# Patient Record
Sex: Female | Born: 1940 | Race: White | Hispanic: No | Smoking: Never smoker
Health system: Southern US, Community
[De-identification: ages and names within clinical notes are randomized; demographics above are authoritative.]

## PROBLEM LIST (undated history)

## (undated) DIAGNOSIS — Z953 Presence of xenogenic heart valve: Secondary | ICD-10-CM

## (undated) DIAGNOSIS — I482 Chronic atrial fibrillation, unspecified: Secondary | ICD-10-CM

## (undated) DIAGNOSIS — T883XXA Malignant hyperthermia due to anesthesia, initial encounter: Secondary | ICD-10-CM

## (undated) DIAGNOSIS — J449 Chronic obstructive pulmonary disease, unspecified: Secondary | ICD-10-CM

## (undated) DIAGNOSIS — I350 Nonrheumatic aortic (valve) stenosis: Secondary | ICD-10-CM

## (undated) DIAGNOSIS — Z0389 Encounter for observation for other suspected diseases and conditions ruled out: Secondary | ICD-10-CM

## (undated) DIAGNOSIS — IMO0001 Reserved for inherently not codable concepts without codable children: Secondary | ICD-10-CM

## (undated) DIAGNOSIS — Z8489 Family history of other specified conditions: Secondary | ICD-10-CM

## (undated) DIAGNOSIS — J189 Pneumonia, unspecified organism: Secondary | ICD-10-CM

## (undated) DIAGNOSIS — J90 Pleural effusion, not elsewhere classified: Secondary | ICD-10-CM

## (undated) DIAGNOSIS — I272 Pulmonary hypertension, unspecified: Secondary | ICD-10-CM

## (undated) DIAGNOSIS — D649 Anemia, unspecified: Secondary | ICD-10-CM

## (undated) DIAGNOSIS — J3800 Paralysis of vocal cords and larynx, unspecified: Secondary | ICD-10-CM

## (undated) DIAGNOSIS — N179 Acute kidney failure, unspecified: Secondary | ICD-10-CM

## (undated) DIAGNOSIS — M1990 Unspecified osteoarthritis, unspecified site: Secondary | ICD-10-CM

## (undated) DIAGNOSIS — Z8709 Personal history of other diseases of the respiratory system: Secondary | ICD-10-CM

## (undated) DIAGNOSIS — I5032 Chronic diastolic (congestive) heart failure: Secondary | ICD-10-CM

## (undated) DIAGNOSIS — J398 Other specified diseases of upper respiratory tract: Secondary | ICD-10-CM

## (undated) HISTORY — PX: OTHER SURGICAL HISTORY: SHX169

---

## 2012-08-22 ENCOUNTER — Encounter (HOSPITAL_COMMUNITY): Payer: Self-pay

## 2012-08-22 ENCOUNTER — Emergency Department (HOSPITAL_COMMUNITY): Payer: Medicare Other

## 2012-08-22 ENCOUNTER — Inpatient Hospital Stay (HOSPITAL_COMMUNITY)
Admission: EM | Admit: 2012-08-22 | Discharge: 2012-08-27 | DRG: 291 | Disposition: A | Discharge status: Home / Self Care | Payer: Medicare Other | Attending: Internal Medicine | Admitting: Internal Medicine

## 2012-08-22 DIAGNOSIS — E669 Obesity, unspecified: Secondary | ICD-10-CM

## 2012-08-22 DIAGNOSIS — Z6841 Body Mass Index (BMI) 40.0 and over, adult: Secondary | ICD-10-CM

## 2012-08-22 DIAGNOSIS — R0600 Dyspnea, unspecified: Secondary | ICD-10-CM | POA: Diagnosis present

## 2012-08-22 DIAGNOSIS — J111 Influenza due to unidentified influenza virus with other respiratory manifestations: Secondary | ICD-10-CM | POA: Diagnosis present

## 2012-08-22 DIAGNOSIS — I509 Heart failure, unspecified: Secondary | ICD-10-CM | POA: Diagnosis present

## 2012-08-22 DIAGNOSIS — J019 Acute sinusitis, unspecified: Secondary | ICD-10-CM | POA: Diagnosis present

## 2012-08-22 DIAGNOSIS — I5031 Acute diastolic (congestive) heart failure: Principal | ICD-10-CM | POA: Diagnosis present

## 2012-08-22 DIAGNOSIS — I359 Nonrheumatic aortic valve disorder, unspecified: Secondary | ICD-10-CM | POA: Diagnosis present

## 2012-08-22 DIAGNOSIS — M069 Rheumatoid arthritis, unspecified: Secondary | ICD-10-CM | POA: Diagnosis present

## 2012-08-22 DIAGNOSIS — I2789 Other specified pulmonary heart diseases: Secondary | ICD-10-CM | POA: Diagnosis present

## 2012-08-22 DIAGNOSIS — J189 Pneumonia, unspecified organism: Secondary | ICD-10-CM

## 2012-08-22 DIAGNOSIS — J40 Bronchitis, not specified as acute or chronic: Secondary | ICD-10-CM | POA: Diagnosis present

## 2012-08-22 DIAGNOSIS — I517 Cardiomegaly: Secondary | ICD-10-CM

## 2012-08-22 DIAGNOSIS — I350 Nonrheumatic aortic (valve) stenosis: Secondary | ICD-10-CM

## 2012-08-22 DIAGNOSIS — J9801 Acute bronchospasm: Secondary | ICD-10-CM | POA: Diagnosis present

## 2012-08-22 DIAGNOSIS — I4891 Unspecified atrial fibrillation: Secondary | ICD-10-CM | POA: Diagnosis present

## 2012-08-22 DIAGNOSIS — I272 Pulmonary hypertension, unspecified: Secondary | ICD-10-CM

## 2012-08-22 HISTORY — DX: Unspecified osteoarthritis, unspecified site: M19.90

## 2012-08-22 LAB — BASIC METABOLIC PANEL
BUN: 10 mg/dL (ref 6–23)
CO2: 28 mEq/L (ref 19–32)
Calcium: 9.3 mg/dL (ref 8.4–10.5)
Chloride: 100 mEq/L (ref 96–112)
Creatinine, Ser: 0.65 mg/dL (ref 0.50–1.10)
GFR calc Af Amer: >90 mL/min (ref >90)
GFR calc non Af Amer: 88 mL/min — ABNORMAL LOW (ref >90)
Glucose, Bld: 149 mg/dL — ABNORMAL HIGH (ref 70–99)
Potassium: 3.6 mEq/L (ref 3.5–5.1)
Sodium: 138 mEq/L (ref 135–145)

## 2012-08-22 LAB — CBC WITH DIFFERENTIAL/PLATELET
Basophils Absolute: 0 10*3/uL (ref 0.0–0.1)
Basophils Relative: 0 % (ref 0–1)
Eosinophils Absolute: 0 10*3/uL (ref 0.0–0.7)
Eosinophils Relative: 0 % (ref 0–5)
HCT: 34.9 % — ABNORMAL LOW (ref 36.0–46.0)
Hemoglobin: 11.2 g/dL — ABNORMAL LOW (ref 12.0–15.0)
Lymphocytes Relative: 9 % — ABNORMAL LOW (ref 12–46)
Lymphs Abs: 0.7 10*3/uL (ref 0.7–4.0)
MCH: 29.6 pg (ref 26.0–34.0)
MCHC: 32.1 g/dL (ref 30.0–36.0)
MCV: 92.3 fL (ref 78.0–100.0)
Monocytes Absolute: 0.7 10*3/uL (ref 0.1–1.0)
Monocytes Relative: 9 % (ref 3–12)
Neutro Abs: 6.5 10*3/uL (ref 1.7–7.7)
Neutrophils Relative %: 81 % — ABNORMAL HIGH (ref 43–77)
Platelets: 168 10*3/uL (ref 150–400)
RBC: 3.78 MIL/uL — ABNORMAL LOW (ref 3.87–5.11)
RDW: 14.7 % (ref 11.5–15.5)
WBC: 8 10*3/uL (ref 4.0–10.5)

## 2012-08-22 LAB — TSH: TSH: 0.916 u[IU]/mL (ref 0.350–4.500)

## 2012-08-22 LAB — INFLUENZA PANEL BY PCR (TYPE A & B)
H1N1 flu by pcr: DETECTED — AB
Influenza A By PCR: POSITIVE — AB

## 2012-08-22 MED ORDER — ENOXAPARIN SODIUM 40 MG/0.4ML ~~LOC~~ SOLN
40.0000 mg | SUBCUTANEOUS | Status: DC
  Administered 2012-08-22 – 2012-08-23 (×2): 40 mg via SUBCUTANEOUS
  Filled 2012-08-22 (×2): qty 0.4

## 2012-08-22 MED ORDER — FUROSEMIDE 10 MG/ML IJ SOLN
40.0000 mg | Freq: Two times a day (BID) | INTRAMUSCULAR | Status: DC
  Administered 2012-08-22 – 2012-08-24 (×4): 40 mg via INTRAVENOUS
  Filled 2012-08-22 (×4): qty 4

## 2012-08-22 MED ORDER — SODIUM CHLORIDE 0.9 % IJ SOLN
3.0000 mL | Freq: Two times a day (BID) | INTRAMUSCULAR | Status: DC
  Administered 2012-08-22 – 2012-08-26 (×9): 3 mL via INTRAVENOUS
  Filled 2012-08-22: qty 3

## 2012-08-22 MED ORDER — POTASSIUM CHLORIDE CRYS ER 20 MEQ PO TBCR
40.0000 meq | EXTENDED_RELEASE_TABLET | Freq: Every day | ORAL | Status: DC
  Administered 2012-08-22 – 2012-08-27 (×6): 40 meq via ORAL
  Filled 2012-08-22 (×6): qty 2

## 2012-08-22 MED ORDER — ACETAMINOPHEN 325 MG PO TABS: 650.0000 mg | ORAL_TABLET | Freq: Four times a day (QID) | ORAL | Status: DC | PRN

## 2012-08-22 MED ORDER — LEVOFLOXACIN 500 MG PO TABS
500.0000 mg | ORAL_TABLET | Freq: Every day | ORAL | Status: DC
  Administered 2012-08-22 – 2012-08-26 (×5): 500 mg via ORAL
  Filled 2012-08-22 (×5): qty 1

## 2012-08-22 MED ORDER — ACETAMINOPHEN 650 MG RE SUPP: 650.0000 mg | Freq: Four times a day (QID) | RECTAL | Status: DC | PRN

## 2012-08-22 MED ORDER — FLUTICASONE PROPIONATE 50 MCG/ACT NA SUSP
2.0000 | Freq: Every day | NASAL | Status: DC
  Administered 2012-08-22 – 2012-08-27 (×6): 2 via NASAL
  Filled 2012-08-22: qty 16

## 2012-08-22 MED ORDER — IPRATROPIUM BROMIDE 0.02 % IN SOLN
0.5000 mg | Freq: Once | RESPIRATORY_TRACT | Status: AC
  Administered 2012-08-22: 0.5 mg via RESPIRATORY_TRACT
  Filled 2012-08-22: qty 2.5

## 2012-08-22 MED ORDER — PREDNISONE 50 MG PO TABS
60.0000 mg | ORAL_TABLET | Freq: Once | ORAL | Status: AC
  Administered 2012-08-22: 60 mg via ORAL
  Filled 2012-08-22: qty 1

## 2012-08-22 MED ORDER — FUROSEMIDE 10 MG/ML IJ SOLN
40.0000 mg | Freq: Once | INTRAMUSCULAR | Status: AC
  Administered 2012-08-22: 40 mg via INTRAVENOUS
  Filled 2012-08-22: qty 4

## 2012-08-22 MED ORDER — SENNOSIDES-DOCUSATE SODIUM 8.6-50 MG PO TABS: 1.0000 | ORAL_TABLET | Freq: Every evening | ORAL | Status: DC | PRN

## 2012-08-22 MED ORDER — TRAZODONE HCL 50 MG PO TABS
50.0000 mg | ORAL_TABLET | Freq: Every evening | ORAL | Status: DC | PRN
  Administered 2012-08-24: 50 mg via ORAL
  Filled 2012-08-22 (×3): qty 1

## 2012-08-22 MED ORDER — NITROGLYCERIN 2 % TD OINT
1.0000 [in_us] | TOPICAL_OINTMENT | Freq: Once | TRANSDERMAL | Status: AC
  Administered 2012-08-22: 1 [in_us] via TOPICAL
  Filled 2012-08-22: qty 1

## 2012-08-22 MED ORDER — PREDNISONE 20 MG PO TABS
40.0000 mg | ORAL_TABLET | Freq: Two times a day (BID) | ORAL | Status: DC
  Administered 2012-08-22 – 2012-08-27 (×11): 40 mg via ORAL
  Filled 2012-08-22 (×11): qty 2

## 2012-08-22 MED ORDER — ASPIRIN 81 MG PO CHEW
324.0000 mg | CHEWABLE_TABLET | Freq: Once | ORAL | Status: AC
  Administered 2012-08-22: 324 mg via ORAL
  Filled 2012-08-22: qty 4

## 2012-08-22 MED ORDER — OSELTAMIVIR PHOSPHATE 75 MG PO CAPS
75.0000 mg | ORAL_CAPSULE | Freq: Two times a day (BID) | ORAL | Status: AC
  Administered 2012-08-22 – 2012-08-27 (×10): 75 mg via ORAL
  Filled 2012-08-22 (×10): qty 1

## 2012-08-22 MED ORDER — BIOTENE DRY MOUTH MT LIQD
15.0000 mL | Freq: Two times a day (BID) | OROMUCOSAL | Status: DC
  Administered 2012-08-22 – 2012-08-26 (×7): 15 mL via OROMUCOSAL

## 2012-08-22 MED ORDER — ONDANSETRON HCL 4 MG PO TABS: 4.0000 mg | ORAL_TABLET | Freq: Four times a day (QID) | ORAL | Status: DC | PRN

## 2012-08-22 MED ORDER — LEVALBUTEROL HCL 0.63 MG/3ML IN NEBU
0.6300 mg | INHALATION_SOLUTION | RESPIRATORY_TRACT | Status: DC
  Administered 2012-08-22 – 2012-08-27 (×24): 0.63 mg via RESPIRATORY_TRACT
  Filled 2012-08-22 (×24): qty 3

## 2012-08-22 MED ORDER — ALBUTEROL SULFATE (5 MG/ML) 0.5% IN NEBU
5.0000 mg | INHALATION_SOLUTION | Freq: Once | RESPIRATORY_TRACT | Status: AC
  Administered 2012-08-22: 5 mg via RESPIRATORY_TRACT
  Filled 2012-08-22: qty 1

## 2012-08-22 MED ORDER — DM-GUAIFENESIN ER 30-600 MG PO TB12
1.0000 | ORAL_TABLET | Freq: Two times a day (BID) | ORAL | Status: DC
  Administered 2012-08-22 – 2012-08-27 (×10): 1 via ORAL
  Filled 2012-08-22 (×10): qty 1

## 2012-08-22 MED ORDER — ONDANSETRON HCL 4 MG/2ML IJ SOLN: 4.0000 mg | Freq: Four times a day (QID) | INTRAMUSCULAR | Status: DC | PRN

## 2012-08-22 NOTE — ED Provider Notes (Signed)
History  This chart was scribed for Sandra Gaskins, MD by Ardeen Jourdain, ED Scribe. This patient was seen in room APA04/APA04 and the patient's care was started at 0731.  CSN: [id number]  Arrival date & time 08/22/12  0725   First MD Initiated Contact with Patient 08/22/12 [phone]      Chief Complaint  Patient presents with  . Shortness of Breath     Patient is a 71 y.o. female presenting with shortness of breath. The history is provided by the patient and the EMS personnel. No language interpreter was used.  Shortness of Breath  The current episode started more than 1 week ago. The problem occurs frequently. The problem has been gradually worsening. The symptoms are relieved by rest. The symptoms are aggravated by activity. Associated symptoms include a fever, cough, shortness of breath and wheezing. Pertinent negatives include no chest pain and no chest pressure. Her temperature was unmeasured prior to arrival. The cough has no precipitants. The cough is productive. Nothing relieves the cough. Nothing worsens the cough. She was not exposed to toxic fumes. She has not inhaled smoke recently. She has had no prior steroid use. She has been behaving normally. Urine output has been normal. There were no sick contacts. She has received no recent medical care.   Sandra Turner is a 71 y.o. female brought in by ambulance, who presents to the Emergency Department complaining of gradually worsening SOB. She states she was at work yesterday when she noticed her cough and SOB begin to worsen. She states the cough started over 1 week ago for which she was seen by her PCP and was given albuterol and Azithromycin.   PMH - none  History reviewed. No pertinent past surgical history.  No family history on file.  History  Substance Use Topics  . Smoking status: Never Smoker   . Smokeless tobacco: Not on file  . Alcohol Use: No    OB History    Grav Para Term Preterm Abortions TAB SAB Ect Mult  Living                  Review of Systems  Constitutional: Positive for fever. Negative for diaphoresis and activity change.  Respiratory: Positive for cough, shortness of breath and wheezing.   Cardiovascular: Positive for leg swelling. Negative for chest pain.  Gastrointestinal: Negative for nausea, vomiting, abdominal pain and diarrhea.  All other systems reviewed and are negative.    Allergies  Penicillins  Home Medications  No current outpatient prescriptions on file.  Triage Vitals: BP 159/61  Pulse 111  Temp 98.6 F (37 C) (Oral)  Resp 20  Ht 5\' 2"  (1.575 m)  Wt 250 lb (113.399 kg)  BMI 45.73 kg/m2  SpO2 96%  Physical Exam  CONSTITUTIONAL: Well developed/well nourished HEAD AND FACE: Normocephalic/atraumatic EYES: EOMI/PERRL ENMT: Mucous membranes moist NECK: supple no meningeal signs SPINE:entire spine nontender CV: S1/S2 noted pitting edema bilaterally to lower extremities, systolic murmur noted LUNGS: Wheezing bilaterally, no apparent distress ABDOMEN: soft, nontender, no rebound or guarding GU:no cva tenderness NEURO: Pt is awake/alert, moves all extremitiesx4 EXTREMITIES: pulses normal, full ROM, LE edema noted SKIN: warm, color normal PSYCH: no abnormalities of mood noted  ED Course  Procedures   DIAGNOSTIC STUDIES: Oxygen Saturation is 96% on [place], adequate by my interpretation.    COORDINATION OF CARE:  7:44 AM: Discussed treatment plan which includes a CXR and breathing treatment with pt at bedside and pt agreed to plan.  8:00  AM: Medication Orders: albuterol (PROVENTIL) (5 MG/ML) 0.5% nebulizer solution 5 mg Once, ipratropium (ATROVENT) nebulizer solution 0.5 mg Once, predniSONE (DELTASONE) tablet 60 mg Once   8:25 AM CXR concerning for possible CHF.  She is short of breath.  bnp ordered.  NTG ordered as well 9:37 AM D/w dr Tenny Craw.  She feels patient can be kept at [employer], does not emergent cardiac cath for new onset CHF Pt stabilized in  the ED though still mildly tachypneic  D/w dr Lendell Caprice, to admit to tele   MDM  Nursing notes including past medical history and social history reviewed and considered in documentation Labs/vital reviewed and considered xrays reviewed and considered     Date: 08/22/2012  Rate: 90  Rhythm: atrial fibrillation  QRS Axis: left  Intervals: normal  ST/T Wave abnormalities: nonspecific ST changes  Conduction Disutrbances:none  Narrative Interpretation:   Old EKG Reviewed: none available at time of interpretation     I personally performed the services described in this documentation, which was scribed in my presence. The recorded information has been reviewed and is accurate.      Sandra Gaskins, MD 08/22/12 [phone]

## 2012-08-22 NOTE — Progress Notes (Signed)
Positive Flu screen, on call MD contacted.  New orders received.

## 2012-08-22 NOTE — Progress Notes (Signed)
*  PRELIMINARY RESULTS* Echocardiogram 2D Echocardiogram has been performed.  Conrad [place] 08/22/2012, 2:23 PM

## 2012-08-22 NOTE — ED Notes (Signed)
Pt via EMS with complaints of SOB since the previous night. 2 neb treatments given by EMS 1 albuterol and 1 duo neb. IV started to left hand.

## 2012-08-22 NOTE — H&P (Signed)
Hospital Admission Note Date: 08/22/2012  Patient name: Sandra Turner Medical record number: [id number] Date of birth: 1941/03/01 Age: 71 y.o. Gender: female PCP: [name], MD  Attending physician: Christiane Ha, MD  Chief Complaint: Shortness of breath  History of Present Illness:  Sandra Turner is an 71 y.o. female, previously healthy who presents with shortness of breath for about a week. Yesterday, she developed a cough productive of clear sputum. She has had maxillary sinus pressure, postnasal drip. She has had a backache and headache. No fevers or chills. She has no change in chronic peripheral edema. She has no orthopnea, or PND. The dyspnea is mainly exertional. She was given an inhaler last week which has not helped her symptoms. In the emergency room, chest x-ray showed cardiomegaly and possible pulmonary edema. Pro BNP was high. EKG showed atrial fibrillation. She was given steroids, bronchodilators, Lasix and aspirin. She has had similar symptoms with bronchitis in the past. No previous history of congestive heart failure or atrial fibrillation. No chest pains. Feels palpitations only with exertion.  History reviewed. No pertinent past medical history.  Meds: Prescriptions prior to admission  Medication Sig Dispense Refill  . Aspirin-Salicylamide-Caffeine (BC HEADACHE POWDER PO) Take 1 packet by mouth every 4 (four) hours as needed. Pain      . OVER THE COUNTER MEDICATION Take 1 tablet by mouth daily. Patient states that she takes about 30 multivitamins and herbal supplements.        Allergies: Penicillins and Latex History   Social History  . Marital Status: Divorced    Spouse Name: N/A    Number of Children: N/A  . Years of Education: N/A   Occupational History  . Not on file.   Social History Main Topics  . Smoking status: Never Smoker   . Smokeless tobacco: Not on file  . Alcohol Use: No  . Drug Use:   . Sexually Active:    Other Topics Concern  . Not  on file   Social History Narrative  . No narrative on file   Family history: Mother had congestive heart failure. Father had metastatic squamous cell carcinoma of the skin.   History reviewed. No pertinent past surgical history.  Review of Systems: Systems reviewed and as per HPI, otherwise negative.  Physical Exam: Blood pressure 124/58, pulse 83, temperature 98.6 F (37 C), temperature source Oral, resp. rate 22, height 5\' 2"  (1.575 m), weight 113.399 kg (250 lb), SpO2 93.00%. BP 124/58  Pulse 83  Temp 98.6 F (37 C) (Oral)  Resp 22  Ht 5\' 2"  (1.575 m)  Wt 113.399 kg (250 lb)  BMI 45.73 kg/m2  SpO2 93%  General Appearance:    Alert, cooperative, occasional wet cough. Eating a sandwich and watching TV.   Head:    Normocephalic, without obvious abnormality, atraumatic  Eyes:    PERRL, conjunctiva/corneas clear, EOM's intact, fundi    benign, both eyes  Ears:    Normal TM's and external ear canals, both ears  Nose:   Nares normal, septum midline, mucosa normal, no drainage    maxillary sinuses are tender.   Throat:   Lips, mucosa, and tongue normal; teeth and gums normal  Neck:   Supple, symmetrical, trachea midline, no adenopathy;    thyroid:  no enlargement/tenderness/nodules; no carotid   bruit or JVD  Back:     Symmetric, no curvature, ROM normal, no CVA tenderness  Lungs:     diminished throughout with prolonged expiratory phase, slight wheeze. No  rales. Occasional rhonchi.   Chest Wall:    No tenderness or deformity   Heart:    Regular rate and rhythm, S1 and S2 normal, no murmur, rub   or gallop     Abdomen:     Soft, non-tender, bowel sounds active all four quadrants,    no masses, no organomegaly. obese   Genitalia:   deferred   Rectal:   deferred   Extremities:   Extremities normal, atraumatic, no cyanosis.3+ pitting edema   Pulses:   2+ and symmetric all extremities  Skin:   Skin color, texture, turgor normal, no rashes or lesions  Lymph nodes:   Cervical,  supraclavicular, and axillary nodes normal  Neurologic:   CNII-XII intact, normal strength, sensation and reflexes    throughout    Psychiatric: Normal affect.  Lab results: Basic Metabolic Panel:  Basename 08/22/12 0720  NA 138  K 3.6  CL 100  CO2 28  GLUCOSE 149*  BUN 10  CREATININE 0.65  CALCIUM 9.3  MG --  PHOS --   Liver Function Tests: No results found for this basename: AST:2,ALT:2,ALKPHOS:2,BILITOT:2,PROT:2,ALBUMIN:2 in the last 72 hours No results found for this basename: LIPASE:2,AMYLASE:2 in the last 72 hours No results found for this basename: AMMONIA:2 in the last 72 hours CBC:  Basename 08/22/12 0720  WBC 8.0  NEUTROABS 6.5  HGB 11.2*  HCT 34.9*  MCV 92.3  PLT 168   Cardiac Enzymes:  Basename 08/22/12 0720  CKTOTAL --  CKMB --  CKMBINDEX --  TROPONINI <0.30   BNP:  [facility] 08/22/12 0720  PROBNP 1936.0*   Imaging results:  Dg Chest Port 1 View  08/22/2012  *RADIOLOGY REPORT*  Clinical Data: Cough, congestion, wheezing  PORTABLE CHEST - 1 VIEW  Comparison: Portable exam 0752 hours without priors for comparison  Findings: Enlargement of cardiac silhouette with pulmonary vascular congestion. Atherosclerotic calcification of a mildly tortuous thoracic aorta. Hilar enlargement bilaterally question adenopathy. Minimal subsegmental atelectasis at lingula. Lungs otherwise clear. No pleural effusion or pneumothorax. Bones demineralized with bilateral glenohumeral degenerative changes.  IMPRESSION: Enlargement of cardiac silhouette with pulmonary vascular congestion. Hilar enlargement bilaterally question adenopathy; recommend CT chest with contrast to evaluate. Minimal subsegmental atelectasis at lingula.   Original Report Authenticated By: Ulyses Southward, M.D.    RUE:AVWUJW fibrillation Left axis deviation Low voltage QRS  Assessment & Plan: Principal Problem:  *Dyspnea, likely a combination of acute bronchitis versus atypical pneumonia with bronchospasm  and new heart failure. Echocardiogram is pending. Will continue steroids, Lasix, check TSH, give levofloxacin. Give Xopenex. Mucinex. Daily weights. Monitor intake and output. Active Problems:  Bronchospasm: See above. Patient has no history of smoking.  Atrial fibrillation, new, rate controlled.. Chest score is 1. No need for anticoagulation at this point.  Acute sinusitis: Add Flonase  Obesity  Lakena Sparlin L 08/22/2012, 2:22 PM

## 2012-08-23 DIAGNOSIS — R0989 Other specified symptoms and signs involving the circulatory and respiratory systems: Secondary | ICD-10-CM

## 2012-08-23 DIAGNOSIS — R0609 Other forms of dyspnea: Secondary | ICD-10-CM

## 2012-08-23 DIAGNOSIS — E669 Obesity, unspecified: Secondary | ICD-10-CM

## 2012-08-23 LAB — GLUCOSE, CAPILLARY: Glucose-Capillary: 129 mg/dL — ABNORMAL HIGH (ref 70–99)

## 2012-08-23 LAB — BASIC METABOLIC PANEL
CO2: 31 mEq/L (ref 19–32)
Calcium: 9.3 mg/dL (ref 8.4–10.5)
GFR calc non Af Amer: 68 mL/min — ABNORMAL LOW (ref >90)
Glucose, Bld: 140 mg/dL — ABNORMAL HIGH (ref 70–99)
Potassium: 4.1 mEq/L (ref 3.5–5.1)
Sodium: 136 mEq/L (ref 135–145)

## 2012-08-23 NOTE — Progress Notes (Signed)
     Subjective: This lady feels somewhat better. Her breathing is easier. She is positive for influenza.           Physical Exam: Blood pressure 130/66, pulse 80, temperature 97.5 F (36.4 C), temperature source Oral, resp. rate 20, height 5\' 2"  (1.575 m), weight 120 kg (264 lb 8.8 oz), SpO2 97.00%. She looks like she is dyspneic at rest. She has audible wheezing from the bedside. Lung fields clearly show wheezing but just is not very tight. There are no crackles. There is no bronchial breathing. Heart sounds are present without aortic stenotic murmur. Jugular venous pressure is not particularly raised. She does have peripheral pitting edema. She is alert and orientated.   Investigations:     Basic Metabolic Panel:  Basename 08/23/12 0528 08/22/12 0720  NA 136 138  K 4.1 3.6  CL 98 100  CO2 31 28  GLUCOSE 140* 149*  BUN 17 10  CREATININE 0.85 0.65  CALCIUM 9.3 9.3  MG -- --  PHOS -- --       CBC:  Basename 08/22/12 0720  WBC 8.0  NEUTROABS 6.5  HGB 11.2*  HCT 34.9*  MCV 92.3  PLT 168    Dg Chest Port 1 View  08/22/2012  *RADIOLOGY REPORT*  Clinical Data: Cough, congestion, wheezing  PORTABLE CHEST - 1 VIEW  Comparison: Portable exam 0752 hours without priors for comparison  Findings: Enlargement of cardiac silhouette with pulmonary vascular congestion. Atherosclerotic calcification of a mildly tortuous thoracic aorta. Hilar enlargement bilaterally question adenopathy. Minimal subsegmental atelectasis at lingula. Lungs otherwise clear. No pleural effusion or pneumothorax. Bones demineralized with bilateral glenohumeral degenerative changes.  IMPRESSION: Enlargement of cardiac silhouette with pulmonary vascular congestion. Hilar enlargement bilaterally question adenopathy; recommend CT chest with contrast to evaluate. Minimal subsegmental atelectasis at lingula.   Original Report Authenticated By: Ulyses Southward, M.D.       Medications: I have reviewed the  patient's current medications.  Impression: 1. Influenza A. 2. Congestive heart failure, diastolic. 3. Moderate aortic stenosis. 4. Bronchitis. 5. Obesity. 6. Atrial fibrillation. No need for anticoagulation at this point.     Plan: 1. Continue with current therapy including intravenous Lasix, steroids, antibiotics. 2. She will need cardiology consultation after the weekend.     LOS: 1 day   Wilson Singer Pager [phone]  08/23/2012, 9:42 AM

## 2012-08-24 ENCOUNTER — Inpatient Hospital Stay (HOSPITAL_COMMUNITY): Payer: Medicare Other

## 2012-08-24 LAB — COMPREHENSIVE METABOLIC PANEL
AST: 43 U/L — ABNORMAL HIGH (ref 0–37)
BUN: 21 mg/dL (ref 6–23)
CO2: 32 mEq/L (ref 19–32)
Chloride: 96 mEq/L (ref 96–112)
Creatinine, Ser: 0.7 mg/dL (ref 0.50–1.10)
GFR calc non Af Amer: 86 mL/min — ABNORMAL LOW (ref >90)
Total Bilirubin: 0.5 mg/dL (ref 0.3–1.2)

## 2012-08-24 LAB — CBC
HCT: 37.5 % (ref 36.0–46.0)
Hemoglobin: 12.1 g/dL (ref 12.0–15.0)
MCV: 92.1 fL (ref 78.0–100.0)
RBC: 4.07 MIL/uL (ref 3.87–5.11)
WBC: 7.9 10*3/uL (ref 4.0–10.5)

## 2012-08-24 LAB — GLUCOSE, CAPILLARY: Glucose-Capillary: 126 mg/dL — ABNORMAL HIGH (ref 70–99)

## 2012-08-24 MED ORDER — FUROSEMIDE 10 MG/ML IJ SOLN
80.0000 mg | Freq: Three times a day (TID) | INTRAMUSCULAR | Status: DC
  Administered 2012-08-24 – 2012-08-26 (×6): 80 mg via INTRAVENOUS
  Filled 2012-08-24 (×6): qty 8

## 2012-08-24 MED ORDER — DILTIAZEM HCL 30 MG PO TABS
30.0000 mg | ORAL_TABLET | Freq: Two times a day (BID) | ORAL | Status: DC
  Administered 2012-08-24 (×2): 30 mg via ORAL
  Filled 2012-08-24 (×2): qty 1

## 2012-08-24 MED ORDER — ENOXAPARIN SODIUM 120 MG/0.8ML ~~LOC~~ SOLN
1.0000 mg/kg | Freq: Two times a day (BID) | SUBCUTANEOUS | Status: DC
  Administered 2012-08-24 – 2012-08-26 (×4): 120 mg via SUBCUTANEOUS
  Filled 2012-08-24 (×7): qty 0.8

## 2012-08-24 NOTE — Progress Notes (Addendum)
     Subjective: This lady feels somewhat better. Her breathing is easier. She is positive for influenza. She is coughing purulent sputum. She has not lost any significant weight whatsoever.           Physical Exam: Blood pressure 128/74, pulse 80, temperature 99 F (37.2 C), temperature source Oral, resp. rate 21, height 5\' 2"  (1.575 m), weight 120.2 kg (264 lb 15.9 oz), SpO2 96.00%. She looks like she is dyspneic at rest. She has audible wheezing from the bedside. Lung fields clearly show wheezing but  is not very tight. There are no crackles. There is no bronchial breathing. Heart sounds are present and clinically in atrial fibrillation with aortic stenotic murmur. Jugular venous pressure is not particularly raised. She does have peripheral pitting edema. She is alert and orientated.   Investigations:     Basic Metabolic Panel:  Basename 08/24/12 0635 08/23/12 0528  NA 135 136  K 4.2 4.1  CL 96 98  CO2 32 31  GLUCOSE 127* 140*  BUN 21 17  CREATININE 0.70 0.85  CALCIUM 9.4 9.3  MG -- --  PHOS -- --       CBC:  Basename 08/24/12 0635 08/22/12 0720  WBC 7.9 8.0  NEUTROABS -- 6.5  HGB 12.1 11.2*  HCT 37.5 34.9*  MCV 92.1 92.3  PLT 190 168    Dg Chest 1 View  08/24/2012  *RADIOLOGY REPORT*  Clinical Data: Follow up pulmonary vascular congestion.  CHEST - 1 VIEW  Comparison: Portable chest x-ray 08/24/2012.  Findings: Cardiac silhouette moderately enlarged but stable. Interval improvement in the pulmonary venous hypertension. Developing patchy airspace opacities medially at the right lung base.  Lungs otherwise clear.  IMPRESSION: Resolution of pulmonary venous hypertension.  Developing bronchopneumonia suspected at the right lung base.   Original Report Authenticated By: Hulan Saas, M.D.       Medications: I have reviewed the patient's current medications.  Impression: 1. Influenza A. 2. Acute diastolic congestive heart failure. 3. Moderate aortic  stenosis. 4. Bronchitis. 5. Obesity. 6. Atrial fibrillation.     Plan: 1. Increase Lasix to 80 mg 3 times a day. Start therapeutic dose of Lovenox for persistent atrial fibrillation. 2. She will need cardiology consultation after the weekend. 3. Continue with oral steroids and antibiotics and bronchodilators.     LOS: 2 days   Wilson Singer Pager [phone]  08/24/2012, 10:41 AM

## 2012-08-24 NOTE — Clinical Documentation Improvement (Signed)
CHF DOCUMENTATION CLARIFICATION QUERY  THIS DOCUMENT IS NOT A PERMANENT PART OF THE MEDICAL RECORD  TO RESPOND TO THE THIS QUERY, FOLLOW THE INSTRUCTIONS BELOW:  1. If needed, update documentation for the patient's encounter via the notes activity.  2. Access this query again and click edit on the In [employer].  3. After updating, or not, click F2 to complete all highlighted (required) fields concerning your review. Select "additional documentation in the medical record" OR "no additional documentation provided".  4. Click Sign note button.  5. The deficiency will fall out of your In Basket *Please let us know if you are not able to complete this workflow by phone or e-mail (listed below).  Please update your documentation within the medical record to reflect your response to this query.                                                                                    08/24/12  Dear Dr. [facility],  In a better effort to capture your patient's severity of illness, reflect appropriate length of stay and utilization of resources, a review of the patient medical record has revealed the following indicators the diagnosis of Heart Failure.  Based on your clinical judgment, please clarify and document in a progress note and/or discharge summary the clinical condition associated with the following supporting information:  In responding to this query please exercise your independent judgment.  The fact that a query is asked, does not imply that any particular answer is desired or expected.  Please clarify acuity of diagnosed Diastolic CHF  Possible Clinical Conditions?  Chronic Diastolic Congestive Heart Failure Acute Diastolic Congestive Heart Failure Acute Systolic & Diastolic Congestive Heart Failure Acute on Chronic Diastolic Congestive Heart Failure Other Condition________________________________________ Cannot Clinically Determine  Clinical Information:   Risk  Factors: Diagnosed with New onset Diastolic CHF  Signs & Symptoms: Peripheral edema SOB  Diagnostics: BNP: 1935  Echo results: EF: 55-60%  EKG: A Fib  Radiology: CXR- IMPRESSION: Enlargement of cardiac silhouette with pulmonary vascular congestion.  Treatment: Diuretics: IV Lasix 80mg  tid after meals Cardiac monitoring Daily weights  Reviewed: additional documentation in the medical record  Thank You,  Debora T Williams RN, MSN Clinical Documentation Specialist: Office# [phone] [facility] Information [facility] [place]

## 2012-08-24 NOTE — Progress Notes (Signed)
ANTICOAGULATION CONSULT NOTE - Initial Consult  Pharmacy Consult for Lovenox Indication: atrial fibrillation  Allergies  Allergen Reactions  . Penicillins Other (See Comments)    Unknown  . Latex Rash    Patient Measurements: Height: 5\' 2"  (157.5 cm) Weight: 264 lb 15.9 oz (120.2 kg) IBW/kg (Calculated) : 50.1   Vital Signs: Temp: 99 F (37.2 C) (12/22 0535) Temp src: Oral (12/22 0535) BP: 128/74 mmHg (12/22 0535) Pulse Rate: 80  (12/22 0703)  Labs:  Basename 08/24/12 1610 08/23/12 0528 08/22/12 0720  HGB 12.1 -- 11.2*  HCT 37.5 -- 34.9*  PLT 190 -- 168  APTT -- -- --  LABPROT -- -- --  INR -- -- --  HEPARINUNFRC -- -- --  CREATININE 0.70 0.85 0.65  CKTOTAL -- -- --  CKMB -- -- --  TROPONINI -- -- <0.30    Estimated Creatinine Clearance: 80.7 ml/min (by C-G formula based on Cr of 0.7).   Medical History: History reviewed. No pertinent past medical history.  Medications:  Scheduled:    . antiseptic oral rinse  15 mL Mouth Rinse BID  . dextromethorphan-guaiFENesin  1 tablet Oral BID  . diltiazem  30 mg Oral Q12H  . enoxaparin (LOVENOX) injection  1 mg/kg Subcutaneous Q12H  . fluticasone  2 spray Each Nare Daily  . furosemide  80 mg Intravenous TID PC  . levalbuterol  0.63 mg Nebulization Q4H while awake  . levofloxacin  500 mg Oral Daily  . oseltamivir  75 mg Oral BID  . potassium chloride  40 mEq Oral Daily  . predniSONE  40 mg Oral BID  . sodium chloride  3 mL Intravenous Q12H  . [DISCONTINUED] enoxaparin (LOVENOX) injection  40 mg Subcutaneous Q24H  . [DISCONTINUED] furosemide  40 mg Intravenous BID    Assessment: Okay for Protocol  Goal of Therapy:  Systemic Anticoagulation. Monitor platelets by anticoagulation protocol: Yes   Plan:  Lovenox 1mg /kg SQ every 12 hours. CBC every MWF. PT/INR with AM labs.  Lamonte Richer R 08/24/2012,10:51 AM

## 2012-08-25 ENCOUNTER — Encounter (HOSPITAL_COMMUNITY): Payer: Self-pay | Admitting: Adult Health

## 2012-08-25 DIAGNOSIS — I359 Nonrheumatic aortic valve disorder, unspecified: Secondary | ICD-10-CM

## 2012-08-25 DIAGNOSIS — I272 Pulmonary hypertension, unspecified: Secondary | ICD-10-CM

## 2012-08-25 DIAGNOSIS — I350 Nonrheumatic aortic (valve) stenosis: Secondary | ICD-10-CM

## 2012-08-25 DIAGNOSIS — I2789 Other specified pulmonary heart diseases: Secondary | ICD-10-CM

## 2012-08-25 LAB — CBC
MCV: 92.1 fL (ref 78.0–100.0)
Platelets: 204 10*3/uL (ref 150–400)
RBC: 4.04 MIL/uL (ref 3.87–5.11)
WBC: 6.1 10*3/uL (ref 4.0–10.5)

## 2012-08-25 LAB — BLOOD GAS, ARTERIAL
Acid-Base Excess: 13.7 mmol/L — ABNORMAL HIGH (ref 0.0–2.0)
Drawn by: 21310
O2 Content: 3 L/min
O2 Saturation: 96.4 %
Patient temperature: 37

## 2012-08-25 LAB — BASIC METABOLIC PANEL
BUN: 23 mg/dL (ref 6–23)
Chloride: 95 mEq/L — ABNORMAL LOW (ref 96–112)
GFR calc Af Amer: >90 mL/min (ref >90)
Potassium: 3.9 mEq/L (ref 3.5–5.1)

## 2012-08-25 LAB — D-DIMER, QUANTITATIVE: D-Dimer, Quant: 0.57 ug/mL-FEU — ABNORMAL HIGH (ref 0.00–0.48)

## 2012-08-25 LAB — GLUCOSE, CAPILLARY
Glucose-Capillary: 174 mg/dL — ABNORMAL HIGH (ref 70–99)
Glucose-Capillary: 140 mg/dL — ABNORMAL HIGH (ref 70–99)

## 2012-08-25 LAB — PROTIME-INR
INR: 1.21 (ref 0.00–1.49)
Prothrombin Time: 15.1 seconds (ref 11.6–15.2)

## 2012-08-25 MED ORDER — MONTELUKAST SODIUM 10 MG PO TABS
10.0000 mg | ORAL_TABLET | Freq: Every day | ORAL | Status: DC
  Administered 2012-08-25 – 2012-08-26 (×2): 10 mg via ORAL
  Filled 2012-08-25 (×2): qty 1

## 2012-08-25 MED ORDER — DILTIAZEM HCL 30 MG PO TABS
30.0000 mg | ORAL_TABLET | Freq: Four times a day (QID) | ORAL | Status: DC
  Administered 2012-08-25 – 2012-08-26 (×5): 30 mg via ORAL
  Filled 2012-08-25 (×5): qty 1

## 2012-08-25 NOTE — Care Management Note (Unsigned)
    Page 1 of 1   08/25/2012     2:35:16 PM   CARE MANAGEMENT NOTE 08/25/2012  Patient:  Sandra Turner, Sandra Turner   Account Number:  [id number]  Date Initiated:  08/25/2012  Documentation initiated by:  Sharrie Rothman  Subjective/Objective Assessment:   Pt admitted from home with CHF and flu. Pt lives alone. Pt is independent with ADL's.     Action/Plan:   No CM or HH needs noted.   Anticipated DC Date:  08/27/2012   Anticipated DC Plan:  HOME/SELF CARE      DC Planning Services  CM consult      Choice offered to / List presented to:             Status of service:  Completed, signed off Medicare Important Message given?   (If response is "NO", the following Medicare IM given date fields will be blank) Date Medicare IM given:   Date Additional Medicare IM given:    Discharge Disposition:    Per UR Regulation:    If discussed at Long Length of Stay Meetings, dates discussed:    Comments:  08/25/12 1430 Arlyss Queen, RN BSN CM

## 2012-08-25 NOTE — Consult Note (Signed)
CARDIOLOGY CONSULT NOTE  Patient ID: Sandra Turner MRN: [id number] DOB/AGE: 1941/01/28 71 y.o.  Admit date: 08/22/2012 Referring Physician: PTH-Gosrani Primary PhysicianBLUTH, Lyda Perone, MD Primary Cardiologist: New-Raiden Haydu Reason for Consultation:Atrial fibrillation. Pulmonary Edema Principal Problem:  *Dyspnea Active Problems:  Atrial fibrillation  Bronchospasm  Obesity  Acute sinusitis  HPI: Sandra Turner, is a  71 y/o morbidly obese female patient with no prior cardiac history. The patient has not see a physician regularly, but has recently been established with [facility] practice and seen by Mertha Baars PA. The patient was admitted with severe dyspnea, found to have possible pulmonary edema, and also atrial fibrillation. The patient states symptoms began just prior to Thanksgiving. She hurt her back and had been having trouble breathing ever since. Mostly taking deep breaths. She been followed by a chiropractor to assist with chronic back pain. Approximately 2 weeks ago her breathing issues worsen with wheezing, coughing, and dyspnea on exertion. She was seen at [facility] practice one week ago,  placed on inhaler and antibiotics. The patient states that the breathing status did not improve. She was noticing abdominal distention and tightness, unable to pull to walk 6 feet without having to stop secondary to significant dyspnea.    On arrival to [facility] ER, the patient's blood pressure was 159/61, heart rate irregular at 111 beats per minute, O2 sat 96% with temperature of 98.6.Pro-BNP was elevated at 1936.0 with CXR demonstrating pulmonary vascular congestion. EKG revealed atrial fibrillation with nonspecific changes anteriorly. Cardiac enzymes were negative., She was treated with albuterol and Atrovent nebulization, prednisone 60 mg by mouth, nitroglycerin ointment, aspirin, and furosemide 40 mg IV.    The patient has no previous history, has never been hospitalized, and states  that she takes no medications except for something for her arthritis. She is wearing a lot of copper bracelets, rings, and necklaces, which she states are helpful for her arthritis. She is unaware of any palpitations, heart racing, chest pain, denies dizziness, nausea or vomiting. Her main complaints are dyspnea on exertion and coughing.      Patient reports snoring intermittently, but no daytime somnolence.  Sleep apnea has never been raised as a possible diagnosis nor has she undergone a sleep study.      Review of systems complete and found to be negative unless listed above   Family History  Problem Relation Age of Onset  . Breast cancer Mother   . Congestive Heart Failure Mother   . Cancer Father     History   Social History  . Marital Status: Divorced    Spouse Name: N/A    Number of Children: N/A  . Years of Education: N/A   Occupational History  . Not on file.   Social History Main Topics  . Smoking status: Never Smoker   . Smokeless tobacco: Not on file  . Alcohol Use: No  . Drug Use: No  . Sexually Active: No   Other Topics Concern  . Not on file   Social History Narrative  . No narrative on file    Past Surgical History  Procedure Date  . None     Prescriptions prior to admission  Medication Sig Dispense Refill  . Aspirin-Salicylamide-Caffeine (BC HEADACHE POWDER PO) Take 1 packet by mouth every 4 (four) hours as needed. Pain      . OVER THE COUNTER MEDICATION Take 1 tablet by mouth daily. Patient states that she takes about 30 multivitamins and herbal supplements.  Physical Exam: Blood pressure 129/59, pulse 83, temperature 98.1 F (36.7 C), temperature source Oral, resp. rate 20, height 5\' 2"  (1.575 m), weight 264 lb 15.9 oz (120.2 kg), SpO2 87.00%. General: Well developed, well nourished, obese,  in no acute distress Head: Eyes PERRLA, No xanthomas.   Normal cephalic and atramatic  Lungs: marked inspiratory and expiratory  rales, wheezes, with  frequent coughing, nonproductive. Heart: HRIR S1 S2, 2/6 harsh systolic murmur,  Radiation to the carotids bilaterally. Heart sounds are obscured by lung sounds.  Pulses are 2+ & equal.  Positive bilateral carotid bruit. Neck obese, unable to evaluate JVD.  No abdominal bruits. No femoral bruits. Abdomen: Bowel sounds are positive, abdomen soft and non-tender without masses or                  Hernia's noted. Msk:  Back normal, normal gait. Normal strength and tone for age. Extremities: No clubbing, cyanosis, positive for  nonpitting pretibial edema.  DP +1 Neuro: Alert and oriented X 3. Psych:  Good affect, responds appropriately  Labs: Lab Results  Component Value Date   WBC 6.1 08/25/2012   HGB 11.9* 08/25/2012   HCT 37.2 08/25/2012   MCV 92.1 08/25/2012   PLT 204 08/25/2012    Lab 08/25/12 0523 08/24/12 0635  NA 139 --  K 3.9 --  CL 95* --  CO2 38* --  BUN 23 --  CREATININE 0.70 --  CALCIUM 9.2 --  PROT -- 7.3  BILITOT -- 0.5  ALKPHOS -- 104  ALT -- 38*  AST -- 43*  GLUCOSE 139* --   Lab Results  Component Value Date   TROPONINI <0.30 08/22/2012  BNP: 1460  Total I&O since admission: +1.3 L;  .Weight increased 7 kg on day one of hospitalization  Echocardiogram: 08/22/2012: borderline LVH, normally EF, diastolic septal flattening, moderate aortic stenosis, moderate LA enlargement, moderate pulmonary hypertension   Radiology: Dg Chest 1 View 08/24/2012 Cardiac silhouette moderately enlarged but stable.  Resolution of pulmonary venous hypertension.  Developing bronchopneumonia suspected at the right lung base.     EKG:  Atrial fibrillation 90 bpm; low voltage; markedly delayed R-wave progression; cannot rule out prior anterior MI   ASSESSMENT AND PLAN:   1. Atrial fibrillation:  Likely related to acute illness in the setting of bronchitis, dyspnea,and is now positive for influenza. . Echocardiogram has revealed that the left atrium was moderately dilated, and it is  unknown how long she has been in atrial fibrillation: CHADS score is 1-2  for heart failure, hypertension..  Rate control with diltiazem 30 mg Q 6 hours.    2. Acute Right Heart Failure: Pulmonary hypertension is noted on Echo.  PA pressure elevated at 69 mm Hg. RV size and systolic function is within normal range. Diuresing slowly on IV lasix 80 mg TID. Will decrease lasix to BID in am. Although weights have not been completed this am. She is breathing some better. Continue diureses. Creatinine has remained stable.   3. Valvular Heart disease: Moderate AV stenosis. She will probably benefit from cardiac catheterization once acute illness is resolved. LV is not significantly enlarged, with normal EF.   4. Pulmonary hypertension: Calcium channel blocker will provide benefit to increased lung pressures, she is currently on diltiazem for atrial fib as well.. Consider RHC once influenza illness is resolved. Possibly related to underlying rheumatoid arthritis, Pickwickian syndrome or OSA or in the setting acute bronchial illness. Recommend pulmonary consultation, possible sleep study.  5. Influenza: Airborn  precautions in place.   6 Morbid Obesity 7. Rheumatoid Arthritis  Bettey Mare. Lyman Bishop NP [facility] 08/25/2012, 9:21 AM  Cardiology Attending Patient interviewed and examined. Discussed with Joni Reining, NP.  Above note annotated and modified based upon my findings.  Patient's presentation is suggestive of an acute upper respiratory process with possibilities including influenza, acute bronchitis or pneumonia.  She has not responded completely to appropriate therapy.  Singulair will be added to her regime.  Duration of atrial fibrillation is uncertain, but we have no reason to think it is acute.  Echocardiogram suggests presence of pulmonary hypertension.  Etiologies to consider include obstructive sleep apnea, Pickwickian Syndrome, pulmonary embolism.  D-dimer level and arterial  blood gas to be obtained.  She will likely require chronic anticoagulation.  [place] Bing, MD 08/25/2012, 7:19 PM

## 2012-08-25 NOTE — Progress Notes (Addendum)
     Subjective: This lady feels somewhat better. Her breathing is easier. She is positive for influenza. She is coughing purulent sputum. She says that she has had a good urine output.           Physical Exam: Blood pressure 129/59, pulse 83, temperature 98.1 F (36.7 C), temperature source Oral, resp. rate 20, height 5\' 2"  (1.575 m), weight 120.2 kg (264 lb 15.9 oz), SpO2 99.00%. She looks like she is dyspneic at rest. She has audible wheezing from the bedside. Lung fields clearly show wheezing but  is not very tight. There are no crackles. There is no bronchial breathing. Heart sounds are present and clinically in atrial fibrillation with aortic stenotic murmur. Jugular venous pressure is not particularly raised. She does have peripheral pitting edema which has decreased.. She is alert and orientated.   Investigations:     Basic Metabolic Panel:  Basename 08/25/12 0523 08/24/12 0635  NA 139 135  K 3.9 4.2  CL 95* 96  CO2 38* 32  GLUCOSE 139* 127*  BUN 23 21  CREATININE 0.70 0.70  CALCIUM 9.2 9.4  MG -- --  PHOS -- --       CBC:  Basename 08/25/12 0523 08/24/12 0635  WBC 6.1 7.9  NEUTROABS -- --  HGB 11.9* 12.1  HCT 37.2 37.5  MCV 92.1 92.1  PLT 204 190    Dg Chest 1 View  08/24/2012  *RADIOLOGY REPORT*  Clinical Data: Follow up pulmonary vascular congestion.  CHEST - 1 VIEW  Comparison: Portable chest x-ray 08/24/2012.  Findings: Cardiac silhouette moderately enlarged but stable. Interval improvement in the pulmonary venous hypertension. Developing patchy airspace opacities medially at the right lung base.  Lungs otherwise clear.  IMPRESSION: Resolution of pulmonary venous hypertension.  Developing bronchopneumonia suspected at the right lung base.   Original Report Authenticated By: Hulan Saas, M.D.       Medications: I have reviewed the patient's current medications.  Impression: 1. Influenza A. 2. Acute diastolic congestive heart  failure. 3. Moderate aortic stenosis. 4. Bronchitis. 5. Obesity. 6. Atrial fibrillation.     Plan: 1. Increase Cardizem to 30 mg 4 times a day. 2. Continue with IV Lasix. 3. Continue with oral steroids and antibiotics and Tamiflu. 4. Cardiology consultation today.     LOS: 3 days   Wilson Singer Pager [phone]  08/25/2012, 7:30 AM

## 2012-08-26 ENCOUNTER — Inpatient Hospital Stay (HOSPITAL_COMMUNITY): Payer: Medicare Other

## 2012-08-26 DIAGNOSIS — J189 Pneumonia, unspecified organism: Secondary | ICD-10-CM

## 2012-08-26 LAB — GLUCOSE, CAPILLARY: Glucose-Capillary: 183 mg/dL — ABNORMAL HIGH (ref 70–99)

## 2012-08-26 LAB — BASIC METABOLIC PANEL
Chloride: 92 mEq/L — ABNORMAL LOW (ref 96–112)
GFR calc Af Amer: 81 mL/min — ABNORMAL LOW (ref >90)
Potassium: 3.8 mEq/L (ref 3.5–5.1)
Sodium: 139 mEq/L (ref 135–145)

## 2012-08-26 MED ORDER — RIVAROXABAN 10 MG PO TABS
20.0000 mg | ORAL_TABLET | Freq: Every day | ORAL | Status: DC
  Administered 2012-08-26 – 2012-08-27 (×2): 20 mg via ORAL
  Filled 2012-08-26 (×2): qty 2

## 2012-08-26 MED ORDER — LEVOFLOXACIN 750 MG PO TABS
750.0000 mg | ORAL_TABLET | Freq: Every day | ORAL | Status: DC
  Administered 2012-08-27: 750 mg via ORAL
  Filled 2012-08-26: qty 1

## 2012-08-26 MED ORDER — DILTIAZEM HCL ER COATED BEADS 180 MG PO CP24
180.0000 mg | ORAL_CAPSULE | Freq: Every day | ORAL | Status: DC
  Administered 2012-08-26 – 2012-08-27 (×2): 180 mg via ORAL
  Filled 2012-08-26 (×2): qty 1

## 2012-08-26 MED ORDER — FUROSEMIDE 80 MG PO TABS
80.0000 mg | ORAL_TABLET | Freq: Two times a day (BID) | ORAL | Status: DC
  Administered 2012-08-26 – 2012-08-27 (×2): 80 mg via ORAL
  Filled 2012-08-26 (×2): qty 1

## 2012-08-26 MED ORDER — IOHEXOL 350 MG/ML SOLN
100.0000 mL | Freq: Once | INTRAVENOUS | Status: AC | PRN
  Administered 2012-08-26: 100 mL via INTRAVENOUS

## 2012-08-26 NOTE — Progress Notes (Signed)
Subjective: This lady says she is feeling better. Breathing seems to be easier. No fever.            Physical Exam: Blood pressure 146/77, pulse 74, temperature 97.1 F (36.2 C), temperature source Oral, resp. rate 20, height 5\' 2"  (1.575 m), weight 106.4 kg (234 lb 9.1 oz), SpO2 98.00%. She looks better today than I have seen in the last couple of days. She has no longer audible wheezing from the bedside but does have wheezing on examination with stethoscope.  There are no crackles. There is no bronchial breathing. Heart sounds are present and clinically in atrial fibrillation with aortic stenotic murmur. Jugular venous pressure is not particularly raised. She does have peripheral pitting edema which has decreased.. She is alert and orientated.   Investigations:     Basic Metabolic Panel:  Basename 08/26/12 0554 08/25/12 0523  NA 139 139  K 3.8 3.9  CL 92* 95*  CO2 40* 38*  GLUCOSE 139* 139*  BUN 27* 23  CREATININE 0.83 0.70  CALCIUM 9.4 9.2  MG -- --  PHOS -- --       CBC:  Basename 08/25/12 0523 08/24/12 0635  WBC 6.1 7.9  NEUTROABS -- --  HGB 11.9* 12.1  HCT 37.2 37.5  MCV 92.1 92.1  PLT 204 190    Ct Angio Chest Pe W/cm &/or Wo Cm  08/26/2012  *RADIOLOGY REPORT*  Clinical Data: Shortness of breath for 2 weeks, cough, sinusitis, back pain, evaluate for pulmonary embolism  CT ANGIOGRAPHY CHEST  Technique:  Multidetector CT imaging of the chest using the standard protocol during bolus administration of intravenous contrast. Multiplanar reconstructed images including MIPs were obtained and reviewed to evaluate the vascular anatomy.  Contrast: OMNIPAQUE IOHEXOL 350 MG/ML SOLN  Comparison: Chest radiograph - 08/24/2012; 08/22/2012  Vascular Findings:  There is suboptimal opacification of the pulmonary vascular system with the main pulmonary artery measuring only 214 HU.  Given this limitation, there are no discrete filling defects within the  pulmonary arterial tree to the level of the bilateral subsegmental pulmonary arteries.  Evaluation the distal subsegmental pulmonary arteries is degraded secondary to suboptimal vessel opacification. Normal caliber of the main pulmonary artery.  Cardiomegaly.  No pericardial effusion.  Coronary artery calcifications.  Calcifications within the aortic valve leaflets.  Scattered atherosclerotic calcifications primarily involving the undersurface anterior aspect of the mid/posterior aortic arch. Thoracic aorta is of normal caliber.  Bovine configuration of the aortic arch is incidentally noted.  ------------------------------------------------------------------  Nonvascular findings:  There are rather extensive ill-defined ground-glass opacities scattered throughout the bilateral lungs, worse within the right upper lobe and superior segment of the right lower lobe.  Many of these ground-glass opacities appear to have a slightly tree in bud configuration.  Consolidative opacities within the medial aspect of the bilateral lower lobes, right greater than left, are favored to represent atelectasis. No pleural effusion or pneumothorax.  Subsegmental atelectasis within the superior segment of the lingula.  The central pulmonary airways are patent.  Mediastinal lymphadenopathy with index pretracheal lymph node measuring approximately 1.1 cm in grade short axis diameter (image 26, series 4 and index precarinal lymph node measuring 1.1 4 cm (image 33).  No definite hilar or axillary lymphadenopathy.  Normal appearance of the thyroid.  Incidental imaging of the superior aspect of the abdomen is normal.  No acute or aggressive osseous abnormalities.  Thoracic spine degenerative change.  IMPRESSION:  1.  Negative for pulmonary embolism to the  level of the bilateral subsegmental pulmonary arteries. 2.  Extensive ill-defined ground-glass opacities throughout the bilateral lungs, worse within the right upper lobe and superior segment  the right lower lobe, many of which have a tree in bud configuration.  These findings are worrisome for an infectious etiology (including atypicals), possibly via airway dissemination. Given the upper lobe distribution, aspiration is considered less likely. 3.  Cardiomegaly.  Coronary artery calcifications.  Calcifications within the aortic valve leaflets.   Original Report Authenticated By: Tacey Ruiz, MD       Medications: I have reviewed the patient's current medications.  Impression: 1. Influenza A. 2. Acute diastolic congestive heart failure, clinically improving. 3. Moderate aortic stenosis. 4. Bronchitis. 5. Obesity. 6. Atrial fibrillation. 7. Bilateral pneumonia on CT chest scan. No evidence of pulmonary embolism.     Plan: 1. Switch intravenous Lasix to oral. 2. Start xeralto for anticoagulation. 3. Increased dose of Levaquin to 750 mg daily. 4. Switch Cardizem to 24 hour dose.      LOS: 4 days   Wilson Singer Pager [phone]  08/26/2012, 10:13 AM

## 2012-08-26 NOTE — Progress Notes (Signed)
ANTICOAGULATION CONSULT NOTE   Pharmacy Consult for Lovenox Indication: atrial fibrillation  Allergies  Allergen Reactions  . Penicillins Other (See Comments)    Unknown  . Latex Rash   Patient Measurements: Height: 5\' 2"  (157.5 cm) Weight: 264 lb 15.9 oz (120.2 kg) IBW/kg (Calculated) : 50.1   Vital Signs: Temp: 97.1 F (36.2 C) (12/24 0635) Temp src: Oral (12/24 0635) BP: 146/77 mmHg (12/24 0635) Pulse Rate: 74  (12/24 0635)  Labs:  Basename 08/26/12 0554 08/25/12 0523 08/24/12 0635  HGB -- 11.9* 12.1  HCT -- 37.2 37.5  PLT -- 204 190  APTT -- -- --  LABPROT -- 15.1 --  INR -- 1.21 --  HEPARINUNFRC -- -- --  CREATININE 0.83 0.70 0.70  CKTOTAL -- -- --  CKMB -- -- --  TROPONINI -- -- --    Estimated Creatinine Clearance: 77.8 ml/min (by C-G formula based on Cr of 0.83).   Medical History: Past Medical History  Diagnosis Date  . Arthritis     Medications:  Scheduled:     . antiseptic oral rinse  15 mL Mouth Rinse BID  . dextromethorphan-guaiFENesin  1 tablet Oral BID  . diltiazem  30 mg Oral Q6H  . enoxaparin (LOVENOX) injection  1 mg/kg Subcutaneous Q12H  . fluticasone  2 spray Each Nare Daily  . furosemide  80 mg Intravenous TID PC  . levalbuterol  0.63 mg Nebulization Q4H while awake  . levofloxacin  500 mg Oral Daily  . montelukast  10 mg Oral QHS  . oseltamivir  75 mg Oral BID  . potassium chloride  40 mEq Oral Daily  . predniSONE  40 mg Oral BID  . sodium chloride  3 mL Intravenous Q12H    Assessment: Okay for Protocol.  Awaiting word from cardiology re: plans for long-term anticoagulation with an oral agent.  Continue full dose Lovenox, PLTS OK.  Renal fxn OK.  Estimated Creatinine Clearance: 77.8 ml/min (by C-G formula based on Cr of 0.83).  Goal of Therapy:  Systemic Anticoagulation. Monitor platelets by anticoagulation protocol: Yes   Plan:  Lovenox 1mg /kg SQ every 12 hours. CBC every MWF. PT/INR with AM labs.  Margo Aye, Rodricus Candelaria  A 08/26/2012,8:15 AM

## 2012-08-26 NOTE — Progress Notes (Signed)
UR Chart Review Completed  

## 2012-08-26 NOTE — Progress Notes (Signed)
CRITICAL VALUE ALERT  Critical value received:  0650  Date of notification:  08/26/2012   Time of notification:   Critical value read back: yes  Nurse who received alert:  Delma Freeze   MD notified (1st page):  Dr. Genia Hotter   Time of first page:  0654  MD notified (2nd page):  Time of second page:        MD. Notified via text page at 0654 will report value to oncoming day shift nurse as well.

## 2012-08-26 NOTE — Progress Notes (Signed)
SUBJECTIVE:Feeling better but still coughing.  Principal Problem:  *Dyspnea Active Problems:  Atrial fibrillation  Bronchospasm  Obesity  Acute sinusitis  Aortic stenosis  Pulmonary hypertension   LABS: Basic Metabolic Panel:  Basename 08/26/12 0554 08/25/12 0523  NA 139 139  K 3.8 3.9  CL 92* 95*  CO2 40* 38*  GLUCOSE 139* 139*  BUN 27* 23  CREATININE 0.83 0.70  CALCIUM 9.4 9.2  MG -- --  PHOS -- --   Liver Function Tests:  [facility] 08/24/12 0635  AST 43*  ALT 38*  ALKPHOS 104  BILITOT 0.5  PROT 7.3  ALBUMIN 3.6   No results found for this basename: LIPASE:2,AMYLASE:2 in the last 72 hours CBC:  Basename 08/25/12 0523 08/24/12 0635  WBC 6.1 7.9  NEUTROABS -- --  HGB 11.9* 12.1  HCT 37.2 37.5  MCV 92.1 92.1  PLT 204 190    Basename 08/25/12 1940  DDIMER 0.57*    RADIOLOGY: Ct Angio Chest Pe W/cm &/or Wo Cm  08/26/2012  *RADIOLOGY REPORT*  Clinical Data: Shortness of breath for 2 weeks, cough, sinusitis, back pain, evaluate for pulmonary embolism  CT ANGIOGRAPHY CHEST.  IMPRESSION:  1.  Negative for pulmonary embolism to the level of the bilateral subsegmental pulmonary arteries. 2.  Extensive ill-defined ground-glass opacities throughout the bilateral lungs, worse within the right upper lobe and superior segment the right lower lobe, many of which have a tree in bud configuration.  These findings are worrisome for an infectious etiology (including atypicals), possibly via airway dissemination. Given the upper lobe distribution, aspiration is considered less likely. 3.  Cardiomegaly.  Coronary artery calcifications.  Calcifications within the aortic valve leaflets.   Original Report Authenticated By: Tacey Ruiz, MD      PHYSICAL EXAM BP 146/77  Pulse 74  Temp 97.1 F (36.2 C) (Oral)  Resp 20  Ht 5\' 2"  (1.575 m)  Wt 234 lb 9.1 oz (106.4 kg)  BMI 42.90 kg/m2  SpO2 98%WT Loss: -16 lbs since admission General: Well developed, well nourished, in no  acute distress Head: Eyes PERRLA, No xanthomas.   Normal cephalic and atramatic  Lungs: Inspiratory wheezes with coughing, few rhonchi. Heart: HRIR S1 S2, 2/6 systolic mumur.  Pulses are 2+ & equal.            No carotid bruit. No JVD.  No abdominal bruits. No femoral bruits. Abdomen: Bowel sounds are positive, abdomen soft and non-tender without masses or                  Hernia's noted. Msk:  Back normal, normal gait. Normal strength and tone for age. Extremities: No clubbing, cyanosis, NO edema.  DP +1 Neuro: Alert and oriented X 3. Psych:  Good affect, responds appropriately  TELEMETRY: Reviewed telemetry pt in: Atrial fib rates in 70's.  ASSESSMENT AND PLAN:  1. Atrial Fibrillation: Rate now controlled at present. She is now transitioned to long acting diltiazem and placed on Xarelto. She is tolerating the medications without evidence of hypotension or bleeding. Will need follow up appointment with [facility]. She will be seen on Sep 09, 2012. Discussion will be had for need for future DCCV if she remains in atrial fib.    2. Right heart Failure: She has diuresed 16 lbs since admission if initial weight is accurate. She is now being changed to PO lasix  80 mg BID by Dr. Karilyn Cota. Creatinine 0.83 this am .  3. Pulmonary Hypertension: D-Dimer positive but  CT negative for PE. CT does show ground glass appearance and coronary calcifications.   Consideration for RHC per Dr.Tu Shimmel when seen on follow up. Repeat echo once acute illness is resolved. Continue with CCB.  4. Valvular Heart Disease: Moderate AV stenosis. Consider LHC at the discretion of [name] once acute illness is resolved for better assessment of pressures and status.   5. Influenza; Airborn precautions in place. She is on Tamiflu and being treated for bronchitis.as well with steroids.  Bettey Mare. Lyman Bishop NP [facility] 08/26/2012, 10:13 AM  Cardiology Attending Patient interviewed and examined.  Discussed with Joni Reining, NP.  Above note annotated and modified based upon my findings.  Substantial wheezing persists, but patient still reports that dyspnea has improved substantially.  Weights are variable and not consistent with the patient's I&O, which is mildly positive since admission. Continue current maximal pulmonary Rx.  Pulmonary consultation might be useful.  [place] Bing, MD 08/26/2012, 6:54 PM

## 2012-08-27 LAB — BASIC METABOLIC PANEL
Calcium: 9.7 mg/dL (ref 8.4–10.5)
Creatinine, Ser: 0.83 mg/dL (ref 0.50–1.10)
GFR calc Af Amer: 81 mL/min — ABNORMAL LOW (ref >90)
GFR calc non Af Amer: 70 mL/min — ABNORMAL LOW (ref >90)

## 2012-08-27 LAB — CBC
MCV: 90.8 fL (ref 78.0–100.0)
Platelets: 228 10*3/uL (ref 150–400)
RDW: 13.8 % (ref 11.5–15.5)
WBC: 8.4 10*3/uL (ref 4.0–10.5)

## 2012-08-27 LAB — GLUCOSE, CAPILLARY: Glucose-Capillary: 127 mg/dL — ABNORMAL HIGH (ref 70–99)

## 2012-08-27 MED ORDER — MONTELUKAST SODIUM 10 MG PO TABS: 10.0000 mg | ORAL_TABLET | Freq: Every day | ORAL | Status: DC

## 2012-08-27 MED ORDER — PREDNISONE 20 MG PO TABS: ORAL_TABLET | ORAL | Status: DC

## 2012-08-27 MED ORDER — DILTIAZEM HCL ER COATED BEADS 180 MG PO CP24: 180.0000 mg | ORAL_CAPSULE | Freq: Every day | ORAL | Status: DC

## 2012-08-27 MED ORDER — POTASSIUM CHLORIDE CRYS ER 20 MEQ PO TBCR: 20.0000 meq | EXTENDED_RELEASE_TABLET | Freq: Every day | ORAL | Status: DC

## 2012-08-27 MED ORDER — FUROSEMIDE 40 MG PO TABS: 40.0000 mg | ORAL_TABLET | Freq: Two times a day (BID) | ORAL | Status: DC

## 2012-08-27 MED ORDER — LEVOFLOXACIN 750 MG PO TABS: 750.0000 mg | ORAL_TABLET | Freq: Every day | ORAL | Status: DC

## 2012-08-27 MED ORDER — DM-GUAIFENESIN ER 30-600 MG PO TB12: 1.0000 | ORAL_TABLET | Freq: Two times a day (BID) | ORAL | Status: DC

## 2012-08-27 MED ORDER — RIVAROXABAN 20 MG PO TABS: 20.0000 mg | ORAL_TABLET | Freq: Every day | ORAL | Status: DC

## 2012-08-27 NOTE — Progress Notes (Signed)
Xarelto education completed prior to discharge today.  Pt was given Xarelto handout and questions answered.    Scharlene Gloss, PharmD

## 2012-08-27 NOTE — Discharge Summary (Signed)
Physician Discharge Summary  Sandra Turner UJW:[id number] DOB: 1940/11/04 DOA: 08/22/2012  PCP: [name], MD  Admit date: 08/22/2012 Discharge date: 08/27/2012  Time spent: Greater than 30 minutes  Recommendations for Outpatient Follow-up:  Follow with cardiology, [name] on 09/09/2012.  Discharge Diagnoses:  1. Influenza. 2. Superimposed bilateral pneumonia. 3. Bronchospasm associated with #2. 4. Acute diastolic congestive heart failure. 5. Moderate aortic stenosis. 6. Atrial fibrillation, new. On anticoagulation with xeralto. 7. Obesity.   Discharge Condition: Stable and improved.  Diet recommendation: Low glycemic nutrition.  Filed Weights   08/24/12 0535 08/26/12 0900 08/27/12 0500  Weight: 120.2 kg (264 lb 15.9 oz) 106.4 kg (234 lb 9.1 oz) 110.3 kg (243 lb 2.7 oz)    History of present illness:  This very pleasant 71 year old lady presents to the hospital with symptoms of dyspnea. Please see initial history as outlined below: Sandra Turner is an 71 y.o. female, previously healthy who presents with shortness of breath for about a week. Yesterday, she developed a cough productive of clear sputum. She has had maxillary sinus pressure, postnasal drip. She has had a backache and headache. No fevers or chills. She has no change in chronic peripheral edema. She has no orthopnea, or PND. The dyspnea is mainly exertional. She was given an inhaler last week which has not helped her symptoms. In the emergency room, chest x-ray showed cardiomegaly and possible pulmonary edema. Pro BNP was high. EKG showed atrial fibrillation. She was given steroids, bronchodilators, Lasix and aspirin. She has had similar symptoms with bronchitis in the past. No previous history of congestive heart failure or atrial fibrillation. No chest pains. Feels palpitations only with exertion.  Hospital Course:  The patient was admitted and initially felt to be in congestive heart failure, together with a  combination of bronchitis and possibly pneumonia. Influenza test was positive. She was treated with Tamiflu. She was treated with intravenous steroids and antibiotics for her bronchitis. CT chest scan showed bilateral pneumonia and antibiotics were escalated in her dose. Also, she was felt to be in diastolic congestive heart failure and also had new onset atrial fibrillation. Echocardiogram showed reasonable ejection fraction with moderate aortic stenosis. She was seen by cardiology, who is helped in management of her in terms of her diuretics and antiarrhythmics for ventricular rate control. She was started on xeralto in view of the persistent atrial fibrillation. She also was edematous peripherally and now this is improved. She has lost approximately 20 pounds since admission. She is keen to go home. I think this is reasonable at this point. She already has an appointment with cardiology on 09/09/2012  for followup when  cardioversion could be considered if she remains in atrial fibrillation.  Procedures:  Echocardiogram:  *[facility]* [address] [place], [place] [address] [phone]  ------------------------------------------------------------ Transthoracic Echocardiography  Patient: Sandra Turner MR #: [id number] Study Date: 08/22/2012 Gender: F Age: 48 Height: 157.5cm Weight: 113.4kg BSA: 2.61m^2 Pt. Status: Room: APA10  SONOGRAPHER Karrie Doffing ATTENDING Lendell Caprice, Corinna Raliegh Scarlet, Corinna REFERRING Lendell Caprice, Corinna PERFORMING Delma Freeze Penn cc:  ------------------------------------------------------------ LV EF: 55% - 60%  ------------------------------------------------------------ Indications: CHF - 428.0.  ------------------------------------------------------------ History: PMH: Dyspnea and murmur. PMH: leg swelling  ------------------------------------------------------------ Study Conclusions  - Left ventricle: The cavity size was at  the upper limits of normal. Borderline concentric hypertrophy. Overall systolic function was normal. The estimated ejection fraction was in the range of 55% to 60%. - Ventricular septum: Mild to moderate diastolic septal flattening; less impressive in systolic .  Along with the elevated velocity measured for tricuspid regurgitation, this suggests the presence of significant pulmonary hypertension. - Aortic valve: Mildly to moderately calcified annulus. Moderately thickened, moderately calcified leaflets. Cusp separation was moderately reduced. There was moderate stenosis. Valve area: 1.19cm^2(VTI). Valve area: 0.99cm^2 (Vmax). - Mitral valve: Calcified annulus. - Left atrium: The atrium was moderately dilated. - Right atrium: The atrium was mildly dilated. - Pulmonary arteries: PA peak pressure: 69mm Hg (S). Transthoracic echocardiography. M-mode, complete 2D, spectral Doppler, and color Doppler. Height: Height: 157.5cm. Height: 62in. Weight: Weight: 113.4kg. Weight: 249.5lb. Body mass index: BMI: 45.7kg/m^2. Body surface area: BSA: 2.17m^2. Patient status: Inpatient. Location: Bedside.    Consultations:  Cardiology, [name].  Discharge Exam: Filed Vitals:   08/26/12 2337 08/27/12 0346 08/27/12 0500 08/27/12 0705  BP:   154/65   Pulse:   85   Temp:   97.6 F (36.4 C)   TempSrc:   Oral   Resp:   18   Height:      Weight:   110.3 kg (243 lb 2.7 oz)   SpO2: 95% 95% 97% 95%    General: She looks systemically well. She is not toxic or septic. She is saturating 95% on room air. There is no peripheral or central cyanosis. She is obese. Cardiovascular: Heart sounds are present and remain in atrial fibrillation with an aortic systolic murmur. Respiratory: Lung fields are much clearer with less wheezing now. There are very few crackles. There is very little peripheral pitting edema in her legs now. She is alert and orientated.  Discharge Instructions  Discharge Orders     Future Appointments: Provider: Department: Dept Phone: Center:   09/09/2012 1:00 PM Jodelle Gross, NP [facility] [place] [phone] [id number]     Future Orders Please Complete By Expires   Diet - low sodium heart healthy      Increase activity slowly      Discharge instructions      Comments:   If you have any bleeding from anywhere in your body, please stop xeralto immediately and seek medical advice.       Medication List     As of 08/27/2012  8:32 AM    STOP taking these medications         BC HEADACHE POWDER PO      OVER THE COUNTER MEDICATION      TAKE these medications         dextromethorphan-guaiFENesin 30-600 MG per 12 hr tablet   Commonly known as: MUCINEX DM   Take 1 tablet by mouth 2 (two) times daily.      diltiazem 180 MG 24 hr capsule   Commonly known as: CARDIZEM CD   Take 1 capsule (180 mg total) by mouth daily.      furosemide 40 MG tablet   Commonly known as: LASIX   Take 1 tablet (40 mg total) by mouth 2 (two) times daily.      levofloxacin 750 MG tablet   Commonly known as: LEVAQUIN   Take 1 tablet (750 mg total) by mouth daily.      montelukast 10 MG tablet   Commonly known as: SINGULAIR   Take 1 tablet (10 mg total) by mouth at bedtime.      potassium chloride SA 20 MEQ tablet   Commonly known as: K-DUR,KLOR-CON   Take 1 tablet (20 mEq total) by mouth daily.      predniSONE 20 MG tablet   Commonly known as: DELTASONE  Take 2 tablets daily for 3 days, then 1 tablet daily for 3 days, then half tablet daily for 3 days, then STOP.      Rivaroxaban 20 MG Tabs   Commonly known as: XARELTO   Take 1 tablet (20 mg total) by mouth daily with supper.           Follow-up Information    Follow up with Joni Reining, NP. On 09/09/2012. (1pm Needs EKG)    Contact information:   [address] [place] [address] [phone]           The results of significant diagnostics from this hospitalization (including  imaging, microbiology, ancillary and laboratory) are listed below for reference.    Significant Diagnostic Studies: Dg Chest 1 View  08/24/2012  *RADIOLOGY REPORT*  Clinical Data: Follow up pulmonary vascular congestion.  CHEST - 1 VIEW  Comparison: Portable chest x-ray 08/24/2012.  Findings: Cardiac silhouette moderately enlarged but stable. Interval improvement in the pulmonary venous hypertension. Developing patchy airspace opacities medially at the right lung base.  Lungs otherwise clear.  IMPRESSION: Resolution of pulmonary venous hypertension.  Developing bronchopneumonia suspected at the right lung base.   Original Report Authenticated By: Hulan Saas, M.D.    Ct Angio Chest Pe W/cm &/or Wo Cm  08/26/2012  *RADIOLOGY REPORT*  Clinical Data: Shortness of breath for 2 weeks, cough, sinusitis, back pain, evaluate for pulmonary embolism  CT ANGIOGRAPHY CHEST  Technique:  Multidetector CT imaging of the chest using the standard protocol during bolus administration of intravenous contrast. Multiplanar reconstructed images including MIPs were obtained and reviewed to evaluate the vascular anatomy.  Contrast: OMNIPAQUE IOHEXOL 350 MG/ML SOLN  Comparison: Chest radiograph - 08/24/2012; 08/22/2012  Vascular Findings:  There is suboptimal opacification of the pulmonary vascular system with the main pulmonary artery measuring only 214 HU.  Given this limitation, there are no discrete filling defects within the pulmonary arterial tree to the level of the bilateral subsegmental pulmonary arteries.  Evaluation the distal subsegmental pulmonary arteries is degraded secondary to suboptimal vessel opacification. Normal caliber of the main pulmonary artery.  Cardiomegaly.  No pericardial effusion.  Coronary artery calcifications.  Calcifications within the aortic valve leaflets.  Scattered atherosclerotic calcifications primarily involving the undersurface anterior aspect of the mid/posterior aortic arch.  Thoracic aorta is of normal caliber.  Bovine configuration of the aortic arch is incidentally noted.  ------------------------------------------------------------------  Nonvascular findings:  There are rather extensive ill-defined ground-glass opacities scattered throughout the bilateral lungs, worse within the right upper lobe and superior segment of the right lower lobe.  Many of these ground-glass opacities appear to have a slightly tree in bud configuration.  Consolidative opacities within the medial aspect of the bilateral lower lobes, right greater than left, are favored to represent atelectasis. No pleural effusion or pneumothorax.  Subsegmental atelectasis within the superior segment of the lingula.  The central pulmonary airways are patent.  Mediastinal lymphadenopathy with index pretracheal lymph node measuring approximately 1.1 cm in grade short axis diameter (image 26, series 4 and index precarinal lymph node measuring 1.1 4 cm (image 33).  No definite hilar or axillary lymphadenopathy.  Normal appearance of the thyroid.  Incidental imaging of the superior aspect of the abdomen is normal.  No acute or aggressive osseous abnormalities.  Thoracic spine degenerative change.  IMPRESSION:  1.  Negative for pulmonary embolism to the level of the bilateral subsegmental pulmonary arteries. 2.  Extensive ill-defined ground-glass opacities throughout the bilateral lungs, worse within  the right upper lobe and superior segment the right lower lobe, many of which have a tree in bud configuration.  These findings are worrisome for an infectious etiology (including atypicals), possibly via airway dissemination. Given the upper lobe distribution, aspiration is considered less likely. 3.  Cardiomegaly.  Coronary artery calcifications.  Calcifications within the aortic valve leaflets.   Original Report Authenticated By: Tacey Ruiz, MD    US Carotid Duplex Bilateral  08/26/2012  *RADIOLOGY REPORT*  Clinical Data:  Transmitted murmur versus carotid bruit  BILATERAL CAROTID DUPLEX ULTRASOUND  Technique: Wallace Cullens scale imaging, color Doppler and duplex ultrasound was performed of bilateral carotid and vertebral arteries in the neck.  Comparison:  None.  Criteria:  Quantification of carotid stenosis is based on velocity parameters that correlate the residual internal carotid diameter with NASCET-based stenosis levels, using the diameter of the distal internal carotid lumen as the denominator for stenosis measurement.  The following velocity measurements were obtained:                   PEAK SYSTOLIC/END DIASTOLIC RIGHT ICA:                        67/15cm/sec CCA:                        90/7cm/sec SYSTOLIC ICA/CCA RATIO:     0.75 DIASTOLIC ICA/CCA RATIO:    2.24 ECA:                        104cm/sec  LEFT ICA:                        84/19cm/sec CCA:                        91/11cm/sec SYSTOLIC ICA/CCA RATIO:     0.92 DIASTOLIC ICA/CCA RATIO:    1.7 ECA:                        90cm/sec  Findings:  RIGHT CAROTID ARTERY: There is a very minimal amount of intimal wall thickening within the right carotid bulb (image 17), not resulting elevated peak systolic velocity within the right internal carotid artery to suggest hemodynamically significant stenosis.  RIGHT VERTEBRAL ARTERY:  Antegrade flow  LEFT CAROTID ARTERY: Normal sonographic evaluation of the left carotid system.  No elevated peak systolic velocities within the left internal carotid artery to suggest hemodynamically significant narrowing.  LEFT VERTEBRAL ARTERY:  Antegrade flow  IMPRESSION: 1.  Very minimal amount of right-sided intimal wall thickening within the right carotid bulb, not resulting in hemodynamically significant narrowing. 2.  Normal sonographic evaluation of the left carotid system.   Original Report Authenticated By: Tacey Ruiz, MD    Dg Chest Port 1 View  08/22/2012  *RADIOLOGY REPORT*  Clinical Data: Cough, congestion, wheezing  PORTABLE CHEST - 1 VIEW   Comparison: Portable exam 0752 hours without priors for comparison  Findings: Enlargement of cardiac silhouette with pulmonary vascular congestion. Atherosclerotic calcification of a mildly tortuous thoracic aorta. Hilar enlargement bilaterally question adenopathy. Minimal subsegmental atelectasis at lingula. Lungs otherwise clear. No pleural effusion or pneumothorax. Bones demineralized with bilateral glenohumeral degenerative changes.  IMPRESSION: Enlargement of cardiac silhouette with pulmonary vascular congestion. Hilar enlargement bilaterally question adenopathy; recommend CT chest with contrast to evaluate. Minimal subsegmental atelectasis at  lingula.   Original Report Authenticated By: Ulyses Southward, M.D.         Labs: Basic Metabolic Panel:  Lab 08/27/12 1610 08/26/12 0554 08/25/12 0523 08/24/12 0635 08/23/12 0528  NA 136 139 139 135 136  K 3.9 3.8 3.9 4.2 4.1  CL 91* 92* 95* 96 98  CO2 39* 40* 38* 32 31  GLUCOSE 153* 139* 139* 127* 140*  BUN 29* 27* 23 21 17   CREATININE 0.83 0.83 0.70 0.70 0.85  CALCIUM 9.7 9.4 9.2 9.4 9.3  MG -- -- -- -- --  PHOS -- -- -- -- --   Liver Function Tests:  Lab 08/24/12 0635  AST 43*  ALT 38*  ALKPHOS 104  BILITOT 0.5  PROT 7.3  ALBUMIN 3.6     CBC:  Lab 08/27/12 0622 08/25/12 0523 08/24/12 0635 08/22/12 0720  WBC 8.4 6.1 7.9 8.0  NEUTROABS -- -- -- 6.5  HGB 13.3 11.9* 12.1 11.2*  HCT 40.6 37.2 37.5 34.9*  MCV 90.8 92.1 92.1 92.3  PLT 228 204 190 168   Cardiac Enzymes:  Lab 08/22/12 0720  CKTOTAL --  CKMB --  CKMBINDEX --  TROPONINI <0.30   BNP: BNP (last 3 results)  Basename 08/24/12 0635 08/22/12 0720  PROBNP 1460.0* 1936.0*   CBG:  Lab 08/27/12 0727 08/26/12 1629 08/26/12 0730 08/25/12 1731 08/25/12 0917  GLUCAP 127* 183* 116* 140* 174*       Signed:  Kimmora Risenhoover C  [facility] 08/27/2012, 8:32 AM

## 2012-09-09 ENCOUNTER — Encounter: Payer: Medicare Other | Admitting: Adult Health

## 2012-09-15 ENCOUNTER — Ambulatory Visit (INDEPENDENT_AMBULATORY_CARE_PROVIDER_SITE_OTHER): Payer: Medicare Other | Admitting: Adult Health

## 2012-09-15 ENCOUNTER — Encounter: Payer: Self-pay | Admitting: Adult Health

## 2012-09-15 VITALS — BP 129/82 | HR 76 | Ht 62.0 in | Wt 258.2 lb

## 2012-09-15 DIAGNOSIS — I359 Nonrheumatic aortic valve disorder, unspecified: Secondary | ICD-10-CM

## 2012-09-15 DIAGNOSIS — I350 Nonrheumatic aortic (valve) stenosis: Secondary | ICD-10-CM

## 2012-09-15 DIAGNOSIS — I4891 Unspecified atrial fibrillation: Secondary | ICD-10-CM

## 2012-09-15 DIAGNOSIS — I10 Essential (primary) hypertension: Secondary | ICD-10-CM

## 2012-09-15 MED ORDER — DABIGATRAN ETEXILATE MESYLATE 150 MG PO CAPS: 150.0000 mg | ORAL_CAPSULE | Freq: Two times a day (BID) | ORAL | Status: DC

## 2012-09-15 NOTE — Progress Notes (Deleted)
Name: Sandra Turner    DOB: Mar 07, 1941  Age: 72 y.o.  MR#: [id number]       PCP:  [name], MD      Insurance: @PAYORNAME @   CC:   PT ADVISED THAT SHE NEVER PICKED UP RX FOR XARELTO PER INSURANCE WOULD NOT PAY FOR IT  VS BP 129/82  Pulse 76  Ht 5\' 2"  (1.575 m)  Wt 258 lb 4 oz (117.141 kg)  BMI 47.23 kg/m2  SpO2 98%  Weights Current Weight  09/15/12 258 lb 4 oz (117.141 kg)  08/27/12 243 lb 2.7 oz (110.3 kg)    Blood Pressure  BP Readings from Last 3 Encounters:  09/15/12 129/82  08/27/12 154/65     Admit date:  (Not on file) Last encounter with RMR:  08/25/2012   Allergy Allergies  Allergen Reactions  . Penicillins Other (See Comments)    Unknown  . Latex Rash    Current Outpatient Prescriptions  Medication Sig Dispense Refill  . diltiazem (CARDIZEM CD) 180 MG 24 hr capsule Take 1 capsule (180 mg total) by mouth daily.  30 capsule  0  . furosemide (LASIX) 40 MG tablet Take 40 mg by mouth daily.      . montelukast (SINGULAIR) 10 MG tablet Take 1 tablet (10 mg total) by mouth at bedtime.  30 tablet  0  . potassium chloride SA (K-DUR,KLOR-CON) 20 MEQ tablet Take 1 tablet (20 mEq total) by mouth daily.  30 tablet  0    Discontinued Meds:    Medications Discontinued During This Encounter  Medication Reason  . dextromethorphan-guaiFENesin (MUCINEX DM) 30-600 MG per 12 hr tablet Error  . rivaroxaban (XARELTO) 20 MG TABS Error  . predniSONE (DELTASONE) 20 MG tablet Error  . levofloxacin (LEVAQUIN) 750 MG tablet Error  . furosemide (LASIX) 40 MG tablet     Patient Active Problem List  Diagnosis  . Atrial fibrillation  . Dyspnea  . Bronchospasm  . Obesity  . Acute sinusitis  . Aortic stenosis  . Pulmonary hypertension    LABS Admission on 08/22/2012, Discharged on 08/27/2012  Component Date Value  . Sodium 08/22/2012 138   . Potassium 08/22/2012 3.6   . Chloride 08/22/2012 100   . CO2 08/22/2012 28   . Glucose, Bld 08/22/2012 149*  . BUN 08/22/2012 10     . Creatinine, Ser 08/22/2012 0.65   . Calcium 08/22/2012 9.3   . GFR calc non Af Amer 08/22/2012 88*  . GFR calc Af Amer 08/22/2012 >90   . WBC 08/22/2012 8.0   . RBC 08/22/2012 3.78*  . Hemoglobin 08/22/2012 11.2*  . HCT 08/22/2012 34.9*  . MCV 08/22/2012 92.3   . [facility] 08/22/2012 29.6   . MCHC 08/22/2012 32.1   . RDW 08/22/2012 14.7   . Platelets 08/22/2012 168   . Neutrophils Relative 08/22/2012 81*  . Neutro Abs 08/22/2012 6.5   . Lymphocytes Relative 08/22/2012 9*  . Lymphs Abs 08/22/2012 0.7   . Monocytes Relative 08/22/2012 9   . Monocytes Absolute 08/22/2012 0.7   . Eosinophils Relative 08/22/2012 0   . Eosinophils Absolute 08/22/2012 0.0   . Basophils Relative 08/22/2012 0   . Basophils Absolute 08/22/2012 0.0   . Troponin I 08/22/2012 <0.30   . Pro B Natriuretic peptid* 08/22/2012 1936.0*  . TSH 08/22/2012 0.916   . Influenza A By PCR 08/22/2012 POSITIVE*  . Influenza B By PCR 08/22/2012 NEGATIVE   . H1N1 flu by pcr 08/22/2012 DETECTED*  . Sodium  08/23/2012 136   . Potassium 08/23/2012 4.1   . Chloride 08/23/2012 98   . CO2 08/23/2012 31   . Glucose, Bld 08/23/2012 140*  . BUN 08/23/2012 17   . Creatinine, Ser 08/23/2012 0.85   . Calcium 08/23/2012 9.3   . GFR calc non Af Amer 08/23/2012 68*  . GFR calc Af Amer 08/23/2012 79*  . Glucose-Capillary 08/23/2012 129*  . Comment 1 08/23/2012 Documented in Chart   . Comment 2 08/23/2012 Notify RN   . Glucose-Capillary 08/23/2012 110*  . Comment 1 08/23/2012 Documented in Chart   . Comment 2 08/23/2012 Notify RN   . WBC 08/24/2012 7.9   . RBC 08/24/2012 4.07   . Hemoglobin 08/24/2012 12.1   . HCT 08/24/2012 37.5   . MCV 08/24/2012 92.1   . [facility] 08/24/2012 29.7   . MCHC 08/24/2012 32.3   . RDW 08/24/2012 14.7   . Platelets 08/24/2012 190   . Sodium 08/24/2012 135   . Potassium 08/24/2012 4.2   . Chloride 08/24/2012 96   . CO2 08/24/2012 32   . Glucose, Bld 08/24/2012 127*  . BUN 08/24/2012 21   .  Creatinine, Ser 08/24/2012 0.70   . Calcium 08/24/2012 9.4   . Total Protein 08/24/2012 7.3   . Albumin 08/24/2012 3.6   . AST 08/24/2012 43*  . ALT 08/24/2012 38*  . Alkaline Phosphatase 08/24/2012 104   . Total Bilirubin 08/24/2012 0.5   . GFR calc non Af Amer 08/24/2012 86*  . GFR calc Af Amer 08/24/2012 >90   . Pro B Natriuretic peptid* 08/24/2012 1460.0*  . Glucose-Capillary 08/24/2012 126*  . Comment 1 08/24/2012 Documented in Chart   . Comment 2 08/24/2012 Notify RN   . Glucose-Capillary 08/24/2012 102*  . Comment 1 08/24/2012 Documented in Chart   . Comment 2 08/24/2012 Notify RN   . Sodium 08/25/2012 139   . Potassium 08/25/2012 3.9   . Chloride 08/25/2012 95*  . CO2 08/25/2012 38*  . Glucose, Bld 08/25/2012 139*  . BUN 08/25/2012 23   . Creatinine, Ser 08/25/2012 0.70   . Calcium 08/25/2012 9.2   . GFR calc non Af Amer 08/25/2012 86*  . GFR calc Af Amer 08/25/2012 >90   . Prothrombin Time 08/25/2012 15.1   . INR 08/25/2012 1.21   . WBC 08/25/2012 6.1   . RBC 08/25/2012 4.04   . Hemoglobin 08/25/2012 11.9*  . HCT 08/25/2012 37.2   . MCV 08/25/2012 92.1   . [facility] 08/25/2012 29.5   . MCHC 08/25/2012 32.0   . RDW 08/25/2012 14.5   . Platelets 08/25/2012 204   . Glucose-Capillary 08/25/2012 174*  . Sodium 08/26/2012 139   . Potassium 08/26/2012 3.8   . Chloride 08/26/2012 92*  . CO2 08/26/2012 40*  . Glucose, Bld 08/26/2012 139*  . BUN 08/26/2012 27*  . Creatinine, Ser 08/26/2012 0.83   . Calcium 08/26/2012 9.4   . GFR calc non Af Amer 08/26/2012 70*  . GFR calc Af Amer 08/26/2012 81*  . Glucose-Capillary 08/25/2012 140*  . D-Dimer, Quant 08/25/2012 0.57*  . O2 Content 08/25/2012 3.0   . Delivery systems 08/25/2012 NASAL CANNULA   . pH, Arterial 08/25/2012 7.492*  . pCO2 arterial 08/25/2012 50.3*  . pO2, Arterial 08/25/2012 77.6*  . Bicarbonate 08/25/2012 38.1*  . TCO2 08/25/2012 32.8   . Acid-Base Excess 08/25/2012 13.7*  . O2 Saturation 08/25/2012 96.4    . Patient temperature 08/25/2012 37.0   . Collection site 08/25/2012 RIGHT RADIAL   .  Drawn by 08/25/2012 [id number]   . Sample type 08/25/2012 ARTERIAL   . Allens test (pass/fail) 08/25/2012 PASS   . Glucose-Capillary 08/26/2012 116*  . Comment 1 08/26/2012 Documented in Chart   . Comment 2 08/26/2012 Notify RN   . Glucose-Capillary 08/26/2012 183*  . Comment 1 08/26/2012 Documented in Chart   . Comment 2 08/26/2012 Notify RN   . Sodium 08/27/2012 136   . Potassium 08/27/2012 3.9   . Chloride 08/27/2012 91*  . CO2 08/27/2012 39*  . Glucose, Bld 08/27/2012 153*  . BUN 08/27/2012 29*  . Creatinine, Ser 08/27/2012 0.83   . Calcium 08/27/2012 9.7   . GFR calc non Af Amer 08/27/2012 70*  . GFR calc Af Amer 08/27/2012 81*  . WBC 08/27/2012 8.4   . RBC 08/27/2012 4.47   . Hemoglobin 08/27/2012 13.3   . HCT 08/27/2012 40.6   . MCV 08/27/2012 90.8   . [facility] 08/27/2012 29.8   . MCHC 08/27/2012 32.8   . RDW 08/27/2012 13.8   . Platelets 08/27/2012 228   . Glucose-Capillary 08/27/2012 127*     Results for this Opt Visit:     Results for orders placed during the hospital encounter of 08/22/12  BASIC METABOLIC PANEL      Component Value Range   Sodium 138  135 - 145 mEq/L   Potassium 3.6  3.5 - 5.1 mEq/L   Chloride 100  96 - 112 mEq/L   CO2 28  19 - 32 mEq/L   Glucose, Bld 149 (*) 70 - 99 mg/dL   BUN 10  6 - 23 mg/dL   Creatinine, Ser 0.45  0.50 - 1.10 mg/dL   Calcium 9.3  8.4 - 40.9 mg/dL   GFR calc non Af Amer 88 (*) >90 mL/min   GFR calc Af Amer >90  >90 mL/min  CBC WITH DIFFERENTIAL      Component Value Range   WBC 8.0  4.0 - 10.5 K/uL   RBC 3.78 (*) 3.87 - 5.11 MIL/uL   Hemoglobin 11.2 (*) 12.0 - 15.0 g/dL   HCT 81.1 (*) 91.4 - 78.2 %   MCV 92.3  78.0 - 100.0 fL   MCH 29.6  26.0 - 34.0 pg   MCHC 32.1  30.0 - 36.0 g/dL   RDW 95.6  21.3 - 08.6 %   Platelets 168  150 - 400 K/uL   Neutrophils Relative 81 (*) 43 - 77 %   Neutro Abs 6.5  1.7 - 7.7 K/uL   Lymphocytes Relative  9 (*) 12 - 46 %   Lymphs Abs 0.7  0.7 - 4.0 K/uL   Monocytes Relative 9  3 - 12 %   Monocytes Absolute 0.7  0.1 - 1.0 K/uL   Eosinophils Relative 0  0 - 5 %   Eosinophils Absolute 0.0  0.0 - 0.7 K/uL   Basophils Relative 0  0 - 1 %   Basophils Absolute 0.0  0.0 - 0.1 K/uL  TROPONIN I      Component Value Range   Troponin I <0.30  <0.30 ng/mL  PRO B NATRIURETIC PEPTIDE      Component Value Range   Pro B Natriuretic peptide (BNP) 1936.0 (*) 0 - 125 pg/mL  TSH      Component Value Range   TSH 0.916  0.350 - 4.500 uIU/mL  INFLUENZA PANEL BY PCR      Component Value Range   Influenza A By PCR POSITIVE (*) NEGATIVE   Influenza B  By PCR NEGATIVE  NEGATIVE   H1N1 flu by pcr DETECTED (*) NOT DETECTED  BASIC METABOLIC PANEL      Component Value Range   Sodium 136  135 - 145 mEq/L   Potassium 4.1  3.5 - 5.1 mEq/L   Chloride 98  96 - 112 mEq/L   CO2 31  19 - 32 mEq/L   Glucose, Bld 140 (*) 70 - 99 mg/dL   BUN 17  6 - 23 mg/dL   Creatinine, Ser 5.62  0.50 - 1.10 mg/dL   Calcium 9.3  8.4 - 13.0 mg/dL   GFR calc non Af Amer 68 (*) >90 mL/min   GFR calc Af Amer 79 (*) >90 mL/min  GLUCOSE, CAPILLARY      Component Value Range   Glucose-Capillary 129 (*) 70 - 99 mg/dL   Comment 1 Documented in Chart     Comment 2 Notify RN    GLUCOSE, CAPILLARY      Component Value Range   Glucose-Capillary 110 (*) 70 - 99 mg/dL   Comment 1 Documented in Chart     Comment 2 Notify RN    CBC      Component Value Range   WBC 7.9  4.0 - 10.5 K/uL   RBC 4.07  3.87 - 5.11 MIL/uL   Hemoglobin 12.1  12.0 - 15.0 g/dL   HCT 86.5  78.4 - 69.6 %   MCV 92.1  78.0 - 100.0 fL   MCH 29.7  26.0 - 34.0 pg   MCHC 32.3  30.0 - 36.0 g/dL   RDW 29.5  28.4 - 13.2 %   Platelets 190  150 - 400 K/uL  COMPREHENSIVE METABOLIC PANEL      Component Value Range   Sodium 135  135 - 145 mEq/L   Potassium 4.2  3.5 - 5.1 mEq/L   Chloride 96  96 - 112 mEq/L   CO2 32  19 - 32 mEq/L   Glucose, Bld 127 (*) 70 - 99 mg/dL   BUN  21  6 - 23 mg/dL   Creatinine, Ser 4.40  0.50 - 1.10 mg/dL   Calcium 9.4  8.4 - 10.2 mg/dL   Total Protein 7.3  6.0 - 8.3 g/dL   Albumin 3.6  3.5 - 5.2 g/dL   AST 43 (*) 0 - 37 U/L   ALT 38 (*) 0 - 35 U/L   Alkaline Phosphatase 104  39 - 117 U/L   Total Bilirubin 0.5  0.3 - 1.2 mg/dL   GFR calc non Af Amer 86 (*) >90 mL/min   GFR calc Af Amer >90  >90 mL/min  PRO B NATRIURETIC PEPTIDE      Component Value Range   Pro B Natriuretic peptide (BNP) 1460.0 (*) 0 - 125 pg/mL  GLUCOSE, CAPILLARY      Component Value Range   Glucose-Capillary 126 (*) 70 - 99 mg/dL   Comment 1 Documented in Chart     Comment 2 Notify RN    GLUCOSE, CAPILLARY      Component Value Range   Glucose-Capillary 102 (*) 70 - 99 mg/dL   Comment 1 Documented in Chart     Comment 2 Notify RN    BASIC METABOLIC PANEL      Component Value Range   Sodium 139  135 - 145 mEq/L   Potassium 3.9  3.5 - 5.1 mEq/L   Chloride 95 (*) 96 - 112 mEq/L   CO2 38 (*) 19 - 32 mEq/L  Glucose, Bld 139 (*) 70 - 99 mg/dL   BUN 23  6 - 23 mg/dL   Creatinine, Ser 1.61  0.50 - 1.10 mg/dL   Calcium 9.2  8.4 - 09.6 mg/dL   GFR calc non Af Amer 86 (*) >90 mL/min   GFR calc Af Amer >90  >90 mL/min  PROTIME-INR      Component Value Range   Prothrombin Time 15.1  11.6 - 15.2 seconds   INR 1.21  0.00 - 1.49  CBC      Component Value Range   WBC 6.1  4.0 - 10.5 K/uL   RBC 4.04  3.87 - 5.11 MIL/uL   Hemoglobin 11.9 (*) 12.0 - 15.0 g/dL   HCT 04.5  40.9 - 81.1 %   MCV 92.1  78.0 - 100.0 fL   MCH 29.5  26.0 - 34.0 pg   MCHC 32.0  30.0 - 36.0 g/dL   RDW 91.4  78.2 - 95.6 %   Platelets 204  150 - 400 K/uL  GLUCOSE, CAPILLARY      Component Value Range   Glucose-Capillary 174 (*) 70 - 99 mg/dL  BASIC METABOLIC PANEL      Component Value Range   Sodium 139  135 - 145 mEq/L   Potassium 3.8  3.5 - 5.1 mEq/L   Chloride 92 (*) 96 - 112 mEq/L   CO2 40 (*) 19 - 32 mEq/L   Glucose, Bld 139 (*) 70 - 99 mg/dL   BUN 27 (*) 6 - 23 mg/dL    Creatinine, Ser 2.13  0.50 - 1.10 mg/dL   Calcium 9.4  8.4 - 08.6 mg/dL   GFR calc non Af Amer 70 (*) >90 mL/min   GFR calc Af Amer 81 (*) >90 mL/min  GLUCOSE, CAPILLARY      Component Value Range   Glucose-Capillary 140 (*) 70 - 99 mg/dL  D-DIMER, QUANTITATIVE      Component Value Range   D-Dimer, Quant 0.57 (*) 0.00 - 0.48 ug/mL-FEU  BLOOD GAS, ARTERIAL      Component Value Range   O2 Content 3.0     Delivery systems NASAL CANNULA     pH, Arterial 7.492 (*) 7.350 - 7.450   pCO2 arterial 50.3 (*) 35.0 - 45.0 mmHg   pO2, Arterial 77.6 (*) 80.0 - 100.0 mmHg   Bicarbonate 38.1 (*) 20.0 - 24.0 mEq/L   TCO2 32.8  0 - 100 mmol/L   Acid-Base Excess 13.7 (*) 0.0 - 2.0 mmol/L   O2 Saturation 96.4     Patient temperature 37.0     Collection site RIGHT RADIAL     Drawn by [id number]     Sample type ARTERIAL     Allens test (pass/fail) PASS  PASS  GLUCOSE, CAPILLARY      Component Value Range   Glucose-Capillary 116 (*) 70 - 99 mg/dL   Comment 1 Documented in Chart     Comment 2 Notify RN    GLUCOSE, CAPILLARY      Component Value Range   Glucose-Capillary 183 (*) 70 - 99 mg/dL   Comment 1 Documented in Chart     Comment 2 Notify RN    BASIC METABOLIC PANEL      Component Value Range   Sodium 136  135 - 145 mEq/L   Potassium 3.9  3.5 - 5.1 mEq/L   Chloride 91 (*) 96 - 112 mEq/L   CO2 39 (*) 19 - 32 mEq/L   Glucose, Bld 153 (*)  70 - 99 mg/dL   BUN 29 (*) 6 - 23 mg/dL   Creatinine, Ser 1.61  0.50 - 1.10 mg/dL   Calcium 9.7  8.4 - 09.6 mg/dL   GFR calc non Af Amer 70 (*) >90 mL/min   GFR calc Af Amer 81 (*) >90 mL/min  CBC      Component Value Range   WBC 8.4  4.0 - 10.5 K/uL   RBC 4.47  3.87 - 5.11 MIL/uL   Hemoglobin 13.3  12.0 - 15.0 g/dL   HCT 04.5  40.9 - 81.1 %   MCV 90.8  78.0 - 100.0 fL   MCH 29.8  26.0 - 34.0 pg   MCHC 32.8  30.0 - 36.0 g/dL   RDW 91.4  78.2 - 95.6 %   Platelets 228  150 - 400 K/uL  GLUCOSE, CAPILLARY      Component Value Range   Glucose-Capillary  127 (*) 70 - 99 mg/dL    EKG Orders placed in visit on 09/15/12  . EKG 12-LEAD     Prior Assessment and Plan Problem List as of 09/15/2012          Atrial fibrillation   Dyspnea   Bronchospasm   Obesity   Acute sinusitis   Aortic stenosis   Pulmonary hypertension       Imaging: Dg Chest 1 View  08/24/2012  *RADIOLOGY REPORT*  Clinical Data: Follow up pulmonary vascular congestion.  CHEST - 1 VIEW  Comparison: Portable chest x-ray 08/24/2012.  Findings: Cardiac silhouette moderately enlarged but stable. Interval improvement in the pulmonary venous hypertension. Developing patchy airspace opacities medially at the right lung base.  Lungs otherwise clear.  IMPRESSION: Resolution of pulmonary venous hypertension.  Developing bronchopneumonia suspected at the right lung base.   Original Report Authenticated By: Hulan Saas, M.D.    Ct Angio Chest Pe W/cm &/or Wo Cm  08/26/2012  *RADIOLOGY REPORT*  Clinical Data: Shortness of breath for 2 weeks, cough, sinusitis, back pain, evaluate for pulmonary embolism  CT ANGIOGRAPHY CHEST  Technique:  Multidetector CT imaging of the chest using the standard protocol during bolus administration of intravenous contrast. Multiplanar reconstructed images including MIPs were obtained and reviewed to evaluate the vascular anatomy.  Contrast: OMNIPAQUE IOHEXOL 350 MG/ML SOLN  Comparison: Chest radiograph - 08/24/2012; 08/22/2012  Vascular Findings:  There is suboptimal opacification of the pulmonary vascular system with the main pulmonary artery measuring only 214 HU.  Given this limitation, there are no discrete filling defects within the pulmonary arterial tree to the level of the bilateral subsegmental pulmonary arteries.  Evaluation the distal subsegmental pulmonary arteries is degraded secondary to suboptimal vessel opacification. Normal caliber of the main pulmonary artery.  Cardiomegaly.  No pericardial effusion.  Coronary artery calcifications.   Calcifications within the aortic valve leaflets.  Scattered atherosclerotic calcifications primarily involving the undersurface anterior aspect of the mid/posterior aortic arch. Thoracic aorta is of normal caliber.  Bovine configuration of the aortic arch is incidentally noted.  ------------------------------------------------------------------  Nonvascular findings:  There are rather extensive ill-defined ground-glass opacities scattered throughout the bilateral lungs, worse within the right upper lobe and superior segment of the right lower lobe.  Many of these ground-glass opacities appear to have a slightly tree in bud configuration.  Consolidative opacities within the medial aspect of the bilateral lower lobes, right greater than left, are favored to represent atelectasis. No pleural effusion or pneumothorax.  Subsegmental atelectasis within the superior segment of the lingula.  The central  pulmonary airways are patent.  Mediastinal lymphadenopathy with index pretracheal lymph node measuring approximately 1.1 cm in grade short axis diameter (image 26, series 4 and index precarinal lymph node measuring 1.1 4 cm (image 33).  No definite hilar or axillary lymphadenopathy.  Normal appearance of the thyroid.  Incidental imaging of the superior aspect of the abdomen is normal.  No acute or aggressive osseous abnormalities.  Thoracic spine degenerative change.  IMPRESSION:  1.  Negative for pulmonary embolism to the level of the bilateral subsegmental pulmonary arteries. 2.  Extensive ill-defined ground-glass opacities throughout the bilateral lungs, worse within the right upper lobe and superior segment the right lower lobe, many of which have a tree in bud configuration.  These findings are worrisome for an infectious etiology (including atypicals), possibly via airway dissemination. Given the upper lobe distribution, aspiration is considered less likely. 3.  Cardiomegaly.  Coronary artery calcifications.   Calcifications within the aortic valve leaflets.   Original Report Authenticated By: Tacey Ruiz, MD    US Carotid Duplex Bilateral  08/26/2012  *RADIOLOGY REPORT*  Clinical Data: Transmitted murmur versus carotid bruit  BILATERAL CAROTID DUPLEX ULTRASOUND  Technique: Wallace Cullens scale imaging, color Doppler and duplex ultrasound was performed of bilateral carotid and vertebral arteries in the neck.  Comparison:  None.  Criteria:  Quantification of carotid stenosis is based on velocity parameters that correlate the residual internal carotid diameter with NASCET-based stenosis levels, using the diameter of the distal internal carotid lumen as the denominator for stenosis measurement.  The following velocity measurements were obtained:                   PEAK SYSTOLIC/END DIASTOLIC RIGHT ICA:                        67/15cm/sec CCA:                        90/7cm/sec SYSTOLIC ICA/CCA RATIO:     0.75 DIASTOLIC ICA/CCA RATIO:    2.24 ECA:                        104cm/sec  LEFT ICA:                        84/19cm/sec CCA:                        91/11cm/sec SYSTOLIC ICA/CCA RATIO:     0.92 DIASTOLIC ICA/CCA RATIO:    1.7 ECA:                        90cm/sec  Findings:  RIGHT CAROTID ARTERY: There is a very minimal amount of intimal wall thickening within the right carotid bulb (image 17), not resulting elevated peak systolic velocity within the right internal carotid artery to suggest hemodynamically significant stenosis.  RIGHT VERTEBRAL ARTERY:  Antegrade flow  LEFT CAROTID ARTERY: Normal sonographic evaluation of the left carotid system.  No elevated peak systolic velocities within the left internal carotid artery to suggest hemodynamically significant narrowing.  LEFT VERTEBRAL ARTERY:  Antegrade flow  IMPRESSION: 1.  Very minimal amount of right-sided intimal wall thickening within the right carotid bulb, not resulting in hemodynamically significant narrowing. 2.  Normal sonographic evaluation of the left carotid system.    Original Report Authenticated By: Tacey Ruiz, MD  Dg Chest Port 1 View  08/22/2012  *RADIOLOGY REPORT*  Clinical Data: Cough, congestion, wheezing  PORTABLE CHEST - 1 VIEW  Comparison: Portable exam 0752 hours without priors for comparison  Findings: Enlargement of cardiac silhouette with pulmonary vascular congestion. Atherosclerotic calcification of a mildly tortuous thoracic aorta. Hilar enlargement bilaterally question adenopathy. Minimal subsegmental atelectasis at lingula. Lungs otherwise clear. No pleural effusion or pneumothorax. Bones demineralized with bilateral glenohumeral degenerative changes.  IMPRESSION: Enlargement of cardiac silhouette with pulmonary vascular congestion. Hilar enlargement bilaterally question adenopathy; recommend CT chest with contrast to evaluate. Minimal subsegmental atelectasis at lingula.   Original Report Authenticated By: Ulyses Southward, M.D.      [facility] Calculation: Score not calculated. Missing: Total Cholesterol, HDL

## 2012-09-15 NOTE — Assessment & Plan Note (Signed)
Heart rater is well controlled currently on dose of cardizem 180 mg daily She is refusing coumadin which is covered by her Medicare complete and will not take Xarelto as it is not covered. Will try her on Pradaxa 150 mg BID in the hopes that this will be covered on her insurance. We had planned to discuss DCCV afte 6 weeks of anticoagulation therapy, but she has not been on the medication for 3 weeks. I have discussed with her the high likelihood of CVA without anticoagulation. She verbalizes understanding is willing to take the risks if her insurance will not cover the novel anticoagulant medications. We have given her samples of Pradaxa 75 mg which she will take 2 of BID. We will see her again in one month.

## 2012-09-15 NOTE — Patient Instructions (Addendum)
Your physician recommends that you schedule a follow-up appointment in: ONE MONTH WITH KL  Your physician has recommended you make the following change in your medication:   1)START PRADAXA 150MG  TWICE DAILY

## 2012-09-15 NOTE — Assessment & Plan Note (Signed)
Continue current regimen with heart rate control. She has gained 10 lbs which I believe is due to dietary noncompliance, lasix remains at 40 mg BID. Will continue to follow. No evidence of CHF despite mild non-pitting LEE.

## 2012-09-15 NOTE — Progress Notes (Signed)
   HPI: Mrs. Selkirk is a 72 y/o patient of [name] we are seeing on follow up s/p hospital consultation for atrial fib with RVR, AoV stenosis and CHF in the setting of pneumonia and bronchitis. During hospitalization she was diuresed 20 lbs, placed on cardizem po for heart rate control and xarleto. She has since stopped taking xarelto for the last 3 weeks, as her insurance did not cover this medication. She refuses coumdin "because of the side effects". She has gained 10 lbs since being discharged with dietary noncompliance. She denies chest pain or DOE. EF on hospitalization 55%-60%.  She is on lasix 40 mg BID. She denies medical noncompliance  With the exception of the lasix.  Allergies  Allergen Reactions  . Penicillins Other (See Comments)    Unknown  . Latex Rash    Current Outpatient Prescriptions  Medication Sig Dispense Refill  . diltiazem (CARDIZEM CD) 180 MG 24 hr capsule Take 1 capsule (180 mg total) by mouth daily.  30 capsule  0  . furosemide (LASIX) 40 MG tablet Take 40 mg by mouth daily.      . montelukast (SINGULAIR) 10 MG tablet Take 1 tablet (10 mg total) by mouth at bedtime.  30 tablet  0  . potassium chloride SA (K-DUR,KLOR-CON) 20 MEQ tablet Take 1 tablet (20 mEq total) by mouth daily.  30 tablet  0  . dabigatran (PRADAXA) 150 MG CAPS Take 1 capsule (150 mg total) by mouth every 12 (twelve) hours.  60 capsule  3    Past Medical History  Diagnosis Date  . Arthritis     Past Surgical History  Procedure Date  . None     YQM:VHQION of systems complete and found to be negative unless listed above  PHYSICAL EXAM BP 129/82  Pulse 76  Ht 5\' 2"  (1.575 m)  Wt 258 lb 4 oz (117.141 kg)  BMI 47.23 kg/m2  SpO2 98%  General: Well developed, well nourished, in no acute distress Head: Eyes PERRLA, No xanthomas.   Normal cephalic and atramatic  Lungs: Clear bilaterally to auscultation and percussion. Heart: HRRR S1 S2, 2/6 systolic murmur,  without MRG.  Pulses are  2+ & equal.            No carotid bruit. No JVD.  No abdominal bruits. No femoral bruits. Abdomen: Bowel sounds are positive, abdomen soft and non-tender without masses or                  Hernia's noted. Msk:  Back normal, normal gait. Normal strength and tone for age. Extremities: No clubbing, cyanosis, with non-pitting bilateral pretibial edema.  DP +1 Neuro: Alert and oriented X 3. Psych:  Good affect, responds appropriately  GEX:BMWUXL fibrillation rate of 81 bpm with Left axis deviation.   ASSESSMENT AND PLAN

## 2012-10-20 ENCOUNTER — Encounter: Payer: Self-pay | Admitting: Adult Health

## 2012-10-20 ENCOUNTER — Ambulatory Visit (INDEPENDENT_AMBULATORY_CARE_PROVIDER_SITE_OTHER): Payer: Medicare Other | Admitting: Adult Health

## 2012-10-20 VITALS — BP 120/64 | HR 84 | Ht 62.0 in | Wt 254.0 lb

## 2012-10-20 DIAGNOSIS — I4891 Unspecified atrial fibrillation: Secondary | ICD-10-CM

## 2012-10-20 DIAGNOSIS — I350 Nonrheumatic aortic (valve) stenosis: Secondary | ICD-10-CM

## 2012-10-20 DIAGNOSIS — I359 Nonrheumatic aortic valve disorder, unspecified: Secondary | ICD-10-CM

## 2012-10-20 NOTE — Assessment & Plan Note (Signed)
She continues asymptomatic for chest pain or DOE with AoV stenosis, described as moderate per echo in 09/2010. Will continue current medical management with repeat echo in one year unless symptomatic.

## 2012-10-20 NOTE — Assessment & Plan Note (Signed)
Heart rate is well controlled on diltiazem.  She continues on pradaxa without evidence of bleeding or melena.  She will continue on current regimen with follow up with Dr.McDowell to discuss DCCV if she remains in atrial fib.

## 2012-10-20 NOTE — Progress Notes (Signed)
   HPI: Mrs. Cheryll Dessert is a pleasant 72 y/o patient of Dr.Rothbart we are seeing for ongoing assessment and treatment of chronic atrial fibrillation,, AoV stenosis, hypertension. She has continued to refuse coumadin, is unable to afford xarelto as insurance does not pay for this, but has tolerated use of Pradaxa BID, and is able to afford medication. She comes today without complaints. She is not gaining wt or having evidence of DOE. She has chronic arthritis, and wears copper necklaces and bracelets for "pain control." She works as a Producer, television/film/video, and is on her feet for many hours at a time. Denies pain or intermittent claudication symptoms. Allergies  Allergen Reactions  . Penicillins Other (See Comments)    Unknown  . Latex Rash    Current Outpatient Prescriptions  Medication Sig Dispense Refill  . dabigatran (PRADAXA) 150 MG CAPS Take 1 capsule (150 mg total) by mouth every 12 (twelve) hours.  60 capsule  3   No current facility-administered medications for this visit.    Past Medical History  Diagnosis Date  . Arthritis     Past Surgical History  Procedure Laterality Date  . None      XBJ:YNWGNF of systems complete and found to be negative unless listed above  PHYSICAL EXAM BP 120/64  Pulse 84  Ht 5\' 2"  (1.575 m)  Wt 254 lb (115.214 kg)  BMI 46.45 kg/m2  General: Well developed, well nourished, in no acute distress Head: Eyes PERRLA, No xanthomas.   Normal cephalic and atramatic  Lungs: Clear bilaterally to auscultation and percussion. Heart: HRIR S1 S2, with 2/6 systolic murmur.  Pulses are 2+ & equal.            No carotid bruit. No JVD.  No abdominal bruits. No femoral bruits. Abdomen: Bowel sounds are positive, abdomen soft and non-tender without masses or                  Hernia's noted. Msk:  Back normal, normal gait. Normal strength and tone for age. Extremities: No clubbing, cyanosis or edema.  DP +1 Neuro: Alert and oriented X 3. Psych:  Good affect, responds  appropriately    ASSESSMENT AND PLAN

## 2012-10-20 NOTE — Patient Instructions (Addendum)
Your physician recommends that you schedule a follow-up appointment in: 6 MONTHS   Your physician recommends that you continue on your current medications as directed. Please refer to the Current Medication list given to you today.  

## 2012-10-20 NOTE — Progress Notes (Deleted)
Name: Sandra Turner    DOB: 1941-06-12  Age: 72 y.o.  MR#: [id number]       PCP:  [name], MD      Insurance: Payor: Advertising copywriter MEDICARE  Plan: AARP MEDICARE COMPLETE  Product Type: *No Product type*    CC:   No chief complaint on file.   VS Filed Vitals:   10/20/12 1301  BP: 120/64  Pulse: 84  Height: 5\' 2"  (1.575 m)  Weight: 254 lb (115.214 kg)    Weights Current Weight  10/20/12 254 lb (115.214 kg)  09/15/12 258 lb 4 oz (117.141 kg)  08/27/12 243 lb 2.7 oz (110.3 kg)    Blood Pressure  BP Readings from Last 3 Encounters:  10/20/12 120/64  09/15/12 129/82  08/27/12 154/65     Admit date:  (Not on file) Last encounter with RMR:  09/15/2012   Allergy Penicillins and Latex  Current Outpatient Prescriptions  Medication Sig Dispense Refill  . dabigatran (PRADAXA) 150 MG CAPS Take 1 capsule (150 mg total) by mouth every 12 (twelve) hours.  60 capsule  3   No current facility-administered medications for this visit.    Discontinued Meds:    Medications Discontinued During This Encounter  Medication Reason  . diltiazem (CARDIZEM CD) 180 MG 24 hr capsule Error  . furosemide (LASIX) 40 MG tablet Error  . montelukast (SINGULAIR) 10 MG tablet Error  . potassium chloride SA (K-DUR,KLOR-CON) 20 MEQ tablet Error    Patient Active Problem List  Diagnosis  . Atrial fibrillation  . Dyspnea  . Bronchospasm  . Obesity  . Acute sinusitis  . Aortic stenosis  . Pulmonary hypertension    LABS @BMET3 @  @CMPRESULT3 @ @CBC3 @  Lipid Panel  No results found for this basename: chol, trig, hdl, cholhdl, vldl, ldlcalc    ABG    Component Value Date/Time   PHART 7.492* 08/25/2012 1945   PCO2ART 50.3* 08/25/2012 1945   PO2ART 77.6* 08/25/2012 1945   HCO3 38.1* 08/25/2012 1945   TCO2 32.8 08/25/2012 1945   O2SAT 96.4 08/25/2012 1945     BNP (last 3 results)  Recent Labs  08/22/12 0720 08/24/12 0635  PROBNP 1936.0* 1460.0*   Cardiac Panel (last 3  results) No results found for this basename: CKTOTAL, CKMB, TROPONINI, RELINDX,  in the last 72 hours  Iron/TIBC/Ferritin No results found for this basename: iron, tibc, ferritin     EKG Orders placed in visit on 09/15/12  . EKG 12-LEAD     Prior Assessment and Plan Problem List as of 10/20/2012     ICD-9-CM   Atrial fibrillation   Last Assessment & Plan   09/15/2012 Office Visit Written 09/15/2012  4:05 PM by Jodelle Gross, NP     Heart rater is well controlled currently on dose of cardizem 180 mg daily She is refusing coumadin which is covered by her Medicare complete and will not take Xarelto as it is not covered. Will try her on Pradaxa 150 mg BID in the hopes that this will be covered on her insurance. We had planned to discuss DCCV afte 6 weeks of anticoagulation therapy, but she has not been on the medication for 3 weeks. I have discussed with her the high likelihood of CVA without anticoagulation. She verbalizes understanding is willing to take the risks if her insurance will not cover the novel anticoagulant medications. We have given her samples of Pradaxa 75 mg which she will take 2 of BID. We will see her again  in one month.    Dyspnea   Bronchospasm   Obesity   Acute sinusitis   Aortic stenosis   Last Assessment & Plan   09/15/2012 Office Visit Written 09/15/2012  4:09 PM by Jodelle Gross, NP     Continue current regimen with heart rate control. She has gained 10 lbs which I believe is due to dietary noncompliance, lasix remains at 40 mg BID. Will continue to follow. No evidence of CHF despite mild non-pitting LEE.    Pulmonary hypertension       Imaging: No results found.

## 2012-11-20 ENCOUNTER — Telehealth: Payer: Self-pay | Admitting: Adult Health

## 2012-11-20 NOTE — Telephone Encounter (Signed)
Pt is going to have a tooth pulled and needs to know what to do about her warfarin.

## 2012-11-21 NOTE — Telephone Encounter (Signed)
Have returned call to patient and waiting for call back,  after reviewing chart I see that this patient is on pradaxa and not warfarin.

## 2012-11-21 NOTE — Telephone Encounter (Signed)
Confirmed with [name], Pharm D, holding time for [name] with a good creatinine clearance is 2 days, I still have not received a call from pt to confirm date of dental procedure. I have called pt again and also left a message with relative [name]

## 2012-11-21 NOTE — Telephone Encounter (Signed)
Please advise 

## 2012-11-21 NOTE — Telephone Encounter (Signed)
Talk with Sandra Turner in coumadin clinic. Not sure how long she needs to be off of it. I think 3 days but has to go right back on it.. To be sure ask her.  Thanks!

## 2012-11-21 NOTE — Telephone Encounter (Signed)
Can stop Pradaxa for 5 days and restart ASAP after procedure. Stay on ASA daily.

## 2012-11-23 NOTE — Telephone Encounter (Signed)
Noted  

## 2012-11-24 NOTE — Telephone Encounter (Signed)
Spoke with pt.  Dental procedure is scheduled for 12/22/12.  Told pt to take last dose of Pradaxa on 4/18 and resume night of procedure.  She verbalized understanding.

## 2012-12-09 ENCOUNTER — Emergency Department (HOSPITAL_COMMUNITY)
Admission: EM | Admit: 2012-12-09 | Discharge: 2012-12-09 | Disposition: A | Discharge status: Home / Self Care | Payer: Medicare Other | Attending: Emergency Medicine | Admitting: Emergency Medicine

## 2012-12-09 ENCOUNTER — Encounter (HOSPITAL_COMMUNITY): Payer: Self-pay | Admitting: *Deleted

## 2012-12-09 ENCOUNTER — Emergency Department (HOSPITAL_COMMUNITY): Payer: Medicare Other

## 2012-12-09 DIAGNOSIS — Z8709 Personal history of other diseases of the respiratory system: Secondary | ICD-10-CM | POA: Insufficient documentation

## 2012-12-09 DIAGNOSIS — Z8739 Personal history of other diseases of the musculoskeletal system and connective tissue: Secondary | ICD-10-CM | POA: Insufficient documentation

## 2012-12-09 DIAGNOSIS — J159 Unspecified bacterial pneumonia: Secondary | ICD-10-CM | POA: Insufficient documentation

## 2012-12-09 DIAGNOSIS — J189 Pneumonia, unspecified organism: Secondary | ICD-10-CM

## 2012-12-09 DIAGNOSIS — Z8679 Personal history of other diseases of the circulatory system: Secondary | ICD-10-CM | POA: Insufficient documentation

## 2012-12-09 DIAGNOSIS — Z8701 Personal history of pneumonia (recurrent): Secondary | ICD-10-CM | POA: Insufficient documentation

## 2012-12-09 HISTORY — DX: Pneumonia, unspecified organism: J18.9

## 2012-12-09 LAB — COMPREHENSIVE METABOLIC PANEL
ALT: 18 U/L (ref 0–35)
Albumin: 3.8 g/dL (ref 3.5–5.2)
Alkaline Phosphatase: 110 U/L (ref 39–117)
Glucose, Bld: 103 mg/dL — ABNORMAL HIGH (ref 70–99)
Potassium: 3.8 mEq/L (ref 3.5–5.1)
Sodium: 140 mEq/L (ref 135–145)
Total Protein: 6.9 g/dL (ref 6.0–8.3)

## 2012-12-09 LAB — CBC WITH DIFFERENTIAL/PLATELET
Basophils Relative: 1 % (ref 0–1)
Eosinophils Absolute: 0.1 10*3/uL (ref 0.0–0.7)
Lymphs Abs: 0.8 10*3/uL (ref 0.7–4.0)
MCH: 29.4 pg (ref 26.0–34.0)
Neutrophils Relative %: 73 % (ref 43–77)
Platelets: 174 10*3/uL (ref 150–400)
RBC: 3.94 MIL/uL (ref 3.87–5.11)
WBC: 5.4 10*3/uL (ref 4.0–10.5)

## 2012-12-09 MED ORDER — ALBUTEROL SULFATE HFA 108 (90 BASE) MCG/ACT IN AERS: 1.0000 | INHALATION_SPRAY | Freq: Four times a day (QID) | RESPIRATORY_TRACT | Status: DC | PRN

## 2012-12-09 MED ORDER — AZITHROMYCIN 250 MG PO TABS: ORAL_TABLET | ORAL | Status: DC

## 2012-12-09 MED ORDER — DEXTROSE 5 % IV SOLN: 1.0000 g | Freq: Once | INTRAVENOUS | Status: DC

## 2012-12-09 MED ORDER — DEXTROSE 5 % IV SOLN
500.0000 mg | Freq: Once | INTRAVENOUS | Status: AC
  Administered 2012-12-09: 500 mg via INTRAVENOUS
  Filled 2012-12-09: qty 500

## 2012-12-09 NOTE — ED Notes (Signed)
Sob for 2 weeks,  Pneumonia in December with flu.  Sl cough, productive of yellow sputum, no fever.  No n/v  No pain

## 2012-12-09 NOTE — ED Provider Notes (Addendum)
History     This chart was scribed for [name], MD, MD by Smitty Pluck, ED Scribe. The patient was seen in room APA06/APA06 and the patient's care was started at 11:24 AM.   CSN: [id number]  Arrival date & time 12/09/12  1056      Chief Complaint  Patient presents with  . Shortness of Breath     The history is provided by the patient. No language interpreter was used.   Sandra Turner is a 72 y.o. female with hx of atrial fib who presents to the Emergency Department complaining of worsening moderate SOB onset 2 weeks ago. She states she has SOB after walking 4-5 steps which is abnormal for her. She reports having mild productive cough with yellow sputum. Pt reports having pneumonia in December 2013 and was admitted to hospital for 5 days. She states that she has sick contact with customers at [employer]. She mentions that she normally has swelling in her legs. Pt denies chest pain, fever, chills, nausea, vomiting, diarrhea, weakness and any other pain.pt reports that she takes pradaxa for atrial fibrillation. Hx of atrial fib  Cardiologist is at [facility] PCP is at [facility]   Past Medical History  Diagnosis Date  . Arthritis   . Pneumonia   . Bronchitis   . Arrhythmia     Past Surgical History  Procedure Laterality Date  . None      Family History  Problem Relation Age of Onset  . Breast cancer Mother   . Congestive Heart Failure Mother   . Cancer Father     History  Substance Use Topics  . Smoking status: Never Smoker   . Smokeless tobacco: Not on file  . Alcohol Use: No    OB History   Grav Para Term Preterm Abortions TAB SAB Ect Mult Living                  Review of Systems 10 Systems reviewed and all are negative for acute change except as noted in the HPI.   Allergies  Penicillins and Latex  Home Medications   Current Outpatient Rx  Name  Route  Sig  Dispense  Refill  . dabigatran (PRADAXA) 150 MG CAPS   Oral   Take 1 capsule (150  mg total) by mouth every 12 (twelve) hours.   60 capsule   3     BP 141/72  Pulse 74  Temp(Src) 99.2 F (37.3 C) (Oral)  Resp 22  Ht 5\' 2"  (1.575 m)  Wt 250 lb (113.399 kg)  BMI 45.71 kg/m2  SpO2 97%  Physical Exam  Nursing note and vitals reviewed. Constitutional: She is oriented to person, place, and time. She appears well-developed and well-nourished.  HENT:  Head: Normocephalic and atraumatic.  Eyes: Conjunctivae and EOM are normal. Pupils are equal, round, and reactive to light.  Neck: Normal range of motion. Neck supple.  Cardiovascular: Normal rate, regular rhythm and normal heart sounds.   Pulmonary/Chest: Effort normal and breath sounds normal.  Abdominal: Soft. Bowel sounds are normal.  Musculoskeletal: Normal range of motion. She exhibits edema (+3 bilateral legs).  Neurological: She is alert and oriented to person, place, and time.  Skin: Skin is warm and dry.  Psychiatric: She has a normal mood and affect.    ED Course  Procedures (including critical care time) DIAGNOSTIC STUDIES: Oxygen Saturation is 97% on room air, normal by my interpretation.    COORDINATION OF CARE: 11:27 AM Discussed  ED treatment with pt and pt agrees labs and chest xray.   Results for orders placed during the hospital encounter of 12/09/12  CBC WITH DIFFERENTIAL      Result Value Range   WBC 5.4  4.0 - 10.5 K/uL   RBC 3.94  3.87 - 5.11 MIL/uL   Hemoglobin 11.6 (*) 12.0 - 15.0 g/dL   HCT 16.1 (*) 09.6 - 04.5 %   MCV 90.4  78.0 - 100.0 fL   MCH 29.4  26.0 - 34.0 pg   MCHC 32.6  30.0 - 36.0 g/dL   RDW 40.9  81.1 - 91.4 %   Platelets 174  150 - 400 K/uL   Neutrophils Relative 73  43 - 77 %   Neutro Abs 3.9  1.7 - 7.7 K/uL   Lymphocytes Relative 16  12 - 46 %   Lymphs Abs 0.8  0.7 - 4.0 K/uL   Monocytes Relative 8  3 - 12 %   Monocytes Absolute 0.4  0.1 - 1.0 K/uL   Eosinophils Relative 3  0 - 5 %   Eosinophils Absolute 0.1  0.0 - 0.7 K/uL   Basophils Relative 1  0 - 1 %    Basophils Absolute 0.0  0.0 - 0.1 K/uL  COMPREHENSIVE METABOLIC PANEL      Result Value Range   Sodium 140  135 - 145 mEq/L   Potassium 3.8  3.5 - 5.1 mEq/L   Chloride 103  96 - 112 mEq/L   CO2 28  19 - 32 mEq/L   Glucose, Bld 103 (*) 70 - 99 mg/dL   BUN 11  6 - 23 mg/dL   Creatinine, Ser 7.82  0.50 - 1.10 mg/dL   Calcium 9.4  8.4 - 95.6 mg/dL   Total Protein 6.9  6.0 - 8.3 g/dL   Albumin 3.8  3.5 - 5.2 g/dL   AST 25  0 - 37 U/L   ALT 18  0 - 35 U/L   Alkaline Phosphatase 110  39 - 117 U/L   Total Bilirubin 0.8  0.3 - 1.2 mg/dL   GFR calc non Af Amer 87 (*) >90 mL/min   GFR calc Af Amer >90  >90 mL/min  TROPONIN I      Result Value Range   Troponin I <0.30  <0.30 ng/mL   Dg Chest 1 View  12/09/2012  *RADIOLOGY REPORT*  Clinical Data: 2-week history of shortness of breath.  Prior history of pneumonia in December, 2013.  CHEST - 1 VIEW  Comparison: CTA chest 08/26/2012.  Portable chest x-rays 08/24/2012, 08/22/2012.  Findings: Patchy airspace opacities throughout the right lung and in the left lung base.  Left upper lobe remains relatively clear. Cardiac silhouette mildly enlarged but stable.  Pulmonary vascularity normal.  Thoracic aorta atherosclerotic, unchanged. Small right pleural effusions suspected.  IMPRESSION: Patchy pneumonia throughout the right lung and in the left lung base.  Stable mild cardiomegaly without evidence of pulmonary edema.   Original Report Authenticated By: Hulan Saas, M.D.     Date: 12/09/2012  Rate: 76  Rhythm: atrial fibrillation  QRS Axis: left  Intervals: normal  ST/T Wave abnormalities: normal  Conduction Disutrbances:none  Narrative Interpretation:   Old EKG Reviewed: changes noted     No results found.   No diagnosis found.    MDM  Patient is hemodynamically stable.  Chest x-ray reveals pneumonia, worse on the right side.   She has good color and is not obviously dyspneic. Rx IV  Rocephin and IV Zithromax in ED. Discharge meds  Zithromax for 5 additional days    I personally performed the services described in this documentation, which was scribed in my presence. The recorded information has been reviewed and is accurate.      [name], MD 12/09/12 1503  [name], MD 12/09/12 [phone]

## 2013-01-30 ENCOUNTER — Other Ambulatory Visit: Payer: Self-pay | Admitting: Adult Health

## 2013-04-20 ENCOUNTER — Ambulatory Visit: Payer: Medicare Other | Admitting: Adult Health

## 2013-04-30 ENCOUNTER — Other Ambulatory Visit: Payer: Self-pay | Admitting: *Deleted

## 2013-04-30 MED ORDER — DABIGATRAN ETEXILATE MESYLATE 150 MG PO CAPS: ORAL_CAPSULE | ORAL | Status: DC

## 2013-05-01 ENCOUNTER — Other Ambulatory Visit: Payer: Self-pay

## 2013-05-18 ENCOUNTER — Ambulatory Visit (INDEPENDENT_AMBULATORY_CARE_PROVIDER_SITE_OTHER): Payer: Medicare Other | Admitting: Adult Health

## 2013-05-18 ENCOUNTER — Encounter: Payer: Self-pay | Admitting: *Deleted

## 2013-05-18 ENCOUNTER — Encounter: Payer: Self-pay | Admitting: Adult Health

## 2013-05-18 VITALS — BP 152/76 | HR 75 | Ht 60.0 in | Wt 260.0 lb

## 2013-05-18 DIAGNOSIS — I4891 Unspecified atrial fibrillation: Secondary | ICD-10-CM

## 2013-05-18 DIAGNOSIS — I359 Nonrheumatic aortic valve disorder, unspecified: Secondary | ICD-10-CM

## 2013-05-18 DIAGNOSIS — I350 Nonrheumatic aortic (valve) stenosis: Secondary | ICD-10-CM

## 2013-05-18 NOTE — Assessment & Plan Note (Signed)
She is rate controlled. Has some complaints of diarrhea with use of Pradaxa. She is not complaining of DOE or fluttering. She is to continue current medications. She will be seen in 6 months.

## 2013-05-18 NOTE — Progress Notes (Deleted)
Name: Sandra Turner    DOB: July 14, 1941  Age: 72 y.o.  MR#: [id number]       PCP:  [name], MD      Insurance: Payor: Advertising copywriter MEDICARE / Plan: AARP MEDICARE COMPLETE / Product Type: *No Product type* /   CC:    Chief Complaint  Patient presents with  . Atrial Fibrillation  . Aortic Insuffiency    VS Filed Vitals:   05/18/13 1337  BP: 152/76  Pulse: 75  Height: 5' (1.524 m)  Weight: 260 lb (117.935 kg)    Weights Current Weight  05/18/13 260 lb (117.935 kg)  12/09/12 250 lb (113.399 kg)  10/20/12 254 lb (115.214 kg)    Blood Pressure  BP Readings from Last 3 Encounters:  05/18/13 152/76  12/09/12 125/60  10/20/12 120/64     Admit date:  (Not on file) Last encounter with RMR:  01/30/2013   Allergy Penicillins and Latex  Current Outpatient Prescriptions  Medication Sig Dispense Refill  . dabigatran (PRADAXA) 150 MG CAPS capsule TAKE ONE CAPSULE BY MOUTH EVERY 12 HOURS  30 capsule  6   No current facility-administered medications for this visit.    Discontinued Meds:    Medications Discontinued During This Encounter  Medication Reason  . albuterol (PROVENTIL HFA;VENTOLIN HFA) 108 (90 BASE) MCG/ACT inhaler   . Aspirin-Caffeine (BC FAST PAIN RELIEF PO)   . azithromycin (ZITHROMAX Z-PAK) 250 MG tablet     Patient Active Problem List   Diagnosis Date Noted  . Aortic stenosis 08/25/2012  . Pulmonary hypertension 08/25/2012  . Atrial fibrillation 08/22/2012  . Dyspnea 08/22/2012  . Bronchospasm 08/22/2012  . Obesity 08/22/2012  . Acute sinusitis 08/22/2012    LABS    Component Value Date/Time   NA 140 12/09/2012 1141   NA 136 08/27/2012 0622   NA 139 08/26/2012 0554   K 3.8 12/09/2012 1141   K 3.9 08/27/2012 0622   K 3.8 08/26/2012 0554   CL 103 12/09/2012 1141   CL 91* 08/27/2012 0622   CL 92* 08/26/2012 0554   CO2 28 12/09/2012 1141   CO2 39* 08/27/2012 0622   CO2 40* 08/26/2012 0554   GLUCOSE 103* 12/09/2012 1141   GLUCOSE 153* 08/27/2012 0622    GLUCOSE 139* 08/26/2012 0554   BUN 11 12/09/2012 1141   BUN 29* 08/27/2012 0622   BUN 27* 08/26/2012 0554   CREATININE 0.66 12/09/2012 1141   CREATININE 0.83 08/27/2012 0622   CREATININE 0.83 08/26/2012 0554   CALCIUM 9.4 12/09/2012 1141   CALCIUM 9.7 08/27/2012 0622   CALCIUM 9.4 08/26/2012 0554   GFRNONAA 87* 12/09/2012 1141   GFRNONAA 70* 08/27/2012 0622   GFRNONAA 70* 08/26/2012 0554   GFRAA >90 12/09/2012 1141   GFRAA 81* 08/27/2012 0622   GFRAA 81* 08/26/2012 0554   CMP     Component Value Date/Time   NA 140 12/09/2012 1141   K 3.8 12/09/2012 1141   CL 103 12/09/2012 1141   CO2 28 12/09/2012 1141   GLUCOSE 103* 12/09/2012 1141   BUN 11 12/09/2012 1141   CREATININE 0.66 12/09/2012 1141   CALCIUM 9.4 12/09/2012 1141   PROT 6.9 12/09/2012 1141   ALBUMIN 3.8 12/09/2012 1141   AST 25 12/09/2012 1141   ALT 18 12/09/2012 1141   ALKPHOS 110 12/09/2012 1141   BILITOT 0.8 12/09/2012 1141   GFRNONAA 87* 12/09/2012 1141   GFRAA >90 12/09/2012 1141       Component Value Date/Time   WBC  5.4 12/09/2012 1141   WBC 8.4 08/27/2012 0622   WBC 6.1 08/25/2012 0523   HGB 11.6* 12/09/2012 1141   HGB 13.3 08/27/2012 0622   HGB 11.9* 08/25/2012 0523   HCT 35.6* 12/09/2012 1141   HCT 40.6 08/27/2012 0622   HCT 37.2 08/25/2012 0523   MCV 90.4 12/09/2012 1141   MCV 90.8 08/27/2012 0622   MCV 92.1 08/25/2012 0523    Lipid Panel  No results found for this basename: chol, trig, hdl, cholhdl, vldl, ldlcalc    ABG    Component Value Date/Time   PHART 7.492* 08/25/2012 1945   PCO2ART 50.3* 08/25/2012 1945   PO2ART 77.6* 08/25/2012 1945   HCO3 38.1* 08/25/2012 1945   TCO2 32.8 08/25/2012 1945   O2SAT 96.4 08/25/2012 1945     Lab Results  Component Value Date   TSH 0.916 08/22/2012   BNP (last 3 results)  Recent Labs  08/22/12 0720 08/24/12 0635  PROBNP 1936.0* 1460.0*   Cardiac Panel (last 3 results) No results found for this basename: CKTOTAL, CKMB, TROPONINI, RELINDX,  in the last 72 hours   Iron/TIBC/Ferritin No results found for this basename: iron, tibc, ferritin     EKG Orders placed during the hospital encounter of 12/09/12  . ED EKG  . ED EKG  . EKG 12-LEAD  . EKG 12-LEAD  . EKG  . EKG     Prior Assessment and Plan Problem List as of 05/18/2013     Cardiovascular and Mediastinum   Atrial fibrillation   Last Assessment & Plan   10/20/2012 Office Visit Written 10/20/2012  2:12 PM by Jodelle Gross, NP     Heart rate is well controlled on diltiazem.  She continues on pradaxa without evidence of bleeding or melena.  She will continue on current regimen with follow up with Dr.McDowell to discuss DCCV if she remains in atrial fib.     Aortic stenosis   Last Assessment & Plan   10/20/2012 Office Visit Written 10/20/2012  2:14 PM by Jodelle Gross, NP     She continues asymptomatic for chest pain or DOE with AoV stenosis, described as moderate per echo in 09/2010. Will continue current medical management with repeat echo in one year unless symptomatic.    Pulmonary hypertension     Respiratory   Bronchospasm   Acute sinusitis     Other   Dyspnea   Obesity       Imaging: No results found.

## 2013-05-18 NOTE — Patient Instructions (Signed)
Your physician recommends that you schedule a follow-up appointment in: 6 months You will receive a reminder letter two months in advance reminding you to call and schedule your appointment. If you don't receive this letter, please contact our office.  Your physician recommends that you continue on your current medications as directed. Please refer to the Current Medication list given to you today.     

## 2013-05-18 NOTE — Assessment & Plan Note (Signed)
Will continue serial echo's on an annual basis. Sooner if she becomes symptomatic.

## 2013-05-18 NOTE — Progress Notes (Signed)
   HPI: Sandra Turner is a 72 year old patient formerly of [name], we are seeing for ongoing assessment and management of chronic atrial fibrillation, aortic valve stenosis, and hypertension. The patient refuses Coumadin therapy, but is continued on Primaxin twice a day. She was last seen in the office in February of 2014. At that time the patient was doing well with good heart rate control. She was asymptomatic    She offers no complaints of chest pain or rapid HR. She is medically compliant. She has occasional LEE fluid retention for which she takes a prn diuretic.   Allergies  Allergen Reactions  . Penicillins Other (See Comments)    Unknown  . Latex Rash    Current Outpatient Prescriptions  Medication Sig Dispense Refill  . dabigatran (PRADAXA) 150 MG CAPS capsule TAKE ONE CAPSULE BY MOUTH EVERY 12 HOURS  30 capsule  6   No current facility-administered medications for this visit.    Past Medical History  Diagnosis Date  . Arthritis   . Pneumonia   . Bronchitis   . Arrhythmia     Past Surgical History  Procedure Laterality Date  . None      WJX:BJYNWG of systems complete and found to be negative unless listed above  PHYSICAL EXAM BP 152/76  Pulse 75  Ht 5' (1.524 m)  Wt 260 lb (117.935 kg)  BMI 50.78 kg/m2  General: Well developed, well nourished, in no acute distress Head: Eyes PERRLA, No xanthomas.   Normal cephalic and atramatic  Lungs: Clear bilaterally to auscultation and percussion. Heart: HRIR S1 S2, 2/6 systolic murmur. .  Pulses are 2+ & equal.           Radiation carotid bruit. No JVD.  No abdominal bruits. No femoral bruits. Abdomen: Bowel sounds are positive, abdomen soft and non-tender without masses or                  Hernia's noted. Msk:  Back normal, normal gait. Normal strength and tone for age with significant stiffiness in the knees and back.. Extremities: No clubbing, cyanosis 1+ edema.  DP +1 Neuro: Alert and oriented X 3. Psych:  Good  affect, responds appropriately :  ASSESSMENT AND PLAN

## 2013-08-24 ENCOUNTER — Telehealth: Payer: Self-pay

## 2013-08-24 ENCOUNTER — Telehealth: Payer: Self-pay | Admitting: Adult Health

## 2013-08-24 ENCOUNTER — Other Ambulatory Visit: Payer: Self-pay

## 2013-08-24 MED ORDER — DABIGATRAN ETEXILATE MESYLATE 150 MG PO CAPS: ORAL_CAPSULE | ORAL | Status: DC

## 2013-08-24 NOTE — Telephone Encounter (Signed)
rx called to pharmacy 

## 2013-08-24 NOTE — Telephone Encounter (Signed)
Received fax refill request  Rx # [id number] Medication:  pradaxa 150 mg cap Qty 30 Sig:  Take one capsule by mouth every 12 hours  Physician:  Lyman Bishop

## 2013-08-24 NOTE — Telephone Encounter (Signed)
dabigatran (PRADAXA) 150 MG CAPS capsule  Patient needs refill.  Took last pill yesterday.

## 2013-09-03 DIAGNOSIS — Z8709 Personal history of other diseases of the respiratory system: Secondary | ICD-10-CM

## 2013-09-03 HISTORY — DX: Personal history of other diseases of the respiratory system: Z87.09

## 2013-09-22 ENCOUNTER — Emergency Department (HOSPITAL_COMMUNITY): Payer: Medicare Other

## 2013-09-22 ENCOUNTER — Encounter (HOSPITAL_COMMUNITY): Payer: Self-pay | Admitting: Emergency Medicine

## 2013-09-22 ENCOUNTER — Inpatient Hospital Stay (HOSPITAL_COMMUNITY)
Admission: EM | Admit: 2013-09-22 | Discharge: 2013-09-25 | DRG: 292 | Disposition: A | Discharge status: Home / Self Care | Payer: Medicare Other | Attending: Family Medicine | Admitting: Family Medicine

## 2013-09-22 DIAGNOSIS — E669 Obesity, unspecified: Secondary | ICD-10-CM

## 2013-09-22 DIAGNOSIS — I4891 Unspecified atrial fibrillation: Secondary | ICD-10-CM | POA: Diagnosis present

## 2013-09-22 DIAGNOSIS — I350 Nonrheumatic aortic (valve) stenosis: Secondary | ICD-10-CM

## 2013-09-22 DIAGNOSIS — I272 Pulmonary hypertension, unspecified: Secondary | ICD-10-CM | POA: Diagnosis present

## 2013-09-22 DIAGNOSIS — I359 Nonrheumatic aortic valve disorder, unspecified: Secondary | ICD-10-CM | POA: Diagnosis present

## 2013-09-22 DIAGNOSIS — D649 Anemia, unspecified: Secondary | ICD-10-CM | POA: Diagnosis present

## 2013-09-22 DIAGNOSIS — Z6841 Body Mass Index (BMI) 40.0 and over, adult: Secondary | ICD-10-CM

## 2013-09-22 DIAGNOSIS — Z803 Family history of malignant neoplasm of breast: Secondary | ICD-10-CM

## 2013-09-22 DIAGNOSIS — Z8249 Family history of ischemic heart disease and other diseases of the circulatory system: Secondary | ICD-10-CM

## 2013-09-22 DIAGNOSIS — I2789 Other specified pulmonary heart diseases: Secondary | ICD-10-CM | POA: Diagnosis present

## 2013-09-22 DIAGNOSIS — Z8701 Personal history of pneumonia (recurrent): Secondary | ICD-10-CM

## 2013-09-22 DIAGNOSIS — R0989 Other specified symptoms and signs involving the circulatory and respiratory systems: Secondary | ICD-10-CM

## 2013-09-22 DIAGNOSIS — R0600 Dyspnea, unspecified: Secondary | ICD-10-CM

## 2013-09-22 DIAGNOSIS — I5033 Acute on chronic diastolic (congestive) heart failure: Principal | ICD-10-CM

## 2013-09-22 DIAGNOSIS — J441 Chronic obstructive pulmonary disease with (acute) exacerbation: Secondary | ICD-10-CM | POA: Diagnosis present

## 2013-09-22 DIAGNOSIS — R0609 Other forms of dyspnea: Secondary | ICD-10-CM

## 2013-09-22 DIAGNOSIS — M129 Arthropathy, unspecified: Secondary | ICD-10-CM | POA: Diagnosis present

## 2013-09-22 DIAGNOSIS — I509 Heart failure, unspecified: Secondary | ICD-10-CM | POA: Diagnosis present

## 2013-09-22 HISTORY — DX: Chronic obstructive pulmonary disease, unspecified: J44.9

## 2013-09-22 HISTORY — DX: Nonrheumatic aortic (valve) stenosis: I35.0

## 2013-09-22 HISTORY — DX: Chronic atrial fibrillation, unspecified: I48.20

## 2013-09-22 LAB — CBC WITH DIFFERENTIAL/PLATELET
BASOS ABS: 0 10*3/uL (ref 0.0–0.1)
BASOS PCT: 1 % (ref 0–1)
Eosinophils Absolute: 0.2 10*3/uL (ref 0.0–0.7)
Eosinophils Relative: 3 % (ref 0–5)
HCT: 31.5 % — ABNORMAL LOW (ref 36.0–46.0)
HEMOGLOBIN: 10 g/dL — AB (ref 12.0–15.0)
LYMPHS PCT: 14 % (ref 12–46)
Lymphs Abs: 0.9 10*3/uL (ref 0.7–4.0)
MCH: 28.7 pg (ref 26.0–34.0)
MCHC: 31.7 g/dL (ref 30.0–36.0)
MCV: 90.3 fL (ref 78.0–100.0)
Monocytes Absolute: 0.6 10*3/uL (ref 0.1–1.0)
Monocytes Relative: 9 % (ref 3–12)
NEUTROS ABS: 4.5 10*3/uL (ref 1.7–7.7)
NEUTROS PCT: 73 % (ref 43–77)
Platelets: 202 10*3/uL (ref 150–400)
RBC: 3.49 MIL/uL — ABNORMAL LOW (ref 3.87–5.11)
RDW: 14.3 % (ref 11.5–15.5)
WBC: 6.2 10*3/uL (ref 4.0–10.5)

## 2013-09-22 LAB — COMPREHENSIVE METABOLIC PANEL
ALBUMIN: 3.6 g/dL (ref 3.5–5.2)
ALK PHOS: 107 U/L (ref 39–117)
ALT: 18 U/L (ref 0–35)
AST: 24 U/L (ref 0–37)
BILIRUBIN TOTAL: 0.4 mg/dL (ref 0.3–1.2)
BUN: 18 mg/dL (ref 6–23)
CHLORIDE: 105 meq/L (ref 96–112)
CO2: 29 mEq/L (ref 19–32)
Calcium: 9.3 mg/dL (ref 8.4–10.5)
Creatinine, Ser: 0.79 mg/dL (ref 0.50–1.10)
GFR calc Af Amer: >90 mL/min (ref >90)
GFR calc non Af Amer: 82 mL/min — ABNORMAL LOW (ref >90)
Glucose, Bld: 96 mg/dL (ref 70–99)
POTASSIUM: 4.3 meq/L (ref 3.7–5.3)
Sodium: 143 mEq/L (ref 137–147)
Total Protein: 6.6 g/dL (ref 6.0–8.3)

## 2013-09-22 LAB — TROPONIN I: Troponin I: <0.3 ng/mL (ref <0.30)

## 2013-09-22 LAB — D-DIMER, QUANTITATIVE: D-Dimer, Quant: 0.29 ug/mL-FEU (ref 0.00–0.48)

## 2013-09-22 LAB — PRO B NATRIURETIC PEPTIDE: Pro B Natriuretic peptide (BNP): 1604 pg/mL — ABNORMAL HIGH (ref 0–125)

## 2013-09-22 MED ORDER — FUROSEMIDE 10 MG/ML IJ SOLN
40.0000 mg | Freq: Once | INTRAMUSCULAR | Status: AC
  Administered 2013-09-22: 40 mg via INTRAVENOUS
  Filled 2013-09-22: qty 4

## 2013-09-22 MED ORDER — ALBUTEROL SULFATE (2.5 MG/3ML) 0.083% IN NEBU
5.0000 mg | INHALATION_SOLUTION | Freq: Once | RESPIRATORY_TRACT | Status: AC
  Administered 2013-09-22: 5 mg via RESPIRATORY_TRACT
  Filled 2013-09-22: qty 6

## 2013-09-22 MED ORDER — IPRATROPIUM BROMIDE 0.02 % IN SOLN
0.5000 mg | Freq: Once | RESPIRATORY_TRACT | Status: AC
  Administered 2013-09-22: 0.5 mg via RESPIRATORY_TRACT
  Filled 2013-09-22: qty 2.5

## 2013-09-22 MED ORDER — METHYLPREDNISOLONE SODIUM SUCC 125 MG IJ SOLR
125.0000 mg | Freq: Once | INTRAMUSCULAR | Status: AC
  Administered 2013-09-22: 125 mg via INTRAVENOUS
  Filled 2013-09-22: qty 2

## 2013-09-22 NOTE — ED Provider Notes (Signed)
CSN: [id number]     Arrival date & time 09/22/13  1516 History  This chart was scribed for Benny Lennert, MD by Ronal Fear, ED Scribe. This patient was seen in room APA10/APA10 and the patient's care was started at 5:00 PM.     Chief Complaint  Patient presents with  . Shortness of Breath   (Consider location/radiation/quality/duration/timing/severity/associated sxs/prior Treatment) Patient is a 73 y.o. female presenting with shortness of breath. The history is provided by the patient. No language interpreter was used.  Shortness of Breath Severity:  Mild Onset quality:  Gradual Duration:  3 months Timing:  Constant Progression:  Unchanged Chronicity:  Recurrent Context: activity   Relieved by:  Inhaler and sitting up Worsened by:  Exertion Associated symptoms: no abdominal pain, no chest pain, no cough, no diaphoresis, no headaches and no rash    She was seen at urgent care today and told to come to the ED because of her pulse rate. She was given a breathing treatment with relief. Pt has a hx of A-Fib  [name], MD Heart doctor at [facility]   Past Medical History  Diagnosis Date  . Arthritis   . Pneumonia   . Bronchitis   . Arrhythmia    Past Surgical History  Procedure Laterality Date  . None     Family History  Problem Relation Age of Onset  . Breast cancer Mother   . Congestive Heart Failure Mother   . Cancer Father    History  Substance Use Topics  . Smoking status: Never Smoker   . Smokeless tobacco: Not on file  . Alcohol Use: No   OB History   Grav Para Term Preterm Abortions TAB SAB Ect Mult Living                 Review of Systems  Constitutional: Negative for diaphoresis, appetite change and fatigue.  HENT: Negative for congestion, ear discharge and sinus pressure.   Eyes: Negative for discharge.  Respiratory: Positive for shortness of breath. Negative for cough.   Cardiovascular: Negative for chest pain.  Gastrointestinal: Negative for  abdominal pain and diarrhea.  Genitourinary: Negative for frequency and hematuria.  Musculoskeletal: Negative for back pain.  Skin: Negative for rash.  Neurological: Negative for seizures and headaches.  Psychiatric/Behavioral: Negative for hallucinations.  All other systems reviewed and are negative.    Allergies  Penicillins and Latex  Home Medications   Current Outpatient Rx  Name  Route  Sig  Dispense  Refill  . dabigatran (PRADAXA) 150 MG CAPS capsule      TAKE ONE CAPSULE BY MOUTH EVERY 12 HOURS   30 capsule   6    BP 141/65  Pulse 75  Temp(Src) 98.3 F (36.8 C)  Resp 20  Ht 5\' 3"  (1.6 m)  Wt 270 lb (122.471 kg)  BMI 47.84 kg/m2  SpO2 97% Physical Exam  Constitutional: She is oriented to person, place, and time. She appears well-developed.  HENT:  Head: Normocephalic.  Eyes: Conjunctivae and EOM are normal. No scleral icterus.  Neck: Neck supple. No thyromegaly present.  Cardiovascular: Normal rate and regular rhythm.  Exam reveals no gallop and no friction rub.   No murmur heard. Pulmonary/Chest: No stridor. She has no wheezes. She has no rales. She exhibits no tenderness.  Abdominal: She exhibits no distension. There is no tenderness. There is no rebound.  Musculoskeletal: Normal range of motion. She exhibits edema.  1+ edema in bilateral ankles  Lymphadenopathy:  She has no cervical adenopathy.  Neurological: She is oriented to person, place, and time. She exhibits normal muscle tone. Coordination normal.  Skin: No rash noted. No erythema.  Psychiatric: She has a normal mood and affect. Her behavior is normal.    ED Course  Procedures (including critical care time)  DIAGNOSTIC STUDIES: Oxygen Saturation is 97% on RA, adequate by my interpretation.    COORDINATION OF CARE: 5:04 PM- Pt advised of plan for treatment and pt agrees.    Labs Review Labs Reviewed - No data to display Imaging Review No results found.  EKG Interpretation     Date/Time:  Tuesday September 22 2013 17:47:09 EST Ventricular Rate:  72 PR Interval:    QRS Duration: 74 QT Interval:  382 QTC Calculation: 418 R Axis:   -70 Text Interpretation:  Atrial fibrillation Left axis deviation Low voltage QRS Inferior infarct , age undetermined Cannot rule out Anteroseptal infarct (cited on or before 22-Aug-2012) Abnormal ECG When compared with ECG of 09-Dec-2012 14:31, Inferior infarct is now Present Confirmed by Lashante Fryberger  MD, Luzelena Heeg ([id number]) on 09/22/2013 8:20:57 PM            MDM  Chf,  admit    Benny LennertJoseph L Ashyra Cantin, MD 09/22/13 2211

## 2013-09-22 NOTE — ED Notes (Signed)
Patient ambulated to restroom and was wheezing  With a o2 sat of 88. Will notify md

## 2013-09-22 NOTE — ED Notes (Signed)
Shortness of breath with exertion 

## 2013-09-23 ENCOUNTER — Encounter (HOSPITAL_COMMUNITY): Payer: Self-pay | Admitting: Cardiology

## 2013-09-23 DIAGNOSIS — I4891 Unspecified atrial fibrillation: Secondary | ICD-10-CM

## 2013-09-23 DIAGNOSIS — I5033 Acute on chronic diastolic (congestive) heart failure: Principal | ICD-10-CM

## 2013-09-23 DIAGNOSIS — I509 Heart failure, unspecified: Secondary | ICD-10-CM | POA: Insufficient documentation

## 2013-09-23 DIAGNOSIS — I359 Nonrheumatic aortic valve disorder, unspecified: Secondary | ICD-10-CM

## 2013-09-23 LAB — COMPREHENSIVE METABOLIC PANEL
ALT: 16 U/L (ref 0–35)
AST: 24 U/L (ref 0–37)
Albumin: 3.5 g/dL (ref 3.5–5.2)
Alkaline Phosphatase: 105 U/L (ref 39–117)
BUN: 18 mg/dL (ref 6–23)
CALCIUM: 9.1 mg/dL (ref 8.4–10.5)
CO2: 29 meq/L (ref 19–32)
CREATININE: 0.85 mg/dL (ref 0.50–1.10)
Chloride: 103 mEq/L (ref 96–112)
GFR calc Af Amer: 78 mL/min — ABNORMAL LOW (ref >90)
GFR, EST NON AFRICAN AMERICAN: 67 mL/min — AB (ref >90)
GLUCOSE: 159 mg/dL — AB (ref 70–99)
Potassium: 4.3 mEq/L (ref 3.7–5.3)
Sodium: 143 mEq/L (ref 137–147)
Total Bilirubin: 0.4 mg/dL (ref 0.3–1.2)
Total Protein: 6.7 g/dL (ref 6.0–8.3)

## 2013-09-23 LAB — CBC
HCT: 32.3 % — ABNORMAL LOW (ref 36.0–46.0)
HEMOGLOBIN: 10 g/dL — AB (ref 12.0–15.0)
MCH: 28.2 pg (ref 26.0–34.0)
MCHC: 31 g/dL (ref 30.0–36.0)
MCV: 91 fL (ref 78.0–100.0)
PLATELETS: 206 10*3/uL (ref 150–400)
RBC: 3.55 MIL/uL — AB (ref 3.87–5.11)
RDW: 14.1 % (ref 11.5–15.5)
WBC: 3.9 10*3/uL — ABNORMAL LOW (ref 4.0–10.5)

## 2013-09-23 MED ORDER — DABIGATRAN ETEXILATE MESYLATE 150 MG PO CAPS
150.0000 mg | ORAL_CAPSULE | Freq: Two times a day (BID) | ORAL | Status: DC
  Administered 2013-09-23 – 2013-09-25 (×6): 150 mg via ORAL
  Filled 2013-09-23 (×13): qty 1

## 2013-09-23 MED ORDER — SODIUM CHLORIDE 0.9 % IV SOLN: 250.0000 mL | INTRAVENOUS | Status: DC | PRN

## 2013-09-23 MED ORDER — FUROSEMIDE 10 MG/ML IJ SOLN
20.0000 mg | Freq: Two times a day (BID) | INTRAMUSCULAR | Status: DC
  Administered 2013-09-24: 20 mg via INTRAVENOUS
  Filled 2013-09-23: qty 2

## 2013-09-23 MED ORDER — DABIGATRAN ETEXILATE MESYLATE 150 MG PO CAPS
ORAL_CAPSULE | ORAL | Status: AC
  Filled 2013-09-23: qty 1

## 2013-09-23 MED ORDER — LEVALBUTEROL HCL 0.63 MG/3ML IN NEBU
0.6300 mg | INHALATION_SOLUTION | Freq: Four times a day (QID) | RESPIRATORY_TRACT | Status: DC
  Administered 2013-09-23 – 2013-09-25 (×11): 0.63 mg via RESPIRATORY_TRACT
  Filled 2013-09-23 (×11): qty 3

## 2013-09-23 MED ORDER — IPRATROPIUM BROMIDE 0.02 % IN SOLN
0.5000 mg | Freq: Four times a day (QID) | RESPIRATORY_TRACT | Status: DC
  Administered 2013-09-23 – 2013-09-25 (×11): 0.5 mg via RESPIRATORY_TRACT
  Filled 2013-09-23 (×11): qty 2.5

## 2013-09-23 MED ORDER — SODIUM CHLORIDE 0.9 % IJ SOLN
3.0000 mL | INTRAMUSCULAR | Status: DC | PRN
Start: 2013-09-23 — End: 2013-09-25

## 2013-09-23 MED ORDER — ACETAMINOPHEN 650 MG RE SUPP
650.0000 mg | Freq: Four times a day (QID) | RECTAL | Status: DC | PRN
Start: 2013-09-23 — End: 2013-09-25

## 2013-09-23 MED ORDER — DABIGATRAN ETEXILATE MESYLATE 150 MG PO CAPS
150.0000 mg | ORAL_CAPSULE | Freq: Two times a day (BID) | ORAL | Status: DC
  Filled 2013-09-23: qty 1

## 2013-09-23 MED ORDER — SODIUM CHLORIDE 0.9 % IJ SOLN
3.0000 mL | Freq: Two times a day (BID) | INTRAMUSCULAR | Status: DC
  Administered 2013-09-23 – 2013-09-25 (×6): 3 mL via INTRAVENOUS

## 2013-09-23 MED ORDER — ACETAMINOPHEN 325 MG PO TABS: 650.0000 mg | ORAL_TABLET | Freq: Four times a day (QID) | ORAL | Status: DC | PRN

## 2013-09-23 NOTE — Progress Notes (Signed)
[facility] PROGRESS NOTE  Sandra Turner FIE:[id number] DOB: [birth year] DOA: 09/22/2013 PCP: Ernestine ConradBLUTH, [name], MD  Summary: 73 year old woman present with increasing weight gain shortness of breath. Admitted for acute heart failure.  Assessment/Plan: 1. Acute on chronic diastolic congestive heart failure. Improving rapidly with IV diuresis. Cardiology consultation appreciated. Takes Lasix only as needed at home. Weight up 10 pounds from last visit. Likely complicated by recent steroid use. 2. Atrial fibrillation. Stable without rate control agents. Continue Pradaxa. 3. Moderate aortic stenosis with pulmonary hypertension. Followup 2-D echocardiogram.   Overall improving. Likely home next 48 hours.  Continue IV diuresis per cardiology. Followup 2-D echocardiogram.  BMP in AM  Code Status: full code DVT prophylaxis: Pradaxa Family Communication: None present Disposition Plan: home when improved  [name], MD  [facility]  Pager [phone] If 7PM-7AM, please contact night-coverage at [email or web address], password [email or web address] 09/23/2013, 5:42 PM  LOS: 1 day   Consultants:  Cardiology  Procedures:  2-D echocardiogram  Antibiotics:    HPI/Subjective: Overall feeling much better.  Objective: Filed Vitals:   09/23/13 0653 09/23/13 0721 09/23/13 1407 09/23/13 1500  BP: 134/69   119/49  Pulse: 76   86  Temp: 98.6 F (37 C)   98.3 F (36.8 C)  TempSrc: Oral     Resp: 15   15  Height:      Weight:      SpO2: 98% 98% 94% 99%    Intake/Output Summary (Last 24 hours) at 09/23/13 1742 Last data filed at 09/23/13 1300  Gross per 24 hour  Intake    480 ml  Output   2050 ml  Net  -1570 ml     Filed Weights   09/22/13 1624 09/22/13 2300  Weight: 122.471 kg (270 lb) 119.976 kg (264 lb 8 oz)    Exam:   Afebrile, vital signs stable. No hypoxia.  General: Appears calm and comfortable.  Cardiovascular: Regular rate and rhythm. No murmur, rub or gallop. 2+  bilateral lower extremity edema.  Respiratory clear to auscultation bilaterally. No wheezes, rales or rhonchi. Normal respiratory effort.  Psychiatric grossly normal mood and affect. Speech fluent and appropriate.  Data Reviewed:  Weight down 3 kg.  -1.5 L  Complete metabolic panel unremarkable  Hemoglobin stable 10.0  Chest x-ray suggested pulmonary edema  Scheduled Meds: . dabigatran  150 mg Oral Q12H  . [START ON 09/24/2013] furosemide  20 mg Intravenous Q12H  . ipratropium  0.5 mg Nebulization Q6H  . levalbuterol  0.63 mg Nebulization Q6H  . sodium chloride  3 mL Intravenous Q12H   Continuous Infusions:   Principal Problem:   Acute on chronic diastolic heart failure Active Problems:   Atrial fibrillation   Pulmonary hypertension   Time spent 20 minutes

## 2013-09-23 NOTE — Progress Notes (Signed)
Utilization Review Complete  

## 2013-09-23 NOTE — H&P (Signed)
PCP:   Celedonio Savage, MD   Chief Complaint:  Shortness of breath  HPI: 73 year old female with a history of A. fib who has been having shortness of breath since Thanksgiving. As per patient she was prescribed keloids and antibiotics after she will shortness of breath, bronchitis at Thanksgiving but did not improve and then she went to urgent care again in December where she got more steroids and antibiotics and inhalers without much improvement. Him to the hospital with worsening shortness of breath, her O2 sats went down to 80s on ambulation.  Patient has a history of A. fib and is currently on anticoagulation with pradaxa. She denies chest pain no nausea vomiting or diarrhea no fever. Patient's echo from 2013 showed pulmonary artery hypertension, tricuspid regurgitation aortic stenosis.   Allergies:   Allergies  Allergen Reactions  . Penicillins Other (See Comments)    Unknown  . Latex Rash      Past Medical History  Diagnosis Date  . Arthritis   . Pneumonia   . Bronchitis   . Arrhythmia     Past Surgical History  Procedure Laterality Date  . None      Prior to Admission medications   Medication Sig Start Date End Date Taking? Authorizing Provider  ALPHA LIPOIC ACID PO Take 1 capsule by mouth daily.   Yes Historical Provider, MD  Ascorbic Acid (VITAMIN C PO) Take 2 tablets by mouth daily.   Yes Historical Provider, MD  Aspirin-Salicylamide-Caffeine (BC HEADACHE POWDER PO) Take 2 packets by mouth daily.   Yes Historical Provider, MD  CALCIUM PO Take 2 tablets by mouth daily.   Yes Historical Provider, MD  dabigatran (PRADAXA) 150 MG CAPS capsule TAKE ONE CAPSULE BY MOUTH EVERY 12 HOURS 08/24/13  Yes Lendon Colonel, NP  folic acid (FOLVITE) 1 MG tablet Take 1 mg by mouth daily.   Yes Historical Provider, MD  GINKGO BILOBA PO Take 2 tablets by mouth daily.   Yes Historical Provider, MD  Nyoka Cowden Tea, Camillia sinensis, (GREEN TEA PO) Take 1 tablet by mouth daily.   Yes  Historical Provider, MD  MAGNESIUM PO Take 2 tablets by mouth daily.   Yes Historical Provider, MD  Methylsulfonylmethane (MSM PO) Take 3 tablets by mouth daily.   Yes Historical Provider, MD  Multiple Vitamins-Minerals (ZINC PO) Take 1 tablet by mouth daily.   Yes Historical Provider, MD  POTASSIUM PO Take 1 tablet by mouth daily.   Yes Historical Provider, MD  VITAMIN A PO Take 1 tablet by mouth daily.   Yes Historical Provider, MD  VITAMIN E PO Take 1 tablet by mouth daily.   Yes Historical Provider, MD  furosemide (LASIX) 20 MG tablet Take 1 tablet by mouth daily as needed for fluid.  09/10/13   Historical Provider, MD    Social History:  reports that she has never smoked. She does not have any smokeless tobacco history on file. She reports that she does not drink alcohol or use illicit drugs.  Family History  Problem Relation Age of Onset  . Breast cancer Mother   . Congestive Heart Failure Mother   . Cancer Father      All the positives are listed in BOLD  Review of Systems:  HEENT: Headache, blurred vision, runny nose, sore throat Neck: Hypothyroidism, hyperthyroidism,,lymphadenopathy Chest : Shortness of breath, history of COPD, Asthma Heart : Chest pain, history of coronary arterey disease GI:  Nausea, vomiting, diarrhea, constipation, GERD GU: Dysuria, urgency, frequency of urination, hematuria  Neuro: Stroke, seizures, syncope Psych: Depression, anxiety, hallucinations   Physical Exam: Blood pressure 175/79, pulse 80, temperature 98.9 F (37.2 C), temperature source Oral, resp. rate 20, height 5' 3"  (1.6 m), weight 119.976 kg (264 lb 8 oz), SpO2 95.00%. Constitutional:   Patient is a well-developed and well-nourished female* in no acute distress and cooperative with exam. Head: Normocephalic and atraumatic Mouth: Mucus membranes moist Eyes: PERRL, EOMI, conjunctivae normal Neck: Supple, No Thyromegaly Cardiovascular: RRR, S1 normal, S2 normal Pulmonary/Chest:  Bibasilar crackles Abdominal: Soft. Non-tender, non-distended, bowel sounds are normal, no masses, organomegaly, or guarding present.  Neurological: A&O x3, Strenght is normal and symmetric bilaterally, cranial nerve II-XII are grossly intact, no focal motor deficit, sensory intact to light touch bilaterally.  Extremities : 1+ edema in the lower extremities   Labs on Admission:  Results for orders placed during the hospital encounter of 09/22/13 (from the past 48 hour(s))  CBC WITH DIFFERENTIAL     Status: Abnormal   Collection Time    09/22/13  5:39 PM      Result Value Range   WBC 6.2  4.0 - 10.5 K/uL   RBC 3.49 (*) 3.87 - 5.11 MIL/uL   Hemoglobin 10.0 (*) 12.0 - 15.0 g/dL   HCT 31.5 (*) 36.0 - 46.0 %   MCV 90.3  78.0 - 100.0 fL   MCH 28.7  26.0 - 34.0 pg   MCHC 31.7  30.0 - 36.0 g/dL   RDW 14.3  11.5 - 15.5 %   Platelets 202  150 - 400 K/uL   Neutrophils Relative % 73  43 - 77 %   Neutro Abs 4.5  1.7 - 7.7 K/uL   Lymphocytes Relative 14  12 - 46 %   Lymphs Abs 0.9  0.7 - 4.0 K/uL   Monocytes Relative 9  3 - 12 %   Monocytes Absolute 0.6  0.1 - 1.0 K/uL   Eosinophils Relative 3  0 - 5 %   Eosinophils Absolute 0.2  0.0 - 0.7 K/uL   Basophils Relative 1  0 - 1 %   Basophils Absolute 0.0  0.0 - 0.1 K/uL  COMPREHENSIVE METABOLIC PANEL     Status: Abnormal   Collection Time    09/22/13  5:39 PM      Result Value Range   Sodium 143  137 - 147 mEq/L   Potassium 4.3  3.7 - 5.3 mEq/L   Chloride 105  96 - 112 mEq/L   CO2 29  19 - 32 mEq/L   Glucose, Bld 96  70 - 99 mg/dL   BUN 18  6 - 23 mg/dL   Creatinine, Ser 0.79  0.50 - 1.10 mg/dL   Calcium 9.3  8.4 - 10.5 mg/dL   Total Protein 6.6  6.0 - 8.3 g/dL   Albumin 3.6  3.5 - 5.2 g/dL   AST 24  0 - 37 U/L   ALT 18  0 - 35 U/L   Alkaline Phosphatase 107  39 - 117 U/L   Total Bilirubin 0.4  0.3 - 1.2 mg/dL   GFR calc non Af Amer 82 (*) >90 mL/min   GFR calc Af Amer >90  >90 mL/min   Comment: (NOTE)     The eGFR has been  calculated using the CKD EPI equation.     This calculation has not been validated in all clinical situations.     eGFR's persistently <90 mL/min signify possible Chronic Kidney     Disease.  TROPONIN I     Status: None   Collection Time    09/22/13  5:39 PM      Result Value Range   Troponin I <0.30  <0.30 ng/mL   Comment:            Due to the release kinetics of cTnI,     a negative result within the first hours     of the onset of symptoms does not rule out     myocardial infarction with certainty.     If myocardial infarction is still suspected,     repeat the test at appropriate intervals.  D-DIMER, QUANTITATIVE     Status: None   Collection Time    09/22/13  5:39 PM      Result Value Range   D-Dimer, Quant 0.29  0.00 - 0.48 ug/mL-FEU   Comment:            AT THE INHOUSE ESTABLISHED CUTOFF     VALUE OF 0.48 ug/mL FEU,     THIS ASSAY HAS BEEN DOCUMENTED     IN THE LITERATURE TO HAVE     A SENSITIVITY AND NEGATIVE     PREDICTIVE VALUE OF AT LEAST     98 TO 99%.  THE TEST RESULT     SHOULD BE CORRELATED WITH     AN ASSESSMENT OF THE CLINICAL     PROBABILITY OF DVT / VTE.  PRO B NATRIURETIC PEPTIDE     Status: Abnormal   Collection Time    09/22/13  5:39 PM      Result Value Range   Pro B Natriuretic peptide (BNP) 1604.0 (*) 0 - 125 pg/mL    Radiological Exams on Admission: Dg Chest Portable 1 View  09/22/2013   CLINICAL DATA:  Worsening shortness of breath and congestion.  EXAM: PORTABLE CHEST - 1 VIEW  COMPARISON:  Chest radiograph performed earlier today at 1:25 p.m.  FINDINGS: The lungs are well-aerated. Vascular congestion is noted. Chronically increased interstitial markings are seen. There is suggestion of superimposed airspace opacity, which may reflect mild interstitial edema. No definite pleural effusion or pneumothorax is seen.  The cardiomediastinal silhouette is mildly enlarged. No acute osseous abnormalities are seen.  IMPRESSION: Mildly worsened increased  interstitial markings, with underlying vascular congestion and mild cardiomegaly. This may reflect mild pulmonary edema, superimposed on patient's chronic interstitial changes.   Electronically Signed   By: Garald Balding M.D.   On: 09/22/2013 21:21    Assessment/Plan Principal Problem:   CHF (congestive heart failure) Active Problems:   Atrial fibrillation   Pulmonary hypertension   CHF exacerbation  Dyspnea Likely multifactorial with a component of CHF, patient had elevated BNP today of 09/08/2002 the chest x-ray showing mild pulmonary edema superimposed on chronic interstitial changes. We'll start the patient on Lasix 20 g IV every 12 hours along with DuoNeb nebulizers every 6 hours. Patient does not have any history of COPD, will consult cardiology in the morning.  A. Fib Rate is controlled, we'll continue with anticoagulation with Pradaxa.   Code status: Patient is full code  Family discussion: No family at bedside   Time Spent on Admission: 34 minutes  LAMA,GAGAN S [facility] Pager: [phone] 09/23/2013, 12:31 AM  If 7PM-7AM, please contact night-coverage  [email or web address]  Password [email or web address]

## 2013-09-23 NOTE — Clinical Documentation Improvement (Signed)
Please clarify. Thank you  Possible Clinical conditions  Morbid Obesity W/ BMI 47.84  Other condition___________________  Cannot clinically determine _____________  Risk Factors: History of CHF, A fib, and pulmonary hypertension  Sign & Symptoms: Ht.  5'3"  Wt.  270 lbs  BMI 47.84  Diagnostics: BNP:  1604 08/22/13 Echo: EF =  55-60% CXR:  IMPRESSION:  Mildly worsened increased interstitial markings, with underlying  vascular congestion and mild cardiomegaly. This may reflect mild  pulmonary edema, superimposed on patient's chronic interstitial  changes.  Treatment I&O monitored [employer] [facility] diet  Thank You, Harless Littenebora T Nealie Mchatton ,RN Clinical Documentation Specialist:  [phone]  [facility] Information Management

## 2013-09-23 NOTE — Consult Note (Signed)
CARDIOLOGY CONSULT NOTE   Patient ID: Sandra Turner MRN: [id number] DOB/AGE: May 26, 1941 73 y.o.  Admit Date: 09/22/2013 Referring Physician: PTH Primary Physician: Ernestine ConradBLUTH, [name], MD Consulting Cardiologist: Nona DellMcDowell, Lovett Coffin MD Reason for Consultation: CHF with atrial fibrillation  Clinical Summary Ms. Sandra Turner is a 73 y.o.female admitted with worsening dyspnea and CHF symptoms. She has known history of AF on Pradaxa and moderate AV stenosis per echo in 2013 with pulmonary hypertension. She was treated for respiratory infection in December with antibiotics and steroids by PCP Dr. Margo Commonapper. She presented to Urgent Care when she could not get follow up appt, and was given a second course of abx, and steroids, with "a shot." She was to follow up if she was not better after finishing treatment. She had  no appreciable improvement in symptoms. Presented again to Urgent Care with CXR demonstrating pulmonary edema. She was referred for admission.  On admission she was found to be mildly anemic (Hgb 10.0) compared to prior Hgb of 11.6 in April with elevated Pro-BNP of 1,604. CXR demonstrated underlying vascular congestion, mild pulmonary edema and chronic interstitial changes. EKG demonstrated Atrial fib with left axis deviation. Cardiac enzymes were found to be negative X 2. She was given IV Lasix 40 mg along with steroids and nebulizer treatments. Per documentation she has diuresed 2.050 cc  since admission.  On last documented wt  In our office (/14), she was 260 lbs. Admission wt 270 lbs.     She states that she takes lasix as needed, but does not like taking it daily. She does not add salt to food, but does eat whatever she likes. She is feeling and breathing better now.      Allergies  Allergen Reactions  . Penicillins Other (See Comments)    Unknown  . Latex Rash    Medications Scheduled Medications: . dabigatran  150 mg Oral Q12H  . [START ON 09/24/2013] furosemide  20 mg Intravenous Q12H    . ipratropium  0.5 mg Nebulization Q6H  . levalbuterol  0.63 mg Nebulization Q6H  . sodium chloride  3 mL Intravenous Q12H     PRN Medications: sodium chloride, acetaminophen, acetaminophen, sodium chloride   Past Medical History  Diagnosis Date  . Arthritis   . History of pneumonia   . COPD (chronic obstructive pulmonary disease)   . Chronic atrial fibrillation   . Aortic stenosis     Moderate 2013    Past Surgical History  Procedure Laterality Date  . None      Family History  Problem Relation Age of Onset  . Breast cancer Mother   . Congestive Heart Failure Mother   . Cancer Father     Social History Ms. Sandra Turner reports that she has never smoked. She does not have any smokeless tobacco history on file. Ms. Sandra Turner reports that she does not drink alcohol.   Review of Systems Otherwise reviewed and negative except as outlined.  Physical Examination Blood pressure 134/69, pulse 76, temperature 98.6 F (37 C), temperature source Oral, resp. rate 15, height 5\' 3"  (1.6 m), weight 264 lb 8 oz (119.976 kg), SpO2 98.00%.  Intake/Output Summary (Last 24 hours) at 09/23/13 1241 Last data filed at 09/23/13 0600  Gross per 24 hour  Intake      0 ml  Output   2050 ml  Net  -2050 ml    Telemetry: Atrial fib rates in the 70's.  Appears comfortable at rest. HEENT: Conjunctiva and lids normal, oropharynx clear. Neck:  Supple, no elevated JVP or carotid bruits, no thyromegaly. Lungs: Clear to auscultation, nonlabored breathing at rest. Cardiac: Iregular rate and rhythm, 3/6 systolic murmur, no pericardial rub. Abdomen: Soft, nontender,  bowel sounds present. Extremities: 1+ pitting edema, with venous stasis skin changes distal pulses 2+. Skin: Warm and dry. Musculoskeletal: No kyphosis. Neuropsychiatric: Alert and oriented x3, affect grossly appropriate.   Prior Cardiac Testing/Procedures 1.Echocardigram: 08/2012  Left ventricle: The cavity size was at the upper  limits of normal. Borderline concentric hypertrophy. Overall systolic function was normal. The estimated ejection fraction was in the range of 55% to 60%. - Ventricular septum: Mild to moderate diastolic septal flattening; less impressive in systolic . Along with the elevated velocity measured for tricuspid regurgitation, this suggests the presence of significant pulmonary hypertension. - Aortic valve: Mildly to moderately calcified annulus. Moderately thickened, moderately calcified leaflets. Cusp separation was moderately reduced. There was moderate stenosis. Valve area: 1.19cm^2(VTI). Valve area: 0.99cm^2 (Vmax). - Mitral valve: Calcified annulus. - Left atrium: The atrium was moderately dilated. - Right atrium: The atrium was mildly dilated. - Pulmonary arteries: PA peak pressure: 69mm Hg (S).   Lab Results  Basic Metabolic Panel:  Recent Labs Lab 09/22/13 1739 09/23/13 0544  NA 143 143  K 4.3 4.3  CL 105 103  CO2 29 29  GLUCOSE 96 159*  BUN 18 18  CREATININE 0.79 0.85  CALCIUM 9.3 9.1    Liver Function Tests:  Recent Labs Lab 09/22/13 1739 09/23/13 0544  AST 24 24  ALT 18 16  ALKPHOS 107 105  BILITOT 0.4 0.4  PROT 6.6 6.7  ALBUMIN 3.6 3.5    CBC:  Recent Labs Lab 09/22/13 1739 09/23/13 0544  WBC 6.2 3.9*  NEUTROABS 4.5  --   HGB 10.0* 10.0*  HCT 31.5* 32.3*  MCV 90.3 91.0  PLT 202 206    Cardiac Enzymes:  Recent Labs Lab 09/22/13 1739  TROPONINI <0.30     Radiology: Dg Chest Portable 1 View  09/22/2013   CLINICAL DATA:  Worsening shortness of breath and congestion.  EXAM: PORTABLE CHEST - 1 VIEW  COMPARISON:  Chest radiograph performed earlier today at 1:25 p.m.  FINDINGS: The lungs are well-aerated. Vascular congestion is noted. Chronically increased interstitial markings are seen. There is suggestion of superimposed airspace opacity, which may reflect mild interstitial edema. No definite pleural effusion or pneumothorax is seen.  The  cardiomediastinal silhouette is mildly enlarged. No acute osseous abnormalities are seen.  IMPRESSION: Mildly worsened increased interstitial markings, with underlying vascular congestion and mild cardiomegaly. This may reflect mild pulmonary edema, superimposed on patient's chronic interstitial changes.   Electronically Signed   By: Roanna Raider M.D.   On: 09/22/2013 21:21    ECG: Atrial fibrillation   Impression and Recommendations  1. Apparent acute on chronic diastolic CHF: Fluid overload with worsening symptoms over one month, with DOE and edema. Has been treated for bronchitis with steroids antibiotics for a month. She is only taking lasix as needed. Wt up 10 lbs from last office visit. She has had improvement in symptoms since admission with IV lasix diureses. Lungs are clear, but remains edematous in the LE. Will continue IV lasix for an additional 24 hours. Echo has been ordered to evaluate progression AoV stenosis.    2. Atrial fibrillation: Heart rate is well controlled currently. She is not on rate control medications at this time or at home. Continue Pradaxa.  3. Moderate aortic stenosis: Last echocardiogram in 2013. Will be repeated  to assess for progression/  3. Chronic Bronchitis: Now on steroid inhalers and injections. Per PTH  Signed: Bettey Mare. Lyman Bishop NP [facility] 09/23/2013, 12:41 PM Co-Sign MD   Attending note:  Patient seen and examined. Reviewed records and modified above note by Ms. Lawrence NP. Patient presents with probable acute on chronic diastolic heart failure with fluid overload complicated by a recent URI and steroid use, only occasional use of Lasix at home. She has chronic atrial fibrillation which has been rate controlled, on Pradaxa as well. History of moderate aortic stenosis as of 2013. No evidence of ACS at this time. Plan to continue IV diuresis, followup echocardiogram to evaluate for potential progression in aortic stenosis, also reassess  LVEF.  [name], M.D., F.A.C.C.

## 2013-09-24 DIAGNOSIS — I359 Nonrheumatic aortic valve disorder, unspecified: Secondary | ICD-10-CM

## 2013-09-24 LAB — BASIC METABOLIC PANEL
BUN: 26 mg/dL — AB (ref 6–23)
CHLORIDE: 103 meq/L (ref 96–112)
CO2: 29 mEq/L (ref 19–32)
CREATININE: 0.84 mg/dL (ref 0.50–1.10)
Calcium: 8.8 mg/dL (ref 8.4–10.5)
GFR calc Af Amer: 79 mL/min — ABNORMAL LOW (ref >90)
GFR calc non Af Amer: 68 mL/min — ABNORMAL LOW (ref >90)
GLUCOSE: 107 mg/dL — AB (ref 70–99)
POTASSIUM: 3.8 meq/L (ref 3.7–5.3)
Sodium: 143 mEq/L (ref 137–147)

## 2013-09-24 MED ORDER — FUROSEMIDE 20 MG PO TABS
20.0000 mg | ORAL_TABLET | Freq: Every day | ORAL | Status: DC
  Administered 2013-09-25: 20 mg via ORAL
  Filled 2013-09-24: qty 1

## 2013-09-24 MED ORDER — PREDNISONE 20 MG PO TABS
20.0000 mg | ORAL_TABLET | Freq: Every day | ORAL | Status: DC
  Administered 2013-09-24 – 2013-09-25 (×2): 20 mg via ORAL
  Filled 2013-09-24 (×2): qty 1

## 2013-09-24 NOTE — Progress Notes (Signed)
   Please see full echocardiogram report from today. LVEF remains in normal range, however aortic stenosis has progressed to the severe range since 2013. Discussed this with the patient including implications for considering valve replacement in general. She has improved clinically from the perspective of diastolic heart failure, and likely should be able to go home tomorrow. She will however need close followup in the office for further evaluation of her aortic valve disease.   Jonelle SidleSamuel G. Claxton Levitz, M.D., F.A.C.C.

## 2013-09-24 NOTE — Progress Notes (Signed)
*  PRELIMINARY RESULTS* Echocardiogram 2D Echocardiogram has been performed.  Sandra Turner 09/24/2013, 10:07 AM

## 2013-09-24 NOTE — Progress Notes (Signed)
09/24/13 1643 Patient reminded to leave nuns cap in place for urine output measurement. Pt sometimes removes nuns cap when voiding accounting for urine output occurences charted. Will remind as needed. Earnstine RegalAshley Bodhi Stenglein, RN

## 2013-09-24 NOTE — Progress Notes (Signed)
Consulting cardiologist: Dr. Jonelle SidleSamuel G. McDowell   Subjective:    Still having wheezing and coughing. Breathing better. "I think its breaking up."  Objective:   Temp:  [97.9 F (36.6 C)-98.3 F (36.8 C)] 97.9 F (36.6 C) (01/22 0500) Pulse Rate:  [73-86] 73 (01/22 0500) Resp:  [15-20] 16 (01/22 0500) BP: (119-129)/(49-75) 128/75 mmHg (01/22 0500) SpO2:  [92 %-99 %] 93 % (01/22 0703) Weight:  [272 lb (123.378 kg)] 272 lb (123.378 kg) (01/22 0955) Last BM Date: 09/22/13  Filed Weights   09/22/13 1624 09/22/13 2300 09/24/13 0955  Weight: 270 lb (122.471 kg) 264 lb 8 oz (119.976 kg) 272 lb (123.378 kg)    Intake/Output Summary (Last 24 hours) at 09/24/13 1049 Last data filed at 09/24/13 1005  Gross per 24 hour  Intake    483 ml  Output    100 ml  Net    383 ml    Telemetry: Atrial fib in the 80's.   Exam:  General: No acute distress.  Lungs: Wheezes and coughing. No distress.  Cardiac: RRR, 3/6 systolic murmur, no gallop or rub.   Extremities: 1+ pre-tibial pitting edema, with venous statis skin discoloration. distal pulses full.   Lab Results:  Basic Metabolic Panel:  Recent Labs Lab 09/22/13 1739 09/23/13 0544 09/24/13 0541  NA 143 143 143  K 4.3 4.3 3.8  CL 105 103 103  CO2 29 29 29   GLUCOSE 96 159* 107*  BUN 18 18 26*  CREATININE 0.79 0.85 0.84  CALCIUM 9.3 9.1 8.8    Liver Function Tests:  Recent Labs Lab 09/22/13 1739 09/23/13 0544  AST 24 24  ALT 18 16  ALKPHOS 107 105  BILITOT 0.4 0.4  PROT 6.6 6.7  ALBUMIN 3.6 3.5    CBC:  Recent Labs Lab 09/22/13 1739 09/23/13 0544  WBC 6.2 3.9*  HGB 10.0* 10.0*  HCT 31.5* 32.3*  MCV 90.3 91.0  PLT 202 206    Cardiac Enzymes:  Recent Labs Lab 09/22/13 1739  TROPONINI <0.30    Radiology: CXR 09/22/2013 IMPRESSION: Mildly worsened increased interstitial markings, with underlying vascular congestion and mild cardiomegaly. This may reflect mild pulmonary edema, superimposed  on patient's chronic interstitial changes.    Medications:   Scheduled Medications: . dabigatran  150 mg Oral Q12H  . furosemide  20 mg Intravenous Q12H  . ipratropium  0.5 mg Nebulization Q6H  . levalbuterol  0.63 mg Nebulization Q6H  . sodium chloride  3 mL Intravenous Q12H    PRN Medications: sodium chloride, acetaminophen, acetaminophen, sodium chloride   Assessment and Plan:   1. Acute on Chronic Diastolic CHF: She continues to have LEE but is diuresing. Wt is going down. Wheezes are prominent. On lasix 20 mg IV BID. Home dose is 20 mg daily. Doubt diastolic CHF is related to breathing  Would transition to po lasix 20 mg daily.   2. Atrial fib: Rate is controlled currently. Continue Pradaxa.  3. Chronic Bronchitis: Continue respiratory treatments with steroids and abx per PTH.   4. Aortic stenosis: Moderate as of 2013. Followup echocardiogram pending.  Bettey MareKathryn M. Lyman BishopLawrence NP [facility] 09/24/2013, 10:49 AM   Attending note:  Patient has been seen and examined. Modified above note by Ms. Lawrence NP. She is feeling somewhat better, weight is down with diuresis, although continues to have wheezing with bronchitis. Atrial fibrillation rate remains well controlled. Followup echocardiogram is pending for reassessment of degree of aortic stenosis.  Illene BolusSamuel G.  Domenic Polite, M.D., F.A.C.C.

## 2013-09-24 NOTE — Plan of Care (Signed)
Problem: Phase I Progression Outcomes Goal: EF % per last Echo/documented,Core Reminder form on chart Outcome: Completed/Met Date Met:  09/24/13 12215 1350 2D echo completed this morning. Results pending.

## 2013-09-24 NOTE — Progress Notes (Signed)
[facility] PROGRESS NOTE  Sandra Turner WNU:[id number] DOB: 10-22-1940 DOA: 09/22/2013 PCP: Sandra ConradBLUTH, [name], MD  Summary: 73 year old woman present with increasing weight gain shortness of breath. Admitted for acute heart failure.  Assessment/Plan: 1. Acute on chronic diastolic congestive heart failure. Improving withdiuresis. Cardiology consultation appreciated. Takes Lasix only as needed at home. Weight up 10 pounds from last visit. Likely complicated by recent steroid use. Now on oral Lasix per cardiology. 2. Atrial fibrillation. Stable without rate control agents. Continue Pradaxa. 3. Recent bronchitis. [place] with steroids as an outpatient. Some wheezes. Seems to be responding to nebulizers. Given heart failure, low dose steroids as well. 4. Severe aortic stenosis 5. Morbid Obesity W/ BMI 47.84   Slowly improving. Likely discharge next 24 hours.  Continue diuresis per cardiology. Follow final recommendations based on echocardiogram and clinical status.  BMP in AM  Code Status: full code DVT prophylaxis: Pradaxa Family Communication: None present Disposition Plan: home when improved  Sandra Sacksaniel Keene Gilkey, MD  [facility]  Pager [phone] If 7PM-7AM, please contact night-coverage at [email or web address], password [email or web address] 09/24/2013, 3:08 PM  LOS: 2 days   Consultants:  Cardiology  Procedures:  2-D echocardiogram left ventricular ejection fraction 60-65%. Normal wall motion. Grade 2 diastolic dysfunction. Severe left atrial enlargement. Serial calcific aortic stenosis.  Antibiotics:    HPI/Subjective: Continues to feel better. Still has some wheezing and cough. Breathing better. Less lower extremity edema.   Objective: Filed Vitals:   09/24/13 0500 09/24/13 0703 09/24/13 0955 09/24/13 1429  BP: 128/75     Pulse: 73     Temp: 97.9 F (36.6 C)     TempSrc: Oral     Resp: 16     Height:      Weight:   123.378 kg (272 lb)   SpO2: 93% 93%  94%    Intake/Output  Summary (Last 24 hours) at 09/24/13 1508 Last data filed at 09/24/13 1005  Gross per 24 hour  Intake    243 ml  Output    100 ml  Net    143 ml     Filed Weights   09/22/13 1624 09/22/13 2300 09/24/13 0955  Weight: 122.471 kg (270 lb) 119.976 kg (264 lb 8 oz) 123.378 kg (272 lb)    Exam:   Afebrile, vital signs stable. No hypoxia.  General:Appears better. Sitting on side of bed. Nontoxic.  Cardiovascular: Regular rate and rhythm. 3/6 loud systolic murmur. No rub or gallop. 2+ bilateral lower extremity edema without change.  Respiratory: Bilateral wheezes. No rhonchi or rales. Normal respiratory effort.  Data Reviewed:  Weights inaccurate  Urine output unrecorded  Basic  metabolic panel unremarkable  Scheduled Meds: . dabigatran  150 mg Oral Q12H  . furosemide  20 mg Oral Daily  . ipratropium  0.5 mg Nebulization Q6H  . levalbuterol  0.63 mg Nebulization Q6H  . sodium chloride  3 mL Intravenous Q12H   Continuous Infusions:   Principal Problem:   Acute on chronic diastolic heart failure Active Problems:   Atrial fibrillation   Pulmonary hypertension   Time spent 15 minutes

## 2013-09-25 DIAGNOSIS — E669 Obesity, unspecified: Secondary | ICD-10-CM

## 2013-09-25 LAB — BASIC METABOLIC PANEL
BUN: 19 mg/dL (ref 6–23)
CO2: 28 meq/L (ref 19–32)
CREATININE: 0.74 mg/dL (ref 0.50–1.10)
Calcium: 9.2 mg/dL (ref 8.4–10.5)
Chloride: 104 mEq/L (ref 96–112)
GFR calc Af Amer: >90 mL/min (ref >90)
GFR calc non Af Amer: 84 mL/min — ABNORMAL LOW (ref >90)
GLUCOSE: 109 mg/dL — AB (ref 70–99)
Potassium: 4.1 mEq/L (ref 3.7–5.3)
Sodium: 142 mEq/L (ref 137–147)

## 2013-09-25 MED ORDER — FUROSEMIDE 20 MG PO TABS: 20.0000 mg | ORAL_TABLET | Freq: Every day | ORAL | Status: DC

## 2013-09-25 MED ORDER — PREDNISONE 20 MG PO TABS: 20.0000 mg | ORAL_TABLET | Freq: Every day | ORAL | Status: DC

## 2013-09-25 NOTE — Progress Notes (Signed)
Consulting cardiologist: Dr. Jonelle SidleSamuel G. Estes Lehner  Subjective:    No palpitations, chest pain. Breathing is better. Still intermittent cough.  Objective:   Temp:  [98 F (36.7 C)-98.7 F (37.1 C)] 98 F (36.7 C) (01/23 0430) Pulse Rate:  [84-92] 84 (01/23 0430) Resp:  [16] 16 (01/23 0430) BP: (136-144)/(56-77) 136/56 mmHg (01/23 0430) SpO2:  [93 %-97 %] 97 % (01/23 0701) Last BM Date: 09/22/13  Filed Weights   09/22/13 1624 09/22/13 2300 09/24/13 0955  Weight: 270 lb (122.471 kg) 264 lb 8 oz (119.976 kg) 272 lb (123.378 kg)    Intake/Output Summary (Last 24 hours) at 09/25/13 1011 Last data filed at 09/25/13 0842  Gross per 24 hour  Intake    960 ml  Output      0 ml  Net    960 ml    Telemetry: Atrial fibrillation.  Exam:  General: No distress.  Lungs: Coarse breath sounds.  Cardiac: Irregularly irregular, 3/6 systolic murmur.  Extremities: No pitting.   Lab Results:  Basic Metabolic Panel:  Recent Labs Lab 09/23/13 0544 09/24/13 0541 09/25/13 0551  NA 143 143 142  K 4.3 3.8 4.1  CL 103 103 104  CO2 29 29 28   GLUCOSE 159* 107* 109*  BUN 18 26* 19  CREATININE 0.85 0.84 0.74  CALCIUM 9.1 8.8 9.2    Liver Function Tests:  Recent Labs Lab 09/22/13 1739 09/23/13 0544  AST 24 24  ALT 18 16  ALKPHOS 107 105  BILITOT 0.4 0.4  PROT 6.6 6.7  ALBUMIN 3.6 3.5    CBC:  Recent Labs Lab 09/22/13 1739 09/23/13 0544  WBC 6.2 3.9*  HGB 10.0* 10.0*  HCT 31.5* 32.3*  MCV 90.3 91.0  PLT 202 206    Echocardiogram (1/22): Study Conclusions  - Left ventricle: The cavity size was normal. Wall thickness was increased in a pattern of mild LVH. Systolic function was normal. The estimated ejection fraction was in the range of 60% to 65%. Wall motion was normal; there were no regional wall motion abnormalities. Features are consistent with a pseudonormal left ventricular filling pattern, with concomitant abnormal relaxation and increased  filling pressure (grade 2 diastolic dysfunction). Doppler parameters are consistent with elevated ventricular end-diastolic filling pressure. - Aortic valve: Trileaflet; severely calcified leaflets. There was severe stenosis. No significant regurgitation. Mean gradient: 48mm Hg (S). Peak gradient: 75mm Hg (S). Valve area: 0.8cm^2 (Vmax). - Mitral valve: Moderately calcified annulus. Mildly thickened leaflets . Mild regurgitation. - Left atrium: The atrium was severely dilated. - Right ventricle: The cavity size was moderately dilated. Systolic function was low normal. - Right atrium: The atrium was moderately to severely dilated. Central venous pressure: 15mm Hg (est). - Tricuspid valve: Mild regurgitation. - Pulmonary arteries: Systolic pressure was moderately to severely increased. PA peak pressure: 59mm Hg (S). - Pericardium, extracardiac: There was no pericardial effusion. Impressions:  - Mild LVH with LVEF 60-65%, grade 2 diastolic dysfunction with increased filling pressure. Severe left atrial enlargement. Moderate MAC with mild mitral regurgitation. Severe calcific aortc stenosis as outlined above - progressed since prior study in 2013. Moderate to severe right atrial enlargement. Moderately enlarged RV. Mild tricuuspid regurgitation with PASP 59 mmHg.    Medications:   Scheduled Medications: . dabigatran  150 mg Oral Q12H  . furosemide  20 mg Oral Daily  . ipratropium  0.5 mg Nebulization Q6H  . levalbuterol  0.63 mg Nebulization Q6H  . predniSONE  20 mg Oral Q breakfast  .  sodium chloride  3 mL Intravenous Q12H      PRN Medications:  sodium chloride, acetaminophen, acetaminophen, sodium chloride   Assessment:   1. Acute on chronic diastolic heart failure, clinically improved. She is on oral Lasix.  2. Chronic atrial fibrillation, rate is controlled and on Pradaxa.  3. Severe calcific aortic stenosis by recent followup echocardiogram, see above report.  This has been discussed with the patient. Will need to have close office followup to determine the next step. She is a former patient of [name].  4. Chronic bronchitis with exacerbation. On steroids and antibiotics.   Plan/Discussion:    Clinically stable for discharge from a cardiac perspective. We will arrange an office visit to discuss further evaluation and management of her aortic stenosis which has progressed since last assessment 2013. Office will call back with date and time.   [name], M.D., F.A.C.C.

## 2013-09-25 NOTE — Progress Notes (Signed)
D/c instructions reviewed with patient.  Verbalized understanding.  Pt dc'd to home with family. Schonewitz, Candelaria StagersLeigh Anne 09/25/2013

## 2013-09-25 NOTE — Discharge Summary (Signed)
Physician Discharge Summary  Sandra Turner AVW:[id number] DOB: [birth year] DOA: 09/22/2013  PCP: [name], MD  Admit date: 09/22/2013 Discharge date: 09/25/2013  Recommendations for Outpatient Follow-up:  1. Followup diastolic congestive heart failure. Diuretic regimen changed as below. 2. Resolution of bronchitis. Already has albuterol inhaler at home. 3. Severe aortic stenosis. She will follow with cardiology in the near future for further recommendations. 4. Morbid obesity with BMI 47.84   Follow-up Information   Follow up with [facility] [place] On 10/27/2013. (at 11:20 am)    Specialty:  Cardiology   Contact information:   [address] [place] [address] [phone]      Follow up with [name], MD. Schedule an appointment as soon as possible for a visit in 1 week.   Specialty:  Family Medicine   Contact information:   [facility] [address] [place] [address] [phone]      Discharge Diagnoses:  1. Acute on chronic diastolic congestive heart failure 2. Severe aortic stenosis 3. Atrial fibrillation 4. Subacute bronchitis 5. Morbid obesity with BMI 47.84  Discharge Condition: Improved Disposition: Home  Diet recommendation: Heart healthy  Filed Weights   09/22/13 1624 09/22/13 2300 09/24/13 0955  Weight: 122.471 kg (270 lb) 119.976 kg (264 lb 8 oz) 123.378 kg (272 lb)    History of present illness:  73 year old woman present with increasing weight gain shortness of breath. Admitted for acute heart failure.  Hospital Course:  Sandra Turner was treated for acute diastolic heart failure with IV diuresis with rapid improvement in her symptoms. Decompensation possibly related to NSAID use. She was seen by cardiology and diuretics adjusted. She will followup as an outpatient for severe aortic stenosis. She also had a subacute bronchitis which was treated as an outpatient. She responded well to nebulizer therapy and low-dose  prednisone here. Individual issues as below.   1. Acute on chronic diastolic congestive heart failure. Appears compensated. Cardiology consultation appreciated. Took Lasix only as needed at home, possible complicated by NSAIDs. Weight up 10 pounds on admission from last visit. Cardiology recommended changing PRN Lasix to daily Lasix 20mg . 2. Atrial fibrillation. Stable without rate control agents. Continue Pradaxa. 3. Recent bronchitis. Treated with steroids as an outpatient. Some wheezes. Responding well to nebulizers and low-dose steroids. 4. Severe aortic stenosis. Followup with cardiology as an outpatient. 5. Morbid Obesity W/ BMI 47.84  Consultants:  Cardiology Procedures:  2-D echocardiogram left ventricular ejection fraction 60-65%. Normal wall motion. Grade 2 diastolic dysfunction. Severe left atrial enlargement. Serial calcific aortic stenosis.  Discharge Instructions  Discharge Orders   Future Appointments Provider Department Dept Phone   10/27/2013 11:20 AM [name], MD [facility] [phone]   Future Orders Complete By Expires   (HEART FAILURE PATIENTS) Call MD:  Anytime you have any of the following symptoms: 1) 3 pound weight gain in 24 hours or 5 pounds in 1 week 2) shortness of breath, with or without a dry hacking cough 3) swelling in the hands, feet or stomach 4) if you have to sleep on extra pillows at night in order to breathe.  As directed    Activity as tolerated - No restrictions  As directed    Diet - low sodium heart healthy  As directed    Discharge instructions  As directed    Comments:     Call physician or seek immediate medical attention for shortness of breath, increased swelling or worsening of condition. Use albuterol inhaler  as directed primary care physician.       Medication List    STOP taking these medications       BC HEADACHE POWDER PO      TAKE these medications       ALPHA LIPOIC ACID PO  Take 1 capsule by mouth  daily.     CALCIUM PO  Take 2 tablets by mouth daily.     dabigatran 150 MG Caps capsule  Commonly known as:  PRADAXA  TAKE ONE CAPSULE BY MOUTH EVERY 12 HOURS     folic acid 1 MG tablet  Commonly known as:  FOLVITE  Take 1 mg by mouth daily.     furosemide 20 MG tablet  Commonly known as:  LASIX  Take 1 tablet (20 mg total) by mouth daily.     GINKGO BILOBA PO  Take 2 tablets by mouth daily.     GREEN TEA PO  Take 1 tablet by mouth daily.     MAGNESIUM PO  Take 2 tablets by mouth daily.     MSM PO  Take 3 tablets by mouth daily.     POTASSIUM PO  Take 1 tablet by mouth daily.     predniSONE 20 MG tablet  Commonly known as:  DELTASONE  Take 1 tablet (20 mg total) by mouth daily with breakfast.     VITAMIN A PO  Take 1 tablet by mouth daily.     VITAMIN C PO  Take 2 tablets by mouth daily.     VITAMIN E PO  Take 1 tablet by mouth daily.     ZINC PO  Take 1 tablet by mouth daily.       Allergies  Allergen Reactions  . Penicillins Other (See Comments)    Unknown  . Latex Rash    The results of significant diagnostics from this hospitalization (including imaging, microbiology, ancillary and laboratory) are listed below for reference.    Significant Diagnostic Studies: Dg Chest Portable 1 View  09/22/2013   CLINICAL DATA:  Worsening shortness of breath and congestion.  EXAM: PORTABLE CHEST - 1 VIEW  COMPARISON:  Chest radiograph performed earlier today at 1:25 p.m.  FINDINGS: The lungs are well-aerated. Vascular congestion is noted. Chronically increased interstitial markings are seen. There is suggestion of superimposed airspace opacity, which may reflect mild interstitial edema. No definite pleural effusion or pneumothorax is seen.  The cardiomediastinal silhouette is mildly enlarged. No acute osseous abnormalities are seen.  IMPRESSION: Mildly worsened increased interstitial markings, with underlying vascular congestion and mild cardiomegaly. This may  reflect mild pulmonary edema, superimposed on patient's chronic interstitial changes.   Electronically Signed   By: Roanna Raider M.D.   On: 09/22/2013 21:21   Labs: Basic Metabolic Panel:  Recent Labs Lab 09/22/13 1739 09/23/13 0544 09/24/13 0541 09/25/13 0551  NA 143 143 143 142  K 4.3 4.3 3.8 4.1  CL 105 103 103 104  CO2 29 29 29 28   GLUCOSE 96 159* 107* 109*  BUN 18 18 26* 19  CREATININE 0.79 0.85 0.84 0.74  CALCIUM 9.3 9.1 8.8 9.2   Liver Function Tests:  Recent Labs Lab 09/22/13 1739 09/23/13 0544  AST 24 24  ALT 18 16  ALKPHOS 107 105  BILITOT 0.4 0.4  PROT 6.6 6.7  ALBUMIN 3.6 3.5   CBC:  Recent Labs Lab 09/22/13 1739 09/23/13 0544  WBC 6.2 3.9*  NEUTROABS 4.5  --   HGB 10.0* 10.0*  HCT 31.5* 32.3*  MCV 90.3 91.0  PLT 202 206   Cardiac Enzymes:  Recent Labs Lab 09/22/13 1739  TROPONINI <0.30    Recent Labs  09/22/13 1739  PROBNP 1604.0*    Principal Problem:   Acute on chronic diastolic heart failure Active Problems:   Atrial fibrillation   Pulmonary hypertension   Time coordinating discharge: 35 minutes  Signed:  [name], MD [facility] 09/25/2013, 12:25 PM

## 2013-09-25 NOTE — Progress Notes (Signed)
[facility] PROGRESS NOTE  Sandra Turner ZOX:[id number] DOB: [birth year] DOA: 09/22/2013 PCP: Ernestine ConradBLUTH, [name], MD  Summary: 73 year old woman present with increasing weight gain shortness of breath. Admitted for acute heart failure.  Assessment/Plan: 1. Acute on chronic diastolic congestive heart failure. Appears compensated. Cardiology consultation appreciated. Took Lasix only as needed at home, possible complicated by NSAIDs. Weight up 10 pounds on admission from last visit.  2. Atrial fibrillation. Stable without rate control agents. Continue Pradaxa. 3. Recent bronchitis. Treated with steroids as an outpatient. Some wheezes. Responding well to nebulizers and low-dose steroids. 4. Severe aortic stenosis. Followup with cardiology as an outpatient. 5. Morbid Obesity W/ BMI 47.84   Discharge home today.  Daily Lasix.  Followup severe calcific aortic stenosis with cardiology as an outpatient  Subacute bronchitis, responding well to low-dose steroids. Continue steroids number next few days.  Code Status: full code DVT prophylaxis: Pradaxa Family Communication: None present Disposition Plan: home when improved  Brendia Sacksaniel Delcie Ruppert, MD  [facility]  Pager [phone] If 7PM-7AM, please contact night-coverage at [email or web address], password [email or web address] 09/25/2013, 12:07 PM  LOS: 3 days   Consultants:  Cardiology  Procedures:  2-D echocardiogram left ventricular ejection fraction 60-65%. Normal wall motion. Grade 2 diastolic dysfunction. Severe left atrial enlargement. Serial calcific aortic stenosis.  Antibiotics:    HPI/Subjective: Continues to feel better. Breathing better. Less wheezing. Less edema. Less cough. Ready to go home.  Objective: Filed Vitals:   09/24/13 2042 09/25/13 0214 09/25/13 0430 09/25/13 0701  BP:   136/56   Pulse:   84   Temp:   98 F (36.7 C)   TempSrc:   Oral   Resp:   16   Height:      Weight:      SpO2: 93% 93% 96% 97%    Intake/Output Summary  (Last 24 hours) at 09/25/13 1207 Last data filed at 09/25/13 0842  Gross per 24 hour  Intake    960 ml  Output      0 ml  Net    960 ml     Filed Weights   09/22/13 1624 09/22/13 2300 09/24/13 0955  Weight: 122.471 kg (270 lb) 119.976 kg (264 lb 8 oz) 123.378 kg (272 lb)    Exam:   Afebrile, vital signs stable. No hypoxia.  General: appears calm and comfortable, speech fluent and clear, sitting in chair eating lunch  Cardiovascular: Regular rate and rhythm. 3/6 systolic murmur. No rub or gallop. 1-2+ bilateral lower extremity edema, improved.  Respiratory: Improved air movement, much less wheezing. Normal respiratory effort. No rhonchi or rales noted.  Psychiatric: Grossly normal mood and affect.  Data Reviewed:  Urine output incompletely recorded  Basic metabolic panel unremarkable  Scheduled Meds: . dabigatran  150 mg Oral Q12H  . furosemide  20 mg Oral Daily  . ipratropium  0.5 mg Nebulization Q6H  . levalbuterol  0.63 mg Nebulization Q6H  . predniSONE  20 mg Oral Q breakfast  . sodium chloride  3 mL Intravenous Q12H   Continuous Infusions:   Principal Problem:   Acute on chronic diastolic heart failure Active Problems:   Atrial fibrillation   Pulmonary hypertension

## 2013-10-26 ENCOUNTER — Encounter: Payer: Self-pay | Admitting: Cardiology

## 2013-10-26 ENCOUNTER — Ambulatory Visit (INDEPENDENT_AMBULATORY_CARE_PROVIDER_SITE_OTHER): Payer: Medicare Other | Admitting: Cardiology

## 2013-10-26 VITALS — BP 134/57 | HR 80 | Ht 62.0 in | Wt 248.0 lb

## 2013-10-26 DIAGNOSIS — J449 Chronic obstructive pulmonary disease, unspecified: Secondary | ICD-10-CM | POA: Insufficient documentation

## 2013-10-26 DIAGNOSIS — I359 Nonrheumatic aortic valve disorder, unspecified: Secondary | ICD-10-CM

## 2013-10-26 DIAGNOSIS — I4891 Unspecified atrial fibrillation: Secondary | ICD-10-CM

## 2013-10-26 DIAGNOSIS — I350 Nonrheumatic aortic (valve) stenosis: Secondary | ICD-10-CM

## 2013-10-26 NOTE — Assessment & Plan Note (Signed)
Reports chronic problems with bronchitis. Followup PFTs to be arranged.

## 2013-10-26 NOTE — Assessment & Plan Note (Signed)
She continues on Pradaxa. This was started by Dr. Dietrich Patesothbart, she has tolerated it without significant bleeding issues.

## 2013-10-26 NOTE — Assessment & Plan Note (Signed)
Recently documented in severe range, previously moderate in 2013. Mean gradient 48 mmHg. LVEF is normal at 60-65% with grade 2 diastolic dysfunction and increased filling pressures. We have discussed the natural history of aortic stenosis, typical symptoms, and also indications for surgical management. For now, she was most comfortable with close observation. Will arrange followup no later than 6 months with an echocardiogram. I have asked her in the meanwhile to be particularly observant for progressive exertional symptoms or functional limitations over baseline. Will also get formal PFTs to assess degree of COPD. I have asked her to plan to go ahead and have formal testing for her susceptibility to malignant hyperthermia. There is a website for the Malignant Hyperthermia Association in the [place] that has information regarding testing sites. This will be important to understand as it relates to options for addressing her aortic stenosis.

## 2013-10-26 NOTE — Progress Notes (Signed)
Clinical Summary Ms. Ante is a 73 y.o.female last seen by Ms. Lawrence NP in September 2014, a former patient of [name]. She was seen recently as an inpatient consult at [facility] in January with acute on chronic diastolic heart failure. Followup echocardiogram at that time demonstrated LVEF 60-65% with grade 2 diastolic dysfunction and increased filling pressures. She has severe calcific aortic stenosis which had progressed since 2013, mean gradient 48 mm mercury and peak gradient 75 mm mercury. PASP was 59 mm mercury.  Cardiac history is outlined below. Recent lab work showed potassium 4.1, BUN 19, creatinine 0.7.  She tells me she feels much better following hospital discharge. Breathing is at baseline, typically NYHA class II, no exertional angina, no palpitations or dizziness. She has not had recent pulmonary function testing.  We discussed the natural progression of aortic stenosis, typical indications for surgical intervention. At this point she was most comfortable with observation. I have recommended in that case that we have her come back no later than 6 months with a followup echocardiogram, and in the meanwhile we need to get PFTs. Certainly if any symptoms intervene we will need to proceed more quickly.  She reports a family history of malignant hyperthermia, 2 deaths in surgery. States that she has cousins with this condition, some of which have had testing for screening purposes. She has not been tested to see if she is susceptible. This will be particularly important to know as it may affect approach to management of her aortic valve.   Allergies  Allergen Reactions  . Penicillins Other (See Comments)    Unknown  . Latex Rash    Current Outpatient Prescriptions  Medication Sig Dispense Refill  . ALPHA LIPOIC ACID PO Take 1 capsule by mouth daily.      . Ascorbic Acid (VITAMIN C PO) Take 2 tablets by mouth daily.      Marland Kitchen CALCIUM PO Take 2 tablets by mouth daily.       . dabigatran (PRADAXA) 150 MG CAPS capsule TAKE ONE CAPSULE BY MOUTH EVERY 12 HOURS  30 capsule  6  . folic acid (FOLVITE) 1 MG tablet Take 1 mg by mouth daily.      . furosemide (LASIX) 20 MG tablet Take 1 tablet (20 mg total) by mouth daily.  30 tablet  0  . GINKGO BILOBA PO Take 2 tablets by mouth daily.      Chilton Si Tea, Camillia sinensis, (GREEN TEA PO) Take 1 tablet by mouth daily.      Marland Kitchen MAGNESIUM PO Take 2 tablets by mouth daily.      . Methylsulfonylmethane (MSM PO) Take 3 tablets by mouth daily.      . Multiple Vitamins-Minerals (ZINC PO) Take 1 tablet by mouth daily.      Marland Kitchen POTASSIUM PO Take 1 tablet by mouth daily.      Marland Kitchen VITAMIN A PO Take 1 tablet by mouth daily.      Marland Kitchen VITAMIN E PO Take 1 tablet by mouth daily.       No current facility-administered medications for this visit.    Past Medical History  Diagnosis Date  . Arthritis   . History of pneumonia   . COPD (chronic obstructive pulmonary disease)   . Chronic atrial fibrillation   . Aortic stenosis     Moderate 2013    Social History Ms. Bungert reports that she has never smoked. She does not have any smokeless tobacco history on file.  [name] reports that she does not drink alcohol.  Review of Systems Negative except as outlined above.  Physical Examination Filed Vitals:   10/26/13 1523  BP: 134/57  Pulse: 80   Filed Weights   10/26/13 1523  Weight: 248 lb (112.492 kg)   Obese woman, appears comfortable at rest. HEENT: Conjunctiva and lids normal, oropharynx clear. Neck: Supple, no elevated JVP or carotid bruits, no thyromegaly. Lungs: Decreased breath sounds without wheezing, nonlabored breathing at rest. Cardiac: Regular rate and rhythm, no S3 3/6 systolic murmur at base consistent with AS, no pericardial rub. Abdomen: Soft, nontender, protuberant, bowel sounds present, no guarding or rebound. Extremities: No pitting edema, distal pulses 2+. Skin: Warm and dry. Musculoskeletal: No  kyphosis. Neuropsychiatric: Alert and oriented x3, affect grossly appropriate.   Problem List and Plan   Aortic stenosis Recently documented in severe range, previously moderate in 2013. Mean gradient 48 mmHg. LVEF is normal at 60-65% with grade 2 diastolic dysfunction and increased filling pressures. We have discussed the natural history of aortic stenosis, typical symptoms, and also indications for surgical management. For now, she was most comfortable with close observation. Will arrange followup no later than 6 months with an echocardiogram. I have asked her in the meanwhile to be particularly observant for progressive exertional symptoms or functional limitations over baseline. Will also get formal PFTs to assess degree of COPD. I have asked her to plan to go ahead and have formal testing for her susceptibility to malignant hyperthermia. There is a website for the Malignant Hyperthermia Association in the [place] that has information regarding testing sites. This will be important to understand as it relates to options for addressing her aortic stenosis.  Atrial fibrillation She continues on Pradaxa. This was started by Dr. Dietrich Patesothbart, she has tolerated it without significant bleeding issues.  COPD (chronic obstructive pulmonary disease) Reports chronic problems with bronchitis. Followup PFTs to be arranged.    Jonelle SidleSamuel G. Narissa Beaufort, M.D., F.A.C.C.

## 2013-10-26 NOTE — Patient Instructions (Addendum)
Your physician wants you to follow-up in: 6 months You will receive a reminder letter in the mail two months in advance. If you don't receive a letter, please call our office to schedule the follow-up appointment.   Your physician recommends that you continue on your current medications as directed. Please refer to the Current Medication list given to you today.   Your physician has recommended that you have a pulmonary function test. Pulmonary Function Tests are a group of tests that measure how well air moves in and out of your lungs.   Your physician has requested that you have an echocardiogram. Echocardiography is a painless test that uses sound waves to create images of your heart. It provides your doctor with information about the size and shape of your heart and how well your heart's chambers and valves are working. This procedure takes approximately one hour. There are no restrictions for this procedure. PLEASE schedule just before next visit with Dr.McDowell   Thank you for choosing [facility] !

## 2013-10-27 ENCOUNTER — Encounter: Payer: Medicare Other | Admitting: Cardiology

## 2013-11-09 ENCOUNTER — Ambulatory Visit (HOSPITAL_COMMUNITY)
Admission: RE | Admit: 2013-11-09 | Discharge: 2013-11-09 | Disposition: A | Discharge status: Home / Self Care | Payer: Medicare Other | Source: Ambulatory Visit | Attending: Cardiology | Admitting: Cardiology

## 2013-11-09 DIAGNOSIS — J4489 Other specified chronic obstructive pulmonary disease: Secondary | ICD-10-CM | POA: Insufficient documentation

## 2013-11-09 DIAGNOSIS — J449 Chronic obstructive pulmonary disease, unspecified: Secondary | ICD-10-CM | POA: Insufficient documentation

## 2013-11-09 LAB — PULMONARY FUNCTION TEST
DL/VA % pred: 87 %
DL/VA: 3.99 ml/min/mmHg/L
DLCO UNC % PRED: 55 %
DLCO cor % pred: 58 %
DLCO cor: 12.62 ml/min/mmHg
DLCO unc: 12.04 ml/min/mmHg
FEF 25-75 Post: 0.84 L/sec
FEF 25-75 Pre: 0.64 L/sec
FEF2575-%Change-Post: 30 %
FEF2575-%Pred-Post: 48 %
FEF2575-%Pred-Pre: 37 %
FEV1-%Change-Post: 9 %
FEV1-%PRED-PRE: 51 %
FEV1-%Pred-Post: 55 %
FEV1-POST: 1.13 L
FEV1-PRE: 1.04 L
FEV1FVC-%Change-Post: 0 %
FEV1FVC-%Pred-Pre: 89 %
FEV6-%CHANGE-POST: 10 %
FEV6-%PRED-PRE: 59 %
FEV6-%Pred-Post: 65 %
FEV6-Post: 1.69 L
FEV6-Pre: 1.53 L
FEV6FVC-%PRED-POST: 105 %
FEV6FVC-%Pred-Pre: 105 %
FVC-%CHANGE-POST: 10 %
FVC-%PRED-POST: 62 %
FVC-%Pred-Pre: 56 %
FVC-Post: 1.69 L
FVC-Pre: 1.53 L
POST FEV1/FVC RATIO: 67 %
Post FEV6/FVC ratio: 100 %
Pre FEV1/FVC ratio: 68 %
Pre FEV6/FVC Ratio: 100 %
RV % PRED: 112 %
RV: 2.39 L
TLC % pred: 84 %
TLC: 4 L

## 2013-11-09 LAB — BLOOD GAS, ARTERIAL
ACID-BASE EXCESS: 4.5 mmol/L — AB (ref 0.0–2.0)
Bicarbonate: 28.4 mEq/L — ABNORMAL HIGH (ref 20.0–24.0)
FIO2: 0.21 %
O2 SAT: 91.2 %
PCO2 ART: 41.3 mmHg (ref 35.0–45.0)
Patient temperature: 37
TCO2: 25.4 mmol/L (ref 0–100)
pH, Arterial: 7.451 — ABNORMAL HIGH (ref 7.350–7.450)
pO2, Arterial: 61.2 mmHg — ABNORMAL LOW (ref 80.0–100.0)

## 2013-11-09 MED ORDER — ALBUTEROL SULFATE (2.5 MG/3ML) 0.083% IN NEBU
2.5000 mg | INHALATION_SOLUTION | Freq: Once | RESPIRATORY_TRACT | Status: AC
  Administered 2013-11-09: 2.5 mg via RESPIRATORY_TRACT

## 2013-11-18 NOTE — Procedures (Signed)
NAME:  Sandra Turner, Sandra Turner                ACCOUNT NO.:  [id number]  MEDICAL RECORD NO.:  [id number]  LOCATION:                                 FACILITY:  PHYSICIAN:  Laelle Bridgett L. Juanetta GoslingHawkins, M.D.DATE OF BIRTH:  06/11/1941  DATE OF PROCEDURE:  11/17/2013 DATE OF DISCHARGE:                           PULMONARY FUNCTION TEST   REASON FOR PULMONARY FUNCTION TESTING:  COPD. 1. Spirometry shows a moderate ventilatory defect with evidence of     airflow obstruction. 2. Lung volumes are normal. 3. Airway resistance is high confirming the presence of airflow     obstruction. 4. DLCO is moderately reduced. 5. Arterial blood gas shows relative resting hypoxia with normal pH. 6. This study is consistent with clinical diagnosis of COPD.     [name] L. Juanetta GoslingHawkins, M.D.     ELH/MEDQ  D:  11/17/2013  T:  11/18/2013  Job:  [id number]

## 2013-11-19 ENCOUNTER — Telehealth: Payer: Self-pay | Admitting: *Deleted

## 2013-11-19 MED ORDER — DABIGATRAN ETEXILATE MESYLATE 150 MG PO CAPS: ORAL_CAPSULE | ORAL | Status: DC

## 2013-11-19 NOTE — Telephone Encounter (Signed)
Medication sent via escribe.  

## 2013-11-19 NOTE — Telephone Encounter (Signed)
[facility] [phone] f [phone]  pradaxa 150 mg #30

## 2014-02-18 ENCOUNTER — Telehealth: Payer: Self-pay | Admitting: Cardiology

## 2014-02-18 MED ORDER — DABIGATRAN ETEXILATE MESYLATE 150 MG PO CAPS: ORAL_CAPSULE | ORAL | Status: DC

## 2014-02-18 NOTE — Telephone Encounter (Signed)
Refill request complete 

## 2014-02-18 NOTE — Telephone Encounter (Signed)
Received fax refill request  Rx # [id number] Medication:  Pradaxa 150 mg cap Qty 30 Sig:  Take one capsule by mouth every 12 hours Physician:  Diona BrownerMcDowell

## 2014-04-19 ENCOUNTER — Ambulatory Visit (HOSPITAL_COMMUNITY)
Admission: RE | Admit: 2014-04-19 | Discharge: 2014-04-19 | Disposition: A | Discharge status: Home / Self Care | Payer: Medicare Other | Source: Ambulatory Visit | Attending: Cardiology | Admitting: Cardiology

## 2014-04-19 DIAGNOSIS — I359 Nonrheumatic aortic valve disorder, unspecified: Secondary | ICD-10-CM

## 2014-04-19 DIAGNOSIS — I080 Rheumatic disorders of both mitral and aortic valves: Secondary | ICD-10-CM | POA: Insufficient documentation

## 2014-04-19 DIAGNOSIS — I350 Nonrheumatic aortic (valve) stenosis: Secondary | ICD-10-CM

## 2014-04-19 DIAGNOSIS — I2789 Other specified pulmonary heart diseases: Secondary | ICD-10-CM | POA: Insufficient documentation

## 2014-04-19 NOTE — Progress Notes (Signed)
  Echocardiogram 2D Echocardiogram has been performed.  Cristy Colmenares 04/19/2014, 2:30 PM

## 2014-04-20 ENCOUNTER — Telehealth: Payer: Self-pay

## 2014-04-20 NOTE — Telephone Encounter (Signed)
Message copied by Nori RiisARLTON, Edilson Vital A on Tue Apr 20, 2014 12:14 PM ------      Message from: MCDOWELL, Illene BolusSAMUEL G      Created: Mon Apr 19, 2014  3:38 PM       Reviewed. Severe aortic stenosis with increased valve gradients compared to last study. She has preferred conservative approach so far, but we need to discuss further in clinic regarding next step and possibility of correction (either surgical or catheter based). ------

## 2014-04-20 NOTE — Telephone Encounter (Signed)
lmtcb

## 2014-04-21 ENCOUNTER — Telehealth: Payer: Self-pay | Admitting: *Deleted

## 2014-04-21 NOTE — Telephone Encounter (Signed)
Notified pt of results she seemed to understand and said she would discuss surgery or cath. [facility] to [employer]

## 2014-04-21 NOTE — Telephone Encounter (Signed)
Message copied by Vernon PreyBARKER, STACI T on Wed Apr 21, 2014  4:00 PM ------      Message from: MCDOWELL, Illene BolusSAMUEL G      Created: Mon Apr 19, 2014  3:38 PM       Reviewed. Severe aortic stenosis with increased valve gradients compared to last study. She has preferred conservative approach so far, but we need to discuss further in clinic regarding next step and possibility of correction (either surgical or catheter based). ------

## 2014-04-26 ENCOUNTER — Encounter: Payer: Self-pay | Admitting: Cardiology

## 2014-04-26 ENCOUNTER — Ambulatory Visit (INDEPENDENT_AMBULATORY_CARE_PROVIDER_SITE_OTHER): Payer: Medicare Other | Admitting: Cardiology

## 2014-04-26 VITALS — BP 138/78 | HR 88 | Ht 63.0 in | Wt 236.0 lb

## 2014-04-26 DIAGNOSIS — Z8489 Family history of other specified conditions: Secondary | ICD-10-CM | POA: Insufficient documentation

## 2014-04-26 DIAGNOSIS — I350 Nonrheumatic aortic (valve) stenosis: Secondary | ICD-10-CM

## 2014-04-26 DIAGNOSIS — I482 Chronic atrial fibrillation, unspecified: Secondary | ICD-10-CM

## 2014-04-26 DIAGNOSIS — I359 Nonrheumatic aortic valve disorder, unspecified: Secondary | ICD-10-CM

## 2014-04-26 DIAGNOSIS — I4891 Unspecified atrial fibrillation: Secondary | ICD-10-CM

## 2014-04-26 NOTE — Assessment & Plan Note (Signed)
She continues on Pradaxa without reported bleeding problems. Last hemoglobin 10.0 with normal MCV, normal BUN and creatinine.

## 2014-04-26 NOTE — Assessment & Plan Note (Signed)
Severe calcific aortic stenosis as outlined above, progressed over time. Patient has associated lung disease with moderate ventilatory defect and airway obstruction, severe pulmonary hypertension, atrial fibrillation, and also reported family history of malignant hyperthermia resulting in 2 deaths with surgery. She may well be a better candidate to consider TAVR rather than an open procedure, if she is a candidate. I am scheduling an evaluation by Dr. Excell Seltzer for further discussion, she has been hesitant to pursue any further evaluation up to this point. She will need to have a heart catheterization after that as well if she agrees.

## 2014-04-26 NOTE — Patient Instructions (Addendum)
Your physician recommends that you schedule a follow-up appointment in: 3 months    You have been referred to Dr.Cooper at the [facility] office. His nurse, Julieta Gutting, will contact you for an appointment      Your physician recommends that you continue on your current medications as directed. Please refer to the Current Medication list given to you today.      Thank you for choosing [facility] !

## 2014-04-26 NOTE — Progress Notes (Signed)
Clinical Summary Ms. Pata is a 73 y.o.female last seen in February of this year. She has a history of progressive aortic stenosis, previously followed by [name]. She has generally preferred a conservative approach since I started following her earlier this year.  Most recent echocardiogram done this August showed mild LVH with LVEF 60-65%, probable grade 2 diastolic dysfunction, severe left atrial enlargement, MAC with mild to moderate mitral regurgitation, severe calcific aortic stenosis (mean aortic gradient 56 mm mercury) with progression compared to prior study in January, severe pulmonary hypertension with PASP 76 mm mercury. I discussed the results with her today. She reports NYHA class 2-3 shortness of breath.  She reports a family history of malignant hyperthermia, 2 deaths in surgery. States that she has cousins with this condition, some of which have had testing for screening purposes. She has not been tested to see if she is susceptible. I have recommended to her that she have this tested, mentioned that there is a website for the Malignant Hyperthermia [employer] in the [place] that has information regarding testing sites (I think it may involve a muscle biopsy). She has not pursued this any further.  PFTs done in March of this year describe a "moderate ventilatory defect" with evidence of airflow obstruction, normal lung volumes, moderately reduced DLCO (Dr. Juanetta Gosling). The report is in [facility].  I have recommended again to Ms. Deshazer that we consider further evaluation to determine options for management of her aortic stenosis. She is in agreement to seek additional consultation, and I have planned to have her evaluated by Dr. Excell Seltzer as a next step. She certainly will need to have a heart catheterization done, although it may be that she is a better candidate for TAVR than surgery in light of the complexities discussed and her lung disease with severe pulmonary  hypertension.   Allergies  Allergen Reactions  . Penicillins Other (See Comments)    Unknown  . Latex Rash    Current Outpatient Prescriptions  Medication Sig Dispense Refill  . ALPHA LIPOIC ACID PO Take 1 capsule by mouth daily.      . Ascorbic Acid (VITAMIN C PO) Take 2 tablets by mouth daily.      . B Complex Vitamins (B COMPLEX PO) Take by mouth.      . dabigatran (PRADAXA) 150 MG CAPS capsule TAKE ONE CAPSULE BY MOUTH EVERY 12 HOURS  60 capsule  6  . folic acid (FOLVITE) 1 MG tablet Take 1 mg by mouth daily.      . furosemide (LASIX) 20 MG tablet Take 1 tablet (20 mg total) by mouth daily.  30 tablet  0  . GINKGO BILOBA PO Take 2 tablets by mouth daily.      . Ginseng (GIN-ZING PO) Take by mouth. bid      . Green Tea, Camillia sinensis, (GREEN TEA PO) Take 1 tablet by mouth daily.      Marland Kitchen MAGNESIUM PO Take 2 tablets by mouth daily.      . Methylsulfonylmethane (MSM PO) Take 3 tablets by mouth daily.      Marland Kitchen POTASSIUM PO Take 1 tablet by mouth daily.      Marland Kitchen VITAMIN A PO Take 1 tablet by mouth daily.      Marland Kitchen VITAMIN E PO Take 1 tablet by mouth daily.       No current facility-administered medications for this visit.    Past Medical History  Diagnosis Date  . Arthritis   .  History of pneumonia   . COPD (chronic obstructive pulmonary disease)   . Chronic atrial fibrillation   . Aortic stenosis     Moderate 2013    Past Surgical History  Procedure Laterality Date  . None      Family History  Problem Relation Age of Onset  . Breast cancer Mother   . Congestive Heart Failure Mother   . Cancer Father   . Malignant hyperthermia Cousin     Social History Ms. Aina reports that she has never smoked. She does not have any smokeless tobacco history on file. Ms. Huffstetler reports that she does not drink alcohol.  Review of Systems No palpitations or syncope. No cough or hemoptysis. Other systems reviewed and negative except as outlined.  Physical Examination Filed  Vitals:   04/26/14 1352  BP: 138/78  Pulse: 88   Filed Weights   04/26/14 1352  Weight: 236 lb (107.049 kg)    Obese woman, appears comfortable at rest.  HEENT: Conjunctiva and lids normal, oropharynx clear.  Neck: Supple, no elevated JVP or carotid bruits, no thyromegaly.  Lungs: Decreased breath sounds without wheezing, nonlabored breathing at rest.  Cardiac: Regular rate and rhythm, no S3 3/6 systolic murmur at base consistent with AS, no pericardial rub.  Abdomen: Soft, nontender, protuberant, bowel sounds present, no guarding or rebound.  Extremities: No pitting edema, distal pulses 2+.  Skin: Warm and dry.  Musculoskeletal: No kyphosis.  Neuropsychiatric: Alert and oriented x3, affect grossly appropriate.   Problem List and Plan   Aortic stenosis Severe calcific aortic stenosis as outlined above, progressed over time. Patient has associated lung disease with moderate ventilatory defect and airway obstruction, severe pulmonary hypertension, atrial fibrillation, and also reported family history of malignant hyperthermia resulting in 2 deaths with surgery. She may well be a better candidate to consider TAVR rather than an open procedure, if she is a candidate. I am scheduling an evaluation by Dr. Excell Seltzer for further discussion, she has been hesitant to pursue any further evaluation up to this point. She will need to have a heart catheterization after that as well if she agrees.  Family history of malignant hyperthermia I continue to recommend that she get screened and have provided her the information to make contact with a center that performs this testing. Frankly, I am not certain whether our anesthesia group can help with this or not. Clearly, this will need to be investigated before she is considered for any type of open aortic valve repair.  Atrial fibrillation She continues on Pradaxa without reported bleeding problems. Last hemoglobin 10.0 with normal MCV, normal BUN and  creatinine.    [name], M.D., F.A.C.C.

## 2014-04-26 NOTE — Assessment & Plan Note (Signed)
I continue to recommend that she get screened and have provided her the information to make contact with a center that performs this testing. Frankly, I am not certain whether our anesthesia group can help with this or not. Clearly, this will need to be investigated before she is considered for any type of open aortic valve repair.

## 2014-04-27 ENCOUNTER — Telehealth: Payer: Self-pay | Admitting: *Deleted

## 2014-04-27 NOTE — Telephone Encounter (Signed)
Pt has been referred to Dr. [name] for TAVR consult. Dr. [name] can see pt on September 9th or 10th at 2 PM.  I placed call to pt to schedule appt. Left message to call back.

## 2014-04-29 NOTE — Telephone Encounter (Signed)
Appt is May 12, 2014 at 2:00

## 2014-05-12 ENCOUNTER — Ambulatory Visit (INDEPENDENT_AMBULATORY_CARE_PROVIDER_SITE_OTHER): Payer: Medicare Other | Admitting: Cardiovascular Disease

## 2014-05-12 ENCOUNTER — Encounter: Payer: Self-pay | Admitting: Cardiovascular Disease

## 2014-05-12 VITALS — BP 130/66 | HR 72 | Ht 62.0 in | Wt 249.0 lb

## 2014-05-12 DIAGNOSIS — I272 Pulmonary hypertension, unspecified: Secondary | ICD-10-CM

## 2014-05-12 DIAGNOSIS — J42 Unspecified chronic bronchitis: Secondary | ICD-10-CM

## 2014-05-12 DIAGNOSIS — I359 Nonrheumatic aortic valve disorder, unspecified: Secondary | ICD-10-CM

## 2014-05-12 DIAGNOSIS — I4891 Unspecified atrial fibrillation: Secondary | ICD-10-CM

## 2014-05-12 DIAGNOSIS — I482 Chronic atrial fibrillation, unspecified: Secondary | ICD-10-CM

## 2014-05-12 DIAGNOSIS — I2789 Other specified pulmonary heart diseases: Secondary | ICD-10-CM

## 2014-05-12 DIAGNOSIS — I350 Nonrheumatic aortic (valve) stenosis: Secondary | ICD-10-CM

## 2014-05-12 NOTE — Progress Notes (Signed)
[facility]   History of Present Illness: 73 yo female with history of aortic stenosis, chronic atrial fibrillation on Pradaxa, COPD/bronchitis, pulmonary HTN here today as a new patient for evaluation of aortic valve stenosis and consideration for possible transcatheter aortic valve replacement. She has been known to have moderate aortic stenosis that has been followed for the last several years in our [place] cardiology office. She was most recently seen by [name] in the [place] office and described worsening dyspnea with exertion. Of note, there is a family history of malignant hyperthermia with deaths of 2 family members during surgery. Echo 04/19/14 with normal LV size and systolic function, LVEF=60-65%. There is severe aortic valve stenosis with mean gradient of 56 mm Hg, peak gradient 96 mm Hg. AVA 0.71cm2. Mild to moderate MR. PA pressure estimated at 76 mm Hg.   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 She works full days and is not limited by any chest pain or SOB. She notes dyspnea when her "bronchitis is acting up". She has no exertional dyspnea at this time although she admits that she does have dyspnea when there is pollen in the air. No exertional chest pain. She states that she has been battling bronchitis/asthma for years. She has never been a smoker. She has chronic lower extemity edema and uses Lasix daily. She is limited in strenuous activities by arthritis and pain in her knees. She has had no syncopal events.   Primary Care Physician: [name] Referring Physician: Dr. Nona Dell   Past Medical History  Diagnosis Date  . Arthritis   . History of pneumonia   . COPD (chronic obstructive pulmonary disease)    . Chronic atrial fibrillation   . Aortic stenosis     Severe by echo August 2015    Past Surgical History  Procedure Laterality Date  . None      Current Outpatient Prescriptions  Medication Sig Dispense Refill  . ALPHA LIPOIC ACID PO Take 1 capsule by mouth daily.      . Ascorbic Acid (VITAMIN C PO) Take 2 tablets by mouth daily.      . B Complex Vitamins (B COMPLEX PO) Take by mouth.      Marland Kitchen  dabigatran (PRADAXA) 150 MG CAPS capsule TAKE ONE CAPSULE BY MOUTH EVERY 12 HOURS  60 capsule  6  . folic acid (FOLVITE) 1 MG tablet Take 1 mg by mouth daily.      . furosemide (LASIX) 20 MG tablet Take 1 tablet (20 mg total) by mouth daily.  30 tablet  0  . GINKGO BILOBA PO Take 2 tablets by mouth daily.      . Ginseng (GIN-ZING PO) Take by mouth. bid      . Green Tea, Camillia sinensis, (GREEN TEA PO) Take 1 tablet by mouth daily.      Marland Kitchen MAGNESIUM PO Take 2 tablets by mouth daily.      . Methylsulfonylmethane (MSM PO) Take 3 tablets by mouth daily.      Marland Kitchen POTASSIUM PO Take 1 tablet by mouth daily.      . traMADol (ULTRAM) 50 MG tablet 50 mg. Take as needed for pain      . VITAMIN A PO Take 1 tablet by mouth daily.      Marland Kitchen VITAMIN E PO Take 1 tablet by mouth daily.       No current facility-administered medications for this visit.    Allergies  Allergen Reactions  . Penicillins Other (See Comments)    Unknown  . Latex Rash    History   Social History  . Marital Status: Divorced    Spouse Name: N/A    Number of Children: 0  . Years of Education: N/A   Occupational History  . Hairdresser    Social History Main Topics  . Smoking status: Never Smoker   . Smokeless tobacco: Not on file  . Alcohol Use: No  . Drug Use: No  . Sexual Activity: No   Other Topics Concern  . Not on file   Social History Narrative  . No narrative on file    Family History  Problem Relation Age of Onset  . Breast cancer Mother   . Congestive Heart Failure Mother   . Cancer Father     ?  Lymphoma  . Malignant hyperthermia Cousin     Review of Systems:  As stated in the HPI and otherwise negative.   BP 130/66  Pulse 72  Ht  (1.575 m)  Wt 249 lb (112.946 kg)  BMI 45.53 kg/m2  Physical Examination: General: Well developed, well nourished, NAD HEENT: OP clear, mucus membranes moist SKIN: warm, dry. No rashes. Neuro: No focal deficits Musculoskeletal: Muscle strength 5/5 all ext Psychiatric: Mood and affect normal Neck: No JVD, no carotid bruits, no thyromegaly, no lymphadenopathy. Lungs:Clear bilaterally, no wheezes, rhonci, crackles Cardiovascular: Irregular irregular. Loud harsh systolic murmur. No gallops or rubs. Abdomen:Soft. Bowel sounds present. Non-tender.  Extremities: Trace bilateral lower extremity edema. Pulses are 2 + in the bilateral DP/PT.  EKG: Atrial fib, rate 60 bpm. Poor R wave progression precordial leads.   Echo 04/19/14:  - Left ventricle: The cavity size was normal. Wall thickness was increased in a pattern of mild LVH. Systolic function was normal. The estimated ejection fraction was in the range of 60% to 65%. Wall motion was normal; there were no regional wall motion abnormalities. Features are consistent with a pseudonormal left ventricular filling pattern, with concomitant abnormal relaxation and increased filling pressure (grade 2 diastolic dysfunction). - Aortic valve: Mildly calcified annulus. Trileaflet; severely calcified leaflets. There was severe stenosis. There was no significant regurgitation. Mean gradient (S): 56 mm Hg. Valve area (VTI): 0.71 cm^2. Valve area (  Vmax): 0.78 cm^2. - Mitral valve: Calcified annulus. There was mild to moderate regurgitation. Valve area by continuity equation (using LVOT flow): 2.01 cm^2. - Left atrium: The atrium was severely dilated. - Right ventricle: The cavity size was at the upper limits of normal. - Right atrium: The atrium was moderately dilated. Central venous pressure (est): 15  mm Hg. - Tricuspid valve: There was mild regurgitation. - Pulmonary arteries: Systolic pressure was severely increased. PA peak pressure: 76 mm Hg (S). - Pericardium, extracardiac: There was no pericardial effusion. Impressions: - Mild LVH with LVEF 60-65%, probable grade 2 diastolic dysfunction. Severe left atrial enlargement. MAC with mild to moderate miral regurgitation. Severe calcific aortic stenosis with progression in gradients compared to prior study in January 2015. Severe pulmonary hypertension with PASP 76 mmHg, also progressed.  Assessment and Plan: 73 yo female with severe aortic valve stenosis, NYHA class 1-2 symptoms with comorbidities including COPD, Pulmonary HTN referred for evaluation for possible TAVR.   1. Severe aortic valve stenosis: She has severe aortic valve stenosis with mean gradient of 68 mm Hg. She describes NYHA class 1-2 symptoms with dyspnea with moderate exertion, although limited by knee pain. She has no angina or syncope. She has preferred a conservative approach to management of her aortic stenosis and would like to continue a non-aggressive approach if possible. I have reviewed in detail today the etiology of aortic stenosis including natural progression and symptoms. I have reviewed her echo images which demonstrate severe AS as described with a calcified tri-leaflet valve. At this time, she is not limited in her activities of daily living. She understands that she will need an aortic valve replacement, likely within the next year or two. I will review her case with our valve team. I will see her back in the valve clinic in 3 months. She will call if her clinical situation changes. She understands that she would be high risk for a traditional AVR. I have reviewed the TAVR procedure in detail. When we decide to move forward with AVR, she will need a cardiac cath and referral to CT surgery to assess risk for traditional AVR.   2. Pulmonary HTN: PA pressure  estimated at 76 mm Hg by echo August 2015.   3. COPD: PFTs done in March of this year describe a "moderate ventilatory defect" with evidence of airflow obstruction, normal lung volumes, moderately reduced DLCO  4. Chronic atrial fibrillation: Rate controlled. She is on Pradaxa.

## 2014-05-12 NOTE — Patient Instructions (Signed)
Your physician wants you to follow-up in: 3 months. You will receive a reminder letter in the mail two months in advance. If you don't receive a letter, please call our office to schedule the follow-up appointment.   

## 2014-08-16 ENCOUNTER — Ambulatory Visit (INDEPENDENT_AMBULATORY_CARE_PROVIDER_SITE_OTHER): Payer: Medicare Other | Admitting: Cardiology

## 2014-08-16 ENCOUNTER — Encounter: Payer: Self-pay | Admitting: Cardiology

## 2014-08-16 VITALS — BP 128/64 | HR 62 | Ht 63.0 in | Wt 249.0 lb

## 2014-08-16 DIAGNOSIS — I482 Chronic atrial fibrillation, unspecified: Secondary | ICD-10-CM

## 2014-08-16 DIAGNOSIS — I350 Nonrheumatic aortic (valve) stenosis: Secondary | ICD-10-CM

## 2014-08-16 NOTE — Assessment & Plan Note (Signed)
She continues on Pradaxa without reported bleeding problems.

## 2014-08-16 NOTE — Assessment & Plan Note (Signed)
Severe calcific aortic stenosis. Patient has associated lung disease with moderate ventilatory defect and airway obstruction, severe pulmonary hypertension, atrial fibrillation, and also reported family history of malignant hyperthermia resulting in 2 deaths with surgery. She is symptomatically stable at this point. Follow-up echocardiogram will be obtained, and she should keep her follow-up with Dr. Clifton JamesMcAlhany in the multidisciplinary valve clinic.

## 2014-08-16 NOTE — Progress Notes (Signed)
Reason for visit: Aortic stenosis, atrial fibrillation  Clinical Summary Ms. Sandra Turner is a 73 y.o.female last seen back in September by Dr. Clifton JamesMcAlhany. She comes in for a routine visit. She reports NYHA class II dyspnea without any significant deterioration, no exertional chest pain or progressive palpitations. Medications are unchanged, and reviewed below. She is due to schedule a follow-up visit with Dr. Clifton JamesMcAlhany in the multidisciplinary valve clinic for follow-up of her aortic stenosis.  Most recent echocardiogram done this August showed mild LVH with LVEF 60-65%, probable grade 2 diastolic dysfunction, severe left atrial enlargement, MAC with mild to moderate mitral regurgitation, severe calcific aortic stenosis (mean aortic gradient 56 mm mercury) with progression compared to prior study in January, severe pulmonary hypertension with PASP 76 mm mercury.   Allergies  Allergen Reactions  . Penicillins Other (See Comments)    Unknown  . Latex Rash    Current Outpatient Prescriptions  Medication Sig Dispense Refill  . ALPHA LIPOIC ACID PO Take 1 capsule by mouth daily.    . Ascorbic Acid (VITAMIN C PO) Take 2 tablets by mouth daily.    . B Complex Vitamins (B COMPLEX PO) Take by mouth.    . dabigatran (PRADAXA) 150 MG CAPS capsule TAKE ONE CAPSULE BY MOUTH EVERY 12 HOURS 60 capsule 6  . folic acid (FOLVITE) 1 MG tablet Take 1 mg by mouth daily.    . furosemide (LASIX) 20 MG tablet Take 1 tablet (20 mg total) by mouth daily. 30 tablet 0  . GINKGO BILOBA PO Take 2 tablets by mouth daily.    . Ginseng (GIN-ZING PO) Take by mouth. bid    . Green Tea, Camillia sinensis, (GREEN TEA PO) Take 1 tablet by mouth daily.    Marland Kitchen. MAGNESIUM PO Take 2 tablets by mouth daily.    . Methylsulfonylmethane (MSM PO) Take 3 tablets by mouth daily.    Marland Kitchen. POTASSIUM PO Take 1 tablet by mouth daily.    . traMADol (ULTRAM) 50 MG tablet 50 mg. Take as needed for pain    . VITAMIN A PO Take 1 tablet by mouth daily.      Marland Kitchen. VITAMIN E PO Take 1 tablet by mouth daily.     No current facility-administered medications for this visit.    Past Medical History  Diagnosis Date  . Arthritis   . History of pneumonia   . COPD (chronic obstructive pulmonary disease)   . Chronic atrial fibrillation   . Aortic stenosis     Severe by echo August 2015    Social History Ms. Sandra Turner reports that she has never smoked. She does not have any smokeless tobacco history on file. Ms. Sandra Turner reports that she does not drink alcohol.  Review of Systems Complete review of systems negative except as otherwise outlined in the clinical summary and also the following. No angina symptoms. No orthopnea or PND. She is limited by arthritic pains in her legs and back.  Physical Examination Filed Vitals:   08/16/14 1243  BP: 128/64  Pulse: 62   Filed Weights   08/16/14 1243  Weight: 249 lb (112.946 kg)    Obese woman, appears comfortable at rest.  HEENT: Conjunctiva and lids normal, oropharynx clear.  Neck: Supple, no elevated JVP or carotid bruits, no thyromegaly.  Lungs: Decreased breath sounds without wheezing, nonlabored breathing at rest.  Cardiac: Regular rate and rhythm, no S3 3/6 systolic murmur at base consistent with AS, no pericardial rub.  Abdomen: Soft, nontender, protuberant, bowel  sounds present, no guarding or rebound.  Extremities: No pitting edema, distal pulses 2+.    Problem List and Plan   Aortic stenosis Severe calcific aortic stenosis. Patient has associated lung disease with moderate ventilatory defect and airway obstruction, severe pulmonary hypertension, atrial fibrillation, and also reported family history of malignant hyperthermia resulting in 2 deaths with surgery. She is symptomatically stable at this point. Follow-up echocardiogram will be obtained, and she should keep her follow-up with Dr. Clifton JamesMcAlhany in the multidisciplinary valve clinic.   Atrial fibrillation She continues on  Pradaxa without reported bleeding problems.    Jonelle SidleSamuel G. Mitcheal Sweetin, M.D., F.A.C.C.

## 2014-08-16 NOTE — Patient Instructions (Addendum)
Your physician wants you to follow-up in: 6 months You will receive a reminder letter in the mail two months in advance. If you don't receive a letter, please call our office to schedule the follow-up appointment.   Please call [place] office to schedule apt with Dr.McAlhany [phone]   Your physician has requested that you have an echocardiogram. Echocardiography is a painless test that uses sound waves to create images of your heart. It provides your doctor with information about the size and shape of your heart and how well your heart's chambers and valves are working. This procedure takes approximately one hour. There are no restrictions for this procedure.  Your physician recommends that you continue on your current medications as directed. Please refer to the Current Medication list given to you today.      Thank you for choosing [facility] !

## 2014-08-23 ENCOUNTER — Ambulatory Visit (HOSPITAL_COMMUNITY): Payer: Medicare Other | Attending: Cardiology

## 2014-08-30 ENCOUNTER — Ambulatory Visit (HOSPITAL_COMMUNITY)
Admission: RE | Admit: 2014-08-30 | Discharge: 2014-08-30 | Disposition: A | Discharge status: Home / Self Care | Payer: Medicare Other | Source: Ambulatory Visit | Attending: Cardiology | Admitting: Cardiology

## 2014-08-30 DIAGNOSIS — I359 Nonrheumatic aortic valve disorder, unspecified: Secondary | ICD-10-CM | POA: Diagnosis present

## 2014-08-30 DIAGNOSIS — I059 Rheumatic mitral valve disease, unspecified: Secondary | ICD-10-CM

## 2014-08-30 NOTE — Progress Notes (Signed)
  Echocardiogram 2D Echocardiogram has been performed.  Leta JunglingCooper, [place] Jaquith M 08/30/2014, 4:36 PM

## 2014-09-02 NOTE — Progress Notes (Signed)
Called patient, no answer, left message on VM to call office

## 2014-09-03 DIAGNOSIS — J189 Pneumonia, unspecified organism: Secondary | ICD-10-CM

## 2014-09-03 HISTORY — DX: Pneumonia, unspecified organism: J18.9

## 2014-09-11 ENCOUNTER — Emergency Department (HOSPITAL_COMMUNITY): Payer: Medicare Other

## 2014-09-11 ENCOUNTER — Encounter (HOSPITAL_COMMUNITY): Payer: Self-pay | Admitting: Emergency Medicine

## 2014-09-11 ENCOUNTER — Inpatient Hospital Stay (HOSPITAL_COMMUNITY)
Admission: EM | Admit: 2014-09-11 | Discharge: 2014-09-15 | DRG: 190 | Disposition: A | Discharge status: Home / Self Care | Payer: Medicare Other | Attending: Internal Medicine | Admitting: Internal Medicine

## 2014-09-11 DIAGNOSIS — Z8701 Personal history of pneumonia (recurrent): Secondary | ICD-10-CM

## 2014-09-11 DIAGNOSIS — J42 Unspecified chronic bronchitis: Secondary | ICD-10-CM

## 2014-09-11 DIAGNOSIS — J9621 Acute and chronic respiratory failure with hypoxia: Secondary | ICD-10-CM | POA: Diagnosis present

## 2014-09-11 DIAGNOSIS — J189 Pneumonia, unspecified organism: Secondary | ICD-10-CM | POA: Diagnosis present

## 2014-09-11 DIAGNOSIS — J440 Chronic obstructive pulmonary disease with acute lower respiratory infection: Secondary | ICD-10-CM | POA: Diagnosis present

## 2014-09-11 DIAGNOSIS — I272 Other secondary pulmonary hypertension: Secondary | ICD-10-CM | POA: Diagnosis present

## 2014-09-11 DIAGNOSIS — I700 Atherosclerosis of aorta: Secondary | ICD-10-CM | POA: Diagnosis present

## 2014-09-11 DIAGNOSIS — Z91040 Latex allergy status: Secondary | ICD-10-CM | POA: Diagnosis not present

## 2014-09-11 DIAGNOSIS — I4891 Unspecified atrial fibrillation: Secondary | ICD-10-CM | POA: Diagnosis present

## 2014-09-11 DIAGNOSIS — Z880 Allergy status to penicillin: Secondary | ICD-10-CM

## 2014-09-11 DIAGNOSIS — J9601 Acute respiratory failure with hypoxia: Secondary | ICD-10-CM

## 2014-09-11 DIAGNOSIS — I482 Chronic atrial fibrillation: Secondary | ICD-10-CM | POA: Diagnosis present

## 2014-09-11 DIAGNOSIS — M1990 Unspecified osteoarthritis, unspecified site: Secondary | ICD-10-CM | POA: Diagnosis present

## 2014-09-11 DIAGNOSIS — R0602 Shortness of breath: Secondary | ICD-10-CM

## 2014-09-11 DIAGNOSIS — I350 Nonrheumatic aortic (valve) stenosis: Secondary | ICD-10-CM | POA: Diagnosis present

## 2014-09-11 DIAGNOSIS — J441 Chronic obstructive pulmonary disease with (acute) exacerbation: Principal | ICD-10-CM | POA: Diagnosis present

## 2014-09-11 DIAGNOSIS — J209 Acute bronchitis, unspecified: Secondary | ICD-10-CM | POA: Diagnosis present

## 2014-09-11 DIAGNOSIS — I10 Essential (primary) hypertension: Secondary | ICD-10-CM | POA: Diagnosis present

## 2014-09-11 DIAGNOSIS — Z8489 Family history of other specified conditions: Secondary | ICD-10-CM

## 2014-09-11 DIAGNOSIS — J449 Chronic obstructive pulmonary disease, unspecified: Secondary | ICD-10-CM | POA: Diagnosis present

## 2014-09-11 LAB — CBC
HCT: 38.2 % (ref 36.0–46.0)
Hemoglobin: 12.4 g/dL (ref 12.0–15.0)
MCH: 30.2 pg (ref 26.0–34.0)
MCHC: 32.5 g/dL (ref 30.0–36.0)
MCV: 93.2 fL (ref 78.0–100.0)
PLATELETS: 160 10*3/uL (ref 150–400)
RBC: 4.1 MIL/uL (ref 3.87–5.11)
RDW: 14.4 % (ref 11.5–15.5)
WBC: 6.6 10*3/uL (ref 4.0–10.5)

## 2014-09-11 LAB — COMPREHENSIVE METABOLIC PANEL
ALT: 18 U/L (ref 0–35)
AST: 30 U/L (ref 0–37)
Albumin: 4.1 g/dL (ref 3.5–5.2)
Alkaline Phosphatase: 104 U/L (ref 39–117)
Anion gap: 8 (ref 5–15)
BUN: 15 mg/dL (ref 6–23)
CO2: 28 mmol/L (ref 19–32)
CREATININE: 0.8 mg/dL (ref 0.50–1.10)
Calcium: 8.9 mg/dL (ref 8.4–10.5)
Chloride: 99 mEq/L (ref 96–112)
GFR calc non Af Amer: 72 mL/min — ABNORMAL LOW (ref >90)
GFR, EST AFRICAN AMERICAN: 83 mL/min — AB (ref >90)
Glucose, Bld: 123 mg/dL — ABNORMAL HIGH (ref 70–99)
Potassium: 3.8 mmol/L (ref 3.5–5.1)
Sodium: 135 mmol/L (ref 135–145)
TOTAL PROTEIN: 7.1 g/dL (ref 6.0–8.3)
Total Bilirubin: 0.7 mg/dL (ref 0.3–1.2)

## 2014-09-11 LAB — INFLUENZA PANEL BY PCR (TYPE A & B)
H1N1FLUPCR: NOT DETECTED
INFLAPCR: NEGATIVE
INFLBPCR: NEGATIVE

## 2014-09-11 LAB — BRAIN NATRIURETIC PEPTIDE: B Natriuretic Peptide: 234 pg/mL — ABNORMAL HIGH (ref 0.0–100.0)

## 2014-09-11 LAB — TROPONIN I

## 2014-09-11 MED ORDER — ONDANSETRON HCL 4 MG PO TABS: 4.0000 mg | ORAL_TABLET | Freq: Four times a day (QID) | ORAL | Status: DC | PRN

## 2014-09-11 MED ORDER — DILTIAZEM HCL 30 MG PO TABS
30.0000 mg | ORAL_TABLET | Freq: Four times a day (QID) | ORAL | Status: DC
  Administered 2014-09-11 – 2014-09-13 (×7): 30 mg via ORAL
  Filled 2014-09-11 (×7): qty 1

## 2014-09-11 MED ORDER — LEVOFLOXACIN 500 MG PO TABS
500.0000 mg | ORAL_TABLET | Freq: Once | ORAL | Status: AC
  Administered 2014-09-11: 500 mg via ORAL
  Filled 2014-09-11: qty 1

## 2014-09-11 MED ORDER — METHYLPREDNISOLONE SODIUM SUCC 125 MG IJ SOLR: 125.0000 mg | Freq: Once | INTRAMUSCULAR | Status: DC

## 2014-09-11 MED ORDER — DOXYCYCLINE HYCLATE 100 MG PO TABS
100.0000 mg | ORAL_TABLET | Freq: Two times a day (BID) | ORAL | Status: DC
  Administered 2014-09-12 (×2): 100 mg via ORAL
  Filled 2014-09-11 (×2): qty 1

## 2014-09-11 MED ORDER — TRAMADOL HCL 50 MG PO TABS
50.0000 mg | ORAL_TABLET | Freq: Four times a day (QID) | ORAL | Status: DC | PRN
  Filled 2014-09-11: qty 1

## 2014-09-11 MED ORDER — OSELTAMIVIR PHOSPHATE 75 MG PO CAPS
75.0000 mg | ORAL_CAPSULE | Freq: Two times a day (BID) | ORAL | Status: DC
  Administered 2014-09-11 – 2014-09-12 (×3): 75 mg via ORAL
  Filled 2014-09-11 (×3): qty 1

## 2014-09-11 MED ORDER — DABIGATRAN ETEXILATE MESYLATE 150 MG PO CAPS
150.0000 mg | ORAL_CAPSULE | Freq: Two times a day (BID) | ORAL | Status: DC
  Administered 2014-09-11 – 2014-09-15 (×8): 150 mg via ORAL
  Filled 2014-09-11 (×12): qty 1

## 2014-09-11 MED ORDER — ONDANSETRON HCL 4 MG/2ML IJ SOLN: 4.0000 mg | Freq: Four times a day (QID) | INTRAMUSCULAR | Status: DC | PRN

## 2014-09-11 MED ORDER — FUROSEMIDE 20 MG PO TABS: 20.0000 mg | ORAL_TABLET | Freq: Every day | ORAL | Status: DC

## 2014-09-11 MED ORDER — SODIUM CHLORIDE 0.9 % IV SOLN: 250.0000 mL | INTRAVENOUS | Status: DC | PRN

## 2014-09-11 MED ORDER — ONDANSETRON HCL 4 MG/2ML IJ SOLN
4.0000 mg | Freq: Once | INTRAMUSCULAR | Status: AC
  Administered 2014-09-11: 4 mg via INTRAVENOUS
  Filled 2014-09-11: qty 2

## 2014-09-11 MED ORDER — ACETAMINOPHEN 650 MG RE SUPP: 650.0000 mg | Freq: Four times a day (QID) | RECTAL | Status: DC | PRN

## 2014-09-11 MED ORDER — SODIUM CHLORIDE 0.9 % IJ SOLN
3.0000 mL | INTRAMUSCULAR | Status: DC | PRN
  Administered 2014-09-13: 3 mL via INTRAVENOUS
  Filled 2014-09-11: qty 3

## 2014-09-11 MED ORDER — SODIUM CHLORIDE 0.9 % IJ SOLN
3.0000 mL | Freq: Two times a day (BID) | INTRAMUSCULAR | Status: DC
  Administered 2014-09-12 – 2014-09-15 (×4): 3 mL via INTRAVENOUS

## 2014-09-11 MED ORDER — IPRATROPIUM-ALBUTEROL 0.5-2.5 (3) MG/3ML IN SOLN
3.0000 mL | RESPIRATORY_TRACT | Status: DC
  Administered 2014-09-11 – 2014-09-12 (×8): 3 mL via RESPIRATORY_TRACT
  Filled 2014-09-11 (×8): qty 3

## 2014-09-11 MED ORDER — DABIGATRAN ETEXILATE MESYLATE 150 MG PO CAPS
ORAL_CAPSULE | ORAL | Status: AC
  Filled 2014-09-11: qty 1

## 2014-09-11 MED ORDER — ACETAMINOPHEN 325 MG PO TABS: 650.0000 mg | ORAL_TABLET | Freq: Four times a day (QID) | ORAL | Status: DC | PRN

## 2014-09-11 MED ORDER — METHYLPREDNISOLONE SODIUM SUCC 40 MG IJ SOLR
40.0000 mg | Freq: Two times a day (BID) | INTRAMUSCULAR | Status: DC
  Administered 2014-09-11 – 2014-09-12 (×2): 40 mg via INTRAVENOUS
  Filled 2014-09-11 (×2): qty 1

## 2014-09-11 MED ORDER — ALBUTEROL (5 MG/ML) CONTINUOUS INHALATION SOLN
10.0000 mg/h | INHALATION_SOLUTION | Freq: Once | RESPIRATORY_TRACT | Status: AC
  Administered 2014-09-11: 10 mg/h via RESPIRATORY_TRACT
  Filled 2014-09-11: qty 20

## 2014-09-11 MED ORDER — SODIUM CHLORIDE 0.9 % IV BOLUS (SEPSIS)
500.0000 mL | Freq: Once | INTRAVENOUS | Status: AC
  Administered 2014-09-11: 500 mL via INTRAVENOUS

## 2014-09-11 NOTE — Plan of Care (Signed)
Problem: ICU Phase Progression Outcomes Goal: Voiding-avoid urinary catheter unless indicated Outcome: Not Applicable Date Met:  43/60/16 Patient voiding on own

## 2014-09-11 NOTE — ED Notes (Signed)
Patient with increasing shortness of breath x 1 week. Audible wheezing noted. 1 albuterol and 1 duoneb given in route to ED. Patient alert/oriented x 4.

## 2014-09-11 NOTE — ED Provider Notes (Signed)
CSN: [id number]     Arrival date & time 09/11/14  1032 History  This chart was scribed for Hilario Quarry, MD by Ronney Lion, ED Scribe. This patient was seen in room APA11/APA11 and the patient's care was started at 10:48 AM.    Chief Complaint  Patient presents with  . Shortness of Breath   The history is provided by the patient. No language interpreter was used.     HPI Comments: Sandra Turner is a 74 y.o. female with a history of chronic bronchitis and Afib brought in to the Emergency Department by ambulance complaining of SOB and chest congestion that began 3 days ago, following a period of generalized body aches that began 1 week ago. She endorses associated fever and productive cough with green-yellow sputum. Patient uses albuterol about 3 times per day and takes a daily fluid pill. Patient states she has had a pneumococcal vaccine. She denies having had a flu shot this year. She denies a history of hypertension, DM, CAD, and DVT/PE. She also denies a history of smoking. She denies rhinorrhea and sore throat.  Past Medical History  Diagnosis Date  . Arthritis   . History of pneumonia   . COPD (chronic obstructive pulmonary disease)   . Chronic atrial fibrillation   . Aortic stenosis     Severe by echo August 2015   Past Surgical History  Procedure Laterality Date  . None     Family History  Problem Relation Age of Onset  . Breast cancer Mother   . Congestive Heart Failure Mother   . Cancer Father     ? Lymphoma  . Malignant hyperthermia Cousin    History  Substance Use Topics  . Smoking status: Never Smoker   . Smokeless tobacco: Not on file  . Alcohol Use: No   OB History    No data available     Review of Systems  Constitutional: Positive for fever.  HENT: Negative for rhinorrhea and sore throat.   Respiratory: Positive for cough and shortness of breath.   Musculoskeletal: Positive for myalgias.  All other systems reviewed and are negative.     Allergies   Penicillins and Latex  Home Medications   Prior to Admission medications   Medication Sig Start Date End Date Taking? Authorizing Provider  ALPHA LIPOIC ACID PO Take 1 capsule by mouth daily.    Historical Provider, MD  Ascorbic Acid (VITAMIN C PO) Take 2 tablets by mouth daily.    Historical Provider, MD  B Complex Vitamins (B COMPLEX PO) Take by mouth.    Historical Provider, MD  dabigatran (PRADAXA) 150 MG CAPS capsule TAKE ONE CAPSULE BY MOUTH EVERY 12 HOURS 02/18/14   [name], MD  folic acid (FOLVITE) 1 MG tablet Take 1 mg by mouth daily.    Historical Provider, MD  furosemide (LASIX) 20 MG tablet Take 1 tablet (20 mg total) by mouth daily. 09/25/13   Standley Brooking, MD  GINKGO BILOBA PO Take 2 tablets by mouth daily.    Historical Provider, MD  Ginseng (GIN-ZING PO) Take by mouth. bid    Historical Provider, MD  Chilton Si Tea, Camillia sinensis, (GREEN TEA PO) Take 1 tablet by mouth daily.    Historical Provider, MD  MAGNESIUM PO Take 2 tablets by mouth daily.    Historical Provider, MD  Methylsulfonylmethane (MSM PO) Take 3 tablets by mouth daily.    Historical Provider, MD  POTASSIUM PO Take 1 tablet by mouth daily.  Historical Provider, MD  traMADol (ULTRAM) 50 MG tablet 50 mg. Take as needed for pain 05/03/14   Historical Provider, MD  VITAMIN A PO Take 1 tablet by mouth daily.    Historical Provider, MD  VITAMIN E PO Take 1 tablet by mouth daily.    Historical Provider, MD   BP 139/91 mmHg  Pulse 94  Resp 19  SpO2 100% Physical Exam  ED Course  Procedures (including critical care time)  DIAGNOSTIC STUDIES: Oxygen Saturation is 100% on 5 L O2, adequate by my interpretation.     Labs Review Labs Reviewed  COMPREHENSIVE METABOLIC PANEL - Abnormal; Notable for the following:    Glucose, Bld 123 (*)    GFR calc non Af Amer 72 (*)    GFR calc Af Amer 83 (*)    All other components within normal limits  BRAIN NATRIURETIC PEPTIDE - Abnormal; Notable for the  following:    B Natriuretic Peptide 234.0 (*)    All other components within normal limits  TROPONIN I  CBC    Imaging Review No results found.   EKG Interpretation   Date/Time:  Saturday September 11 2014 10:47:49 EST Ventricular Rate:  98 PR Interval:    QRS Duration: 85 QT Interval:  346 QTC Calculation: 442 R Axis:   87 Text Interpretation:  Atrial fibrillation Anteroseptal infarct, age  indeterminate No significant change since last tracing Confirmed by Christianna Belmonte  MD, Duwayne HeckANIELLE [phone]([id number]) on 09/11/2014 12:49:14 PM      MDM   Final diagnoses:  COPD with acute exacerbation   74 y.o. Female with aortic stenosis and copd presents with one week of infectious symptoms with associated productive cough and dyspnea.  Patient treated en rout with albuterol and iv steroids.  Here received 10 mg neb and levaquin.  Patient continues with expiratory wheezes and sats 89-90%.  Patietn without flu vaccine- had pneumococcal vaccine last year.  A fib chronic on pradexa- here hr at in 90s irregular, bp stable to high.   Aortic stenosis- no syncope or other acute symptoms,  Most recent echocardiogram done this August showed mild LVH with LVEF 60-65%, probable grade 2 diastolic dysfunction, severe left atrial enlargement, MAC with mild to moderate mitral regurgitation, severe calcific aortic stenosis (mean aortic gradient 56 mm mercury) with progression compared to prior study in January, severe pulmonary hypertension with PASP 76 mm mercury  I personally performed the services described in this documentation, which was scribed in my presence. The recorded information has been reviewed and considered.    Hilario Quarryanielle S Azar South, MD 09/12/14 1600

## 2014-09-11 NOTE — H&P (Signed)
History and Physical  Sandra AdasRita Foor [id number]:[id number] DOB: May 20, 1941 DOA: 09/11/2014  Referring physician: Dr. Margarita Grizzleanielle Ray in ED PCP: Ernestine ConradBLUTH, [name], MD  Cardiologist: Dr. Clifton JamesMcAlhany, Jonelle SidleSamuel G McDowell, MD   Chief Complaint: Short of breath  HPI:  74 year old woman with history of chronic bronchitis presented to the emergency department with one-week history of body aches, productive cough, chest congestion and shortness of breath with exertion. Initial evaluation revealed acute hypoxic respiratory failure, acute bronchitis.  Patient has a history of chronic bronchitis, uses an inhaler at home. Nonsmoker, never smoked. She reports positive sick contact approximately one week ago where she works. She then developed generalized fatigue, for aggressive shortness of breath, productive cough, chest congestion and wheezing. No fever.  In the emergency department maximum temperature 100.3. Heart rate 100s. Hypertensive. Chest x-ray cardiomegaly, mild interstitial edema, cannot exclude left pleural effusion, compared to previous study from January 2015, appears stable. Complete metabolic panel unremarkable, CBC stable, troponin negative.  Review of Systems:  Negative for fever, visual changes, rash, new muscle aches, chest pain, dysuria, bleeding, n/v/abdominal pain.  Positive for sore throat.  Past Medical History  Diagnosis Date  . Arthritis   . History of pneumonia   . COPD (chronic obstructive pulmonary disease)   . Chronic atrial fibrillation   . Aortic stenosis     Severe by echo August 2015    Past Surgical History  Procedure Laterality Date  . None      Social History:  reports that she has never smoked. She does not have any smokeless tobacco history on file. She reports that she does not drink alcohol or use illicit drugs.  Allergies  Allergen Reactions  . Penicillins Other (See Comments)    Unknown  . Latex Rash    Family History  Problem Relation Age of Onset  . Breast  cancer Mother   . Congestive Heart Failure Mother   . Cancer Father     ? Lymphoma  . Malignant hyperthermia Cousin      Prior to Admission medications   Medication Sig Start Date End Date Taking? Authorizing Provider  ALPHA LIPOIC ACID PO Take 1 capsule by mouth daily.   Yes Historical Provider, MD  Ascorbic Acid (VITAMIN C PO) Take 2 tablets by mouth daily.   Yes Historical Provider, MD  B Complex Vitamins (B COMPLEX PO) Take 1 tablet by mouth daily.    Yes Historical Provider, MD  dabigatran (PRADAXA) 150 MG CAPS capsule TAKE ONE CAPSULE BY MOUTH EVERY 12 HOURS 02/18/14  Yes Jonelle SidleSamuel G McDowell, MD  folic acid (FOLVITE) 1 MG tablet Take 1 mg by mouth daily.   Yes Historical Provider, MD  furosemide (LASIX) 20 MG tablet Take 1 tablet (20 mg total) by mouth daily. 09/25/13  Yes Standley Brookinganiel P Goodrich, MD  GINKGO BILOBA PO Take 2 tablets by mouth daily.   Yes Historical Provider, MD  Ginseng (GIN-ZING PO) Take 1 tablet by mouth 2 (two) times daily.    Yes Historical Provider, MD  Chilton SiGreen Tea, Camillia sinensis, (GREEN TEA PO) Take 1 tablet by mouth daily.   Yes Historical Provider, MD  MAGNESIUM PO Take 2 tablets by mouth daily.   Yes Historical Provider, MD  Methylsulfonylmethane (MSM PO) Take 3 tablets by mouth daily.   Yes Historical Provider, MD  Potassium 99 MG TABS Take 99 mg by mouth daily.   Yes Historical Provider, MD  VITAMIN A PO Take 1 tablet by mouth daily.   Yes Historical Provider, MD  VITAMIN E PO Take 1 tablet by mouth daily.   Yes Historical Provider, MD  traMADol (ULTRAM) 50 MG tablet Take 50 mg by mouth every 6 (six) hours as needed.  05/03/14   Historical Provider, MD   Physical Exam: Filed Vitals:   09/11/14 1116 09/11/14 1125 09/11/14 1128 09/11/14 1130  BP:   145/107 139/105  Pulse:   100 100  Temp:  100.3 F (37.9 C)    TempSrc:  Rectal    Resp:   23 17  SpO2: 100%  100% 100%    General: Examined in the emergency department. Appears calm, comfortable. Eyes: PERRL,  normal lids, irises  ENT: grossly normal hearing, lips & tongue Neck: no LAD, masses or thyromegaly Cardiovascular: RRR, no m/r/g. 1+ bilateral LE edema. Respiratory: Bilateral wheezes, no rhonchi or rales. Mild increased respiratory effort. Able to speak in full sentences. Abdomen: Appears grossly unremarkable Skin: no rash or induration seen  Musculoskeletal: grossly normal tone BUE/BLE Psychiatric: grossly normal mood and affect, speech fluent and appropriate Neurologic: grossly non-focal.  Wt Readings from Last 3 Encounters:  08/16/14 112.946 kg (249 lb)  05/12/14 112.946 kg (249 lb)  04/26/14 107.049 kg (236 lb)    Labs on Admission:  Basic Metabolic Panel:  Recent Labs Lab 09/11/14 1103  NA 135  K 3.8  CL 99  CO2 28  GLUCOSE 123*  BUN 15  CREATININE 0.80  CALCIUM 8.9    Liver Function Tests:  Recent Labs Lab 09/11/14 1103  AST 30  ALT 18  ALKPHOS 104  BILITOT 0.7  PROT 7.1  ALBUMIN 4.1   CBC:  Recent Labs Lab 09/11/14 1103  WBC 6.6  HGB 12.4  HCT 38.2  MCV 93.2  PLT 160    Cardiac Enzymes:  Recent Labs Lab 09/11/14 1103  TROPONINI <0.03    Radiological Exams on Admission: Dg Chest Port 1 View  09/11/2014   CLINICAL DATA:  Shortness of breath, increasing x1 week  EXAM: PORTABLE CHEST - 1 VIEW  COMPARISON:  09/22/2013  FINDINGS: Cardiomegaly with pulmonary vascular congestion and suspected mild interstitial edema.  No definite pleural effusion.  No pneumothorax.  IMPRESSION: Cardiomegaly with suspected mild interstitial edema.   Electronically Signed   By: Charline Bills M.D.   On: 09/11/2014 11:43    EKG: Independently reviewed. Atrial fibrillation, normal rate. Anteroseptal MI, old compared to previous study 09/22/2013.   Principal Problem:   Acute respiratory failure with hypoxia Active Problems:   Atrial fibrillation   Critical aortic valve stenosis   Pulmonary hypertension   COPD (chronic obstructive pulmonary disease)   Family  history of malignant hyperthermia   Chronic bronchitis with acute exacerbation   Assessment/Plan 1. Acute hypoxic respiratory failure secondary to acute bronchitis. 2. Acute on chronic bronchitis/COPD exacerbation. 3. Severe calcific aortic stenosis, associated lung disease with moderate ventilatory defect, airway obstruction, NYHA class II dyspnea, severe pulmonary hypertension. Continue to follow with Dr. [name] in multidisciplinary valve clinic. Chest x-ray suggests edema, however clinically there is no evidence of this.  4. Atrial fibrillation, maintained on Pradaxa. Appears stable. Mildly tachycardic secondary to acute illness. 5. Family history of malignant hyperthermia resulting in 2 deaths with surgery.   Appears stable for admission to medical floor.  Treat acute bronchitis, bronchodilators, steroids, antibiotics, oxygen.  Continue Pradaxa. Low-dose Cardizem while acutely ill to control heart rate.  Check flu, start empiric Tamiflu, respiratory precautions for now.  Code Status: full code  DVT prophylaxis: Pradaxa Family Communication: none present  Disposition Plan/Anticipated LOS: admit, 2-4 days  Time spent: 50 minutes  Brendia Sacks, MD  [facility] Pager [phone] 09/11/2014, 1:27 PM  Echocardiogram 08/30/2049  Study Conclusions  - Left ventricle: The cavity size was normal. Wall thickness was normal. Systolic function was normal. The estimated ejection fraction was in the range of 55% to 60%. - Aortic valve: AV is thickened, calcified with restricted motion. Peak nad mean gradients through the valve are 75 and 48 mm Hg respectively consistent with severe to critical AS> There was trivial regurgitation. - Mitral valve: There was mild regurgitation. - Left atrium: The atrium was moderately to severely dilated. - Right ventricle: The cavity size was mildly dilated. Systolic function was mildly reduced. - Right atrium: The atrium was  moderately dilated. - Pulmonary arteries: PA peak pressure: 62 mm Hg (S).

## 2014-09-12 LAB — BASIC METABOLIC PANEL
Anion gap: 9 (ref 5–15)
BUN: 33 mg/dL — AB (ref 6–23)
CHLORIDE: 98 meq/L (ref 96–112)
CO2: 27 mmol/L (ref 19–32)
Calcium: 8.9 mg/dL (ref 8.4–10.5)
Creatinine, Ser: 1.11 mg/dL — ABNORMAL HIGH (ref 0.50–1.10)
GFR calc Af Amer: 56 mL/min — ABNORMAL LOW (ref >90)
GFR calc non Af Amer: 48 mL/min — ABNORMAL LOW (ref >90)
Glucose, Bld: 145 mg/dL — ABNORMAL HIGH (ref 70–99)
Potassium: 4 mmol/L (ref 3.5–5.1)
SODIUM: 134 mmol/L — AB (ref 135–145)

## 2014-09-12 LAB — CBC
HEMATOCRIT: 34.5 % — AB (ref 36.0–46.0)
Hemoglobin: 11.5 g/dL — ABNORMAL LOW (ref 12.0–15.0)
MCH: 30.7 pg (ref 26.0–34.0)
MCHC: 33.3 g/dL (ref 30.0–36.0)
MCV: 92 fL (ref 78.0–100.0)
Platelets: 142 10*3/uL — ABNORMAL LOW (ref 150–400)
RBC: 3.75 MIL/uL — AB (ref 3.87–5.11)
RDW: 14.2 % (ref 11.5–15.5)
WBC: 7.8 10*3/uL (ref 4.0–10.5)

## 2014-09-12 MED ORDER — METHYLPREDNISOLONE SODIUM SUCC 40 MG IJ SOLR
40.0000 mg | INTRAMUSCULAR | Status: DC
  Administered 2014-09-13: 40 mg via INTRAVENOUS
  Filled 2014-09-12: qty 1

## 2014-09-12 MED ORDER — IPRATROPIUM-ALBUTEROL 0.5-2.5 (3) MG/3ML IN SOLN
3.0000 mL | RESPIRATORY_TRACT | Status: DC
  Administered 2014-09-12: 3 mL via RESPIRATORY_TRACT
  Filled 2014-09-12: qty 3

## 2014-09-12 NOTE — Progress Notes (Signed)
  PROGRESS NOTE  Sandra AdasRita Turner [id number] DOB: 1941-02-23 DOA: 09/11/2014 PCP: Ernestine ConradBLUTH, [name], MD  Summary: 74 year old woman with history of chronic bronchitis presented to the emergency department with one-week history of body aches, productive cough, chest congestion and shortness of breath with exertion. Initial evaluation revealed acute hypoxic respiratory failure, acute bronchitis.  Assessment/Plan: 1. Acute on chronic hypoxic respiratory failure secondary to acute bronchitis 2. Acute on chronic bronchitis, COPD exacerbation 3. Severe calcific aortic stenosis, associated lung disease with moderate ventilatory defect, airway obstruction, NYHA class II dyspnea, severe pulmonary hypertension. Continue to follow with Dr. Clifton JamesMcAlhany in multidisciplinary valve clinic. 4. Atrial fibrillation, stable. Continue projects at. 5. Family history of malignant hyperthermia resulting in 2 deaths with surgery   Appears improved.   Wean steroids, continue abx  Wean oxygen  Home 1-2 days  Code Status: full code DVT prophylaxis: Pradaxa Family Communication: none present Disposition Plan: home  Brendia Sacksaniel Jw Covin, MD  [facility]  Pager [phone] If 7PM-7AM, please contact night-coverage at [email or web address], password [email or web address] 09/12/2014, 9:11 AM  LOS: 1 day   Consultants:    Procedures:    Antibiotics:  Doxycycline 1/9 >>  HPI/Subjective: Feels better, less cough; breathing better. Slept poorly, no complaints.  Objective: Filed Vitals:   09/11/14 2332 09/12/14 0354 09/12/14 0616 09/12/14 0758  BP:   145/66   Pulse:   86   Temp:   98.8 F (37.1 C)   TempSrc:   Oral   Resp:   18   Height:      Weight:      SpO2: 92% 92% 93% 90%    Intake/Output Summary (Last 24 hours) at 09/12/14 0911 Last data filed at 09/12/14 0840  Gross per 24 hour  Intake    483 ml  Output      0 ml  Net    483 ml     Filed Weights   09/11/14 1551  Weight: 109.181 kg (240 lb 11.2 oz)    Exam:     Afebrile, VSS, hypoxia resolved General:  Appears calm and comfortable, sitting in chair eating breakfast Cardiovascular: RRR, no m/r/g. Trace bilateral LE edema. Respiratory: CTA bilaterally, no w/r/r. Normal respiratory effort. Psychiatric: grossly normal mood and affect, speech fluent and appropriate  Data Reviewed:  Flu screen negartive  BUN and creatinine somewhat elevated compared to yesterday  Hemoglobin stable 11.5  Pertinent data: Admission Labs   Imaging   Chest x-ray: Cardiomegaly with suspected mild interstitial edema Other    Pending data:    Scheduled Meds: . dabigatran  150 mg Oral Q12H  . diltiazem  30 mg Oral 4 times per day  . doxycycline  100 mg Oral Q12H  . ipratropium-albuterol  3 mL Nebulization Q4H  . methylPREDNISolone (SOLU-MEDROL) injection  40 mg Intravenous Q12H  . oseltamivir  75 mg Oral BID  . sodium chloride  3 mL Intravenous Q12H   Continuous Infusions:   Principal Problem:   Acute respiratory failure with hypoxia Active Problems:   Atrial fibrillation   Critical aortic valve stenosis   Pulmonary hypertension   COPD (chronic obstructive pulmonary disease)   Family history of malignant hyperthermia   Chronic bronchitis with acute exacerbation   COPD exacerbation   Time spent 20 minutes

## 2014-09-13 ENCOUNTER — Inpatient Hospital Stay (HOSPITAL_COMMUNITY): Payer: Medicare Other

## 2014-09-13 DIAGNOSIS — I270 Primary pulmonary hypertension: Secondary | ICD-10-CM

## 2014-09-13 LAB — CBC
HCT: 35.9 % — ABNORMAL LOW (ref 36.0–46.0)
HEMOGLOBIN: 11.8 g/dL — AB (ref 12.0–15.0)
MCH: 30.3 pg (ref 26.0–34.0)
MCHC: 32.9 g/dL (ref 30.0–36.0)
MCV: 92.3 fL (ref 78.0–100.0)
Platelets: 175 10*3/uL (ref 150–400)
RBC: 3.89 MIL/uL (ref 3.87–5.11)
RDW: 14.5 % (ref 11.5–15.5)
WBC: 12.7 10*3/uL — ABNORMAL HIGH (ref 4.0–10.5)

## 2014-09-13 LAB — EXPECTORATED SPUTUM ASSESSMENT W REFEX TO RESP CULTURE

## 2014-09-13 LAB — BASIC METABOLIC PANEL
Anion gap: 9 (ref 5–15)
BUN: 37 mg/dL — AB (ref 6–23)
CO2: 26 mmol/L (ref 19–32)
Calcium: 9.1 mg/dL (ref 8.4–10.5)
Chloride: 102 mEq/L (ref 96–112)
Creatinine, Ser: 0.93 mg/dL (ref 0.50–1.10)
GFR calc non Af Amer: 60 mL/min — ABNORMAL LOW (ref >90)
GFR, EST AFRICAN AMERICAN: 69 mL/min — AB (ref >90)
Glucose, Bld: 133 mg/dL — ABNORMAL HIGH (ref 70–99)
Potassium: 4.1 mmol/L (ref 3.5–5.1)
SODIUM: 137 mmol/L (ref 135–145)

## 2014-09-13 LAB — STREP PNEUMONIAE URINARY ANTIGEN: Strep Pneumo Urinary Antigen: NEGATIVE

## 2014-09-13 LAB — PROCALCITONIN: Procalcitonin: 0.16 ng/mL

## 2014-09-13 LAB — LACTIC ACID, PLASMA: LACTIC ACID, VENOUS: 1.9 mmol/L (ref 0.5–2.2)

## 2014-09-13 MED ORDER — OSELTAMIVIR PHOSPHATE 30 MG PO CAPS
30.0000 mg | ORAL_CAPSULE | Freq: Two times a day (BID) | ORAL | Status: DC
  Administered 2014-09-13 – 2014-09-14 (×2): 30 mg via ORAL
  Filled 2014-09-13 (×2): qty 1

## 2014-09-13 MED ORDER — VANCOMYCIN HCL 10 G IV SOLR
2000.0000 mg | Freq: Once | INTRAVENOUS | Status: AC
  Administered 2014-09-13: 2000 mg via INTRAVENOUS
  Filled 2014-09-13: qty 2000

## 2014-09-13 MED ORDER — VANCOMYCIN HCL 10 G IV SOLR
1250.0000 mg | INTRAVENOUS | Status: DC
  Administered 2014-09-14: 1250 mg via INTRAVENOUS
  Filled 2014-09-13: qty 1250

## 2014-09-13 MED ORDER — FUROSEMIDE 10 MG/ML IJ SOLN
20.0000 mg | Freq: Once | INTRAMUSCULAR | Status: AC
  Administered 2014-09-13: 20 mg via INTRAVENOUS
  Filled 2014-09-13: qty 2

## 2014-09-13 MED ORDER — OSELTAMIVIR PHOSPHATE 30 MG PO CAPS
30.0000 mg | ORAL_CAPSULE | ORAL | Status: AC
  Administered 2014-09-13: 30 mg via ORAL
  Filled 2014-09-13: qty 1

## 2014-09-13 MED ORDER — METHYLPREDNISOLONE SODIUM SUCC 40 MG IJ SOLR
40.0000 mg | Freq: Four times a day (QID) | INTRAMUSCULAR | Status: DC
  Administered 2014-09-13 – 2014-09-14 (×4): 40 mg via INTRAVENOUS
  Filled 2014-09-13 (×4): qty 1

## 2014-09-13 MED ORDER — LEVOFLOXACIN IN D5W 750 MG/150ML IV SOLN
750.0000 mg | INTRAVENOUS | Status: DC
  Administered 2014-09-13 – 2014-09-15 (×3): 750 mg via INTRAVENOUS
  Filled 2014-09-13 (×3): qty 150

## 2014-09-13 MED ORDER — ALBUTEROL SULFATE (2.5 MG/3ML) 0.083% IN NEBU: 2.5000 mg | INHALATION_SOLUTION | RESPIRATORY_TRACT | Status: DC | PRN

## 2014-09-13 MED ORDER — IPRATROPIUM-ALBUTEROL 0.5-2.5 (3) MG/3ML IN SOLN
3.0000 mL | Freq: Four times a day (QID) | RESPIRATORY_TRACT | Status: DC
  Administered 2014-09-13 – 2014-09-15 (×12): 3 mL via RESPIRATORY_TRACT
  Filled 2014-09-13 (×12): qty 3

## 2014-09-13 NOTE — Care Management Note (Signed)
    Page 1 of 1   09/15/2014     4:41:53 PM CARE MANAGEMENT NOTE 09/15/2014  Patient:  Sandra Turner,Sandra Turner   Account Number:  [id number]  Date Initiated:  09/13/2014  Documentation initiated by:  Sharrie RothmanBLACKWELL,Cliffard Hair C  Subjective/Objective Assessment:   Pt admitted from home with COPD exacerbation. Pt lives alone and will return home at discharge. Pt is independent with ADL's. Pt is still employeed.     Action/Plan:   Will follow for any discharge needs.   Anticipated DC Date:  09/16/2014   Anticipated DC Plan:  HOME/SELF CARE      DC Planning Services  CM consult      Choice offered to / List presented to:             Status of service:  Completed, signed off Medicare Important Message given?  YES (If response is "NO", the following Medicare IM given date fields will be blank) Date Medicare IM given:  09/15/2014 Medicare IM given by:  Sharrie RothmanBLACKWELL,Alexxia Stankiewicz C Date Additional Medicare IM given:   Additional Medicare IM given by:    Discharge Disposition:  HOME/SELF CARE  Per UR Regulation:    If discussed at Long Length of Stay Meetings, dates discussed:    Comments:  09/15/14 1640 Arlyss Queenammy Azie Mcconahy, RN BSN CM Pt discharged home today. If pt needs neb machine bedside RN [facility]). No other CM needs noted.  09/13/14 1420 Arlyss Queenammy Jaysin Gayler, RN BSN CM

## 2014-09-13 NOTE — Plan of Care (Signed)
Problem: Phase I Progression Outcomes Goal: Dyspnea controlled at rest Outcome: Progressing Patient dyspneic this AM at rest while sitting on side of bed, now with treatment patient is slightly less  SOB at rest and able to rest some

## 2014-09-13 NOTE — Care Management Utilization Note (Signed)
UR completed 

## 2014-09-13 NOTE — Progress Notes (Signed)
Respiratory Therap: Patient given neb tx; pretreatment patient breathing 30 as compared to 20 at first tx round this AM. Nurse present to admin meds; also Dr made round.  Charlott HollerMike Chellie Vanlue

## 2014-09-13 NOTE — Progress Notes (Signed)
Patient called out for RN stating she was "feeling bad".  Upon entering room patient is sitting up on side of bed and is SOB.  O2 was not on, so 2L O2 applied.  Stats maintaining 96% and above.  Expiratory wheezes to R side upon auscultation.  Patients states that having O2 has helped slightly.  MD called and made aware and will order PRN breathing tx.  Will continue to monitor.

## 2014-09-13 NOTE — Evaluation (Signed)
Feels a little better; vitals stable. Appears non-toxic.  Continue abx, follow closely.  Brendia Sacksaniel Mayrin Schmuck, MD [facility] [phone]

## 2014-09-13 NOTE — Progress Notes (Signed)
ANTIBIOTIC CONSULT NOTE  Pharmacy Consult for Vancomycin & renal dose adjustments (Levaquin, Tamiflu) Indication: pneumonia  Allergies  Allergen Reactions  . Penicillins Other (See Comments)    Unknown  . Latex Rash    Patient Measurements: Height: 5\' 2"  (157.5 cm) Weight: 240 lb 11.2 oz (109.181 kg) IBW/kg (Calculated) : 50.1  Vital Signs: Temp: 98.2 F (36.8 C) (01/11 1023) Temp Source: Oral (01/11 0606) BP: 157/76 mmHg (01/11 1023) Pulse Rate: 95 (01/11 1023) Intake/Output from previous day: 01/10 0701 - 01/11 0700 In: 843 [P.O.:840; I.V.:3] Out: -  Intake/Output from this shift: Total I/O In: 240 [P.O.:240] Out: -   Labs:  Recent Labs  09/11/14 1103 09/12/14 0639  WBC 6.6 7.8  HGB 12.4 11.5*  PLT 160 142*  CREATININE 0.80 1.11*   Estimated Creatinine Clearance: 53.3 mL/min (by C-G formula based on Cr of 1.11). No results for input(s): VANCOTROUGH, VANCOPEAK, VANCORANDOM, GENTTROUGH, GENTPEAK, GENTRANDOM, TOBRATROUGH, TOBRAPEAK, TOBRARND, AMIKACINPEAK, AMIKACINTROU, AMIKACIN in the last 72 hours.   Microbiology: No results found for this or any previous visit (from the past 720 hour(s)).  Anti-infectives    Start     Dose/Rate Route Frequency Ordered Stop   09/14/14 1000  vancomycin (VANCOCIN) 1,250 mg in sodium chloride 0.9 % 250 mL IVPB     1,250 mg166.7 mL/hr over 90 Minutes Intravenous Every 24 hours 09/13/14 1046     09/13/14 1200  vancomycin (VANCOCIN) 2,000 mg in sodium chloride 0.9 % 500 mL IVPB     2,000 mg250 mL/hr over 120 Minutes Intravenous  Once 09/13/14 1046     09/13/14 1100  levofloxacin (LEVAQUIN) IVPB 750 mg     750 mg100 mL/hr over 90 Minutes Intravenous Every 24 hours 09/13/14 1032 09/20/14 1059   09/12/14 1000  doxycycline (VIBRA-TABS) tablet 100 mg  Status:  Discontinued     100 mg Oral Every 12 hours 09/11/14 1550 09/13/14 1028   09/11/14 2200  oseltamivir (TAMIFLU) capsule 75 mg     75 mg Oral 2 times daily 09/11/14 1550 09/16/14  2159   09/11/14 1300  levofloxacin (LEVAQUIN) tablet 500 mg     500 mg Oral  Once 09/11/14 1249 09/11/14 1353      Assessment: 74 yo obese F admitted on 09/11/13 with COPD exacerbation, +Influenza A.  She has become more short of breath today & repeat CXR shows new PNA.  Antibiotic coverage being broadened.   She is afebrile with normal WBC.  Increase in Scr today.  NCrcl ~ 6350ml/min.  Vancomycin 1/11>> Levaquin 1/11>> Doxy 1/9>>1/11 Tamiflu 1/9>>  Goal of Therapy:  Vancomycin trough level 15-20 mcg/ml  Eradicate infection.  Plan:  Continue Levaquin 750mg  IV q24h Decrease Tamiflu 30mg  po BID for CrCl 30-6060ml/min Vancomycin 2gm IV loading dose followed by 1250mg  IV q24h Check Vancomycin trough at steady state Monitor renal function and cx data   Elson ClanLilliston, [name] 09/13/2014,10:46 AM

## 2014-09-13 NOTE — Plan of Care (Signed)
Problem: Phase I Progression Outcomes Goal: Dyspnea controlled at rest Outcome: Progressing Patient continues to have intermittent SOB.

## 2014-09-13 NOTE — Progress Notes (Addendum)
PROGRESS NOTE  Sandra Turner [id number] DOB: [birth year] DOA: 09/11/2014 PCP: Ernestine ConradBLUTH, [name], MD  Summary: 74 year old woman with history of chronic bronchitis presented to the emergency department with one-week history of body aches, productive cough, chest congestion and shortness of breath with exertion. Initial evaluation revealed acute hypoxic respiratory failure, acute bronchitis. Treated with standard treatment with quick improvement only to regress with recurrent wheezing and SOB. Plan continue IV steroids, nebs, abx and bronchodilators.   Assessment/Plan: 1. Acute on chronic hypoxic respiratory failure secondary to acute bronchitis; hypoxia resolved but more SOB today. 2. Acute on chronic bronchitis, COPD exacerbation, worse today. 3. Severe calcific aortic stenosis, associated lung disease with moderate ventilatory defect, airway obstruction, NYHA class II dyspnea, severe pulmonary hypertension. Continue to follow with Dr. Clifton JamesMcAlhany in multidisciplinary valve clinic. 4. Atrial fibrillation, stable. Continue Pradaxa. 5. Family history of malignant hyperthermia resulting in 2 deaths with surgery   Worse today, wheezing has recurred, not yet hypoxic. Began after eating but denies aspiration.   Plan continue abx, increase steroids, breathing tx now, repeat CXR; no evidence of significant volume overload on exam--f/u CXR, consider Lasix  Addendum CXR shows multifocal pneumonia not c/w aspiration, suspect developing CAP; monitor closely, check lactate and procalcitonin; low threshold transfer to stepdown.  Code Status: full code DVT prophylaxis: Pradaxa Family Communication: none present Disposition Plan: home  Brendia Sacksaniel Lathyn Griggs, MD  [facility]  Pager [phone] If 7PM-7AM, please contact night-coverage at [email or web address], password [email or web address] 09/13/2014, 9:36 AM  LOS: 2 days   Consultants:    Procedures:    Antibiotics:  Doxycycline 1/9 >>  HPI/Subjective: Started wheezing  all of a sudden this AM; no wheezing yesterday or last night; no SOB last night.  Started suddenly about 30 minutes ago after orange juice and grits; some abdominal bloating. Denies choking or aspiration.   No pain, no n/v.  Objective: Filed Vitals:   09/13/14 0434 09/13/14 0606 09/13/14 0803 09/13/14 0920  BP:  149/64    Pulse:  82    Temp:  98.7 F (37.1 C)    TempSrc:  Oral    Resp:  20    Height:      Weight:      SpO2: 92% 93% 90% 98%    Intake/Output Summary (Last 24 hours) at 09/13/14 0936 Last data filed at 09/12/14 1901  Gross per 24 hour  Intake    480 ml  Output      0 ml  Net    480 ml     Filed Weights   09/11/14 1551  Weight: 109.181 kg (240 lb 11.2 oz)    Exam:     Afebrile, VSS, no hypoxia General:  Sitting on side of bed, SOB, uncomfortable but not toxic Eyes: PERRL, normal lids, irises  ENT: grossly normal hearing Cardiovascular: RRR, no m/r/g. No LE edema. Respiratory: audible wheezing; diffuse wheezing all lung fields; no rhonchi or rales; mild to moderate increased resp effort Abdomen: soft, ntnd Skin: no rash or induration seen  Musculoskeletal: grossly normal tone BUE/BLE Psychiatric: grossly normal mood and affect, speech fluent and appropriate Neurologic: grossly non-focal.  Data Reviewed:  CBC and BMP pending  Pertinent data: Admission Labs   Imaging   Chest x-ray: Cardiomegaly with suspected mild interstitial edema Other    Pending data:    Scheduled Meds: . dabigatran  150 mg Oral Q12H  . doxycycline  100 mg Oral Q12H  . ipratropium-albuterol  3 mL Nebulization QID  . methylPREDNISolone (SOLU-MEDROL) injection  40  mg Intravenous Q6H  . oseltamivir  75 mg Oral BID  . sodium chloride  3 mL Intravenous Q12H   Continuous Infusions:   Principal Problem:   Chronic bronchitis with acute exacerbation Active Problems:   Atrial fibrillation   Critical aortic valve stenosis   Pulmonary hypertension   COPD (chronic  obstructive pulmonary disease)   Family history of malignant hyperthermia   Acute respiratory failure with hypoxia   COPD exacerbation   Time spent 25 minutes

## 2014-09-13 NOTE — Progress Notes (Signed)
Nutrition Brief Note  Patient identified on the Malnutrition Screening Tool (MST) Report  Wt Readings from Last 15 Encounters:  09/11/14 240 lb 11.2 oz (109.181 kg)  08/16/14 249 lb (112.946 kg)  05/12/14 249 lb (112.946 kg)  04/26/14 236 lb (107.049 kg)  10/26/13 248 lb (112.492 kg)  09/24/13 272 lb (123.378 kg)  05/18/13 260 lb (117.935 kg)  12/09/12 250 lb (113.399 kg)  10/20/12 254 lb (115.214 kg)  09/15/12 258 lb 4 oz (117.141 kg)  08/27/12 243 lb 2.7 oz (110.3 kg)    Body mass index is 44.01 kg/(m^2). Patient meets criteria for obesity class III based on current BMI. Weight loss desirable given her obesity. Usual weight range 240-250#  Current diet order is heart healthy, patient is consuming approximately 100% of meals at this time. Labs and medications reviewed.   No nutrition interventions warranted at this time. If nutrition issues arise, please consult RD.   Royann ShiversLynn Kamarri Fischetti MS,RD,CSG,LDN Office: [phone] Pager: [phone]

## 2014-09-14 DIAGNOSIS — A481 Legionnaires' disease: Secondary | ICD-10-CM

## 2014-09-14 LAB — BASIC METABOLIC PANEL
Anion gap: 9 (ref 5–15)
BUN: 29 mg/dL — AB (ref 6–23)
CO2: 29 mmol/L (ref 19–32)
Calcium: 9.4 mg/dL (ref 8.4–10.5)
Chloride: 106 mEq/L (ref 96–112)
Creatinine, Ser: 0.83 mg/dL (ref 0.50–1.10)
GFR calc Af Amer: 80 mL/min — ABNORMAL LOW (ref >90)
GFR, EST NON AFRICAN AMERICAN: 69 mL/min — AB (ref >90)
GLUCOSE: 135 mg/dL — AB (ref 70–99)
Potassium: 4.4 mmol/L (ref 3.5–5.1)
Sodium: 144 mmol/L (ref 135–145)

## 2014-09-14 MED ORDER — PREDNISONE 20 MG PO TABS
40.0000 mg | ORAL_TABLET | Freq: Every day | ORAL | Status: DC
  Administered 2014-09-15: 40 mg via ORAL
  Filled 2014-09-14: qty 2

## 2014-09-14 NOTE — Progress Notes (Signed)
  PROGRESS NOTE  Sandra Turner DOB: 09/21/1940 DOA: 09/11/2014 PCP: Ernestine ConradBLUTH, [name], MD  Summary: 74 year old woman with history of chronic bronchitis presented to the emergency department with one-week history of body aches, productive cough, chest congestion and shortness of breath with exertion. Initial evaluation revealed acute hypoxic respiratory failure, acute bronchitis. Treated with standard treatment with quick improvement only to regress with recurrent wheezing and SOB. Plan continue IV steroids, nebs, abx and bronchodilators.   Assessment/Plan: 1. Acute on chronic hypoxic respiratory failure secondary to multifocal pneumonia- positive for legionella- cont Levaquin. D/c Vancomycin today.  2. Acute on chronic bronchitis, COPD exacerbation- cont nebs- taper Solumedrol to Prednisone to start tmrw 3. Severe calcific aortic stenosis, associated lung disease with moderate ventilatory defect, airway obstruction, NYHA class II dyspnea, severe pulmonary hypertension. Continue to follow with Dr. Clifton JamesMcAlhany in multidisciplinary valve clinic. 4. Atrial fibrillation, stable. Continue Pradaxa. 5. Family history of malignant hyperthermia resulting in 2 deaths with surgery   Code Status: full code DVT prophylaxis: Pradaxa Family Communication: none present Disposition Plan: home  Calvert CantorSaima Houston Zapien, MD  [facility]  Pager - [email or web address], password [email or web address] 09/14/2014, 2:03 PM  LOS: 3 days   Consultants:    Procedures:    Antibiotics:  Doxycycline 1/9 >>1/11  Levaquin 1/11>>  Vancomycin 1/11- 1/12  HPI/Subjective: Still with cough and dyspnea on exertion.   Objective: Filed Vitals:   09/14/14 0712 09/14/14 1133 09/14/14 1155 09/14/14 1403  BP:    163/73  Pulse:    89  Temp:    96 F (35.6 C)  TempSrc:    Oral  Resp:    20  Height:      Weight:      SpO2: 95% 94% 96% 94%    Intake/Output Summary (Last 24 hours) at 09/14/14 1403 Last data filed at 09/14/14 1402  Gross per 24 hour  Intake    720 ml  Output    900 ml  Net   -180 ml     Filed Weights   09/11/14 1551  Weight: 109.181 kg (240 lb 11.2 oz)    Exam:     Afebrile, VSS, no hypoxia-  General:  Appears weak.  Eyes: PERRL, normal lids, irises  ENT: grossly normal hearing Cardiovascular: RRR, no m/r/g. No LE edema. Respiratory: b/l rhonchi with crackles in RLL Abdomen: soft, ntnd Skin: no rash or induration seen  Musculoskeletal: grossly normal tone BUE/BLE Psychiatric: grossly normal mood and affect, speech fluent and appropriate Neurologic: grossly non-focal.   Scheduled Meds: . dabigatran  150 mg Oral Q12H  . ipratropium-albuterol  3 mL Nebulization QID  . levofloxacin (LEVAQUIN) IV  750 mg Intravenous Q24H  . [START ON 09/15/2014] predniSONE  40 mg Oral Q breakfast  . sodium chloride  3 mL Intravenous Q12H   Continuous Infusions:   Principal Problem:   Chronic bronchitis with acute exacerbation Active Problems:   Atrial fibrillation   Critical aortic valve stenosis   Pulmonary hypertension   COPD (chronic obstructive pulmonary disease)   Family history of malignant hyperthermia   Acute respiratory failure with hypoxia   COPD exacerbation   Time spent 30 minutes

## 2014-09-14 NOTE — Progress Notes (Signed)
CRITICAL VALUE ALERT  Critical value received:  Positive Legionella Antigen  Date of notification:  09/14/2014   Time of notification:  10:02 AM   Critical value read back:Yes.    Nurse who received alert:  Cyndia DiverLindsi Jeylin Woodmansee, RN  MD notified (1st page):  Rizwan Time of first page:  10:03 AM   MD notified (2nd page):  Time of second page:  Responding MD:  Butler Denmarkizwan  Time MD responded:  10:04 AM

## 2014-09-15 DIAGNOSIS — I4891 Unspecified atrial fibrillation: Secondary | ICD-10-CM

## 2014-09-15 DIAGNOSIS — J441 Chronic obstructive pulmonary disease with (acute) exacerbation: Principal | ICD-10-CM

## 2014-09-15 LAB — BASIC METABOLIC PANEL
Anion gap: 8 (ref 5–15)
BUN: 29 mg/dL — AB (ref 6–23)
CALCIUM: 9.3 mg/dL (ref 8.4–10.5)
CO2: 29 mmol/L (ref 19–32)
Chloride: 106 mEq/L (ref 96–112)
Creatinine, Ser: 0.86 mg/dL (ref 0.50–1.10)
GFR calc Af Amer: 76 mL/min — ABNORMAL LOW (ref >90)
GFR calc non Af Amer: 66 mL/min — ABNORMAL LOW (ref >90)
GLUCOSE: 115 mg/dL — AB (ref 70–99)
Potassium: 4 mmol/L (ref 3.5–5.1)
SODIUM: 143 mmol/L (ref 135–145)

## 2014-09-15 LAB — PROCALCITONIN: Procalcitonin: <0.1 ng/mL

## 2014-09-15 MED ORDER — PREDNISONE 10 MG PO TABS: ORAL_TABLET | ORAL | Status: DC

## 2014-09-15 MED ORDER — LEVOFLOXACIN 750 MG PO TABS: 750.0000 mg | ORAL_TABLET | Freq: Every day | ORAL | Status: DC

## 2014-09-15 MED ORDER — SALINE SPRAY 0.65 % NA SOLN: 1.0000 | NASAL | Status: DC | PRN

## 2014-09-15 MED ORDER — ALBUTEROL SULFATE (2.5 MG/3ML) 0.083% IN NEBU: 2.5000 mg | INHALATION_SOLUTION | RESPIRATORY_TRACT | Status: AC | PRN

## 2014-09-15 NOTE — Progress Notes (Signed)
PHARMACIST - PHYSICIAN COMMUNICATION DR:   Butler Denmarkizwan CONCERNING: Antibiotic IV to Oral Route Change Policy  RECOMMENDATION: This patient is receiving Levaquin by the intravenous route.  Based on criteria approved by the Pharmacy and Therapeutics Committee, the antibiotic(s) is/are being converted to the equivalent oral dose form(s).   DESCRIPTION: These criteria include:  Patient being treated for a respiratory tract infection, urinary tract infection, cellulitis or clostridium difficile associated diarrhea if on metronidazole  The patient is not neutropenic and does not exhibit a GI malabsorption state  The patient is eating (either orally or via tube) and/or has been taking other orally administered medications for a least 24 hours  The patient is improving clinically and has a Tmax < 100.5  If you have questions about this conversion, please contact the Pharmacy Department  [x]   [phone]( [phone] )  [facility] []   [phone]( [phone] )  [facility]  []   [phone]( [phone] )  [facility] []   [phone]( [phone] )  [facility]   Valrie HartScott Gregrey Bloyd, PharmD

## 2014-09-15 NOTE — Discharge Summary (Signed)
Physician Discharge Summary  Sandra Turner [id number] DOB: Aug 17, 1941 DOA: 09/11/2014  PCP: Ernestine ConradBLUTH, [name], MD  Admit date: 09/11/2014 Discharge date: 09/15/2014  Time spent:10940minutes  Recommendations for Outpatient Follow-up:  1. Follow up with primary care physician in 1-2 weeks  Discharge Diagnoses:  Principal Problem:   Chronic bronchitis with acute exacerbation Active Problems:   Atrial fibrillation   Critical aortic valve stenosis   Pulmonary hypertension   COPD (chronic obstructive pulmonary disease)   Family history of malignant hyperthermia   Acute respiratory failure with hypoxia   COPD exacerbation  community-acquired pneumonia, Legionella  Discharge Condition: improved  Diet recommendation: low salt  Filed Weights   09/11/14 1551  Weight: 109.181 kg (240 lb 11.2 oz)    History of present illness:  This is a 74 year old patient that was managed in the hospital with progressive shortness of breath. She had generalized body aches for approximately 1 week prior to admission. This was associated with productive cough, chest congestion and shortness of breath on exertion. Was found to be mildly febrile with a temperature 100.3. She was admitted for further treatment of possible acute bronchitis  Hospital Course:  Patient was started on intravenous steroids, antibiotics and bronchodilators. She did significantly improve and is now approaching baseline. Chest x-ray showed developing multifocal pneumonia. Interestingly, her Legionella antigen did return positive. Her antibiotic regimen has been adjusted to oral Levaquin for appropriate Legionella coverage. She is afebrile and is feeling significantly improved. Patient has ambulated and did not have any significant hypoxia. She'll be continued on a course of antibiotics as an outpatient as well as a prednisone taper and bronchodilators. Her atrial fibrillation appears to be controlled and she continues on anticoagulation with  Pradaxa. She is otherwise stable for discharge home today.  Procedures:    Consultations:    Discharge Exam: Filed Vitals:   09/15/14 1543  BP: 170/99  Pulse: 93  Temp: 98.3 F (36.8 C)  Resp: 20    General: NAD Cardiovascular: S1, S2 RRR Respiratory: CTA B  Discharge Instructions   Discharge Instructions    DME Nebulizer machine    Complete by:  As directed      Diet - low sodium heart healthy    Complete by:  As directed      Increase activity slowly    Complete by:  As directed           Discharge Medication List as of 09/15/2014  6:21 PM    START taking these medications   Details  albuterol (PROVENTIL) (2.5 MG/3ML) 0.083% nebulizer solution Take 3 mLs (2.5 mg total) by nebulization every 4 (four) hours as needed for wheezing or shortness of breath., Starting 09/15/2014, Until Discontinued, Print    levofloxacin (LEVAQUIN) 750 MG tablet Take 1 tablet (750 mg total) by mouth daily., Starting 09/16/2014, Until Discontinued, Print    predniSONE (DELTASONE) 10 MG tablet Take 40mg  po daily for 2 days then 30mg  po daily for 2 days then 20mg  po daily for 2 days then 10mg  po daily for 2 days then stop, Print      CONTINUE these medications which have NOT CHANGED   Details  ALPHA LIPOIC ACID PO Take 1 capsule by mouth daily., Until Discontinued, Historical Med    Ascorbic Acid (VITAMIN C PO) Take 2 tablets by mouth daily., Until Discontinued, Historical Med    B Complex Vitamins (B COMPLEX PO) Take 1 tablet by mouth daily. , Until Discontinued, Historical Med    dabigatran (PRADAXA) 150 MG  CAPS capsule TAKE ONE CAPSULE BY MOUTH EVERY 12 HOURS, Normal    folic acid (FOLVITE) 1 MG tablet Take 1 mg by mouth daily., Until Discontinued, Historical Med    furosemide (LASIX) 20 MG tablet Take 1 tablet (20 mg total) by mouth daily., Starting 09/25/2013, Until Discontinued, Print    GINKGO BILOBA PO Take 2 tablets by mouth daily., Until Discontinued, Historical Med     Ginseng (GIN-ZING PO) Take 1 tablet by mouth 2 (two) times daily. , Until Discontinued, Historical Med    Green Tea, Camillia sinensis, (GREEN TEA PO) Take 1 tablet by mouth daily., Until Discontinued, Historical Med    MAGNESIUM PO Take 2 tablets by mouth daily., Until Discontinued, Historical Med    Methylsulfonylmethane (MSM PO) Take 3 tablets by mouth daily., Until Discontinued, Historical Med    Potassium 99 MG TABS Take 99 mg by mouth daily., Until Discontinued, Historical Med    VITAMIN A PO Take 1 tablet by mouth daily., Until Discontinued, Historical Med    VITAMIN E PO Take 1 tablet by mouth daily., Until Discontinued, Historical Med    traMADol (ULTRAM) 50 MG tablet Take 50 mg by mouth every 6 (six) hours as needed. , Starting 05/03/2014, Until Discontinued, Historical Med       Allergies  Allergen Reactions  . Penicillins Other (See Comments)    Unknown  . Latex Rash   Follow-up Information    Follow up with [name], MD. Schedule an appointment as soon as possible for a visit in 2 weeks.   Specialty:  Family Medicine   Contact information:   [address] [place] [address] [phone]        The results of significant diagnostics from this hospitalization (including imaging, microbiology, ancillary and laboratory) are listed below for reference.    Significant Diagnostic Studies: Dg Chest 2 View  09/13/2014   CLINICAL DATA:  Shortness of breath. COPD.  EXAM: CHEST  2 VIEW  COMPARISON:  Single view of the chest 09/11/2014.  FINDINGS: The lungs are emphysematous. There is bilateral airspace disease, worst in the right mid and lower lung zones and left lower lung zone. There is cardiomegaly. No pneumothorax or pleural effusion.  IMPRESSION: Appearance of the chest most consistent with multifocal pneumonia, worst the right.  Cardiomegaly.  Emphysema.   Electronically Signed   By: Drusilla Kanner M.D.   On: 09/13/2014 10:06   Dg Chest Port 1  View  09/11/2014   CLINICAL DATA:  Shortness of breath, increasing x1 week  EXAM: PORTABLE CHEST - 1 VIEW  COMPARISON:  09/22/2013  FINDINGS: Cardiomegaly with pulmonary vascular congestion and suspected mild interstitial edema.  No definite pleural effusion.  No pneumothorax.  IMPRESSION: Cardiomegaly with suspected mild interstitial edema.   Electronically Signed   By: Charline Bills M.D.   On: 09/11/2014 11:43    Microbiology: Recent Results (from the past 240 hour(s))  Culture, blood (routine x 2) Call MD if unable to obtain prior to antibiotics being given     Status: None (Preliminary result)   Collection Time: 09/13/14 10:50 AM  Result Value Ref Range Status   Specimen Description BLOOD LEFT ANTECUBITAL  Final   Special Requests BOTTLES DRAWN AEROBIC AND ANAEROBIC 12CC  Final   Culture NO GROWTH 2 DAYS  Final   Report Status PENDING  Incomplete  Culture, blood (routine x 2) Call MD if unable to obtain prior to antibiotics being given     Status: None (  Preliminary result)   Collection Time: 09/13/14 10:57 AM  Result Value Ref Range Status   Specimen Description BLOOD RIGHT ANTECUBITAL  Final   Special Requests BOTTLES DRAWN AEROBIC AND ANAEROBIC 10CC  Final   Culture NO GROWTH 2 DAYS  Final   Report Status PENDING  Incomplete  Culture, expectorated sputum-assessment     Status: None   Collection Time: 09/13/14  6:30 PM  Result Value Ref Range Status   Specimen Description SPUTUM EXPECTORATED  Final   Special Requests NONE  Final   Sputum evaluation   Final    MICROSCOPIC FINDINGS SUGGEST THAT THIS SPECIMEN IS NOT REPRESENTATIVE OF LOWER RESPIRATORY SECRETIONS. PLEASE RECOLLECT. Results Called to: RODGERS, S. AT 1928 ON 09/13/2014 BY BAUGHAM,M.    Report Status 09/13/2014 FINAL  Final     Labs: Basic Metabolic Panel:  Recent Labs Lab 09/11/14 1103 09/12/14 0639 09/13/14 1050 09/14/14 0613 09/15/14 0547  NA 135 134* 137 144 143  K 3.8 4.0 4.1 4.4 4.0  CL 99 98 102  106 106  CO2 GLUCOSE 123* 145* 133* 135* 115*  BUN 15 33* 37* 29* 29*  CREATININE 0.80 1.11* 0.93 0.83 0.86  CALCIUM 8.9 8.9 9.1 9.4 9.3   Liver Function Tests:  Recent Labs Lab 09/11/14 1103  AST 30  ALT 18  ALKPHOS 104  BILITOT 0.7  PROT 7.1  ALBUMIN 4.1   No results for input(s): LIPASE, AMYLASE in the last 168 hours. No results for input(s): AMMONIA in the last 168 hours. CBC:  Recent Labs Lab 09/11/14 1103 09/12/14 0639 09/13/14 1050  WBC 6.6 7.8 12.7*  HGB 12.4 11.5* 11.8*  HCT 38.2 34.5* 35.9*  MCV 93.2 92.0 92.3  PLT 160 142* 175   Cardiac Enzymes:  Recent Labs Lab 09/11/14 1103  TROPONINI <0.03   BNP: BNP (last 3 results)  Recent Labs  09/22/13 1739  PROBNP 1604.0*   CBG: No results for input(s): GLUCAP in the last 168 hours.     Signed:  MEMON,JEHANZEB  [facility] 09/15/2014, 8:18 PM

## 2014-09-15 NOTE — Progress Notes (Signed)
Patient's IV removed and was clean, dry, and intact at removal.  Patient received discharge instructions and scripts and had no further questions or concerns.  Patient was able to teach back about new medications and follow-up appointments.  Order for home nebulizer was called in and faxed to [facility] by nurse.  Informed patient that [facility] would be following up with patient to confirm pick-up of neb machine because [facility] no longer delivers them to the home.  Patient verbalized understand and stated that she would be able to pick up machine and medications.  Patient in stable condition at discharge.  Patient escorted to main entrance by nurse tech via wheelchair.

## 2014-09-16 NOTE — Care Management Utilization Note (Signed)
Ur completed     

## 2014-09-17 LAB — LEGIONELLA ANTIGEN, URINE

## 2014-09-18 LAB — CULTURE, BLOOD (ROUTINE X 2)
Culture: NO GROWTH
Culture: NO GROWTH

## 2014-09-22 ENCOUNTER — Other Ambulatory Visit: Payer: Self-pay | Admitting: Cardiology

## 2014-09-22 MED ORDER — DABIGATRAN ETEXILATE MESYLATE 150 MG PO CAPS: ORAL_CAPSULE | ORAL | Status: DC

## 2014-09-22 NOTE — Telephone Encounter (Signed)
Received fax refill request ° °Rx # [id number] °Medication:  Pradaxa 150 mg cap °Qty 30 °Sig:  Take one capsule by mouth every 12 hours °Physician:  [place] ° ° °

## 2014-09-27 ENCOUNTER — Other Ambulatory Visit: Payer: Self-pay | Admitting: Cardiology

## 2014-09-27 MED ORDER — DABIGATRAN ETEXILATE MESYLATE 150 MG PO CAPS: ORAL_CAPSULE | ORAL | Status: DC

## 2014-09-27 NOTE — Telephone Encounter (Signed)
Received fax refill request ° °Rx # [id number] °Medication:  Pradaxa 150 mg cap °Qty 30 °Sig:  Take one capsule by mouth every 12 hours °Physician:  [place] ° ° °

## 2014-09-27 NOTE — Telephone Encounter (Signed)
E-scribed rx complete 

## 2014-10-04 ENCOUNTER — Ambulatory Visit (INDEPENDENT_AMBULATORY_CARE_PROVIDER_SITE_OTHER): Payer: Medicare Other | Admitting: Cardiovascular Disease

## 2014-10-04 ENCOUNTER — Encounter: Payer: Self-pay | Admitting: Cardiovascular Disease

## 2014-10-04 VITALS — BP 130/64 | HR 83 | Ht 62.0 in | Wt 241.6 lb

## 2014-10-04 DIAGNOSIS — J449 Chronic obstructive pulmonary disease, unspecified: Secondary | ICD-10-CM

## 2014-10-04 DIAGNOSIS — I270 Primary pulmonary hypertension: Secondary | ICD-10-CM

## 2014-10-04 DIAGNOSIS — I482 Chronic atrial fibrillation, unspecified: Secondary | ICD-10-CM

## 2014-10-04 DIAGNOSIS — I350 Nonrheumatic aortic (valve) stenosis: Secondary | ICD-10-CM

## 2014-10-04 DIAGNOSIS — I272 Pulmonary hypertension, unspecified: Secondary | ICD-10-CM

## 2014-10-04 NOTE — Progress Notes (Addendum)
                                                                                                                                                                                                                   [facility]   History of Present Illness: 74 yo female with history of aortic stenosis, chronic atrial fibrillation on Pradaxa, COPD/bronchitis, pulmonary HTN here today for cardiac followup. I saw her as a new patient for evaluation of aortic valve stenosis 05/12/14 and consideration for possible transcatheter aortic valve replacement. She is known to have severe aortic valve stenosis. Echo 04/19/14 with normal LV size and systolic function, LVEF=60-65%. There is severe aortic valve stenosis with mean gradient of 56 mm Hg, peak gradient 96 mm Hg. AVA 0.71cm2. Mild to moderate MR. PA pressure estimated at 76 mm Hg.  I saw her in September 2015 and she had no complaints at that time. Her dyspnea seemed to be related to episodes of bronchitis. Of note, there is a family history of malignant hyperthermia with deaths of 2 family members during surgery.   She is here today for follow up. he works full days and is not limited by any chest pain or SOB. She notes dyspnea when her "bronchitis is acting up". She has no exertional dyspnea at this time although she admits that she does have dyspnea when there is pollen in the air. No exertional chest pain. She states that she has been battling bronchitis/asthma for years. She has never been a smoker. She has chronic lower extemity edema and uses Lasix daily. She is limited in strenuous activities by arthritis and pain in her knees. She has had no syncopal events.   Primary Care Physician: [name] Referring Physician: Dr. Samuel McDowell   Past Medical History  Diagnosis Date  . Arthritis   . History of pneumonia   . COPD (chronic obstructive pulmonary disease)   . Chronic atrial fibrillation   . Aortic stenosis     Severe by echo  August 2015    Past Surgical History  Procedure Laterality Date  . None      Current Outpatient Prescriptions  Medication Sig Dispense Refill  . albuterol (PROVENTIL) (2.5 MG/3ML) 0.083% nebulizer solution Take 3 mLs (2.5 mg total) by nebulization every 4 (four) hours as needed for wheezing or shortness of breath. 75 mL 12  . ALPHA LIPOIC ACID PO Take 1 capsule by mouth daily.    . Ascorbic Acid (VITAMIN C PO) Take 2 tablets   by mouth daily.    . B Complex Vitamins (B COMPLEX PO) Take 1 tablet by mouth daily.     . dabigatran (PRADAXA) 150 MG CAPS capsule TAKE ONE CAPSULE BY MOUTH EVERY 12 HOURS 60 capsule 6  . folic acid (FOLVITE) 1 MG tablet Take 1 mg by mouth daily.    . furosemide (LASIX) 20 MG tablet Take 1 tablet (20 mg total) by mouth daily. 30 tablet 0  . GINKGO BILOBA PO Take 2 tablets by mouth daily.    . Ginseng (GIN-ZING PO) Take 1 tablet by mouth 2 (two) times daily.     . Green Tea, Camillia sinensis, (GREEN TEA PO) Take 1 tablet by mouth daily.    . MAGNESIUM PO Take 2 tablets by mouth daily.    . Methylsulfonylmethane (MSM PO) Take 3 tablets by mouth daily.    . Potassium 99 MG TABS Take 99 mg by mouth daily.    . traMADol (ULTRAM) 50 MG tablet Take 50 mg by mouth every 6 (six) hours as needed.     . VITAMIN A PO Take 1 tablet by mouth daily.    . VITAMIN E PO Take 1 tablet by mouth daily.     No current facility-administered medications for this visit.    Allergies  Allergen Reactions  . Penicillins Other (See Comments)    Unknown  . Latex Rash    History   Social History  . Marital Status: Divorced    Spouse Name: N/A    Number of Children: 0  . Years of Education: N/A   Occupational History  . Hairdresser    Social History Main Topics  . Smoking status: Never Smoker   . Smokeless tobacco: Not on file  . Alcohol Use: No  . Drug Use: No  . Sexual Activity: No   Other Topics Concern  . Not on file   Social History Narrative    Family History   Problem Relation Age of Onset  . Breast cancer Mother   . Congestive Heart Failure Mother   . Cancer Father     ? Lymphoma  . Malignant hyperthermia Cousin     Review of Systems:  As stated in the HPI and otherwise negative.   BP 130/64 mmHg  Pulse 83  Ht 5' 2" (1.575 m)  Wt 241 lb 9.6 oz (109.589 kg)  BMI 44.18 kg/m2  SpO2 95%  Physical Examination: General: Well developed, well nourished, NAD HEENT: OP clear, mucus membranes moist SKIN: warm, dry. No rashes. Neuro: No focal deficits Musculoskeletal: Muscle strength 5/5 all ext Psychiatric: Mood and affect normal Neck: No JVD, no carotid bruits, no thyromegaly, no lymphadenopathy. Lungs:Clear bilaterally, no wheezes, rhonci, crackles Cardiovascular: Irregular irregular. Loud harsh systolic murmur. No gallops or rubs. Abdomen:Soft. Bowel sounds present. Non-tender.  Extremities: Trace bilateral lower extremity edema. Pulses are 2 + in the bilateral DP/PT.   Echo 08/30/14: Left ventricle: The cavity size was normal. Wall thickness was normal. Systolic function was normal. The estimated ejection fraction was in the range of 55% to 60%. - Aortic valve: AV is thickened, calcified with restricted motion. Peak nad mean gradients through the valve are 75 and 48 mm Hg respectively consistent with severe to critical AS> There was trivial regurgitation. - Mitral valve: There was mild regurgitation. - Left atrium: The atrium was moderately to severely dilated. - Right ventricle: The cavity size was mildly dilated. Systolic function was mildly reduced. - Right atrium: The atrium was moderately dilated. -   Pulmonary arteries: PA peak pressure: 62 mm Hg (S).  STS Risk Calculator:  Risk of Mortality: 2.523%  Morbidity or Mortality: 14.84%  Long Length of Stay: 7.897%  Short Length of Stay: 31.51%  Permanent Stroke: 0.987%  Prolonged Ventilation: 10.721%  DSW Infection: 0.733%    Assessment and Plan: 74 yo female  with severe aortic valve stenosis, NYHA class 2 symptoms with comorbidities including COPD, Pulmonary HTN here today for follow up discussion regarding her AS. I saw her in September 2015 and she was not ready to discuss valve replacement.    1. Severe aortic valve stenosis: She has severe aortic valve stenosis with mean gradient of 56 mm Hg by echo August 2015, 48 mmHg by echo December 2015. She describes NYHA class 2 symptoms with dyspnea with mild to moderate exertion, although limited by knee pain. She has no angina or syncope. She has had a recent bout of pneumonia/COPD exacerbation. She has stable diastolic CHF. She has preferred a conservative approach to management of her aortic stenosis but at this time is willing to pursue therapeutic options. I have reviewed in detail today the etiology of aortic stenosis including natural progression and symptoms. I have reviewed her echo images which demonstrate severe AS as described with a calcified tri-leaflet valve. She will be high risk for traditional AVR with obesity, immobility due to chronic knee pain and severe COPD although her STS risk score suggests mortality of 2.5% with traditional open AVR. This is calculated before her cardiac cath is performed. She is not known to have CAD. I have reviewed the TAVR procedure in detail. I will have her seen in CT surgery to discuss her treatment options. She will need to be set up for a cardiac cath also. This will be arranged over the next few weeks. I will wait to order her CTA abd/pelvis,chest and Cardiac CT after her visit in the CT surgery office.     2. Pulmonary HTN: PA pressure estimated at 62 mm Hg by echo December 2015.   3. COPD: PFTs done in March of this year with severe obstructive defect.   4. Chronic atrial fibrillation: Rate controlled. She is anti-coagulated with Pradaxa.   Addendum 10/19/14: Right and left heart cath on 11/01/14. Will then refer to see CT surgery 

## 2014-10-04 NOTE — Patient Instructions (Signed)
You have been referred to  Dr. Laneta SimmersBartle or Dr. Cornelius Moraswen at [facility]

## 2014-10-18 ENCOUNTER — Telehealth: Payer: Self-pay | Admitting: Cardiovascular Disease

## 2014-10-18 DIAGNOSIS — I350 Nonrheumatic aortic (valve) stenosis: Secondary | ICD-10-CM

## 2014-10-18 NOTE — Telephone Encounter (Signed)
Attempted to call Sandra Turner to discuss arranging right and left heart cath. This will need to be done before she is seen in the surgery office. I will be glad to discuss with her and go over details of the procedure. Can we try to call her to start discussions? Will need to be on one of my cath days over next few weeks if we can arrange. Thanks, chris

## 2014-10-19 ENCOUNTER — Encounter: Payer: Self-pay | Admitting: *Deleted

## 2014-10-19 NOTE — Addendum Note (Signed)
Addended by: Verne CarrowMCALHANY, Clairessa Boulet D on: 10/19/2014 04:24 PM   Modules accepted: Orders

## 2014-10-19 NOTE — Telephone Encounter (Signed)
Dr. Clifton JamesMcAlhany spoke with pt and she would like to proceed with right and left heart cath.  Arranged for 11/01/14 at 10:30.  I verbally went over all instructions with pt and will mail copy to her.  She will come to office on 10/25/14 for lab work

## 2014-10-21 NOTE — ED Provider Notes (Signed)
Addendum to 09/11/14 note  PE Filed Vitals:     09/11/14 1116  09/11/14 1125  09/11/14 1128  09/11/14 1130   BP:      145/107  139/105   Pulse:      100  100   Temp:    100.3 F (37.9 C)       TempSrc:    Rectal       Resp:      23  17   SpO2:  100%    100%  100%        General: Examined in the emergency department. Appears calm, comfortable.  Eyes: PERRL, normal lids, irises    ENT: grossly normal hearing, lips & tongue  Neck: no LAD, masses or thyromegaly  Cardiovascular: RRR, no m/r/g. 1+ bilateral LE edema.  Respiratory: Bilateral wheezes, no rhonchi or rales. Mild increased respiratory effort. Able to speak in full sentences.  Abdomen: Appears grossly unremarkable  Skin: no rash or induration seen    Musculoskeletal: grossly normal tone BUE/BLE  Psychiatric: grossly normal mood and affect, speech fluent and appropriate  Neurologic: grossly non-focal.    Hilario Quarryanielle S Brook Geraci, MD 10/21/14 1208

## 2014-10-25 ENCOUNTER — Other Ambulatory Visit (INDEPENDENT_AMBULATORY_CARE_PROVIDER_SITE_OTHER): Payer: Medicare Other | Admitting: *Deleted

## 2014-10-25 DIAGNOSIS — I350 Nonrheumatic aortic (valve) stenosis: Secondary | ICD-10-CM

## 2014-10-25 LAB — BASIC METABOLIC PANEL
BUN: 16 mg/dL (ref 6–23)
CO2: 29 meq/L (ref 19–32)
Calcium: 9.1 mg/dL (ref 8.4–10.5)
Chloride: 104 mEq/L (ref 96–112)
Creatinine, Ser: 0.81 mg/dL (ref 0.40–1.20)
GFR: 73.77 mL/min (ref >60.00)
Glucose, Bld: 91 mg/dL (ref 70–99)
Potassium: 4 mEq/L (ref 3.5–5.1)
Sodium: 141 mEq/L (ref 135–145)

## 2014-10-25 LAB — CBC WITH DIFFERENTIAL/PLATELET
BASOS ABS: 0 10*3/uL (ref 0.0–0.1)
Basophils Relative: 0.6 % (ref 0.0–3.0)
Eosinophils Absolute: 0.5 10*3/uL (ref 0.0–0.7)
Eosinophils Relative: 7.6 % — ABNORMAL HIGH (ref 0.0–5.0)
HEMATOCRIT: 36 % (ref 36.0–46.0)
Hemoglobin: 12.1 g/dL (ref 12.0–15.0)
LYMPHS ABS: 1.4 10*3/uL (ref 0.7–4.0)
LYMPHS PCT: 22.1 % (ref 12.0–46.0)
MCHC: 33.7 g/dL (ref 30.0–36.0)
MCV: 88.3 fl (ref 78.0–100.0)
Monocytes Absolute: 0.7 10*3/uL (ref 0.1–1.0)
Monocytes Relative: 11.2 % (ref 3.0–12.0)
NEUTROS ABS: 3.6 10*3/uL (ref 1.4–7.7)
Neutrophils Relative %: 58.5 % (ref 43.0–77.0)
Platelets: 196 10*3/uL (ref 150.0–400.0)
RBC: 4.07 Mil/uL (ref 3.87–5.11)
RDW: 15.2 % (ref 11.5–15.5)
WBC: 6.2 10*3/uL (ref 4.0–10.5)

## 2014-10-25 LAB — PROTIME-INR
INR: 1.2 ratio — ABNORMAL HIGH (ref 0.8–1.0)
PROTHROMBIN TIME: 13.8 s — AB (ref 9.6–13.1)

## 2014-11-01 ENCOUNTER — Encounter (HOSPITAL_COMMUNITY): Payer: Self-pay | Admitting: Cardiovascular Disease

## 2014-11-01 ENCOUNTER — Encounter (HOSPITAL_COMMUNITY)
Admission: RE | Disposition: A | Discharge status: Home / Self Care | Payer: Self-pay | Source: Ambulatory Visit | Attending: Cardiovascular Disease

## 2014-11-01 ENCOUNTER — Ambulatory Visit (HOSPITAL_COMMUNITY)
Admission: RE | Admit: 2014-11-01 | Discharge: 2014-11-01 | Disposition: A | Discharge status: Home / Self Care | Payer: Medicare Other | Source: Ambulatory Visit | Attending: Cardiovascular Disease | Admitting: Cardiovascular Disease

## 2014-11-01 DIAGNOSIS — M1990 Unspecified osteoarthritis, unspecified site: Secondary | ICD-10-CM | POA: Insufficient documentation

## 2014-11-01 DIAGNOSIS — I482 Chronic atrial fibrillation: Secondary | ICD-10-CM | POA: Insufficient documentation

## 2014-11-01 DIAGNOSIS — I5030 Unspecified diastolic (congestive) heart failure: Secondary | ICD-10-CM | POA: Diagnosis not present

## 2014-11-01 DIAGNOSIS — Z8701 Personal history of pneumonia (recurrent): Secondary | ICD-10-CM | POA: Diagnosis not present

## 2014-11-01 DIAGNOSIS — I350 Nonrheumatic aortic (valve) stenosis: Secondary | ICD-10-CM | POA: Diagnosis present

## 2014-11-01 DIAGNOSIS — Z79899 Other long term (current) drug therapy: Secondary | ICD-10-CM | POA: Diagnosis not present

## 2014-11-01 DIAGNOSIS — I272 Other secondary pulmonary hypertension: Secondary | ICD-10-CM | POA: Diagnosis not present

## 2014-11-01 DIAGNOSIS — J449 Chronic obstructive pulmonary disease, unspecified: Secondary | ICD-10-CM | POA: Diagnosis not present

## 2014-11-01 DIAGNOSIS — Z79891 Long term (current) use of opiate analgesic: Secondary | ICD-10-CM | POA: Insufficient documentation

## 2014-11-01 DIAGNOSIS — Z7902 Long term (current) use of antithrombotics/antiplatelets: Secondary | ICD-10-CM | POA: Insufficient documentation

## 2014-11-01 DIAGNOSIS — Z8249 Family history of ischemic heart disease and other diseases of the circulatory system: Secondary | ICD-10-CM | POA: Diagnosis not present

## 2014-11-01 HISTORY — PX: LEFT AND RIGHT HEART CATHETERIZATION WITH CORONARY ANGIOGRAM: SHX5449

## 2014-11-01 SURGERY — LEFT AND RIGHT HEART CATHETERIZATION WITH CORONARY ANGIOGRAM
Anesthesia: LOCAL

## 2014-11-01 MED ORDER — SODIUM CHLORIDE 0.9 % IV SOLN: INTRAVENOUS | Status: AC

## 2014-11-01 MED ORDER — SODIUM CHLORIDE 0.9 % IV SOLN
INTRAVENOUS | Status: DC
  Administered 2014-11-01: 14:00:00 via INTRAVENOUS

## 2014-11-01 MED ORDER — ASPIRIN 81 MG PO CHEW
CHEWABLE_TABLET | ORAL | Status: AC
Start: 2014-11-01 — End: 2014-11-01
  Administered 2014-11-01: 81 mg via ORAL
  Filled 2014-11-01: qty 1

## 2014-11-01 MED ORDER — SODIUM CHLORIDE 0.9 % IJ SOLN: 3.0000 mL | Freq: Two times a day (BID) | INTRAMUSCULAR | Status: DC

## 2014-11-01 MED ORDER — HEPARIN (PORCINE) IN NACL 2-0.9 UNIT/ML-% IJ SOLN
INTRAMUSCULAR | Status: AC
  Filled 2014-11-01: qty 1000

## 2014-11-01 MED ORDER — FENTANYL CITRATE 0.05 MG/ML IJ SOLN
INTRAMUSCULAR | Status: AC
  Filled 2014-11-01: qty 2

## 2014-11-01 MED ORDER — SODIUM CHLORIDE 0.9 % IJ SOLN: 3.0000 mL | INTRAMUSCULAR | Status: DC | PRN

## 2014-11-01 MED ORDER — ASPIRIN 81 MG PO CHEW
81.0000 mg | CHEWABLE_TABLET | ORAL | Status: AC
  Administered 2014-11-01: 81 mg via ORAL

## 2014-11-01 MED ORDER — MIDAZOLAM HCL 2 MG/2ML IJ SOLN
INTRAMUSCULAR | Status: AC
  Filled 2014-11-01: qty 2

## 2014-11-01 MED ORDER — LIDOCAINE HCL (PF) 1 % IJ SOLN
INTRAMUSCULAR | Status: AC
  Filled 2014-11-01: qty 30

## 2014-11-01 MED ORDER — SODIUM CHLORIDE 0.9 % IV SOLN: 250.0000 mL | INTRAVENOUS | Status: DC | PRN

## 2014-11-01 NOTE — H&P (View-Only) (Signed)
[facility]   History of Present Illness: 74 yo female with history of aortic stenosis, chronic atrial fibrillation on Pradaxa, COPD/bronchitis, pulmonary HTN here today for cardiac followup. I saw her as a new patient for evaluation of aortic valve stenosis 05/12/14 and consideration for possible transcatheter aortic valve replacement. She is known to have severe aortic valve stenosis. Echo 04/19/14 with normal LV size and systolic function, LVEF=60-65%. There is severe aortic valve stenosis with mean gradient of 56 mm Hg, peak gradient 96 mm Hg. AVA 0.71cm2. Mild to moderate MR. PA pressure estimated at 76 mm Hg.  I saw her in September 2015 and she had no complaints at that time. Her dyspnea seemed to be related to episodes of bronchitis. Of note, there is a family history of malignant hyperthermia with deaths of 2 family members during surgery.   She is here today for follow up. he works full days and is not limited by any chest pain or SOB. She notes dyspnea when her "bronchitis is acting up". She has no exertional dyspnea at this time although she admits that she does have dyspnea when there is pollen in the air. No exertional chest pain. She states that she has been battling bronchitis/asthma for years. She has never been a smoker. She has chronic lower extemity edema and uses Lasix daily. She is limited in strenuous activities by arthritis and pain in her knees. She has had no syncopal events.   Primary Care Physician: [name] Referring Physician: Dr. Nona Dell   Past Medical History  Diagnosis Date  . Arthritis   . History of pneumonia   . COPD (chronic obstructive pulmonary disease)   . Chronic atrial fibrillation   . Aortic stenosis     Severe by echo  August 2015    Past Surgical History  Procedure Laterality Date  . None      Current Outpatient Prescriptions  Medication Sig Dispense Refill  . albuterol (PROVENTIL) (2.5 MG/3ML) 0.083% nebulizer solution Take 3 mLs (2.5 mg total) by nebulization every 4 (four) hours as needed for wheezing or shortness of breath. 75 mL 12  . ALPHA LIPOIC ACID PO Take 1 capsule by mouth daily.    . Ascorbic Acid (VITAMIN C PO) Take 2 tablets  by mouth daily.    . B Complex Vitamins (B COMPLEX PO) Take 1 tablet by mouth daily.     . dabigatran (PRADAXA) 150 MG CAPS capsule TAKE ONE CAPSULE BY MOUTH EVERY 12 HOURS 60 capsule 6  . folic acid (FOLVITE) 1 MG tablet Take 1 mg by mouth daily.    . furosemide (LASIX) 20 MG tablet Take 1 tablet (20 mg total) by mouth daily. 30 tablet 0  . GINKGO BILOBA PO Take 2 tablets by mouth daily.    . Ginseng (GIN-ZING PO) Take 1 tablet by mouth 2 (two) times daily.     Chilton Si. Green Tea, Camillia sinensis, (GREEN TEA PO) Take 1 tablet by mouth daily.    Marland Kitchen. MAGNESIUM PO Take 2 tablets by mouth daily.    . Methylsulfonylmethane (MSM PO) Take 3 tablets by mouth daily.    . Potassium 99 MG TABS Take 99 mg by mouth daily.    . traMADol (ULTRAM) 50 MG tablet Take 50 mg by mouth every 6 (six) hours as needed.     Marland Kitchen. VITAMIN A PO Take 1 tablet by mouth daily.    Marland Kitchen. VITAMIN E PO Take 1 tablet by mouth daily.     No current facility-administered medications for this visit.    Allergies  Allergen Reactions  . Penicillins Other (See Comments)    Unknown  . Latex Rash    History   Social History  . Marital Status: Divorced    Spouse Name: N/A    Number of Children: 0  . Years of Education: N/A   Occupational History  . Hairdresser    Social History Main Topics  . Smoking status: Never Smoker   . Smokeless tobacco: Not on file  . Alcohol Use: No  . Drug Use: No  . Sexual Activity: No   Other Topics Concern  . Not on file   Social History Narrative    Family History   Problem Relation Age of Onset  . Breast cancer Mother   . Congestive Heart Failure Mother   . Cancer Father     ? Lymphoma  . Malignant hyperthermia Cousin     Review of Systems:  As stated in the HPI and otherwise negative.   BP 130/64 mmHg  Pulse 83  Ht 5\' 2"  (1.575 m)  Wt 241 lb 9.6 oz (109.589 kg)  BMI 44.18 kg/m2  SpO2 95%  Physical Examination: General: Well developed, well nourished, NAD HEENT: OP clear, mucus membranes moist SKIN: warm, dry. No rashes. Neuro: No focal deficits Musculoskeletal: Muscle strength 5/5 all ext Psychiatric: Mood and affect normal Neck: No JVD, no carotid bruits, no thyromegaly, no lymphadenopathy. Lungs:Clear bilaterally, no wheezes, rhonci, crackles Cardiovascular: Irregular irregular. Loud harsh systolic murmur. No gallops or rubs. Abdomen:Soft. Bowel sounds present. Non-tender.  Extremities: Trace bilateral lower extremity edema. Pulses are 2 + in the bilateral DP/PT.   Echo 08/30/14: Left ventricle: The cavity size was normal. Wall thickness was normal. Systolic function was normal. The estimated ejection fraction was in the range of 55% to 60%. - Aortic valve: AV is thickened, calcified with restricted motion. Peak nad mean gradients through the valve are 75 and 48 mm Hg respectively consistent with severe to critical AS> There was trivial regurgitation. - Mitral valve: There was mild regurgitation. - Left atrium: The atrium was moderately to severely dilated. - Right ventricle: The cavity size was mildly dilated. Systolic function was mildly reduced. - Right atrium: The atrium was moderately dilated. -  Pulmonary arteries: PA peak pressure: 62 mm Hg (S).  STS Risk Calculator:  Risk of Mortality: 2.523%  Morbidity or Mortality: 14.84%  Long Length of Stay: 7.897%  Short Length of Stay: 31.51%  Permanent Stroke: 0.987%  Prolonged Ventilation: 10.721%  DSW Infection: 0.733%    Assessment and Plan: 74 yo female  with severe aortic valve stenosis, NYHA class 2 symptoms with comorbidities including COPD, Pulmonary HTN here today for follow up discussion regarding her AS. I saw her in September 2015 and she was not ready to discuss valve replacement.    1. Severe aortic valve stenosis: She has severe aortic valve stenosis with mean gradient of 56 mm Hg by echo August 2015, 48 mmHg by echo December 2015. She describes NYHA class 2 symptoms with dyspnea with mild to moderate exertion, although limited by knee pain. She has no angina or syncope. She has had a recent bout of pneumonia/COPD exacerbation. She has stable diastolic CHF. She has preferred a conservative approach to management of her aortic stenosis but at this time is willing to pursue therapeutic options. I have reviewed in detail today the etiology of aortic stenosis including natural progression and symptoms. I have reviewed her echo images which demonstrate severe AS as described with a calcified tri-leaflet valve. She will be high risk for traditional AVR with obesity, immobility due to chronic knee pain and severe COPD although her STS risk score suggests mortality of 2.5% with traditional open AVR. This is calculated before her cardiac cath is performed. She is not known to have CAD. I have reviewed the TAVR procedure in detail. I will have her seen in CT surgery to discuss her treatment options. She will need to be set up for a cardiac cath also. This will be arranged over the next few weeks. I will wait to order her CTA abd/pelvis,chest and Cardiac CT after her visit in the CT surgery office.     2. Pulmonary HTN: PA pressure estimated at 62 mm Hg by echo December 2015.   3. COPD: PFTs done in March of this year with severe obstructive defect.   4. Chronic atrial fibrillation: Rate controlled. She is anti-coagulated with Pradaxa.   Addendum 10/19/14: Right and left heart cath on 11/01/14. Will then refer to see CT surgery

## 2014-11-01 NOTE — Interval H&P Note (Signed)
History and Physical Interval Note:  11/01/2014 2:14 PM  Sandra Turner  has presented today for cardiac cath with the diagnosis of aortic stenosis.  The various methods of treatment have been discussed with the patient and family. After consideration of risks, benefits and other options for treatment, the patient has consented to  Procedure(s): LEFT AND RIGHT HEART CATHETERIZATION WITH CORONARY ANGIOGRAM (N/A) as a surgical intervention .  The patient's history has been reviewed, patient examined, no change in status, stable for surgery.  I have reviewed the patient's chart and labs.  Questions were answered to the patient's satisfaction.      [name]

## 2014-11-01 NOTE — CV Procedure (Addendum)
      Cardiac Catheterization Operative Report  Sandra AdasRita Whittier [id number] 2/29/20162:16 PM Ernestine ConradBLUTH, [name], MD  Procedure Performed:  1. Left Heart Catheterization 2. Selective Coronary Angiography 3. Right Heart Catheterization  Operator: Verne Carrowhristopher Riane Rung, MD  Indication:  Severe aortic valve stenosis                               Procedure Details: The risks, benefits, complications, treatment options, and expected outcomes were discussed with the patient. The patient and/or family concurred with the proposed plan, giving informed consent. The patient was brought to the cath lab after IV hydration was begun and oral premedication was given. The patient was further sedated with Versed and Fentanyl. The right groin was prepped and draped in the usual manner. Using the modified Seldinger access technique, a 5 French sheath was placed in the right femoral artery. A 7 French sheath was placed in the right femoral vein. A balloon tipped catheter was used to perform a right heart catheterization. Standard diagnostic catheters were used to perform selective coronary angiography. I crossed the aortic valve with the JR4 catheter and a straight wire. LV pressures were measured. No LV gram was performed. There were no immediate complications. The patient was taken to the recovery area in stable condition.   Hemodynamic Findings: Ao:  134/67             LV: 177/13/18 RA: 13            RV: 59/2/12 PA: 63/23 (mean 39)    PCWP: 25 Fick Cardiac Output: 6.2 L/min Fick Cardiac Index: 3.0 L/min/m2 Central Aortic Saturation: 93% Pulmonary Artery Saturation: 66%  Aortic valve data:  Peak to peak gradient: 43 mm Hg Mean gradient: 29.3 mmHg AVA 1.1 cm2  Angiographic Findings:  Left main: No obstructive disease.   Left Anterior Descending Artery: Large caliber vessel that courses to the apex. Moderate caliber diagonal branch. Several small caliber distal diagonal branches. No obstructive disease.     Circumflex Artery: Large caliber vessel with a small early obtuse marginal branch and two moderate caliber obtuse marginal branches. No obstructive disease.   Right Coronary Artery: Large dominant vessel with no obstructive disease.   Left Ventricular Angiogram: Deferred.   Impression: 1. No angiographic evidence of CAD 2. Severe aortic valve stenosis   Recommendations: Will refer her to see Dr. Laneta SimmersBartle or Dr. Cornelius Moraswen to discuss AVR. If she is not felt to be a good candidate for traditional AVR, will consider TAVR. Resume Pradaxa tomorrow morning.        Complications:  None; patient tolerated the procedure well.

## 2014-11-01 NOTE — Brief Op Note (Signed)
Right Femoral arterial and venous sheaths removed without complication. Manual compression obtain to achieve hemostases. Total hold time 25 minutes. Bilateral distal pulses palpable +2. Sterile dressing applied, Dressing dry and intact. Patient understands instructions and recovery.

## 2014-11-01 NOTE — Discharge Instructions (Signed)
Will increase Lasix to 40 mg daily. Prescription given.  She should restart Pradaxa tomorrow morning if no bleeding at her cath site.   Angiogram, Care After Refer to this sheet in the next few weeks. These instructions provide you with information on caring for yourself after your procedure. Your health care provider may also give you more specific instructions. Your treatment has been planned according to current medical practices, but problems sometimes occur. Call your health care provider if you have any problems or questions after your procedure.  WHAT TO EXPECT AFTER THE PROCEDURE After your procedure, it is typical to have the following sensations:  Minor discomfort or tenderness and a small bump at the catheter insertion site. The bump should usually decrease in size and tenderness within 1 to 2 weeks.  Any bruising will usually fade within 2 to 4 weeks. HOME CARE INSTRUCTIONS   You may need to keep taking blood thinners if they were prescribed for you. Take medicines only as directed by your health care provider.  Do not apply powder or lotion to the site.  Do not take baths, swim, or use a hot tub until your health care provider approves.  You may shower 24 hours after the procedure. Remove the bandage (dressing) and gently wash the site with plain soap and water. Gently pat the site dry.  Inspect the site at least twice daily.  Limit your activity for the first 48 hours. Do not bend, squat, or lift anything over 20 lb (9 kg) or as directed by your health care provider.  Plan to have someone take you home after the procedure. Follow instructions about when you can drive or return to work. SEEK MEDICAL CARE IF:  You get light-headed when standing up.  You have drainage (other than a small amount of blood on the dressing).  You have chills.  You have a fever.  You have redness, warmth, swelling, or pain at the insertion site. SEEK IMMEDIATE MEDICAL CARE IF:   You  develop chest pain or shortness of breath, feel faint, or pass out.  You have bleeding, swelling larger than a walnut, or drainage from the catheter insertion site.  You develop pain, discoloration, coldness, or severe bruising in the leg or arm that held the catheter.  You develop bleeding from any other place, such as the bowels. You may see bright red blood in your urine or stools, or your stools may appear black and tarry.  You have heavy bleeding from the site. If this happens, hold pressure on the site. MAKE SURE YOU:  Understand these instructions.  Will watch your condition.  Will get help right away if you are not doing well or get worse. Document Released: 03/08/2005 Document Revised: 01/04/2014 Document Reviewed: 01/12/2013 [facility] Patient Information 2015 [place], [place]. This information is not intended to replace advice given to you by your health care provider. Make sure you discuss any questions you have with your health care provider.

## 2014-11-03 LAB — POCT I-STAT 3, ART BLOOD GAS (G3+)
Bicarbonate: 25.1 mEq/L — ABNORMAL HIGH (ref 20.0–24.0)
O2 Saturation: 93 %
PCO2 ART: 40.2 mmHg (ref 35.0–45.0)
PH ART: 7.403 (ref 7.350–7.450)
TCO2: 26 mmol/L (ref 0–100)
pO2, Arterial: 66 mmHg — ABNORMAL LOW (ref 80.0–100.0)

## 2014-11-03 LAB — POCT I-STAT 3, VENOUS BLOOD GAS (G3P V)
BICARBONATE: 25.6 meq/L — AB (ref 20.0–24.0)
O2 SAT: 66 %
TCO2: 27 mmol/L (ref 0–100)
pCO2, Ven: 44.3 mmHg — ABNORMAL LOW (ref 45.0–50.0)
pH, Ven: 7.37 — ABNORMAL HIGH (ref 7.250–7.300)
pO2, Ven: 36 mmHg (ref 30.0–45.0)

## 2014-11-10 ENCOUNTER — Encounter: Payer: Medicare Other | Admitting: Surgery

## 2014-11-15 ENCOUNTER — Encounter: Payer: Medicare Other | Admitting: Surgery

## 2014-11-22 ENCOUNTER — Encounter: Payer: Self-pay | Admitting: Thoracic Surgery (Cardiothoracic Vascular Surgery)

## 2014-11-22 ENCOUNTER — Encounter: Payer: Medicare Other | Admitting: Surgery

## 2014-11-22 ENCOUNTER — Institutional Professional Consult (permissible substitution) (INDEPENDENT_AMBULATORY_CARE_PROVIDER_SITE_OTHER): Payer: Medicare Other | Admitting: Thoracic Surgery (Cardiothoracic Vascular Surgery)

## 2014-11-22 VITALS — BP 156/89 | HR 60 | Resp 16 | Ht 62.0 in | Wt 240.0 lb

## 2014-11-22 DIAGNOSIS — I482 Chronic atrial fibrillation, unspecified: Secondary | ICD-10-CM

## 2014-11-22 DIAGNOSIS — I350 Nonrheumatic aortic (valve) stenosis: Secondary | ICD-10-CM | POA: Insufficient documentation

## 2014-11-22 NOTE — Patient Instructions (Signed)
Continue all current medications without change  Call your cardiologist (Dr Diona BrownerMcDowell) with any change or development of symptoms of shortness of breath, chest pain, or dizzy spells

## 2014-11-22 NOTE — Progress Notes (Signed)
[facility]  MULTIDISCIPLINARY HEART VALVE CLINIC    CARDIOTHORACIC SURGERY CONSULTATION REPORT  Referring Provider is Sandra Turner* MD Primary Cardiologist is Sandra Sidle, MD PCP is Sandra Conrad, MD  Chief Complaint  Patient presents with  . Aortic Stenosis    HPI:  Patient is a 75 year old obese white female from [place] referred to discuss treatment options for management of severe symptomatic aortic stenosis.  The patient's cardiac history dates back to December 2013 when she was hospitalized acutely at [facility] with acute exacerbation of shortness of breath. She was found to be in atrial fibrillation and transthoracic echocardiogram demonstrated what was felt to be moderate aortic stenosis with signs of pulmonary hypertension. Left ventricular systolic function was normal. She was initially evaluated by Dr. Dietrich Turner. The patient recalls that she was told she probably had pneumonia at that time. She refused anticoagulation using warfarin at that time.  She has been followed ever since through the [facility] office in [place], most recently by Dr. Diona Turner. She has remained in atrial fibrillation, and recently she has been chronically anticoagulated using Pradaxa. Follow-up echocardiograms have demonstrated progression of the severity of aortic stenosis.  Echocardiogram performed 04/19/2014 revealed normal left ventricular systolic function with ejection fraction estimated 60-65% and severe aortic stenosis with mean transvalvular gradient estimated 56 mmHg.  She was referred to the multidisciplinary heart valve clinic and was seen in consultation by Dr. Clifton Turner on 05/12/2014.  At that time the patient was reluctant to proceed with any further diagnostic evaluation and plans were made for close follow-up on medical therapy.  Follow-up echocardiogram performed 08/30/2014 revealed preserved left ventricular systolic function with ejection  fraction estimated 55-60%. There remained severe to "critical" aortic stenosis with mean velocity across the aortic valve estimated 48 mmHg.  The patient was hospitalized for several days in early January with progressive shortness of breath, low-grade fever, and productive cough. She was treated for presumed acute bronchitis and COPD exacerbation, and chest radiographs suggests the possibility of community acquired pneumonia.  Symptoms reportedly improve with antibiotics.  She was seen in follow-up in early February by Dr. Clifton Turner and agreed to proceed with left and right heart catheterization to further evaluate the severity of aortic stenosis. Catheterization confirmed the presence of aortic stenosis with peak-to-peak and mean transvalvular gradients measured 43 and 29 mmHg, respectively. The patient did not have any significant coronary artery disease.  There was mild to moderate pulmonary hypertension. The patient has been referred for surgical consultation to discuss treatment options further.  The patient is single and lives alone in [place]. 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 She is divorced but has no children. She has several siblings who live in [place].  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 She describes stable symptoms of exertional shortness of breath. She states that her physical mobility is limited more by arthritis in her knees and her shortness of breath, and typically her breathing reportedly does not bother her with ordinary day-to-day activities. She denies any history of chest pain or chest tightness either with activity or at rest. She has not had palpitations, dizzy spells, nor syncope. She denies any history of resting shortness of breath, PND,  orthopnea.  The patient has never been a smoker and she  denies any significant second hand smoke exposure. She has been treated for chronic relapsing bronchitis and told that she has COPD.  The patient reports a family history of malignant hyperthermia with general anesthesia.  Past Medical History  Diagnosis Date  . Arthritis   . History of pneumonia   . COPD (chronic obstructive pulmonary disease)   . Chronic atrial fibrillation   . Aortic stenosis     Severe by echo August 2015    Past Surgical History  Procedure Laterality Date  . None    . Left and right heart catheterization with coronary angiogram N/A 11/01/2014    Procedure: LEFT AND RIGHT HEART CATHETERIZATION WITH CORONARY ANGIOGRAM;  Surgeon: Sandra Hazel, MD;  Location: [facility] CATH LAB;  Service: Cardiovascular;  Laterality: N/A;    Family History  Problem Relation Age of Onset  . Breast cancer Mother   . Congestive Heart Failure Mother   . Cancer Father     ? Lymphoma  . Malignant hyperthermia Cousin     History   Social History  . Marital Status: Divorced    Spouse Name: N/A  . Number of Children: 0  . Years of Education: N/A   Occupational History  . Hairdresser    Social History Main Topics  . Smoking status: Never Smoker   . Smokeless tobacco: Not on file  . Alcohol Use: No  . Drug Use: No  . Sexual Activity: No   Other Topics Concern  . Not on file   Social History Narrative    Current Outpatient Prescriptions  Medication Sig Dispense Refill  . albuterol (PROVENTIL) (2.5 MG/3ML) 0.083% nebulizer solution Take 3 mLs (2.5 mg total) by nebulization every 4 (four) hours as needed for wheezing or shortness of breath. 75 mL 12  . ALPHA LIPOIC ACID PO Take 1 capsule by mouth 2 (two) times daily.     . Ascorbic Acid (VITAMIN C PO) Take 1 tablet by mouth 2 (two) times daily.     . B Complex Vitamins (B COMPLEX PO) Take 1 tablet by mouth 2 (two) times daily.     . dabigatran (PRADAXA) 150 MG CAPS  capsule TAKE ONE CAPSULE BY MOUTH EVERY 12 HOURS (Patient taking differently: Take 150 mg by mouth 2 (two) times daily. TAKE ONE CAPSULE BY MOUTH EVERY 12 HOURS) 60 capsule 6  . FOLIC ACID PO Take 1 tablet by mouth 2 (two) times daily.     . furosemide (LASIX) 20 MG tablet Take 1 tablet (20 mg total) by mouth daily. 30 tablet 0  . GINKGO BILOBA PO Take 1 tablet by mouth 2 (two) times daily.     . Ginseng (GIN-ZING PO) Take 1 tablet by mouth 2 (two) times daily.     Chilton Si Tea, Camillia sinensis, (GREEN TEA PO) Take 1 tablet by mouth 2 (two) times daily.     Marland Kitchen MAGNESIUM PO Take 1 tablet by mouth 2 (two) times daily.     . Methylsulfonylmethane (MSM PO) Take 2 tablets by mouth 2 (two) times daily.     . Nutritional Supplements (DHEA PO) Take 1 capsule by mouth 2 (two) times daily.    . Omega-3 Fatty Acids (FISH OIL PO) Take 2 capsules by mouth 2 (two) times daily.    Marland Kitchen OVER THE COUNTER MEDICATION Take 1 tablet by mouth 2 (two) times daily. zinc    . Potassium 99 MG TABS Take 99 mg by mouth daily.    . traMADol (ULTRAM) 50  MG tablet Take 50 mg by mouth every 6 (six) hours as needed (pain).     Marland Kitchen VITAMIN A PO Take 1 tablet by mouth 2 (two) times daily.     Marland Kitchen VITAMIN E PO Take 1 tablet by mouth 2 (two) times daily.      No current facility-administered medications for this visit.    Allergies  Allergen Reactions  . Bee Venom Swelling  . Latex Rash  . Penicillins Rash      Review of Systems:   General:  normal appetite, normal energy, no weight gain, no weight loss, no fever  Cardiac:  no chest pain with exertion, no chest pain at rest, + SOB with exertion, no resting SOB, no PND, no orthopnea, no palpitations, no arrhythmia, no atrial fibrillation, no LE edema, no dizzy spells, no syncope  Respiratory:  + exertional shortness of breath, no home oxygen, no productive cough, no dry cough, no bronchitis, no wheezing, no hemoptysis, no asthma, no pain with inspiration or cough, no sleep  apnea, no CPAP at night  GI:   no difficulty swallowing, no reflux, no frequent heartburn, no hiatal hernia, no abdominal pain, no constipation, no diarrhea, no hematochezia, no hematemesis, no melena  GU:   no dysuria,  no frequency, no urinary tract infection, no hematuria, no kidney stones, no kidney disease  Vascular:  no pain suggestive of claudication, no pain in feet, no leg cramps, no varicose veins, no DVT, no non-healing foot ulcer  Neuro:   no stroke, no TIA's, no seizures, no headaches, no temporary blindness one eye,  no slurred speech, no peripheral neuropathy, no chronic pain, no instability of gait, no memory/cognitive dysfunction  Musculoskeletal: + arthritis, no joint swelling, no myalgias, mild difficulty walking, mildly limited mobility   Skin:   no rash, no itching, no skin infections, no pressure sores or ulcerations  Psych:   no anxiety, no depression, no nervousness, no unusual recent stress  Eyes:   no blurry vision, no floaters, no recent vision changes, does not wears glasses or contacts  ENT:   no hearing loss, no loose or painful teeth, no dentures, last saw dentist within the past year  Hematologic:  no easy bruising, no abnormal bleeding, no clotting disorder, no frequent epistaxis  Endocrine:  no diabetes, does not check CBG's at home           Physical Exam:   BP 156/89 mmHg  Pulse 60  Resp 16  Ht 5\' 2"  (1.575 m)  Wt 240 lb (108.863 kg)  BMI 43.89 kg/m2  SpO2 93%  General:  Obese but o/w  well-appearing  HEENT:  Unremarkable   Neck:   no JVD, no bruits, no adenopathy   Chest:   clear to auscultation, symmetrical breath sounds, no wheezes, no rhonchi   CV:   Irregular rate and rhythm, grade IV/VI crescendo/decrescendo murmur heard best at RUSB,  no diastolic murmur  Abdomen:  soft, non-tender, no masses   Extremities:  warm, well-perfused, pulses diminished but palpable, no LE edema  Rectal/GU  Deferred  Neuro:   Grossly non-focal and symmetrical  throughout  Skin:   Clean and dry, no rashes, no breakdown   Diagnostic Tests:  Transthoracic Echocardiography  Patient:  Jaanvi, Fizer MR #:    [id number] Study Date: 08/30/2014 Gender:   F Age:    73 Height:   160 cm Weight:   112.9 kg BSA:    2.31 m^2 Pt. Status: Room:  ATTENDING  Remi Deter  Sandra BrownerMcDowell, M.D. ORDERING   Nona DellSamuel McDowell, M.D. REFERRING  Nona DellSamuel McDowell, M.D. PERFORMING  [facility], Jeani HawkingAnnie Penn SONOGRAPHER Leta Junglingiffany Cooper, RDCS  cc:  ------------------------------------------------------------------- LV EF: 55% -  60%  ------------------------------------------------------------------- Indications:   Aortic stenosis 424.1.  ------------------------------------------------------------------- History:  PMH:  Atrial fibrillation. Primary pulmonary hypertension.  ------------------------------------------------------------------- Study Conclusions  - Left ventricle: The cavity size was normal. Wall thickness was normal. Systolic function was normal. The estimated ejection fraction was in the range of 55% to 60%. - Aortic valve: AV is thickened, calcified with restricted motion. Peak nad mean gradients through the valve are 75 and 48 mm Hg respectively consistent with severe to critical AS> There was trivial regurgitation. - Mitral valve: There was mild regurgitation. - Left atrium: The atrium was moderately to severely dilated. - Right ventricle: The cavity size was mildly dilated. Systolic function was mildly reduced. - Right atrium: The atrium was moderately dilated. - Pulmonary arteries: PA peak pressure: 62 mm Hg (S).  Transthoracic echocardiography. M-mode, complete 2D, spectral Doppler, and color Doppler. Birthdate: Patient birthdate: 08-22-1941. Age: Patient is 74 yr old. Sex: Gender: female. BMI: 44.1 kg/m^2. Blood pressure:   128/64 Patient status: Inpatient. Study date: Study date:  08/30/2014. Study time: 03:46 PM. Location: Echo laboratory.  -------------------------------------------------------------------  ------------------------------------------------------------------- Left ventricle: The cavity size was normal. Wall thickness was normal. Systolic function was normal. The estimated ejection fraction was in the range of 55% to 60%.  ------------------------------------------------------------------- Aortic valve: AV is thickened, calcified with restricted motion. Peak nad mean gradients through the valve are 75 and 48 mm Hg respectively consistent with severe to critical AS> Doppler: There was trivial regurgitation.  VTI ratio of LVOT to aortic valve: 0.28. Valve area (VTI): 0.87 cm^2. Indexed valve area (VTI): 0.38 cm^2/m^2. Valve area (Vmax): 0.91 cm^2. Indexed valve area (Vmax): 0.39 cm^2/m^2. Mean velocity ratio of LVOT to aortic valve: 0.25. Valve area (Vmean): 0.79 cm^2. Indexed valve area (Vmean): 0.34 cm^2/m^2.  Mean gradient (S): 48 mm Hg. Peak gradient (S): 75 mm Hg.  ------------------------------------------------------------------- Mitral valve:  Calcified annulus. Mildly thickened leaflets . Doppler: There was mild regurgitation.  Peak gradient (D): 10 mm Hg.  ------------------------------------------------------------------- Left atrium: The atrium was moderately to severely dilated.  ------------------------------------------------------------------- Right ventricle: The cavity size was mildly dilated. Systolic function was mildly reduced.  ------------------------------------------------------------------- Pulmonic valve:  Structurally normal valve.  Cusp separation was normal. Doppler: Transvalvular velocity was within the normal range. There was trivial regurgitation.  ------------------------------------------------------------------- Tricuspid valve:  Structurally normal valve.  Leaflet separation was  normal. Doppler: Transvalvular velocity was within the normal range. There was mild regurgitation.  ------------------------------------------------------------------- Right atrium: The atrium was moderately dilated.  ------------------------------------------------------------------- Pericardium: There was no pericardial effusion.  ------------------------------------------------------------------- Systemic veins: Inferior vena cava: The vessel was dilated. The respirophasic diameter changes were blunted (< 50%), consistent with elevated central venous pressure.  ------------------------------------------------------------------- Measurements  Left ventricle              Value     Reference LV ID, ED, PLAX chordal          51.9 mm    43 - 52 LV ID, ES, PLAX chordal          37.6 mm    23 - 38 LV fx shortening, PLAX chordal  (L)   28  %    >=29 LV PW thickness, ED            12.4 mm    --------- IVS/LV PW ratio, ED  0.87      <=1.3 Stroke volume, 2D             96  ml    --------- Stroke volume/bsa, 2D           42  ml/m^2  --------- LV e&', lateral              6.92 cm/s   --------- LV E/e&', lateral             22.69     --------- LV e&', medial               5.41 cm/s   --------- LV E/e&', medial              29.02     --------- LV e&', average              6.17 cm/s   --------- LV E/e&', average             25.47     ---------  Ventricular septum            Value     Reference IVS thickness, ED             10.8 mm    ---------  LVOT                   Value     Reference LVOT ID, S                20  mm    --------- LVOT area                  3.14 cm^2   --------- LVOT peak velocity, S           126  cm/s   --------- LVOT mean velocity, S           83.4 cm/s   --------- LVOT VTI, S                30.7 cm    --------- LVOT peak gradient, S           6   mm Hg  ---------  Aortic valve               Value     Reference Aortic valve mean velocity, S       333  cm/s   --------- Aortic valve VTI, S            111  cm    --------- Aortic mean gradient, S          48  mm Hg  --------- Aortic peak gradient, S          75  mm Hg  --------- VTI ratio, LVOT/AV            0.28      --------- Aortic valve area, VTI          0.87 cm^2   --------- Aortic valve area/bsa, VTI        0.38 cm^2/m^2 --------- Aortic valve area, peak velocity     0.91 cm^2   --------- Aortic valve area/bsa, peak        0.39 cm^2/m^2 --------- velocity Velocity ratio, mean, LVOT/AV       0.25      --------- Aortic valve area, mean velocity     0.79 cm^2   --------- Aortic valve area/bsa, mean        0.34  cm^2/m^2 --------- velocity  Aorta                   Value     Reference Aortic root ID, ED            32  mm    ---------  Left atrium                Value     Reference LA ID, A-P, ES              49  mm    --------- LA ID/bsa, A-P              2.13 cm/m^2  <=2.2 LA volume, S               118  ml    --------- LA volume/bsa, S             51.2 ml/m^2  --------- LA volume, ES, 1-p A4C          124  ml    --------- LA volume/bsa, ES, 1-p A4C        53.8 ml/m^2  --------- LA volume, ES, 1-p A2C          107  ml    --------- LA volume/bsa, ES, 1-p  A2C        46.4 ml/m^2  ---------  Mitral valve               Value     Reference Mitral E-wave peak velocity        157  cm/s   --------- Mitral deceleration time         165  ms    150 - 230 Mitral peak gradient, D          10  mm Hg  ---------  Pulmonary arteries            Value     Reference PA pressure, S, DP        (H)   62  mm Hg  <=30  Tricuspid valve              Value     Reference Tricuspid regurg peak velocity      341  cm/s   --------- Tricuspid peak RV-RA gradient       47  mm Hg  ---------  Systemic veins              Value     Reference Estimated CVP               15  mm Hg  ---------  Right ventricle              Value     Reference RV pressure, S, DP        (H)   62  mm Hg  <=30 RV s&', lateral, S             11.4 cm/s   ---------  Legend: (L) and (H) mark values outside specified reference range.  ------------------------------------------------------------------- Prepared and Electronically Authenticated by  Sandra Turner, M.D. 2015-12-28T17:48:39    Cardiac Catheterization Operative Report  Sandra Turner [id number] 2/29/20162:16 PM Sandra Conrad, MD  Procedure Performed:  1. Left Heart Catheterization 2. Selective Coronary Angiography 3. Right Heart Catheterization  Operator: Verne Carrow, MD  Indication: Severe aortic valve stenosis   Procedure Details: The risks, benefits, complications, treatment options, and expected outcomes were discussed with the patient.  The patient and/or family concurred with the proposed plan, giving informed consent. The patient was brought to the cath lab after IV hydration was begun and oral premedication was given. The patient was further sedated  with Versed and Fentanyl. The right groin was prepped and draped in the usual manner. Using the modified Seldinger access technique, a 5 French sheath was placed in the right femoral artery. A 7 French sheath was placed in the right femoral vein. A balloon tipped catheter was used to perform a right heart catheterization. Standard diagnostic catheters were used to perform selective coronary angiography. I crossed the aortic valve with the JR4 catheter and a straight wire. LV pressures were measured. No LV gram was performed. There were no immediate complications. The patient was taken to the recovery area in stable condition.   Hemodynamic Findings: Ao: 134/67  LV: 177/13/18 RA: 13  RV: 59/2/12 PA: 63/23 (mean 39)  PCWP: 25 Fick Cardiac Output: 6.2 L/min Fick Cardiac Index: 3.0 L/min/m2 Central Aortic Saturation: 93% Pulmonary Artery Saturation: 66%  Aortic valve data:  Peak to peak gradient: 43 mm Hg Mean gradient: 29.3 mmHg AVA 1.1 cm2  Angiographic Findings:  Left main: No obstructive disease.   Left Anterior Descending Artery: Large caliber vessel that courses to the apex. Moderate caliber diagonal branch. Several small caliber distal diagonal branches. No obstructive disease.   Circumflex Artery: Large caliber vessel with a small early obtuse marginal branch and two moderate caliber obtuse marginal branches. No obstructive disease.   Right Coronary Artery: Large dominant vessel with no obstructive disease.   Left Ventricular Angiogram: Deferred.   Impression: 1. No angiographic evidence of CAD 2. Severe aortic valve stenosis   Recommendations: Will refer her to see Dr. Laneta Simmers or Dr. Cornelius Moras to discuss AVR. If she is not felt to be a good candidate for traditional AVR, will consider TAVR. Resume Pradaxa tomorrow morning.    Complications: None; patient tolerated the procedure well.      Pulmonary Function  Tests  Baseline      Post-bronchodilator  FVC  1.53 L  (56% predicted) FVC  1.69 L  (62% predicted) FEV1  1.04 L  (51% predicted) FEV1  1.13 L  (55% predicted) FEF25-75 0.64 L  (37% predicted) FEF25-75 0.84 L  (48% predicted)  TLC  4.00 L  (84% predicted) RV  2.39 L  (112% predicted) DLCO  58% predicted    STS Risk Calculator  Procedure    AVR  Risk of Mortality   3.0% Morbidity or Mortality  17.0% Prolonged LOS   8.0% Short LOS    30.4% Permanent Stroke   1.5% Prolonged Vent Support  12.8% DSW Infection    0.6% Renal Failure    4.1% Reoperation    6.3%     Impression:  Patient has stage D severe symptomatic aortic stenosis.  I have personally reviewed the patient's recent transthoracic echocardiogram and diagnostic catheterization.  Transthoracic echocardiogram confirms findings consistent with severe calcific aortic stenosis. There is severe thickening and calcification involving all 3 leaflets of the aortic valve. Peak velocity across the aortic valve measures anywhere between 4.8 and 5.0 m/s corresponding to mean transvalvular gradient approaching 50 mmHg, with hemodynamics further affected by the patient's underlying chronic persistent atrial fibrillation. Left ventricular systolic function remains preserved with ejection fraction estimated between 55 and 60%.  Cardiac catheterization is notable for the absence of significant coronary artery disease and mild to moderate pulmonary hypertension. CT angiogram of the chest performed in 2013  demonstrates normal size of the ascending thoracic aorta without significant calcification or plaque within the aortic root. Risks associated with conventional surgical aortic valve replacement would be mildly elevated because of the patient's age, obesity, and underlying chronic lung disease. However, risks associated with conventional surgery would not be prohibitive, and the patient might benefit from concomitant Maze  procedure.   Plan:  The patient was counseled at length regarding treatment alternatives for management of severe symptomatic aortic stenosis. Alternative approaches such as conventional aortic valve replacement, transcatheter aortic valve replacement, and continued medical therapy were compared and contrasted at length.  The risks associated with conventional surgical aortic valve replacement were been discussed in detail, as were expectations for post-operative convalescence. Long-term prognosis with medical therapy was discussed and placed into context with regards to the natural history of aortic stenosis and the patient's own specific clinical presentation and past medical history.  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 She desires to think matters over and look into options for management of her business before making a final decision about surgery. She will return to the office in 4 weeks to discuss matters further. All of her questions have been addressed.     Salvatore Decent. Cornelius Moras, MD 11/22/2014 1:12 PM

## 2014-12-02 ENCOUNTER — Telehealth: Payer: Self-pay | Admitting: Cardiology

## 2014-12-03 NOTE — Telephone Encounter (Signed)
PA submitted via CoverMyMeds on 12/03/14

## 2014-12-27 ENCOUNTER — Ambulatory Visit (INDEPENDENT_AMBULATORY_CARE_PROVIDER_SITE_OTHER): Payer: Medicare Other | Admitting: Thoracic Surgery (Cardiothoracic Vascular Surgery)

## 2014-12-27 ENCOUNTER — Other Ambulatory Visit: Payer: Self-pay | Admitting: *Deleted

## 2014-12-27 ENCOUNTER — Encounter: Payer: Self-pay | Admitting: Thoracic Surgery (Cardiothoracic Vascular Surgery)

## 2014-12-27 VITALS — BP 149/66 | HR 68 | Resp 18 | Ht 64.0 in | Wt 231.0 lb

## 2014-12-27 DIAGNOSIS — I482 Chronic atrial fibrillation, unspecified: Secondary | ICD-10-CM

## 2014-12-27 DIAGNOSIS — I4891 Unspecified atrial fibrillation: Secondary | ICD-10-CM

## 2014-12-27 DIAGNOSIS — I350 Nonrheumatic aortic (valve) stenosis: Secondary | ICD-10-CM

## 2014-12-27 NOTE — Patient Instructions (Addendum)
Patient has been instructed to stop taking Pradaxa and fish oil capsules on Wednesday May 11th - 1 week before your surgery  Patient should continue taking all other medications without change through the day before surgery.  Patient should have nothing to eat or drink after midnight the night before surgery.  On the morning of surgery patient should not take any medications prior to coming to the hospital.

## 2014-12-27 NOTE — Progress Notes (Signed)
[address]       [place] [address]             [phone]     CARDIOTHORACIC SURGERY OFFICE NOTE  Referring Provider is [name]*, MD Primary Cardiologist is [name], MD PCP is [name], MD   HPI:  Patient returns to the office today for follow-up of stage D severe symptomatic aortic stenosis. She was originally seen in consultation on 11/22/2014. Risks associated with conventional surgical aortic valve replacement would likely be mildly elevated because of the patient's associated comorbid medical problems including obesity and chronic lung disease. 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 She reports no new problems over the past few weeks. She describes stable symptoms of exertional shortness of breath that occur only with moderately strenuous activity. With ordinary daily activities she reports no significant limitations. She has not had any chest pain or chest tightness. She has never had any dizzy spells or syncope.   Current Outpatient Prescriptions  Medication Sig Dispense Refill  . albuterol (PROVENTIL) (2.5 MG/3ML) 0.083% nebulizer solution Take 3 mLs (2.5 mg total) by nebulization every 4 (four) hours as needed for wheezing or shortness of breath. 75 mL 12  . ALPHA LIPOIC ACID PO Take 1 capsule by mouth 2 (two) times daily.     . Ascorbic Acid (VITAMIN C PO) Take 1 tablet by mouth 2 (two) times daily.     . B Complex Vitamins (B COMPLEX PO) Take 1 tablet by mouth 2 (two) times daily.     . dabigatran (PRADAXA) 150 MG CAPS capsule TAKE ONE CAPSULE BY MOUTH EVERY 12 HOURS (Patient taking differently: Take 150 mg by mouth 2 (two) times daily. TAKE ONE CAPSULE BY MOUTH EVERY 12 HOURS) 60 capsule 6  . FOLIC ACID PO Take 1 tablet by  mouth 2 (two) times daily.     . furosemide (LASIX) 20 MG tablet Take 1 tablet (20 mg total) by mouth daily. 30 tablet 0  . GINKGO BILOBA PO Take 1 tablet by mouth 2 (two) times daily.     . Ginseng (GIN-ZING PO) Take 1 tablet by mouth 2 (two) times daily.     Chilton Si Tea, Camillia sinensis, (GREEN TEA PO) Take 1 tablet by mouth 2 (two) times daily.     Marland Kitchen MAGNESIUM PO Take 1 tablet by mouth 2 (two) times daily.     . Methylsulfonylmethane (MSM PO) Take 2 tablets by mouth 2 (two) times daily.     . Nutritional Supplements (DHEA PO) Take 1 capsule by mouth 2 (two) times daily.    . Omega-3 Fatty Acids (FISH OIL PO) Take 2 capsules by mouth 2 (two) times daily.    Marland Kitchen OVER THE COUNTER MEDICATION Take 1 tablet by mouth 2 (two) times daily. zinc    . Potassium 99 MG TABS Take 99 mg by mouth daily.    . traMADol (ULTRAM) 50 MG tablet Take 50 mg by mouth every 6 (six) hours as needed (pain).     Marland Kitchen VITAMIN A PO Take 1 tablet by mouth 2 (two) times daily.     Marland Kitchen VITAMIN E PO Take 1 tablet by mouth 2 (two) times daily.      No current facility-administered medications for this  visit.      Physical Exam:   BP 149/66 mmHg  Pulse 68  Resp 18  Ht 5\' 4"  (1.626 m)  Wt 231 lb (104.781 kg)  BMI 39.63 kg/m2  SpO2 97%  General:  Obese but well appearing  Chest:   Clear to auscultation  CV:   Regular rate and rhythm with systolic murmur  Incisions:  n/a  Abdomen:  Soft and nontender  Extremities:  Warm and well-perfused, no lower extremity edema  Diagnostic Tests:  n/a   Impression:  Patient has stage D severe symptomatic aortic stenosis. She has been followed for the last 3 years with aortic stenosis and atrial fibrillation.  She initially refused Coumadin therapy, but more recently she has been anticoagulated using Pradaxa.  She was hospitalized in early January of this year with progressive shortness of breath, low-grade fever, and a productive cough consistent with likely acute bronchitis and  COPD exacerbation with possible community-acquired pneumonia. Symptoms have improved since then and the patient has remained clinically stable with chronic exertional shortness of breath consistent with chronic diastolic congestive heart failure, currently New York Heart Association functional class II.  I have personally reviewed the patient's recent transthoracic echocardiogram and diagnostic catheterization. Transthoracic echocardiogram confirms findings consistent with severe calcific aortic stenosis. There is severe thickening and calcification involving all 3 leaflets of the aortic valve. Peak velocity across the aortic valve measures anywhere between 4.8 and 5.0 m/s corresponding to mean transvalvular gradient approaching 50 mmHg, with hemodynamics further affected by the patient's underlying chronic persistent atrial fibrillation. Left ventricular systolic function remains preserved with ejection fraction estimated between 55 and 60%. Cardiac catheterization is notable for the absence of significant coronary artery disease and mild to moderate pulmonary hypertension. CT angiogram of the chest performed in 2013 demonstrates normal size of the ascending thoracic aorta without significant calcification or plaque within the aortic root. Risks associated with conventional surgical aortic valve replacement would be mildly elevated because of the patient's age, obesity, and underlying chronic lung disease. However, risks associated with conventional surgery would not be prohibitive, and the patient might benefit from concomitant Maze procedure.    Plan:  I have again reviewed the indications, risks, potential benefits of aortic valve replacement and possible maze procedure with the patient in the office today.  She was counseled at length regarding surgical alternatives with respect to aortic valve replacement including continued medical therapy versus proceeding with conventional surgical aortic valve  replacement using either a mechanical prosthesis or a bioprosthetic tissue valve.  Discussion was held comparing the relative risks of mechanical valve replacement with need for lifelong anticoagulation versus use of a bioprosthetic tissue valve and the associated potential for late structural valve deterioration in failure.  This discussion was placed in the context of the patient's particular circumstances, and as a result the patient specifically requests that their valve be replaced using a bioprosthetic tissue valve.  The relative risks and benefits of performing a maze procedure at the time of their aortic valve surgery was discussed at length, including the expected likelihood of long term freedom from recurrent symptomatic atrial fibrillation and/or atrial flutter.  The patient desires to proceed with surgery the third week in May.  She understands and accepts all potential associated risks of surgery including but not limited to risk of death, stroke, myocardial infarction, congestive heart failure, respiratory failure, renal failure, pneumonia, bleeding requiring blood transfusion and or reexploration, arrhythmia, heart block or bradycardia requiring permanent pacemaker, aortic dissection or other  major vascular complication, pleural effusions or other delayed complications related to continued congestive heart failure, and other late complications related to valve replacement including structural valve deterioration and failure, thrombosis, endocarditis, or paravalvular leak.  We tentatively plan to proceed with surgery on Wednesday, 01/19/2015. The patient has been instructed to stop taking her Pradaxa and fish oil capsules one week before her surgery. She will return for follow-up on Monday, 01/17/2015 prior to surgery.   I spent in excess of 30 minutes during the conduct of this office consultation and >50% of this time involved direct face-to-face encounter with the patient for counseling and/or  coordination of their care.   Salvatore Decent. Cornelius Moras, MD 12/27/2014 1:15 PM

## 2015-01-13 ENCOUNTER — Other Ambulatory Visit: Payer: Self-pay | Admitting: *Deleted

## 2015-01-15 ENCOUNTER — Other Ambulatory Visit: Payer: Self-pay | Admitting: Thoracic Surgery (Cardiothoracic Vascular Surgery)

## 2015-01-15 DIAGNOSIS — I350 Nonrheumatic aortic (valve) stenosis: Secondary | ICD-10-CM

## 2015-01-17 ENCOUNTER — Ambulatory Visit (HOSPITAL_COMMUNITY)
Admission: RE | Admit: 2015-01-17 | Discharge: 2015-01-17 | Disposition: A | Discharge status: Home / Self Care | Payer: Medicare Other | Source: Ambulatory Visit | Attending: Thoracic Surgery (Cardiothoracic Vascular Surgery) | Admitting: Thoracic Surgery (Cardiothoracic Vascular Surgery)

## 2015-01-17 ENCOUNTER — Encounter (HOSPITAL_COMMUNITY)
Admission: RE | Admit: 2015-01-17 | Discharge: 2015-01-17 | Disposition: A | Discharge status: Home / Self Care | Payer: Medicare Other | Source: Ambulatory Visit | Attending: Thoracic Surgery (Cardiothoracic Vascular Surgery) | Admitting: Thoracic Surgery (Cardiothoracic Vascular Surgery)

## 2015-01-17 ENCOUNTER — Other Ambulatory Visit: Payer: Self-pay | Admitting: *Deleted

## 2015-01-17 ENCOUNTER — Encounter (HOSPITAL_COMMUNITY): Payer: Self-pay

## 2015-01-17 ENCOUNTER — Encounter: Payer: Self-pay | Admitting: Thoracic Surgery (Cardiothoracic Vascular Surgery)

## 2015-01-17 ENCOUNTER — Ambulatory Visit (INDEPENDENT_AMBULATORY_CARE_PROVIDER_SITE_OTHER): Payer: Medicare Other | Admitting: Thoracic Surgery (Cardiothoracic Vascular Surgery)

## 2015-01-17 ENCOUNTER — Inpatient Hospital Stay (HOSPITAL_COMMUNITY)
Admission: RE | Admit: 2015-01-17 | Discharge: 2015-02-21 | DRG: 219 | Disposition: A | Discharge status: Skilled Nursing Facility | Payer: Medicare Other | Source: Ambulatory Visit | Attending: Thoracic Surgery (Cardiothoracic Vascular Surgery) | Admitting: Thoracic Surgery (Cardiothoracic Vascular Surgery)

## 2015-01-17 VITALS — BP 129/59 | HR 71 | Temp 98.4°F | Resp 18 | Ht 62.0 in | Wt 237.7 lb

## 2015-01-17 VITALS — BP 136/77 | HR 76 | Resp 16 | Ht 62.0 in | Wt 247.0 lb

## 2015-01-17 DIAGNOSIS — Z978 Presence of other specified devices: Secondary | ICD-10-CM

## 2015-01-17 DIAGNOSIS — J9811 Atelectasis: Secondary | ICD-10-CM

## 2015-01-17 DIAGNOSIS — J679 Hypersensitivity pneumonitis due to unspecified organic dust: Secondary | ICD-10-CM | POA: Diagnosis not present

## 2015-01-17 DIAGNOSIS — J398 Other specified diseases of upper respiratory tract: Secondary | ICD-10-CM | POA: Diagnosis not present

## 2015-01-17 DIAGNOSIS — D72829 Elevated white blood cell count, unspecified: Secondary | ICD-10-CM | POA: Diagnosis not present

## 2015-01-17 DIAGNOSIS — J3801 Paralysis of vocal cords and larynx, unilateral: Secondary | ICD-10-CM | POA: Diagnosis not present

## 2015-01-17 DIAGNOSIS — R9431 Abnormal electrocardiogram [ECG] [EKG]: Secondary | ICD-10-CM | POA: Diagnosis not present

## 2015-01-17 DIAGNOSIS — G4733 Obstructive sleep apnea (adult) (pediatric): Secondary | ICD-10-CM | POA: Diagnosis present

## 2015-01-17 DIAGNOSIS — E871 Hypo-osmolality and hyponatremia: Secondary | ICD-10-CM | POA: Diagnosis not present

## 2015-01-17 DIAGNOSIS — J42 Unspecified chronic bronchitis: Secondary | ICD-10-CM | POA: Diagnosis not present

## 2015-01-17 DIAGNOSIS — I5033 Acute on chronic diastolic (congestive) heart failure: Secondary | ICD-10-CM | POA: Diagnosis not present

## 2015-01-17 DIAGNOSIS — J90 Pleural effusion, not elsewhere classified: Secondary | ICD-10-CM | POA: Diagnosis not present

## 2015-01-17 DIAGNOSIS — I4891 Unspecified atrial fibrillation: Secondary | ICD-10-CM | POA: Insufficient documentation

## 2015-01-17 DIAGNOSIS — Z7901 Long term (current) use of anticoagulants: Secondary | ICD-10-CM

## 2015-01-17 DIAGNOSIS — D6959 Other secondary thrombocytopenia: Secondary | ICD-10-CM | POA: Diagnosis not present

## 2015-01-17 DIAGNOSIS — J45901 Unspecified asthma with (acute) exacerbation: Secondary | ICD-10-CM | POA: Diagnosis not present

## 2015-01-17 DIAGNOSIS — I270 Primary pulmonary hypertension: Secondary | ICD-10-CM | POA: Diagnosis present

## 2015-01-17 DIAGNOSIS — J189 Pneumonia, unspecified organism: Secondary | ICD-10-CM

## 2015-01-17 DIAGNOSIS — D62 Acute posthemorrhagic anemia: Secondary | ICD-10-CM | POA: Diagnosis not present

## 2015-01-17 DIAGNOSIS — T17500A Unspecified foreign body in bronchus causing asphyxiation, initial encounter: Secondary | ICD-10-CM | POA: Diagnosis not present

## 2015-01-17 DIAGNOSIS — Z953 Presence of xenogenic heart valve: Secondary | ICD-10-CM

## 2015-01-17 DIAGNOSIS — J9600 Acute respiratory failure, unspecified whether with hypoxia or hypercapnia: Secondary | ICD-10-CM

## 2015-01-17 DIAGNOSIS — J438 Other emphysema: Secondary | ICD-10-CM | POA: Diagnosis not present

## 2015-01-17 DIAGNOSIS — M170 Bilateral primary osteoarthritis of knee: Secondary | ICD-10-CM | POA: Diagnosis present

## 2015-01-17 DIAGNOSIS — I340 Nonrheumatic mitral (valve) insufficiency: Secondary | ICD-10-CM | POA: Diagnosis present

## 2015-01-17 DIAGNOSIS — Z01810 Encounter for preprocedural cardiovascular examination: Secondary | ICD-10-CM | POA: Insufficient documentation

## 2015-01-17 DIAGNOSIS — J9621 Acute and chronic respiratory failure with hypoxia: Secondary | ICD-10-CM | POA: Diagnosis not present

## 2015-01-17 DIAGNOSIS — Z79899 Other long term (current) drug therapy: Secondary | ICD-10-CM | POA: Diagnosis not present

## 2015-01-17 DIAGNOSIS — Z8249 Family history of ischemic heart disease and other diseases of the circulatory system: Secondary | ICD-10-CM | POA: Diagnosis not present

## 2015-01-17 DIAGNOSIS — Z006 Encounter for examination for normal comparison and control in clinical research program: Secondary | ICD-10-CM

## 2015-01-17 DIAGNOSIS — I10 Essential (primary) hypertension: Secondary | ICD-10-CM | POA: Diagnosis present

## 2015-01-17 DIAGNOSIS — I6523 Occlusion and stenosis of bilateral carotid arteries: Secondary | ICD-10-CM | POA: Insufficient documentation

## 2015-01-17 DIAGNOSIS — I350 Nonrheumatic aortic (valve) stenosis: Secondary | ICD-10-CM

## 2015-01-17 DIAGNOSIS — E876 Hypokalemia: Secondary | ICD-10-CM | POA: Diagnosis not present

## 2015-01-17 DIAGNOSIS — R1310 Dysphagia, unspecified: Secondary | ICD-10-CM | POA: Diagnosis not present

## 2015-01-17 DIAGNOSIS — I509 Heart failure, unspecified: Secondary | ICD-10-CM | POA: Diagnosis not present

## 2015-01-17 DIAGNOSIS — Z77120 Contact with and (suspected) exposure to mold (toxic): Secondary | ICD-10-CM | POA: Diagnosis not present

## 2015-01-17 DIAGNOSIS — I482 Chronic atrial fibrillation, unspecified: Secondary | ICD-10-CM

## 2015-01-17 DIAGNOSIS — I481 Persistent atrial fibrillation: Secondary | ICD-10-CM | POA: Diagnosis present

## 2015-01-17 DIAGNOSIS — T380X5A Adverse effect of glucocorticoids and synthetic analogues, initial encounter: Secondary | ICD-10-CM | POA: Diagnosis not present

## 2015-01-17 DIAGNOSIS — T17590A Other foreign object in bronchus causing asphyxiation, initial encounter: Secondary | ICD-10-CM | POA: Diagnosis not present

## 2015-01-17 DIAGNOSIS — N170 Acute kidney failure with tubular necrosis: Secondary | ICD-10-CM | POA: Diagnosis not present

## 2015-01-17 DIAGNOSIS — L8990 Pressure ulcer of unspecified site, unspecified stage: Secondary | ICD-10-CM | POA: Diagnosis not present

## 2015-01-17 DIAGNOSIS — E873 Alkalosis: Secondary | ICD-10-CM | POA: Diagnosis not present

## 2015-01-17 DIAGNOSIS — Z954 Presence of other heart-valve replacement: Secondary | ICD-10-CM | POA: Diagnosis not present

## 2015-01-17 DIAGNOSIS — E870 Hyperosmolality and hypernatremia: Secondary | ICD-10-CM | POA: Diagnosis not present

## 2015-01-17 DIAGNOSIS — I272 Pulmonary hypertension, unspecified: Secondary | ICD-10-CM | POA: Diagnosis present

## 2015-01-17 DIAGNOSIS — Y95 Nosocomial condition: Secondary | ICD-10-CM | POA: Diagnosis not present

## 2015-01-17 DIAGNOSIS — J810 Acute pulmonary edema: Secondary | ICD-10-CM | POA: Diagnosis not present

## 2015-01-17 DIAGNOSIS — J45909 Unspecified asthma, uncomplicated: Secondary | ICD-10-CM | POA: Diagnosis present

## 2015-01-17 DIAGNOSIS — R0602 Shortness of breath: Secondary | ICD-10-CM

## 2015-01-17 DIAGNOSIS — J9690 Respiratory failure, unspecified, unspecified whether with hypoxia or hypercapnia: Secondary | ICD-10-CM

## 2015-01-17 DIAGNOSIS — Z4659 Encounter for fitting and adjustment of other gastrointestinal appliance and device: Secondary | ICD-10-CM

## 2015-01-17 DIAGNOSIS — E669 Obesity, unspecified: Secondary | ICD-10-CM | POA: Diagnosis present

## 2015-01-17 DIAGNOSIS — J9809 Other diseases of bronchus, not elsewhere classified: Secondary | ICD-10-CM | POA: Diagnosis not present

## 2015-01-17 DIAGNOSIS — Z9889 Other specified postprocedural states: Secondary | ICD-10-CM

## 2015-01-17 DIAGNOSIS — J441 Chronic obstructive pulmonary disease with (acute) exacerbation: Secondary | ICD-10-CM | POA: Diagnosis present

## 2015-01-17 DIAGNOSIS — R5381 Other malaise: Secondary | ICD-10-CM | POA: Diagnosis not present

## 2015-01-17 DIAGNOSIS — Z6841 Body Mass Index (BMI) 40.0 and over, adult: Secondary | ICD-10-CM | POA: Diagnosis not present

## 2015-01-17 DIAGNOSIS — Z9689 Presence of other specified functional implants: Secondary | ICD-10-CM

## 2015-01-17 DIAGNOSIS — J449 Chronic obstructive pulmonary disease, unspecified: Secondary | ICD-10-CM | POA: Diagnosis present

## 2015-01-17 DIAGNOSIS — I472 Ventricular tachycardia: Secondary | ICD-10-CM | POA: Diagnosis not present

## 2015-01-17 DIAGNOSIS — J9601 Acute respiratory failure with hypoxia: Secondary | ICD-10-CM | POA: Diagnosis not present

## 2015-01-17 DIAGNOSIS — J3800 Paralysis of vocal cords and larynx, unspecified: Secondary | ICD-10-CM | POA: Diagnosis not present

## 2015-01-17 DIAGNOSIS — Z952 Presence of prosthetic heart valve: Secondary | ICD-10-CM

## 2015-01-17 DIAGNOSIS — Z8679 Personal history of other diseases of the circulatory system: Secondary | ICD-10-CM

## 2015-01-17 DIAGNOSIS — R918 Other nonspecific abnormal finding of lung field: Secondary | ICD-10-CM | POA: Diagnosis not present

## 2015-01-17 DIAGNOSIS — Z01818 Encounter for other preprocedural examination: Secondary | ICD-10-CM

## 2015-01-17 HISTORY — DX: Presence of xenogenic heart valve: Z95.3

## 2015-01-17 HISTORY — DX: Family history of other specified conditions: Z84.89

## 2015-01-17 HISTORY — DX: Personal history of other diseases of the respiratory system: Z87.09

## 2015-01-17 HISTORY — DX: Malignant hyperthermia due to anesthesia, initial encounter: T88.3XXA

## 2015-01-17 LAB — COMPREHENSIVE METABOLIC PANEL
ALT: 16 U/L (ref 14–54)
ANION GAP: 10 (ref 5–15)
AST: 31 U/L (ref 15–41)
Albumin: 4.1 g/dL (ref 3.5–5.0)
Alkaline Phosphatase: 108 U/L (ref 38–126)
BUN: 18 mg/dL (ref 6–20)
CALCIUM: 9.2 mg/dL (ref 8.9–10.3)
CO2: 27 mmol/L (ref 22–32)
Chloride: 106 mmol/L (ref 101–111)
Creatinine, Ser: 0.98 mg/dL (ref 0.44–1.00)
GFR calc non Af Amer: 56 mL/min — ABNORMAL LOW (ref >60)
Glucose, Bld: 98 mg/dL (ref 65–99)
Potassium: 3.9 mmol/L (ref 3.5–5.1)
SODIUM: 143 mmol/L (ref 135–145)
TOTAL PROTEIN: 6.9 g/dL (ref 6.5–8.1)
Total Bilirubin: 0.5 mg/dL (ref 0.3–1.2)

## 2015-01-17 LAB — PULMONARY FUNCTION TEST
DL/VA % PRED: 63 %
DL/VA: 2.87 ml/min/mmHg/L
DLCO unc % pred: 34 %
DLCO unc: 7.38 ml/min/mmHg
FEF 25-75 PRE: 0.45 L/s
FEF 25-75 Post: 0.72 L/sec
FEF2575-%CHANGE-POST: 58 %
FEF2575-%PRED-PRE: 27 %
FEF2575-%Pred-Post: 43 %
FEV1-%Change-Post: 15 %
FEV1-%Pred-Post: 53 %
FEV1-%Pred-Pre: 45 %
FEV1-Post: 1.06 L
FEV1-Pre: 0.91 L
FEV1FVC-%Change-Post: 1 %
FEV1FVC-%PRED-PRE: 82 %
FEV6-%CHANGE-POST: 15 %
FEV6-%PRED-POST: 66 %
FEV6-%Pred-Pre: 57 %
FEV6-POST: 1.68 L
FEV6-PRE: 1.45 L
FEV6FVC-%Change-Post: 1 %
FEV6FVC-%Pred-Post: 105 %
FEV6FVC-%Pred-Pre: 103 %
FVC-%Change-Post: 13 %
FVC-%PRED-PRE: 55 %
FVC-%Pred-Post: 63 %
FVC-PRE: 1.48 L
FVC-Post: 1.68 L
POST FEV1/FVC RATIO: 63 %
POST FEV6/FVC RATIO: 100 %
Pre FEV1/FVC ratio: 62 %
Pre FEV6/FVC Ratio: 98 %
RV % pred: 114 %
RV: 2.45 L
TLC % pred: 86 %
TLC: 4.11 L

## 2015-01-17 LAB — BLOOD GAS, ARTERIAL
ACID-BASE EXCESS: 2.2 mmol/L — AB (ref 0.0–2.0)
BICARBONATE: 26.2 meq/L — AB (ref 20.0–24.0)
DRAWN BY: 428831
FIO2: 0.21 %
O2 Saturation: 96.6 %
PATIENT TEMPERATURE: 98.6
PH ART: 7.428 (ref 7.350–7.450)
TCO2: 27.5 mmol/L (ref 0–100)
pCO2 arterial: 40.4 mmHg (ref 35.0–45.0)
pO2, Arterial: 85.3 mmHg (ref 80.0–100.0)

## 2015-01-17 LAB — URINALYSIS
Bilirubin Urine: NEGATIVE
GLUCOSE, UA: NEGATIVE mg/dL
Hgb urine dipstick: NEGATIVE
LEUKOCYTES UA: NEGATIVE
Nitrite: NEGATIVE
PH: 5 (ref 5.0–8.0)
Protein, ur: NEGATIVE mg/dL
Specific Gravity, Urine: 1.02 (ref 1.005–1.030)
Urobilinogen, UA: 0.2 mg/dL (ref 0.0–1.0)

## 2015-01-17 LAB — PROTIME-INR
INR: 1.11 (ref 0.00–1.49)
Prothrombin Time: 14.4 seconds (ref 11.6–15.2)

## 2015-01-17 LAB — CBC
HCT: 37.7 % (ref 36.0–46.0)
Hemoglobin: 12.1 g/dL (ref 12.0–15.0)
MCH: 29.6 pg (ref 26.0–34.0)
MCHC: 32.1 g/dL (ref 30.0–36.0)
MCV: 92.2 fL (ref 78.0–100.0)
PLATELETS: 175 10*3/uL (ref 150–400)
RBC: 4.09 MIL/uL (ref 3.87–5.11)
RDW: 15.1 % (ref 11.5–15.5)
WBC: 5.9 10*3/uL (ref 4.0–10.5)

## 2015-01-17 LAB — ABO/RH: ABO/RH(D): A NEG

## 2015-01-17 LAB — SURGICAL PCR SCREEN
MRSA, PCR: NEGATIVE
Staphylococcus aureus: NEGATIVE

## 2015-01-17 LAB — APTT: APTT: 31 s (ref 24–37)

## 2015-01-17 MED ORDER — ALBUTEROL SULFATE (2.5 MG/3ML) 0.083% IN NEBU
2.5000 mg | INHALATION_SOLUTION | Freq: Once | RESPIRATORY_TRACT | Status: AC
  Administered 2015-01-17: 2.5 mg via RESPIRATORY_TRACT

## 2015-01-17 MED ORDER — CHLORHEXIDINE GLUCONATE 4 % EX LIQD
30.0000 mL | CUTANEOUS | Status: DC
Start: 2015-01-17 — End: 2015-01-18

## 2015-01-17 NOTE — Progress Notes (Signed)
Confirmed with pt that she did go for PFT and Doppler Studies today

## 2015-01-17 NOTE — Progress Notes (Signed)
Pre-op Cardiac Surgery  Carotid Findings:  1-39% ICA stenosis  Upper Extremity Right Left  Brachial Pressures 157T 149T  Radial Waveforms T T  Ulnar Waveforms T T  Palmar Arch (Allen's Test) WNL WNL   Findings:      Lower  Extremity Right Left  Dorsalis Pedis    Anterior Tibial    Posterior Tibial    Ankle/Brachial Indices      Findings:

## 2015-01-17 NOTE — Progress Notes (Addendum)
Medical Md is Dr.[name]  Multiple echo reports in epic with most recent in 2015  Cardiologist is Dr.Samuel McDowell  Heart cath report in epic from 11-01-14  Denies EKG in past month  Denies CXR in past 2 wks  Denies ever having a stress test

## 2015-01-17 NOTE — Progress Notes (Signed)
Anesthesia PAT Evaluation: Patient is a 74 year old female scheduled for AVR, Maze on 01/19/15 by Dr. Cornelius Moraswen.  History includes non-smoker, severe AS, chronic afib, COPD. BMI is consistent with morbid obesity. PCP is Dr. Ernestine ConradKirk [name]. Primary cardiologist is Dr. Nona DellSamuel McDowell.  Family history of MALIGNANT HYPERTHEMIA (reported as biopsy proven).  She reports that an uncle (father's brother) died in the OR during an unknown surgery in the lat 1950's. Her first cousin (aunt's daughter) died during a hysterectomy in the 1970's.  She reports that at that time, about 10 of her relatives (aunt and uncles' [place]) [place], and it was determined they were positive for MALIGNANT HYPERTHERMIA (some to varying degrees). She, her father and two siblings have never been tested. Her father has been afraid of anesthesia due to the family history, and has only had surgeries that could be done under regional anesthesia.  She doesn't think her siblings have had any issues with anesthesia (other than her sister being slow to wake up after surgery), but does not know for sure if they had trigger-free induction.  She has never had surgery herself.    Meds include albuterol, Pradaxa (on hold), Lasix, folic acid, fish oil, tramadol.  01/17/15 EKG, CXR, and labs noted. A1C is still pending.  11/01/14 Cardiac cath: Impression: 1. No angiographic evidence of CAD 2. Severe aortic valve stenosis   08/30/14 Echo: - Left ventricle: The cavity size was normal. Wall thickness was normal. Systolic function was normal. The estimated ejectionfraction was in the range of 55% to 60%. - Aortic valve: AV is thickened, calcified with restricted motion. Peak nad mean gradients through the valve are 75 and 48 mm Hgrespectively consistent with severe to critical AS> There wastrivial regurgitation. VTI ratio of LVOT to aortic valve: 0.28. Valve area (VTI): 0.87 cm^2. Indexed valve area (VTI): 0.38 cm^2/m^2. Valve  area (Vmax): 0.91 cm^2. Indexed valve area (Vmax): 0.39 cm^2/m^2. Mean velocity ratio of LVOT to aortic valve: 0.25. Valve area (Vmean): 0.79 cm^2. Indexed valve area (Vmean): 0.34 cm^2/m^2.  Mean gradient (S): 48 mm Hg. Peak gradient (S): 75 mm Hg. - Mitral valve: There was mild regurgitation. - Left atrium: The atrium was moderately to severely dilated. - Right ventricle: The cavity size was mildly dilated. Systolic function was mildly reduced. - Right atrium: The atrium was moderately dilated. - Pulmonary arteries: PA peak pressure: 62 mm Hg (S).  01/17/15 Preliminary carotid duplex findings: 1-39% ICA stenosis.   01/17/15 PFTs: FVC 1.48 (55%), FEV1 0.91 (45%), DLCOunc 7.38 (34%).   I notified anesthesiologist Dr. Noreene LarssonJoslin of her family history of MH.  Anticipate need for trigger free induction with reported biopsy proven MH in family, although she has not been tested herself and has not had prior surgeries. She understands that she will need general anesthesia for this procedure. We discussed use of trigger free induction in individuals with known MH or strong family history. She will discuss further with her assigned anesthesiologist on the day of surgery.   Velna Ochsllison Amalea Ottey, PA-C [facility]/Anesthesiology Phone [phone] 01/17/2015 6:14 PM

## 2015-01-17 NOTE — Pre-Procedure Instructions (Signed)
Sandra Turner  01/17/2015   Your procedure is scheduled on:  Wed, May 18 @ 8:30 AM  [facility] 6:30 AM.  Call this number if you have problems the morning of surgery: [phone]   Remember:   Do not eat food or drink liquids after midnight.   Take these medicines the morning of surgery with A SIP OF WATER: Albuterol<Bring Your Inhaler With You>               Stop taking all Vitamins and Herbal Medications. No Goody's,BC's,Aleve,Ibuprofen,Aspirin,or Fish Oil.    Do not wear jewelry, make-up or nail polish.  Do not wear lotions, powders, or perfumes.   Do not shave 48 hours prior to surgery.   Do not bring valuables to the hospital.  [facility] not responsible                  for any belongings or valuables.               Contacts, dentures or bridgework may not be worn into surgery.  Leave suitcase in the car. After surgery it may be brought to your room.  For patients admitted to the hospital, discharge time is determined by your                treatment team.                 Special Instructions:  [place] - Preparing for Surgery  Before surgery, you can play an important role.  Because skin is not sterile, your skin needs to be as free of germs as possible.  You can reduce the number of germs on you skin by washing with CHG (chlorahexidine gluconate) soap before surgery.  CHG is an antiseptic cleaner which kills germs and bonds with the skin to continue killing germs even after washing.  Please DO NOT use if you have an allergy to CHG or antibacterial soaps.  If your skin becomes reddened/irritated stop using the CHG and inform your nurse when you arrive at Short Stay.  Do not shave (including legs and underarms) for at least 48 hours prior to the first CHG shower.  You may shave your face.  Please follow these instructions carefully:   1.  Shower with CHG Soap the night before surgery and the                                morning of  Surgery.  2.  If you choose to wash your hair, wash your hair first as usual with your       normal shampoo.  3.  After you shampoo, rinse your hair and body thoroughly to remove the                      Shampoo.  4.  Use CHG as you would any other liquid soap.  You can apply chg directly       to the skin and wash gently with scrungie or a clean washcloth.  5.  Apply the CHG Soap to your body ONLY FROM THE NECK DOWN.        Do not use on open wounds or open sores.  Avoid contact with your eyes,       ears, mouth and genitals (private parts).  Wash genitals (private parts)  with your normal soap.  6.  Wash thoroughly, paying special attention to the area where your surgery        will be performed.  7.  Thoroughly rinse your body with warm water from the neck down.  8.  DO NOT shower/wash with your normal soap after using and rinsing off       the CHG Soap.  9.  Pat yourself dry with a clean towel.            10.  Wear clean pajamas.            11.  Place clean sheets on your bed the night of your first shower and do not        sleep with pets.  Day of Surgery  Do not apply any lotions/deoderants the morning of surgery.  Please wear clean clothes to the hospital/surgery center.     Please read over the following fact sheets that you were given: Pain Booklet, Coughing and Deep Breathing, Blood Transfusion Information, MRSA Information and Surgical Site Infection Prevention

## 2015-01-17 NOTE — Progress Notes (Signed)
Urine specimen was sent down with no label. I called and spoke with the nurse at Dr.Owen's office to let them know and she will collect there now since pt is there.

## 2015-01-17 NOTE — Progress Notes (Signed)
[address]       [place] [id number]             [phone]     CARDIOTHORACIC SURGERY OFFICE NOTE  Referring Provider is Kathleene HazelMcAlhany, Christopher D*  Primary Cardiologist is Jonelle SidleMcDowell, Samuel G, MD PCP is Ernestine ConradBLUTH, [name], MD   HPI:  Patient returns to the office today for follow-up of stage D severe symptomatic aortic stenosis. She was originally seen in consultation on 11/22/2014 and she was seen most recently on 12/27/2014. At that time we made a definitive plans for elective aortic valve replacement and maze procedure on Wednesday, 01/19/2015. The patient returns for routine follow-up today prior to surgery. She reports no new problems or complaints over the past 2 weeks.  She stopped taking Pradaxa last week in anticipation of surgery.   Current Outpatient Prescriptions  Medication Sig Dispense Refill  . albuterol (PROVENTIL) (2.5 MG/3ML) 0.083% nebulizer solution Take 3 mLs (2.5 mg total) by nebulization every 4 (four) hours as needed for wheezing or shortness of breath. 75 mL 12  . ALPHA LIPOIC ACID PO Take 1 capsule by mouth 2 (two) times daily.     . Ascorbic Acid (VITAMIN C PO) Take 1 tablet by mouth 2 (two) times daily.     . Aspirin-Salicylamide-Caffeine (ARTHRITIS STRENGTH BC POWDER PO) Take 2 packets by mouth daily as needed (arthritis pain).    . B Complex Vitamins (B COMPLEX PO) Take 1 tablet by mouth 2 (two) times daily.     Marland Kitchen. FOLIC ACID PO Take 1 tablet by mouth 2 (two) times daily.     . furosemide (LASIX) 20 MG tablet Take 1 tablet (20 mg total) by mouth daily. 30 tablet 0  . GINKGO BILOBA PO Take 1 tablet by mouth 2 (two) times daily.     . Ginseng (GIN-ZING PO) Take 1 tablet by mouth 2 (two) times daily.     Chilton Si. Green Tea, Camillia sinensis, (GREEN TEA PO) Take 1 tablet by mouth 2 (two) times daily.     Marland Kitchen. MAGNESIUM PO Take 1 tablet by mouth 2 (two) times daily.     . Methylsulfonylmethane (MSM PO) Take 2 tablets by mouth 2 (two) times daily.       . Nutritional Supplements (DHEA PO) Take 1 capsule by mouth 2 (two) times daily.    . Omega-3 Fatty Acids (FISH OIL PO) Take 2 capsules by mouth 2 (two) times daily.    Marland Kitchen. OVER THE COUNTER MEDICATION Take 1 tablet by mouth 2 (two) times daily. zinc    . Potassium 99 MG TABS Take 99 mg by mouth daily.    . traMADol (ULTRAM) 50 MG tablet Take 50 mg by mouth every 6 (six) hours as needed (pain).     Marland Kitchen. VITAMIN A PO Take 1 tablet by mouth 2 (two) times daily.     Marland Kitchen. VITAMIN E PO Take 1 tablet by mouth 2 (two) times daily.     . dabigatran (PRADAXA) 150 MG CAPS capsule TAKE ONE CAPSULE BY MOUTH EVERY 12 HOURS (Patient not taking: Reported on 01/17/2015) 60 capsule 6   No current facility-administered medications for this visit.   Facility-Administered Medications Ordered in Other Visits  Medication Dose Route Frequency Provider Last Rate Last Dose  . chlorhexidine (HIBICLENS) 4 % liquid 2 application  30 mL Topical UD Purcell Nailslarence H Griffen Frayne, MD          Physical Exam:   BP 136/77 mmHg  Pulse 76  Resp 16  Ht 5\' 2"  (1.575 m)  Wt 247 lb (112.038 kg)  BMI 45.17 kg/m2  SpO2 95%  General:  Well-appearing  Chest:   Clear to auscultation  CV:   Regular rate and rhythm with harsh systolic murmur  Incisions:  n/a  Abdomen:  Soft and nontender  Extremities:  Warm and well-perfused  Diagnostic Tests:  CHEST 2 VIEW  COMPARISON: 09/13/2014  FINDINGS: Cardiac shadow is mildly enlarged but stable. The previously seen infiltrative changes have resolved in the interval. No focal infiltrate or sizable effusion is seen. No significant vascular congestion is noted. Degenerative changes of the thoracic spine are seen.  IMPRESSION: Cardiomegaly stable in appearance. No other focal abnormality is seen.   Electronically Signed  By: Alcide CleverMark Lukens M.D.  On: 01/17/2015 13:55    Pulmonary Function Tests  Baseline      Post-bronchodilator  FVC  1.48 L  (55% predicted) FVC  1.68 L  (63%  predicted) FEV1  0.91 L  (45% predicted) FEV1  1.06 L  (53% predicted) FEF25-75 0.45 L  (27% predicted) FEF25-75 0.72 L  (43% predicted)  TLC  4.11 L  (86% predicted) RV  2.45 L  (114% predicted) DLCO  34% predicted     Impression:  Patient has stage D severe symptomatic aortic stenosis. She has been followed for the last 3 years with aortic stenosis and atrial fibrillation. She initially refused Coumadin therapy, but more recently she has been anticoagulated using Pradaxa. She was hospitalized in early January of this year with progressive shortness of breath, low-grade fever, and a productive cough consistent with likely acute bronchitis and COPD exacerbation with possible community-acquired pneumonia. Symptoms have improved since then and the patient has remained clinically stable with chronic exertional shortness of breath consistent with chronic diastolic congestive heart failure, currently New York Heart Association functional class II. I have personally reviewed the patient's recent transthoracic echocardiogram and diagnostic catheterization. Transthoracic echocardiogram confirms findings consistent with severe calcific aortic stenosis. There is severe thickening and calcification involving all 3 leaflets of the aortic valve. Peak velocity across the aortic valve measures anywhere between 4.8 and 5.0 m/s corresponding to mean transvalvular gradient approaching 50 mmHg, with hemodynamics further affected by the patient's underlying chronic persistent atrial fibrillation. Left ventricular systolic function remains preserved with ejection fraction estimated between 55 and 60%. Cardiac catheterization is notable for the absence of significant coronary artery disease and mild to moderate pulmonary hypertension. CT angiogram of the chest performed in 2013 demonstrates normal size of the ascending thoracic aorta without significant calcification or plaque within the aortic root. Risks associated  with conventional surgical aortic valve replacement would be mildly elevated because of the patient's age, obesity, and underlying chronic lung disease.  The patient does have moderate to severe COPD.  However, risks associated with conventional surgery would not be prohibitive, and the patient might benefit from concomitant Maze procedure.   The patient reports a family history of malignant hyperthermia with general anesthesia.    Plan:  I have again reviewed the indications, risks, and potential benefits of surgery with the patient in the office today. We plan to proceed with aortic valve replacement using a bioprosthetic tissue valve and Maze procedure on 01/19/2015. There is a possibility that aortic root enlargement or aortic root replacement may be necessary to avoid the potential for patient prosthetic mismatch. The patient understands and accepts all potential risks of surgery including but not limited to risk of death, stroke or  other neurologic complication, myocardial infarction, congestive heart failure, respiratory failure, renal failure, bleeding requiring transfusion and/or reexploration, arrhythmia, infection or other wound complications, pneumonia, pleural and/or pericardial effusion, pulmonary embolus, aortic dissection or other major vascular complication, or delayed complications related to valve repair or replacement including but not limited to structural valve deterioration and failure, thrombosis, embolization, endocarditis, or paravalvular leak.  Expectations for her postoperative convalescence of been discussed including the fact she that she will likely require some type of assistance on a continuous 24 hour per day basis for at least a week or 2 at the time of her hospital discharge. She states that her cousin plans to stay with her and she hopes to avoid going to a facility for rehab.  All of her questions have been addressed.  I spent in excess of 15 minutes during the conduct  of this office consultation and >50% of this time involved direct face-to-face encounter with the patient for counseling and/or coordination of their care.    Salvatore Decent. Cornelius Moras, MD 01/17/2015 2:00 PM

## 2015-01-17 NOTE — Patient Instructions (Signed)
Stop taking Goodie's powders or BC powders

## 2015-01-18 LAB — HEMOGLOBIN A1C
Hgb A1c MFr Bld: 5.3 % (ref 4.8–5.6)
Mean Plasma Glucose: 105 mg/dL

## 2015-01-18 MED ORDER — VANCOMYCIN HCL 1000 MG IV SOLR
INTRAVENOUS | Status: DC
  Filled 2015-01-18 (×2): qty 1000

## 2015-01-18 MED ORDER — DEXMEDETOMIDINE HCL IN NACL 400 MCG/100ML IV SOLN
0.1000 ug/kg/h | INTRAVENOUS | Status: AC
  Administered 2015-01-19: .3 ug/kg/h via INTRAVENOUS
  Filled 2015-01-18: qty 100

## 2015-01-18 MED ORDER — SODIUM CHLORIDE 0.9 % IV SOLN
INTRAVENOUS | Status: DC
  Filled 2015-01-18: qty 30

## 2015-01-18 MED ORDER — VANCOMYCIN HCL 10 G IV SOLR
1500.0000 mg | INTRAVENOUS | Status: AC
  Administered 2015-01-19: 1500 mg via INTRAVENOUS
  Filled 2015-01-18: qty 1500

## 2015-01-18 MED ORDER — POTASSIUM CHLORIDE 2 MEQ/ML IV SOLN
80.0000 meq | INTRAVENOUS | Status: DC
  Filled 2015-01-18: qty 40

## 2015-01-18 MED ORDER — INSULIN REGULAR HUMAN 100 UNIT/ML IJ SOLN
INTRAMUSCULAR | Status: DC
  Administered 2015-01-19: 10.2 [IU]/h via INTRAVENOUS
  Filled 2015-01-18: qty 2.5

## 2015-01-18 MED ORDER — METOPROLOL TARTRATE 12.5 MG HALF TABLET
12.5000 mg | ORAL_TABLET | Freq: Once | ORAL | Status: AC
  Administered 2015-01-19: 12.5 mg via ORAL
  Filled 2015-01-18: qty 1

## 2015-01-18 MED ORDER — DEXTROSE 5 % IV SOLN
1.5000 g | INTRAVENOUS | Status: DC
  Filled 2015-01-18 (×2): qty 1.5

## 2015-01-18 MED ORDER — NITROGLYCERIN IN D5W 200-5 MCG/ML-% IV SOLN
2.0000 ug/min | INTRAVENOUS | Status: DC
  Filled 2015-01-18: qty 250

## 2015-01-18 MED ORDER — DEXTROSE 5 % IV SOLN
750.0000 mg | INTRAVENOUS | Status: DC
  Filled 2015-01-18: qty 750

## 2015-01-18 MED ORDER — MAGNESIUM SULFATE 50 % IJ SOLN
40.0000 meq | INTRAMUSCULAR | Status: DC
  Filled 2015-01-18: qty 10

## 2015-01-18 MED ORDER — DOPAMINE-DEXTROSE 3.2-5 MG/ML-% IV SOLN
0.0000 ug/kg/min | INTRAVENOUS | Status: DC
  Filled 2015-01-18: qty 250

## 2015-01-18 MED ORDER — EPINEPHRINE HCL 1 MG/ML IJ SOLN
0.0000 ug/min | INTRAMUSCULAR | Status: DC
  Filled 2015-01-18: qty 4

## 2015-01-18 MED ORDER — SODIUM CHLORIDE 0.9 % IV SOLN
INTRAVENOUS | Status: DC
  Filled 2015-01-18: qty 40

## 2015-01-18 MED ORDER — DEXTROSE 5 % IV SOLN
30.0000 ug/min | INTRAVENOUS | Status: DC
  Filled 2015-01-18: qty 2

## 2015-01-18 MED ORDER — PLASMA-LYTE 148 IV SOLN
INTRAVENOUS | Status: AC
  Administered 2015-01-19: 50 mL
  Filled 2015-01-18 (×2): qty 2.5

## 2015-01-18 NOTE — H&P (Addendum)
[address]       [name] [address]             [phone]          CARDIOTHORACIC SURGERY HISTORY AND PHYSICAL EXAM  Referring Provider is [name]* MD Primary Cardiologist is [name], MD PCP is [name], MD  Chief Complaint  Patient presents with  . Aortic Stenosis    HPI:  Patient is a 74 year old obese white female from [place] referred to discuss treatment options for management of severe symptomatic aortic stenosis. The patient's cardiac history dates back to December 2013 when she was hospitalized acutely at [facility] with acute exacerbation of shortness of breath. She was found to be in atrial fibrillation and transthoracic echocardiogram demonstrated what was felt to be moderate aortic stenosis with signs of pulmonary hypertension. Left ventricular systolic function was normal. She was initially evaluated by [name]. The patient recalls that she was told she probably had pneumonia at that time. She refused anticoagulation using warfarin at that time. She has been followed ever since through the [facility] office in [place], most recently by [name]. She has remained in atrial fibrillation, and recently she has been chronically anticoagulated using Pradaxa. Follow-up echocardiograms have demonstrated progression of the severity of aortic stenosis. Echocardiogram performed 04/19/2014 revealed normal left ventricular systolic function with ejection fraction estimated 60-65% and severe aortic stenosis with mean transvalvular gradient estimated 56 mmHg. She was referred to the multidisciplinary heart valve clinic and was seen in consultation by Dr. [name] on 05/12/2014. At that time the patient was reluctant to proceed with any further diagnostic evaluation and plans were made for close follow-up on medical therapy. Follow-up echocardiogram performed 08/30/2014 revealed preserved left  ventricular systolic function with ejection fraction estimated 55-60%. There remained severe to "critical" aortic stenosis with mean velocity across the aortic valve estimated 48 mmHg. The patient was hospitalized for several days in early January with progressive shortness of breath, low-grade fever, and productive cough. She was treated for presumed acute bronchitis and COPD exacerbation, and chest radiographs suggests the possibility of community acquired pneumonia. Symptoms reportedly improve with antibiotics. She was seen in follow-up in early February by Dr. [name] and agreed to proceed with left and right heart catheterization to further evaluate the severity of aortic stenosis. Catheterization confirmed the presence of aortic stenosis with peak-to-peak and mean transvalvular gradients measured 43 and 29 mmHg, respectively. The patient did not have any significant coronary artery disease. There was mild to moderate pulmonary hypertension. The patient has been referred for surgical consultation to discuss treatment options further.  She was originally seen in consultation on 11/22/2014 and she was seen most recently on 12/27/2014. At that time we made a definitive plans for elective aortic valve replacement and maze procedure on Wednesday, 01/19/2015. The patient returns for routine follow-up today prior to surgery. She reports no new problems or complaints over the past 2 weeks. She stopped taking Pradaxa last week in anticipation of surgery.  The patient is single and lives alone in [place]. 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 She is divorced but has no children. She has several siblings who live in [place]. 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 She  describes  stable symptoms of exertional shortness of breath. She states that her physical mobility is limited more by arthritis in her knees and her shortness of breath, and typically her breathing reportedly does not bother her with ordinary day-to-day activities. She denies any history of chest pain or chest tightness either with activity or at rest. She has not had palpitations, dizzy spells, nor syncope. She denies any history of resting shortness of breath, PND, orthopnea. The patient has never been a smoker and she denies any significant second hand smoke exposure. She has been treated for chronic relapsing bronchitis and told that she has COPD. The patient reports a family history of malignant hyperthermia with general anesthesia.          Past Medical History  Diagnosis Date  . Arthritis   . Chronic atrial fibrillation     takes Pradaxa daily  . Aortic stenosis     Severe by echo August 2015  . Pneumonia 09/2014  . History of bronchitis 2015  . Joint pain   . Back pain     reason unknown  . Family history of adverse reaction to anesthesia     uncle with MH in the 60's,cousin in the 76's with MH  . Malignant hyperthermia     Patient without known history (no testing, no surgeries prior to 01/17/15), but reported biopsy proven MH in aunts/uncles/first cousins    Past Surgical History  Procedure Laterality Date  . None    . Left and right heart catheterization with coronary angiogram N/A 11/01/2014    Procedure: LEFT AND RIGHT HEART CATHETERIZATION WITH CORONARY ANGIOGRAM;  Surgeon: [name], MD;  Location: [facility] CATH LAB;  Service: Cardiovascular;  Laterality: N/A;    Family History  Problem Relation Age of Onset  . Breast cancer Mother   . Congestive Heart Failure Mother   . Cancer Father     ? Lymphoma  . Malignant hyperthermia Cousin     Social History History  Substance Use Topics  . Smoking status: Never Smoker   . Smokeless tobacco: Not on file  . Alcohol  Use: No    Prior to Admission medications   Medication Sig Start Date End Date Taking? Authorizing Provider  albuterol (PROVENTIL) (2.5 MG/3ML) 0.083% nebulizer solution Take 3 mLs (2.5 mg total) by nebulization every 4 (four) hours as needed for wheezing or shortness of breath. 09/15/14  Yes Erick Blinks, MD  ALPHA LIPOIC ACID PO Take 1 capsule by mouth 2 (two) times daily.    Yes Historical Provider, MD  Ascorbic Acid (VITAMIN C PO) Take 1 tablet by mouth 2 (two) times daily.    Yes Historical Provider, MD  Aspirin-Salicylamide-Caffeine (ARTHRITIS STRENGTH BC POWDER PO) Take 2 packets by mouth daily as needed (arthritis pain).   Yes Historical Provider, MD  B Complex Vitamins (B COMPLEX PO) Take 1 tablet by mouth 2 (two) times daily.    Yes Historical Provider, MD  FOLIC ACID PO Take 1 tablet by mouth 2 (two) times daily.    Yes Historical Provider, MD  furosemide (LASIX) 20 MG tablet Take 1 tablet (20 mg total) by mouth daily. 09/25/13  Yes Standley Brooking, MD  GINKGO BILOBA PO Take 1 tablet by mouth 2 (two) times daily.    Yes Historical Provider, MD  Ginseng (GIN-ZING PO) Take 1 tablet by mouth 2 (two) times daily.    Yes Historical Provider, MD  Chilton Si Tea, Camillia sinensis, (GREEN TEA PO) Take 1 tablet by  mouth 2 (two) times daily.    Yes Historical Provider, MD  MAGNESIUM PO Take 1 tablet by mouth 2 (two) times daily.    Yes Historical Provider, MD  Methylsulfonylmethane (MSM PO) Take 2 tablets by mouth 2 (two) times daily.    Yes Historical Provider, MD  Nutritional Supplements (DHEA PO) Take 1 capsule by mouth 2 (two) times daily.   Yes Historical Provider, MD  Omega-3 Fatty Acids (FISH OIL PO) Take 2 capsules by mouth 2 (two) times daily.   Yes Historical Provider, MD  OVER THE COUNTER MEDICATION Take 1 tablet by mouth 2 (two) times daily. zinc   Yes Historical Provider, MD  Potassium 99 MG TABS Take 99 mg by mouth daily.   Yes Historical Provider, MD  traMADol (ULTRAM) 50 MG tablet  Take 50 mg by mouth every 6 (six) hours as needed (pain).  05/03/14  Yes Historical Provider, MD  VITAMIN A PO Take 1 tablet by mouth 2 (two) times daily.    Yes Historical Provider, MD  VITAMIN E PO Take 1 tablet by mouth 2 (two) times daily.    Yes Historical Provider, MD  dabigatran (PRADAXA) 150 MG CAPS capsule TAKE ONE CAPSULE BY MOUTH EVERY 12 HOURS Patient not taking: Reported on 01/17/2015 09/27/14   [name], MD    Allergies  Allergen Reactions  . Bee Venom Swelling  . Latex Rash  . Penicillins Rash      Review of Systems:  General:normal appetite, normal energy, no weight gain, no weight loss, no fever Cardiac:no chest pain with exertion, no chest pain at rest, + SOB with exertion, no resting SOB, no PND, no orthopnea, no palpitations, no arrhythmia, no atrial fibrillation, no LE edema, no dizzy spells, no syncope Respiratory:+ exertional shortness of breath, no home oxygen, no productive cough, no dry cough, no bronchitis, no wheezing, no hemoptysis, no asthma, no pain with inspiration or cough, no sleep apnea, no CPAP at night GI:no difficulty swallowing, no reflux, no frequent heartburn, no hiatal hernia, no abdominal pain, no constipation, no diarrhea, no hematochezia, no hematemesis, no melena GU:no dysuria, no frequency, no urinary tract infection, no hematuria, no kidney stones, no kidney disease Vascular:no pain suggestive of claudication, no pain in feet, no leg cramps, no varicose veins, no DVT, no non-healing foot ulcer Neuro:no stroke, no TIA's, no seizures, no headaches, no temporary blindness one eye, no slurred speech, no peripheral neuropathy, no chronic pain, no instability of gait, no memory/cognitive  dysfunction Musculoskeletal:+ arthritis, no joint swelling, no myalgias, mild difficulty walking, mildly limited mobility  Skin:no rash, no itching, no skin infections, no pressure sores or ulcerations Psych:no anxiety, no depression, no nervousness, no unusual recent stress Eyes:no blurry vision, no floaters, no recent vision changes, does not wears glasses or contacts ENT:no hearing loss, no loose or painful teeth, no dentures, last saw dentist within the past year Hematologic:no easy bruising, no abnormal bleeding, no clotting disorder, no frequent epistaxis Endocrine:no diabetes, does not check CBG's at home       Physical Exam:  BP 156/89 mmHg  Pulse 60  Resp 16  Ht  (1.575 m)  Wt 240 lb (108.863 kg)  BMI 43.89 kg/m2  SpO2 93% General:Obese but o/w well-appearing HEENT:Unremarkable  Neck:no JVD, no bruits, no adenopathy  Chest:clear to auscultation, symmetrical breath sounds, no wheezes, no rhonchi  ZO:[id number] rate and rhythm, grade IV/VI crescendo/decrescendo murmur heard best at RUSB, no diastolic murmur Abdomen:soft, non-tender, no masses  Extremities:warm, well-perfused, pulses  diminished but palpable, no LE edema Rectal/GUDeferred Neuro:Grossly non-focal and symmetrical throughout Skin:Clean and dry, no rashes, no  breakdown   Diagnostic Tests:  Transthoracic Echocardiography  Patient:  Isabele, Lollar MR #:    [id number] Study Date: 08/30/2014 Gender:   F Age:    73 Height:   160 cm Weight:   112.9 kg BSA:    2.31 m^2 Pt. Status: Room:  ATTENDING  Nona Dell, M.D. ORDERING   Nona Dell, M.D. REFERRING  Nona Dell, M.D. PERFORMING  [facility], Jeani Hawking SONOGRAPHER Leta Jungling, RDCS  cc:  ------------------------------------------------------------------- LV EF: 55% -  60%  ------------------------------------------------------------------- Indications:   Aortic stenosis 424.1.  ------------------------------------------------------------------- History:  PMH:  Atrial fibrillation. Primary pulmonary hypertension.  ------------------------------------------------------------------- Study Conclusions  - Left ventricle: The cavity size was normal. Wall thickness was normal. Systolic function was normal. The estimated ejection fraction was in the range of 55% to 60%. - Aortic valve: AV is thickened, calcified with restricted motion. Peak nad mean gradients through the valve are 75 and 48 mm Hg respectively consistent with severe to critical AS> There was trivial regurgitation. - Mitral valve: There was mild regurgitation. - Left atrium: The atrium was moderately to severely dilated. - Right ventricle: The cavity size was mildly dilated. Systolic function was mildly reduced. - Right atrium: The atrium was moderately dilated. - Pulmonary arteries: PA peak pressure: 62 mm Hg (S).  Transthoracic echocardiography. M-mode, complete 2D, spectral Doppler, and color Doppler. Birthdate: Patient birthdate: 1940/11/20. Age: Patient is 74 yr old. Sex: Gender: female. BMI: 44.1 kg/m^2. Blood pressure:   128/64 Patient status: Inpatient. Study date: Study date: 08/30/2014. Study time: 03:46 PM. Location: Echo  laboratory.  -------------------------------------------------------------------  ------------------------------------------------------------------- Left ventricle: The cavity size was normal. Wall thickness was normal. Systolic function was normal. The estimated ejection fraction was in the range of 55% to 60%.  ------------------------------------------------------------------- Aortic valve: AV is thickened, calcified with restricted motion. Peak nad mean gradients through the valve are 75 and 48 mm Hg respectively consistent with severe to critical AS> Doppler: There was trivial regurgitation.  VTI ratio of LVOT to aortic valve: 0.28. Valve area (VTI): 0.87 cm^2. Indexed valve area (VTI): 0.38 cm^2/m^2. Valve area (Vmax): 0.91 cm^2. Indexed valve area (Vmax): 0.39 cm^2/m^2. Mean velocity ratio of LVOT to aortic valve: 0.25. Valve area (Vmean): 0.79 cm^2. Indexed valve area (Vmean): 0.34 cm^2/m^2.  Mean gradient (S): 48 mm Hg. Peak gradient (S): 75 mm Hg.  ------------------------------------------------------------------- Mitral valve:  Calcified annulus. Mildly thickened leaflets . Doppler: There was mild regurgitation.  Peak gradient (D): 10 mm Hg.  ------------------------------------------------------------------- Left atrium: The atrium was moderately to severely dilated.  ------------------------------------------------------------------- Right ventricle: The cavity size was mildly dilated. Systolic function was mildly reduced.  ------------------------------------------------------------------- Pulmonic valve:  Structurally normal valve.  Cusp separation was normal. Doppler: Transvalvular velocity was within the normal range. There was trivial regurgitation.  ------------------------------------------------------------------- Tricuspid valve:  Structurally normal valve.  Leaflet separation was normal. Doppler: Transvalvular velocity was within  the normal range. There was mild regurgitation.  ------------------------------------------------------------------- Right atrium: The atrium was moderately dilated.  ------------------------------------------------------------------- Pericardium: There was no pericardial effusion.  ------------------------------------------------------------------- Systemic veins: Inferior vena cava: The vessel was dilated. The respirophasic diameter changes were blunted (< 50%), consistent with elevated central venous pressure.  ------------------------------------------------------------------- Measurements  Left ventricle              Value     Reference LV ID, ED, PLAX chordal  51.9 mm    43 - 52 LV ID, ES, PLAX chordal          37.6 mm    23 - 38 LV fx shortening, PLAX chordal  (L)   28  %    >=29 LV PW thickness, ED            12.4 mm    --------- IVS/LV PW ratio, ED            0.87      <=1.3 Stroke volume, 2D             96  ml    --------- Stroke volume/bsa, 2D           42  ml/m^2  --------- LV e&', lateral              6.92 cm/s   --------- LV E/e&', lateral             22.69     --------- LV e&', medial               5.41 cm/s   --------- LV E/e&', medial              29.02     --------- LV e&', average              6.17 cm/s   --------- LV E/e&', average             25.47     ---------  Ventricular septum            Value     Reference IVS thickness, ED             10.8 mm    ---------  LVOT                   Value     Reference LVOT ID, S                20  mm    --------- LVOT area                 3.14 cm^2   --------- LVOT peak  velocity, S           126  cm/s   --------- LVOT mean velocity, S           83.4 cm/s   --------- LVOT VTI, S                30.7 cm    --------- LVOT peak gradient, S           6   mm Hg  ---------  Aortic valve               Value     Reference Aortic valve mean velocity, S       333  cm/s   --------- Aortic valve VTI, S            111  cm    --------- Aortic mean gradient, S          48  mm Hg  --------- Aortic peak gradient, S          75  mm Hg  --------- VTI ratio, LVOT/AV            0.28      --------- Aortic valve area, VTI          0.87 cm^2   --------- Aortic valve area/bsa, VTI  0.38 cm^2/m^2 --------- Aortic valve area, peak velocity     0.91 cm^2   --------- Aortic valve area/bsa, peak        0.39 cm^2/m^2 --------- velocity Velocity ratio, mean, LVOT/AV       0.25      --------- Aortic valve area, mean velocity     0.79 cm^2   --------- Aortic valve area/bsa, mean        0.34 cm^2/m^2 --------- velocity  Aorta                   Value     Reference Aortic root ID, ED            32  mm    ---------  Left atrium                Value     Reference LA ID, A-P, ES              49  mm    --------- LA ID/bsa, A-P              2.13 cm/m^2  <=2.2 LA volume, S               118  ml    --------- LA volume/bsa, S             51.2 ml/m^2  --------- LA volume, ES, 1-p A4C          124  ml    --------- LA volume/bsa, ES, 1-p A4C        53.8 ml/m^2  --------- LA volume, ES, 1-p A2C          107  ml    --------- LA volume/bsa, ES, 1-p A2C        46.4 ml/m^2   ---------  Mitral valve               Value     Reference Mitral E-wave peak velocity        157  cm/s   --------- Mitral deceleration time         165  ms    150 - 230 Mitral peak gradient, D          10  mm Hg  ---------  Pulmonary arteries            Value     Reference PA pressure, S, DP        (H)   62  mm Hg  <=30  Tricuspid valve              Value     Reference Tricuspid regurg peak velocity      341  cm/s   --------- Tricuspid peak RV-RA gradient       47  mm Hg  ---------  Systemic veins              Value     Reference Estimated CVP               15  mm Hg  ---------  Right ventricle              Value     Reference RV pressure, S, DP        (H)   62  mm Hg  <=30 RV s&', lateral, S             11.4 cm/s   ---------  Legend: (L) and (H) mark values outside specified  reference range.  ------------------------------------------------------------------- Prepared and Electronically Authenticated by  Dietrich Pates, M.D. 2015-12-28T17:48:39    Cardiac Catheterization Operative Report  Cheyenne Adas [id number] 2/29/20162:16 PM [name], MD  Procedure Performed:  1. Left Heart Catheterization 2. Selective Coronary Angiography 3. Right Heart Catheterization  Operator: Verne Carrow, MD  Indication: Severe aortic valve stenosis   Procedure Details: The risks, benefits, complications, treatment options, and expected outcomes were discussed with the patient. The patient and/or family concurred with the proposed plan, giving informed consent. The patient was brought to the cath lab after IV hydration was begun and oral premedication was given. The patient was further sedated with Versed and Fentanyl. The right  groin was prepped and draped in the usual manner. Using the modified Seldinger access technique, a 5 French sheath was placed in the right femoral artery. A 7 French sheath was placed in the right femoral vein. A balloon tipped catheter was used to perform a right heart catheterization. Standard diagnostic catheters were used to perform selective coronary angiography. I crossed the aortic valve with the JR4 catheter and a straight wire. LV pressures were measured. No LV gram was performed. There were no immediate complications. The patient was taken to the recovery area in stable condition.   Hemodynamic Findings: Ao: 134/67  LV: 177/13/18 RA: 13  RV: 59/2/12 PA: 63/23 (mean 39)  PCWP: 25 Fick Cardiac Output: 6.2 L/min Fick Cardiac Index: 3.0 L/min/m2 Central Aortic Saturation: 93% Pulmonary Artery Saturation: 66%  Aortic valve data:  Peak to peak gradient: 43 mm Hg Mean gradient: 29.3 mmHg AVA 1.1 cm2  Angiographic Findings:  Left main: No obstructive disease.   Left Anterior Descending Artery: Large caliber vessel that courses to the apex. Moderate caliber diagonal branch. Several small caliber distal diagonal branches. No obstructive disease.   Circumflex Artery: Large caliber vessel with a small early obtuse marginal branch and two moderate caliber obtuse marginal branches. No obstructive disease.   Right Coronary Artery: Large dominant vessel with no obstructive disease.   Left Ventricular Angiogram: Deferred.   Impression: 1. No angiographic evidence of CAD 2. Severe aortic valve stenosis   Recommendations: Will refer her to see Dr. Laneta Simmers or Dr. Cornelius Moras to discuss AVR. If she is not felt to be a good candidate for traditional AVR, will consider TAVR. Resume Pradaxa tomorrow morning.    Complications: None; patient tolerated the procedure well.      STS Risk  Calculator  ProcedureAVR  Risk of Mortality3.0% Morbidity or Mortality17.0% Prolonged LOS8.0% Short LOS30.4% Permanent Stroke1.5% Prolonged Vent Support12.8% DSW Infection0.6% Renal Failure4.1% Reoperation6.3%   CHEST 2 VIEW  COMPARISON: 09/13/2014  FINDINGS: Cardiac shadow is mildly enlarged but stable. The previously seen infiltrative changes have resolved in the interval. No focal infiltrate or sizable effusion is seen. No significant vascular congestion is noted. Degenerative changes of the thoracic spine are seen.  IMPRESSION: Cardiomegaly stable in appearance. No other focal abnormality is seen.   Electronically Signed  By: Alcide Clever M.D.  On: 01/17/2015 13:55    Pulmonary Function Tests  BaselinePost-bronchodilator  FVC1.48 L (55% predicted)FVC1.68 L (63% predicted) FEV10.91 L (45% predicted)FEV11.06 L (53% predicted) FEF25-750.45 L (27% predicted)FEF25-750.72 L (43% predicted)  TLC4.11 L (86% predicted) RV2.45 L (114% predicted) DLCO34% predicted    Impression:  Patient has stage D severe symptomatic aortic stenosis. She has been followed for the last 3 years with aortic stenosis and atrial fibrillation. She initially refused Coumadin therapy, but more recently she has been anticoagulated using Pradaxa. She  was hospitalized in early January of this year with progressive  shortness of breath, low-grade fever, and a productive cough consistent with likely acute bronchitis and COPD exacerbation with possible community-acquired pneumonia. Symptoms have improved since then and the patient has remained clinically stable with chronic exertional shortness of breath consistent with chronic diastolic congestive heart failure, currently New York Heart Association functional class II. I have personally reviewed the patient's recent transthoracic echocardiogram and diagnostic catheterization. Transthoracic echocardiogram confirms findings consistent with severe calcific aortic stenosis. There is severe thickening and calcification involving all 3 leaflets of the aortic valve. Peak velocity across the aortic valve measures anywhere between 4.8 and 5.0 m/s corresponding to mean transvalvular gradient approaching 50 mmHg, with hemodynamics further affected by the patient's underlying chronic persistent atrial fibrillation. Left ventricular systolic function remains preserved with ejection fraction estimated between 55 and 60%. Cardiac catheterization is notable for the absence of significant coronary artery disease and mild to moderate pulmonary hypertension. CT angiogram of the chest performed in 2013 demonstrates normal size of the ascending thoracic aorta without significant calcification or plaque within the aortic root. Risks associated with conventional surgical aortic valve replacement would be mildly elevated because of the patient's age, obesity, and underlying chronic lung disease. The patient does have moderate to severe COPD. However, risks associated with conventional surgery would not be prohibitive, and the patient might benefit from concomitant Maze procedure. The patient reports a family history of malignant hyperthermia with general anesthesia.    Plan:  I have again reviewed the indications, risks, and potential benefits of surgery with the patient in the office  today. We plan to proceed with aortic valve replacement using a bioprosthetic tissue valve and Maze procedure on 01/19/2015. There is a possibility that aortic root enlargement or aortic root replacement may be necessary to avoid the potential for patient prosthetic mismatch. The patient understands and accepts all potential risks of surgery including but not limited to risk of death, stroke or other neurologic complication, myocardial infarction, congestive heart failure, respiratory failure, renal failure, bleeding requiring transfusion and/or reexploration, arrhythmia, infection or other wound complications, pneumonia, pleural and/or pericardial effusion, pulmonary embolus, aortic dissection or other major vascular complication, or delayed complications related to valve repair or replacement including but not limited to structural valve deterioration and failure, thrombosis, embolization, endocarditis, or paravalvular leak. Expectations for her postoperative convalescence of been discussed including the fact she that she will likely require some type of assistance on a continuous 24 hour per day basis for at least a week or 2 at the time of her hospital discharge. She states that her cousin plans to stay with her and she hopes to avoid going to a facility for rehab. All of her questions have been addressed.   Salvatore Decent. Cornelius Moras, MD 01/17/2015 2:00 PM

## 2015-01-19 ENCOUNTER — Encounter (HOSPITAL_COMMUNITY): Payer: Self-pay | Admitting: Surgery

## 2015-01-19 ENCOUNTER — Inpatient Hospital Stay (HOSPITAL_COMMUNITY): Payer: Medicare Other | Admitting: Critical Care Medicine

## 2015-01-19 ENCOUNTER — Inpatient Hospital Stay (HOSPITAL_COMMUNITY): Payer: Medicare Other | Admitting: Vascular Surgery

## 2015-01-19 ENCOUNTER — Inpatient Hospital Stay (HOSPITAL_COMMUNITY): Payer: Medicare Other

## 2015-01-19 ENCOUNTER — Encounter (HOSPITAL_COMMUNITY)
Admission: RE | Disposition: A | Discharge status: Skilled Nursing Facility | Payer: Medicare Other | Source: Ambulatory Visit | Attending: Thoracic Surgery (Cardiothoracic Vascular Surgery)

## 2015-01-19 DIAGNOSIS — Z9889 Other specified postprocedural states: Secondary | ICD-10-CM

## 2015-01-19 DIAGNOSIS — I481 Persistent atrial fibrillation: Secondary | ICD-10-CM

## 2015-01-19 DIAGNOSIS — Z953 Presence of xenogenic heart valve: Secondary | ICD-10-CM

## 2015-01-19 DIAGNOSIS — I350 Nonrheumatic aortic (valve) stenosis: Secondary | ICD-10-CM

## 2015-01-19 DIAGNOSIS — Z8679 Personal history of other diseases of the circulatory system: Secondary | ICD-10-CM

## 2015-01-19 HISTORY — PX: AORTIC VALVE REPLACEMENT: SHX41

## 2015-01-19 HISTORY — DX: Presence of xenogenic heart valve: Z95.3

## 2015-01-19 HISTORY — PX: AORTIC ROOT ENLARGEMENT: SHX6346

## 2015-01-19 HISTORY — PX: MAZE: SHX5063

## 2015-01-19 HISTORY — PX: TEE WITHOUT CARDIOVERSION: SHX5443

## 2015-01-19 LAB — POCT I-STAT, CHEM 8
BUN: 12 mg/dL (ref 6–20)
BUN: 14 mg/dL (ref 6–20)
BUN: 14 mg/dL (ref 6–20)
BUN: 14 mg/dL (ref 6–20)
BUN: 15 mg/dL (ref 6–20)
BUN: 15 mg/dL (ref 6–20)
BUN: 16 mg/dL (ref 6–20)
BUN: 17 mg/dL (ref 6–20)
CALCIUM ION: 1.04 mmol/L — AB (ref 1.13–1.30)
CALCIUM ION: 1.15 mmol/L (ref 1.13–1.30)
CHLORIDE: 97 mmol/L — AB (ref 101–111)
CHLORIDE: 112 mmol/L — AB (ref 101–111)
CHLORIDE: 101 mmol/L (ref 101–111)
CREATININE: 0.6 mg/dL (ref 0.44–1.00)
CREATININE: 0.7 mg/dL (ref 0.44–1.00)
Calcium, Ion: 0.87 mmol/L — ABNORMAL LOW (ref 1.13–1.30)
Calcium, Ion: 0.99 mmol/L — ABNORMAL LOW (ref 1.13–1.30)
Calcium, Ion: 1.07 mmol/L — ABNORMAL LOW (ref 1.13–1.30)
Calcium, Ion: 1.22 mmol/L (ref 1.13–1.30)
Calcium, Ion: 1.24 mmol/L (ref 1.13–1.30)
Calcium, Ion: 1.27 mmol/L (ref 1.13–1.30)
Chloride: 101 mmol/L (ref 101–111)
Chloride: 101 mmol/L (ref 101–111)
Chloride: 102 mmol/L (ref 101–111)
Chloride: 103 mmol/L (ref 101–111)
Chloride: 104 mmol/L (ref 101–111)
Creatinine, Ser: 0.6 mg/dL (ref 0.44–1.00)
Creatinine, Ser: 0.6 mg/dL (ref 0.44–1.00)
Creatinine, Ser: 0.6 mg/dL (ref 0.44–1.00)
Creatinine, Ser: 0.7 mg/dL (ref 0.44–1.00)
Creatinine, Ser: 0.6 mg/dL (ref 0.44–1.00)
Creatinine, Ser: 0.6 mg/dL (ref 0.44–1.00)
GLUCOSE: 156 mg/dL — AB (ref 65–99)
GLUCOSE: 89 mg/dL (ref 65–99)
Glucose, Bld: 113 mg/dL — ABNORMAL HIGH (ref 65–99)
Glucose, Bld: 93 mg/dL (ref 65–99)
Glucose, Bld: 143 mg/dL — ABNORMAL HIGH (ref 65–99)
Glucose, Bld: 146 mg/dL — ABNORMAL HIGH (ref 65–99)
Glucose, Bld: 122 mg/dL — ABNORMAL HIGH (ref 65–99)
Glucose, Bld: 125 mg/dL — ABNORMAL HIGH (ref 65–99)
HCT: 21 % — ABNORMAL LOW (ref 36.0–46.0)
HCT: 24 % — ABNORMAL LOW (ref 36.0–46.0)
HCT: 28 % — ABNORMAL LOW (ref 36.0–46.0)
HCT: 28 % — ABNORMAL LOW (ref 36.0–46.0)
HCT: 29 % — ABNORMAL LOW (ref 36.0–46.0)
HCT: 34 % — ABNORMAL LOW (ref 36.0–46.0)
HEMATOCRIT: 24 % — AB (ref 36.0–46.0)
HEMATOCRIT: 26 % — AB (ref 36.0–46.0)
HEMOGLOBIN: 11.6 g/dL — AB (ref 12.0–15.0)
HEMOGLOBIN: 9.5 g/dL — AB (ref 12.0–15.0)
HEMOGLOBIN: 9.9 g/dL — AB (ref 12.0–15.0)
Hemoglobin: 8.2 g/dL — ABNORMAL LOW (ref 12.0–15.0)
Hemoglobin: 7.1 g/dL — ABNORMAL LOW (ref 12.0–15.0)
Hemoglobin: 8.2 g/dL — ABNORMAL LOW (ref 12.0–15.0)
Hemoglobin: 8.8 g/dL — ABNORMAL LOW (ref 12.0–15.0)
Hemoglobin: 9.5 g/dL — ABNORMAL LOW (ref 12.0–15.0)
POTASSIUM: 3.5 mmol/L (ref 3.5–5.1)
POTASSIUM: 4 mmol/L (ref 3.5–5.1)
Potassium: 3.8 mmol/L (ref 3.5–5.1)
Potassium: 3.6 mmol/L (ref 3.5–5.1)
Potassium: 3.8 mmol/L (ref 3.5–5.1)
Potassium: 3.9 mmol/L (ref 3.5–5.1)
Potassium: 3.8 mmol/L (ref 3.5–5.1)
Potassium: 3.9 mmol/L (ref 3.5–5.1)
SODIUM: 141 mmol/L (ref 135–145)
SODIUM: 132 mmol/L — AB (ref 135–145)
SODIUM: 141 mmol/L (ref 135–145)
Sodium: 136 mmol/L (ref 135–145)
Sodium: 139 mmol/L (ref 135–145)
Sodium: 140 mmol/L (ref 135–145)
Sodium: 141 mmol/L (ref 135–145)
Sodium: 142 mmol/L (ref 135–145)
TCO2: 22 mmol/L (ref 0–100)
TCO2: 22 mmol/L (ref 0–100)
TCO2: 22 mmol/L (ref 0–100)
TCO2: 24 mmol/L (ref 0–100)
TCO2: 24 mmol/L (ref 0–100)
TCO2: 25 mmol/L (ref 0–100)
TCO2: 27 mmol/L (ref 0–100)
TCO2: 27 mmol/L (ref 0–100)

## 2015-01-19 LAB — POCT I-STAT 3, ART BLOOD GAS (G3+)
ACID-BASE EXCESS: 1 mmol/L (ref 0.0–2.0)
Acid-Base Excess: 4 mmol/L — ABNORMAL HIGH (ref 0.0–2.0)
Acid-Base Excess: 5 mmol/L — ABNORMAL HIGH (ref 0.0–2.0)
Acid-Base Excess: 2 mmol/L (ref 0.0–2.0)
BICARBONATE: 26.7 meq/L — AB (ref 20.0–24.0)
Bicarbonate: 28.3 mEq/L — ABNORMAL HIGH (ref 20.0–24.0)
Bicarbonate: 28.2 mEq/L — ABNORMAL HIGH (ref 20.0–24.0)
Bicarbonate: 26.8 mEq/L — ABNORMAL HIGH (ref 20.0–24.0)
O2 SAT: 96 %
O2 Saturation: 100 %
O2 Saturation: 100 %
O2 Saturation: 100 %
PCO2 ART: 41.1 mmHg (ref 35.0–45.0)
PCO2 ART: 36.8 mmHg (ref 35.0–45.0)
PCO2 ART: 39.6 mmHg (ref 35.0–45.0)
PH ART: 7.493 — AB (ref 7.350–7.450)
PH ART: 7.438 (ref 7.350–7.450)
PH ART: 7.38 (ref 7.350–7.450)
PO2 ART: 381 mmHg — AB (ref 80.0–100.0)
PO2 ART: 296 mmHg — AB (ref 80.0–100.0)
PO2 ART: 80 mmHg (ref 80.0–100.0)
TCO2: 29 mmol/L (ref 0–100)
TCO2: 29 mmol/L (ref 0–100)
TCO2: 28 mmol/L (ref 0–100)
TCO2: 28 mmol/L (ref 0–100)
pCO2 arterial: 44.4 mmHg (ref 35.0–45.0)
pH, Arterial: 7.445 (ref 7.350–7.450)
pO2, Arterial: 296 mmHg — ABNORMAL HIGH (ref 80.0–100.0)

## 2015-01-19 LAB — CBC
HEMATOCRIT: 27.4 % — AB (ref 36.0–46.0)
HEMATOCRIT: 28.7 % — AB (ref 36.0–46.0)
HEMOGLOBIN: 9 g/dL — AB (ref 12.0–15.0)
Hemoglobin: 9.8 g/dL — ABNORMAL LOW (ref 12.0–15.0)
MCH: 29.7 pg (ref 26.0–34.0)
MCH: 30.3 pg (ref 26.0–34.0)
MCHC: 32.8 g/dL (ref 30.0–36.0)
MCHC: 34.1 g/dL (ref 30.0–36.0)
MCV: 90.4 fL (ref 78.0–100.0)
MCV: 88.9 fL (ref 78.0–100.0)
Platelets: 147 10*3/uL — ABNORMAL LOW (ref 150–400)
Platelets: 156 10*3/uL (ref 150–400)
RBC: 3.03 MIL/uL — ABNORMAL LOW (ref 3.87–5.11)
RBC: 3.23 MIL/uL — AB (ref 3.87–5.11)
RDW: 15 % (ref 11.5–15.5)
RDW: 14.7 % (ref 11.5–15.5)
WBC: 10 10*3/uL (ref 4.0–10.5)
WBC: 10.3 10*3/uL (ref 4.0–10.5)

## 2015-01-19 LAB — GLUCOSE, CAPILLARY
GLUCOSE-CAPILLARY: 107 mg/dL — AB (ref 65–99)
GLUCOSE-CAPILLARY: 146 mg/dL — AB (ref 65–99)
Glucose-Capillary: 101 mg/dL — ABNORMAL HIGH (ref 65–99)
Glucose-Capillary: 125 mg/dL — ABNORMAL HIGH (ref 65–99)

## 2015-01-19 LAB — APTT
APTT: 40 s — AB (ref 24–37)
APTT: 44 s — AB (ref 24–37)

## 2015-01-19 LAB — POCT I-STAT GLUCOSE
Glucose, Bld: 96 mg/dL (ref 65–99)
Glucose, Bld: 118 mg/dL — ABNORMAL HIGH (ref 65–99)
OPERATOR ID: 173792
Operator id: 3402

## 2015-01-19 LAB — PREPARE RBC (CROSSMATCH)

## 2015-01-19 LAB — PROTIME-INR
INR: 1.81 — AB (ref 0.00–1.49)
INR: 1.68 — ABNORMAL HIGH (ref 0.00–1.49)
Prothrombin Time: 21.1 seconds — ABNORMAL HIGH (ref 11.6–15.2)
Prothrombin Time: 20 seconds — ABNORMAL HIGH (ref 11.6–15.2)

## 2015-01-19 LAB — POCT I-STAT 4, (NA,K, GLUC, HGB,HCT)
Glucose, Bld: 98 mg/dL (ref 65–99)
HCT: 26 % — ABNORMAL LOW (ref 36.0–46.0)
Hemoglobin: 8.8 g/dL — ABNORMAL LOW (ref 12.0–15.0)
Potassium: 4 mmol/L (ref 3.5–5.1)
Sodium: 141 mmol/L (ref 135–145)

## 2015-01-19 LAB — HEMOGLOBIN AND HEMATOCRIT, BLOOD
HCT: 25.3 % — ABNORMAL LOW (ref 36.0–46.0)
HEMATOCRIT: 25.7 % — AB (ref 36.0–46.0)
Hemoglobin: 8.4 g/dL — ABNORMAL LOW (ref 12.0–15.0)
Hemoglobin: 8.7 g/dL — ABNORMAL LOW (ref 12.0–15.0)

## 2015-01-19 LAB — PLATELET COUNT
Platelets: 96 10*3/uL — ABNORMAL LOW (ref 150–400)
Platelets: 101 10*3/uL — ABNORMAL LOW (ref 150–400)

## 2015-01-19 LAB — FIBRINOGEN: FIBRINOGEN: 181 mg/dL — AB (ref 204–475)

## 2015-01-19 SURGERY — REPLACEMENT, AORTIC VALVE, OPEN
Anesthesia: General | Site: Chest

## 2015-01-19 MED ORDER — FENTANYL CITRATE (PF) 250 MCG/5ML IJ SOLN
INTRAMUSCULAR | Status: AC
  Filled 2015-01-19: qty 5

## 2015-01-19 MED ORDER — OXYCODONE HCL 5 MG PO TABS
5.0000 mg | ORAL_TABLET | ORAL | Status: DC | PRN
  Administered 2015-01-20: 5 mg via ORAL
  Administered 2015-01-21 – 2015-01-30 (×3): 10 mg via ORAL
  Filled 2015-01-19: qty 2
  Filled 2015-01-19 (×2): qty 1
  Filled 2015-01-19: qty 2
  Filled 2015-01-19: qty 1

## 2015-01-19 MED ORDER — LACTATED RINGERS IV SOLN: INTRAVENOUS | Status: DC

## 2015-01-19 MED ORDER — ACETAMINOPHEN 500 MG PO TABS
1000.0000 mg | ORAL_TABLET | Freq: Four times a day (QID) | ORAL | Status: AC
Start: 2015-01-20 — End: 2015-01-24
  Administered 2015-01-20 – 2015-01-24 (×14): 1000 mg via ORAL
  Filled 2015-01-19 (×19): qty 2

## 2015-01-19 MED ORDER — PROPOFOL 10 MG/ML IV BOLUS
INTRAVENOUS | Status: DC | PRN
  Administered 2015-01-19: 100 mg via INTRAVENOUS

## 2015-01-19 MED ORDER — FAMOTIDINE IN NACL 20-0.9 MG/50ML-% IV SOLN
20.0000 mg | Freq: Two times a day (BID) | INTRAVENOUS | Status: AC
  Administered 2015-01-19 – 2015-01-20 (×2): 20 mg via INTRAVENOUS
  Filled 2015-01-19: qty 50

## 2015-01-19 MED ORDER — LIDOCAINE HCL (CARDIAC) 20 MG/ML IV SOLN
INTRAVENOUS | Status: AC
  Filled 2015-01-19: qty 5

## 2015-01-19 MED ORDER — LACTATED RINGERS IV SOLN
INTRAVENOUS | Status: DC | PRN
  Administered 2015-01-19: 08:00:00 via INTRAVENOUS

## 2015-01-19 MED ORDER — MILRINONE IN DEXTROSE 20 MG/100ML IV SOLN
0.1250 ug/kg/min | INTRAVENOUS | Status: AC
  Administered 2015-01-19: .3 ug/kg/min via INTRAVENOUS
  Filled 2015-01-19: qty 100

## 2015-01-19 MED ORDER — HEPARIN SODIUM (PORCINE) 1000 UNIT/ML IJ SOLN
INTRAMUSCULAR | Status: AC
  Filled 2015-01-19: qty 1

## 2015-01-19 MED ORDER — ARTIFICIAL TEARS OP OINT
TOPICAL_OINTMENT | OPHTHALMIC | Status: AC
  Filled 2015-01-19: qty 3.5

## 2015-01-19 MED ORDER — SODIUM CHLORIDE 0.9 % IV SOLN
INTRAVENOUS | Status: DC
  Administered 2015-01-20: 16:00:00 via INTRAVENOUS

## 2015-01-19 MED ORDER — BISACODYL 5 MG PO TBEC
10.0000 mg | DELAYED_RELEASE_TABLET | Freq: Every day | ORAL | Status: DC
  Administered 2015-01-20 – 2015-01-26 (×4): 10 mg via ORAL
  Filled 2015-01-19 (×6): qty 2

## 2015-01-19 MED ORDER — SODIUM CHLORIDE 0.45 % IV SOLN: INTRAVENOUS | Status: DC | PRN

## 2015-01-19 MED ORDER — FENTANYL CITRATE (PF) 100 MCG/2ML IJ SOLN
INTRAMUSCULAR | Status: DC | PRN
  Administered 2015-01-19: 50 ug via INTRAVENOUS
  Administered 2015-01-19: 250 ug via INTRAVENOUS
  Administered 2015-01-19: 50 ug via INTRAVENOUS
  Administered 2015-01-19 (×2): 100 ug via INTRAVENOUS
  Administered 2015-01-19: 150 ug via INTRAVENOUS
  Administered 2015-01-19: 200 ug via INTRAVENOUS
  Administered 2015-01-19: 100 ug via INTRAVENOUS
  Administered 2015-01-19: 50 ug via INTRAVENOUS
  Administered 2015-01-19: 100 ug via INTRAVENOUS
  Administered 2015-01-19: 150 ug via INTRAVENOUS
  Administered 2015-01-19: 50 ug via INTRAVENOUS
  Administered 2015-01-19 (×4): 100 ug via INTRAVENOUS
  Administered 2015-01-19: 200 ug via INTRAVENOUS
  Administered 2015-01-19 (×2): 100 ug via INTRAVENOUS
  Administered 2015-01-19: 150 ug via INTRAVENOUS
  Administered 2015-01-19: 50 ug via INTRAVENOUS
  Administered 2015-01-19: 150 ug via INTRAVENOUS

## 2015-01-19 MED ORDER — ACETAMINOPHEN 160 MG/5ML PO SOLN
1000.0000 mg | Freq: Four times a day (QID) | ORAL | Status: DC
  Administered 2015-01-20 (×2): 1000 mg
  Filled 2015-01-19 (×2): qty 40.6

## 2015-01-19 MED ORDER — MAGNESIUM SULFATE 4 GM/100ML IV SOLN
4.0000 g | Freq: Once | INTRAVENOUS | Status: AC
  Administered 2015-01-19: 4 g via INTRAVENOUS
  Filled 2015-01-19: qty 100

## 2015-01-19 MED ORDER — MIDAZOLAM HCL 10 MG/2ML IJ SOLN
INTRAMUSCULAR | Status: AC
  Filled 2015-01-19: qty 2

## 2015-01-19 MED ORDER — ARTIFICIAL TEARS OP OINT
TOPICAL_OINTMENT | OPHTHALMIC | Status: DC | PRN
  Administered 2015-01-19: 1 via OPHTHALMIC

## 2015-01-19 MED ORDER — NITROGLYCERIN 0.2 MG/ML ON CALL CATH LAB
INTRAVENOUS | Status: DC | PRN
  Administered 2015-01-19: 60 ug via INTRAVENOUS

## 2015-01-19 MED ORDER — ROCURONIUM BROMIDE 100 MG/10ML IV SOLN
INTRAVENOUS | Status: DC | PRN
  Administered 2015-01-19 (×2): 50 mg via INTRAVENOUS
  Administered 2015-01-19: 30 mg via INTRAVENOUS
  Administered 2015-01-19: 70 mg via INTRAVENOUS
  Administered 2015-01-19: 50 mg via INTRAVENOUS

## 2015-01-19 MED ORDER — METOPROLOL TARTRATE 1 MG/ML IV SOLN
2.5000 mg | INTRAVENOUS | Status: DC | PRN
Start: 2015-01-19 — End: 2015-02-21

## 2015-01-19 MED ORDER — ROCURONIUM BROMIDE 50 MG/5ML IV SOLN
INTRAVENOUS | Status: AC
  Filled 2015-01-19: qty 2

## 2015-01-19 MED ORDER — PHENYLEPHRINE HCL 10 MG/ML IJ SOLN
10.0000 mg | INTRAVENOUS | Status: DC | PRN
  Administered 2015-01-19: 25 ug/min via INTRAVENOUS

## 2015-01-19 MED ORDER — SODIUM CHLORIDE 0.9 % IV SOLN
0.5000 g/h | Freq: Once | INTRAVENOUS | Status: DC
  Filled 2015-01-19: qty 20

## 2015-01-19 MED ORDER — VANCOMYCIN HCL IN DEXTROSE 1-5 GM/200ML-% IV SOLN
1000.0000 mg | Freq: Once | INTRAVENOUS | Status: AC
  Administered 2015-01-19: 1000 mg via INTRAVENOUS
  Filled 2015-01-19: qty 200

## 2015-01-19 MED ORDER — SODIUM CHLORIDE 0.9 % IV SOLN
INTRAVENOUS | Status: DC
  Administered 2015-01-19: 0.8 [IU]/h via INTRAVENOUS
  Filled 2015-01-19 (×2): qty 2.5

## 2015-01-19 MED ORDER — MORPHINE SULFATE 2 MG/ML IJ SOLN
2.0000 mg | INTRAMUSCULAR | Status: DC | PRN
  Administered 2015-01-20: 2 mg via INTRAVENOUS
  Filled 2015-01-19: qty 1

## 2015-01-19 MED ORDER — SODIUM CHLORIDE 0.9 % IV SOLN
INTRAVENOUS | Status: AC
  Administered 2015-01-19: 17:00:00 via INTRAVENOUS

## 2015-01-19 MED ORDER — SODIUM CHLORIDE 0.9 % IJ SOLN
3.0000 mL | Freq: Two times a day (BID) | INTRAMUSCULAR | Status: DC
  Administered 2015-01-20 – 2015-01-21 (×3): 3 mL via INTRAVENOUS
  Administered 2015-01-22: 10 mL via INTRAVENOUS
  Administered 2015-01-23 – 2015-02-01 (×10): 3 mL via INTRAVENOUS

## 2015-01-19 MED ORDER — CALCIUM CHLORIDE 10 % IV SOLN
INTRAVENOUS | Status: AC
  Filled 2015-01-19: qty 20

## 2015-01-19 MED ORDER — MORPHINE SULFATE 2 MG/ML IJ SOLN
1.0000 mg | INTRAMUSCULAR | Status: AC | PRN
  Administered 2015-01-19 (×2): 2 mg via INTRAVENOUS
  Filled 2015-01-19 (×2): qty 1

## 2015-01-19 MED ORDER — DEXTROSE 5 % IV SOLN: 10.0000 mg | INTRAVENOUS | Status: DC | PRN

## 2015-01-19 MED ORDER — NITROGLYCERIN IN D5W 200-5 MCG/ML-% IV SOLN: 0.0000 ug/min | INTRAVENOUS | Status: DC

## 2015-01-19 MED ORDER — NITROGLYCERIN IN D5W 200-5 MCG/ML-% IV SOLN
INTRAVENOUS | Status: DC | PRN
  Administered 2015-01-19: 5 ug/min via INTRAVENOUS

## 2015-01-19 MED ORDER — LACTATED RINGERS IV SOLN
INTRAVENOUS | Status: DC | PRN
  Administered 2015-01-19 (×2): via INTRAVENOUS

## 2015-01-19 MED ORDER — METOPROLOL TARTRATE 25 MG/10 ML ORAL SUSPENSION
12.5000 mg | Freq: Two times a day (BID) | ORAL | Status: DC
  Filled 2015-01-19 (×3): qty 5

## 2015-01-19 MED ORDER — ASPIRIN 81 MG PO CHEW: 324.0000 mg | CHEWABLE_TABLET | Freq: Every day | ORAL | Status: DC

## 2015-01-19 MED ORDER — SODIUM CHLORIDE 0.9 % IV SOLN
250.0000 mL | INTRAVENOUS | Status: DC
  Administered 2015-01-20 – 2015-01-28 (×2): 250 mL via INTRAVENOUS

## 2015-01-19 MED ORDER — ONDANSETRON HCL 4 MG/2ML IJ SOLN
4.0000 mg | Freq: Four times a day (QID) | INTRAMUSCULAR | Status: DC | PRN
  Administered 2015-01-25 – 2015-02-09 (×3): 4 mg via INTRAVENOUS
  Filled 2015-01-19 (×4): qty 2

## 2015-01-19 MED ORDER — SODIUM CHLORIDE 0.9 % IV SOLN
Freq: Once | INTRAVENOUS | Status: AC
  Administered 2015-01-19: 19:00:00 via INTRAVENOUS

## 2015-01-19 MED ORDER — CALCIUM CHLORIDE 10 % IV SOLN
INTRAVENOUS | Status: DC | PRN
  Administered 2015-01-19 (×4): 250 mg via INTRAVENOUS

## 2015-01-19 MED ORDER — SODIUM CHLORIDE 0.9 % IV SOLN
10.0000 g | INTRAVENOUS | Status: DC | PRN
  Administered 2015-01-19: 5 g/h via INTRAVENOUS

## 2015-01-19 MED ORDER — LACTATED RINGERS IV SOLN
INTRAVENOUS | Status: DC | PRN
  Administered 2015-01-19 (×2): via INTRAVENOUS

## 2015-01-19 MED ORDER — METOPROLOL TARTRATE 12.5 MG HALF TABLET
12.5000 mg | ORAL_TABLET | Freq: Two times a day (BID) | ORAL | Status: DC
  Filled 2015-01-19 (×3): qty 1

## 2015-01-19 MED ORDER — PROPOFOL 10 MG/ML IV BOLUS
INTRAVENOUS | Status: AC
  Filled 2015-01-19: qty 20

## 2015-01-19 MED ORDER — ALBUMIN HUMAN 5 % IV SOLN
INTRAVENOUS | Status: DC | PRN
  Administered 2015-01-19 (×2): via INTRAVENOUS

## 2015-01-19 MED ORDER — BISACODYL 10 MG RE SUPP
10.0000 mg | Freq: Every day | RECTAL | Status: DC
  Administered 2015-02-01 – 2015-02-08 (×5): 10 mg via RECTAL
  Filled 2015-01-19 (×5): qty 1

## 2015-01-19 MED ORDER — EPHEDRINE SULFATE 50 MG/ML IJ SOLN
INTRAMUSCULAR | Status: AC
  Filled 2015-01-19: qty 1

## 2015-01-19 MED ORDER — PANTOPRAZOLE SODIUM 40 MG PO TBEC
40.0000 mg | DELAYED_RELEASE_TABLET | Freq: Every day | ORAL | Status: DC
  Administered 2015-01-21 – 2015-01-31 (×11): 40 mg via ORAL
  Filled 2015-01-19 (×12): qty 1

## 2015-01-19 MED ORDER — SODIUM CHLORIDE 0.9 % IJ SOLN: 3.0000 mL | INTRAMUSCULAR | Status: DC | PRN

## 2015-01-19 MED ORDER — DOCUSATE SODIUM 100 MG PO CAPS
200.0000 mg | ORAL_CAPSULE | Freq: Every day | ORAL | Status: DC
  Administered 2015-01-20 – 2015-01-29 (×6): 200 mg via ORAL
  Filled 2015-01-19 (×7): qty 2

## 2015-01-19 MED ORDER — MILRINONE IN DEXTROSE 20 MG/100ML IV SOLN
0.3000 ug/kg/min | INTRAVENOUS | Status: DC
  Administered 2015-01-19 – 2015-01-20 (×2): 0.3 ug/kg/min via INTRAVENOUS
  Filled 2015-01-19 (×2): qty 100

## 2015-01-19 MED ORDER — MIDAZOLAM HCL 2 MG/2ML IJ SOLN: 2.0000 mg | INTRAMUSCULAR | Status: DC | PRN

## 2015-01-19 MED ORDER — PHENYLEPHRINE HCL 10 MG/ML IJ SOLN
0.0000 ug/min | INTRAMUSCULAR | Status: DC
  Administered 2015-01-19 (×2): 70 ug/min via INTRAVENOUS
  Administered 2015-01-20: 50 ug/min via INTRAVENOUS
  Filled 2015-01-19 (×4): qty 2

## 2015-01-19 MED ORDER — HEPARIN SODIUM (PORCINE) 1000 UNIT/ML IJ SOLN
INTRAMUSCULAR | Status: DC | PRN
  Administered 2015-01-19: 34000 [IU] via INTRAVENOUS

## 2015-01-19 MED ORDER — ROCURONIUM BROMIDE 50 MG/5ML IV SOLN
INTRAVENOUS | Status: AC
  Filled 2015-01-19: qty 1

## 2015-01-19 MED ORDER — LACTATED RINGERS IV SOLN
500.0000 mL | Freq: Once | INTRAVENOUS | Status: AC | PRN
Start: 2015-01-19 — End: 2015-01-19

## 2015-01-19 MED ORDER — SODIUM CHLORIDE 0.9 % IR SOLN
Status: DC | PRN
  Administered 2015-01-19: 3000 mL

## 2015-01-19 MED ORDER — INSULIN ASPART 100 UNIT/ML ~~LOC~~ SOLN
0.0000 [IU] | SUBCUTANEOUS | Status: DC
  Administered 2015-01-19 – 2015-01-20 (×7): 2 [IU] via SUBCUTANEOUS

## 2015-01-19 MED ORDER — TRAMADOL HCL 50 MG PO TABS
50.0000 mg | ORAL_TABLET | ORAL | Status: DC | PRN
  Administered 2015-01-20 – 2015-01-25 (×5): 100 mg via ORAL
  Administered 2015-01-26 – 2015-01-28 (×4): 50 mg via ORAL
  Administered 2015-01-28: 100 mg via ORAL
  Administered 2015-01-28: 50 mg via ORAL
  Administered 2015-01-29: 100 mg via ORAL
  Administered 2015-01-29: 50 mg via ORAL
  Administered 2015-01-29: 100 mg via ORAL
  Administered 2015-01-30: 50 mg via ORAL
  Filled 2015-01-19 (×3): qty 2
  Filled 2015-01-19: qty 1
  Filled 2015-01-19 (×4): qty 2
  Filled 2015-01-19: qty 1
  Filled 2015-01-19: qty 2
  Filled 2015-01-19 (×5): qty 1

## 2015-01-19 MED ORDER — INSULIN REGULAR BOLUS VIA INFUSION
0.0000 [IU] | Freq: Three times a day (TID) | INTRAVENOUS | Status: DC
  Filled 2015-01-19: qty 10

## 2015-01-19 MED ORDER — VANCOMYCIN HCL IN DEXTROSE 1-5 GM/200ML-% IV SOLN: 1000.0000 mg | Freq: Once | INTRAVENOUS | Status: DC

## 2015-01-19 MED ORDER — ALBUMIN HUMAN 5 % IV SOLN
250.0000 mL | INTRAVENOUS | Status: AC | PRN
  Administered 2015-01-19 – 2015-01-20 (×4): 250 mL via INTRAVENOUS
  Filled 2015-01-19 (×2): qty 250

## 2015-01-19 MED ORDER — DEXMEDETOMIDINE HCL IN NACL 400 MCG/100ML IV SOLN
0.4000 ug/kg/h | INTRAVENOUS | Status: DC
  Filled 2015-01-19: qty 100

## 2015-01-19 MED ORDER — PROTAMINE SULFATE 10 MG/ML IV SOLN
INTRAVENOUS | Status: DC | PRN
  Administered 2015-01-19: 300 mg via INTRAVENOUS

## 2015-01-19 MED ORDER — ACETAMINOPHEN 650 MG RE SUPP
650.0000 mg | Freq: Once | RECTAL | Status: AC
  Administered 2015-01-19: 650 mg via RECTAL

## 2015-01-19 MED ORDER — SODIUM CHLORIDE 0.9 % IV SOLN
250.0000 [IU] | INTRAVENOUS | Status: DC | PRN
  Administered 2015-01-19: 1.1 [IU]/h via INTRAVENOUS

## 2015-01-19 MED ORDER — ASPIRIN EC 325 MG PO TBEC
325.0000 mg | DELAYED_RELEASE_TABLET | Freq: Every day | ORAL | Status: DC
  Administered 2015-01-20: 325 mg via ORAL
  Filled 2015-01-19 (×2): qty 1

## 2015-01-19 MED ORDER — POTASSIUM CHLORIDE 10 MEQ/50ML IV SOLN: 10.0000 meq | INTRAVENOUS | Status: AC

## 2015-01-19 MED ORDER — HEMOSTATIC AGENTS (NO CHARGE) OPTIME
TOPICAL | Status: DC | PRN
  Administered 2015-01-19: 1 via TOPICAL

## 2015-01-19 MED ORDER — 0.9 % SODIUM CHLORIDE (POUR BTL) OPTIME
TOPICAL | Status: DC | PRN
  Administered 2015-01-19: 2000 mL

## 2015-01-19 MED ORDER — LEVOFLOXACIN IN D5W 500 MG/100ML IV SOLN
500.0000 mg | INTRAVENOUS | Status: DC
  Filled 2015-01-19: qty 100

## 2015-01-19 MED ORDER — DOPAMINE-DEXTROSE 3.2-5 MG/ML-% IV SOLN: 0.0000 ug/kg/min | INTRAVENOUS | Status: DC

## 2015-01-19 MED ORDER — ACETAMINOPHEN 160 MG/5ML PO SOLN: 650.0000 mg | Freq: Once | ORAL | Status: AC

## 2015-01-19 MED ORDER — SODIUM CHLORIDE 0.9 % IJ SOLN
INTRAMUSCULAR | Status: AC
  Filled 2015-01-19: qty 20

## 2015-01-19 MED ORDER — PHENYLEPHRINE 40 MCG/ML (10ML) SYRINGE FOR IV PUSH (FOR BLOOD PRESSURE SUPPORT)
PREFILLED_SYRINGE | INTRAVENOUS | Status: AC
  Filled 2015-01-19: qty 10

## 2015-01-19 MED ORDER — MIDAZOLAM HCL 5 MG/5ML IJ SOLN
INTRAMUSCULAR | Status: DC | PRN
  Administered 2015-01-19: 1 mg via INTRAVENOUS
  Administered 2015-01-19: 4 mg via INTRAVENOUS
  Administered 2015-01-19: 1 mg via INTRAVENOUS
  Administered 2015-01-19: 4 mg via INTRAVENOUS
  Administered 2015-01-19: 2 mg via INTRAVENOUS
  Administered 2015-01-19: 3 mg via INTRAVENOUS

## 2015-01-19 MED ORDER — DEXTROSE 5 % IV SOLN: 1.5000 g | Freq: Two times a day (BID) | INTRAVENOUS | Status: DC

## 2015-01-19 MED ORDER — PROPOFOL INFUSION 10 MG/ML OPTIME
INTRAVENOUS | Status: DC | PRN
  Administered 2015-01-19: 125 ug/kg/min via INTRAVENOUS
  Administered 2015-01-19: 75 ug/kg/min via INTRAVENOUS

## 2015-01-19 MED ORDER — HEMOSTATIC AGENTS (NO CHARGE) OPTIME
TOPICAL | Status: DC | PRN
Start: 2015-01-19 — End: 2015-01-19
  Administered 2015-01-19: 3 via TOPICAL

## 2015-01-19 MED ORDER — DEXMEDETOMIDINE HCL IN NACL 200 MCG/50ML IV SOLN
0.0000 ug/kg/h | INTRAVENOUS | Status: DC
  Administered 2015-01-19: 0.5 ug/kg/h via INTRAVENOUS
  Administered 2015-01-20: 0.6 ug/kg/h via INTRAVENOUS
  Filled 2015-01-19 (×2): qty 50

## 2015-01-19 MED ORDER — LEVOFLOXACIN IN D5W 500 MG/100ML IV SOLN
INTRAVENOUS | Status: DC | PRN
  Administered 2015-01-19: 500 mg via INTRAVENOUS

## 2015-01-19 MED ORDER — LEVOFLOXACIN IN D5W 500 MG/100ML IV SOLN
500.0000 mg | INTRAVENOUS | Status: AC
  Administered 2015-01-20: 500 mg via INTRAVENOUS
  Filled 2015-01-19: qty 100

## 2015-01-19 SURGICAL SUPPLY — 124 items
ADAPTER CARDIO PERF ANTE/RETRO (ADAPTER) ×3 IMPLANT
ATTRACTOMAT 16X20 MAGNETIC DRP (DRAPES) ×3 IMPLANT
BAG DECANTER FOR FLEXI CONT (MISCELLANEOUS) ×3 IMPLANT
BLADE STERNUM SYSTEM 6 (BLADE) ×3 IMPLANT
BLADE SURG 11 STRL SS (BLADE) ×3 IMPLANT
CANISTER SUCTION 2500CC (MISCELLANEOUS) ×3 IMPLANT
CANN PRFSN 3/8X14X24FR PCFC (MISCELLANEOUS)
CANN PRFSN 3/8XCNCT ST RT ANG (MISCELLANEOUS)
CANNULA EZ GLIDE AORTIC 21FR (CANNULA) IMPLANT
CANNULA GUNDRY RCSP 15FR (MISCELLANEOUS) ×6 IMPLANT
CANNULA PRFSN 3/8X14X24FR PCFC (MISCELLANEOUS) IMPLANT
CANNULA PRFSN 3/8XCNCT RT ANG (MISCELLANEOUS) IMPLANT
CANNULA SOFTFLOW AORTIC 7M21FR (CANNULA) ×3 IMPLANT
CANNULA VEN MTL TIP RT (MISCELLANEOUS)
CANNULA VENNOUS METAL TIP 20FR (CANNULA) ×3 IMPLANT
CATH CPB KIT OWEN (MISCELLANEOUS) ×3 IMPLANT
CATH HEART VENT LEFT (CATHETERS) ×2 IMPLANT
CATH ROBINSON RED A/P 18FR (CATHETERS) ×3 IMPLANT
CATH THORACIC 28FR RT ANG (CATHETERS) IMPLANT
CATH THORACIC 36FR (CATHETERS) IMPLANT
CATH THORACIC 36FR RT ANG (CATHETERS) ×3 IMPLANT
CLAMP ISOLATOR SYNERGY LG (MISCELLANEOUS) ×3 IMPLANT
CLIP FOGARTY SPRING 6M (CLIP) IMPLANT
CONN 1/2X1/2X1/2  BEN (MISCELLANEOUS)
CONN 1/2X1/2X1/2 BEN (MISCELLANEOUS) IMPLANT
CONN 3/8X1/2 ST GISH (MISCELLANEOUS) ×6 IMPLANT
CONNECTOR 1/2X3/8X1/2 3 WAY (MISCELLANEOUS) ×1
CONNECTOR 1/2X3/8X1/2 3WAY (MISCELLANEOUS) ×2 IMPLANT
COVER SURGICAL LIGHT HANDLE (MISCELLANEOUS) ×3 IMPLANT
CRADLE DONUT ADULT HEAD (MISCELLANEOUS) ×3 IMPLANT
DEVICE SUT CK QUICK LOAD INDV (Prosthesis & Implant Heart) ×12 IMPLANT
DEVICE SUT CK QUICK LOAD MINI (Prosthesis & Implant Heart) ×3 IMPLANT
DRAIN CHANNEL 32F RND 10.7 FF (WOUND CARE) ×3 IMPLANT
DRAPE BILATERAL SPLIT (DRAPES) ×3 IMPLANT
DRAPE CARDIOVASCULAR INCISE (DRAPES)
DRAPE CV SPLIT W-CLR ANES SCRN (DRAPES) ×3 IMPLANT
DRAPE INCISE IOBAN 66X45 STRL (DRAPES) ×3 IMPLANT
DRAPE SLUSH/WARMER DISC (DRAPES) ×3 IMPLANT
DRAPE SRG 135X102X78XABS (DRAPES) IMPLANT
DRSG AQUACEL AG ADV 3.5X14 (GAUZE/BANDAGES/DRESSINGS) ×3 IMPLANT
DRSG COVADERM 4X14 (GAUZE/BANDAGES/DRESSINGS) ×3 IMPLANT
ELECT BLADE 4.0 EZ CLEAN MEGAD (MISCELLANEOUS) ×3
ELECT REM PT RETURN 9FT ADLT (ELECTROSURGICAL) ×12
ELECTRODE BLDE 4.0 EZ CLN MEGD (MISCELLANEOUS) ×2 IMPLANT
ELECTRODE REM PT RTRN 9FT ADLT (ELECTROSURGICAL) ×8 IMPLANT
FEMORAL VENOUS CANN RAP (CANNULA) ×3 IMPLANT
GAUZE SPONGE 4X4 12PLY STRL (GAUZE/BANDAGES/DRESSINGS) ×9 IMPLANT
GLOVE BIO SURGEON STRL SZ 6 (GLOVE) IMPLANT
GLOVE BIO SURGEON STRL SZ 6.5 (GLOVE) IMPLANT
GLOVE BIO SURGEON STRL SZ7 (GLOVE) IMPLANT
GLOVE BIO SURGEON STRL SZ7.5 (GLOVE) IMPLANT
GLOVE ORTHO TXT STRL SZ7.5 (GLOVE) ×9 IMPLANT
GOWN STRL REUS W/ TWL LRG LVL3 (GOWN DISPOSABLE) ×12 IMPLANT
GOWN STRL REUS W/TWL LRG LVL3 (GOWN DISPOSABLE) ×6
HEMOSTAT POWDER SURGIFOAM 1G (HEMOSTASIS) ×18 IMPLANT
INSERT FOGARTY XLG (MISCELLANEOUS) ×6 IMPLANT
KIT BASIN OR (CUSTOM PROCEDURE TRAY) ×3 IMPLANT
KIT DEVICE SUT COR-KNOT MIS 5 (INSTRUMENTS) ×3 IMPLANT
KIT DILATOR VASC 18G NDL (KITS) ×3 IMPLANT
KIT DRAINAGE VACCUM ASSIST (KITS) ×3 IMPLANT
KIT ROOM TURNOVER OR (KITS) ×3 IMPLANT
KIT SUCTION CATH 14FR (SUCTIONS) ×12 IMPLANT
KIT SUT CK MINI COMBO 4X17 (Prosthesis & Implant Heart) ×3 IMPLANT
LEAD PACING MYOCARDI (MISCELLANEOUS) ×3 IMPLANT
LINE VENT (MISCELLANEOUS) ×3 IMPLANT
LOOP VESSEL SUPERMAXI WHITE (MISCELLANEOUS) ×6 IMPLANT
NS IRRIG 1000ML POUR BTL (IV SOLUTION) ×15 IMPLANT
PACK OPEN HEART (CUSTOM PROCEDURE TRAY) ×6 IMPLANT
PACK TRANSFER 300ML (TOM) (MISCELLANEOUS) ×15 IMPLANT
PAD ARMBOARD 7.5X6 YLW CONV (MISCELLANEOUS) ×9 IMPLANT
PROBE CRYO2-ABLATION MALLABLE (MISCELLANEOUS) ×3 IMPLANT
SEALANT SURG COSEAL 8ML (VASCULAR PRODUCTS) ×3 IMPLANT
SET CARDIOPLEGIA MPS 5001102 (MISCELLANEOUS) ×3 IMPLANT
SET IRRIG TUBING LAPAROSCOPIC (IRRIGATION / IRRIGATOR) ×3 IMPLANT
SET Y EXTENSION LINE CSP (IV SETS) ×3 IMPLANT
SOLUTION ANTI FOG 6CC (MISCELLANEOUS) ×3 IMPLANT
SPONGE GAUZE 4X4 12PLY STER LF (GAUZE/BANDAGES/DRESSINGS) ×3 IMPLANT
STRIP PERIGUARD 6X8 (Vascular Products) ×3 IMPLANT
SUCKER INTRACARDIAC WEIGHTED (SUCKER) ×3 IMPLANT
SUT BONE WAX W31G (SUTURE) ×6 IMPLANT
SUT ETHIBON 2 0 V 52N 30 (SUTURE) ×6 IMPLANT
SUT ETHIBON EXCEL 2-0 V-5 (SUTURE) ×33 IMPLANT
SUT ETHIBOND 2 0 SH (SUTURE)
SUT ETHIBOND 2 0 SH 36X2 (SUTURE) IMPLANT
SUT ETHIBOND 2 0 V4 (SUTURE) IMPLANT
SUT ETHIBOND 2 0V4 GREEN (SUTURE) IMPLANT
SUT ETHIBOND 4 0 RB 1 (SUTURE) IMPLANT
SUT ETHIBOND V-5 VALVE (SUTURE) ×33 IMPLANT
SUT ETHIBOND X763 2 0 SH 1 (SUTURE) ×15 IMPLANT
SUT MNCRL AB 3-0 PS2 18 (SUTURE) ×12 IMPLANT
SUT PDS AB 1 CTX 36 (SUTURE) ×12 IMPLANT
SUT PROLENE 3 0 SH 1 (SUTURE) ×15 IMPLANT
SUT PROLENE 3 0 SH DA (SUTURE) ×12 IMPLANT
SUT PROLENE 4 0 RB 1 (SUTURE) ×17
SUT PROLENE 4 0 SH DA (SUTURE) ×3 IMPLANT
SUT PROLENE 4-0 RB1 .5 CRCL 36 (SUTURE) ×34 IMPLANT
SUT PROLENE 5 0 C 1 36 (SUTURE) IMPLANT
SUT PROLENE 6 0 C 1 30 (SUTURE) ×12 IMPLANT
SUT SILK  1 MH (SUTURE) ×2
SUT SILK 1 MH (SUTURE) ×4 IMPLANT
SUT SILK 2 0 SH CR/8 (SUTURE) IMPLANT
SUT SILK 3 0 SH CR/8 (SUTURE) IMPLANT
SUT STEEL 6MS V (SUTURE) IMPLANT
SUT STEEL STERNAL CCS#1 18IN (SUTURE) IMPLANT
SUT STEEL SZ 6 DBL 3X14 BALL (SUTURE) IMPLANT
SUT VIC AB 2-0 CTX 27 (SUTURE) IMPLANT
SUTURE E-PAK OPEN HEART (SUTURE) ×3 IMPLANT
SYS ARTICLIP LAA EXCLUSION 140 (Clip) ×3 IMPLANT
SYS ATRICLIP LAA EXCLUSION 45 (CLIP) IMPLANT
SYSTEM SAHARA CHEST DRAIN ATS (WOUND CARE) ×3 IMPLANT
TAPE CLOTH SOFT 2X10 (GAUZE/BANDAGES/DRESSINGS) ×3 IMPLANT
TOWEL OR 17X24 6PK STRL BLUE (TOWEL DISPOSABLE) ×3 IMPLANT
TOWEL OR 17X26 10 PK STRL BLUE (TOWEL DISPOSABLE) ×3 IMPLANT
TRANSDUCER COBE DISPOSABLE (MISCELLANEOUS) ×3 IMPLANT
TRAY FOLEY IC TEMP SENS 14FR (CATHETERS) ×3 IMPLANT
TRAY FOLEY IC TEMP SENS 16FR (CATHETERS) ×3 IMPLANT
TUBE CONNECTING 12X1/4 (SUCTIONS) ×3 IMPLANT
TUBING MEDICAL 3X8X3X32 (MISCELLANEOUS) ×3 IMPLANT
TUBING PVC 1/4X1/16 WALL 8 (MISCELLANEOUS) ×12 IMPLANT
UNDERPAD 30X30 INCONTINENT (UNDERPADS AND DIAPERS) ×3 IMPLANT
VALVE MAGNA EASE AORTIC 23MM (Prosthesis & Implant Heart) ×3 IMPLANT
VENT LEFT HEART 12002 (CATHETERS) ×3
WATER STERILE IRR 1000ML POUR (IV SOLUTION) ×6 IMPLANT
YANKAUER SUCT BULB TIP NO VENT (SUCTIONS) ×3 IMPLANT

## 2015-01-19 NOTE — Progress Notes (Signed)
Echocardiogram Echocardiogram Transesophageal has been performed.  Sandra Turner 01/19/2015, 9:20 AM 

## 2015-01-19 NOTE — Anesthesia Procedure Notes (Signed)
Procedure Name: Intubation Performed by: Coralee RudFLORES, Sagal Gayton Pre-anesthesia Checklist: Patient identified, Timeout performed, Emergency Drugs available, Suction available and Patient being monitored Patient Re-evaluated:Patient Re-evaluated prior to inductionOxygen Delivery Method: Circle system utilized Preoxygenation: Pre-oxygenation with 100% oxygen Intubation Type: IV induction Ventilation: Oral airway inserted - appropriate to patient size and Mask ventilation without difficulty Laryngoscope Size: Mac and 3 Grade View: Grade III Tube type: Subglottic suction tube Tube size: 7.0 mm Number of attempts: 1 Airway Equipment and Method: Patient positioned with wedge pillow and Stylet Placement Confirmation: ETT inserted through vocal cords under direct vision,  breath sounds checked- equal and bilateral,  positive ETCO2 and CO2 detector Secured at: 21 cm Tube secured with: Tape Dental Injury: Teeth and Oropharynx as per pre-operative assessment

## 2015-01-19 NOTE — Progress Notes (Signed)
Ordered to transfuse 1 unit of platelets, 2 units of PRBC and 2 units of FFP by Dr. Cornelius Moraswen. Will continue to monitor.  Jacqulynn CadetPotter, Alisan Dokes A

## 2015-01-19 NOTE — Progress Notes (Signed)
RT note, PIP's elevated, decreased VT to 450 and increased RR to 14, PIP's now 26-28. RN notified

## 2015-01-19 NOTE — CV Procedure (Signed)
Intra-operative Transesophageal Echocardiography Report:  Sandra Turner is a 74 year old femCheyenne Turner with severe aortic stenosis and atrial fibrillation who was hospitalized in January, 1016 with shortness of breath and a presumed COPD exacerbation. Since then she has had symptoms of exertional shortness of breath. Cardiac catheterization on 10/22/14 showed no evidence of CAD. She is now scheduled to undergo aortic valve replacement and Maze procedure by Dr. Cornelius Moraswen. Intra-operative transesophageal echocardiography was requested to evaluate the aortic valve, to assess for any other valvular pathology, to serve as a monitor for left and right ventricular function, and to serve as a monitor for intraoperative volume status. The patient was brought to the operating room at [facility] and general anesthesia was induced without difficulty.  Following  orogastric suctioning and endotracheal intubation, the transesophageal echocardiography probe was inserted in the esophagus without difficulty.   Impression: Pre-bypass findings:  1. Aortic valve: The aortic valve was trileaflet. There was severe restriction to opening. There was severe thickening and calcification of the leaflets noted. There was no aortic insufficiency. Continuous-wave and Doppler interrogation of the aortic outflow revealed peak velocity of 3.7 m/s with a mean velocity mean gradient of 33 mmHg and a max peak instantaneous gradient of 55 mmHg. Aortic valve area was 0.64 cm using the VTI method and 0.63 cm using the V-max method. The aortic annulus measured 2.15 cm in diameter.  2. Mitral valve: There was moderate to severe mitral annular calcification involving both leaflets. There was 1+ mitral insufficiency. There were no prolapsing or flail segments noted. There appeared to be no restriction to opening of the mitral valve.  3. Left ventricle: The left ventricular cavity was at the upper limits of normal in size. Left ventricular  end-diastolic diameter measured 53 mm at the mid-papillary level in the transgastric short axis view. Left ventricular wall thickness measured 1.06 cm at the posterior wall 1.03 cm at the anterior wall at end diastole in the mid-papillary level. There was mild global left ventricular hypokinesis. The ejection fraction was estimated 50%. There were no regional wall motion abnormalities. No thrombus was noted in the left ventricular apex.  4. Right ventricle: The right ventricular cavity was mildly enlarged. There was  reduced right ventricular function. There was reduced contractility of the basilar segment of the right ventricular free wall but normal appearing contractility in the infundibular region.  5. Tricuspid valve: Tricuspid valve appeared structurally unremarkable. There was trace tricuspid insufficiency.  6.. Interatrial septum. Intra-atrial septum appeared to be intact without evidence of patent foramen ovale or atrial septal defect by color Doppler.  7.. Left atrium: Left atrial cavity was enlarged and measured 7.12 cm in the superior inferior dimension and 6.49 cm in the mediolateral dimension. There is no thrombus noted within the left atrium or left atrial appendage.  8. Ascending aorta: There was a well-defined aortic root and sinotubular ridge. There was mild thickening of the walls of the ascending aorta. The proximal ascending aorta measured 2.38 cm. The aortic root at the sinuses of Valsalva measured 2.75 cm and the aorta at the sinotubular junction measured 2.45 cm.  Post-bypass findings:  1. Aortic valve: There was a bioprosthetic valve in the aortic position. The leaflets appeared to open normally. There was no intra-valvular or perivalvular aortic insufficiency. The mean transaortic gradient was 7 mmHg.  2. Ascending aorta: The proximal ascending aorta appeared thickened and imaging was difficult due to acoustic shadowing.  3. Mitral valve: The mitral valve appeared  unchanged from the pre-bypass study.  There was moderate to severe mitral annular calcification involving both leaflets. There was trace to 1+ mitral insufficiency.  4. Left ventricle: There was a moderate amount of air noted within the left ventricular cavity upon separation cardiopulmonary bypass. This resolved over the next 15-20 minutes. When the transesophageal echocardiography probe was removed no air could be appreciated within the left ventricle. There was mild diffuse global left ventricular hypokinesis with the ejection fraction was estimated at 50%.  5. Right ventricle: The right ventricular cavity again appeared mildly enlarged and there was some reduced contractility of the right ventricle free wall especially in the basilar region. This appeared to improve in the post-bypass period  Queen Blossomavid Hisao Doo M.D.

## 2015-01-19 NOTE — Anesthesia Preprocedure Evaluation (Addendum)
Anesthesia Evaluation  Patient identified by MRN, date of birth, ID band Patient awake    Reviewed: Allergy & Precautions, NPO status , Patient's Chart, lab work & pertinent test results  History of Anesthesia Complications (+) MALIGNANT HYPERTHERMIA, Family history of anesthesia reaction and history of anesthetic complications  Airway Mallampati: II  TM Distance: >3 FB Neck ROM: Full    Dental  (+) Teeth Intact, Dental Advisory Given   Pulmonary  breath sounds clear to auscultation        Cardiovascular hypertension, + DOE + dysrhythmias Atrial Fibrillation Rhythm:Irregular Rate:Normal + Systolic murmurs    Neuro/Psych negative neurological ROS  negative psych ROS   GI/Hepatic negative GI ROS, Neg liver ROS,   Endo/Other  Morbid obesity  Renal/GU negative Renal ROS  negative genitourinary   Musculoskeletal  (+) Arthritis -,   Abdominal (+) + obese,  Abdomen: soft.    Peds  Hematology negative hematology ROS (+)   Anesthesia Other Findings   Reproductive/Obstetrics negative OB ROS                            Anesthesia Physical Anesthesia Plan  ASA: III  Anesthesia Plan: General   Post-op Pain Management:    Induction: Intravenous  Airway Management Planned: Oral ETT  Additional Equipment: Arterial line, CVP, PA Cath, 3D TEE and Ultrasound Guidance Line Placement  Intra-op Plan:   Post-operative Plan: Post-operative intubation/ventilation  Informed Consent: I have reviewed the patients History and Physical, chart, labs and discussed the procedure including the risks, benefits and alternatives for the proposed anesthesia with the patient or authorized representative who has indicated his/her understanding and acceptance.   Dental advisory given  Plan Discussed with: CRNA and Anesthesiologist  Anesthesia Plan Comments:         Anesthesia Quick Evaluation

## 2015-01-19 NOTE — Interval H&P Note (Signed)
History and Physical Interval Note:  01/19/2015 6:43 AM  Sandra Turner  has presented today for surgery, with the diagnosis of SEVERE AS A-FIB  The various methods of treatment have been discussed with the patient and family. After consideration of risks, benefits and other options for treatment, the patient has consented to  Procedure(s): AORTIC VALVE REPLACEMENT (AVR) (N/A) MAZE (N/A) POSSIBLE AORTIC ROOT ENLARGEMENT OR ROOT REPLACEMENT (N/A) TRANSESOPHAGEAL ECHOCARDIOGRAM (TEE) (N/A) as a surgical intervention .  The patient's history has been reviewed, patient examined, no change in status, stable for surgery.  I have reviewed the patient's chart and labs.  Questions were answered to the patient's satisfaction.     Purcell Nailslarence H Owen

## 2015-01-19 NOTE — Progress Notes (Signed)
Patient ID: Cheyenne AdasRita Faulstich, female   DOB: Dec 20, 1940, 74 y.o.   MRN: [id number] EVENING ROUNDS NOTE :     [address]       [place] [id number]             [phone]                 Day of Surgery Procedure(s) (LRB): AORTIC VALVE REPLACEMENT (AVR) (N/A) MAZE (N/A)  AORTIC ROOT ENLARGEMENT (N/A) TRANSESOPHAGEAL ECHOCARDIOGRAM (TEE) (N/A)  Total Length of Stay:  LOS: 0 days  BP 80/52 mmHg  Pulse 84  Temp(Src) 97.3 F (36.3 C) (Core (Comment))  Resp 14  Wt 247 lb (112.038 kg)  SpO2 100% Body mass index is 45.17 kg/(m^2). Marland Kitchen.Intake/Output      05/17 0701 - 05/18 0700 05/18 0701 - 05/19 0700   I.V. (mL/kg)  2827.4 (25.2)   Blood  2229   IV Piggyback  900   Total Intake(mL/kg)  5956.4 (53.2)   Urine (mL/kg/hr)  1125 (0.9)   Blood  2100 (1.6)   Chest Tube  250 (0.2)   Total Output   3475   Net   +2481.4          . sodium chloride 20 mL/hr at 01/19/15 1800  . [START ON 01/20/2015] sodium chloride    . sodium chloride 20 mL/hr at 01/19/15 1800  . sodium chloride 100 mL/hr at 01/19/15 1800  . dexmedetomidine 0.5 mcg/kg/hr (01/19/15 1800)  . DOPamine 3 mcg/kg/min (01/19/15 1800)  . insulin (NOVOLIN-R) infusion 0.5 Units/hr (01/19/15 1800)  . lactated ringers    . lactated ringers 20 mL/hr at 01/19/15 1800  . milrinone 0.3 mcg/kg/min (01/19/15 1800)  . nitroGLYCERIN Stopped (01/19/15 1645)  . phenylephrine (NEO-SYNEPHRINE) Adult infusion 70 mcg/min (01/19/15 1820)     Lab Results  Component Value Date   WBC 10.0 01/19/2015   HGB 8.8* 01/19/2015   HCT 26.0* 01/19/2015   PLT 147* 01/19/2015   GLUCOSE 98 01/19/2015   ALT 16 01/17/2015   AST 31 01/17/2015   NA 141 01/19/2015   K 4.0 01/19/2015   CL 101 01/19/2015   CREATININE 0.60 01/19/2015   BUN 14 01/19/2015   CO2 27 01/17/2015   TSH 0.916 08/22/2012   INR 1.68* 01/19/2015   HGBA1C 5.3 01/17/2015   Sedated on vent early post op 300 ml from chest tubes over 2 1/5 hours. blood products have been  ordered   Delight OvensEdward B Carling Liberman MD  Beeper [phone] Office [phone] 01/19/2015 6:35 PM

## 2015-01-19 NOTE — Transfer of Care (Signed)
Immediate Anesthesia Transfer of Care Note  Patient: Sandra Turner  Procedure(s) Performed: Procedure(s): AORTIC VALVE REPLACEMENT (AVR) (N/A) MAZE (N/A)  AORTIC ROOT ENLARGEMENT (N/A) TRANSESOPHAGEAL ECHOCARDIOGRAM (TEE) (N/A)  Patient Location: SICU  Anesthesia Type:General  Level of Consciousness: Patient remains intubated per anesthesia plan  Airway & Oxygen Therapy: Patient remains intubated per anesthesia plan and Patient placed on Ventilator (see vital sign flow sheet for setting)  Post-op Assessment: Report given to RN  Post vital signs: Reviewed and stable  Last Vitals:  Filed Vitals:   01/19/15 0811  BP:   Pulse:   Temp:   Resp: 13    Complications: No apparent anesthesia complications

## 2015-01-19 NOTE — Brief Op Note (Addendum)
01/19/2015  1:52 PM  PATIENT:  Sandra Turner  74 y.o. female  PRE-OPERATIVE DIAGNOSIS:  1. SEVERE AS 2. A-FIB  POST-OPERATIVE DIAGNOSIS:  1. SEVERE AS 2. A-FIB  PROCEDURE:  TRANSESOPHAGEAL ECHOCARDIOGRAM (TEE) ,MEDIAN STERNOTOMY for AORTIC VALVE REPLACEMENT (AVR) (using a Pericardial Tissue Valve, size 23), BIPOLAR and [facility] CRYO MAZE,  AORTIC ROOT ENLARGEMENT (using a Peri-guard pericardial patch)   SURGEON:    Purcell Nailslarence H Owen, MD  ASSISTANTS:  Ardelle Ballsonielle M Zimmerman, PA-C  ANESTHESIA:   Kipp Broodavid Joslin, MD  CROSSCLAMP TIME:   168'  CARDIOPULMONARY BYPASS TIME: 222'  FINDINGS:  Severe calcific aortic stenosis  Normal LV systolic function   Mild mitral regurgitation  Moderate pulmonary hypertension  Diffuse adhesions throughout the pericardial space c/w remote history of pericarditis   Aortic Valve Etiology   Aortic Insufficiency:  Trivial/Trace  Aortic Valve Disease:  Yes.  Aortic Stenosis:  Yes. Smallest Aortic Valve Area: 0.87cm2; Highest Mean Gradient: 75mmHg.  Etiology (Choose at least one and up to  5 etiologies):  Degenerative - Calcified  Aortic Valve  Procedure Performed:  Replacement: Yes.  Bioprosthetic Valve. Implant Model Number:[id number], Size:23 mm, Unique Device Identifier:[id number]  Repair/Reconstruction: No.   Aortic Annular Enlargement: Yes.  Maze Procedure  Radiofrequency:  Yes.  Bipolar: Yes.  Cut-and-sew:  No.  Cryo: Yes  Lesions (select all that apply):    1   Pulmonary Vein Isolation,    2   Box Lesion,   3a  Inferior Pulmonary Vein Connecting Lesion,   3b  Superior Pulmonary Vein Connecting Lesion,     4  Posterior Mirtal Annular Line,     6  Mitral Valve Cryo Lesion,     7  LAA Ligation/Removal,     9   Intercaval Line to Tricuspid Annulus ("T" Lesion),    11  Intercaval Line,    13  Tricuspid Cryo Lesion,   15a  RAA Lateral Wall (Short) and   15b  RAA Lateral Wall to "T" Lesion   COMPLICATIONS: None  BASELINE WEIGHT: 112  kg  PATIENT DISPOSITION:   TO SICU IN STABLE CONDITION  Purcell NailsClarence H Owen 01/19/2015 3:50 PM

## 2015-01-19 NOTE — Op Note (Signed)
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 Cornelius Moraswen, MD  Assistant: Ardelle Ballsonielle M. Zimmerman, PA-C  Anesthesia: Kipp Broodavid Joslin, MD  Operative Findings:  Severe calcific aortic stenosis  Normal LV systolic function   Mild mitral regurgitation  Moderate pulmonary hypertension  Diffuse adhesions throughout the pericardial space c/w remote history of pericarditis          BRIEF CLINICAL NOTE AND INDICATIONS FOR SURGERY  Patient is a 74 year old obese white female from [place] referred to discuss treatment options for management of severe symptomatic aortic stenosis. The patient's cardiac history dates back to December 2013 when she was hospitalized acutely at [facility] with acute exacerbation of shortness of breath. She was found to be in atrial fibrillation and transthoracic echocardiogram demonstrated what was felt to be moderate aortic stenosis with signs of pulmonary hypertension. Left ventricular systolic function was normal. She was initially evaluated by Dr. Dietrich Patesothbart. The patient recalls that she was told she probably had pneumonia at that time. She refused anticoagulation using warfarin at that time. She has been followed ever since through the [facility] office in [place], most recently by Dr. Diona BrownerMcDowell. She has remained in atrial fibrillation, and recently she has been chronically  anticoagulated using Pradaxa. Follow-up echocardiograms have demonstrated progression of the severity of aortic stenosis. Echocardiogram performed 04/19/2014 revealed normal left ventricular systolic function with ejection fraction estimated 60-65% and severe aortic stenosis with mean transvalvular gradient estimated 56 mmHg. She was referred to the multidisciplinary heart valve clinic and was seen in consultation by Dr. Clifton JamesMcAlhany on 05/12/2014. At that time the patient was reluctant to proceed with any further diagnostic evaluation and plans were made for close follow-up on medical therapy. Follow-up echocardiogram performed 08/30/2014 revealed preserved left ventricular systolic function with ejection fraction estimated 55-60%. There remained severe to "critical" aortic stenosis with mean velocity across the aortic valve estimated 48 mmHg. The patient was hospitalized for several days in early January with progressive shortness of breath, low-grade fever, and productive cough. She was treated for presumed acute bronchitis and COPD exacerbation, and chest radiographs suggests the possibility of community acquired pneumonia. Symptoms reportedly improve with antibiotics. She was seen in follow-up in early February by Dr. Clifton JamesMcAlhany and agreed to proceed with left and right heart catheterization to further evaluate the severity of aortic stenosis. Catheterization confirmed the presence of aortic stenosis with peak-to-peak and mean transvalvular gradients measured 43 and 29 mmHg, respectively. The patient did not have any significant coronary artery disease. There was mild to moderate pulmonary hypertension. The patient has been referred for surgical consultation to discuss treatment options further. The patient has been seen in consultation and counseled at length regarding the indications, risks and potential benefits of surgery.  All questions have been answered, and the patient provides full informed consent  for the operation as described.    DETAILS OF THE OPERATIVE PROCEDURE  Preparation:  The patient is brought to the operating room on the above mentioned date and central monitoring was established  by the anesthesia team including placement of Swan-Ganz catheter and radial arterial line. There was moderate pulmonary hypertension.  The patient is placed in the supine position on the operating table.  Intravenous antibiotics are administered. General endotracheal anesthesia is induced uneventfully. Because of the patient's family history of malignant hyperthermia, no succinylcholine is utilized. Similarly, no inhalational gas anesthetic agents are utilized. In addition, prior to surgery the entire anesthesia ventilator circuit was cleaned and sterilized.  A Foley catheter is placed.  Baseline transesophageal echocardiogram was performed.  Findings were notable for severe aortic stenosis.  There was trace aortic insufficiency and mild mitral regurgitation. There was moderate to severe mitral annular calcification. There was normal left ventricular systolic function. There was moderate enlargement of the right ventricle.  The patient's chest, abdomen, both groins, and both lower extremities are prepared and draped in a sterile manner. A time out procedure is performed.   Surgical Approach:  A median sternotomy incision was performed and the pericardium is opened. There were dense adhesions throughout the pericardial space consistent with a remote history of pericarditis. Sharp dissection and electrocautery was utilized to free up the anterior surface of the aorta.  The ascending aorta is small in size but otherwise normal in appearance.    Extracorporeal Cardiopulmonary Bypass and Myocardial Protection:  The right common femoral vein is cannulated using the Seldinger technique and a guidewire advanced into the right atrium using TEE guidance.  The patient is heparinized systemically and the femoral  vein cannulated using a 22 Fr long femoral venous cannula.  The ascending aorta is cannulated for cardiopulmonary bypass.  Adequate heparinization is verified.     The entire pre-bypass portion of the operation was notable for stable hemodynamics.  Cardiopulmonary bypass was begun and sharp dissection and electrocautery was utilized to further divide the adhesions throughout the pericardial space.  A second venous cannula is placed directly into the superior vena cava.  Attempts to place a retrograde cardioplegia cannula through the right atrium into coronary sinus or unsuccessful because of relatively small size of the coronary sinus.   Subsequently umbilical loops are placed around the superior vena cava and the inferior vena cava and the femoral venous cannula is pulled down until the distal tip is just below the junction between the right atrium and the inferior vena cava.  The umbilical loops are secured and then incision is made in the right atrium. A retrograde cardioplegia cannula is placed directly into the coronary sinus under direct vision.  A cardioplegia cannula is placed in the ascending aorta.  A temperature probe was placed in the interventricular septum.  The patient is allowed to cool passively to [facility]32C systemic temperature.  The aortic cross clamp is applied and cold blood cardioplegia is delivered initially in an antegrade fashion through the aortic root.  Supplemental cardioplegia is given retrograde through the coronary sinus catheter.  Iced saline slush is applied for topical hypothermia.  The initial cardioplegic arrest is rapid with early diastolic arrest.  Repeat doses of cardioplegia are administered intermittently throughout the entire cross clamp portion of the operation through the aortic root and through the coronary sinus catheter in order to maintain completely flat electrocardiogram and septal myocardial temperature below 15C.  Myocardial protection was felt to be  excellent.   Maze Procedure (left atrial lesion set):  The AtriCure Synergy bipolar radiofrequency ablation clamp is used for all radiofrequency ablation lesions for the maze procedure.  The Atricure CryoICE nitrous oxide cryothermy system is utilized for  all cryothermy ablation lesions.   The heart is retracted towards the surgeon's side and the left sided pulmonary veins exposed.  An elliptical ablation lesion is created around the base of the left sided pulmonary veins.  A similar elliptical lesion was created around the base of the left atrial appendage.  The left atrial appendage was obliterated using an Atricure left atrial appendage clip (Atriclip, size 40mm).  The heart was replaced into the pericardial sac.  A left atriotomy incision was performed through the interatrial groove and extended partially across the back wall of the left atrium after opening the oblique sinus inferiorly.  The floor of the left atrium and the mitral valve were exposed using a self-retaining retractor.    An ablation lesion was placed around the right sided pulmonary veins using the bipolar clamp with one limb of the clamp along the endocardial surface and one along the epicardial surface posteriorly.  A bipolar ablation lesion was placed across the dome of the left atrium from the cephalad apex of the atriotomy incision to reach the cephalad apex of the elliptical lesion around the left sided pulmonary veins.  A similar bipolar lesion was placed across the back wall of the left atrium from the caudad apex of the atriotomy incision to reach the caudad apex of the elliptical lesion around the left sided pulmonary veins, thereby completing a box.  Finally another bipolar lesion was placed across the back wall of the left atrium from the caudad apex of the atriotomy incision towards the posterior mitral valve annulus.  This lesion was completed along the endocardial surface onto the posterior mitral annulus with a 3  minute duration cryothermy lesion, followed by a second cryothermy lesion along the posterior epicardial surface of the left atrium to the coronary sinus.  This completes the entire left side lesion set of the Cox maze procedure.  The left atriotomy incision is closed after placing a sump drain across mitral valve to serve as a left ventricular vent.   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 A low transverse oblique aortotomy incision is performed. The aortic valve is inspected. The aortic valve is tricuspid and severely calcified and stenotic. The aortic valve leaflets are excised sharply. The aortic annulus is decalcified. Decalcification is technically straightforward. The aortic root itself is relatively small in caliber. A 21 mm stented bioprosthetic tissue valve sizer will pass through the sinotubular junction and easily fit in the aortic annulus. The aortic annulus actually appears slightly larger than the sinotubular junction. A 23 mm sizer would not pass through the sinotubular junction. Subsequently the aortotomy incision is extended down into the non-coronary sinus of Valsalva all of the way to the aortic annulus. A 23 mm sizer were now easily passed into the aortic annulus.  A patch of bovine pericardium (Peri-Guard ref # [id number], [phone]) is rinsed in saline and trimmed to an elliptical shape for aortic root enlargement.  Aortic valve replacement is performed using interrupted 2-0 Ethibond horizontal mattress pledgeted sutures with pledgets in the subannular position. An [facility] Ease bovine pericardial tissue valve (size 23 mm, model #3300TFX, serial [id number]) is lowered into place.  Prior to securing all of the valve sutures the apex of the aortic root enlargement patch was secured in place using interrupted 4-0 Prolene horizontal mattress pledgeted sutures. After the apex of the patch was secured  all of the valve sutures were secured using Cor-knot clips.  The remainder of the root enlargement patch was utilized to close the aortotomy with a 2 layer closure of running 4-0 Prolene suture.  One final dose of warm retrograde "hot shot" cardioplegia was administered retrograde through the coronary sinus catheter while all air was evacuated through the aortic root.  The aortic cross clamp was removed after a total cross clamp time of 168 minutes.   Maze Procedure (right atrial lesion set):  The retrograde cardioplegia cannula was removed.  The AtriCure Synergy bipolar radiofrequency ablation clamp is utilized to create a series of linear lesions in the right atrium, each with one limb of the clamp along the endocardial surface and the other along the epicardial surface. The first lesion is placed from the posterior apex of the atriotomy incision and along the lateral wall of the right atrium to reach the lateral aspect of the superior vena cava. A second lesion is placed in the opposite direction from the posterior apex of the atriotomy incision along the lateral wall to reach the lateral aspect of the inferior vena cava. A third lesion is placed from the midportion of the atriotomy incision extending at a right angle to reach the tip of the right atrial appendage. A fourth lesion is placed from the anterior apex of the atriotomy incision in an anterior and inferior direction to reach the acute margin of the heart. Finally, the cryotherapy probe is utilized to complete the right atrial lesion set by placing the probe along the endocardial surface of the right atrium from the anterior apex of the atriotomy incision to reach the tricuspid annulus at the 2:00 position. The right atriotomy incision is closed with a 2 layer closure of running 3-0 Prolene suture.   Procedure Completion:  Epicardial pacing wires are fixed to the right ventricular outflow tract and to the right atrial appendage. The patient  is rewarmed to 37C temperature. The aortic and left ventricular vents are removed.  The patient is weaned and disconnected from cardiopulmonary bypass.  The patient's rhythm at separation from bypass was AV paced.  The patient was weaned from cardioplegic bypass without any inotropic support. Total cardiopulmonary bypass time for the operation was 222 minutes.    Followup transesophageal echocardiogram performed after separation from bypass revealed a well-seated aortic valve prosthesis that was functioning normally and without any sign of perivalvular leak.  Left ventricular function was unchanged from preoperatively.  Low-dose milrinone and dopamine infusions were added because of mild right ventricular dysfunction.  The aortic and superior vena cava cannula were removed uneventfully. Protamine was administered to reverse the anticoagulation. The femoral venous cannula was removed and manual pressure held on the groin for 30 minutes.  The mediastinum and pleural space were inspected for hemostasis and irrigated with saline solution.   There was moderate coagulopathy.  The patient received a total of 2 packs adult platelets and 2 units fresh frozen plasma due to coagulopathy and thrombocytopenia after separation from cardiopulmonary bypass and reversal of heparin with protamine.  The mediastinum was drained using 2 chest tubes placed through separate stab incisions inferiorly.  The soft tissues anterior to the aorta were reapproximated loosely. The sternum is closed with double strength sternal wire. The soft tissues anterior to the sternum were closed in multiple layers and the skin is closed with a running subcuticular skin closure.  The post-bypass portion of the operation was notable for stable rhythm and hemodynamics.   Patient Disposition:  The patient tolerated the procedure well and is transported to  the surgical intensive care in stable condition. There are no intraoperative complications.  All sponge instrument and needle counts are verified correct at completion of the operation.     Salvatore Decent. Cornelius Moras MD 01/19/2015 3:57 PM

## 2015-01-20 ENCOUNTER — Encounter (HOSPITAL_COMMUNITY): Payer: Self-pay | Admitting: Thoracic Surgery (Cardiothoracic Vascular Surgery)

## 2015-01-20 ENCOUNTER — Inpatient Hospital Stay (HOSPITAL_COMMUNITY): Payer: Medicare Other

## 2015-01-20 LAB — POCT I-STAT 3, ART BLOOD GAS (G3+)
Acid-base deficit: 3 mmol/L — ABNORMAL HIGH (ref 0.0–2.0)
Acid-base deficit: 2 mmol/L (ref 0.0–2.0)
Acid-base deficit: 4 mmol/L — ABNORMAL HIGH (ref 0.0–2.0)
BICARBONATE: 23.3 meq/L (ref 20.0–24.0)
BICARBONATE: 23.9 meq/L (ref 20.0–24.0)
Bicarbonate: 22.8 mEq/L (ref 20.0–24.0)
O2 SAT: 95 %
O2 SAT: 96 %
O2 Saturation: 91 %
PCO2 ART: 44.8 mmHg (ref 35.0–45.0)
PCO2 ART: 44.8 mmHg (ref 35.0–45.0)
PCO2 ART: 46.5 mmHg — AB (ref 35.0–45.0)
PH ART: 7.326 — AB (ref 7.350–7.450)
PH ART: 7.337 — AB (ref 7.350–7.450)
PO2 ART: 68 mmHg — AB (ref 80.0–100.0)
Patient temperature: 37.3
Patient temperature: 37.2
Patient temperature: 36.9
TCO2: 25 mmol/L (ref 0–100)
TCO2: 25 mmol/L (ref 0–100)
TCO2: 24 mmol/L (ref 0–100)
pH, Arterial: 7.298 — ABNORMAL LOW (ref 7.350–7.450)
pO2, Arterial: 85 mmHg (ref 80.0–100.0)
pO2, Arterial: 89 mmHg (ref 80.0–100.0)

## 2015-01-20 LAB — BASIC METABOLIC PANEL
Anion gap: 8 (ref 5–15)
BUN: 12 mg/dL (ref 6–20)
CO2: 24 mmol/L (ref 22–32)
CREATININE: 0.79 mg/dL (ref 0.44–1.00)
Calcium: 8 mg/dL — ABNORMAL LOW (ref 8.9–10.3)
Chloride: 107 mmol/L (ref 101–111)
GFR calc non Af Amer: >60 mL/min (ref >60)
Glucose, Bld: 129 mg/dL — ABNORMAL HIGH (ref 65–99)
Potassium: 4 mmol/L (ref 3.5–5.1)
Sodium: 139 mmol/L (ref 135–145)

## 2015-01-20 LAB — BLOOD GAS, ARTERIAL

## 2015-01-20 LAB — PREPARE PLATELET PHERESIS
Unit division: 0
Unit division: 0
Unit division: 0

## 2015-01-20 LAB — POCT I-STAT, CHEM 8
BUN: 15 mg/dL (ref 6–20)
Calcium, Ion: 1.25 mmol/L (ref 1.13–1.30)
Chloride: 102 mmol/L (ref 101–111)
Creatinine, Ser: 0.9 mg/dL (ref 0.44–1.00)
Glucose, Bld: 190 mg/dL — ABNORMAL HIGH (ref 65–99)
HEMATOCRIT: 26 % — AB (ref 36.0–46.0)
Hemoglobin: 8.8 g/dL — ABNORMAL LOW (ref 12.0–15.0)
Potassium: 4.1 mmol/L (ref 3.5–5.1)
Sodium: 137 mmol/L (ref 135–145)
TCO2: 21 mmol/L (ref 0–100)

## 2015-01-20 LAB — TYPE AND SCREEN
ABO/RH(D): A NEG
Antibody Screen: NEGATIVE
UNIT DIVISION: 0
Unit division: 0

## 2015-01-20 LAB — APTT: aPTT: 36 seconds (ref 24–37)

## 2015-01-20 LAB — CBC
HCT: 26.4 % — ABNORMAL LOW (ref 36.0–46.0)
HCT: 25.4 % — ABNORMAL LOW (ref 36.0–46.0)
Hemoglobin: 9 g/dL — ABNORMAL LOW (ref 12.0–15.0)
Hemoglobin: 8.8 g/dL — ABNORMAL LOW (ref 12.0–15.0)
MCH: 29.9 pg (ref 26.0–34.0)
MCH: 30.3 pg (ref 26.0–34.0)
MCHC: 34.1 g/dL (ref 30.0–36.0)
MCHC: 34.6 g/dL (ref 30.0–36.0)
MCV: 87.7 fL (ref 78.0–100.0)
MCV: 87.6 fL (ref 78.0–100.0)
PLATELETS: 106 10*3/uL — AB (ref 150–400)
Platelets: 149 10*3/uL — ABNORMAL LOW (ref 150–400)
RBC: 3.01 MIL/uL — AB (ref 3.87–5.11)
RBC: 2.9 MIL/uL — ABNORMAL LOW (ref 3.87–5.11)
RDW: 15.2 % (ref 11.5–15.5)
RDW: 15.6 % — ABNORMAL HIGH (ref 11.5–15.5)
WBC: 9.3 10*3/uL (ref 4.0–10.5)
WBC: 8.2 10*3/uL (ref 4.0–10.5)

## 2015-01-20 LAB — PREPARE FRESH FROZEN PLASMA
UNIT DIVISION: 0
UNIT DIVISION: 0
Unit division: 0
Unit division: 0

## 2015-01-20 LAB — GLUCOSE, CAPILLARY
GLUCOSE-CAPILLARY: 115 mg/dL — AB (ref 65–99)
Glucose-Capillary: 134 mg/dL — ABNORMAL HIGH (ref 65–99)
Glucose-Capillary: 125 mg/dL — ABNORMAL HIGH (ref 65–99)
Glucose-Capillary: 157 mg/dL — ABNORMAL HIGH (ref 65–99)

## 2015-01-20 LAB — MAGNESIUM
Magnesium: 2.6 mg/dL — ABNORMAL HIGH (ref 1.7–2.4)
Magnesium: 2.4 mg/dL (ref 1.7–2.4)
Magnesium: 2.3 mg/dL (ref 1.7–2.4)

## 2015-01-20 LAB — CREATININE, SERUM
CREATININE: 0.94 mg/dL (ref 0.44–1.00)
Creatinine, Ser: 0.78 mg/dL (ref 0.44–1.00)
GFR calc Af Amer: >60 mL/min (ref >60)
GFR calc Af Amer: >60 mL/min (ref >60)
GFR calc non Af Amer: >60 mL/min (ref >60)
GFR calc non Af Amer: 59 mL/min — ABNORMAL LOW (ref >60)

## 2015-01-20 LAB — PROTIME-INR
INR: 1.37 (ref 0.00–1.49)
Prothrombin Time: 17 seconds — ABNORMAL HIGH (ref 11.6–15.2)

## 2015-01-20 MED ORDER — MORPHINE SULFATE 2 MG/ML IJ SOLN: 2.0000 mg | INTRAMUSCULAR | Status: DC | PRN

## 2015-01-20 MED ORDER — FUROSEMIDE 10 MG/ML IJ SOLN
20.0000 mg | Freq: Four times a day (QID) | INTRAMUSCULAR | Status: AC
  Administered 2015-01-20 – 2015-01-21 (×3): 20 mg via INTRAVENOUS
  Filled 2015-01-20 (×2): qty 2

## 2015-01-20 MED ORDER — MILRINONE IN DEXTROSE 20 MG/100ML IV SOLN
0.0000 ug/kg/min | INTRAVENOUS | Status: DC
Start: 2015-01-20 — End: 2015-01-21

## 2015-01-20 MED ORDER — CHLORHEXIDINE GLUCONATE 0.12 % MT SOLN
15.0000 mL | Freq: Two times a day (BID) | OROMUCOSAL | Status: DC
  Administered 2015-01-20: 15 mL via OROMUCOSAL
  Filled 2015-01-20: qty 15

## 2015-01-20 MED ORDER — CETYLPYRIDINIUM CHLORIDE 0.05 % MT LIQD
7.0000 mL | Freq: Four times a day (QID) | OROMUCOSAL | Status: DC
  Administered 2015-01-20: 7 mL via OROMUCOSAL

## 2015-01-20 MED FILL — Potassium Chloride Inj 2 mEq/ML: INTRAVENOUS | Qty: 40 | Status: AC

## 2015-01-20 MED FILL — Heparin Sodium (Porcine) Inj 1000 Unit/ML: INTRAMUSCULAR | Qty: 30 | Status: AC

## 2015-01-20 MED FILL — Sodium Chloride IV Soln 0.9%: INTRAVENOUS | Qty: 3000 | Status: AC

## 2015-01-20 MED FILL — Electrolyte-R (PH 7.4) Solution: INTRAVENOUS | Qty: 4000 | Status: AC

## 2015-01-20 MED FILL — Lidocaine HCl IV Inj 20 MG/ML: INTRAVENOUS | Qty: 5 | Status: AC

## 2015-01-20 MED FILL — Magnesium Sulfate Inj 50%: INTRAMUSCULAR | Qty: 10 | Status: AC

## 2015-01-20 MED FILL — Mannitol IV Soln 20%: INTRAVENOUS | Qty: 500 | Status: AC

## 2015-01-20 MED FILL — Heparin Sodium (Porcine) Inj 1000 Unit/ML: INTRAMUSCULAR | Qty: 10 | Status: AC

## 2015-01-20 MED FILL — Sodium Bicarbonate IV Soln 8.4%: INTRAVENOUS | Qty: 50 | Status: AC

## 2015-01-20 NOTE — Procedures (Signed)
Extubation Procedure Note  Patient Details:   Name: Sandra Turner DOB: Mar 30, 1941 MRN: [id number]   Airway Documentation:     Evaluation  O2 sats: stable throughout Complications: No apparent complications Patient did tolerate procedure well. Bilateral Breath Sounds: Clear, Diminished   Yes    Ok AnisKelly Smith, MA 01/20/2015, 8:21 AM

## 2015-01-20 NOTE — Care Management Note (Signed)
Case Management Note  Patient Details  Name: Sandra Turner MRN: [id number] Date of Birth: 09/18/1940  Subjective/Objective:                  Admitted post op AVR.  Lives at home alone.  Plan for discharge is for her first cousin, Sandra Turner, to be with her 24/7.  CM will continue to follow for further needs.   Action/Plan:   Expected Discharge Date:                  Expected Discharge Plan:  Home w Home Health Services  In-House Referral:     Discharge planning Services     Post Acute Care Choice:    Choice offered to:     DME Arranged:    DME Agency:     HH Arranged:    HH Agency:     Status of Service:  In process, will continue to follow  Medicare Important Message Given:    Date Medicare IM Given:    Medicare IM give by:    Date Additional Medicare IM Given:    Additional Medicare Important Message give by:     If discussed at Long Length of Stay Meetings, dates discussed:    Additional Comments:  Vangie BickerBrown, Dezirae Service Jane, RN 01/20/2015, 11:34 AM

## 2015-01-20 NOTE — Progress Notes (Signed)
[address]       [place], [id number]             [phone]        CARDIOTHORACIC SURGERY PROGRESS NOTE   R1 Day Post-Op Procedure(s) (LRB): AORTIC VALVE REPLACEMENT (AVR) (N/A) MAZE (N/A)  AORTIC ROOT ENLARGEMENT (N/A) TRANSESOPHAGEAL ECHOCARDIOGRAM (TEE) (N/A)  Subjective: Awake and alert on vent.  Gagging on ET tube.  Follows commands.  Objective: Vital signs: BP Readings from Last 1 Encounters:  01/20/15 111/42   Pulse Readings from Last 1 Encounters:  01/20/15 92   Resp Readings from Last 1 Encounters:  01/20/15 22   Temp Readings from Last 1 Encounters:  01/20/15 98.4 F (36.9 C)     Hemodynamics: PAP: (33-50)/(19-29) 38/23 mmHg CO:  [3.1 L/min-5.4 L/min] 5.4 L/min CI:  [1.5 L/min/m2-2.6 L/min/m2] 2.6 L/min/m2  Physical Exam:  Rhythm:   AAI paced  Breath sounds: clear  Heart sounds:  RRR  Incisions:  Dressing dry, intact  Abdomen:  Soft, non-distended, non-tender  Extremities:  Warm, well-perfused  Chest tubes:  Low volume thin serosanguinous output, no air leak    Intake/Output from previous day: 05/18 0701 - 05/19 0700 In: 10900.3 [I.V.:5324.3; UUVOZ:3664Blood:3676; IV Piggyback:1900] Out: 4710 [Urine:1700; Blood:2100; Chest Tube:910] Intake/Output this shift: Total I/O In: 100 [IV Piggyback:100] Out: -   Lab Results:  CBC: Recent Labs  01/19/15 2230 01/19/15 2341 01/20/15 0400  WBC 10.3  --  9.3  HGB 9.8* 9.5* 9.0*  HCT 28.7* 28.0* 26.4*  PLT 156  --  149*    BMET:  Recent Labs  01/17/15 1254  01/19/15 2341 01/20/15 0400  NA 143  < > 140 139  K 3.9  < > 4.0 4.0  CL 106  < > 104 107  CO2 27  --   --  24  GLUCOSE 98  < > 156* 129*  BUN 18  < > 12 12  CREATININE 0.98  < > 0.70 0.79  CALCIUM 9.2  --   --  8.0*  < > = values in this interval not displayed.   CBG (last 3)   Recent Labs  01/19/15 1953 01/19/15 2339 01/20/15 0354  GLUCAP 125* 146* 115*    ABG    Component Value Date/Time   PHART  7.337* 01/20/2015 0547   PCO2ART 44.8 01/20/2015 0547   PO2ART 89.0 01/20/2015 0547   HCO3 23.9 01/20/2015 0547   TCO2 25 01/20/2015 0547   ACIDBASEDEF 2.0 01/20/2015 0547   O2SAT 96.0 01/20/2015 0547    CXR: PORTABLE CHEST - 1 VIEW  COMPARISON: 01/19/2015 .  FINDINGS: Endotracheal tube, NG tube, Swan-Ganz catheter right IJ sheath, mediastinal drainage catheter in stable position. Prior CABG and cardiac valve replacement. Left atrial appendage clip noted. Cardiomegaly with increasing pulmonary venous congestion interstitial prominence. Bilateral effusions. These findings are consistent with congestive heart failure. Degenerative changes both shoulders.  IMPRESSION: 1. Lines and tubes in stable position. 2. Prior CABG and cardiac valve replacement . Progressive changes of congestive heart failure with bilateral pulmonary interstitial edema and bilateral effusions.   Electronically Signed  By: Maisie Fushomas Register  On: 01/20/2015 07:44  Assessment/Plan: S/P Procedure(s) (LRB): AORTIC VALVE REPLACEMENT (AVR) (N/A) MAZE (N/A)  AORTIC ROOT ENLARGEMENT (N/A) TRANSESOPHAGEAL ECHOCARDIOGRAM (TEE) (N/A)  Overall stable POD1 Maintaining AAI paced rhythm w/ stable hemodynamics but still on Neo for BP support w/ low dose milrinone and dopamine Wide awake on vent, looks ready for extubation Expected post  op acute blood loss anemia, stable Expected post op volume excess, mild COPD Obesity Arthritis   Proceed w/ extubation  Mobilize post extubation  Wean drips  Hold diuretics until BP improve   Purcell NailsClarence H Jode Lippe 01/20/2015 7:50 AM

## 2015-01-20 NOTE — Progress Notes (Signed)
Attempted to wean patient twice.  Pt able to follow all commands, with stable ABG's, but unable to successfully perform mechanics, and does not have a cuff leak.  Patient has a history of being a difficult intubation per RT.  Will defer to am rounds.

## 2015-01-20 NOTE — Progress Notes (Signed)
[facility] BRIEF SICU PROGRESS NOTE  1 Day Post-Op  S/P Procedure(s) (LRB): AORTIC VALVE REPLACEMENT (AVR) (N/A) MAZE (N/A)  AORTIC ROOT ENLARGEMENT (N/A) TRANSESOPHAGEAL ECHOCARDIOGRAM (TEE) (N/A)   Looks good Mild soreness in chest  AAI paced w/ stable BP off all drips O2 sats 98-100% on 2 L/min UOP adequate Labs okay  Plan: Continue current plan  Sandra Turner 01/20/2015 7:28 PM

## 2015-01-21 ENCOUNTER — Inpatient Hospital Stay (HOSPITAL_COMMUNITY): Payer: Medicare Other

## 2015-01-21 LAB — BASIC METABOLIC PANEL
ANION GAP: 6 (ref 5–15)
BUN: 16 mg/dL (ref 6–20)
CALCIUM: 8.5 mg/dL — AB (ref 8.9–10.3)
CHLORIDE: 104 mmol/L (ref 101–111)
CO2: 26 mmol/L (ref 22–32)
Creatinine, Ser: 1.07 mg/dL — ABNORMAL HIGH (ref 0.44–1.00)
GFR calc Af Amer: 59 mL/min — ABNORMAL LOW (ref >60)
GFR calc non Af Amer: 51 mL/min — ABNORMAL LOW (ref >60)
Glucose, Bld: 119 mg/dL — ABNORMAL HIGH (ref 65–99)
Potassium: 4.1 mmol/L (ref 3.5–5.1)
SODIUM: 136 mmol/L (ref 135–145)

## 2015-01-21 LAB — GLUCOSE, CAPILLARY
GLUCOSE-CAPILLARY: 112 mg/dL — AB (ref 65–99)
GLUCOSE-CAPILLARY: 115 mg/dL — AB (ref 65–99)
GLUCOSE-CAPILLARY: 139 mg/dL — AB (ref 65–99)
Glucose-Capillary: 158 mg/dL — ABNORMAL HIGH (ref 65–99)
Glucose-Capillary: 128 mg/dL — ABNORMAL HIGH (ref 65–99)
Glucose-Capillary: 121 mg/dL — ABNORMAL HIGH (ref 65–99)
Glucose-Capillary: 134 mg/dL — ABNORMAL HIGH (ref 65–99)

## 2015-01-21 LAB — CBC
HCT: 25.8 % — ABNORMAL LOW (ref 36.0–46.0)
Hemoglobin: 8.6 g/dL — ABNORMAL LOW (ref 12.0–15.0)
MCH: 29.9 pg (ref 26.0–34.0)
MCHC: 33.3 g/dL (ref 30.0–36.0)
MCV: 89.6 fL (ref 78.0–100.0)
PLATELETS: 109 10*3/uL — AB (ref 150–400)
RBC: 2.88 MIL/uL — ABNORMAL LOW (ref 3.87–5.11)
RDW: 16.1 % — ABNORMAL HIGH (ref 11.5–15.5)
WBC: 9.5 10*3/uL (ref 4.0–10.5)

## 2015-01-21 MED ORDER — FUROSEMIDE 80 MG PO TABS
80.0000 mg | ORAL_TABLET | Freq: Two times a day (BID) | ORAL | Status: DC
  Filled 2015-01-21: qty 1

## 2015-01-21 MED ORDER — AMIODARONE HCL 200 MG PO TABS
400.0000 mg | ORAL_TABLET | Freq: Every day | ORAL | Status: DC
  Administered 2015-01-29 – 2015-01-31 (×3): 400 mg via ORAL
  Filled 2015-01-21 (×3): qty 2

## 2015-01-21 MED ORDER — SODIUM CHLORIDE 0.9 % IJ SOLN
3.0000 mL | Freq: Two times a day (BID) | INTRAMUSCULAR | Status: DC
  Administered 2015-01-22: 10 mL via INTRAVENOUS
  Administered 2015-01-22 – 2015-01-23 (×2): 3 mL via INTRAVENOUS

## 2015-01-21 MED ORDER — SODIUM CHLORIDE 0.9 % IJ SOLN
3.0000 mL | INTRAMUSCULAR | Status: DC | PRN
Start: 2015-01-21 — End: 2015-01-23

## 2015-01-21 MED ORDER — ENOXAPARIN SODIUM 30 MG/0.3ML ~~LOC~~ SOLN
30.0000 mg | SUBCUTANEOUS | Status: DC
  Administered 2015-01-22 – 2015-01-25 (×4): 30 mg via SUBCUTANEOUS
  Filled 2015-01-21 (×6): qty 0.3

## 2015-01-21 MED ORDER — DEXTROSE 5 % IV SOLN
10.0000 mg/h | INTRAVENOUS | Status: DC
  Administered 2015-01-21 – 2015-01-23 (×2): 10 mg/h via INTRAVENOUS
  Administered 2015-01-24 – 2015-01-26 (×4): 15 mg/h via INTRAVENOUS
  Filled 2015-01-21 (×15): qty 25

## 2015-01-21 MED ORDER — AMIODARONE HCL IN DEXTROSE 360-4.14 MG/200ML-% IV SOLN
60.0000 mg/h | INTRAVENOUS | Status: AC
  Administered 2015-01-21 (×2): 60 mg/h via INTRAVENOUS
  Filled 2015-01-21: qty 400

## 2015-01-21 MED ORDER — AMIODARONE HCL 200 MG PO TABS
400.0000 mg | ORAL_TABLET | Freq: Two times a day (BID) | ORAL | Status: AC
  Administered 2015-01-22 – 2015-01-28 (×13): 400 mg via ORAL
  Filled 2015-01-21 (×14): qty 2

## 2015-01-21 MED ORDER — WARFARIN - PHYSICIAN DOSING INPATIENT
Freq: Every day | Status: DC
  Administered 2015-01-22 – 2015-02-20 (×11)

## 2015-01-21 MED ORDER — INSULIN ASPART 100 UNIT/ML ~~LOC~~ SOLN
0.0000 [IU] | Freq: Three times a day (TID) | SUBCUTANEOUS | Status: DC
  Administered 2015-01-21 – 2015-01-25 (×13): 2 [IU] via SUBCUTANEOUS
  Administered 2015-01-26: 4 [IU] via SUBCUTANEOUS
  Administered 2015-01-26 – 2015-01-28 (×6): 2 [IU] via SUBCUTANEOUS
  Administered 2015-01-28 (×2): 4 [IU] via SUBCUTANEOUS
  Administered 2015-01-29 – 2015-01-30 (×5): 2 [IU] via SUBCUTANEOUS

## 2015-01-21 MED ORDER — POTASSIUM CHLORIDE CRYS ER 20 MEQ PO TBCR
20.0000 meq | EXTENDED_RELEASE_TABLET | Freq: Two times a day (BID) | ORAL | Status: DC
  Administered 2015-01-22 (×2): 20 meq via ORAL
  Filled 2015-01-21 (×5): qty 1

## 2015-01-21 MED ORDER — AMIODARONE HCL IN DEXTROSE 360-4.14 MG/200ML-% IV SOLN
30.0000 mg/h | INTRAVENOUS | Status: AC
  Filled 2015-01-21 (×5): qty 200

## 2015-01-21 MED ORDER — IPRATROPIUM-ALBUTEROL 0.5-2.5 (3) MG/3ML IN SOLN
3.0000 mL | RESPIRATORY_TRACT | Status: DC
  Administered 2015-01-21 – 2015-01-23 (×10): 3 mL via RESPIRATORY_TRACT
  Filled 2015-01-21 (×10): qty 3

## 2015-01-21 MED ORDER — SODIUM CHLORIDE 0.45 % IV SOLN
INTRAVENOUS | Status: DC
  Administered 2015-01-21: 12:00:00 via INTRAVENOUS
  Administered 2015-01-22 – 2015-01-23 (×2): 20 mL/h via INTRAVENOUS

## 2015-01-21 MED ORDER — MOVING RIGHT ALONG BOOK
Freq: Once | Status: AC
  Administered 2015-01-21: 09:00:00
  Filled 2015-01-21: qty 1

## 2015-01-21 MED ORDER — FUROSEMIDE 10 MG/ML IJ SOLN
40.0000 mg | Freq: Four times a day (QID) | INTRAMUSCULAR | Status: DC
  Administered 2015-01-21 (×2): 40 mg via INTRAVENOUS
  Filled 2015-01-21 (×2): qty 4

## 2015-01-21 MED ORDER — WARFARIN SODIUM 2.5 MG PO TABS
2.5000 mg | ORAL_TABLET | Freq: Every day | ORAL | Status: DC
  Administered 2015-01-21 – 2015-01-26 (×6): 2.5 mg via ORAL
  Filled 2015-01-21 (×7): qty 1

## 2015-01-21 MED ORDER — AMIODARONE LOAD VIA INFUSION
150.0000 mg | Freq: Once | INTRAVENOUS | Status: AC
  Administered 2015-01-21: 150 mg via INTRAVENOUS
  Filled 2015-01-21: qty 83.34

## 2015-01-21 MED ORDER — ASPIRIN EC 81 MG PO TBEC
81.0000 mg | DELAYED_RELEASE_TABLET | Freq: Every day | ORAL | Status: DC
  Administered 2015-01-21 – 2015-01-31 (×11): 81 mg via ORAL
  Filled 2015-01-21 (×12): qty 1

## 2015-01-21 MED ORDER — SODIUM CHLORIDE 0.9 % IV SOLN: 250.0000 mL | INTRAVENOUS | Status: DC | PRN

## 2015-01-21 NOTE — Progress Notes (Signed)
      [address]       [place] [id number]             [phone]       Some wheezing noted  BP 109/87 mmHg  Pulse 88  Temp(Src) 98.2 F (36.8 C) (Oral)  Resp 28  Ht 5\' 2"  (1.575 m)  Wt 256 lb 6.3 oz (116.3 kg)  BMI 46.88 kg/m2  SpO2 100%   Intake/Output Summary (Last 24 hours) at 01/21/15 1727 Last data filed at 01/21/15 1600  Gross per 24 hour  Intake 1579.54 ml  Output    850 ml  Net 729.54 ml   Started on lasix drip this afternoon  Viviann SpareSteven C. Dorris FetchHendrickson, MD [facility] [phone]

## 2015-01-21 NOTE — Progress Notes (Signed)
[address]       Sandra Turner,[place] [id number]             [phone]        CARDIOTHORACIC SURGERY PROGRESS NOTE   R2 Days Post-Op Procedure(s) (LRB): AORTIC VALVE REPLACEMENT (AVR) (N/A) MAZE (N/A)  AORTIC ROOT ENLARGEMENT (N/A) TRANSESOPHAGEAL ECHOCARDIOGRAM (TEE) (N/A)  Subjective: Feels a little better.  Mild soreness.  Denies SOB  Objective: Vital signs: BP Readings from Last 1 Encounters:  01/21/15 133/49   Pulse Readings from Last 1 Encounters:  01/21/15 94   Resp Readings from Last 1 Encounters:  01/21/15 20   Temp Readings from Last 1 Encounters:  01/21/15 98.4 F (36.9 C)     Hemodynamics: PAP: (45-70)/(20-37) 53/36 mmHg CO:  [8 L/min-8.2 L/min] 8 L/min CI:  [3.8 L/min/m2-3.9 L/min/m2] 3.8 L/min/m2  Physical Exam:  Rhythm:   Afib w/ HR 70's  Breath sounds: clear  Heart sounds:  irregular  Incisions:  Dressing dry, intact  Abdomen:  Soft, non-distended, non-tender  Extremities:  Warm, well-perfused  Chest tubes:  Low volume thin serosanguinous output, no air leak    Intake/Output from previous day: 05/19 0701 - 05/20 0700 In: 1473.3 [P.O.:650; I.V.:723.3; IV Piggyback:100] Out: 1155 [Urine:765; Chest Tube:390] Intake/Output this shift:    Lab Results:  CBC: Recent Labs  01/20/15 1415 01/20/15 1416 01/21/15 0433  WBC 8.2  --  9.5  HGB 8.8* 8.8* 8.6*  HCT 25.4* 26.0* 25.8*  PLT 106*  --  109*    BMET:  Recent Labs  01/20/15 0400  01/20/15 1416 01/21/15 0433  NA 139  --  137 136  K 4.0  --  4.1 4.1  CL 107  --  102 104  CO2 24  --   --  26  GLUCOSE 129*  --  190* 119*  BUN 12  --  15 16  CREATININE 0.79  < > 0.90 1.07*  CALCIUM 8.0*  --   --  8.5*  < > = values in this interval not displayed.   CBG (last 3)   Recent Labs  01/20/15 2003 01/20/15 2352 01/21/15 0409  GLUCAP 157* 139* 112*    ABG    Component Value Date/Time   PHART TEST WILL BE CREDITED 01/20/2015 0930   PCO2ART TEST WILL BE  CREDITED 01/20/2015 0930   PO2ART TEST WILL BE CREDITED 01/20/2015 0930   HCO3 TEST WILL BE CREDITED 01/20/2015 0930   TCO2 21 01/20/2015 1416   ACIDBASEDEF TEST WILL BE CREDITED 01/20/2015 0930   O2SAT TEST WILL BE CREDITED 01/20/2015 0930    CXR: PORTABLE CHEST - 1 VIEW  COMPARISON: 01/20/2015  FINDINGS: Postoperative changes are again identified. The Swan-Ganz catheter has been removed although the jugular sheath remains in place. A right jugular central line is again noted in stable position. Mediastinal drain is within normal limits. Nasogastric catheter and endotracheal tube have been removed. Stable bibasilar changes are seen. The degree of vascular congestion is unchanged as well.  IMPRESSION: Interval removal of endotracheal tube and nasogastric catheter as well as the Swan-Ganz catheter.  Stable vascular congestion and bibasilar changes.   Electronically Signed  By: Alcide CleverMark Lukens M.D.  On: 01/21/2015 07:29   Assessment/Plan: S/P Procedure(s) (LRB): AORTIC VALVE REPLACEMENT (AVR) (N/A) MAZE (N/A)  AORTIC ROOT ENLARGEMENT (N/A) TRANSESOPHAGEAL ECHOCARDIOGRAM (TEE) (N/A)  Overall doing well POD2 Now in Afib w/ controlled rate.  BP stable Expected post op acute blood loss anemia, stable Expected  post op volume excess, not diuresing much yet Post op thrombocytopenia, mild, stable Obesity Physical deconditioning COPD Arthritis   D/C chest tubes  Mobilize  Diuresis  Start amiodarone  Low dose lovenox for DVT prophylaxis  Start coumadin slowly  PT consult  Possible transfer to step down later today or tomorrow  Sandra Turner 01/21/2015 8:10 AM

## 2015-01-21 NOTE — Evaluation (Signed)
Physical Therapy Evaluation Patient Details Name: Sandra Turner Lookingbill MRN: [id number] DOB: Mar 22, 1941 Today's Date: 01/21/2015   History of Present Illness  Sandra Turner Gimbel is a 74 year old female with severe aortic stenosis and atrial fibrillation who was hospitalized in January, 1016 with shortness of breath and a presumed COPD exacerbation. Since then she has had symptoms of exertional shortness of breath. Cardiac catheterization on 10/22/14 showed no evidence of CAD. She is now scheduled to undergo aortic valve replacement and Maze procedure by Dr. Cornelius Moraswen. Intra-operative transesophageal echocardiography was requested to evaluate the aortic valve, to assess for any other valvular pathology, to serve as a monitor for left and right ventricular function, and to serve as a monitor for intraoperative volume status. The patient [facility] and general anesthesia was induced without difficulty.  AVR 01/20/15.  Clinical Impression  Pt admitted with above diagnosis. Pt currently with functional limitations due to the deficits listed below (see PT Problem List). Pt ambulated well pushing wheelchair overall.  Appears deconditioned and will most likely benefit from NH for therapy.   Pt will benefit from skilled PT to increase their independence and safety with mobility to allow discharge to the venue listed below.      Follow Up Recommendations SNF;Supervision/Assistance - 24 hour    Equipment Recommendations  Rolling walker with 5" wheels;3in1 (PT)    Recommendations for Other Services       Precautions / Restrictions Precautions Precautions: Fall;Sternal Restrictions Weight Bearing Restrictions: No      Mobility  Bed Mobility               General bed mobility comments: on 3N1 on arrival.   Transfers Overall transfer level: Needs assistance Equipment used:  (pushing wheelchair) Transfers: Sit to/from Stand Sit to Stand: Mod assist;+2 physical  assistance         General transfer comment: Needed mod assist for sternal precautions.  Pt with knee instability bil.   Ambulation/Gait Ambulation/Gait assistance: Min assist;Mod assist;+2 physical assistance Ambulation Distance (Feet): 100 Feet Assistive device:  (pushing wheelchair) Gait Pattern/deviations: Decreased stride length;Decreased step length - right;Decreased step length - left;Step-through pattern;Shuffle;Drifts right/left;Trunk flexed;Wide base of support;Antalgic   Gait velocity interpretation: Below normal speed for age/gender General Gait Details: Pt needed assist tosteer wheelchair as it was moving away from her at times.  Pt needed cues to stay close to wheelchair.  Needed several stannding rest breaks and needed max encouragement.  Needed assist to turn as well.  Needed assist to back up to chair and cues as well as assist and cues to control descent into chair.   Stairs            Wheelchair Mobility    Modified Rankin (Stroke Patients Only)       Balance Overall balance assessment: Needs assistance;History of Falls         Standing balance support: Bilateral upper extremity supported;During functional activity Standing balance-Leahy Scale: Poor Standing balance comment: Pt stood with UE support with reliance on wheelchair.                              Pertinent Vitals/Pain Pain Assessment: Faces Faces Pain Scale: Hurts little more Pain Location: sternum Pain Descriptors / Indicators: Grimacing;Guarding Pain Intervention(s): Limited activity within patient's tolerance;Monitored during session;Premedicated before session;Repositioned  VSS with sats 93% and > on 2LO2 with pt DOE 3/4 needing standing rest breaks. Pt in  afib with HR 104-116 bpm.      Home Living Family/patient expects to be discharged to:: Private residence Living Arrangements: Alone Available Help at Discharge: Family;Available PRN/intermittently Type of Home:  House Home Access: Stairs to enter Entrance Stairs-Rails: Right;Left;Can reach both Entrance Stairs-Number of Steps: 3 Home Layout: One level Home Equipment: None      Prior Function Level of Independence: Independent         Comments: worked as a Interior and spatial designerbeautician PTA.      Hand Dominance        Extremity/Trunk Assessment   Upper Extremity Assessment: Defer to OT evaluation           Lower Extremity Assessment: Generalized weakness      Cervical / Trunk Assessment: Kyphotic  Communication   Communication: No difficulties  Cognition Arousal/Alertness: Awake/alert Behavior During Therapy: WFL for tasks assessed/performed Overall Cognitive Status: Within Functional Limits for tasks assessed                      General Comments      Exercises        Assessment/Plan    PT Assessment Patient needs continued PT services  PT Diagnosis Generalized weakness;Acute pain   PT Problem List Decreased activity tolerance;Decreased balance;Decreased mobility;Decreased knowledge of use of DME;Decreased safety awareness;Decreased knowledge of precautions;Pain  PT Treatment Interventions DME instruction;Gait training;Functional mobility training;Therapeutic activities;Therapeutic exercise;Balance training;Patient/family education;Stair training   PT Goals (Current goals can be found in the Care Plan section) Acute Rehab PT Goals Patient Stated Goal: to go home PT Goal Formulation: With patient Time For Goal Achievement: 02/04/15 Potential to Achieve Goals: Good    Frequency Min 3X/week   Barriers to discharge Decreased caregiver support      Co-evaluation               End of Session Equipment Utilized During Treatment: Gait belt;Oxygen Activity Tolerance: Patient limited by fatigue;Patient limited by pain Patient left: in chair;with call bell/phone within reach Nurse Communication: Mobility status         Time: [id number] PT Time Calculation (min)  (ACUTE ONLY): 15 min   Charges:   PT Evaluation $Initial PT Evaluation Tier I: 1 Procedure     PT G CodesBerline Lopes:        Britanny Marksberry F 01/21/2015, 5:02 PM [facility] Makyna Niehoff,PT Acute Rehabilitation [phone] (pager)

## 2015-01-21 NOTE — Progress Notes (Signed)
Anesthesiology Follow-up:  Awake and alert, neuro intact, hemodynamically stable, having some incisional pain.  Vs: T- 36.9 BP- 135/78 HR-80 (AAI-paced) RR- 19 O2 sat 99% on 2L  K-4.1 BUN/Cr. 16/1.07 glucose - 105 H/H- 8.6/25.8 Platelets- 109,000  Extubated at 08:20 yesterday, on POD #1   POD #2 S/P AVR and aortic root enlargement. Overall doing well without apparent complications.   Sandra Turner

## 2015-01-22 LAB — PROTIME-INR
INR: 1.29 (ref 0.00–1.49)
Prothrombin Time: 16.2 seconds — ABNORMAL HIGH (ref 11.6–15.2)

## 2015-01-22 LAB — BASIC METABOLIC PANEL
ANION GAP: 8 (ref 5–15)
Anion gap: 8 (ref 5–15)
BUN: 19 mg/dL (ref 6–20)
BUN: 22 mg/dL — ABNORMAL HIGH (ref 6–20)
CALCIUM: 8.8 mg/dL — AB (ref 8.9–10.3)
CHLORIDE: 99 mmol/L — AB (ref 101–111)
CHLORIDE: 99 mmol/L — AB (ref 101–111)
CO2: 28 mmol/L (ref 22–32)
CO2: 29 mmol/L (ref 22–32)
CREATININE: 1.07 mg/dL — AB (ref 0.44–1.00)
CREATININE: 1.04 mg/dL — AB (ref 0.44–1.00)
Calcium: 8.9 mg/dL (ref 8.9–10.3)
GFR calc non Af Amer: 52 mL/min — ABNORMAL LOW (ref >60)
GFR, EST AFRICAN AMERICAN: 59 mL/min — AB (ref >60)
GFR, EST NON AFRICAN AMERICAN: 51 mL/min — AB (ref >60)
Glucose, Bld: 125 mg/dL — ABNORMAL HIGH (ref 65–99)
Glucose, Bld: 128 mg/dL — ABNORMAL HIGH (ref 65–99)
POTASSIUM: 3.8 mmol/L (ref 3.5–5.1)
Potassium: 4.1 mmol/L (ref 3.5–5.1)
Sodium: 135 mmol/L (ref 135–145)
Sodium: 136 mmol/L (ref 135–145)

## 2015-01-22 LAB — CBC
HCT: 26 % — ABNORMAL LOW (ref 36.0–46.0)
Hemoglobin: 8.5 g/dL — ABNORMAL LOW (ref 12.0–15.0)
MCH: 29.4 pg (ref 26.0–34.0)
MCHC: 32.7 g/dL (ref 30.0–36.0)
MCV: 90 fL (ref 78.0–100.0)
PLATELETS: 134 10*3/uL — AB (ref 150–400)
RBC: 2.89 MIL/uL — AB (ref 3.87–5.11)
RDW: 15.8 % — AB (ref 11.5–15.5)
WBC: 11.8 10*3/uL — AB (ref 4.0–10.5)

## 2015-01-22 LAB — GLUCOSE, CAPILLARY
Glucose-Capillary: 90 mg/dL (ref 65–99)
Glucose-Capillary: 129 mg/dL — ABNORMAL HIGH (ref 65–99)
Glucose-Capillary: 116 mg/dL — ABNORMAL HIGH (ref 65–99)
Glucose-Capillary: 133 mg/dL — ABNORMAL HIGH (ref 65–99)

## 2015-01-22 MED ORDER — ALBUTEROL SULFATE (2.5 MG/3ML) 0.083% IN NEBU: 2.5000 mg | INHALATION_SOLUTION | RESPIRATORY_TRACT | Status: DC | PRN

## 2015-01-22 MED ORDER — POTASSIUM CHLORIDE 10 MEQ/50ML IV SOLN
10.0000 meq | INTRAVENOUS | Status: AC
  Administered 2015-01-22 (×2): 10 meq via INTRAVENOUS
  Filled 2015-01-22 (×2): qty 50

## 2015-01-22 NOTE — Progress Notes (Signed)
      [address]       [place] [id number]             [phone]      Up in chair  BP 131/66 mmHg  Pulse 88  Temp(Src) 98.5 F (36.9 C) (Oral)  Resp 26  Ht 5\' 2"  (1.575 m)  Wt 257 lb 4.4 oz (116.7 kg)  BMI 47.04 kg/m2  SpO2 98% Still in rate controlled atrial fib  Intake/Output Summary (Last 24 hours) at 01/22/15 1939 Last data filed at 01/22/15 1900  Gross per 24 hour  Intake 5269.63 ml  Output   2195 ml  Net 3074.63 ml    Creatinine stable at 1.04, K= 4.1  Continue lasix drip  Beginning to NIKEdiurese  Fabiana Dromgoole C. Dorris FetchHendrickson, MD [facility] [phone]

## 2015-01-22 NOTE — Progress Notes (Signed)
3 Days Post-Op Procedure(s) (LRB): AORTIC VALVE REPLACEMENT (AVR) (N/A) MAZE (N/A)  AORTIC ROOT ENLARGEMENT (N/A) TRANSESOPHAGEAL ECHOCARDIOGRAM (TEE) (N/A) Subjective: Up in chair eating breakfast Feels fair  Objective: Vital signs in last 24 hours: Temp:  [97.7 F (36.5 C)-99 F (37.2 C)] 98.7 F (37.1 C) (05/21 0813) Pulse Rate:  [79-96] 95 (05/21 0800) Cardiac Rhythm:  [-] Atrial paced (05/21 0800) Resp:  [17-30] 21 (05/21 0800) BP: (103-155)/(45-99) 134/99 mmHg (05/21 0800) SpO2:  [94 %-100 %] 100 % (05/21 0800) Weight:  [257 lb 4.4 oz (116.7 kg)] 257 lb 4.4 oz (116.7 kg) (05/21 0200)  Hemodynamic parameters for last 24 hours:    Intake/Output from previous day: 05/20 0701 - 05/21 0700 In: 5855.8 [P.O.:840; I.V.:5015.8] Out: 1596 [Urine:1596] Intake/Output this shift: Total I/O In: 46.7 [I.V.:46.7] Out: 250 [Urine:250]  General appearance: alert and no distress Neurologic: intact Heart: irregularly irregular rhythm Lungs: diminished breath sounds bibasilar Abdomen: normal findings: soft, non-tender Extremities: edema 2-3+  Lab Results:  Recent Labs  01/21/15 0433 01/22/15 0430  WBC 9.5 11.8*  HGB 8.6* 8.5*  HCT 25.8* 26.0*  PLT 109* 134*   BMET:  Recent Labs  01/21/15 0433 01/22/15 0430  NA 136 135  K 4.1 3.8  CL 104 99*  CO2 26 28  GLUCOSE 119* 125*  BUN 16 19  CREATININE 1.07* 1.07*  CALCIUM 8.5* 8.8*    PT/INR:  Recent Labs  01/22/15 0430  LABPROT 16.2*  INR 1.29   ABG    Component Value Date/Time   PHART TEST WILL BE CREDITED 01/20/2015 0930   HCO3 TEST WILL BE CREDITED 01/20/2015 0930   TCO2 21 01/20/2015 1416   ACIDBASEDEF TEST WILL BE CREDITED 01/20/2015 0930   O2SAT TEST WILL BE CREDITED 01/20/2015 0930   CBG (last 3)   Recent Labs  01/21/15 1647 01/21/15 2210 01/22/15 0809  GLUCAP 121* 134* 90    Assessment/Plan: S/P Procedure(s) (LRB): AORTIC VALVE REPLACEMENT (AVR) (N/A) MAZE (N/A)  AORTIC ROOT ENLARGEMENT  (N/A) TRANSESOPHAGEAL ECHOCARDIOGRAM (TEE) (N/A) -  CV- in a fib, rate controlled with amiodarone  Convert amiodarone to PO  On coumadin 2.5 mg PO daily  RESP- bibasilar atelectasis and effusions  Continue IS  RENAL- still volume overloaded, continue lasix drip  Deconditioning- seen by PT yesterday  SCD + enoxaparin for DVT prophylaxis   LOS: 3 days    Loreli SlotSteven C Sanaia Jasso 01/22/2015

## 2015-01-22 NOTE — Plan of Care (Signed)
Problem: Phase III - Recovery through Discharge Goal: Activity Progressed Outcome: Not Progressing Patient dyspneic at rest and has expiratory wheezes with exertion requiring breathing treatments.

## 2015-01-22 NOTE — Progress Notes (Signed)
Scheduled neb tx given for SOB, wheeze. Pt still c/o SOB, expiratory wheeze heard, mostly upper airway wheeze. Pt requesting to sit on side of bed. Pt states she does feel a little better sitting up. HR 96, RR 27, O2 96% on 1L [place]. Albuterol Q2 PRN ordered per protocol. RT will continue to monitor.

## 2015-01-22 NOTE — Plan of Care (Signed)
Problem: Phase III - Recovery through Discharge Goal: Activity Progressed Outcome: Not Progressing Significant dyspnea at rest, tachypnea and expiratory wheezing with exertion

## 2015-01-23 ENCOUNTER — Inpatient Hospital Stay (HOSPITAL_COMMUNITY): Payer: Medicare Other

## 2015-01-23 LAB — CBC
HCT: 25.2 % — ABNORMAL LOW (ref 36.0–46.0)
HEMOGLOBIN: 8.2 g/dL — AB (ref 12.0–15.0)
MCH: 29.3 pg (ref 26.0–34.0)
MCHC: 32.5 g/dL (ref 30.0–36.0)
MCV: 90 fL (ref 78.0–100.0)
Platelets: 157 10*3/uL (ref 150–400)
RBC: 2.8 MIL/uL — ABNORMAL LOW (ref 3.87–5.11)
RDW: 15.7 % — AB (ref 11.5–15.5)
WBC: 11.6 10*3/uL — AB (ref 4.0–10.5)

## 2015-01-23 LAB — BASIC METABOLIC PANEL
Anion gap: 10 (ref 5–15)
BUN: 27 mg/dL — AB (ref 6–20)
CALCIUM: 8.9 mg/dL (ref 8.9–10.3)
CHLORIDE: 95 mmol/L — AB (ref 101–111)
CO2: 29 mmol/L (ref 22–32)
Creatinine, Ser: 1.02 mg/dL — ABNORMAL HIGH (ref 0.44–1.00)
GFR, EST NON AFRICAN AMERICAN: 54 mL/min — AB (ref >60)
Glucose, Bld: 122 mg/dL — ABNORMAL HIGH (ref 65–99)
Potassium: 3.8 mmol/L (ref 3.5–5.1)
Sodium: 134 mmol/L — ABNORMAL LOW (ref 135–145)

## 2015-01-23 LAB — GLUCOSE, CAPILLARY
GLUCOSE-CAPILLARY: 123 mg/dL — AB (ref 65–99)
GLUCOSE-CAPILLARY: 116 mg/dL — AB (ref 65–99)
Glucose-Capillary: 97 mg/dL (ref 65–99)
Glucose-Capillary: 131 mg/dL — ABNORMAL HIGH (ref 65–99)

## 2015-01-23 LAB — PROTIME-INR
INR: 1.3 (ref 0.00–1.49)
Prothrombin Time: 16.3 seconds — ABNORMAL HIGH (ref 11.6–15.2)

## 2015-01-23 MED ORDER — DOPAMINE-DEXTROSE 3.2-5 MG/ML-% IV SOLN
INTRAVENOUS | Status: AC
  Filled 2015-01-23: qty 250

## 2015-01-23 MED ORDER — DOPAMINE-DEXTROSE 3.2-5 MG/ML-% IV SOLN
2.5000 ug/kg/min | INTRAVENOUS | Status: DC
  Administered 2015-01-23 – 2015-01-25 (×2): 2.5 ug/kg/min via INTRAVENOUS
  Filled 2015-01-23: qty 250

## 2015-01-23 MED ORDER — IPRATROPIUM-ALBUTEROL 0.5-2.5 (3) MG/3ML IN SOLN
3.0000 mL | RESPIRATORY_TRACT | Status: DC
  Administered 2015-01-23 – 2015-01-25 (×12): 3 mL via RESPIRATORY_TRACT
  Filled 2015-01-23 (×11): qty 3

## 2015-01-23 MED ORDER — IPRATROPIUM-ALBUTEROL 0.5-2.5 (3) MG/3ML IN SOLN
RESPIRATORY_TRACT | Status: AC
  Administered 2015-01-23: 3 mL via RESPIRATORY_TRACT
  Filled 2015-01-23: qty 3

## 2015-01-23 MED ORDER — METOLAZONE 10 MG PO TABS
10.0000 mg | ORAL_TABLET | Freq: Every day | ORAL | Status: DC
  Administered 2015-01-23 – 2015-01-29 (×7): 10 mg via ORAL
  Filled 2015-01-23 (×8): qty 1

## 2015-01-23 MED ORDER — POTASSIUM CHLORIDE CRYS ER 20 MEQ PO TBCR
40.0000 meq | EXTENDED_RELEASE_TABLET | Freq: Two times a day (BID) | ORAL | Status: DC
  Administered 2015-01-23 – 2015-01-26 (×8): 40 meq via ORAL
  Filled 2015-01-23 (×9): qty 2

## 2015-01-23 NOTE — Progress Notes (Signed)
Pt attempted to get out of bed by herself pulling lines . Attempted to inform patient that she needed bipap to help with her breathing. She stated that she wants to stay with just lasix and breathing treatments. Informed pt of safety issues of trying to get up alone. Bed alarm and chair alarms in use

## 2015-01-23 NOTE — Progress Notes (Signed)
      [address]       [place] [id number]             [phone]      Feels a little less short of breath  BP 151/66 mmHg  Pulse 86  Temp(Src) 98.3 F (36.8 C) (Oral)  Resp 13  Ht 5\' 2"  (1.575 m)  Wt 255 lb 11.7 oz (116 kg)  BMI 46.76 kg/m2  SpO2 100%   Intake/Output Summary (Last 24 hours) at 01/23/15 1839 Last data filed at 01/23/15 1800  Gross per 24 hour  Intake   1213 ml  Output   1840 ml  Net   -627 ml    CXR this AM showed bibasilar atelectasis and bilateral effusions  Her UP picked up a little with zaroxylyn  Will add dopamine at 2.5 mcg/kg/min to see if that will assist with diuresis  Viviann SpareSteven C. Dorris FetchHendrickson, MD [facility] [phone]

## 2015-01-23 NOTE — Progress Notes (Signed)
Pt is pulling of bipap mask and saying she doesn't want to wear mask. Pt educated on need to wear mask, and that with her current WOB that it is likely that she will need to be intubated. Pt is able to clearly state that she knows that not wearing mask will lead to respiratory arrest, with need for possible emergent intubation. She is alert and  Oriented. Out of respect of patients right to refuse treatment, her bipap was not forced to be put back on. Will monitor.   Felipa EmoryJarrell, [employer]er Denise

## 2015-01-23 NOTE — Progress Notes (Signed)
Pt c/o too much pressure while on BIPAP. Pressure weaned from 12/5 to 10/5, pt appears comfortable. RR drops, pt falls asleep, then wakes back up stating "take it off, take it off". Explained to pt odds of being intubated are increasing if she will not wear BIPAP mask. Pt continues to state she doesn't want to wear mask. Pt pulled off BIPAP mask, placed back on 2L. Pt currently very SOB, pursed lip breathing, but continuing to refuse. RN aware. RT will continue to monitor.

## 2015-01-23 NOTE — Plan of Care (Signed)
Problem: Phase III - Recovery through Discharge Goal: Activity Progressed Outcome: Not Progressing Pt with increased W.O.B. and dyspnea with only getting out of bed. Pt refusing to ambulate even after education provided on early ambulation. Will continue to attempt progression of activity.

## 2015-01-23 NOTE — Progress Notes (Signed)
Pt with continued labored respirations and wheezing .refuses to walk . Refused flutter valve. Dr Dorris FetchHendrickson informed . Orders received

## 2015-01-23 NOTE — Progress Notes (Signed)
4 Days Post-Op Procedure(s) (LRB): AORTIC VALVE REPLACEMENT (AVR) (N/A) MAZE (N/A)  AORTIC ROOT ENLARGEMENT (N/A) TRANSESOPHAGEAL ECHOCARDIOGRAM (TEE) (N/A) Subjective: Tires easily Pain well controlled  Objective: Vital signs in last 24 hours: Temp:  [97.5 F (36.4 C)-98.9 F (37.2 C)] 97.5 F (36.4 C) (05/22 0752) Pulse Rate:  [72-101] 82 (05/22 0900) Cardiac Rhythm:  [-] Atrial fibrillation (05/22 0800) Resp:  [14-31] 29 (05/22 0900) BP: (102-163)/(47-123) 155/51 mmHg (05/22 0800) SpO2:  [92 %-100 %] 93 % (05/22 0900) Weight:  [255 lb 11.7 oz (116 kg)] 255 lb 11.7 oz (116 kg) (05/22 0500)  Hemodynamic parameters for last 24 hours:    Intake/Output from previous day: 05/21 0701 - 05/22 0700 In: 1056.5 [P.O.:150; I.V.:806.5; IV Piggyback:100] Out: 2275 [Urine:2275] Intake/Output this shift: Total I/O In: 60 [I.V.:60] Out: 90 [Urine:90]  General appearance: alert and cooperative Neurologic: intact Heart: irregularly irregular rhythm Lungs: diminished breath sounds bibasilar and wheezes bilaterally Abdomen: normal findings: soft, non-tender  Lab Results:  Recent Labs  01/22/15 0430 01/23/15 0500  WBC 11.8* 11.6*  HGB 8.5* 8.2*  HCT 26.0* 25.2*  PLT 134* 157   BMET:  Recent Labs  01/22/15 1655 01/23/15 0500  NA 136 134*  K 4.1 3.8  CL 99* 95*  CO2 29 29  GLUCOSE 128* 122*  BUN 22* 27*  CREATININE 1.04* 1.02*  CALCIUM 8.9 8.9    PT/INR:  Recent Labs  01/23/15 0500  LABPROT 16.3*  INR 1.30   ABG    Component Value Date/Time   PHART TEST WILL BE CREDITED 01/20/2015 0930   HCO3 TEST WILL BE CREDITED 01/20/2015 0930   TCO2 21 01/20/2015 1416   ACIDBASEDEF TEST WILL BE CREDITED 01/20/2015 0930   O2SAT TEST WILL BE CREDITED 01/20/2015 0930   CBG (last 3)   Recent Labs  01/22/15 1654 01/22/15 2143 01/23/15 0751  GLUCAP 116* 133* 97    Assessment/Plan: S/P Procedure(s) (LRB): AORTIC VALVE REPLACEMENT (AVR) (N/A) MAZE (N/A)  AORTIC  ROOT ENLARGEMENT (N/A) TRANSESOPHAGEAL ECHOCARDIOGRAM (TEE) (N/A) -  CV- in atrial fibrillation, rate controlled on PO amiodarone  RESP- saturations OK on 1 L [place]  Still with wheezing- continue nebs  Bibasilar atelectasis- IS  Bilateral effusions- continue diuresis  RENAL- creatinine OK, continue lasix drip  Keep Foley on lasix drip  Supplement K  ENDO- CBG well controlled  Deconditioning- mobilize as tolerated   LOS: 4 days    Loreli SlotSteven C Betzaida Cremeens 01/23/2015

## 2015-01-24 ENCOUNTER — Inpatient Hospital Stay (HOSPITAL_COMMUNITY): Payer: Medicare Other

## 2015-01-24 DIAGNOSIS — I350 Nonrheumatic aortic (valve) stenosis: Secondary | ICD-10-CM

## 2015-01-24 LAB — CBC
HEMATOCRIT: 25.5 % — AB (ref 36.0–46.0)
HEMOGLOBIN: 8.2 g/dL — AB (ref 12.0–15.0)
MCH: 29 pg (ref 26.0–34.0)
MCHC: 32.2 g/dL (ref 30.0–36.0)
MCV: 90.1 fL (ref 78.0–100.0)
PLATELETS: 187 10*3/uL (ref 150–400)
RBC: 2.83 MIL/uL — ABNORMAL LOW (ref 3.87–5.11)
RDW: 15.3 % (ref 11.5–15.5)
WBC: 10 10*3/uL (ref 4.0–10.5)

## 2015-01-24 LAB — GLUCOSE, CAPILLARY
Glucose-Capillary: 110 mg/dL — ABNORMAL HIGH (ref 65–99)
Glucose-Capillary: 144 mg/dL — ABNORMAL HIGH (ref 65–99)
Glucose-Capillary: 125 mg/dL — ABNORMAL HIGH (ref 65–99)
Glucose-Capillary: 151 mg/dL — ABNORMAL HIGH (ref 65–99)

## 2015-01-24 LAB — CARBOXYHEMOGLOBIN
CARBOXYHEMOGLOBIN: 1.9 % — AB (ref 0.5–1.5)
Methemoglobin: 1.2 % (ref 0.0–1.5)
O2 Saturation: 69.7 %
Total hemoglobin: 8.3 g/dL — ABNORMAL LOW (ref 12.0–16.0)

## 2015-01-24 LAB — BASIC METABOLIC PANEL
Anion gap: 7 (ref 5–15)
BUN: 31 mg/dL — ABNORMAL HIGH (ref 6–20)
CALCIUM: 8.9 mg/dL (ref 8.9–10.3)
CO2: 33 mmol/L — ABNORMAL HIGH (ref 22–32)
CREATININE: 1.15 mg/dL — AB (ref 0.44–1.00)
Chloride: 92 mmol/L — ABNORMAL LOW (ref 101–111)
GFR calc Af Amer: 54 mL/min — ABNORMAL LOW (ref >60)
GFR, EST NON AFRICAN AMERICAN: 46 mL/min — AB (ref >60)
Glucose, Bld: 112 mg/dL — ABNORMAL HIGH (ref 65–99)
Potassium: 4.1 mmol/L (ref 3.5–5.1)
SODIUM: 132 mmol/L — AB (ref 135–145)

## 2015-01-24 LAB — BRAIN NATRIURETIC PEPTIDE: B NATRIURETIC PEPTIDE 5: 223.3 pg/mL — AB (ref 0.0–100.0)

## 2015-01-24 LAB — PROTIME-INR
INR: 1.21 (ref 0.00–1.49)
Prothrombin Time: 15.5 seconds — ABNORMAL HIGH (ref 11.6–15.2)

## 2015-01-24 MED ORDER — CETYLPYRIDINIUM CHLORIDE 0.05 % MT LIQD
7.0000 mL | Freq: Two times a day (BID) | OROMUCOSAL | Status: DC
  Administered 2015-01-24 – 2015-01-31 (×15): 7 mL via OROMUCOSAL

## 2015-01-24 NOTE — Clinical Social Work Placement (Signed)
   CLINICAL SOCIAL WORK PLACEMENT  NOTE  Date:  01/24/2015  Patient Details  Name: Cheyenne AdasRita Halt MRN: [id number] Date of Birth: 05-Feb-1941  Clinical Social Work is seeking post-discharge placement for this patient at the Skilled  Nursing Facility level of care (*CSW will initial, date and re-position this form in  chart as items are completed):  Yes   Patient/family provided with [place] Clinical Social Work Department's list of facilities offering this level of care within the geographic area requested by the patient (or if unable, by the patient's family).  Yes   Patient/family informed of their freedom to choose among providers that offer the needed level of care, that participate in Medicare, Medicaid or managed care program needed by the patient, have an available bed and are willing to accept the patient.  Yes   Patient/family informed of [facility]'s ownership interest in [facility] and [facility], as well as of the fact that they are under no obligation to receive care at these facilities.  PASRR submitted to EDS on 01/24/15     PASRR number received on 01/24/15     Existing PASRR number confirmed on       FL2 transmitted to all facilities in geographic area requested by pt/family on 01/24/15     FL2 transmitted to all facilities within larger geographic area on       Patient informed that his/her managed care company has contracts with or will negotiate with certain facilities, including the following:            Patient/family informed of bed offers received.  Patient chooses bed at       Physician recommends and patient chooses bed at      Patient to be transferred to   on  .  Patient to be transferred to facility by       Patient family notified on   of transfer.  Name of family member notified:        PHYSICIAN Please sign FL2     Additional Comment:    _______________________________________________ Darleene CleaverAnterhaus, Kitiara Hintze R, LCSWA 01/24/2015,  4:37 PM

## 2015-01-24 NOTE — Clinical Social Work Note (Signed)
Clinical Social Work Assessment  Patient Details  Name: Sandra AdasRita Bayer MRN: [id number] Date of Birth: 1941-02-18  Date of referral:  01/24/15               Reason for consult:  Facility Placement                Permission sought to share information with:    Permission granted to share information::  Yes, Verbal Permission Granted  Name::        Agency::  Skilled Nursing Facilities admissions  Relationship::     Contact Information:     Housing/Transportation Living arrangements for the past 2 months:  Single Family Home Source of Information:  Patient Patient Interpreter Needed:  None Criminal Activity/Legal Involvement Pertinent to Current Situation/Hospitalization:  No - Comment as needed Significant Relationships:  None Lives with:  Self Do you feel safe going back to the place where you live?  Yes Need for family participation in patient care:  No (Coment) (Patient does not have any other family)  Care giving concerns:  Patient lives by herself and does not have any family or friend support.  Social Worker assessment / plan:  Patient is a 74 year old female who lives alone.  Patient is alert and oriented x4, and talkative, patient is pleasant to speak to.  Patient states she lives by herself, and does not have any family support.  Patient states she understands she is very weak and needs some short term rehab therapy in order to get her strength back up.  Patient states she has been in rehab in the past, but it has been awhile.  Patient was explained process for SNF placement, and is able to express any concerns that she has.  Patient states she understands and does not have any other questions.  Patient expressed that she is in agreement to going to a SNF for short term rehab and gave permission for CSW to start SNF placement procedure. Employment status:  Retired Health and safety inspectornsurance information:  Armed forces operational officerMedicare, [place] Recommendations:  Skilled Nursing Facility Information /  Referral to community resources:     Patient/Family's Response to care: Patient states she does not have any other concerns or issues.  Patient/Family's Understanding of and Emotional Response to Diagnosis, Current Treatment, and Prognosis:  Patient is aware how weak she is and realizes that she needs some short term rehab.  Emotional Assessment Appearance:  Appears stated Age Attitude/Demeanor/Rapport:  Lethargic Affect (typically observed):  Accepting, Pleasant, Calm Orientation:  Oriented to Self, Oriented to Place, Oriented to  Time, Oriented to Situation Alcohol / Substance use:  Not Applicable Psych involvement (Current and /or in the community):  No (Comment)  Discharge Needs  Concerns to be addressed:  Lack of Support Readmission within the last 30 days:  No Current discharge risk:  Lack of support system, Lives alone Barriers to Discharge:  No Barriers Identified   [place] [employer], Raechel Marcos R, LCSWA 01/24/2015, 4:14 PM

## 2015-01-24 NOTE — Progress Notes (Addendum)
[address]       [place] [id number]             [phone]        CARDIOTHORACIC SURGERY PROGRESS NOTE   R5 Days Post-Op Procedure(s) (LRB): AORTIC VALVE REPLACEMENT (AVR) (N/A) MAZE (N/A)  AORTIC ROOT ENLARGEMENT (N/A) TRANSESOPHAGEAL ECHOCARDIOGRAM (TEE) (N/A)  Subjective: Still gets dyspneic with activity.  Denies pain and reports breathing has improved some.  Not motivated to increase mobility or deep breathing  Objective: Vital signs: BP Readings from Last 1 Encounters:  01/24/15 149/64   Pulse Readings from Last 1 Encounters:  01/24/15 89   Resp Readings from Last 1 Encounters:  01/24/15 26   Temp Readings from Last 1 Encounters:  01/24/15 98.6 F (37 C) Oral    Hemodynamics:    Physical Exam:  Rhythm:   Afib w/ controlled rate  Breath sounds: Diminished at bases but o/w clear  Heart sounds:  RRR  Incisions:  Dressings dry, intact  Abdomen:  Soft, non-distended, non-tender  Extremities:  Warm, well-perfused    Intake/Output from previous day: 05/22 0701 - 05/23 0700 In: 2587.3 [P.O.:1980; I.V.:607.3] Out: 2730 [Urine:2730] Intake/Output this shift: Total I/O In: 25.4 [I.V.:25.4] Out: 680 [Urine:680]  Lab Results:  CBC: Recent Labs  01/23/15 0500 01/24/15 0416  WBC 11.6* 10.0  HGB 8.2* 8.2*  HCT 25.2* 25.5*  PLT 157 187    BMET:  Recent Labs  01/23/15 0500 01/24/15 0416  NA 134* 132*  K 3.8 4.1  CL 95* 92*  CO2 29 33*  GLUCOSE 122* 112*  BUN 27* 31*  CREATININE 1.02* 1.15*  CALCIUM 8.9 8.9     CBG (last 3)   Recent Labs  01/23/15 1705 01/23/15 2135 01/24/15 0739  GLUCAP 131* 116* 110*    ABG    Component Value Date/Time   PHART TEST WILL BE CREDITED 01/20/2015 0930   PCO2ART TEST WILL BE CREDITED 01/20/2015 0930   PO2ART TEST WILL BE CREDITED 01/20/2015 0930   HCO3 TEST WILL BE CREDITED 01/20/2015 0930   TCO2 21 01/20/2015 1416   ACIDBASEDEF TEST WILL BE CREDITED 01/20/2015 0930   O2SAT TEST WILL BE CREDITED 01/20/2015 0930    CXR:  PORTABLE CHEST - 1 VIEW  COMPARISON: 01/23/2015  FINDINGS: Central line tip in the lower SVC region. There continues to be bibasilar chest densities compatible pleural fluid and consolidation. Heart appears to be enlarged but obscured by the basilar chest densities. Negative for a pneumothorax. Post cardiac surgery changes.  IMPRESSION: Persistent bibasilar chest densities are compatible pleural effusions and atelectasis. Minimal change from the previous examination.   Electronically Signed  By: Richarda OverlieAdam Henn M.D.  On: 01/24/2015 07:45           Vitals     Height Weight BMI (Calculated)    5\' 2"  (1.575 m) 115.6 kg (254 lb 13.6 oz) 45.3      Interpretation Summary     CLINICAL DATA: Atelectasis.  EXAM: PORTABLE CHEST - 1 VIEW  COMPARISON: 01/23/2015  FINDINGS: Central line tip in the lower SVC region. There continues to be bibasilar chest densities compatible pleural fluid and consolidation. Heart appears to be enlarged but obscured by the basilar chest densities. Negative for a pneumothorax. Post cardiac surgery changes.  IMPRESSION: Persistent bibasilar chest densities are compatible pleural effusions and atelectasis. Minimal change from the previous examination.   Electronically Signed  By: Richarda OverlieAdam Henn M.D.  On: 01/24/2015 07:45  Assessment/Plan: S/P Procedure(s) (LRB): AORTIC VALVE REPLACEMENT (AVR) (N/A) MAZE (N/A)  AORTIC ROOT ENLARGEMENT (N/A) TRANSESOPHAGEAL ECHOCARDIOGRAM (TEE) (N/A)  Overall stable now POD5 but still w/ marginal respiratory status Maintaining rate-controlled Afib w/ stable BP O2 sats stable on 4 L/min via [place] but dyspneic with minimal activity Acute exacerbation of chronic diastolic CHF Expected post op volume excess, severe, diuresing marginally well on lasix drip I don't think the addition of low dose dopamine has helped at  all Expected post op atelectasis, severe, patient not doing well w/ mobility and deep breathing Expected post op acute blood loss anemia, stable   Increase lasix to 15 mg/hr and continue Zaroxolyn  Check 2D ECHO f/u AVR, LV function, RV function  Check co-ox and consider adding milrinone  Mobilize as much as possible  Continue coumadin, lovenox  Watch anemia  Insert PICC line and d/c old central line  Keep in SICU today   Purcell Nails 01/24/2015 9:00 AM

## 2015-01-24 NOTE — Progress Notes (Signed)
Patient ID: Sandra Turner, female   DOB: 30-Apr-1941, 74 y.o.   MRN: [id number] SICU Evening Rounds:  Hemodynamically stable  Diuresing on lasix 15 mg/hr.  Reportedly short of breath with lying down in bed or any activity. CXR this am showed marked bilat LL atelectasis and bilateral pleural effusions.   Continue IS, diuresis.

## 2015-01-24 NOTE — Progress Notes (Signed)
Will insert PICC on Tues (5/24) since pt is too short of breath today.  Primary RN spoke with MD and got the ok to wait.

## 2015-01-24 NOTE — Care Management Note (Signed)
Case Management Note  Patient Details  Name: Sandra Turner MRN: [id number] Date of Birth: [birth year]  Subjective/Objective:                  Admitted post op AVR.  Lives at home alone.  Plan for discharge is for her first cousin, [name], to be with her 24/7.  CM will continue to follow for further needs.   01-24-15 Fluid overload, acute exacerbation of CHF - on Lasix and dopamine drips.  SOB with any exertion.  Needs assist x 2 getting out of chair at this time.  May need SNF depending on progression.    Action/Plan: SW consulted to follow also.  CM will continue to follow for Discharge planning.   Expected Discharge Date:                  Expected Discharge Plan:  Home w Home Health Services  In-House Referral:     Discharge planning Services     Post Acute Care Choice:    Choice offered to:     DME Arranged:    DME Agency:     HH Arranged:    HH Agency:     Status of Service:  In process, will continue to follow  Medicare Important Message Given:   NO Date Medicare IM Given:    Medicare IM give by:    Date Additional Medicare IM Given:    Additional Medicare Important Message give by:     If discussed at Long Length of Stay Meetings, dates discussed:    Additional Comments:  Vangie BickerBrown, Kaiyu Mirabal Jane, RN 01/24/2015, 10:56 AM

## 2015-01-24 NOTE — Progress Notes (Signed)
Physical Therapy Treatment Patient Details Name: Sandra Turner Dente MRN: [id number] DOB: November 30, 1940 Today's Date: 01/24/2015    History of Present Illness Sandra Turner Rutkowski is a 74 year old female adm with dyspnea, severe aortic stenosis, and atrial fibrillation. 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    PT Comments    Pt with incr WOB on arrival. SaO2 remained 99-100% on 4L throughout session, however dyspnea limits pt's progress with activity. Encouraged to continue working on standing with nursing (even if she feels she can't walk) and educated re: how this benefits her circulation and maintaining BP when upright. (On first time standing MAP decr to 54 from 80; second standing remained 80). Anticipate slow progress with mobility due to dyspnea.   Follow Up Recommendations  SNF;Supervision/Assistance - 24 hour     Equipment Recommendations  Rolling walker with 5" wheels;3in1 (PT)    Recommendations for Other Services       Precautions / Restrictions Precautions Precautions: Fall;Sternal Precaution Comments: pt unable to state sternal prec; educated and required cues throughout Restrictions Weight Bearing Restrictions: No    Mobility  Bed Mobility               General bed mobility comments: in recliner  Transfers Overall transfer level: Needs assistance Equipment used: Rolling walker (2 wheeled) (pushing wheelchair) Transfers: Sit to/from Stand Sit to Stand: +2 physical assistance;Min assist;+2 safety/equipment         General transfer comment: vc for setup to incr ease of sit to stand; pt with good anterior wtshift with assist to extend hips/knees; vc for safe use of DME; repeat x 2 with seated rest due to dyspnea  Ambulation/Gait     Assistive device:  (pushing wheelchair)       General Gait Details: unable due to dyspnea   Stairs            Wheelchair Mobility    Modified Rankin (Stroke Patients Only)       Balance Overall  balance assessment: Needs assistance         Standing balance support: No upper extremity supported Standing balance-Leahy Scale: Poor Standing balance comment: stood for up to 45 seconds with max encouragement; pt dropped heart pillow to floor without warning each time (and grabbed handgrips of RW                    Cognition Arousal/Alertness: Awake/alert Behavior During Therapy: WFL for tasks assessed/performed Overall Cognitive Status: Within Functional Limits for tasks assessed                      Exercises General Exercises - Lower Extremity Ankle Circles/Pumps: AROM;Both;10 reps Quad Sets: AROM;Both;5 reps    General Comments        Pertinent Vitals/Pain Pain Assessment: No/denies pain    Home Living                      Prior Function            PT Goals (current goals can now be found in the care plan section) Acute Rehab PT Goals Patient Stated Goal: to go home Time For Goal Achievement: 02/04/15 Progress towards PT goals: Not progressing toward goals - comment    Frequency  Min 3X/week    PT Plan Current plan remains appropriate    Co-evaluation             End of Session Equipment Utilized During Treatment: Gait belt;Oxygen Activity  Tolerance: Patient limited by fatigue Patient left: in chair;with call bell/phone within reach;with nursing/sitter in room     Time: 1610-9604 PT Time Calculation (min) (ACUTE ONLY): 18 min  Charges:  $Therapeutic Activity: 8-22 mins                    G Codes:      Jeneal Vogl 02/10/2015, 10:42 AM Pager [phone]

## 2015-01-24 NOTE — Progress Notes (Signed)
  Echocardiogram 2D Echocardiogram - Limited Echo Attempted- has been performed.   Leta JunglingCooper, Itali Mckendry M 01/24/2015, 2:50 PM

## 2015-01-25 ENCOUNTER — Encounter (HOSPITAL_COMMUNITY): Payer: Self-pay

## 2015-01-25 ENCOUNTER — Inpatient Hospital Stay (HOSPITAL_COMMUNITY): Payer: Medicare Other

## 2015-01-25 DIAGNOSIS — J45901 Unspecified asthma with (acute) exacerbation: Secondary | ICD-10-CM

## 2015-01-25 DIAGNOSIS — Z954 Presence of other heart-valve replacement: Secondary | ICD-10-CM

## 2015-01-25 DIAGNOSIS — J810 Acute pulmonary edema: Secondary | ICD-10-CM

## 2015-01-25 LAB — CBC
HCT: 25.2 % — ABNORMAL LOW (ref 36.0–46.0)
Hemoglobin: 8.1 g/dL — ABNORMAL LOW (ref 12.0–15.0)
MCH: 29 pg (ref 26.0–34.0)
MCHC: 32.1 g/dL (ref 30.0–36.0)
MCV: 90.3 fL (ref 78.0–100.0)
Platelets: 203 10*3/uL (ref 150–400)
RBC: 2.79 MIL/uL — AB (ref 3.87–5.11)
RDW: 15.1 % (ref 11.5–15.5)
WBC: 9.1 10*3/uL (ref 4.0–10.5)

## 2015-01-25 LAB — GLUCOSE, CAPILLARY
GLUCOSE-CAPILLARY: 140 mg/dL — AB (ref 65–99)
GLUCOSE-CAPILLARY: 120 mg/dL — AB (ref 65–99)
Glucose-Capillary: 123 mg/dL — ABNORMAL HIGH (ref 65–99)
Glucose-Capillary: 119 mg/dL — ABNORMAL HIGH (ref 65–99)

## 2015-01-25 LAB — CARBOXYHEMOGLOBIN
Carboxyhemoglobin: 2.1 % — ABNORMAL HIGH (ref 0.5–1.5)
METHEMOGLOBIN: 1.3 % (ref 0.0–1.5)
O2 Saturation: 67.5 %
TOTAL HEMOGLOBIN: 8.2 g/dL — AB (ref 12.0–16.0)

## 2015-01-25 LAB — COMPREHENSIVE METABOLIC PANEL
ALBUMIN: 3.6 g/dL (ref 3.5–5.0)
ALT: 24 U/L (ref 14–54)
AST: 35 U/L (ref 15–41)
Alkaline Phosphatase: 90 U/L (ref 38–126)
Anion gap: 10 (ref 5–15)
BUN: 33 mg/dL — ABNORMAL HIGH (ref 6–20)
CO2: 34 mmol/L — ABNORMAL HIGH (ref 22–32)
CREATININE: 1.12 mg/dL — AB (ref 0.44–1.00)
Calcium: 9.2 mg/dL (ref 8.9–10.3)
Chloride: 88 mmol/L — ABNORMAL LOW (ref 101–111)
GFR calc Af Amer: 55 mL/min — ABNORMAL LOW (ref >60)
GFR calc non Af Amer: 48 mL/min — ABNORMAL LOW (ref >60)
GLUCOSE: 113 mg/dL — AB (ref 65–99)
Potassium: 4.1 mmol/L (ref 3.5–5.1)
Sodium: 132 mmol/L — ABNORMAL LOW (ref 135–145)
Total Bilirubin: 1.3 mg/dL — ABNORMAL HIGH (ref 0.3–1.2)
Total Protein: 6.1 g/dL — ABNORMAL LOW (ref 6.5–8.1)

## 2015-01-25 LAB — PROCALCITONIN: Procalcitonin: 0.12 ng/mL

## 2015-01-25 LAB — PROTIME-INR
INR: 1.26 (ref 0.00–1.49)
Prothrombin Time: 16 seconds — ABNORMAL HIGH (ref 11.6–15.2)

## 2015-01-25 LAB — BRAIN NATRIURETIC PEPTIDE: B NATRIURETIC PEPTIDE 5: 212.4 pg/mL — AB (ref 0.0–100.0)

## 2015-01-25 MED ORDER — ALBUTEROL SULFATE (2.5 MG/3ML) 0.083% IN NEBU
2.5000 mg | INHALATION_SOLUTION | RESPIRATORY_TRACT | Status: DC | PRN
  Administered 2015-01-31 – 2015-02-04 (×2): 2.5 mg via RESPIRATORY_TRACT
  Filled 2015-01-25 (×2): qty 3

## 2015-01-25 MED ORDER — BUDESONIDE 0.25 MG/2ML IN SUSP
0.2500 mg | Freq: Two times a day (BID) | RESPIRATORY_TRACT | Status: DC
  Administered 2015-01-25: 0.25 mg via RESPIRATORY_TRACT
  Filled 2015-01-25 (×3): qty 2

## 2015-01-25 MED ORDER — IPRATROPIUM-ALBUTEROL 0.5-2.5 (3) MG/3ML IN SOLN
3.0000 mL | Freq: Four times a day (QID) | RESPIRATORY_TRACT | Status: DC
  Administered 2015-01-25 – 2015-01-31 (×22): 3 mL via RESPIRATORY_TRACT
  Filled 2015-01-25 (×23): qty 3

## 2015-01-25 MED ORDER — GUAIFENESIN ER 600 MG PO TB12
1200.0000 mg | ORAL_TABLET | Freq: Two times a day (BID) | ORAL | Status: DC
  Administered 2015-01-25 – 2015-01-31 (×13): 1200 mg via ORAL
  Filled 2015-01-25 (×16): qty 2

## 2015-01-25 MED ORDER — METHYLPREDNISOLONE SODIUM SUCC 40 MG IJ SOLR
40.0000 mg | Freq: Two times a day (BID) | INTRAMUSCULAR | Status: DC
  Administered 2015-01-25 – 2015-01-27 (×4): 40 mg via INTRAVENOUS
  Filled 2015-01-25 (×6): qty 1

## 2015-01-25 MED ORDER — HEPARIN (PORCINE) IN NACL 100-0.45 UNIT/ML-% IJ SOLN
1100.0000 [IU]/h | INTRAMUSCULAR | Status: DC
  Administered 2015-01-25: 900 [IU]/h via INTRAVENOUS
  Filled 2015-01-25: qty 250

## 2015-01-25 NOTE — Consult Note (Signed)
Name: Sandra AdasRita Endicott MRN: [id number] DOB: 1941/05/30    ADMISSION DATE:  01/19/2015 CONSULTATION DATE:  01/25/2015  REFERRING MD :  Cornelius Moraswen  CHIEF COMPLAINT:  SOB  BRIEF PATIENT DESCRIPTION: 74 y.o. F who had AVR and MAZE done 5/18 (Dr. Cornelius Moraswen), developed dyspnea and hypoxia with bilateral pleural effusions and atx.  PCCM consulted for recs.  SIGNIFICANT EVENTS  5/18 - admit.  Underwent AVR with aortic root enlargement and maze procedure 5/24 - PCCM consulted for persistent dyspnea and hypoxemia  STUDIES:  CXR 5/23 >>> decreased interstitial edema, small bilateral effusions, bibasilar atx Echo 5/23 >>> limited study as pt didn't tolerate lying flat.  LVEF could not be evaluated. CT chest 5/24 >>> bilateral effusions R > L with associated atx.  Cardiomegaly with small amt of pericardial fluid. Reactive adenopathy.   HISTORY OF PRESENT ILLNESS:  Sandra Turner is a 74 y.o. F with PMH as outlined below.  She was admitted 01/19/15 for AVR, possible aortic root enlargement or root replacement, and maze procedure (Dr. Cornelius Moraswen).  Procedure was successfully done and pt tolerated well initially.  A few days post op, she began to have SOB despite supplemental O2 via [place].  CXR revealed bilateral pleural effusions and atelectasis. She was treated with lasix gtt and encouraged to be aggressive with incentive spirometry. On 5/24, she remained dyspneic and had increasing O2 requirements.  She was placed on BiPAP and did have some improvement; however, she was hesitant to continue wearing the mask.  She was sent for CT chest to further characterize her effusions / atx and assess for possible PE (though scan was done without contrast as staff was unable to get PIV in and radiology stated that central line was not approved for contrast use).  Dr. Cornelius Moraswen requested that PCCM see pt and provide further recs.  At the time of my evaluation, pt denies any fevers/chills/sweats, productive cough, chest pain, N/V/D, abdominal pain,  myalgias.  She does endorse wheezing and dyspnea and states "I'm having a hard time catching my breath".  Her symptoms are worse with exertion and with her lying flat.   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  Denies any hx of pulmonary problems and has never seen a pulmonologist.  Her pre-op PFT's showed an FEV1 of 0.91 pre / 1.06 post and a ratio of 62 pre / 63 post.  She does not need supplemental O2 at baseline.   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  has past surgical history that includes None; left and right heart catheterization with coronary angiogram (N/A, 11/01/2014); Aortic valve replacement (N/A, 01/19/2015); MAZE (N/A, 01/19/2015); Aortic root enlargement (N/A, 01/19/2015); and TEE without cardioversion (N/A, 01/19/2015). Prior to Admission medications   Medication Sig Start Date End Date Taking? Authorizing Provider  ALPHA LIPOIC ACID PO Take 1 capsule by mouth 2 (two) times daily.    Yes Historical Provider, MD  Ascorbic Acid (VITAMIN C PO) Take 1 tablet by mouth 2 (  two) times daily.    Yes Historical Provider, MD  Aspirin-Salicylamide-Caffeine (ARTHRITIS STRENGTH BC POWDER PO) Take 2 packets by mouth daily as needed (arthritis pain).   Yes Historical Provider, MD  B Complex Vitamins (B COMPLEX PO) Take 1 tablet by mouth 2 (two) times daily.    Yes Historical Provider, MD  dabigatran (PRADAXA) 150 MG CAPS  capsule TAKE ONE CAPSULE BY MOUTH EVERY 12 HOURS 09/27/14  Yes [name], MD  FOLIC ACID PO Take 1 tablet by mouth 2 (two) times daily.    Yes Historical Provider, MD  furosemide (LASIX) 20 MG tablet Take 1 tablet (20 mg total) by mouth daily. 09/25/13  Yes Standley Brooking, MD  GINKGO BILOBA PO Take 1 tablet by mouth 2 (two) times daily.    Yes Historical Provider, MD  Ginseng (GIN-ZING PO) Take 1 tablet by mouth 2 (two) times daily.    Yes Historical Provider, MD  [place] Tea, Camillia sinensis, (GREEN TEA PO) Take 1 tablet by mouth 2 (two) times daily.    Yes Historical Provider, MD  MAGNESIUM PO Take 1 tablet by mouth 2 (two) times daily.    Yes Historical Provider, MD  Methylsulfonylmethane (MSM PO) Take 2 tablets by mouth 2 (two) times daily.    Yes Historical Provider, MD  Nutritional Supplements (DHEA PO) Take 1 capsule by mouth 2 (two) times daily.   Yes Historical Provider, MD  Omega-3 Fatty Acids (FISH OIL PO) Take 2 capsules by mouth 2 (two) times daily.   Yes Historical Provider, MD  OVER THE COUNTER MEDICATION Take 1 tablet by mouth 2 (two) times daily. zinc   Yes Historical Provider, MD  Potassium 99 MG TABS Take 99 mg by mouth daily.   Yes Historical Provider, MD  traMADol (ULTRAM) 50 MG tablet Take 50 mg by mouth every 6 (six) hours as needed (pain).  05/03/14  Yes Historical Provider, MD  VITAMIN A PO Take 1 tablet by mouth 2 (two) times daily.    Yes Historical Provider, MD  VITAMIN E PO Take 1 tablet by mouth 2 (two) times daily.    Yes Historical Provider, MD  albuterol (PROVENTIL) (2.5 MG/3ML) 0.083% nebulizer solution Take 3 mLs (2.5 mg total) by nebulization every 4 (four) hours as needed for wheezing or shortness of breath. 09/15/14   Erick Blinks, MD   Allergies  Allergen Reactions  . Bee Venom Swelling  . Latex Rash  . Penicillins Rash    FAMILY HISTORY:  family history includes Breast cancer in her mother; Cancer in her father; Congestive Heart Failure in her  mother; Malignant hyperthermia in her cousin. SOCIAL HISTORY:  reports that she has never smoked. She does not have any smokeless tobacco history on file. She reports that she does not drink alcohol or use illicit drugs.  REVIEW OF SYSTEMS:   All negative; except for those that are bolded, which indicate positives.  Constitutional: weight loss, weight gain, night sweats, fevers, chills, fatigue, weakness.  HEENT: headaches, sore throat, sneezing, nasal congestion, post nasal drip, difficulty swallowing, tooth/dental problems, visual complaints, visual changes, ear aches. Neuro: difficulty with speech, weakness, numbness, ataxia. CV:  chest pain, orthopnea, PND, swelling in lower extremities, dizziness, palpitations, syncope.  Resp: cough, hemoptysis, dyspnea, wheezing. GI  heartburn, indigestion, abdominal pain, nausea, vomiting, diarrhea, constipation, change in bowel habits, loss of appetite, hematemesis, melena, hematochezia.  GU: dysuria, change in color of urine, urgency or frequency, flank pain, hematuria. MSK: joint pain or swelling, decreased range  of motion. Psych: change in mood or affect, depression, anxiety, suicidal ideations, homicidal ideations. Skin: rash, itching, bruising.   SUBJECTIVE:  Reports having trouble catching her breath.  Hesitant about wearing BiPAP but is agreeable to do so in attempt to improve her respiratory status.  VITAL SIGNS: Temp:  [97.7 F (36.5 C)-98.9 F (37.2 C)] 98.9 F (37.2 C) (05/24 1500) Pulse Rate:  [76-101] 76 (05/24 1652) Resp:  [16-31] 18 (05/24 1652) BP: (90-209)/(53-166) 130/71 mmHg (05/24 1610) SpO2:  [92 %-100 %] 100 % (05/24 1619) FiO2 (%):  [2 %-40 %] 2 % (05/24 1652) Weight:  [113.853 kg (251 lb)] 113.853 kg (251 lb) (05/24 0500)  PHYSICAL EXAMINATION: General: Obese female, resting in bed, in mild respiratory distress. Neuro: A&O x 3, non-focal.  HEENT: [place]/AT. PERRL, sclerae anicteric. Cardiovascular: RRR, no M/R/G.   Lungs: Respirations shallow.  Faint expiratory wheeze, diminished BS in bases.  Mild paradoxical breathing. Abdomen: Obese, BS x 4, soft, NT/ND.  Musculoskeletal: No gross deformities, no edema.  Skin: Intact, warm, no rashes.    Recent Labs Lab 01/23/15 0500 01/24/15 0416 01/25/15 0505  NA 134* 132* 132*  K 3.8 4.1 4.1  CL 95* 92* 88*  CO2 29 33* 34*  BUN 27* 31* 33*  CREATININE 1.02* 1.15* 1.12*  GLUCOSE 122* 112* 113*    Recent Labs Lab 01/23/15 0500 01/24/15 0416 01/25/15 0505  HGB 8.2* 8.2* 8.1*  HCT 25.2* 25.5* 25.2*  WBC 11.6* 10.0 9.1  PLT 157 187 203   Ct Chest Wo Contrast  01/25/2015   CLINICAL DATA:  Difficulty breathing post cardiac valve replacement.  EXAM: CT CHEST WITHOUT CONTRAST  TECHNIQUE: Multidetector CT imaging of the chest was performed following the standard protocol without IV contrast.  COMPARISON:  CT 08/26/2012 and chest x-ray 09/13/2014  FINDINGS: Right IJ central venous catheter has tip in the SVC. Sternotomy wires are present. Examination demonstrates a small left effusion and moderate size right pleural effusion with associated bibasilar consolidation likely compressive atelectasis, although cannot exclude infection. There findings suggesting dependent debris within the trachea which may be due to aspiration.  There is mild to moderate cardiomegaly. There is evidence of patient's recent aortic valve replacement. There is mild atherosclerotic coronary artery disease. There is a small amount of pericardial fluid. There are a few minimally prominent mediastinal lymph nodes unchanged and likely reactive. There is mild atherosclerotic plaque involving the thoracic aorta.  Images through the upper abdomen are unchanged. There are degenerative changes of the spine and shoulders. There is a moderate compression fracture over the lower thoracic spine unchanged.  IMPRESSION: Small left pleural effusion and moderate size right pleural effusion with associated  consolidation in the lower lobes likely compressive atelectasis although cannot exclude infection. Suggestion of aspirate material over the dependent portion of the mid to lower trachea.  Mild to moderate cardiomegaly with small amount of pericardial fluid. Evidence of recent aortic valve replacement. Atherosclerotic coronary artery disease.  Minimal stable mediastinal adenopathy likely reactive.  Stable moderate compression fracture in the region of the lower thoracic spine.   Electronically Signed   By: Elberta Fortis M.D.   On: 01/25/2015 18:00   Dg Chest Port 1 View  01/25/2015   CLINICAL DATA:  Status post aortic valve replacement and Maze procedure  EXAM: PORTABLE CHEST - 1 VIEW  COMPARISON:  Portable chest x-ray of Jan 24, 2015  FINDINGS: With the lungs remain hypoinflated. There are bilateral pleural effusions and bibasilar atelectasis. The cardiac silhouette remains  enlarged. The pulmonary vascularity is more distinct today with less cephalization. The 8 sternal wires are intact. A left atrial appendage clip is unchanged in position. The right internal jugular venous catheter tip projects over the midportion of the SVC.  IMPRESSION: Interval improvement in the appearance of the pulmonary interstitium with decreased interstitial edema. There remains hypoinflation, small bilateral pleural effusions, and bibasilar atelectasis.   Electronically Signed   By: David  Swaziland M.D.   On: 01/25/2015 07:33   Dg Chest Port 1 View  01/24/2015   CLINICAL DATA:  Atelectasis.  EXAM: PORTABLE CHEST - 1 VIEW  COMPARISON:  01/23/2015  FINDINGS: Central line tip in the lower SVC region. There continues to be bibasilar chest densities compatible pleural fluid and consolidation. Heart appears to be enlarged but obscured by the basilar chest densities. Negative for a pneumothorax. Post cardiac surgery changes.  IMPRESSION: Persistent bibasilar chest densities are compatible pleural effusions and atelectasis. Minimal change  from the previous examination.   Electronically Signed   By: Richarda Overlie M.D.   On: 01/24/2015 07:45    ASSESSMENT / PLAN:  Acute hypoxic respiratory failure Bilateral pleural effusions, R > L Atelectasis Doubt PNA / infectious process - history and clinical picture does not support Plan: Continuous BiPAP for now, 4 hrs on and 1 hr off. Continue supplemental O2 as needed when off BiPAP. Continue aggressive diuresis as able. D/c 1/2 NS maintenance IVF. Will assess pleural space under ultrasound in AM 5/25 for possible therapeutic and diagnostic thoracentesis (likely transudative from underlying severe AS with recent AVR and probable CHF). Continue incentive spirometry / pulmonary hygiene. Mobilize as able. Will check PCT x 1 for completeness.  If high then will re-consider abx.  Newly discovered mild obstructive lung disease as evidenced by pre-op PFT's - now with mild bronchospasm on exam but question whether she has a component of cardiac wheeze ? Plan: DuoNebs scheduled / Albuterol PRN. Trial of solumedrol  BID given mild bronchospasm. Consider establishing with [facility] as an outpatient.  Rest per primary team.   Rutherford Guys, PA - [facility] Pager: [phone]  or [phone] 01/25/2015, 6:48 PM

## 2015-01-25 NOTE — Anesthesia Postprocedure Evaluation (Signed)
  Anesthesia Post-op Note  Patient: Sandra AdasRita Turner  Procedure(s) Performed: Procedure(s): AORTIC VALVE REPLACEMENT (AVR) (N/A) MAZE (N/A)  AORTIC ROOT ENLARGEMENT (N/A) TRANSESOPHAGEAL ECHOCARDIOGRAM (TEE) (N/A)  Patient Location: SICU  Anesthesia Type:General  Level of Consciousness: sedated and Patient remains intubated per anesthesia plan  Airway and Oxygen Therapy: Patient remains intubated per anesthesia plan and Patient placed on Ventilator (see vital sign flow sheet for setting)  Post-op Pain: none  Post-op Assessment: Post-op Vital signs reviewed, Patient's Cardiovascular Status Stable, Respiratory Function Stable, Patent Airway and Pain level controlled  Post-op Vital Signs: stable  Last Vitals:  Filed Vitals:   01/25/15 0500  BP: 90/64  Pulse: 83  Temp:   Resp: 17    Complications: No apparent anesthesia complications

## 2015-01-25 NOTE — Progress Notes (Signed)
[facility] BRIEF SICU PROGRESS NOTE  6 Days Post-Op  S/P Procedure(s) (LRB): AORTIC VALVE REPLACEMENT (AVR) (N/A) MAZE (N/A)  AORTIC ROOT ENLARGEMENT (N/A) TRANSESOPHAGEAL ECHOCARDIOGRAM (TEE) (N/A)   Patient clinically did much better on BiPAP earlier today. CT angiogram never done because right IJ central line not "approved" for contrast administration, and attempts to place peripheral IV unsuccessful Non-contrast CT chest reveals severe bibasilar atelectasis and small-moderate pleural effusions R>L - I do not think there's enough pleural fluid to warrant chest tube placement at this time  Plan: Will start IV heparin w/out bolus because of persistent Afib.  This will cover the patient just in case she were to have a PE, although I doubt that she does.  I have encouraged the patient to use BiPAP as clinically this seems to have helped more than anything else.  Continue lasix drip.  Mobilize as much as possible.  Continue nebs.  Consider adding empiric antibiotics, although she has not shown signs of pneumonia.  Await input from Pulm/CCM team.  Purcell Nailslarence H Owen 01/25/2015 6:09 PM

## 2015-01-25 NOTE — Progress Notes (Signed)
ANTICOAGULATION CONSULT NOTE - Initial Consult  Pharmacy Consult for heparin Indication: atrial fibrillation  Allergies  Allergen Reactions  . Bee Venom Swelling  . Latex Rash  . Penicillins Rash    Patient Measurements: Height: 5\' 2"  (157.5 cm) Weight: 251 lb (113.853 kg) IBW/kg (Calculated) : 50.1 Heparin Dosing Weight: 69 kg  Vital Signs: Temp: 98.9 F (37.2 C) (05/24 1500) Temp Source: Axillary (05/24 1500) BP: 130/71 mmHg (05/24 1610) Pulse Rate: 76 (05/24 1652)  Labs:  Recent Labs  01/23/15 0500 01/24/15 0416 01/25/15 0505  HGB 8.2* 8.2* 8.1*  HCT 25.2* 25.5* 25.2*  PLT 157 187 203  LABPROT 16.3* 15.5* 16.0*  INR 1.30 1.21 1.26  CREATININE 1.02* 1.15* 1.12*    Estimated Creatinine Clearance: 54.2 mL/min (by C-G formula based on Cr of 1.12).   Medical History: Past Medical History  Diagnosis Date  . Arthritis   . Chronic atrial fibrillation     takes Pradaxa daily  . Aortic stenosis     Severe by echo August 2015  . Pneumonia 09/2014  . History of bronchitis 2015  . Joint pain   . Back pain     reason unknown  . Family history of adverse reaction to anesthesia     uncle with MH in the 60's,cousin in the 4370's with MH  . 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 ALPHA LIPOIC ACID PO Take 1 capsule by mouth 2 (two) times daily.    Taking  . Ascorbic Acid (VITAMIN C PO) Take 1 tablet by mouth 2 (two) times daily.    01/18/2015 at Unknown time  . Aspirin-Salicylamide-Caffeine (ARTHRITIS STRENGTH BC POWDER PO) Take 2 packets by  mouth daily as needed (arthritis pain).   01/18/2015 at Unknown time  . B Complex Vitamins (B COMPLEX PO) Take 1 tablet by mouth 2 (two) times daily.    01/18/2015 at Unknown time  . dabigatran (PRADAXA) 150 MG CAPS capsule TAKE ONE CAPSULE BY MOUTH EVERY 12 HOURS 60 capsule 6 Past Week at Unknown time  . FOLIC ACID PO Take 1 tablet by mouth 2 (two) times daily.    01/18/2015 at Unknown time  . furosemide (LASIX) 20 MG tablet Take 1 tablet (20 mg total) by mouth daily. 30 tablet 0 01/18/2015 at Unknown time  . GINKGO BILOBA PO Take 1 tablet by mouth 2 (two) times daily.    01/18/2015 at Unknown time  . Ginseng (GIN-ZING PO) Take 1 tablet by mouth 2 (two) times daily.    01/18/2015 at Unknown time  . Green Tea, Camillia sinensis, (GREEN TEA PO) Take 1 tablet by mouth 2 (two) times daily.    01/18/2015 at Unknown time  . MAGNESIUM PO Take 1 tablet by mouth 2 (two) times daily.    01/18/2015 at Unknown time  . Methylsulfonylmethane (MSM PO) Take 2 tablets by mouth 2 (two) times daily.    01/18/2015 at Unknown time  . Nutritional Supplements (DHEA PO) Take 1 capsule by mouth 2 (two) times daily.   01/18/2015 at Unknown time  . Omega-3 Fatty Acids (FISH OIL  PO) Take 2 capsules by mouth 2 (two) times daily.   Past Week at Unknown time  . OVER THE COUNTER MEDICATION Take 1 tablet by mouth 2 (two) times daily. zinc   01/18/2015 at Unknown time  . Potassium 99 MG TABS Take 99 mg by mouth daily.   01/18/2015 at Unknown time  . traMADol (ULTRAM) 50 MG tablet Take 50 mg by mouth every 6 (six) hours as needed (pain).    01/18/2015 at Unknown time  . VITAMIN A PO Take 1 tablet by mouth 2 (two) times daily.    01/18/2015 at Unknown time  . VITAMIN E PO Take 1 tablet by mouth 2 (two) times daily.    01/18/2015 at Unknown time  . albuterol (PROVENTIL) (2.5 MG/3ML) 0.083% nebulizer solution Take 3 mLs (2.5 mg total) by nebulization every 4 (four) hours as needed for wheezing or shortness of breath. 75 mL 12 More than a month at  Unknown time    Assessment: POD #6 s/p AVR. Pt was on Pradaxa prior to admission and is being transitioned to warfarin with a heparin bridge. Current INR is 1.26 after 5 days of warfarin, will likely require dose increase. Prophylactic Lovenox has been discontinued.   Goal of Therapy:  Heparin level 0.3-0.7 units/ml Monitor platelets by anticoagulation protocol: Yes   Plan:  -Heparin 900 units/hr -No heparin bolus -Daily HL, CBC -Dc heparin when INR therapeutic    Agapito Games, PharmD, BCPS Clinical Pharmacist Pager: [phone] 01/25/2015 6:12 PM

## 2015-01-25 NOTE — Progress Notes (Signed)
**Note De-Identified Sandra Turner Obfuscation** Patient removed from BIPAP and placed on 2L [place]. Patient in chair WOB has improved.  Rt to continue to monitor.

## 2015-01-25 NOTE — Progress Notes (Signed)
[address]       [place] [id number]             [phone]        CARDIOTHORACIC SURGERY PROGRESS NOTE   R6 Days Post-Op Procedure(s) (LRB): AORTIC VALVE REPLACEMENT (AVR) (N/A) MAZE (N/A)  AORTIC ROOT ENLARGEMENT (N/A) TRANSESOPHAGEAL ECHOCARDIOGRAM (TEE) (N/A)  Subjective: Feels worse.  Dyspneic at rest despite decreased O2 requirement  Objective: Vital signs: BP Readings from Last 1 Encounters:  01/25/15 132/83   Pulse Readings from Last 1 Encounters:  01/25/15 88   Resp Readings from Last 1 Encounters:  01/25/15 24   Temp Readings from Last 1 Encounters:  01/25/15 98.7 F (37.1 C) Oral    Hemodynamics:    Physical Exam:  Rhythm:   Afib w/ controlled rate  Breath sounds: Few exp wheezes, breath sounds symmetrical  Heart sounds:  irregular  Incisions:  Clean and dry  Abdomen:  Soft, non-distended, non-tender  Extremities:  Warm, well-perfused, severe bilateral LE edema   Intake/Output from previous day: 05/23 0701 - 05/24 0700 In: 1690 [P.O.:960; I.V.:730] Out: 4755 [Urine:4755] Intake/Output this shift:    Lab Results:  CBC: Recent Labs  01/24/15 0416 01/25/15 0505  WBC 10.0 9.1  HGB 8.2* 8.1*  HCT 25.5* 25.2*  PLT 187 203    BMET:  Recent Labs  01/24/15 0416 01/25/15 0505  NA 132* 132*  K 4.1 4.1  CL 92* 88*  CO2 33* 34*  GLUCOSE 112* 113*  BUN 31* 33*  CREATININE 1.15* 1.12*  CALCIUM 8.9 9.2     CBG (last 3)   Recent Labs  01/24/15 1138 01/24/15 1617 01/24/15 2134  GLUCAP 144* 125* 151*    ABG    Component Value Date/Time   PHART TEST WILL BE CREDITED 01/20/2015 0930   PCO2ART TEST WILL BE CREDITED 01/20/2015 0930   PO2ART TEST WILL BE CREDITED 01/20/2015 0930   HCO3 TEST WILL BE CREDITED 01/20/2015 0930   TCO2 21 01/20/2015 1416   ACIDBASEDEF TEST WILL BE CREDITED 01/20/2015 0930   O2SAT 67.5 01/25/2015 0510    CXR: PORTABLE CHEST - 1 VIEW  COMPARISON: Portable chest x-ray of Jan 24, 2015  FINDINGS: With the lungs remain hypoinflated. There are bilateral pleural effusions and bibasilar atelectasis. The cardiac silhouette remains enlarged. The pulmonary vascularity is more distinct today with less cephalization. The 8 sternal wires are intact. A left atrial appendage clip is unchanged in position. The right internal jugular venous catheter tip projects over the midportion of the SVC.  IMPRESSION: Interval improvement in the appearance of the pulmonary interstitium with decreased interstitial edema. There remains hypoinflation, small bilateral pleural effusions, and bibasilar atelectasis.   Electronically Signed  By: David SwazilandJordan M.D.  On: 01/25/2015 07:33  Assessment/Plan: S/P Procedure(s) (LRB): AORTIC VALVE REPLACEMENT (AVR) (N/A) MAZE (N/A)  AORTIC ROOT ENLARGEMENT (N/A) TRANSESOPHAGEAL ECHOCARDIOGRAM (TEE) (N/A)  Overall stable now POD6 but still w/ very marginal respiratory status O2 sats stable on 2 L/min via [place] but dyspneic with minimal activity Maintaining rate-controlled Afib w/ stable BP Acute exacerbation of chronic diastolic CHF, BNP level relatively low, mixed venous co-ox high, limited ECHO w/ normal aortic valve function, no significant pericardial effusion Expected post op volume excess, diuresed well yesterday on lasix drip but no symptomatic improvement Expected post op atelectasis, severe, patient not doing well w/ mobility and deep breathing CXR today actually looks slightly improved Expected post op acute blood loss anemia, stable  Will get CT angio chest to r/o PE and evaluate size of pleural effusions  Continue lasix drip  Continue nebs and add Pulmicort  Consult Pulm/CCM team to assist w/ persistent respiratory failure  Purcell Nails 01/25/2015 7:49 AM

## 2015-01-25 NOTE — Clinical Social Work Note (Signed)
CSW spoke to patient to discuss SNF placement offers, patient stated [facility] [place].  [facility] and they said there will be a female bed available on Thursday, but not today or tomorrow.  CSW to continue to follow patient throughout discharge planning.  Ervin KnackEric R. Deniro Laymon, MSW, Theresia MajorsLCSWA [phone] 01/25/2015 3:40 PM

## 2015-01-25 NOTE — Progress Notes (Signed)
Physical Therapy Treatment Patient Details Name: Sandra Turner MRN: [id number] DOB: [birth year] Today's Date: 01/25/2015    History of Present Illness Sandra Turner is a 74 year old female adm with dyspnea, severe aortic stenosis, and atrial fibrillation. 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    PT Comments    Pt continues with dyspnea limiting her activity tolerance, however was able to ambulate 10 ft, sit and rest x 10 minutes, and then ambulate 10 ft again. SaO2 >97% on 2L throughout. Continues to require max cues to maintain sternal precautions (frequently attempts to push on armrests).    Follow Up Recommendations  SNF;Supervision/Assistance - 24 hour     Equipment Recommendations  Rolling walker with 5" wheels;3in1 (PT)    Recommendations for Other Services       Precautions / Restrictions Precautions Precautions: Fall;Sternal Precaution Comments: educated and required cues throughout Restrictions Weight Bearing Restrictions: No    Mobility  Bed Mobility               General bed mobility comments: in recliner  Transfers Overall transfer level: Needs assistance Equipment used: Rolling walker (2 wheeled) Transfers: Sit to/from Stand Sit to Stand: +2 physical assistance;Min assist;+2 safety/equipment         General transfer comment: vc for setup to incr ease of sit to stand; pt with good anterior wtshift with assist to extend hips/knees; vc for safe use of DME; repeat x 2 with seated rest due to dyspnea  Ambulation/Gait Ambulation/Gait assistance: Min assist;+2 physical assistance;+2 safety/equipment Ambulation Distance (Feet): 10 Feet (seated rest, 10) Assistive device: Rolling walker (2 wheeled) Gait Pattern/deviations: Decreased stride length;Shuffle;Trunk flexed;Wide base of support Gait velocity: decr Gait velocity interpretation: Below normal speed for age/gender General Gait Details: flexed and pushing RW too far  ahead   Stairs            Wheelchair Mobility    Modified Rankin (Stroke Patients Only)       Balance           Standing balance support: Bilateral upper extremity supported Standing balance-Leahy Scale: Poor Standing balance comment: stood for pericare with bil UE support                    Cognition Arousal/Alertness: Awake/alert Behavior During Therapy: WFL for tasks assessed/performed Overall Cognitive Status: Within Functional Limits for tasks assessed                      Exercises      General Comments        Pertinent Vitals/Pain Pain Assessment: No/denies pain    Home Living                      Prior Function            PT Goals (current goals can now be found in the care plan section) Acute Rehab PT Goals Patient Stated Goal: to go home Time For Goal Achievement: 02/04/15 Progress towards PT goals: Progressing toward goals    Frequency  Min 3X/week    PT Plan Current plan remains appropriate    Co-evaluation             End of Session Equipment Utilized During Treatment: Oxygen Activity Tolerance: Patient limited by fatigue Patient left: in chair;with call bell/phone within reach;with nursing/sitter in room     Time: 1410-1441 PT Time Calculation (min) (ACUTE ONLY): 31 min  Charges:  $Gait Training: 8-22  mins $Therapeutic Activity: 8-22 mins                    G Codes:      Sandra Turner 01/25/2015, 3:39 PM Pager [phone]

## 2015-01-25 NOTE — Progress Notes (Signed)
**Note De-Identified Telitha Plath Obfuscation** Patient placed on BIPAP due to increased WOB; tolerating well.  RRT to continue to monitor.

## 2015-01-26 ENCOUNTER — Inpatient Hospital Stay (HOSPITAL_COMMUNITY): Payer: Medicare Other

## 2015-01-26 DIAGNOSIS — J90 Pleural effusion, not elsewhere classified: Secondary | ICD-10-CM | POA: Diagnosis not present

## 2015-01-26 DIAGNOSIS — J9811 Atelectasis: Secondary | ICD-10-CM | POA: Diagnosis present

## 2015-01-26 DIAGNOSIS — R0602 Shortness of breath: Secondary | ICD-10-CM | POA: Diagnosis not present

## 2015-01-26 DIAGNOSIS — J441 Chronic obstructive pulmonary disease with (acute) exacerbation: Secondary | ICD-10-CM

## 2015-01-26 LAB — CARBOXYHEMOGLOBIN
Carboxyhemoglobin: 1.7 % — ABNORMAL HIGH (ref 0.5–1.5)
METHEMOGLOBIN: 1 % (ref 0.0–1.5)
O2 Saturation: 63.3 %
TOTAL HEMOGLOBIN: 8.1 g/dL — AB (ref 12.0–16.0)

## 2015-01-26 LAB — HEPARIN LEVEL (UNFRACTIONATED): Heparin Unfractionated: 0.22 IU/mL — ABNORMAL LOW (ref 0.30–0.70)

## 2015-01-26 LAB — GLUCOSE, CAPILLARY
GLUCOSE-CAPILLARY: 63 mg/dL — AB (ref 65–99)
GLUCOSE-CAPILLARY: 131 mg/dL — AB (ref 65–99)
Glucose-Capillary: 123 mg/dL — ABNORMAL HIGH (ref 65–99)
Glucose-Capillary: 88 mg/dL (ref 65–99)

## 2015-01-26 LAB — CBC
HCT: 26 % — ABNORMAL LOW (ref 36.0–46.0)
HEMOGLOBIN: 8.2 g/dL — AB (ref 12.0–15.0)
MCH: 28.8 pg (ref 26.0–34.0)
MCHC: 31.5 g/dL (ref 30.0–36.0)
MCV: 91.2 fL (ref 78.0–100.0)
Platelets: 229 10*3/uL (ref 150–400)
RBC: 2.85 MIL/uL — ABNORMAL LOW (ref 3.87–5.11)
RDW: 14.8 % (ref 11.5–15.5)
WBC: 8.5 10*3/uL (ref 4.0–10.5)

## 2015-01-26 LAB — PROTIME-INR
INR: 1.32 (ref 0.00–1.49)
Prothrombin Time: 16.5 seconds — ABNORMAL HIGH (ref 11.6–15.2)

## 2015-01-26 LAB — BASIC METABOLIC PANEL
ANION GAP: 13 (ref 5–15)
BUN: 35 mg/dL — AB (ref 6–20)
CALCIUM: 9.3 mg/dL (ref 8.9–10.3)
CHLORIDE: 83 mmol/L — AB (ref 101–111)
CO2: 37 mmol/L — ABNORMAL HIGH (ref 22–32)
Creatinine, Ser: 1.2 mg/dL — ABNORMAL HIGH (ref 0.44–1.00)
GFR calc non Af Amer: 44 mL/min — ABNORMAL LOW (ref >60)
GFR, EST AFRICAN AMERICAN: 51 mL/min — AB (ref >60)
Glucose, Bld: 145 mg/dL — ABNORMAL HIGH (ref 65–99)
Potassium: 4.4 mmol/L (ref 3.5–5.1)
Sodium: 133 mmol/L — ABNORMAL LOW (ref 135–145)

## 2015-01-26 LAB — BRAIN NATRIURETIC PEPTIDE: B Natriuretic Peptide: 216.8 pg/mL — ABNORMAL HIGH (ref 0.0–100.0)

## 2015-01-26 MED ORDER — BUDESONIDE 0.25 MG/2ML IN SUSP
0.5000 mg | Freq: Two times a day (BID) | RESPIRATORY_TRACT | Status: DC
  Filled 2015-01-26 (×3): qty 4

## 2015-01-26 MED ORDER — BUDESONIDE 0.5 MG/2ML IN SUSP
0.5000 mg | Freq: Two times a day (BID) | RESPIRATORY_TRACT | Status: DC
  Administered 2015-01-26 – 2015-01-31 (×11): 0.5 mg via RESPIRATORY_TRACT
  Filled 2015-01-26 (×12): qty 2

## 2015-01-26 MED ORDER — SODIUM CHLORIDE 0.9 % IJ SOLN
10.0000 mL | INTRAMUSCULAR | Status: DC | PRN
  Administered 2015-01-28: 20 mL
  Administered 2015-01-28: 30 mL
  Administered 2015-02-11 – 2015-02-20 (×12): 10 mL
  Administered 2015-02-21: 20 mL
  Filled 2015-01-26 (×15): qty 40

## 2015-01-26 MED ORDER — HEPARIN (PORCINE) IN NACL 100-0.45 UNIT/ML-% IJ SOLN
1200.0000 [IU]/h | INTRAMUSCULAR | Status: DC
  Administered 2015-01-26: 1100 [IU]/h via INTRAVENOUS
  Administered 2015-01-27 – 2015-01-28 (×2): 1200 [IU]/h via INTRAVENOUS
  Filled 2015-01-26 (×6): qty 250

## 2015-01-26 MED ORDER — SODIUM CHLORIDE 0.9 % IJ SOLN
10.0000 mL | Freq: Two times a day (BID) | INTRAMUSCULAR | Status: DC
  Administered 2015-01-26 – 2015-01-28 (×4): 10 mL
  Administered 2015-01-29: 20 mL
  Administered 2015-01-29 – 2015-02-14 (×21): 10 mL

## 2015-01-26 NOTE — Progress Notes (Signed)
ANTICOAGULATION CONSULT NOTE - Follow Up Consult  Pharmacy Consult for heparin Indication: atrial fibrillation   Labs:  Recent Labs  01/24/15 0416 01/25/15 0505 01/26/15 0223 01/26/15 0235  HGB 8.2* 8.1* 8.2*  --   HCT 25.5* 25.2* 26.0*  --   PLT 187 203 229  --   LABPROT 15.5* 16.0*  --  16.5*  INR 1.21 1.26  --  1.32  HEPARINUNFRC  --   --   --  0.22*  CREATININE 1.15* 1.12*  --  1.20*     Assessment: 73yo female subtherapeutic on heparin with initial dosing for Afib.  Goal of Therapy:  Heparin level 0.3-0.7 units/ml   Plan:  Will increase heparin gtt by 2 units/kg/hr to 1100 units/hr and check level in 8hr.  Sandra Turner, PharmD, BCPS  01/26/2015,3:56 AM

## 2015-01-26 NOTE — Progress Notes (Addendum)
ANTICOAGULATION CONSULT NOTE - Follow Up Consult  Pharmacy Consult for heparin Indication: atrial fibrillation  Allergies  Allergen Reactions  . Bee Venom Swelling  . Latex Rash  . Penicillins Rash    Patient Measurements: Height: 5\' 2"  (157.5 cm) Weight: 244 lb 14.9 oz (111.1 kg) IBW/kg (Calculated) : 50.1 Heparin Dosing Weight: 69kg  Vital Signs: Temp: 98.4 F (36.9 C) (05/25 0847) Temp Source: Oral (05/25 0847) BP: 124/56 mmHg (05/25 1000) Pulse Rate: 91 (05/25 1100)  Labs:  Recent Labs  01/24/15 0416 01/25/15 0505 01/26/15 0223 01/26/15 0235  HGB 8.2* 8.1* 8.2*  --   HCT 25.5* 25.2* 26.0*  --   PLT 187 203 229  --   LABPROT 15.5* 16.0*  --  16.5*  INR 1.21 1.26  --  1.32  HEPARINUNFRC  --   --   --  0.22*  CREATININE 1.15* 1.12*  --  1.20*    Estimated Creatinine Clearance: 49.8 mL/min (by C-G formula based on Cr of 1.2).   Medications:  Scheduled:  . amiodarone  400 mg Oral Q12H   Followed by  . [START ON 01/29/2015] amiodarone  400 mg Oral Daily  . antiseptic oral rinse  7 mL Mouth Rinse BID  . aspirin EC  81 mg Oral Daily  . bisacodyl  10 mg Oral Daily   Or  . bisacodyl  10 mg Rectal Daily  . budesonide (PULMICORT) nebulizer solution  0.5 mg Nebulization BID  . docusate sodium  200 mg Oral Daily  . guaiFENesin  1,200 mg Oral BID  . insulin aspart  0-24 Units Subcutaneous TID AC & HS  . ipratropium-albuterol  3 mL Nebulization Q6H  . methylPREDNISolone (SOLU-MEDROL) injection  40 mg Intravenous Q12H  . metolazone  10 mg Oral Daily  . pantoprazole  40 mg Oral Daily  . potassium chloride  40 mEq Oral BID  . sodium chloride  10-40 mL Intracatheter Q12H  . sodium chloride  3 mL Intravenous Q12H  . warfarin  2.5 mg Oral q1800  . Warfarin - Physician Dosing Inpatient   Does not apply q1800    Assessment: 74 yo female with s/p AVR with persistent afib. She was started on heparin 5/24 and this was stopped this am for removal of pacing wires. Spoke  With [employer] Collins PA and ok to restart heparin 1 hour after the wires have been removed.   -last heparin rate was 1100 units/hr (increased from 900 units/hr due to a heparin level of 0.22)  Goal of Therapy:  Heparin level 0.3-0.7 units/ml Monitor platelets by anticoagulation protocol: Yes   Plan:  -Will restart heparin at 1100 units/hr one hour post pacer wire removal -Heparin level in 8 hours and daily wth CBC daily  Harland Germanndrew Renel Ende, Pharm D 01/26/2015 2:54 PM   Addendum:  -pacer wires removed at 3:30pm  Plan -Restart heparin at 4:30pm -Heparn level in 8 hrs  Harland GermanAndrew Lynnita Somma, Pharm D 01/26/2015 3:37 PM

## 2015-01-26 NOTE — Progress Notes (Signed)
      [address]       [place], [id number]             [phone]        CARDIOTHORACIC SURGERY PROGRESS NOTE   R7 Days Post-Op Procedure(s) (LRB): AORTIC VALVE REPLACEMENT (AVR) (N/A) MAZE (N/A)  AORTIC ROOT ENLARGEMENT (N/A) TRANSESOPHAGEAL ECHOCARDIOGRAM (TEE) (N/A)  Subjective: Less short of breath.  Rested some last night using BiPAP  Objective: Vital signs: BP Readings from Last 1 Encounters:  01/26/15 123/57   Pulse Readings from Last 1 Encounters:  01/26/15 83   Resp Readings from Last 1 Encounters:  01/26/15 25   Temp Readings from Last 1 Encounters:  01/26/15 97.8 F (36.6 C) Axillary    Hemodynamics:    Physical Exam:  Rhythm:   Afib  Breath sounds: Diminished at bases, otherwise fairly clear  Heart sounds:  irregular  Incisions:  Clean and dry  Abdomen:  Soft, non-distended, non-tender  Extremities:  Warm, well-perfused, + LE edema   Intake/Output from previous day: 05/24 0701 - 05/25 0700 In: 1216.3 [P.O.:480; I.V.:736.3] Out: 4700 [Urine:4700] Intake/Output this shift:    Lab Results:  CBC: Recent Labs  01/25/15 0505 01/26/15 0223  WBC 9.1 8.5  HGB 8.1* 8.2*  HCT 25.2* 26.0*  PLT 203 229    BMET:  Recent Labs  01/25/15 0505 01/26/15 0235  NA 132* 133*  K 4.1 4.4  CL 88* 83*  CO2 34* 37*  GLUCOSE 113* 145*  BUN 33* 35*  CREATININE 1.12* 1.20*  CALCIUM 9.2 9.3     CBG (last 3)   Recent Labs  01/25/15 1121 01/25/15 1614 01/25/15 2215  GLUCAP 140* 120* 119*    ABG    Component Value Date/Time   PHART TEST WILL BE CREDITED 01/20/2015 0930   PCO2ART TEST WILL BE CREDITED 01/20/2015 0930   PO2ART TEST WILL BE CREDITED 01/20/2015 0930   HCO3 TEST WILL BE CREDITED 01/20/2015 0930   TCO2 21 01/20/2015 1416   ACIDBASEDEF TEST WILL BE CREDITED 01/20/2015 0930   O2SAT 63.3 01/26/2015 0500    CXR: n/a  Assessment/Plan: S/P Procedure(s) (LRB): AORTIC VALVE REPLACEMENT (AVR) (N/A) MAZE (N/A)  AORTIC ROOT ENLARGEMENT (N/A) TRANSESOPHAGEAL ECHOCARDIOGRAM (TEE) (N/A)  Slowly improving now POD7  Hypoxemia and resp failure slightly improved w/ diuresis and BiPAP Post op atelectasis, severe, patient still not doing well w/ mobility and deep breathing but improved on BiPAP Remains hemodynamically stable in Afib w/ controlled rate Acute exacerbation of chronic diastolic CHF, BNP level relatively low, mixed venous co-ox high, limited ECHO w/ normal aortic valve function, no significant pericardial effusion Expected post op volume excess, diuresed well last 2 days on lasix drip, weight now reportedly below pre-op baseline but still looks edematous Expected post op acute blood loss anemia, stable but low Hgb may be contributing to symptoms   Insert PICC line and d/c old central line  Hold heparin and d/c pacing wires later today  Consider right thoracentesis per Pulm/CCM team  Continue BiPAP, nebs  Mobilize as much as possible  D/C dopamine  Continue lasix drip  Watch anemia and consider transfusion to increase O2 carrying capacity  Purcell NailsClarence H Romir Klimowicz 01/26/2015 7:25 AM

## 2015-01-26 NOTE — Progress Notes (Signed)
Peripherally Inserted Central Catheter/Midline Placement  The IV Nurse has discussed with the patient and/or persons authorized to consent for the patient, the purpose of this procedure and the potential benefits and risks involved with this procedure.  The benefits include less needle sticks, lab draws from the catheter and patient may be discharged home with the catheter.  Risks include, but not limited to, infection, bleeding, blood clot (thrombus formation), and puncture of an artery; nerve damage and irregular heat beat.  Alternatives to this procedure were also discussed.  PICC/Midline Placement Documentation  PICC / Midline Double Lumen 01/26/15 PICC Right Basilic 45 cm 0 cm (Active)  Indication for Insertion or Continuance of Line Prolonged intravenous therapies 01/26/2015 12:00 PM  Exposed Catheter (cm) 0 cm 01/26/2015 12:00 PM  Dressing Change Due 02/02/15 01/26/2015 12:00 PM       Stacie GlazeJoyce, Sidonia Nutter Horton 01/26/2015, 12:38 PM

## 2015-01-26 NOTE — Progress Notes (Signed)
Name: Sandra Turner MRN: [id number] DOB: 02-28-1941    ADMISSION DATE:  01/19/2015 CONSULTATION DATE:  01/26/2015  REFERRING MD :  Barry Dieneswens  CHIEF COMPLAINT:  Short of breath  BRIEF PATIENT DESCRIPTION:  74 yo female developed progressive dyspnea/hypoxia from AECOPD and pulmonary edema/pleural effusions after AVR with MAZE on 5/18.  SIGNIFICANT EVENTS  5/18 AVR, MAZE 5/20 Start amiodarone, start lasix gtt 5/21 wheezing  STUDIES:  01/17/15 PFT >> FEV1 1.06 (53%), FEV1% 63, TLC 4.11 (86%), DLCO 34%, +BD 01/24/15 Echo >> mild LVH 01/25/15 CT chest >> mod Rt effusion, small Lt effusion  SUBJECTIVE:  Breathing improved.  Mild chest discomfort from previous chest tube sites.  Not much cough.  VITAL SIGNS: Temp:  [97.5 F (36.4 C)-98.9 F (37.2 C)] 98.4 F (36.9 C) (05/25 0847) Pulse Rate:  [72-92] 84 (05/25 0800) Resp:  [13-34] 25 (05/25 0800) BP: (109-209)/(49-166) 113/88 mmHg (05/25 0800) SpO2:  [92 %-100 %] 92 % (05/25 0928) FiO2 (%):  [2 %-40 %] 40 % (05/25 0400) Weight:  [244 lb 14.9 oz (111.1 kg)] 244 lb 14.9 oz (111.1 kg) (05/25 0400)  PHYSICAL EXAMINATION: General: pleasant Neuro:  Alert, normal strength HEENT:  Pupils reactive Cardiovascular:  Irregular, 2/6 murmur Lungs:  Decreased BS Rt > Lt base, no wheeze Abdomen:  Soft, non tender Musculoskeletal:  1+ edema Skin:  No rashes   Recent Labs Lab 01/24/15 0416 01/25/15 0505 01/26/15 0235  NA 132* 132* 133*  K 4.1 4.1 4.4  CL 92* 88* 83*  CO2 33* 34* 37*  BUN 31* 33* 35*  CREATININE 1.15* 1.12* 1.20*  GLUCOSE 112* 113* 145*    Recent Labs Lab 01/24/15 0416 01/25/15 0505 01/26/15 0223  HGB 8.2* 8.1* 8.2*  HCT 25.5* 25.2* 26.0*  WBC 10.0 9.1 8.5  PLT 187 203 229   Ct Chest Wo Contrast  01/25/2015   CLINICAL DATA:  Difficulty breathing post cardiac valve replacement.  EXAM: CT CHEST WITHOUT CONTRAST  TECHNIQUE: Multidetector CT imaging of the chest was performed following the standard protocol  without IV contrast.  COMPARISON:  CT 08/26/2012 and chest x-ray 09/13/2014  FINDINGS: Right IJ central venous catheter has tip in the SVC. Sternotomy wires are present. Examination demonstrates a small left effusion and moderate size right pleural effusion with associated bibasilar consolidation likely compressive atelectasis, although cannot exclude infection. There findings suggesting dependent debris within the trachea which may be due to aspiration.  There is mild to moderate cardiomegaly. There is evidence of patient's recent aortic valve replacement. There is mild atherosclerotic coronary artery disease. There is a small amount of pericardial fluid. There are a few minimally prominent mediastinal lymph nodes unchanged and likely reactive. There is mild atherosclerotic plaque involving the thoracic aorta.  Images through the upper abdomen are unchanged. There are degenerative changes of the spine and shoulders. There is a moderate compression fracture over the lower thoracic spine unchanged.  IMPRESSION: Small left pleural effusion and moderate size right pleural effusion with associated consolidation in the lower lobes likely compressive atelectasis although cannot exclude infection. Suggestion of aspirate material over the dependent portion of the mid to lower trachea.  Mild to moderate cardiomegaly with small amount of pericardial fluid. Evidence of recent aortic valve replacement. Atherosclerotic coronary artery disease.  Minimal stable mediastinal adenopathy likely reactive.  Stable moderate compression fracture in the region of the lower thoracic spine.   Electronically Signed   By: Elberta Fortisaniel  Boyle M.D.   On: 01/25/2015 18:00   Dg Chest  Port 1 View  01/25/2015   CLINICAL DATA:  Status post aortic valve replacement and Maze procedure  EXAM: PORTABLE CHEST - 1 VIEW  COMPARISON:  Portable chest x-ray of Jan 24, 2015  FINDINGS: With the lungs remain hypoinflated. There are bilateral pleural effusions and  bibasilar atelectasis. The cardiac silhouette remains enlarged. The pulmonary vascularity is more distinct today with less cephalization. The 8 sternal wires are intact. A left atrial appendage clip is unchanged in position. The right internal jugular venous catheter tip projects over the midportion of the SVC.  IMPRESSION: Interval improvement in the appearance of the pulmonary interstitium with decreased interstitial edema. There remains hypoinflation, small bilateral pleural effusions, and bibasilar atelectasis.   Electronically Signed   By: David  [place] M.D.   On: 01/25/2015 07:33    ASSESSMENT / PLAN:  Acute hypoxic respiratory failure 2nd to pulmonary edema and pleural effusion, and AECOPD. Plan: - diurese as tolerated - continue solumedrol 40 mg q12h - continue pulmicort, duoneb - f/u CXR - defer thoracentesis for now - bronchial hygiene - oxygen to keep SpO2 90 to 95%  Severe AS. Chronic A fib. S/p AVR and MAZE 5/18. Plan: - post op care, amiodarone per TCTS - continue ASA - coumadin per pharmacy  Summary: She has improved with additional diuresis and treatment of COPD exacerbation.  Continue current therapies and f/u CXR.  Coralyn Helling, MD [facility] Pulmonary/Critical Care 01/26/2015, 9:47 AM Pager:  [phone] After 3pm call: [phone]

## 2015-01-26 NOTE — Progress Notes (Signed)
TCTS BRIEF SICU PROGRESS NOTE  7 Days Post-Op  S/P Procedure(s) (LRB): AORTIC VALVE REPLACEMENT (AVR) (N/A) MAZE (N/A)  AORTIC ROOT ENLARGEMENT (N/A) TRANSESOPHAGEAL ECHOCARDIOGRAM (TEE) (N/A)   Stable day but still dyspneic with activity Afib w/ controlled rate, stable BP O2 sats 97% on 2 L/min Diuresing well  Plan: Continue current plan  Purcell NailsClarence H Jaylend Reiland 01/26/2015 7:57 PM

## 2015-01-27 ENCOUNTER — Inpatient Hospital Stay (HOSPITAL_COMMUNITY): Payer: Medicare Other

## 2015-01-27 LAB — CBC
HCT: 26.9 % — ABNORMAL LOW (ref 36.0–46.0)
Hemoglobin: 8.6 g/dL — ABNORMAL LOW (ref 12.0–15.0)
MCH: 29.4 pg (ref 26.0–34.0)
MCHC: 32 g/dL (ref 30.0–36.0)
MCV: 91.8 fL (ref 78.0–100.0)
Platelets: 242 10*3/uL (ref 150–400)
RBC: 2.93 MIL/uL — AB (ref 3.87–5.11)
RDW: 14.8 % (ref 11.5–15.5)
WBC: 14.4 10*3/uL — ABNORMAL HIGH (ref 4.0–10.5)

## 2015-01-27 LAB — BASIC METABOLIC PANEL
Anion gap: 16 — ABNORMAL HIGH (ref 5–15)
BUN: 45 mg/dL — AB (ref 6–20)
CO2: 36 mmol/L — ABNORMAL HIGH (ref 22–32)
Calcium: 9.2 mg/dL (ref 8.9–10.3)
Chloride: 82 mmol/L — ABNORMAL LOW (ref 101–111)
Creatinine, Ser: 1.37 mg/dL — ABNORMAL HIGH (ref 0.44–1.00)
GFR calc non Af Amer: 38 mL/min — ABNORMAL LOW (ref >60)
GFR, EST AFRICAN AMERICAN: 43 mL/min — AB (ref >60)
Glucose, Bld: 116 mg/dL — ABNORMAL HIGH (ref 65–99)
Potassium: 4.7 mmol/L (ref 3.5–5.1)
SODIUM: 134 mmol/L — AB (ref 135–145)

## 2015-01-27 LAB — GLUCOSE, CAPILLARY
GLUCOSE-CAPILLARY: 117 mg/dL — AB (ref 65–99)
Glucose-Capillary: 166 mg/dL — ABNORMAL HIGH (ref 65–99)
Glucose-Capillary: 147 mg/dL — ABNORMAL HIGH (ref 65–99)
Glucose-Capillary: 139 mg/dL — ABNORMAL HIGH (ref 65–99)
Glucose-Capillary: 128 mg/dL — ABNORMAL HIGH (ref 65–99)

## 2015-01-27 LAB — PROTIME-INR
INR: 1.39 (ref 0.00–1.49)
Prothrombin Time: 17.1 seconds — ABNORMAL HIGH (ref 11.6–15.2)

## 2015-01-27 LAB — HEPARIN LEVEL (UNFRACTIONATED)
HEPARIN UNFRACTIONATED: 0.29 [IU]/mL — AB (ref 0.30–0.70)
Heparin Unfractionated: 0.33 IU/mL (ref 0.30–0.70)

## 2015-01-27 MED ORDER — FUROSEMIDE 10 MG/ML IJ SOLN
80.0000 mg | Freq: Once | INTRAMUSCULAR | Status: AC
  Administered 2015-01-27: 80 mg via INTRAVENOUS
  Filled 2015-01-27: qty 8

## 2015-01-27 MED ORDER — FUROSEMIDE 10 MG/ML IJ SOLN
80.0000 mg | Freq: Two times a day (BID) | INTRAMUSCULAR | Status: DC
  Administered 2015-01-28 – 2015-01-30 (×5): 80 mg via INTRAVENOUS
  Filled 2015-01-27 (×8): qty 8

## 2015-01-27 MED ORDER — WARFARIN SODIUM 5 MG PO TABS
5.0000 mg | ORAL_TABLET | Freq: Every day | ORAL | Status: DC
  Administered 2015-01-27 – 2015-01-30 (×4): 5 mg via ORAL
  Filled 2015-01-27 (×5): qty 1

## 2015-01-27 MED ORDER — METHYLPREDNISOLONE SODIUM SUCC 40 MG IJ SOLR
40.0000 mg | INTRAMUSCULAR | Status: DC
  Administered 2015-01-28: 40 mg via INTRAVENOUS
  Filled 2015-01-27 (×2): qty 1

## 2015-01-27 NOTE — Progress Notes (Signed)
Patient ID: Sandra Turner, female   DOB: [birth year], 74 y.o.   MRN: [id number] EVENING ROUNDS NOTE :     [address]       [place] [id number]             [phone]                 8 Days Post-Op Procedure(s) (LRB): AORTIC VALVE REPLACEMENT (AVR) (N/A) MAZE (N/A)  AORTIC ROOT ENLARGEMENT (N/A) TRANSESOPHAGEAL ECHOCARDIOGRAM (TEE) (N/A)  Total Length of Stay:  LOS: 8 days  BP 140/49 mmHg  Pulse 79  Temp(Src) 98.3 F (36.8 C) (Oral)  Resp 23  Ht 5\' 2"  (1.575 m)  Wt 244 lb 11.4 oz (111 kg)  BMI 44.75 kg/m2  SpO2 100%  .Intake/Output      05/26 0701 - 05/27 0700   P.O. 540   I.V. (mL/kg) 153.3 (1.4)   Total Intake(mL/kg) 693.3 (6.2)   Urine (mL/kg/hr) 1620 (1.2)   Stool 0 (0)   Total Output 1620   Net -926.8       Stool Occurrence 4 x     . sodium chloride Stopped (01/22/15 0000)  . heparin 1,200 Units/hr (01/27/15 1900)     Lab Results  Component Value Date   WBC 14.4* 01/27/2015   HGB 8.6* 01/27/2015   HCT 26.9* 01/27/2015   PLT 242 01/27/2015   GLUCOSE 116* 01/27/2015   ALT 24 01/25/2015   AST 35 01/25/2015   NA 134* 01/27/2015   K 4.7 01/27/2015   CL 82* 01/27/2015   CREATININE 1.37* 01/27/2015   BUN 45* 01/27/2015   CO2 36* 01/27/2015   TSH 0.916 08/22/2012   INR 1.39 01/27/2015   HGBA1C 5.3 01/17/2015   Slow improvement , walked little today   Delight OvensEdward B Joziah Dollins MD  Beeper [phone] Office [phone] 01/27/2015 7:30 PM

## 2015-01-27 NOTE — Progress Notes (Signed)
ANTICOAGULATION CONSULT NOTE - Follow Up Consult  Pharmacy Consult for heparin Indication: atrial fibrillation  Allergies  Allergen Reactions  . Bee Venom Swelling  . Latex Rash  . Penicillins Rash    Patient Measurements: Height: 5\' 2"  (157.5 cm) Weight: 244 lb 11.4 oz (111 kg) IBW/kg (Calculated) : 50.1 Heparin Dosing Weight: 69kg  Vital Signs: Temp: 98.1 F (36.7 C) (05/26 1100) Temp Source: Oral (05/26 1100) BP: 156/46 mmHg (05/26 1050) Pulse Rate: 85 (05/26 1050)  Labs:  Recent Labs  01/25/15 0505 01/26/15 0223 01/26/15 0235 01/27/15 0140 01/27/15 0142 01/27/15 1046  HGB 8.1* 8.2*  --  8.6*  --   --   HCT 25.2* 26.0*  --  26.9*  --   --   PLT 203 229  --  242  --   --   LABPROT 16.0*  --  16.5* 17.1*  --   --   INR 1.26  --  1.32 1.39  --   --   HEPARINUNFRC  --   --  0.22*  --  0.33 0.29*  CREATININE 1.12*  --  1.20* 1.37*  --   --     Estimated Creatinine Clearance: 43.7 mL/min (by C-G formula based on Cr of 1.37).   Medications:  Scheduled:  . amiodarone  400 mg Oral Q12H   Followed by  . [START ON 01/29/2015] amiodarone  400 mg Oral Daily  . antiseptic oral rinse  7 mL Mouth Rinse BID  . aspirin EC  81 mg Oral Daily  . bisacodyl  10 mg Oral Daily   Or  . bisacodyl  10 mg Rectal Daily  . budesonide (PULMICORT) nebulizer solution  0.5 mg Nebulization BID  . docusate sodium  200 mg Oral Daily  . [START ON 01/28/2015] furosemide  80 mg Intravenous BID  . furosemide  80 mg Intravenous Once  . guaiFENesin  1,200 mg Oral BID  . insulin aspart  0-24 Units Subcutaneous TID AC & HS  . ipratropium-albuterol  3 mL Nebulization Q6H  . [START ON 01/28/2015] methylPREDNISolone (SOLU-MEDROL) injection  40 mg Intravenous Q24H  . metolazone  10 mg Oral Daily  . pantoprazole  40 mg Oral Daily  . sodium chloride  10-40 mL Intracatheter Q12H  . sodium chloride  3 mL Intravenous Q12H  . warfarin  5 mg Oral q1800  . Warfarin - Physician Dosing Inpatient   Does not  apply q1800    Assessment: 74 yo female with s/p AVR with persistent afib. She was started on heparin 5/24 and this was stopped thisfor removal of pacing wires on 5/25 and was resumed. Heparin level is 0.29 and just below goal on 110 units/hr. MD is dosing coumadin.   Goal of Therapy:  Heparin level 0.3-0.7 units/ml Monitor platelets by anticoagulation protocol: Yes  Plan -Increase heparin to 1200 units/hr -Heparin level and CBC in am  Harland GermanAndrew Cady Hafen, Pharm D 01/27/2015 12:38 PM

## 2015-01-27 NOTE — Progress Notes (Signed)
Physical Therapy Treatment Patient Details Name: Sandra Turner MRN: [id number] DOB: 05-18-1941 Today's Date: 01/27/2015    History of Present Illness Sandra Turner is a 74 year old female adm with dyspnea, severe aortic stenosis, and atrial fibrillation. 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    PT Comments    Did agree to participate with PT this pm.  Patient making slow progress with mobility.  Anxious, with increased dyspnea with mobility.  O2 sats remained 94-98%.  Agree with need for SNF at discharge.  Follow Up Recommendations  SNF;Supervision/Assistance - 24 hour     Equipment Recommendations  Rolling walker with 5" wheels;3in1 (PT)    Recommendations for Other Services       Precautions / Restrictions Precautions Precautions: Fall;Sternal Precaution Comments: educated and required cues throughout Restrictions Weight Bearing Restrictions: Yes Other Position/Activity Restrictions: Sternal precautions    Mobility  Bed Mobility Overal bed mobility: Needs Assistance Bed Mobility: Sit to Supine       Sit to supine: Mod assist   General bed mobility comments: Verbal cues for technique while maintaining sternal precautions.  Assist to control descent of trunk, and to bring LE's onto bed.  Required +2 assist to scoot up toward HOB in supine.  Transfers Overall transfer level: Needs assistance Equipment used: 1 person hand held assist Transfers: Sit to/from Stand Sit to Stand: +2 physical assistance;Min assist;+2 safety/equipment         General transfer comment: Verbal cues for technique.  Patient holding pillow, with assist to translate trunk forward.  [facility]  Stood x 2 minutes for pericare.  Ambulation/Gait Ambulation/Gait assistance: Min assist;+2 safety/equipment Ambulation Distance (Feet): 12 Feet Assistive device: 1 person hand held assist Gait Pattern/deviations: Step-through  pattern;Decreased stride length;Shuffle;Trunk flexed Gait velocity: Decreased Gait velocity interpretation: Below normal speed for age/gender General Gait Details: Verbal cues to remain upright.  Patient able to take short shuffling steps to walk BSC to bed.  Patient stood x 2 minutes for strengthening/endurance at EOB.  Assist to scoot back onto bed.  Dyspnea 4/4 with mobility, however O2 sats remain in 90's (94-98%)   Stairs            Wheelchair Mobility    Modified Rankin (Stroke Patients Only)       Balance           Standing balance support: Bilateral upper extremity supported Standing balance-Leahy Scale: Poor                      Cognition Arousal/Alertness: Awake/alert Behavior During Therapy: WFL for tasks assessed/performed;Anxious Overall Cognitive Status: Within Functional Limits for tasks assessed                      Exercises General Exercises - Lower Extremity Ankle Circles/Pumps: AROM;Both;10 reps;Supine Quad Sets: AROM;Both;10 reps;Supine Gluteal Sets: AROM;Both;10 reps;Supine Hip ABduction/ADduction: AAROM;Both;5 reps;Supine    General Comments        Pertinent Vitals/Pain Pain Assessment: 0-10 Pain Score: 6  Pain Location: Generalized, especially in knees Pain Descriptors / Indicators: Aching;Sore Pain Intervention(s): Monitored during session;Repositioned    Home Living                      Prior Function            PT Goals (current goals can now be found in the care plan section) Progress towards PT goals: Progressing  toward goals    Frequency  Min 3X/week    PT Plan Current plan remains appropriate    Co-evaluation             End of Session Equipment Utilized During Treatment: Oxygen Activity Tolerance: Patient limited by fatigue Patient left: in bed;with call bell/phone within reach     Time: 1643-1711 PT Time Calculation (min) (ACUTE ONLY): 28 min  Charges:  $Therapeutic  Exercise: 8-22 mins $Therapeutic Activity: 8-22 mins                    G Codes:      Vena Austria 2015-02-22, 5:47 PM Durenda Hurt. Renaldo Fiddler, [facility] Acute Rehab Services Pager [phone]

## 2015-01-27 NOTE — Progress Notes (Signed)
Name: Sandra AdasRita Slagter MRN: [id number] DOB: [birth year]    ADMISSION DATE:  01/19/2015 CONSULTATION DATE:  01/26/2015  REFERRING MD :  Barry Dieneswens  CHIEF COMPLAINT:  Short of breath  BRIEF PATIENT DESCRIPTION:  74 yo female developed progressive dyspnea/hypoxia from AECOPD and pulmonary edema/pleural effusions after AVR with MAZE on 5/18.  SIGNIFICANT EVENTS  5/18 AVR, MAZE 5/20 Start amiodarone, start lasix gtt 5/21 wheezing  STUDIES:  01/17/15 PFT >> FEV1 1.06 (53%), FEV1% 63, TLC 4.11 (86%), DLCO 34%, +BD 01/24/15 Echo >> mild LVH 01/25/15 CT chest >> mod Rt effusion, small Lt effusion  SUBJECTIVE:  Breathing improving oob to chair C/o mucus stuck in throat   VITAL SIGNS: Temp:  [97.4 F (36.3 C)-98.4 F (36.9 C)] 97.4 F (36.3 C) (05/26 0409) Pulse Rate:  [70-91] 75 (05/26 0600) Resp:  [15-30] 24 (05/26 0600) BP: (91-162)/(43-88) 108/45 mmHg (05/26 0600) SpO2:  [88 %-100 %] 98 % (05/26 0600) FiO2 (%):  [40 %] 40 % (05/26 0401) Weight:  [111 kg (244 lb 11.4 oz)] 111 kg (244 lb 11.4 oz) (05/26 0500)  PHYSICAL EXAMINATION: General: pleasant Neuro:  Alert, normal strength HEENT:  Pupils reactive Cardiovascular:  Irregular, 2/6 murmur Lungs:  Decreased BS Rt > Lt base, no wheeze Abdomen:  Soft, non tender Musculoskeletal:  1+ edema Skin:  No rashes   Recent Labs Lab 01/25/15 0505 01/26/15 0235 01/27/15 0140  NA 132* 133* 134*  K 4.1 4.4 4.7  CL 88* 83* 82*  CO2 34* 37* 36*  BUN 33* 35* 45*  CREATININE 1.12* 1.20* 1.37*  GLUCOSE 113* 145* 116*    Recent Labs Lab 01/25/15 0505 01/26/15 0223 01/27/15 0140  HGB 8.1* 8.2* 8.6*  HCT 25.2* 26.0* 26.9*  WBC 9.1 8.5 14.4*  PLT 203 229 242   Ct Chest Wo Contrast  01/25/2015   CLINICAL DATA:  Difficulty breathing post cardiac valve replacement.  EXAM: CT CHEST WITHOUT CONTRAST  TECHNIQUE: Multidetector CT imaging of the chest was performed following the standard protocol without IV contrast.  COMPARISON:  CT  08/26/2012 and chest x-ray 09/13/2014  FINDINGS: Right IJ central venous catheter has tip in the SVC. Sternotomy wires are present. Examination demonstrates a small left effusion and moderate size right pleural effusion with associated bibasilar consolidation likely compressive atelectasis, although cannot exclude infection. There findings suggesting dependent debris within the trachea which may be due to aspiration.  There is mild to moderate cardiomegaly. There is evidence of patient's recent aortic valve replacement. There is mild atherosclerotic coronary artery disease. There is a small amount of pericardial fluid. There are a few minimally prominent mediastinal lymph nodes unchanged and likely reactive. There is mild atherosclerotic plaque involving the thoracic aorta.  Images through the upper abdomen are unchanged. There are degenerative changes of the spine and shoulders. There is a moderate compression fracture over the lower thoracic spine unchanged.  IMPRESSION: Small left pleural effusion and moderate size right pleural effusion with associated consolidation in the lower lobes likely compressive atelectasis although cannot exclude infection. Suggestion of aspirate material over the dependent portion of the mid to lower trachea.  Mild to moderate cardiomegaly with small amount of pericardial fluid. Evidence of recent aortic valve replacement. Atherosclerotic coronary artery disease.  Minimal stable mediastinal adenopathy likely reactive.  Stable moderate compression fracture in the region of the lower thoracic spine.   Electronically Signed   By: Elberta Fortisaniel  Boyle M.D.   On: 01/25/2015 18:00   Dg Chest Port 1 View  01/26/2015  CLINICAL DATA:  PICC placement.  EXAM: PORTABLE CHEST - 1 VIEW  COMPARISON:  CT chest and chest radiograph 01/25/2015.  FINDINGS: Right PICC tip projects over the SVC. Right IJ central line tip projects over the low SVC. Heart size is grossly stable. Thoracic aorta is calcified.  Lungs are low in volume with bibasilar airspace consolidation and bilateral pleural effusions.  IMPRESSION: Bibasilar airspace consolidation and bilateral pleural effusions.   Electronically Signed   By: Leanna Battles M.D.   On: 01/26/2015 13:16    ASSESSMENT / PLAN:  Acute hypoxic respiratory failure 2nd to pulmonary edema and pleural effusions, PFTs from 5/16-shows FEV1 45% with improvement postbronchodilator - we'll treat as asthma in this nonsmoker Plan: - diurese as tolerated - decrease  solumedrol 40 mg q24h - continue pulmicort, duoneb - defer thoracentesis for now - bronchial hygiene - oxygen to keep SpO2 90 to 95% -ct mucinex, mobilise  Severe AS. Chronic A fib. S/p AVR and MAZE 5/18. Plan: - post op care, amiodarone per TCTS - continue ASA - ct he[parin, coumadin per pharmacy -decrease lasix since cr rising  Summary: She has improved with additional diuresis and treatment of airway disease  D/w Dr Cornelius Moras  Cyril Mourning MD. [facility]. [place] [facility] Pager [phone] If no response call [phone]    01/27/2015, 7:56 AM

## 2015-01-27 NOTE — Progress Notes (Addendum)
[address]       [name] [address]             [phone]        CARDIOTHORACIC SURGERY PROGRESS NOTE   R8 Days Post-Op Procedure(s) (LRB): AORTIC VALVE REPLACEMENT (AVR) (N/A) MAZE (N/A)  AORTIC ROOT ENLARGEMENT (N/A) TRANSESOPHAGEAL ECHOCARDIOGRAM (TEE) (N/A)  Subjective: Looks much better.  Still complaining and not motivated.  She thinks that she might have Legionaire's disease.  Objective: Vital signs: BP Readings from Last 1 Encounters:  01/27/15 108/45   Pulse Readings from Last 1 Encounters:  01/27/15 75   Resp Readings from Last 1 Encounters:  01/27/15 24   Temp Readings from Last 1 Encounters:  01/27/15 98.7 F (37.1 C) Oral    Hemodynamics:    Physical Exam:  Rhythm:   Afib  Breath sounds: Fairly clear but diminished at bases  Heart sounds:  irregular  Incisions:  Clean and dry  Abdomen:  Soft, non-distended, non-tender  Extremities:  Warm, well-perfused,  LE edema improved   Intake/Output from previous day: 05/25 0701 - 05/26 0700 In: 1622.6 [P.O.:1080; I.V.:542.6] Out: 3190 [Urine:3190] Intake/Output this shift:    Lab Results:  CBC: Recent Labs  01/26/15 0223 01/27/15 0140  WBC 8.5 14.4*  HGB 8.2* 8.6*  HCT 26.0* 26.9*  PLT 229 242    BMET:  Recent Labs  01/26/15 0235 01/27/15 0140  NA 133* 134*  K 4.4 4.7  CL 83* 82*  CO2 37* 36*  GLUCOSE 145* 116*  BUN 35* 45*  CREATININE 1.20* 1.37*  CALCIUM 9.3 9.2     CBG (last 3)   Recent Labs  01/26/15 1541 01/26/15 1626 01/26/15 2140  GLUCAP 63* 88 131*    ABG    Component Value Date/Time   PHART TEST WILL BE CREDITED 01/20/2015 0930   PCO2ART TEST WILL BE CREDITED 01/20/2015 0930   PO2ART TEST WILL BE CREDITED 01/20/2015 0930   HCO3 TEST WILL BE CREDITED 01/20/2015 0930   TCO2 21 01/20/2015 1416   ACIDBASEDEF TEST WILL BE CREDITED 01/20/2015 0930   O2SAT 63.3 01/26/2015 0500    CXR: PORTABLE CHEST - 1 VIEW  COMPARISON: Portable  chest x-ray of 01/26/2015  FINDINGS: The lungs are very poorly aerated. There are basilar opacities consistent with effusions and atelectasis. There is cardiomegaly present and there may be and element of pulmonary vascular congestion as well. A right PICC line remains. The right IJ central venous line has been removed.  IMPRESSION: Little change in very poor aeration with basilar opacities consistent with effusions and atelectasis. Question mild pulmonary vascular congestion as well.   Electronically Signed  By: Dwyane Dee M.D.  On: 01/27/2015 08:01  Assessment/Plan: S/P Procedure(s) (LRB): AORTIC VALVE REPLACEMENT (AVR) (N/A) MAZE (N/A)  AORTIC ROOT ENLARGEMENT (N/A) TRANSESOPHAGEAL ECHOCARDIOGRAM (TEE) (N/A)  Slowly improving now POD8 Hypoxemia and resp failure slightly improved w/ diuresis and BiPAP Post op atelectasis, severe, patient still not doing well w/ mobility and deep breathing but improved on BiPAP Remains hemodynamically stable in Afib w/ controlled rate Acute exacerbation of chronic diastolic CHF, BNP level relatively low, mixed venous co-ox high, limited ECHO w/ normal aortic valve function, no significant pericardial effusion Expected post op volume excess, diuresed well last 3 days on lasix drip, weight now reportedly below pre-op baseline and BUN/creatinine rising Expected post op acute blood loss anemia, Hgb up slightly but still may be contributing to symptoms Leukocytosis, mild - likely related to  steroids   Consider right thoracentesis per Pulm/CCM team  Continue BiPAP, nebs  Mobilize as much as possible  D/C lasix drip  D/C foley  Watch WBC and consider adding empiric antibiotics for HCAP if needed  Watch anemia and consider transfusion to increase O2 carrying capacity  Increase coumadin dose  Transfer step down once resp status improves  Purcell NailsClarence H Keiera Strathman 01/27/2015 8:37 AM

## 2015-01-27 NOTE — Progress Notes (Signed)
PCCM Interval Progress Note  Bedside ultrasound assessment of right pleural space:                                      Left pleural space:      Will discuss above findings with attending.  Likely defer thoracentesis for now given mild effusions.   Sandra Turner, [place] - [place] [facility] Pager: [phone]  or [phone] 01/27/2015, 12:05 PM

## 2015-01-27 NOTE — Progress Notes (Signed)
ANTICOAGULATION CONSULT NOTE - Follow Up Consult  Pharmacy Consult for heparin Indication: atrial fibrillation  Allergies  Allergen Reactions  . Bee Venom Swelling  . Latex Rash  . Penicillins Rash    Patient Measurements: Height: 5\' 2"  (157.5 cm) Weight: 244 lb 14.9 oz (111.1 kg) IBW/kg (Calculated) : 50.1 Heparin Dosing Weight: 69kg  Vital Signs: Temp: 97.7 F (36.5 C) (05/25 2350) Temp Source: Oral (05/25 2350) BP: 162/66 mmHg (05/26 0100) Pulse Rate: 80 (05/26 0100)  Labs:  Recent Labs  01/24/15 0416 01/25/15 0505 01/26/15 0223 01/26/15 0235 01/27/15 0140 01/27/15 0142  HGB 8.2* 8.1* 8.2*  --   --   --   HCT 25.5* 25.2* 26.0*  --   --   --   PLT 187 203 229  --   --   --   LABPROT 15.5* 16.0*  --  16.5* 17.1*  --   INR 1.21 1.26  --  1.32 1.39  --   HEPARINUNFRC  --   --   --  0.22*  --  0.33  CREATININE 1.15* 1.12*  --  1.20*  --   --     Estimated Creatinine Clearance: 49.8 mL/min (by C-G formula based on Cr of 1.2).   Medications:  Scheduled:  . amiodarone  400 mg Oral Q12H   Followed by  . [START ON 01/29/2015] amiodarone  400 mg Oral Daily  . antiseptic oral rinse  7 mL Mouth Rinse BID  . aspirin EC  81 mg Oral Daily  . bisacodyl  10 mg Oral Daily   Or  . bisacodyl  10 mg Rectal Daily  . budesonide (PULMICORT) nebulizer solution  0.5 mg Nebulization BID  . docusate sodium  200 mg Oral Daily  . guaiFENesin  1,200 mg Oral BID  . insulin aspart  0-24 Units Subcutaneous TID AC & HS  . ipratropium-albuterol  3 mL Nebulization Q6H  . methylPREDNISolone (SOLU-MEDROL) injection  40 mg Intravenous Q12H  . metolazone  10 mg Oral Daily  . pantoprazole  40 mg Oral Daily  . potassium chloride  40 mEq Oral BID  . sodium chloride  10-40 mL Intracatheter Q12H  . sodium chloride  3 mL Intravenous Q12H  . warfarin  2.5 mg Oral q1800  . Warfarin - Physician Dosing Inpatient   Does not apply q1800    Assessment: 74 yo female with s/p AVR with persistent  afib. She was started on heparin 5/24 and this was stopped this am for removal of pacing wires. Spoke With [employer] Collins PA and ok to restart heparin 1 hour after the wires have been removed.   -last heparin rate was 1100 units/hr (increased from 900 units/hr due to a heparin level of 0.22)  Initial HL is therapeutic at 0.33 on heparin 1100 units/hr. No issues with infusion or bleeding noted.  Goal of Therapy:  Heparin level 0.3-0.7 units/ml Monitor platelets by anticoagulation protocol: Yes  Plan Continue heparin 1100 units/hr Confirmatory 8h HL Daily HL/CBC  Arlean Hoppingorey M. Newman PiesBall, PharmD Clinical Pharmacist Pager [phone] 01/27/2015 2:42 AM

## 2015-01-27 NOTE — Progress Notes (Signed)
PT Cancellation Note  Patient Details Name: Sandra Turner MRN: [id number] DOB: September 23, 1940   Cancelled Treatment:    Reason Eval/Treat Not Completed: Fatigue/lethargy limiting ability to participate   Pt up in chair since 6:00 a.m, walked 20 ft with nursing, [facility] multiple tests/xray (per pt) and she adamantly refused to attempt any therapy at this time.   Areg Bialas 01/27/2015, 2:16 PM  Pager [phone]

## 2015-01-27 NOTE — Clinical Social Work Note (Addendum)
CSW spoke to patient, who said [facility] [place] is a bed available for her once she is ready to discharge.  [facility] and [facility] said once she is ready for discharge they can take her.  CSW informed SNF it may be next week or over the weekend depending on when discharge orders are received and patient is medically ready.  [facility] [place] to find out if they could take weekend admissions, and they said yes to notify admissions worker.  CSW will continue to follow patient for discharge planning.  Ervin KnackEric R. Niko Penson, MSW, Theresia MajorsLCSWA [phone] 01/27/2015 11:36 AM

## 2015-01-28 ENCOUNTER — Inpatient Hospital Stay (HOSPITAL_COMMUNITY): Payer: Medicare Other

## 2015-01-28 LAB — CBC
HCT: 24.3 % — ABNORMAL LOW (ref 36.0–46.0)
HEMOGLOBIN: 7.7 g/dL — AB (ref 12.0–15.0)
MCH: 29.1 pg (ref 26.0–34.0)
MCHC: 31.7 g/dL (ref 30.0–36.0)
MCV: 91.7 fL (ref 78.0–100.0)
PLATELETS: 288 10*3/uL (ref 150–400)
RBC: 2.65 MIL/uL — AB (ref 3.87–5.11)
RDW: 14.9 % (ref 11.5–15.5)
WBC: 16.7 10*3/uL — AB (ref 4.0–10.5)

## 2015-01-28 LAB — BASIC METABOLIC PANEL
Anion gap: 13 (ref 5–15)
BUN: 54 mg/dL — ABNORMAL HIGH (ref 6–20)
CALCIUM: 8.9 mg/dL (ref 8.9–10.3)
CHLORIDE: 80 mmol/L — AB (ref 101–111)
CO2: 39 mmol/L — AB (ref 22–32)
Creatinine, Ser: 1.39 mg/dL — ABNORMAL HIGH (ref 0.44–1.00)
GFR calc Af Amer: 43 mL/min — ABNORMAL LOW (ref >60)
GFR calc non Af Amer: 37 mL/min — ABNORMAL LOW (ref >60)
GLUCOSE: 109 mg/dL — AB (ref 65–99)
Potassium: 3.7 mmol/L (ref 3.5–5.1)
SODIUM: 132 mmol/L — AB (ref 135–145)

## 2015-01-28 LAB — GLUCOSE, CAPILLARY
GLUCOSE-CAPILLARY: 198 mg/dL — AB (ref 65–99)
Glucose-Capillary: 107 mg/dL — ABNORMAL HIGH (ref 65–99)
Glucose-Capillary: 121 mg/dL — ABNORMAL HIGH (ref 65–99)

## 2015-01-28 LAB — PROTIME-INR
INR: 1.51 — AB (ref 0.00–1.49)
PROTHROMBIN TIME: 18.3 s — AB (ref 11.6–15.2)

## 2015-01-28 LAB — PREPARE RBC (CROSSMATCH)

## 2015-01-28 LAB — HEPARIN LEVEL (UNFRACTIONATED): HEPARIN UNFRACTIONATED: 0.59 [IU]/mL (ref 0.30–0.70)

## 2015-01-28 MED ORDER — PREDNISONE 20 MG PO TABS
30.0000 mg | ORAL_TABLET | Freq: Every day | ORAL | Status: DC
  Administered 2015-01-29 – 2015-01-31 (×3): 30 mg via ORAL
  Filled 2015-01-28 (×4): qty 1

## 2015-01-28 MED ORDER — METOPROLOL TARTRATE 12.5 MG HALF TABLET
12.5000 mg | ORAL_TABLET | Freq: Two times a day (BID) | ORAL | Status: DC
  Administered 2015-01-28 – 2015-01-30 (×5): 12.5 mg via ORAL
  Filled 2015-01-28 (×8): qty 1

## 2015-01-28 MED ORDER — SODIUM CHLORIDE 0.9 % IV SOLN
Freq: Once | INTRAVENOUS | Status: AC
  Administered 2015-01-28: 14:00:00 via INTRAVENOUS

## 2015-01-28 MED ORDER — POTASSIUM CHLORIDE CRYS ER 20 MEQ PO TBCR
40.0000 meq | EXTENDED_RELEASE_TABLET | Freq: Once | ORAL | Status: AC
  Administered 2015-01-28: 40 meq via ORAL

## 2015-01-28 MED ORDER — LISINOPRIL 5 MG PO TABS
5.0000 mg | ORAL_TABLET | Freq: Every day | ORAL | Status: DC
  Administered 2015-01-28 – 2015-01-29 (×2): 5 mg via ORAL
  Filled 2015-01-28 (×3): qty 1

## 2015-01-28 NOTE — Progress Notes (Signed)
Name: Sandra Turner MRN: [id number] DOB: 1941/02/14    ADMISSION DATE:  01/19/2015 CONSULTATION DATE:  01/26/2015  REFERRING MD :  Barry Dieneswens  CHIEF COMPLAINT:  Short of breath  BRIEF PATIENT DESCRIPTION:  74 yo female developed progressive dyspnea/hypoxia from AECOPD and pulmonary edema/pleural effusions after AVR with MAZE on 5/18.  SIGNIFICANT EVENTS  5/18 AVR, MAZE 5/20 Start amiodarone, start lasix gtt 5/21 wheezing  STUDIES:  01/17/15 PFT >> FEV1 1.06 (53%), FEV1% 63, TLC 4.11 (86%), DLCO 34%, +BD 01/24/15 Echo >> mild LVH 01/25/15 CT chest >> mod Rt effusion, small Lt effusion  SUBJECTIVE:  She feels like she has been sitting in chair too long and is getting tired.  She still has chest congestion.   VITAL SIGNS: Temp:  [97.7 F (36.5 C)-98.3 F (36.8 C)] 98.1 F (36.7 C) (05/27 0750) Pulse Rate:  [73-95] 91 (05/27 0800) Resp:  [16-37] 27 (05/27 0800) BP: (107-161)/(46-103) 132/103 mmHg (05/27 0800) SpO2:  [97 %-100 %] 99 % (05/27 0800) Weight:  [241 lb 10 oz (109.6 kg)] 241 lb 10 oz (109.6 kg) (05/27 0500)  PHYSICAL EXAMINATION: General: pleasant Neuro:  Alert, normal strength HEENT:  Pupils reactive Cardiovascular:  Irregular, 2/6 murmur Lungs:  Decreased BS Rt > Lt base, no wheeze Abdomen:  Soft, non tender Musculoskeletal:  1+ edema Skin:  No rashes   Recent Labs Lab 01/26/15 0235 01/27/15 0140 01/28/15 0511  NA 133* 134* 132*  K 4.4 4.7 3.7  CL 83* 82* 80*  CO2 37* 36* 39*  BUN 35* 45* 54*  CREATININE 1.20* 1.37* 1.39*  GLUCOSE 145* 116* 109*    Recent Labs Lab 01/26/15 0223 01/27/15 0140 01/28/15 0511  HGB 8.2* 8.6* 7.7*  HCT 26.0* 26.9* 24.3*  WBC 8.5 14.4* 16.7*  PLT 229 242 288   Dg Chest Port 1 View  01/28/2015   CLINICAL DATA:  Aortic valve replacement.  EXAM: PORTABLE CHEST - 1 VIEW  COMPARISON:  01/27/2015.  FINDINGS: Right PICC line in stable position. Prior median sternotomy and cardiac valve replacement. Left atrial appendage  clip. Cardiomegaly. Improving pulmonary venous congestion and interstitial prominence. Persistent low lung volumes with bibasilar atelectasis and pleural effusions. No pneumothorax.  IMPRESSION: 1. Right PICC line in stable position. 2. Prior cardiac valve replacement. Persistent cardiomegaly with interim improvement of pulmonary vascular prominence and interstitial edema. 3. Persistent low lung volumes with bibasilar atelectasis. Persistent small bilateral pleural effusions.   Electronically Signed   By: Maisie Fushomas  Register   On: 01/28/2015 07:49   Dg Chest Port 1 View  01/27/2015   CLINICAL DATA:  Post aortic valve replacement  EXAM: PORTABLE CHEST - 1 VIEW  COMPARISON:  Portable chest x-ray of 01/26/2015  FINDINGS: The lungs are very poorly aerated. There are basilar opacities consistent with effusions and atelectasis. There is cardiomegaly present and there may be and element of pulmonary vascular congestion as well. A right PICC line remains. The right IJ central venous line has been removed.  IMPRESSION: Little change in very poor aeration with basilar opacities consistent with effusions and atelectasis. Question mild pulmonary vascular congestion as well.   Electronically Signed   By: Dwyane DeePaul  Barry M.D.   On: 01/27/2015 08:01   Dg Chest Port 1 View  01/26/2015   CLINICAL DATA:  PICC placement.  EXAM: PORTABLE CHEST - 1 VIEW  COMPARISON:  CT chest and chest radiograph 01/25/2015.  FINDINGS: Right PICC tip projects over the SVC. Right IJ central line tip projects over the low  SVC. Heart size is grossly stable. Thoracic aorta is calcified. Lungs are low in volume with bibasilar airspace consolidation and bilateral pleural effusions.  IMPRESSION: Bibasilar airspace consolidation and bilateral pleural effusions.   Electronically Signed   By: Leanna Battles M.D.   On: 01/26/2015 13:16    ASSESSMENT / PLAN:  Acute hypoxic respiratory failure 2nd to pulmonary edema and pleural effusions, PFTs from 5/16-shows  FEV1 45% with improvement postbronchodilator - we'll treat as asthma in this nonsmoker Plan: - diurese as tolerated - change to prednisone 30 mg daily on 5/27 >> wean off as tolerated - continue pulmicort, duoneb - defer thoracentesis for now - bronchial hygiene - oxygen to keep SpO2 90 to 95% - ct mucinex, mobilize  Severe AS. Chronic A fib. S/p AVR and MAZE 5/18. Plan: - post op care, amiodarone per TCTS - continue ASA - ct heparin, coumadin per pharmacy - decrease lasix since cr rising  Summary: She has improved with additional diuresis and treatment of airway disease   PCCM can be available as needed over weekend >> please call for assistance.  Otherwise will plan to follow up on Tuesday May 31.  Coralyn Helling, MD [facility] Pulmonary/Critical Care 01/28/2015, 8:59 AM Pager:  [phone] After 3pm call: [phone]

## 2015-01-28 NOTE — Clinical Social Work Note (Signed)
CSW continuing to follow patient.  [facility] [place] once she is medically ready and discharge orders have been received.  [facility] and [facility] said if patient is ready to discharge over the weekend they will admit people.  Ervin KnackEric R. Leyah Bocchino, MSW, LCSWA [phone] 01/28/2015 11:44 AM

## 2015-01-28 NOTE — Progress Notes (Addendum)
TCTS DAILY ICU PROGRESS NOTE                   [address]            [place] [address]          [phone]   9 Days Post-Op Procedure(s) (LRB): AORTIC VALVE REPLACEMENT (AVR) (N/A) MAZE (N/A)  AORTIC ROOT ENLARGEMENT (N/A) TRANSESOPHAGEAL ECHOCARDIOGRAM (TEE) (N/A)  Total Length of Stay:  LOS: 9 days   Subjective: OOB in chair.  No new complaints. Not walking much.  Appetite fair.    Objective: Vital signs in last 24 hours: Temp:  [97.7 F (36.5 C)-98.3 F (36.8 C)] 98.1 F (36.7 C) (05/27 0750) Pulse Rate:  [73-95] 91 (05/27 0800) Cardiac Rhythm:  [-] Atrial fibrillation (05/27 0800) Resp:  [16-37] 27 (05/27 0800) BP: (107-161)/(46-103) 132/103 mmHg (05/27 0800) SpO2:  [97 %-100 %] 99 % (05/27 0800) Weight:  [241 lb 10 oz (109.6 kg)] 241 lb 10 oz (109.6 kg) (05/27 0500)  Filed Weights   01/26/15 0400 01/27/15 0500 01/28/15 0500  Weight: 244 lb 14.9 oz (111.1 kg) 244 lb 11.4 oz (111 kg) 241 lb 10 oz (109.6 kg)  BASELINE WEIGHT:112 kg  Weight change: -3 lb 1.4 oz (-1.4 kg)   CBGs [phone]      Intake/Output from previous day: 05/26 0701 - 05/27 0700 In: 729.3 [P.O.:540; I.V.:189.3] Out: 1620 [Urine:1620]  Intake/Output this shift: Total I/O In: 120 [I.V.:120] Out: 150 [Urine:150]  Current Meds: Scheduled Meds: . amiodarone  400 mg Oral Q12H   Followed by  . [START ON 01/29/2015] amiodarone  400 mg Oral Daily  . antiseptic oral rinse  7 mL Mouth Rinse BID  . aspirin EC  81 mg Oral Daily  . bisacodyl  10 mg Oral Daily   Or  . bisacodyl  10 mg Rectal Daily  . budesonide (PULMICORT) nebulizer solution  0.5 mg Nebulization BID  . docusate sodium  200 mg Oral Daily  . furosemide  80 mg Intravenous BID  . guaiFENesin  1,200 mg Oral BID  . insulin aspart  0-24 Units Subcutaneous TID AC & HS  . ipratropium-albuterol  3 mL Nebulization Q6H  . metolazone  10 mg Oral Daily  . pantoprazole  40 mg Oral Daily  . [START ON 01/29/2015]  predniSONE  30 mg Oral Q breakfast  . sodium chloride  10-40 mL Intracatheter Q12H  . sodium chloride  3 mL Intravenous Q12H  . warfarin  5 mg Oral q1800  . Warfarin - Physician Dosing Inpatient   Does not apply q1800   Continuous Infusions: . sodium chloride Stopped (01/22/15 0000)  . heparin 1,200 Units/hr (01/28/15 0800)   PRN Meds:.albuterol, metoprolol, ondansetron (ZOFRAN) IV, oxyCODONE, sodium chloride, sodium chloride, traMADol   Physical Exam: General appearance: alert and no distress Heart: irregularly irregular rhythm Lungs: Rare wheezes bilaterally Extremities: +LE edema Wound: Clean and dry  Lab Results: CBC: Recent Labs  01/27/15 0140 01/28/15 0511  WBC 14.4* 16.7*  HGB 8.6* 7.7*  HCT 26.9* 24.3*  PLT 242 288   BMET:  Recent Labs  01/27/15 0140 01/28/15 0511  NA 134* 132*  K 4.7 3.7  CL 82* 80*  CO2 36* 39*  GLUCOSE 116* 109*  BUN 45* 54*  CREATININE 1.37* 1.39*  CALCIUM 9.2 8.9    PT/INR:  Recent Labs  01/28/15 0511  LABPROT 18.3*  INR 1.51*   Radiology: Dg Chest Port 1 View  01/28/2015  CLINICAL DATA:  Aortic valve replacement.  EXAM: PORTABLE CHEST - 1 VIEW  COMPARISON:  01/27/2015.  FINDINGS: Right PICC line in stable position. Prior median sternotomy and cardiac valve replacement. Left atrial appendage clip. Cardiomegaly. Improving pulmonary venous congestion and interstitial prominence. Persistent low lung volumes with bibasilar atelectasis and pleural effusions. No pneumothorax.  IMPRESSION: 1. Right PICC line in stable position. 2. Prior cardiac valve replacement. Persistent cardiomegaly with interim improvement of pulmonary vascular prominence and interstitial edema. 3. Persistent low lung volumes with bibasilar atelectasis. Persistent small bilateral pleural effusions.   Electronically Signed   By: Maisie Fushomas  Register   On: 01/28/2015 07:49     Assessment/Plan: S/P Procedure(s) (LRB): AORTIC VALVE REPLACEMENT (AVR) (N/A) MAZE (N/A)   AORTIC ROOT ENLARGEMENT (N/A) TRANSESOPHAGEAL ECHOCARDIOGRAM (TEE) (N/A)  CV- rate controlled AF on Amiodarone, acute/chronic diastolic CHF.  Continue current care. BPs trending up, may need to add anti-hypertensive agent.  Pulm- acute hypoxic resp failure, improving. CCM following, tapering steroids. No thoracentesis for now per CCM.  Vol overload- continue diuresis.  Renal- Cr stable. Continue to watch.  Expected postop blood loss anemia- H/H down further today.  Continue to watch, may need transfusion.  Endocrine- CBGs stable, A1C=5.3. Continue SSI.  Leukocytosis, no fever. Possibly atelectasis vs steroid effect. Continue to monitor. May need to check sputum cx, etc if WBC continues to rise.  Deconditioning- Pt refusing to walk much, continue PT as tolerated.  Disp- will need SNF at d/c- CSW already working on placement.   COLLINS,GINA H 01/28/2015 9:15 AM  I have seen and examined the patient and agree with the assessment and plan as outlined.  HR and BP increased.  Will restart low dose beta blocker and try low dose ACE-I.  Will transfuse 2 units PRBC's for symptomatic anemia.  Overall making slow but steady progress.  INR finally starting to trend up.  Will d/c heparin once INR > 1.8.  WBC up further - I am concerned that she could be developing HCAP - I would think WBC should be down today if leukocytosis was related only to steroids.  Will add empiric ABx.  Mobilize as much as possible - she needs a tremendous amount of encouragement.  Transfer step down tomorrow if she's improved.     Purcell NailsClarence H Owen 01/28/2015 12:54 PM

## 2015-01-28 NOTE — Progress Notes (Signed)
Patient examined and record reviewed.Hemodynamics stable,labs satisfactory.Patient had stable day.Continue current care.  Patient currently sitting up in chair-receiving packed red cells for postoperative anemia and weakness Urine output adequate IV Lasix Continue IV heparin until INR close to therapeutic Sandra Turner III 01/28/2015

## 2015-01-28 NOTE — Progress Notes (Signed)
ANTICOAGULATION CONSULT NOTE - Follow Up Consult  Pharmacy Consult for heparin Indication: atrial fibrillation  Allergies  Allergen Reactions  . Bee Venom Swelling  . Latex Rash  . Penicillins Rash    Patient Measurements: Height: 5\' 2"  (157.5 cm) Weight: 241 lb 10 oz (109.6 kg) IBW/kg (Calculated) : 50.1 Heparin Dosing Weight: 69kg  Vital Signs: Temp: 98.1 F (36.7 C) (05/27 0750) Temp Source: Oral (05/27 0750) BP: 132/103 mmHg (05/27 0800) Pulse Rate: 91 (05/27 0800)  Labs:  Recent Labs  01/26/15 0223  01/26/15 0235 01/27/15 0140 01/27/15 0142 01/27/15 1046 01/28/15 0511  HGB 8.2*  --   --  8.6*  --   --  7.7*  HCT 26.0*  --   --  26.9*  --   --  24.3*  PLT 229  --   --  242  --   --  288  LABPROT  --   --  16.5* 17.1*  --   --  18.3*  INR  --   --  1.32 1.39  --   --  1.51*  HEPARINUNFRC  --   < > 0.22*  --  0.33 0.29* 0.59  CREATININE  --   --  1.20* 1.37*  --   --  1.39*  < > = values in this interval not displayed.  Estimated Creatinine Clearance: 42.7 mL/min (by C-G formula based on Cr of 1.39).   Medications:  Scheduled:  . sodium chloride Stopped (01/22/15 0000)  . heparin 1,200 Units/hr (01/28/15 0800)    Assessment: 74 yo female with s/p AVR with persistent afib. She was started on heparin 5/24 and this was stopped thisfor removal of pacing wires on 5/25 and was resumed.   Heparin level is therapeutic with current heparin infusion. MD is dosing coumadin.  No bleeding or complications noted per chart notes, but Hgb down this AM.  Goal of Therapy:  Heparin level 0.3-0.7 units/ml Monitor platelets by anticoagulation protocol: Yes  Plan -Continue IV heparin at current rate. -Heparin level and CBC in am -Monitor for bleeding.  Tad MooreJessica Cambrea Kirt, Pharm D, BCPS  Clinical Pharmacist Pager [phone]  01/28/2015 8:30 AM

## 2015-01-29 LAB — BASIC METABOLIC PANEL
ANION GAP: 14 (ref 5–15)
BUN: 63 mg/dL — AB (ref 6–20)
CALCIUM: 8.9 mg/dL (ref 8.9–10.3)
CO2: 39 mmol/L — ABNORMAL HIGH (ref 22–32)
Chloride: 77 mmol/L — ABNORMAL LOW (ref 101–111)
Creatinine, Ser: 2.05 mg/dL — ABNORMAL HIGH (ref 0.44–1.00)
GFR, EST AFRICAN AMERICAN: 27 mL/min — AB (ref >60)
GFR, EST NON AFRICAN AMERICAN: 23 mL/min — AB (ref >60)
Glucose, Bld: 129 mg/dL — ABNORMAL HIGH (ref 65–99)
POTASSIUM: 4.1 mmol/L (ref 3.5–5.1)
Sodium: 130 mmol/L — ABNORMAL LOW (ref 135–145)

## 2015-01-29 LAB — CBC
HCT: 28.5 % — ABNORMAL LOW (ref 36.0–46.0)
Hemoglobin: 9.2 g/dL — ABNORMAL LOW (ref 12.0–15.0)
MCH: 29.3 pg (ref 26.0–34.0)
MCHC: 32.3 g/dL (ref 30.0–36.0)
MCV: 90.8 fL (ref 78.0–100.0)
PLATELETS: 305 10*3/uL (ref 150–400)
RBC: 3.14 MIL/uL — ABNORMAL LOW (ref 3.87–5.11)
RDW: 15.2 % (ref 11.5–15.5)
WBC: 24.1 10*3/uL — AB (ref 4.0–10.5)

## 2015-01-29 LAB — TYPE AND SCREEN
ABO/RH(D): A NEG
ANTIBODY SCREEN: NEGATIVE
Unit division: 0
Unit division: 0

## 2015-01-29 LAB — GLUCOSE, CAPILLARY
GLUCOSE-CAPILLARY: 117 mg/dL — AB (ref 65–99)
GLUCOSE-CAPILLARY: 127 mg/dL — AB (ref 65–99)
Glucose-Capillary: 183 mg/dL — ABNORMAL HIGH (ref 65–99)
Glucose-Capillary: 149 mg/dL — ABNORMAL HIGH (ref 65–99)
Glucose-Capillary: 129 mg/dL — ABNORMAL HIGH (ref 65–99)

## 2015-01-29 LAB — PROTIME-INR
INR: 1.68 — AB (ref 0.00–1.49)
PROTHROMBIN TIME: 19.8 s — AB (ref 11.6–15.2)

## 2015-01-29 LAB — HEPARIN LEVEL (UNFRACTIONATED): Heparin Unfractionated: 0.48 IU/mL (ref 0.30–0.70)

## 2015-01-29 MED ORDER — IPRATROPIUM-ALBUTEROL 0.5-2.5 (3) MG/3ML IN SOLN
RESPIRATORY_TRACT | Status: AC
  Administered 2015-01-29: 10:00:00
  Filled 2015-01-29: qty 3

## 2015-01-29 NOTE — Progress Notes (Signed)
ANTICOAGULATION CONSULT NOTE - Follow Up Consult  Pharmacy Consult for heparin Indication: atrial fibrillation  Allergies  Allergen Reactions  . Bee Venom Swelling  . Latex Rash  . Penicillins Rash    Patient Measurements: Height: 5\' 2"  (157.5 cm) Weight: 23 lb 9.4 oz (10.7 kg) IBW/kg (Calculated) : 50.1 Heparin Dosing Weight: 69kg  Vital Signs: Temp: 97.9 F (36.6 C) (05/28 0700) Temp Source: Oral (05/28 0700) BP: 127/98 mmHg (05/28 1032) Pulse Rate: 81 (05/28 1032)  Labs:  Recent Labs  01/27/15 0140  01/27/15 1046 01/28/15 0511 01/29/15 0253 01/29/15 0500  HGB 8.6*  --   --  7.7*  --  9.2*  HCT 26.9*  --   --  24.3*  --  28.5*  PLT 242  --   --  288  --  305  LABPROT 17.1*  --   --  18.3* 19.8*  --   INR 1.39  --   --  1.51* 1.68*  --   HEPARINUNFRC  --   < > 0.29* 0.59 0.48  --   CREATININE 1.37*  --   --  1.39*  --  2.05*  < > = values in this interval not displayed.  Estimated Creatinine Clearance: 4.2 mL/min (by C-G formula based on Cr of 2.05).   Medications:  Scheduled:  . sodium chloride 250 mL (01/28/15 1719)  . heparin 1,200 Units/hr (01/29/15 0400)    Assessment: 74 yo female with s/p AVR with persistent afib. She was started on heparin 5/24 and this was stopped this for removal of pacing wires on 5/25 and was resumed.   Heparin level 0.48 is therapeutic with current heparin infusion 1200 uts/hr. MD is dosing coumadin 5mg  daily IBR 1.7.  No bleeding or complications noted, c/c low stable, PLTC ok  Goal of Therapy:  Heparin level 0.3-0.7 units/ml Monitor platelets by anticoagulation protocol: Yes  Plan -Continue IV heparin at current rate. -Heparin level and CBC in am -Monitor for bleeding.  Leota SauersLisa Damean Poffenberger Pharm.D. CPP, BCPS Clinical Pharmacist [phone] 01/29/2015 11:57 AM   Spoke to Dr Donata ClayVan Trigt - INR close to goal will stop heparin drip

## 2015-01-29 NOTE — Progress Notes (Signed)
Patient examined and record reviewed.Hemodynamics stable,labs satisfactory.Patient had stable day.Continue current care. Darleth Eustache Van Trigt III 01/29/2015   

## 2015-01-29 NOTE — Progress Notes (Signed)
10 Days Post-Op Procedure(s) (LRB): AORTIC VALVE REPLACEMENT (AVR) (N/A) MAZE (N/A)  AORTIC ROOT ENLARGEMENT (N/A) TRANSESOPHAGEAL ECHOCARDIOGRAM (TEE) (N/A) Subjective: DOE Controlled afib- INR 1.8 so will stop iv heparin Rising creat > 2.0 - stop lasix, lisinopril  Objective: Vital signs in last 24 hours: Temp:  [97.7 F (36.5 C)-98.8 F (37.1 C)] 97.9 F (36.6 C) (05/28 0700) Pulse Rate:  [44-94] 81 (05/28 1032) Cardiac Rhythm:  [-] Atrial fibrillation (05/27 2000) Resp:  [14-28] 19 (05/28 0700) BP: (71-183)/(47-110) 127/98 mmHg (05/28 1032) SpO2:  [91 %-100 %] 94 % (05/28 0929) Weight:  [23 lb 9.4 oz (10.7 kg)] 23 lb 9.4 oz (10.7 kg) (05/28 0211)  Hemodynamic parameters for last 24 hours:  stable  Intake/Output from previous day: 05/27 0701 - 05/28 0700 In: 1656 [P.O.:600; I.V.:438; Blood:618] Out: 1151 [Urine:1150; Stool:1] Intake/Output this shift:    Neuro intact Distant breath sounds  Lab Results:  Recent Labs  01/28/15 0511 01/29/15 0500  WBC 16.7* 24.1*  HGB 7.7* 9.2*  HCT 24.3* 28.5*  PLT 288 305   BMET:  Recent Labs  01/28/15 0511 01/29/15 0500  NA 132* 130*  K 3.7 4.1  CL 80* 77*  CO2 39* 39*  GLUCOSE 109* 129*  BUN 54* 63*  CREATININE 1.39* 2.05*  CALCIUM 8.9 8.9    PT/INR:  Recent Labs  01/29/15 0253  LABPROT 19.8*  INR 1.68*   ABG    Component Value Date/Time   PHART TEST WILL BE CREDITED 01/20/2015 0930   HCO3 TEST WILL BE CREDITED 01/20/2015 0930   TCO2 21 01/20/2015 1416   ACIDBASEDEF TEST WILL BE CREDITED 01/20/2015 0930   O2SAT 63.3 01/26/2015 0500   CBG (last 3)   Recent Labs  01/28/15 1605 01/28/15 2109 01/29/15 0822  GLUCAP 198* 121* 117*    Assessment/Plan: S/P Procedure(s) (LRB): AORTIC VALVE REPLACEMENT (AVR) (N/A) MAZE (N/A)  AORTIC ROOT ENLARGEMENT (N/A) TRANSESOPHAGEAL ECHOCARDIOGRAM (TEE) (N/A)  Recheck creat this pm- if still rising will start renal dopamine Keep in ICU  LOS: 10 days     Kathlee Nationseter Van Trigt III 01/29/2015

## 2015-01-30 LAB — CBC
HCT: 27.6 % — ABNORMAL LOW (ref 36.0–46.0)
Hemoglobin: 9.1 g/dL — ABNORMAL LOW (ref 12.0–15.0)
MCH: 30 pg (ref 26.0–34.0)
MCHC: 33 g/dL (ref 30.0–36.0)
MCV: 91.1 fL (ref 78.0–100.0)
Platelets: 243 10*3/uL (ref 150–400)
RBC: 3.03 MIL/uL — ABNORMAL LOW (ref 3.87–5.11)
RDW: 15 % (ref 11.5–15.5)
WBC: 28.2 10*3/uL — ABNORMAL HIGH (ref 4.0–10.5)

## 2015-01-30 LAB — BASIC METABOLIC PANEL
Anion gap: 12 (ref 5–15)
Anion gap: 12 (ref 5–15)
BUN: 80 mg/dL — ABNORMAL HIGH (ref 6–20)
BUN: 84 mg/dL — ABNORMAL HIGH (ref 6–20)
CO2: 39 mmol/L — ABNORMAL HIGH (ref 22–32)
CO2: 35 mmol/L — ABNORMAL HIGH (ref 22–32)
Calcium: 9 mg/dL (ref 8.9–10.3)
Calcium: 8.4 mg/dL — ABNORMAL LOW (ref 8.9–10.3)
Chloride: 77 mmol/L — ABNORMAL LOW (ref 101–111)
Chloride: 82 mmol/L — ABNORMAL LOW (ref 101–111)
Creatinine, Ser: 2.87 mg/dL — ABNORMAL HIGH (ref 0.44–1.00)
Creatinine, Ser: 3.1 mg/dL — ABNORMAL HIGH (ref 0.44–1.00)
GFR calc Af Amer: 18 mL/min — ABNORMAL LOW (ref >60)
GFR calc Af Amer: 16 mL/min — ABNORMAL LOW (ref >60)
GFR calc non Af Amer: 15 mL/min — ABNORMAL LOW (ref >60)
GFR calc non Af Amer: 14 mL/min — ABNORMAL LOW (ref >60)
Glucose, Bld: 103 mg/dL — ABNORMAL HIGH (ref 65–99)
Glucose, Bld: 147 mg/dL — ABNORMAL HIGH (ref 65–99)
Potassium: 4.2 mmol/L (ref 3.5–5.1)
Potassium: 4.3 mmol/L (ref 3.5–5.1)
Sodium: 128 mmol/L — ABNORMAL LOW (ref 135–145)
Sodium: 129 mmol/L — ABNORMAL LOW (ref 135–145)

## 2015-01-30 LAB — PROTIME-INR
INR: 2.12 — ABNORMAL HIGH (ref 0.00–1.49)
PROTHROMBIN TIME: 23.5 s — AB (ref 11.6–15.2)

## 2015-01-30 LAB — GLUCOSE, CAPILLARY
GLUCOSE-CAPILLARY: 126 mg/dL — AB (ref 65–99)
Glucose-Capillary: 73 mg/dL (ref 65–99)
Glucose-Capillary: 127 mg/dL — ABNORMAL HIGH (ref 65–99)

## 2015-01-30 MED ORDER — SODIUM CHLORIDE 0.9 % IV SOLN
INTRAVENOUS | Status: DC
  Administered 2015-01-30 – 2015-02-15 (×4): via INTRAVENOUS

## 2015-01-30 MED ORDER — DOPAMINE-DEXTROSE 3.2-5 MG/ML-% IV SOLN
0.0000 ug/kg/min | INTRAVENOUS | Status: DC
Start: 2015-01-30 — End: 2015-02-03
  Administered 2015-01-30: 2.5 ug/kg/min via INTRAVENOUS
  Administered 2015-01-31: 3 ug/kg/min via INTRAVENOUS
  Filled 2015-01-30 (×2): qty 250

## 2015-01-30 MED ORDER — ACETAMINOPHEN 325 MG PO TABS: 650.0000 mg | ORAL_TABLET | Freq: Four times a day (QID) | ORAL | Status: DC | PRN

## 2015-01-30 NOTE — Progress Notes (Signed)
11 Days Post-Op Procedure(s) (LRB): AORTIC VALVE REPLACEMENT (AVR) (N/A) MAZE (N/A)  AORTIC ROOT ENLARGEMENT (N/A) TRANSESOPHAGEAL ECHOCARDIOGRAM (TEE) (N/A) Subjective: Gen. improvement after tissue aVR in pulmonary status and overall strength Able to walk twice in the hallway yesterday However now BUN creatinine have significant increased Creatinine now 2.9 --Lasix and metolazone and lisinopril have been discontinued. Gentlel IV rehydration started  Objective: Vital signs in last 24 hours: Temp:  [97.5 F (36.4 C)-99.7 F (37.6 C)] 99.7 F (37.6 C) (05/29 0900) Pulse Rate:  [25-99] 83 (05/29 1100) Cardiac Rhythm:  [-] Normal sinus rhythm (05/29 0800) Resp:  [14-29] 24 (05/29 0900) BP: (84-133)/(40-116) 96/50 mmHg (05/29 1100) SpO2:  [87 %-100 %] 91 % (05/29 1100) Weight:  [238 lb 12.1 oz (108.3 kg)] 238 lb 12.1 oz (108.3 kg) (05/29 0230)  Hemodynamic parameters for last 24 hours:   sinus stable  Intake/Output from previous day: 05/28 0701 - 05/29 0700 In: 676 [P.O.:600; I.V.:76] Out: 405 [Urine:405] Intake/Output this shift: Total I/O In: 179.4 [P.O.:120; I.V.:59.4] Out: -   Sternal incision clean and dry Weight down below preop level Breath sounds clear improved  Lab Results:  Recent Labs  01/29/15 0500 01/30/15 0419  WBC 24.1* 28.2*  HGB 9.2* 9.1*  HCT 28.5* 27.6*  PLT 305 243   BMET:  Recent Labs  01/29/15 0500 01/30/15 0535  NA 130* 128*  K 4.1 4.2  CL 77* 77*  CO2 39* 39*  GLUCOSE 129* 103*  BUN 63* 80*  CREATININE 2.05* 2.87*  CALCIUM 8.9 9.0    PT/INR:  Recent Labs  01/30/15 0419  LABPROT 23.5*  INR 2.12*   ABG    Component Value Date/Time   PHART TEST WILL BE CREDITED 01/20/2015 0930   HCO3 TEST WILL BE CREDITED 01/20/2015 0930   TCO2 21 01/20/2015 1416   ACIDBASEDEF TEST WILL BE CREDITED 01/20/2015 0930   O2SAT 63.3 01/26/2015 0500   CBG (last 3)   Recent Labs  01/29/15 1557 01/29/15 2118 01/30/15 0855  GLUCAP 149*  129* 73    Assessment/Plan S/P Procedure(s) (LRB): AORTIC VALVE REPLACEMENT (AVR) (N/A) MAZE (N/A)  AORTIC ROOT ENLARGEMENT (N/A) TRANSESOPHAGEAL ECHOCARDIOGRAM (TEE) (N/A) Acute postoperative renal insufficiency probably related to aggressive diuresis for fluid retention   We'll start renal dose dopamine. Stop Lasix and ACE inhibitor and observe. Keep in ICU.   LOS: 11 days    Kathlee Nationseter Van Trigt III 01/30/2015

## 2015-01-30 NOTE — Progress Notes (Signed)
CT surgery p.m. Rounds  Patient on renal dose dopamine-urine output remains low back adequate She walked in the hallway once today No new problems-follow-up creatinine in a.m.

## 2015-01-31 ENCOUNTER — Other Ambulatory Visit: Payer: Self-pay

## 2015-01-31 ENCOUNTER — Inpatient Hospital Stay (HOSPITAL_COMMUNITY): Payer: Medicare Other

## 2015-01-31 DIAGNOSIS — J42 Unspecified chronic bronchitis: Secondary | ICD-10-CM

## 2015-01-31 DIAGNOSIS — J9809 Other diseases of bronchus, not elsewhere classified: Secondary | ICD-10-CM | POA: Diagnosis not present

## 2015-01-31 DIAGNOSIS — J9811 Atelectasis: Secondary | ICD-10-CM

## 2015-01-31 DIAGNOSIS — T17500A Unspecified foreign body in bronchus causing asphyxiation, initial encounter: Secondary | ICD-10-CM | POA: Diagnosis not present

## 2015-01-31 DIAGNOSIS — I509 Heart failure, unspecified: Secondary | ICD-10-CM

## 2015-01-31 DIAGNOSIS — J45901 Unspecified asthma with (acute) exacerbation: Secondary | ICD-10-CM | POA: Diagnosis not present

## 2015-01-31 LAB — BASIC METABOLIC PANEL
Anion gap: 15 (ref 5–15)
Anion gap: 9 (ref 5–15)
BUN: 92 mg/dL — ABNORMAL HIGH (ref 6–20)
BUN: 97 mg/dL — ABNORMAL HIGH (ref 6–20)
CO2: 36 mmol/L — ABNORMAL HIGH (ref 22–32)
CO2: 34 mmol/L — ABNORMAL HIGH (ref 22–32)
Calcium: 8.5 mg/dL — ABNORMAL LOW (ref 8.9–10.3)
Calcium: 7.9 mg/dL — ABNORMAL LOW (ref 8.9–10.3)
Chloride: 78 mmol/L — ABNORMAL LOW (ref 101–111)
Chloride: 84 mmol/L — ABNORMAL LOW (ref 101–111)
Creatinine, Ser: 3.15 mg/dL — ABNORMAL HIGH (ref 0.44–1.00)
Creatinine, Ser: 2.9 mg/dL — ABNORMAL HIGH (ref 0.44–1.00)
GFR calc Af Amer: 16 mL/min — ABNORMAL LOW (ref >60)
GFR calc Af Amer: 18 mL/min — ABNORMAL LOW (ref >60)
GFR calc non Af Amer: 14 mL/min — ABNORMAL LOW (ref >60)
GFR calc non Af Amer: 15 mL/min — ABNORMAL LOW (ref >60)
Glucose, Bld: 98 mg/dL (ref 65–99)
Glucose, Bld: 146 mg/dL — ABNORMAL HIGH (ref 65–99)
Potassium: 4.6 mmol/L (ref 3.5–5.1)
Potassium: 4.4 mmol/L (ref 3.5–5.1)
Sodium: 129 mmol/L — ABNORMAL LOW (ref 135–145)
Sodium: 127 mmol/L — ABNORMAL LOW (ref 135–145)

## 2015-01-31 LAB — CBC
HEMATOCRIT: 27.4 % — AB (ref 36.0–46.0)
Hemoglobin: 8.9 g/dL — ABNORMAL LOW (ref 12.0–15.0)
MCH: 29.7 pg (ref 26.0–34.0)
MCHC: 32.5 g/dL (ref 30.0–36.0)
MCV: 91.3 fL (ref 78.0–100.0)
Platelets: 231 10*3/uL (ref 150–400)
RBC: 3 MIL/uL — ABNORMAL LOW (ref 3.87–5.11)
RDW: 15 % (ref 11.5–15.5)
WBC: 23.3 10*3/uL — AB (ref 4.0–10.5)

## 2015-01-31 LAB — POCT I-STAT 3, VENOUS BLOOD GAS (G3P V)
Acid-Base Excess: 9 mmol/L — ABNORMAL HIGH (ref 0.0–2.0)
Bicarbonate: 36.8 mEq/L — ABNORMAL HIGH (ref 20.0–24.0)
O2 Saturation: 99 %
PH VEN: 7.348 — AB (ref 7.250–7.300)
PO2 VEN: 180 mmHg — AB (ref 30.0–45.0)
Patient temperature: 98.4
TCO2: 39 mmol/L (ref 0–100)
pCO2, Ven: 66.9 mmHg — ABNORMAL HIGH (ref 45.0–50.0)

## 2015-01-31 LAB — PROTIME-INR
INR: 2.38 — AB (ref 0.00–1.49)
PROTHROMBIN TIME: 25.7 s — AB (ref 11.6–15.2)

## 2015-01-31 LAB — GLUCOSE, CAPILLARY
GLUCOSE-CAPILLARY: 121 mg/dL — AB (ref 65–99)
Glucose-Capillary: 101 mg/dL — ABNORMAL HIGH (ref 65–99)
Glucose-Capillary: 78 mg/dL (ref 65–99)
Glucose-Capillary: 119 mg/dL — ABNORMAL HIGH (ref 65–99)
Glucose-Capillary: 121 mg/dL — ABNORMAL HIGH (ref 65–99)

## 2015-01-31 LAB — CARBOXYHEMOGLOBIN
Carboxyhemoglobin: 1.7 % — ABNORMAL HIGH (ref 0.5–1.5)
Methemoglobin: 1.3 % (ref 0.0–1.5)
O2 Saturation: 79.4 %
Total hemoglobin: 8.6 g/dL — ABNORMAL LOW (ref 12.0–16.0)

## 2015-01-31 MED ORDER — ETOMIDATE 2 MG/ML IV SOLN
20.0000 mg | Freq: Once | INTRAVENOUS | Status: AC
  Administered 2015-01-31: 20 mg via INTRAVENOUS

## 2015-01-31 MED ORDER — ACETYLCYSTEINE 20 % IN SOLN
3.0000 mL | Freq: Three times a day (TID) | RESPIRATORY_TRACT | Status: DC
  Administered 2015-01-31 – 2015-02-01 (×5): 3 mL via RESPIRATORY_TRACT
  Administered 2015-02-02: 08:00:00 via RESPIRATORY_TRACT
  Administered 2015-02-02 – 2015-02-04 (×6): 3 mL via RESPIRATORY_TRACT
  Filled 2015-01-31 (×14): qty 4

## 2015-01-31 MED ORDER — AMIODARONE HCL IN DEXTROSE 360-4.14 MG/200ML-% IV SOLN
30.0000 mg/h | INTRAVENOUS | Status: AC
  Administered 2015-01-31 – 2015-02-03 (×6): 30 mg/h via INTRAVENOUS
  Filled 2015-01-31 (×13): qty 200

## 2015-01-31 MED ORDER — AMIODARONE HCL 200 MG PO TABS: 400.0000 mg | ORAL_TABLET | Freq: Every day | ORAL | Status: DC

## 2015-01-31 MED ORDER — METHYLPREDNISOLONE SODIUM SUCC 125 MG IJ SOLR
60.0000 mg | Freq: Four times a day (QID) | INTRAMUSCULAR | Status: DC
  Administered 2015-01-31 – 2015-02-01 (×4): 60 mg via INTRAVENOUS
  Filled 2015-01-31: qty 2
  Filled 2015-01-31: qty 0.96
  Filled 2015-01-31: qty 2
  Filled 2015-01-31: qty 0.96
  Filled 2015-01-31: qty 2
  Filled 2015-01-31 (×2): qty 0.96
  Filled 2015-01-31: qty 2

## 2015-01-31 MED ORDER — MIDAZOLAM HCL 2 MG/2ML IJ SOLN
1.0000 mg | INTRAMUSCULAR | Status: DC | PRN
  Administered 2015-02-02 – 2015-02-04 (×3): 1 mg via INTRAVENOUS
  Filled 2015-01-31 (×3): qty 2

## 2015-01-31 MED ORDER — FENTANYL CITRATE (PF) 100 MCG/2ML IJ SOLN: 50.0000 ug | Freq: Once | INTRAMUSCULAR | Status: DC

## 2015-01-31 MED ORDER — MIDAZOLAM HCL 2 MG/2ML IJ SOLN
1.0000 mg | INTRAMUSCULAR | Status: DC | PRN
  Administered 2015-01-31 (×2): 1 mg via INTRAVENOUS
  Filled 2015-01-31 (×5): qty 2

## 2015-01-31 MED ORDER — ALBUTEROL SULFATE (2.5 MG/3ML) 0.083% IN NEBU
2.5000 mg | INHALATION_SOLUTION | RESPIRATORY_TRACT | Status: DC
  Administered 2015-01-31 – 2015-02-07 (×43): 2.5 mg via RESPIRATORY_TRACT
  Filled 2015-01-31 (×43): qty 3

## 2015-01-31 MED ORDER — ROCURONIUM BROMIDE 50 MG/5ML IV SOLN
80.0000 mg | Freq: Once | INTRAVENOUS | Status: AC
  Administered 2015-01-31: 80 mg via INTRAVENOUS

## 2015-01-31 MED ORDER — FENTANYL CITRATE (PF) 100 MCG/2ML IJ SOLN
50.0000 ug | INTRAMUSCULAR | Status: DC | PRN
  Administered 2015-01-31 (×3): 50 ug via INTRAVENOUS
  Filled 2015-01-31 (×2): qty 2

## 2015-01-31 MED ORDER — MIDAZOLAM HCL 2 MG/2ML IJ SOLN
2.0000 mg | Freq: Once | INTRAMUSCULAR | Status: AC
  Administered 2015-01-31: 2 mg via INTRAVENOUS

## 2015-01-31 MED ORDER — SODIUM CHLORIDE 0.9 % IV SOLN: INTRAVENOUS | Status: DC | PRN

## 2015-01-31 MED ORDER — OSMOLITE 1.5 CAL PO LIQD
1000.0000 mL | ORAL | Status: DC
  Administered 2015-02-01 – 2015-02-04 (×3): 1000 mL
  Filled 2015-01-31 (×8): qty 1000

## 2015-01-31 MED ORDER — SODIUM CHLORIDE 0.9 % IV SOLN
25.0000 ug/h | INTRAVENOUS | Status: DC
  Administered 2015-01-31 – 2015-02-04 (×5): 50 ug/h via INTRAVENOUS
  Administered 2015-02-06: 25 ug/h via INTRAVENOUS
  Filled 2015-01-31 (×6): qty 50

## 2015-01-31 MED ORDER — WARFARIN SODIUM 3 MG PO TABS
3.0000 mg | ORAL_TABLET | Freq: Every day | ORAL | Status: DC
  Administered 2015-01-31: 3 mg via ORAL
  Filled 2015-01-31 (×2): qty 1

## 2015-01-31 MED ORDER — FENTANYL BOLUS VIA INFUSION
25.0000 ug | INTRAVENOUS | Status: DC | PRN
  Filled 2015-01-31: qty 25

## 2015-01-31 MED ORDER — DEXTROSE 5 % IV SOLN
1.0000 g | INTRAVENOUS | Status: AC
  Administered 2015-01-31 – 2015-02-06 (×7): 1 g via INTRAVENOUS
  Filled 2015-01-31 (×8): qty 1

## 2015-01-31 MED ORDER — ALBUMIN HUMAN 5 % IV SOLN
INTRAVENOUS | Status: AC
  Filled 2015-01-31: qty 500

## 2015-01-31 MED ORDER — CHLORHEXIDINE GLUCONATE 0.12 % MT SOLN
15.0000 mL | Freq: Two times a day (BID) | OROMUCOSAL | Status: DC
  Administered 2015-01-31 – 2015-02-07 (×14): 15 mL via OROMUCOSAL
  Filled 2015-01-31 (×14): qty 15

## 2015-01-31 MED ORDER — FENTANYL CITRATE (PF) 100 MCG/2ML IJ SOLN
50.0000 ug | INTRAMUSCULAR | Status: DC | PRN
  Administered 2015-01-31 (×2): 50 ug via INTRAVENOUS
  Filled 2015-01-31: qty 2

## 2015-01-31 MED ORDER — MIDAZOLAM HCL 2 MG/2ML IJ SOLN
INTRAMUSCULAR | Status: AC
  Filled 2015-01-31: qty 2

## 2015-01-31 MED ORDER — FENTANYL CITRATE (PF) 100 MCG/2ML IJ SOLN
INTRAMUSCULAR | Status: AC
  Filled 2015-01-31: qty 2

## 2015-01-31 MED ORDER — ALBUMIN HUMAN 5 % IV SOLN
25.0000 g | Freq: Once | INTRAVENOUS | Status: AC
  Administered 2015-01-31: 25 g via INTRAVENOUS
  Filled 2015-01-31: qty 500

## 2015-01-31 MED ORDER — CETYLPYRIDINIUM CHLORIDE 0.05 % MT LIQD
7.0000 mL | Freq: Four times a day (QID) | OROMUCOSAL | Status: DC
  Administered 2015-01-31 – 2015-02-07 (×26): 7 mL via OROMUCOSAL

## 2015-01-31 MED ORDER — INSULIN ASPART 100 UNIT/ML ~~LOC~~ SOLN
0.0000 [IU] | SUBCUTANEOUS | Status: DC
  Administered 2015-01-31 – 2015-02-03 (×8): 2 [IU] via SUBCUTANEOUS
  Administered 2015-02-04: 4 [IU] via SUBCUTANEOUS
  Administered 2015-02-04 – 2015-02-06 (×8): 2 [IU] via SUBCUTANEOUS
  Administered 2015-02-06: 4 [IU] via SUBCUTANEOUS
  Administered 2015-02-06 (×2): 2 [IU] via SUBCUTANEOUS
  Administered 2015-02-07: 4 [IU] via SUBCUTANEOUS
  Administered 2015-02-08 – 2015-02-09 (×3): 2 [IU] via SUBCUTANEOUS
  Administered 2015-02-09: 4 [IU] via SUBCUTANEOUS
  Administered 2015-02-09 – 2015-02-12 (×14): 2 [IU] via SUBCUTANEOUS
  Administered 2015-02-12: 4 [IU] via SUBCUTANEOUS
  Administered 2015-02-13 (×3): 2 [IU] via SUBCUTANEOUS
  Administered 2015-02-14: 4 [IU] via SUBCUTANEOUS
  Administered 2015-02-15 – 2015-02-16 (×4): 2 [IU] via SUBCUTANEOUS
  Administered 2015-02-16: 4 [IU] via SUBCUTANEOUS
  Administered 2015-02-16 (×2): 2 [IU] via SUBCUTANEOUS
  Administered 2015-02-17: 4 [IU] via SUBCUTANEOUS
  Administered 2015-02-17 (×2): 2 [IU] via SUBCUTANEOUS

## 2015-01-31 NOTE — Progress Notes (Signed)
Physical Therapy Treatment Patient Details Name: Sandra Turner MRN: [id number] DOB: 11-Jul-1941 Today's Date: 01/31/2015    History of Present Illness Sandra Turner is a 74 year old female adm with dyspnea, severe aortic stenosis, and atrial fibrillation. 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    PT Comments    Pt is currently POD #12. She is showing very slow progress towards PT goals at this time. Pt declining mobility at beginning of session, however reports that she needs to have a BM, so the [facility] of [facility] Pt with overall poor awareness of maintaining both sternal precautions and breathing techniques. Pt required almost constant cueing for pursed-lip breathing, and only occasionally made corrective changes. Per RN, supplemental O2 was increased from 3L/min to 6L/min at end of session. During all aspects of mobility pt required increased encouragement to participate, however once she initiates movement, she does fairly well.   Follow Up Recommendations  SNF;Supervision/Assistance - 24 hour     Equipment Recommendations  Rolling walker with 5" wheels;3in1 (PT)    Recommendations for Other Services       Precautions / Restrictions Precautions Precautions: Fall;Sternal Precaution Comments: educated and required cueing throughout the session. Poor awareness of sternal precautions in general.  Restrictions Weight Bearing Restrictions: Yes Other Position/Activity Restrictions: Sternal precautions    Mobility  Bed Mobility               General bed mobility comments: Pt was sitting up in the recliner upon PT arrival.   Transfers Overall transfer level: Needs assistance Equipment used: Rolling walker (2 wheeled) Transfers: Sit to/from [employer]Stand;Stand Pivot Transfers Sit to Stand: Min assist;+2 safety/equipment Stand pivot transfers: Min assist;+2 safety/equipment       General transfer comment: VC's to maintain  sternal precautions. Pt states "I can't" over and over again however was able to power-up to full standing fairly well. Pt requires assist for walker maneuvering [facility]   Ambulation/Gait             General Gait Details: Pt declines any further mobility   Stairs            Wheelchair Mobility    Modified Rankin (Stroke Patients Only)       Balance Overall balance assessment: Needs assistance;History of Falls Sitting-balance support: Feet supported;No upper extremity supported Sitting balance-Leahy Scale: Fair     Standing balance support: Bilateral upper extremity supported Standing balance-Leahy Scale: Poor Standing balance comment: Requires UE support to maintain standing balance at this time.                     Cognition Arousal/Alertness: Awake/alert Behavior During Therapy: WFL for tasks assessed/performed;Anxious Overall Cognitive Status: Within Functional Limits for tasks assessed                      Exercises      General Comments        Pertinent Vitals/Pain Pain Assessment: No/denies pain    Home Living                      Prior Function            PT Goals (current goals can now be found in the care plan section) Acute Rehab PT Goals Patient Stated Goal: To be able to use the bathroom PT Goal Formulation: With patient Time For Goal Achievement: 02/04/15 Potential to Achieve Goals: Good Progress  towards PT goals: Not progressing toward goals - comment    Frequency  Min 3X/week    PT Plan Current plan remains appropriate    Co-evaluation             End of Session Equipment Utilized During Treatment: Gait belt;Oxygen Activity Tolerance: Patient limited by fatigue Patient left: in chair;with call bell/phone within reach;with chair alarm set     Time: 1123-1200 PT Time Calculation (min) (ACUTE ONLY): 37 min  Charges:  $Therapeutic Activity: 23-37 mins                    G  Codes:      Conni Slipper 02/20/15, 12:14 PM   Conni Slipper, PT, DPT Acute Rehabilitation Services Pager: [phone]

## 2015-01-31 NOTE — Procedures (Signed)
Intubation Procedure Note Sandra Turner [id number] [birth year]  Procedure: Intubation Indications: Respiratory insufficiency  Procedure Details Consent: Unable to obtain consent because of emergent medical necessity. Time Out: Verified patient identification, verified procedure, site/side was marked, verified correct patient position, special equipment/implants available, medications/allergies/relevent history reviewed, required imaging and test results available.  Performed  Drugs Etomidate 20mg  IV Versed 2mg  IV, Fentanyl 50mcg DL x 1 with GS 3 blade Grade 1 view 7.5 tube passed through cords under direct visualization Placement confirmed with bilateral breath sounds, positive EtCO2 change and smoke in tube  Not long after intubation, she desaturated and she had a severe drop in her oxygenation to 60%.  I elected to extubate her due to the hypoxemia, we discovered a very large mucus plug completely obstructing the endotracheal tube.  I provided bag mask ventilation and then re-intubated her exactly as detailed above with a new tube.  O2 saturation immediately returned to 100%, but she required a bronchoscopy, see detailed note.   Evaluation Hemodynamic Status: BP stable throughout; O2 sats: fell, see above Patient's Current Condition: stable Complications: No apparent complications Patient did tolerate procedure well. Chest X-ray ordered to verify placement.  CXR: tube position acceptable.   Sandra Turner 01/31/2015

## 2015-01-31 NOTE — Progress Notes (Deleted)
RT Note: Recruitment maneuver done due to Spo2 88%. Pt tolerated well, increased peep to 12, SpO2 90%.

## 2015-01-31 NOTE — Progress Notes (Addendum)
Name: Sandra Turner MRN: [id number] DOB: [birth year]    ADMISSION DATE:  01/19/2015 CONSULTATION DATE:  01/26/2015  REFERRING MD :  Barry Dienes  CHIEF COMPLAINT:  Short of breath  BRIEF PATIENT DESCRIPTION:  74 yo female developed progressive dyspnea/hypoxia from AECOPD and pulmonary edema/pleural effusions after AVR with MAZE on 5/18.  SIGNIFICANT EVENTS  5/18 AVR, MAZE 5/20 Start amiodarone, start lasix gtt 5/21 wheezing 5/30: markedly worse , reintubated  STUDIES:  01/17/15 PFT >> FEV1 1.06 (53%), FEV1% 63, TLC 4.11 (86%), DLCO 34%, +BD 01/24/15 Echo >> mild LVH 01/25/15 CT chest >> mod Rt effusion, small Lt effusion  SUBJECTIVE:  Acutely worse, sats 50-70s.      VITAL SIGNS: Temp:  [96.6 F (35.9 C)-98.6 F (37 C)] 98.6 F (37 C) (05/30 1100) Pulse Rate:  [58-84] 84 (05/30 1233) Resp:  [12-34] 34 (05/30 1233) BP: (52-148)/(12-104) 148/54 mmHg (05/30 1233) SpO2:  [86 %-100 %] 96 % (05/30 0827) FiO2 (%):  [40 %-100 %] 100 % (05/30 1233) Weight:  [114 kg (251 lb 5.2 oz)] 114 kg (251 lb 5.2 oz) (05/30 0600)  PHYSICAL EXAMINATION: General: acute distress on bipap Neuro:  Alert, normal strength HEENT:  Pupils reactive Cardiovascular:  Irregular, 2/6 murmur Lungs:  Poor airflow, exp wheezes Abdomen:  Soft, non tender Musculoskeletal:  1+ edema Skin:  No rashes   Recent Labs Lab 01/30/15 0535 01/30/15 1700 01/31/15 0223  NA 128* 129* 129*  K 4.2 4.3 4.6  CL 77* 82* 78*  CO2 39* 35* 36*  BUN 80* 84* 92*  CREATININE 2.87* 3.10* 3.15*  GLUCOSE 103* 147* 98    Recent Labs Lab 01/29/15 0500 01/30/15 0419 01/31/15 0223  HGB 9.2* 9.1* 8.9*  HCT 28.5* 27.6* 27.4*  WBC 24.1* 28.2* 23.3*  PLT 305 243 231   Dg Chest Port 1 View  01/31/2015   CLINICAL DATA:  Shortness of breath and acute respiratory failure  EXAM: PORTABLE CHEST - 1 VIEW  COMPARISON:  5/20 7/6  FINDINGS: Right-sided PICC line is again noted in the distal superior vena cava. Postsurgical changes are  again seen. Bilateral pleural effusions are noted with bibasilar consolidation stable from the prior exam. No new focal abnormality is seen. Aortic calcifications are noted.  IMPRESSION: Stable bibasilar changes.  No significant interval change is noted.   Electronically Signed   By: Alcide Clever M.D.   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 Plan: Resume IV steroids Full vent support Mucomyst nebs May need FOB BDs Diuresis per TCTS May need to tap R effusion Start cefepime C/s resp aspirate   Severe AS. Chronic A fib. S/p AVR and MAZE 5/18. Plan: - post op care, amiodarone per TCTS change to Per tube  - continue ASA per tube  Cc 30 min Shan Levans Beeper  [phone]  Cell  [phone]  If no response or cell goes to voicemail, call beeper [phone]   01/31/2015, 12:49 PM

## 2015-01-31 NOTE — Progress Notes (Signed)
  Echocardiogram 2D Echocardiogram has been performed.  Delcie RochENNINGTON, Santiel Topper 01/31/2015, 4:44 PM

## 2015-01-31 NOTE — Progress Notes (Signed)
Pt B/P 60-70/20-40, HR 50's. Pt sleeping but aroused with stimulation. Urine output 125 total over last 12 hours. MD notified orders received.

## 2015-01-31 NOTE — Procedures (Signed)
PCCM Bronchoscopy Procedure Note  The patient was informed of the risks (including but not limited to bleeding, infection, respiratory failure, lung injury, tooth/oral injury) and benefits of the procedure and gave consent, see chart.  Indication: Respiratory failure, mucus plugging  [facility]  Condition pre procedure: Critically ill, on vent  Medications for procedure: Versed 2mg , Fentanyl 50mcg, etomidate  Procedure description: The bronchoscope was introduced through the endotracheal tube and passed to the bilateral lungs to the level of the subsegmental bronchi throughout the tracheobronchial tree.  Airway exam revealed thick, copious secretions nearly occluding the distal endotracheal tube (tube #2, see intubation note) and the trachea.  The mucus extended into the right and left tracheobronchial trees all the way to the lower lobes. I washed the trachea and bilateral mainstem bronchi and suctioned the airway to clear the secretions.  A sample of mucus was sent.  At the completion of the procedure the airway was completely clear of mucus.  Procedures performed: Therapeutic aspiration of the airways  Specimens sent: Bronch wash for culture  Condition post procedure: critically ill on vent, oxygenation improved  EBL: none  Complications: none  Heber CarolinaBrent Morell Mears, MD [place] PCCM Pager: [phone] Cell: [phone] After 3pm or if no response, call [phone]

## 2015-01-31 NOTE — Progress Notes (Addendum)
Rt called to assess patient for increased WOB. Pt is SOB and is working harder to breathe than earlier tonight. BBS are quite diminished. Albuterol PRN treatment given. BBS improved. However patient still having difficulty breathing. Pt is appears to be very anxious. Pt placed on BiPaP per PRN order. Settings- IPAP: 15, EPAP: 5, RR: 10, FiO2 : 40%. Pt appears more comfortable at this time. Pt agrees it is easier to breathe. Pt Vitals: HR: 74, RR: 23, Sats: 97%. RN present. Pt is resting at this time. RT will continue to monitor.

## 2015-01-31 NOTE — Procedures (Signed)
Arterial Catheter Insertion Procedure Note Sandra AdasRita Melena [id number] 04/28/1941   Procedure: Insertion of Arterial Catheter  Indications: Frequent blood sampling  Procedure Details Consent: Risks of procedure as well as the alternatives and risks of each were explained to the (patient/caregiver).  Consent for procedure obtained. Time Out: Verified patient identification, verified procedure, site/side was marked, verified correct patient position, special equipment/implants available, medications/allergies/relevent history reviewed, required imaging and test results available.  Performed  Maximum sterile technique was used including antiseptics, cap, gloves, gown, hand hygiene, mask and sheet. Skin prep: Chlorhexidine; local anesthetic administered 20 gauge catheter was inserted into left radial artery using the Seldinger technique.  Evaluation Blood flow good; BP tracing good. Complications: Complications of none.   Harley HallmarkKeen, Branda Chaudhary Lyman 01/31/2015

## 2015-01-31 NOTE — Progress Notes (Signed)
12 Days Post-Op Procedure(s) (LRB): AORTIC VALVE REPLACEMENT (AVR) (N/A) MAZE (N/A)  AORTIC ROOT ENLARGEMENT (N/A) TRANSESOPHAGEAL ECHOCARDIOGRAM (TEE) (N/A) Subjective: Better night but after breakfast and voiding 600ml she had acute respiratory failure abnd req intubation by CCM May have had mucous plug  CXR this am with atelectasis and mod R pleural effusion BUN/creat plateau'ed today Hemodynamics stable Objective: Vital signs in last 24 hours: Temp:  [96.6 F (35.9 C)-98.6 F (37 C)] 98.6 F (37 C) (05/30 1100) Pulse Rate:  [58-84] 84 (05/30 1233) Cardiac Rhythm:  [-] Atrial fibrillation (05/30 0000) Resp:  [12-34] 34 (05/30 1233) BP: (52-148)/(12-104) 148/54 mmHg (05/30 1233) SpO2:  [86 %-100 %] 96 % (05/30 0827) FiO2 (%):  [40 %-100 %] 100 % (05/30 1233) Weight:  [251 lb 5.2 oz (114 kg)] 251 lb 5.2 oz (114 kg) (05/30 0600)  Hemodynamic parameters for last 24 hours:  slow afib  Intake/Output from previous day: 05/29 0701 - 05/30 0700 In: 2167.1 [P.O.:480; I.V.:1187.1; IV Piggyback:500] Out: 125 [Urine:125] Intake/Output this shift:      Lab Results:  Recent Labs  01/30/15 0419 01/31/15 0223  WBC 28.2* 23.3*  HGB 9.1* 8.9*  HCT 27.6* 27.4*  PLT 243 231   BMET:  Recent Labs  01/30/15 1700 01/31/15 0223  NA 129* 129*  K 4.3 4.6  CL 82* 78*  CO2 35* 36*  GLUCOSE 147* 98  BUN 84* 92*  CREATININE 3.10* 3.15*  CALCIUM 8.4* 8.5*    PT/INR:  Recent Labs  01/31/15 0223  LABPROT 25.7*  INR 2.38*   ABG    Component Value Date/Time   PHART TEST WILL BE CREDITED 01/20/2015 0930   HCO3 TEST WILL BE CREDITED 01/20/2015 0930   TCO2 21 01/20/2015 1416   ACIDBASEDEF TEST WILL BE CREDITED 01/20/2015 0930   O2SAT 63.3 01/26/2015 0500   CBG (last 3)   Recent Labs  01/30/15 2357 01/31/15 0822 01/31/15 1203  GLUCAP 101* 78 119*    Assessment/Plan: S/P Procedure(s) (LRB): AORTIC VALVE REPLACEMENT (AVR) (N/A) MAZE (N/A)  AORTIC ROOT ENLARGEMENT  (N/A) TRANSESOPHAGEAL ECHOCARDIOGRAM (TEE) (N/A) Repeat echo Vent care per CCM Start enteral feeding Cont renal dopamine-hold lasix   LOS: 12 days    Kathlee Nationseter Van Trigt III 01/31/2015

## 2015-01-31 NOTE — Progress Notes (Signed)
Pt removed from BIPAP and returned to 3L [place]. Vitals: HR: 79, RR: 26, Sats: 96%. Pt stated that she feels better than she did when BIPAP was started. RT will monitor.

## 2015-01-31 NOTE — Progress Notes (Signed)
PT had attempted to work with PT.  Pt did work with PT but only was able to take a few steps.  Pt did desat and went into the 80s.  Nasal cannula was increased from 3 to 5L, however, pt began to desat again. RT was called to bedside and pt was placed on bipap.  Dr. Delford FieldWright was paged to come to the bedside.  O2 sats were labile.  Dr. Delford FieldWright ordered intubation.  Pt was intubated by Dr. Kendrick FriesMcQuaid with use of glide scope and CO2 confirmation was positive.  However, pt began to desat again and little air movement could be auscultated.  Ett was removed for reintubation and a plug was a the end of the ETT.  Pt was reintubated and lavaged followed by a bronchoscopy.  This was emergent and therefore no consent was obtained.  Several plugs were removed and one was sent as a culture.  CXR confirmed ETT and OGT placement.

## 2015-01-31 NOTE — Progress Notes (Signed)
CT surgery p.m. Rounds  Remains in slow atrial fibrillation with adequate blood pressure Resting comfortably on ventilator with sedation protocol Oral prednisone has been converted IV Solu-Medrol per CCM She will receive Coumadin tonight, INR has been greater than 2.0 Continue Maxipime for possible pneumonia-tracheal aspirate cultures pending Probably ready for tube feeds to start tomorrow Creatinine slowly improving, holding Lasix

## 2015-02-01 ENCOUNTER — Inpatient Hospital Stay (HOSPITAL_COMMUNITY): Payer: Medicare Other

## 2015-02-01 DIAGNOSIS — L8990 Pressure ulcer of unspecified site, unspecified stage: Secondary | ICD-10-CM | POA: Diagnosis not present

## 2015-02-01 LAB — PROTIME-INR
INR: 3.43 — ABNORMAL HIGH (ref 0.00–1.49)
Prothrombin Time: 33.8 seconds — ABNORMAL HIGH (ref 11.6–15.2)

## 2015-02-01 LAB — GLUCOSE, CAPILLARY
GLUCOSE-CAPILLARY: 112 mg/dL — AB (ref 65–99)
GLUCOSE-CAPILLARY: 114 mg/dL — AB (ref 65–99)
GLUCOSE-CAPILLARY: 119 mg/dL — AB (ref 65–99)
GLUCOSE-CAPILLARY: 135 mg/dL — AB (ref 65–99)
Glucose-Capillary: 119 mg/dL — ABNORMAL HIGH (ref 65–99)
Glucose-Capillary: 117 mg/dL — ABNORMAL HIGH (ref 65–99)
Glucose-Capillary: 137 mg/dL — ABNORMAL HIGH (ref 65–99)

## 2015-02-01 LAB — POCT I-STAT 3, ART BLOOD GAS (G3+)
Acid-Base Excess: 11 mmol/L — ABNORMAL HIGH (ref 0.0–2.0)
Bicarbonate: 36.4 mEq/L — ABNORMAL HIGH (ref 20.0–24.0)
O2 Saturation: 95 %
PCO2 ART: 48.9 mmHg — AB (ref 35.0–45.0)
PH ART: 7.479 — AB (ref 7.350–7.450)
TCO2: 38 mmol/L (ref 0–100)
pO2, Arterial: 68 mmHg — ABNORMAL LOW (ref 80.0–100.0)

## 2015-02-01 LAB — CBC
HCT: 27 % — ABNORMAL LOW (ref 36.0–46.0)
HEMOGLOBIN: 8.9 g/dL — AB (ref 12.0–15.0)
MCH: 29.8 pg (ref 26.0–34.0)
MCHC: 33 g/dL (ref 30.0–36.0)
MCV: 90.3 fL (ref 78.0–100.0)
Platelets: 210 10*3/uL (ref 150–400)
RBC: 2.99 MIL/uL — AB (ref 3.87–5.11)
RDW: 15 % (ref 11.5–15.5)
WBC: 15.3 10*3/uL — ABNORMAL HIGH (ref 4.0–10.5)

## 2015-02-01 LAB — BASIC METABOLIC PANEL
Anion gap: 13 (ref 5–15)
BUN: 89 mg/dL — ABNORMAL HIGH (ref 6–20)
CO2: 33 mmol/L — ABNORMAL HIGH (ref 22–32)
Calcium: 8.6 mg/dL — ABNORMAL LOW (ref 8.9–10.3)
Chloride: 85 mmol/L — ABNORMAL LOW (ref 101–111)
Creatinine, Ser: 2.27 mg/dL — ABNORMAL HIGH (ref 0.44–1.00)
GFR calc Af Amer: 24 mL/min — ABNORMAL LOW (ref >60)
GFR calc non Af Amer: 20 mL/min — ABNORMAL LOW (ref >60)
Glucose, Bld: 140 mg/dL — ABNORMAL HIGH (ref 65–99)
Potassium: 4.3 mmol/L (ref 3.5–5.1)
Sodium: 131 mmol/L — ABNORMAL LOW (ref 135–145)

## 2015-02-01 MED ORDER — BUDESONIDE 0.25 MG/2ML IN SUSP
0.2500 mg | Freq: Four times a day (QID) | RESPIRATORY_TRACT | Status: DC
  Administered 2015-02-01 – 2015-02-05 (×15): 0.25 mg via RESPIRATORY_TRACT
  Filled 2015-02-01 (×20): qty 2

## 2015-02-01 MED ORDER — VANCOMYCIN HCL 10 G IV SOLR: 1500.0000 mg | INTRAVENOUS | Status: DC

## 2015-02-01 MED ORDER — VANCOMYCIN HCL 10 G IV SOLR
2000.0000 mg | Freq: Once | INTRAVENOUS | Status: AC
  Administered 2015-02-01: 2000 mg via INTRAVENOUS
  Filled 2015-02-01: qty 2000

## 2015-02-01 MED ORDER — PANTOPRAZOLE SODIUM 40 MG PO PACK
40.0000 mg | PACK | Freq: Every day | ORAL | Status: DC
  Administered 2015-02-01 – 2015-02-06 (×6): 40 mg
  Filled 2015-02-01 (×8): qty 20

## 2015-02-01 NOTE — Progress Notes (Addendum)
Pt re-intubated 5/30. FOB at that time revealed mucus plugs Remains intubated RASS -3, not F/C  Filed Vitals:   02/01/15 1158 02/01/15 1200 02/01/15 1300 02/01/15 1548  BP:  133/48 112/54 123/43  Pulse:  79 70 72  Temp: 98.4 F (36.9 C)     TempSrc: Oral     Resp:  22 17 16   Height:      Weight:      SpO2:  99% 100% 100%   Obese, sedated, intubated HEENT WNL Chest clear anteriorly IRIR, rate controlled, 2/6 syst M Abd soft, +BS Symmetric pedal edema MAEs  BMET    Component Value Date/Time   NA 131* 02/01/2015 0510   K 4.3 02/01/2015 0510   CL 85* 02/01/2015 0510   CO2 33* 02/01/2015 0510   GLUCOSE 140* 02/01/2015 0510   BUN 89* 02/01/2015 0510   CREATININE 2.27* 02/01/2015 0510   CALCIUM 8.6* 02/01/2015 0510   GFRNONAA 20* 02/01/2015 0510   GFRAA 24* 02/01/2015 0510    CBC    Component Value Date/Time   WBC 15.3* 02/01/2015 0510   RBC 2.99* 02/01/2015 0510   HGB 8.9* 02/01/2015 0510   HCT 27.0* 02/01/2015 0510   PLT 210 02/01/2015 0510   MCV 90.3 02/01/2015 0510   MCH 29.8 02/01/2015 0510   MCHC 33.0 02/01/2015 0510   RDW 15.0 02/01/2015 0510   LYMPHSABS 1.4 10/25/2014 1100   MONOABS 0.7 10/25/2014 1100   EOSABS 0.5 10/25/2014 1100   BASOSABS 0.0 10/25/2014 1100    CXR: R>L effusions/atx  Cefepime 5/30 >>  Vanc 5/31 >>   Resp culture 5/30 >>   IMPRESSION: S/P AVR/Maze 5/18 Recurrent hypoxic resp failure Pulm edema with effusions Possible PNA AKI Hypervolemia  PLAN: Cont full vent support - settings reviewed and/or adjusted Cont vent bundle Daily SBT if/when meets criteria Cont current abx Follow micro results Decisions re: diuresis per TCTS   Billy Fischeravid Simonds, MD ; [facility] [phone].  After 5:30 PM or weekends, call [phone]  Mucus plugging

## 2015-02-01 NOTE — Progress Notes (Signed)
Initial Nutrition Assessment  DOCUMENTATION CODES:  Morbid obesity  INTERVENTION:  Recommend Vital High Protein formula at goal rate of 60 ml/hr   TF regimen to provide 1440 kcals, 126 gm protein, 1204 ml of free water  NUTRITION DIAGNOSIS:  Inadequate oral intake related to inability to eat as evidenced by NPO status  GOAL:  Provide needs based on ASPEN/SCCM guidelines  MONITOR:  Vent status, TF tolerance, Weight trends, Labs, I & O's  REASON FOR ASSESSMENT:  Ventilator (New Tube Feeding)  ASSESSMENT: 74 yo Female developed progressive dyspnea/hypoxia from AECOPD and pulmonary edema/pleural effusions after AVR with MAZE on 5/18.  Patient s/p procedure 5/31: AORTIC VALVE REPLACEMENT (AVR)  MAZE PROCEDURE AORTIC ROOT ENLARGEMENT   Patient is currently intubated on ventilator support -- OGT in place MV: 9.7 L/min Temp (24hrs), Avg:97.8 F (36.6 C), Min:96.8 F (36 C), Max:98.4 F (36.9 C)   Osmolite 1.5 formula ordered per MD at 30 ml/hr; to provide 1080 kcals, 45 gm protein, 549 ml of free water.  Height:  Ht Readings from Last 1 Encounters:  01/20/15 5\' 2"  (1.575 m)    Weight:  Wt Readings from Last 1 Encounters:  02/01/15 252 lb 3.3 oz (114.4 kg)    Ideal Body Weight:  50 kg  Wt Readings from Last 10 Encounters:  02/01/15 252 lb 3.3 oz (114.4 kg)  01/17/15 247 lb (112.038 kg)  12/27/14 231 lb (104.781 kg)  11/22/14 240 lb (108.863 kg)  10/04/14 241 lb 9.6 oz (109.589 kg)  09/11/14 240 lb 11.2 oz (109.181 kg)  08/16/14 249 lb (112.946 kg)  05/12/14 249 lb (112.946 kg)  04/26/14 236 lb (107.049 kg)  10/26/13 248 lb (112.492 kg)    BMI:  Body mass index is 46.12 kg/(m^2).  Estimated Nutritional Needs:  Kcal:  [phone]  Protein:  125-135 gm  Fluid:  per MD  Skin:  Wound (see comment) (Stage I to bilateral ears)  Diet Order:  NPO  EDUCATION NEEDS:  No education needs identified at this time   Intake/Output Summary (Last 24  hours) at 02/01/15 1533 Last data filed at 02/01/15 1300  Gross per 24 hour  Intake 2948.55 ml  Output   1995 ml  Net 953.55 ml    Last BM:  5/29  Sandra Turner, RD, LDN Pager #: [phone] After-Hours Pager #: [phone]

## 2015-02-01 NOTE — Progress Notes (Addendum)
[address]       [name] [address]             [phone]        CARDIOTHORACIC SURGERY PROGRESS NOTE   R13 Days Post-Op Procedure(s) (LRB): AORTIC VALVE REPLACEMENT (AVR) (N/A) MAZE (N/A)  AORTIC ROOT ENLARGEMENT (N/A) TRANSESOPHAGEAL ECHOCARDIOGRAM (TEE) (N/A)  Subjective: Sedated on vent but responds to commands.  Denies pain and looks comfortable.  Reportedly has copious thick airway secretions  Objective: Vital signs: BP Readings from Last 1 Encounters:  02/01/15 93/91   Pulse Readings from Last 1 Encounters:  02/01/15 75   Resp Readings from Last 1 Encounters:  02/01/15 15   Temp Readings from Last 1 Encounters:  02/01/15 98.4 F (36.9 C) Oral    Hemodynamics:    Physical Exam:  Rhythm:   Afib w/ controlled rate  Breath sounds: coarse  Heart sounds:  irregular  Incisions:  Clean and dry  Abdomen:  Soft, non-distended, non-tender  Extremities:  Warm, well-perfused    Intake/Output from previous day: 05/30 0701 - 05/31 0700 In: 2802.8 [P.O.:300; I.V.:2282.8; NG/GT:170; IV Piggyback:50] Out: 2000 [Urine:1850; Emesis/NG output:150] Intake/Output this shift:    Lab Results:  CBC: Recent Labs  01/31/15 0223 02/01/15 0510  WBC 23.3* 15.3*  HGB 8.9* 8.9*  HCT 27.4* 27.0*  PLT 231 210    BMET:  Recent Labs  01/31/15 1600 02/01/15 0510  NA 127* 131*  K 4.4 4.3  CL 84* 85*  CO2 34* 33*  GLUCOSE 146* 140*  BUN 97* 89*  CREATININE 2.90* 2.27*  CALCIUM 7.9* 8.6*     CBG (last 3)   Recent Labs  01/31/15 2341 02/01/15 0423 02/01/15 0824  GLUCAP 119* 112* 114*    ABG    Component Value Date/Time   PHART 7.479* 02/01/2015 0516   PCO2ART 48.9* 02/01/2015 0516   PO2ART 68.0* 02/01/2015 0516   HCO3 36.4* 02/01/2015 0516   TCO2 38 02/01/2015 0516   ACIDBASEDEF TEST WILL BE CREDITED 01/20/2015 0930   O2SAT 95.0 02/01/2015 0516    CXR: PORTABLE CHEST - 1 VIEW  COMPARISON:  01/31/2015  FINDINGS: Cardiomediastinal silhouette unchanged. Right rotation accentuates the mediastinum.  Persisting bibasilar opacities obscuring the heart borders in the hemidiaphragms.  Veiled opacities the bilateral lung bases. No pneumothorax.  Surgical changes of prior median sternotomy.  Surgical changes of prior left atrial amputation.  Unchanged position of endotracheal tube, gastric tube, right upper extremity PICC.  Endotracheal tube terminates 2.5 cm above the carina.  IMPRESSION: Unchanged bilateral pleural effusions with associated airspace/ interstitial disease, potentially a combination of atelectasis, pleural effusion, and/ or consolidation.  Unchanged support apparatus, as above.  Signed,  Yvone Neu. Loreta Ave, DO  [facility]  [facility]   Electronically Signed  By: Gilmer Mor D.O.  On: 02/01/2015 08:01    Transthoracic Echocardiography  Patient:  Sandra Turner, Stotts MR #:    [id number] Study Date: 01/31/2015 Gender:   F Age:    73 Height:   157.5 cm Weight:   113.9 kg BSA:    2.3 m^2 Pt. Status: Room:    2S11C  ADMITTING  Tressie Stalker, M.D. ATTENDING  Tressie Stalker, M.D. Melchor Amour SONOGRAPHER Lauren Pennington, RDCS, CCT PERFORMING  [facility], Inpatient  cc:  ------------------------------------------------------------------- LV EF: 55% -  60%  ------------------------------------------------------------------- Indications:   CHF - 428.0.  ------------------------------------------------------------------- History:  PMH:  Dyspnea. Atrial fibrillation. Chronic obstructive pulmonary disease.  Risk factors: Status post Aortic valve replacement and MAZE procedure. Pulmonary hypertension.  ------------------------------------------------------------------- Study Conclusions  - Left ventricle: The cavity size was  normal. Wall thickness was increased in a pattern of mild LVH. Systolic function was normal. The estimated ejection fraction was in the range of 55% to 60%. Wall motion was normal; there were no regional wall motion abnormalities. Doppler parameters are consistent with high ventricular filling pressure. - Aortic valve: Valve area (VTI): 1.6 cm^2. Valve area (Vmax): 1.57 cm^2. Valve area (Vmean): 1.54 cm^2. - Mitral valve: Calcified annulus. There was mild regurgitation. - Left atrium: The atrium was moderately dilated. - Right atrium: The atrium was mildly dilated. - Pulmonary arteries: PA peak pressure: 53 mm Hg (S).  Transthoracic echocardiography. M-mode, complete 2D, spectral Doppler, and color Doppler. Birthdate: Patient birthdate: [birth year]. Age: Patient is 74 yr old. Sex: Gender: female. BMI: 45.9 kg/m^2. Blood pressure:   150/50 Patient status: Inpatient. Study date: Study date: 01/31/2015. Study time: 03:42 PM. Location: ICU/CCU  -------------------------------------------------------------------  ------------------------------------------------------------------- Left ventricle: The cavity size was normal. Wall thickness was increased in a pattern of mild LVH. Systolic function was normal. The estimated ejection fraction was in the range of 55% to 60%. Wall motion was normal; there were no regional wall motion abnormalities. Normal sinus rhythm was absent. Doppler parameters are consistent with high ventricular filling pressure.  ------------------------------------------------------------------- Aortic valve: The prosthetic aortic valve leaflets are not well seen. There does not appear to be any significant aortic stenosis or regurgitation by color flow imaging. Doppler: Transvalvular velocity was within the normal range. There was no stenosis. There was no regurgitation.  VTI ratio of LVOT to aortic valve: 0.63. Valve area (VTI): 1.6  cm^2. Indexed valve area (VTI): 0.7 cm^2/m^2. Peak velocity ratio of LVOT to aortic valve: 0.62. Valve area (Vmax): 1.57 cm^2. Indexed valve area (Vmax): 0.68 cm^2/m^2. Mean velocity ratio of LVOT to aortic valve: 0.61. Valve area (Vmean): 1.54 cm^2. Indexed valve area (Vmean): 0.67 cm^2/m^2.  Mean gradient (S): 12 mm Hg. Peak gradient (S): 22 mm Hg.  ------------------------------------------------------------------- Aorta: Aortic root: The aortic root was normal in size.  ------------------------------------------------------------------- Mitral valve:  Calcified annulus. Mobility was not restricted. Doppler: Transvalvular velocity was within the normal range. There was no evidence for stenosis. There was mild regurgitation.  Peak gradient (D): 11 mm Hg.  ------------------------------------------------------------------- Left atrium: The atrium was moderately dilated.  ------------------------------------------------------------------- Right ventricle: The cavity size was normal. Wall thickness was normal. Systolic function was normal.  ------------------------------------------------------------------- Pulmonic valve:  Poorly visualized. Doppler: Transvalvular velocity was within the normal range. There was no evidence for stenosis. There was no significant regurgitation.  ------------------------------------------------------------------- Tricuspid valve:  Structurally normal valve.  Doppler: Transvalvular velocity was within the normal range. There was mild regurgitation.  ------------------------------------------------------------------- Pulmonary artery:  The main pulmonary artery was normal-sized. Systolic pressure was within the normal range.  ------------------------------------------------------------------- Right atrium: The atrium was mildly dilated.  ------------------------------------------------------------------- Pericardium: There was no  pericardial effusion.  ------------------------------------------------------------------- Systemic veins: Inferior vena cava: The vessel was normal in size.  ------------------------------------------------------------------- Measurements  Left ventricle              Value     Reference LV ID, ED, PLAX chordal      (L)   40.7 mm    43 - 52 LV ID, ES, PLAX chordal          23.6 mm    23 - 38 LV fx shortening, PLAX chordal  42  %    >=29 LV PW thickness, ED            13.5 mm    --------- IVS/LV PW ratio, ED            0.8      <=1.3 Stroke volume, 2D             67  ml    --------- Stroke volume/bsa, 2D           29  ml/m^2  --------- LV e&', medial               7.02 cm/s   --------- LV E/e&', medial              23.79     ---------  Ventricular septum            Value     Reference IVS thickness, ED             10.8 mm    ---------  LVOT                   Value     Reference LVOT ID, S                18  mm    --------- LVOT area                 2.54 cm^2   --------- LVOT peak velocity, S           145  cm/s   --------- LVOT mean velocity, S           91.8 cm/s   --------- LVOT VTI, S                26.3 cm    --------- LVOT peak gradient, S           8   mm Hg  ---------  Aortic valve               Value     Reference Aortic valve peak velocity, S       234  cm/s   --------- Aortic valve mean velocity, S       151  cm/s   --------- Aortic valve VTI, S            41.8 cm    --------- Aortic mean gradient, S          12  mm Hg  --------- Aortic peak gradient, S          22   mm Hg  --------- VTI ratio, LVOT/AV            0.63      --------- Aortic valve area, VTI          1.6  cm^2   --------- Aortic valve area/bsa, VTI        0.7  cm^2/m^2 --------- Velocity ratio, peak, LVOT/AV       0.62      --------- Aortic valve area, peak velocity     1.57 cm^2   --------- Aortic valve area/bsa, peak        0.68 cm^2/m^2 --------- velocity Velocity ratio, mean, LVOT/AV       0.61      --------- Aortic valve area, mean velocity     1.54 cm^2   --------- Aortic valve area/bsa, mean        0.67 cm^2/m^2 --------- velocity  Left atrium  Value     Reference LA ID, A-P, ES              51  mm    --------- LA ID/bsa, A-P          (H)   2.22 cm/m^2  <=2.2 LA volume, S               98  ml    --------- LA volume/bsa, S             42.6 ml/m^2  --------- LA volume, ES, 1-p A4C          85  ml    --------- LA volume/bsa, ES, 1-p A4C        36.9 ml/m^2  --------- LA volume, ES, 1-p A2C          109  ml    --------- LA volume/bsa, ES, 1-p A2C        47.4 ml/m^2  ---------  Mitral valve               Value     Reference Mitral E-wave peak velocity        167  cm/s   --------- Mitral A-wave peak velocity        45.9 cm/s   --------- Mitral deceleration time     (H)   243  ms    150 - 230 Mitral peak gradient, D          11  mm Hg  --------- Mitral E/A ratio, peak          3.6      --------- Mitral maximal regurg velocity,      504  cm/s   --------- PISA Mitral regurg VTI, PISA          116  cm    ---------  Pulmonary arteries            Value     Reference PA pressure, S, DP         (H)   53  mm Hg  <=30  Tricuspid valve              Value     Reference Tricuspid regurg peak velocity      336  cm/s   --------- Tricuspid peak RV-RA gradient       45  mm Hg  ---------  Systemic veins              Value     Reference Estimated CVP               8   mm Hg  ---------  Right ventricle              Value     Reference RV pressure, S, DP        (H)   53  mm Hg  <=30 RV s&', lateral, S             10.3 cm/s   ---------  Legend: (L) and (H) mark values outside specified reference range.  ------------------------------------------------------------------- Prepared and Electronically Authenticated by  Cassell Clement 2016-05-30T18:27:01    Assessment/Plan: S/P Procedure(s) (LRB): AORTIC VALVE REPLACEMENT (AVR) (N/A) MAZE (N/A)  AORTIC ROOT ENLARGEMENT (N/A) TRANSESOPHAGEAL ECHOCARDIOGRAM (TEE) (N/A)  Stable overnight Reintubated 5/30 for acute on chronic hypoxemic respiratory failure Likely HCAP day #1 on Cefepime, ET aspirate culture pending Maintaining stable rate-controlled Afib on IV amiodarone Chronic diastolic CHF and pulmonary hypertension, normal LV  systolic function, normal aortic valve function Bilateral pleural effusions, R>L Acute renal insufficiency, likely pre-renal azotemia +/- ATN - appears to be resolving w/ creatinine down to 2.3 this morning - diuretics on hold Leukocytosis trending down - may be due to pneumonia but has been receiving high dose steroids Expected post op acute blood loss anemia, stable COPD Morbid obesity   Will add empiric Vanc for HCAP  Hold coumadin today  Will plan to place chest tubes for pleural effusions once INR decreased  Start tube feeds  Consider weaning steroids - unclear role/benefit at this point  Vent and sedation management per Pulm/CCM     Purcell Nails 02/01/2015 9:04 AM

## 2015-02-01 NOTE — Progress Notes (Signed)
TCTS BRIEF SICU PROGRESS NOTE  13 Days Post-Op  S/P Procedure(s) (LRB): AORTIC VALVE REPLACEMENT (AVR) (N/A) MAZE (N/A)  AORTIC ROOT ENLARGEMENT (N/A) TRANSESOPHAGEAL ECHOCARDIOGRAM (TEE) (N/A)   Stable day  Plan: Continue current plan  Purcell NailsClarence H Lindbergh Winkles 02/01/2015 6:21 PM

## 2015-02-01 NOTE — Progress Notes (Signed)
ANTIBIOTIC CONSULT NOTE - INITIAL  Pharmacy Consult for Vancomycin Indication: pneumonia (HCAP)  Allergies  Allergen Reactions  . Bee Venom Swelling  . Latex Rash  . Penicillins Rash    Patient Measurements: Height:  (157.5 cm) Weight: 252 lb 3.3 oz (114.4 kg) IBW/kg (Calculated) : 50.1  Vital Signs: Temp: 98.4 F (36.9 C) (05/31 0827) Temp Source: Oral (05/31 0827) BP: 136/54 mmHg (05/31 0904) Pulse Rate: 78 (05/31 0904) Intake/Output from previous day: 05/30 0701 - 05/31 0700 In: 2802.8 [P.O.:300; I.V.:2282.8; NG/GT:170; IV Piggyback:50] Out: 2000 [Urine:1850; Emesis/NG output:150] Intake/Output from this shift: Total I/O In: 338.4 [I.V.:338.4] Out: 145 [Urine:145]  Labs:  Recent Labs  01/30/15 0419  01/31/15 0223 01/31/15 1600 02/01/15 0510  WBC 28.2*  --  23.3*  --  15.3*  HGB 9.1*  --  8.9*  --  8.9*  PLT 243  --  231  --  210  CREATININE  --   < > 3.15* 2.90* 2.27*  < > = values in this interval not displayed. Estimated Creatinine Clearance: 26.8 mL/min (by C-G formula based on Cr of 2.27). No results for input(s): VANCOTROUGH, VANCOPEAK, VANCORANDOM, GENTTROUGH, GENTPEAK, GENTRANDOM, TOBRATROUGH, TOBRAPEAK, TOBRARND, AMIKACINPEAK, AMIKACINTROU, AMIKACIN in the last 72 hours.   Microbiology: Recent Results (from the past 720 hour(s))  Surgical pcr screen     Status: None   Collection Time: 01/17/15 12:48 PM  Result Value Ref Range Status   MRSA, PCR NEGATIVE NEGATIVE Final   Staphylococcus aureus NEGATIVE NEGATIVE Final    Comment:        The Xpert SA Assay (FDA approved for NASAL specimens in patients over 5 years of age), is one component of a comprehensive surveillance program.  Test performance has been validated by [facility] for patients greater than or equal to 48 year old. It is not intended to diagnose infection nor to guide or monitor treatment.   Culture, respiratory (NON-Expectorated)     Status: None (Preliminary result)   Collection Time: 01/31/15  1:15 PM  Result Value Ref Range Status   Specimen Description TRACHEAL ASPIRATE  Final   Special Requests NONE  Final   Gram Stain   Final    RARE WBC PRESENT, PREDOMINANTLY PMN NO SQUAMOUS EPITHELIAL CELLS SEEN NO ORGANISMS SEEN Performed at [facility]    Culture   Final    Culture reincubated for better growth Performed at [facility]    Report Status PENDING  Incomplete    Medical History: Past Medical History  Diagnosis Date  . Arthritis   . Chronic atrial fibrillation     takes Pradaxa daily  . Aortic stenosis     Severe by echo August 2015  . Pneumonia 09/2014  . History of bronchitis 2015  . Joint pain   . Back pain     reason unknown  . Family history of adverse reaction to anesthesia     uncle with MH in the 60's,cousin in the 24's with MH  . 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 acetylcysteine  3 mL Nebulization TID  . albuterol  2.5 mg Nebulization Q4H  .  antiseptic oral rinse  7 mL Mouth Rinse QID  . bisacodyl  10 mg Rectal Daily  . ceFEPime (MAXIPIME) IV  1 g Intravenous Q24H  . chlorhexidine  15 mL Mouth Rinse BID  . fentaNYL (SUBLIMAZE) injection  50 mcg Intravenous Once  . insulin aspart  0-24 Units Subcutaneous 6 times per day  . methylPREDNISolone (SOLU-MEDROL) injection  60 mg Intravenous Q6H  . pantoprazole sodium  40 mg Per Tube Daily  . sodium chloride  10-40 mL Intracatheter Q12H  . sodium chloride  3 mL Intravenous Q12H  . Warfarin - Physician Dosing Inpatient   Does not apply q1800   Infusions:  . sodium chloride Stopped  (01/30/15 1900)  . sodium chloride 75 mL/hr at 02/01/15 0913  . amiodarone 30 mg/hr (02/01/15 0700)  . DOPamine 3 mcg/kg/min (02/01/15 0400)  . feeding supplement (OSMOLITE 1.5 CAL)    . fentaNYL infusion INTRAVENOUS 50 mcg/hr (02/01/15 0400)   Assessment: 74 yo F POD #10 AVR/Maze.  Pt experienced worsening SOB yesterday requiring re-intubation and is now starting on antibiotics for HCAP coverage.  WBC elevated but is trending down (pt also on steroids).  Renal function is also improving (SCr 3.15 >> 2.27).  Goal of Therapy:  Vancomycin trough level 15-20 mcg/ml  Plan:  Vanc 2gm IV x 1, then 1500 mg IV q 48h (per obesity nomogram)  Continue Cefepime 1gm q24h Follow up renal function, culture results, and clinical progress  Toys 'R' UsKimberly Cammeron Greis, Pharm.D., BCPS Clinical Pharmacist Pager [phone] 02/01/2015 10:22 AM

## 2015-02-02 ENCOUNTER — Inpatient Hospital Stay (HOSPITAL_COMMUNITY): Payer: Medicare Other

## 2015-02-02 LAB — BASIC METABOLIC PANEL
Anion gap: 11 (ref 5–15)
BUN: 89 mg/dL — AB (ref 6–20)
CALCIUM: 8.8 mg/dL — AB (ref 8.9–10.3)
CO2: 33 mmol/L — AB (ref 22–32)
Chloride: 88 mmol/L — ABNORMAL LOW (ref 101–111)
Creatinine, Ser: 1.82 mg/dL — ABNORMAL HIGH (ref 0.44–1.00)
GFR calc Af Amer: 31 mL/min — ABNORMAL LOW (ref >60)
GFR calc non Af Amer: 27 mL/min — ABNORMAL LOW (ref >60)
Glucose, Bld: 140 mg/dL — ABNORMAL HIGH (ref 65–99)
POTASSIUM: 3.8 mmol/L (ref 3.5–5.1)
SODIUM: 132 mmol/L — AB (ref 135–145)

## 2015-02-02 LAB — CULTURE, RESPIRATORY

## 2015-02-02 LAB — CBC
HCT: 27.7 % — ABNORMAL LOW (ref 36.0–46.0)
HEMOGLOBIN: 9 g/dL — AB (ref 12.0–15.0)
MCH: 29.3 pg (ref 26.0–34.0)
MCHC: 32.5 g/dL (ref 30.0–36.0)
MCV: 90.2 fL (ref 78.0–100.0)
Platelets: 224 10*3/uL (ref 150–400)
RBC: 3.07 MIL/uL — ABNORMAL LOW (ref 3.87–5.11)
RDW: 15.1 % (ref 11.5–15.5)
WBC: 19.4 10*3/uL — AB (ref 4.0–10.5)

## 2015-02-02 LAB — GLUCOSE, CAPILLARY
GLUCOSE-CAPILLARY: 117 mg/dL — AB (ref 65–99)
GLUCOSE-CAPILLARY: 125 mg/dL — AB (ref 65–99)
Glucose-Capillary: 115 mg/dL — ABNORMAL HIGH (ref 65–99)
Glucose-Capillary: 124 mg/dL — ABNORMAL HIGH (ref 65–99)
Glucose-Capillary: 126 mg/dL — ABNORMAL HIGH (ref 65–99)
Glucose-Capillary: 138 mg/dL — ABNORMAL HIGH (ref 65–99)

## 2015-02-02 LAB — POCT I-STAT 3, ART BLOOD GAS (G3+)
Acid-Base Excess: 12 mmol/L — ABNORMAL HIGH (ref 0.0–2.0)
Bicarbonate: 36.1 mEq/L — ABNORMAL HIGH (ref 20.0–24.0)
O2 Saturation: 95 %
PH ART: 7.521 — AB (ref 7.350–7.450)
Patient temperature: 97.8
TCO2: 37 mmol/L (ref 0–100)
pCO2 arterial: 44 mmHg (ref 35.0–45.0)
pO2, Arterial: 65 mmHg — ABNORMAL LOW (ref 80.0–100.0)

## 2015-02-02 LAB — CULTURE, RESPIRATORY W GRAM STAIN

## 2015-02-02 LAB — PROTIME-INR
INR: 4.05 — ABNORMAL HIGH (ref 0.00–1.49)
Prothrombin Time: 38.4 seconds — ABNORMAL HIGH (ref 11.6–15.2)

## 2015-02-02 MED ORDER — FUROSEMIDE 10 MG/ML IJ SOLN
40.0000 mg | Freq: Two times a day (BID) | INTRAMUSCULAR | Status: DC
  Administered 2015-02-02 – 2015-02-06 (×10): 40 mg via INTRAVENOUS
  Filled 2015-02-02 (×12): qty 4

## 2015-02-02 MED ORDER — VANCOMYCIN HCL 10 G IV SOLR
1250.0000 mg | INTRAVENOUS | Status: DC
  Administered 2015-02-02 – 2015-02-04 (×3): 1250 mg via INTRAVENOUS
  Filled 2015-02-02 (×3): qty 1250

## 2015-02-02 MED ORDER — POTASSIUM CHLORIDE 10 MEQ/50ML IV SOLN
10.0000 meq | INTRAVENOUS | Status: AC
  Administered 2015-02-02 (×3): 10 meq via INTRAVENOUS
  Filled 2015-02-02: qty 50

## 2015-02-02 NOTE — Progress Notes (Signed)
ANTIBIOTIC CONSULT NOTE - INITIAL  Pharmacy Consult for Vancomycin Indication: pneumonia (HCAP)  Allergies  Allergen Reactions  . Bee Venom Swelling  . Latex Rash  . Penicillins Rash    Patient Measurements: Height: 5\' 2"  (157.5 cm) Weight: 253 lb 15.5 oz (115.2 kg) IBW/kg (Calculated) : 50.1  Vital Signs: Temp: 98.5 F (36.9 C) (06/01 1145) Temp Source: Oral (06/01 1145) BP: 127/38 mmHg (06/01 1200) Pulse Rate: 76 (06/01 1200) Intake/Output from previous day: 05/31 0701 - 06/01 0700 In: 2779.9 [I.V.:1806; NG/GT:423.8; IV Piggyback:550] Out: 1615 [Urine:1465; Emesis/NG output:150] Intake/Output from this shift: Total I/O In: 448 [I.V.:188; NG/GT:210; IV Piggyback:50] Out: 300 [Urine:300]  Labs:  Recent Labs  01/31/15 0223 01/31/15 1600 02/01/15 0510 02/02/15 0410  WBC 23.3*  --  15.3* 19.4*  HGB 8.9*  --  8.9* 9.0*  PLT 231  --  210 224  CREATININE 3.15* 2.90* 2.27* 1.82*   Estimated Creatinine Clearance: 33.6 mL/min (by C-G formula based on Cr of 1.82). No results for input(s): VANCOTROUGH, VANCOPEAK, VANCORANDOM, GENTTROUGH, GENTPEAK, GENTRANDOM, TOBRATROUGH, TOBRAPEAK, TOBRARND, AMIKACINPEAK, AMIKACINTROU, AMIKACIN in the last 72 hours.   Microbiology: Recent Results (from the past 720 hour(s))  Surgical pcr screen     Status: None   Collection Time: 01/17/15 12:48 PM  Result Value Ref Range Status   MRSA, PCR NEGATIVE NEGATIVE Final   Staphylococcus aureus NEGATIVE NEGATIVE Final    Comment:        The Xpert SA Assay (FDA approved for NASAL specimens in patients over 73 years of age), is one component of a comprehensive surveillance program.  Test performance has been validated by [facility] for patients greater than or equal to 73 year old. It is not intended to diagnose infection nor to guide or monitor treatment.   Culture, respiratory (NON-Expectorated)     Status: None   Collection Time: 01/31/15  1:15 PM  Result Value Ref Range Status    Specimen Description TRACHEAL ASPIRATE  Final   Special Requests NONE  Final   Gram Stain   Final    RARE WBC PRESENT, PREDOMINANTLY PMN NO SQUAMOUS EPITHELIAL CELLS SEEN NO ORGANISMS SEEN Performed at [facility]    Culture   Final    Non-Pathogenic Oropharyngeal-type Flora Isolated. Performed at [facility]    Report Status 02/02/2015 FINAL  Final    Medical History: Past Medical History  Diagnosis Date  . Arthritis   . Chronic atrial fibrillation     takes Pradaxa daily  . Aortic stenosis     Severe by echo August 2015  . Pneumonia 09/2014  . History of bronchitis 2015  . Joint pain   . Back pain     reason unknown  . Family history of adverse reaction to anesthesia     uncle with MH in the 60's,cousin in the 5170's with MH  . 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 acetylcysteine  3 mL Nebulization TID  . albuterol  2.5 mg Nebulization Q4H  . antiseptic oral rinse  7 mL Mouth Rinse QID  . bisacodyl  10 mg Rectal Daily  . budesonide  0.25  mg Nebulization 4 times per day  . ceFEPime (MAXIPIME) IV  1 g Intravenous Q24H  . chlorhexidine  15 mL Mouth Rinse BID  . fentaNYL (SUBLIMAZE) injection  50 mcg Intravenous Once  . furosemide  40 mg Intravenous Q12H  . insulin aspart  0-24 Units Subcutaneous 6 times per day  . pantoprazole sodium  40 mg Per Tube Daily  . potassium chloride  10 mEq Intravenous Q1 Hr x 3  . sodium chloride  10-40 mL Intracatheter Q12H  . sodium chloride  3 mL Intravenous Q12H  . vancomycin  1,250 mg Intravenous Q24H  . Warfarin - Physician Dosing Inpatient   Does  not apply q1800   Infusions:  . sodium chloride Stopped (01/30/15 1900)  . sodium chloride Stopped (02/02/15 1156)  . amiodarone 30 mg/hr (02/02/15 1200)  . DOPamine 2 mcg/kg/min (02/02/15 1130)  . feeding supplement (OSMOLITE 1.5 CAL) 1,000 mL (02/02/15 0400)  . fentaNYL infusion INTRAVENOUS 50 mcg/hr (02/02/15 1200)   Assessment: 74 yo F POD #11 AVR/Maze.  Pt experienced worsening SOB on 5/30 requiring re-intubation and is now starting on antibiotics for HCAP coverage.   Remains afebrile, WBC trending up to 19.4.  Renal function is improving (SCr 3.15 >> 1.82) with normalized CrCl ~37 ml/min.  Cefepime 5/30>> Vanc 5/31>>  5/30 TA>>non-pathogenic  Goal of Therapy:  Vancomycin trough level 15-20 mcg/ml  Plan:  - Adjust vancomycin to 1250 mg IV q24h with improvement in renal function (per obesity nomogram)   - Continue Cefepime 1gm q24h - Follow up renal function, culture results, and clinical progress  Makynzee Tigges K. Bonnye Fava, PharmD, BCPS Clinical Pharmacist - Resident Pager: [phone] Pharmacy: [phone] 02/02/2015 12:17 PM

## 2015-02-02 NOTE — Progress Notes (Signed)
Little change. Not weaning Remains intubated RASS -1, not F/C  Filed Vitals:   02/02/15 1400 02/02/15 1500 02/02/15 1525 02/02/15 1600  BP: 116/40 110/38  118/44  Pulse: 65 71  71  Temp:    98 F (36.7 C)  TempSrc:    Oral  Resp: 15 18  18   Height:      Weight:      SpO2: 94% 97% 98% 97%   Obese, sedated, intubated HEENT WNL Chest clear anteriorly IRIR, rate controlled, 2/6 syst M Abd soft, +BS Symmetric pedal edema MAEs  BMET    Component Value Date/Time   NA 132* 02/02/2015 0410   K 3.8 02/02/2015 0410   CL 88* 02/02/2015 0410   CO2 33* 02/02/2015 0410   GLUCOSE 140* 02/02/2015 0410   BUN 89* 02/02/2015 0410   CREATININE 1.82* 02/02/2015 0410   CALCIUM 8.8* 02/02/2015 0410   GFRNONAA 27* 02/02/2015 0410   GFRAA 31* 02/02/2015 0410    CBC    Component Value Date/Time   WBC 19.4* 02/02/2015 0410   RBC 3.07* 02/02/2015 0410   HGB 9.0* 02/02/2015 0410   HCT 27.7* 02/02/2015 0410   PLT 224 02/02/2015 0410   MCV 90.2 02/02/2015 0410   MCH 29.3 02/02/2015 0410   MCHC 32.5 02/02/2015 0410   RDW 15.1 02/02/2015 0410   LYMPHSABS 1.4 10/25/2014 1100   MONOABS 0.7 10/25/2014 1100   EOSABS 0.5 10/25/2014 1100   BASOSABS 0.0 10/25/2014 1100    CXR: R>L effusions/atx  Cefepime 5/30 >>  Vanc 5/31 >>   Resp culture 5/30 >>   IMPRESSION: S/P AVR/Maze 5/18 Recurrent hypoxic resp failure Pulm edema with effusions Possible PNA, NOS AKI, Cr improving Hypervolemia  PLAN: Cont full vent support - settings reviewed and/or adjusted Cont vent bundle Daily SBT if/when meets criteria Cont current abx Follow micro results Decisions re: diuresis per TCTS   Billy Fischeravid Simonds, MD ; [facility] service Mobile [phone].  After 5:30 PM or weekends, call [phone]

## 2015-02-02 NOTE — Care Management Note (Signed)
Case Management Note  Patient Details  Name: Sandra Turner MRN: [id number] Date of Birth: Dec 01, 1940  Subjective/Objective:   Continued Intubation on Vent. Will continue to monitor progress.                 Action/Plan:   Expected Discharge Date:                  Expected Discharge Plan:  Home w Home Health Services  In-House Referral:     Discharge planning Services     Post Acute Care Choice:    Choice offered to:     DME Arranged:    DME Agency:     HH Arranged:    HH Agency:     Status of Service:  In process, will continue to follow  Medicare Important Message Given:    Date Medicare IM Given:    Medicare IM give by:    Date Additional Medicare IM Given:    Additional Medicare Important Message give by:     If discussed at Long Length of Stay Meetings, dates discussed:    Additional Comments:  Yvone NeuCrutchfield, Lynetta Tomczak M, RN 02/02/2015, 11:52 AM

## 2015-02-02 NOTE — Care Management Note (Signed)
Case Management Note  Patient Details  Name: Sandra Turner MRN: [id number] Date of Birth: 12/29/1940  Subjective/Objective:                    Action/Plan:   Expected Discharge Date:                  Expected Discharge Plan:  Home w Home Health Services  In-House Referral:     Discharge planning Services     Post Acute Care Choice:    Choice offered to:     DME Arranged:    DME Agency:     HH Arranged:    HH Agency:     Status of Service:  In process, will continue to follow  Medicare Important Message Given:    Date Medicare IM Given:    Medicare IM give by:    Date Additional Medicare IM Given:    Additional Medicare Important Message give by:     If discussed at Long Length of Stay Meetings, dates discussed:  02/01/2015  Additional Comments:  Yvone Neurutchfield, Santez Woodcox M, RN 02/02/2015, 9:03 AM

## 2015-02-02 NOTE — Progress Notes (Signed)
[address]       [place] [id number]             [phone]        CARDIOTHORACIC SURGERY PROGRESS NOTE   R14 Days Post-Op Procedure(s) (LRB): AORTIC VALVE REPLACEMENT (AVR) (N/A) MAZE (N/A)  AORTIC ROOT ENLARGEMENT (N/A) TRANSESOPHAGEAL ECHOCARDIOGRAM (TEE) (N/A)  Subjective: Lightly sedated on vent.  Denies pain.  Looks comfortable.  Objective: Vital signs: BP Readings from Last 1 Encounters:  02/02/15 124/47   Pulse Readings from Last 1 Encounters:  02/02/15 77   Resp Readings from Last 1 Encounters:  02/02/15 15   Temp Readings from Last 1 Encounters:  02/02/15 98.5 F (36.9 C) Oral    Hemodynamics:    Physical Exam:  Rhythm:   Afib w/ controlled HR  Breath sounds: Coarse but fairly clear  Heart sounds:  irregular  Incisions:  Clean and dry  Abdomen:  Soft, non-distended, non-tender  Extremities:  Warm, well-perfused    Intake/Output from previous day: 05/31 0701 - 06/01 0700 In: 2779.9 [I.V.:1806; NG/GT:423.8; IV Piggyback:550] Out: 1615 [Urine:1465; Emesis/NG output:150] Intake/Output this shift: Total I/O In: 77.8 [I.V.:47.8; NG/GT:30] Out: 100 [Urine:100]  Lab Results:  CBC: Recent Labs  02/01/15 0510 02/02/15 0410  WBC 15.3* 19.4*  HGB 8.9* 9.0*  HCT 27.0* 27.7*  PLT 210 224    BMET:  Recent Labs  02/01/15 0510 02/02/15 0410  NA 131* 132*  K 4.3 3.8  CL 85* 88*  CO2 33* 33*  GLUCOSE 140* 140*  BUN 89* 89*  CREATININE 2.27* 1.82*  CALCIUM 8.6* 8.8*     CBG (last 3)   Recent Labs  02/01/15 2328 02/02/15 0357 02/02/15 0741  GLUCAP 135* 124* 117*    ABG    Component Value Date/Time   PHART 7.521* 02/02/2015 0420   PCO2ART 44.0 02/02/2015 0420   PO2ART 65.0* 02/02/2015 0420   HCO3 36.1* 02/02/2015 0420   TCO2 37 02/02/2015 0420   ACIDBASEDEF TEST WILL BE CREDITED 01/20/2015 0930   O2SAT 95.0 02/02/2015 0420    CXR: PORTABLE CHEST - 1 VIEW  COMPARISON:  02/01/2015  FINDINGS: Endotracheal tube is in similar position to the prior study, likely approximately 2 cm above the carina although the carina suboptimally visualized. Right PICC is unchanged. Enteric tube courses to the left upper abdomen with tip not imaged. Sequelae of prior aortic valve replacement and left atrial appendage clipping are again identified. Cardiac silhouette remains enlarged. Veiling opacities in the lung bases do not appear significantly changed representing a combination a pleural effusions and parenchymal lung opacities. No pneumothorax is identified.  IMPRESSION: Bilateral pleural effusions and bibasilar atelectasis/consolidation without significant interval change.   Electronically Signed  By: Sebastian AcheAllen Grady  On: 02/02/2015 08:23   Assessment/Plan: S/P Procedure(s) (LRB): AORTIC VALVE REPLACEMENT (AVR) (N/A) MAZE (N/A)  AORTIC ROOT ENLARGEMENT (N/A) TRANSESOPHAGEAL ECHOCARDIOGRAM (TEE) (N/A)  RESP:  VDRF with hypoxemic respiratory failure likely due to HCAP, severe atelectasis w/ mucous plugging, and bilateral pleural effusions complicating chronic respiratory failure due to chronic diastolic CHF, morbid obesity, OSA, COPD and pulmonary hypertension.  Oxygenation and gas exchange good.  CXR unchanged - might benefit from drainage of pleural effusions (R>L) but still supratherapeutic on anticoagulation - will wait until INR decreased.  Continue vent support, nebs and antibiotics per Pulm/CCM team.  I have not seen any benefits from high dose steroids and would favor tapering steroids off  CV:  Rate-controlled atrial fibrillation w/ stable  hemodynamics.  Chronic diastolic CHF and pulmonary hypertension with normal LV systolic function, normal aortic valve function.  Continue IV amiodarone for now.  D/C dopamine.  ID:  Day #2 Cefepime and Day #1 Vanc empiric Rx for HCAP.  Afebrile but WBC up today.  Endobronchial lavage culture from 5/30 reported as  "Non-Pathogenic Oropharyngeal-type Flora".    RENAL: Acute renal insufficiency likely due to pre-renal azotemia from over-diuresis +/- ATN +/- ACE-I therapy - resolving w/ creatinine down to 1.8 this morning.   I/O's positive last 3 days.  Weight trending back up, now 3 kg above preop baseline.  Will resume lasix and monitor closely w/ goal to keep I/O's even for now.   HEME: Expected post op acute blood loss anemia, Hgb stable 9.0.  Leukocytosis worse.  FEN:  Electrolytes stable.  Mild hyponatremia and hypokalemia.  Tolerating tube feeds and was tolerating regular diet prior to re-intubation  NEURO: Intact.  Lightly sedated on vent. Comfortable.  DISP:  Anticipate full recovery but progress likely to be slow.   Purcell Nails 02/02/2015 9:33 AM

## 2015-02-02 NOTE — Progress Notes (Signed)
Patient ID: Sandra AdasRita Turner, female   DOB: 1941/08/13, 74 y.o.   MRN: [id number]  SICU Evening Rounds:  Hemodynamically stable  Remains on vent for HCAP  Urine output ok. Dopamine off. Back on lasix.  BMET    Component Value Date/Time   NA 132* 02/02/2015 0410   K 3.8 02/02/2015 0410   CL 88* 02/02/2015 0410   CO2 33* 02/02/2015 0410   GLUCOSE 140* 02/02/2015 0410   BUN 89* 02/02/2015 0410   CREATININE 1.82* 02/02/2015 0410   CALCIUM 8.8* 02/02/2015 0410   GFRNONAA 27* 02/02/2015 0410   GFRAA 31* 02/02/2015 0410    CBC    Component Value Date/Time   WBC 19.4* 02/02/2015 0410   RBC 3.07* 02/02/2015 0410   HGB 9.0* 02/02/2015 0410   HCT 27.7* 02/02/2015 0410   PLT 224 02/02/2015 0410   MCV 90.2 02/02/2015 0410   MCH 29.3 02/02/2015 0410   MCHC 32.5 02/02/2015 0410   RDW 15.1 02/02/2015 0410   LYMPHSABS 1.4 10/25/2014 1100   MONOABS 0.7 10/25/2014 1100   EOSABS 0.5 10/25/2014 1100   BASOSABS 0.0 10/25/2014 1100    A/P: stable tonight. Continue present course.

## 2015-02-02 NOTE — Progress Notes (Signed)
Physical Therapy Treatment Patient Details Name: Sandra Turner MRN: [id number] DOB: Nov 20, 1940 Today's Date: 02/02/2015    History of Present Illness Sandra Turner is a 74 year old female adm with dyspnea, severe aortic stenosis, and atrial fibrillation. 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 Intubated 01/31/15 with likely due to HCAP, severe atelectasis w/ mucous plugging    PT Comments    Pt seen for bed level exercises while on full vent support (FiO2 40%, RR 18-25, SaO2 100%). Pt required frequent rest breaks to control RR<25. If slow to wean, ? Will be appropriate for LTAC for continued therapies. Will continue to follow and progress activity as tolerated.   Follow Up Recommendations  SNF;Supervision/Assistance - 24 hour     Equipment Recommendations   (TBA)    Recommendations for Other Services       Precautions / Restrictions Precautions Precautions: Fall;Sternal Restrictions Weight Bearing Restrictions: Yes Other Position/Activity Restrictions: Sternal precautions    Mobility  Bed Mobility                  Transfers                    Ambulation/Gait                 Stairs            Wheelchair Mobility    Modified Rankin (Stroke Patients Only)       Balance                                    Cognition Arousal/Alertness: Awake/alert Behavior During Therapy: WFL for tasks assessed/performed;Anxious Overall Cognitive Status: Difficult to assess                      Exercises General Exercises - Lower Extremity Ankle Circles/Pumps: AROM;Both;10 reps;Supine (followed by bil heelcord stretches x 30 sec) Quad Sets: AROM;Both;10 reps;Supine Gluteal Sets: AROM;Both;10 reps;Supine Heel Slides: AAROM;Strengthening;Both;5 reps (assisted flexion; resisted extension) Other Exercises Other Exercises: hand pumps x 10 bil Other Exercises: hip internal rotation AROM x 5, followed by PROM  stretching    General Comments General comments (skin integrity, edema, etc.): Educated pt to perform ankle pumps, quad and glut sets, and hand pumps every hour       Pertinent Vitals/Pain Pain Assessment: No/denies pain    Home Living                      Prior Function            PT Goals (current goals can now be found in the care plan section) Acute Rehab PT Goals Patient Stated Goal: unable (intubated) PT Goal Formulation: Patient unable to participate in goal setting Time For Goal Achievement: 02/16/15 Potential to Achieve Goals: Fair Progress towards PT goals: Goals downgraded-see care plan (intubated since last session; mucous plugging)    Frequency  Min 3X/week    PT Plan Current plan remains appropriate (? will need LTACH)    Co-evaluation             End of Session Equipment Utilized During Treatment: Oxygen Activity Tolerance: Patient tolerated treatment well (on full vent support, with rest breaks when RR=25) Patient left: in bed     Time: [id number] PT Time Calculation (min) (ACUTE ONLY): 16 min  Charges:  $Therapeutic Exercise: 8-22 mins  G Codes:      Jazminn Pomales 02/02/2015, 10:18 AM Pager [phone]

## 2015-02-03 ENCOUNTER — Inpatient Hospital Stay (HOSPITAL_COMMUNITY): Payer: Medicare Other

## 2015-02-03 LAB — CBC
HEMATOCRIT: 27.9 % — AB (ref 36.0–46.0)
Hemoglobin: 8.9 g/dL — ABNORMAL LOW (ref 12.0–15.0)
MCH: 29.6 pg (ref 26.0–34.0)
MCHC: 31.9 g/dL (ref 30.0–36.0)
MCV: 92.7 fL (ref 78.0–100.0)
Platelets: 192 10*3/uL (ref 150–400)
RBC: 3.01 MIL/uL — ABNORMAL LOW (ref 3.87–5.11)
RDW: 15.6 % — AB (ref 11.5–15.5)
WBC: 15.9 10*3/uL — ABNORMAL HIGH (ref 4.0–10.5)

## 2015-02-03 LAB — GLUCOSE, CAPILLARY
GLUCOSE-CAPILLARY: 111 mg/dL — AB (ref 65–99)
GLUCOSE-CAPILLARY: 119 mg/dL — AB (ref 65–99)
Glucose-Capillary: 105 mg/dL — ABNORMAL HIGH (ref 65–99)
Glucose-Capillary: 135 mg/dL — ABNORMAL HIGH (ref 65–99)
Glucose-Capillary: 112 mg/dL — ABNORMAL HIGH (ref 65–99)
Glucose-Capillary: 139 mg/dL — ABNORMAL HIGH (ref 65–99)

## 2015-02-03 LAB — POCT I-STAT 3, ART BLOOD GAS (G3+)
ACID-BASE EXCESS: 12 mmol/L — AB (ref 0.0–2.0)
BICARBONATE: 38.5 meq/L — AB (ref 20.0–24.0)
O2 Saturation: 97 %
PCO2 ART: 57.8 mmHg — AB (ref 35.0–45.0)
PH ART: 7.429 (ref 7.350–7.450)
PO2 ART: 85 mmHg (ref 80.0–100.0)
Patient temperature: 97.6
TCO2: 40 mmol/L (ref 0–100)

## 2015-02-03 LAB — BASIC METABOLIC PANEL
ANION GAP: 11 (ref 5–15)
BUN: 84 mg/dL — ABNORMAL HIGH (ref 6–20)
CO2: 34 mmol/L — AB (ref 22–32)
Calcium: 8.7 mg/dL — ABNORMAL LOW (ref 8.9–10.3)
Chloride: 91 mmol/L — ABNORMAL LOW (ref 101–111)
Creatinine, Ser: 1.41 mg/dL — ABNORMAL HIGH (ref 0.44–1.00)
GFR, EST AFRICAN AMERICAN: 42 mL/min — AB (ref >60)
GFR, EST NON AFRICAN AMERICAN: 36 mL/min — AB (ref >60)
GLUCOSE: 130 mg/dL — AB (ref 65–99)
Potassium: 3.9 mmol/L (ref 3.5–5.1)
Sodium: 136 mmol/L (ref 135–145)

## 2015-02-03 LAB — PROTIME-INR
INR: 2.52 — ABNORMAL HIGH (ref 0.00–1.49)
Prothrombin Time: 26.8 seconds — ABNORMAL HIGH (ref 11.6–15.2)

## 2015-02-03 MED ORDER — AMIODARONE PEDIATRIC ORAL SUSPENSION 5 MG/ML
400.0000 mg | Freq: Two times a day (BID) | ORAL | Status: DC
  Filled 2015-02-03 (×2): qty 80

## 2015-02-03 MED ORDER — WARFARIN SODIUM 2.5 MG PO TABS
2.5000 mg | ORAL_TABLET | Freq: Every day | ORAL | Status: DC
Start: 2015-02-03 — End: 2015-02-05
  Administered 2015-02-03 – 2015-02-04 (×2): 2.5 mg
  Filled 2015-02-03 (×3): qty 1

## 2015-02-03 MED ORDER — POTASSIUM CHLORIDE 20 MEQ/15ML (10%) PO SOLN
20.0000 meq | Freq: Two times a day (BID) | ORAL | Status: DC
  Administered 2015-02-03 – 2015-02-06 (×8): 20 meq
  Filled 2015-02-03 (×12): qty 15

## 2015-02-03 MED ORDER — AMIODARONE HCL 200 MG PO TABS
400.0000 mg | ORAL_TABLET | Freq: Two times a day (BID) | ORAL | Status: DC
  Administered 2015-02-03 – 2015-02-06 (×8): 400 mg
  Filled 2015-02-03 (×12): qty 2

## 2015-02-03 NOTE — Progress Notes (Signed)
Physical Therapy Treatment Patient Details Name: Sandra AdasRita Aber MRN: [id number] DOB: 1941/01/16 Today's Date: 02/03/2015    History of Present Illness Sandra Turner is a 74 year old female adm with dyspnea, severe aortic stenosis, and atrial fibrillation. 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 Intubated 01/31/15 with likely due to HCAP, severe atelectasis w/ mucous plugging    PT Comments    Coordinated with RN and RT to have pt placed on rest mode (PRVC) during PT. Pt tolerated dangle EOB and stand-pivot to chair beautifully! (RR max 31 after standing 60 seconds). BP increased with activity and pt denied dizziness. Pt gesturing to ETT and wants to know when it can come out. Encouraged pt that she is making progress. Anticipate she could tolerate ambulation while on ventilator support.   Follow Up Recommendations  SNF;Supervision/Assistance - 24 hour (? will need LTAC depending on weaning from vent)     Equipment Recommendations   (TBA)    Recommendations for Other Services       Precautions / Restrictions Precautions Precautions: Fall;Sternal Restrictions Weight Bearing Restrictions: Yes Other Position/Activity Restrictions: Sternal precautions    Mobility  Bed Mobility Overal bed mobility: Needs Assistance;+2 for physical assistance;+ 2 for safety/equipment Bed Mobility: Supine to Sit     Supine to sit: Mod assist;+2 for physical assistance;+2 for safety/equipment;HOB elevated     General bed mobility comments: Due to coughing and oral ETT, raised HOB and turned to sit EOB. Pt able to move legs off EOB and assisted with abd to pull to sit; scoot to EOB with min A  Transfers Overall transfer level: Needs assistance Equipment used: None Transfers: Sit to/from [employer]GI CorporationStand;Stand Pivot Transfers Sit to Stand: Min assist;+2 safety/equipment Stand pivot transfers: Min assist;+2 safety/equipment       General transfer comment: vc for sternal precautions; +2  due to vent and 2 IV poles  Ambulation/Gait                 Stairs            Wheelchair Mobility    Modified Rankin (Stroke Patients Only)       Balance   Sitting-balance support: No upper extremity supported;Feet unsupported Sitting balance-Leahy Scale: Fair     Standing balance support: No upper extremity supported Standing balance-Leahy Scale: Fair Standing balance comment: stood x 60 seconds total while preparing lines to sit                    Cognition Arousal/Alertness: Awake/alert Behavior During Therapy: WFL for tasks assessed/performed;Anxious Overall Cognitive Status: Difficult to assess                      Exercises General Exercises - Lower Extremity Ankle Circles/Pumps: AROM;Both;20 reps;Supine;Seated Long Arc Quad: AROM;Both;10 reps;Seated Heel Slides:  (assisted flexion; resisted extension)    General Comments        Pertinent Vitals/Pain Pain Assessment: Faces Faces Pain Scale: Hurts even more Pain Location: points to chest with coughing Pain Intervention(s): Repositioned;Monitored during session;Limited activity within patient's tolerance (provided pillow to chest to splint)    Home Living                      Prior Function            PT Goals (current goals can now be found in the care plan section) Acute Rehab PT Goals Patient Stated Goal: nods "yes" to get stronger and get OOB Time For  Goal Achievement: 02/16/15 Progress towards PT goals: Progressing toward goals    Frequency  Min 3X/week    PT Plan Current plan remains appropriate (? will need LTACH )    Co-evaluation             End of Session Equipment Utilized During Treatment: Oxygen;Gait belt (vent) Activity Tolerance: Patient tolerated treatment well (on full vent support,) Patient left: in chair;with call bell/phone within reach;with SCD's reapplied     Time: 1610-9604 PT Time Calculation (min) (ACUTE ONLY): 44  min  Charges:  $Therapeutic Activity: 38-52 mins                    G Codes:      Jazzalynn Rhudy March 04, 2015, 3:39 PM Pager [phone]

## 2015-02-03 NOTE — Clinical Social Work Note (Signed)
CSW continuing to follow patient for SNF placement and discharge planning.   Ervin KnackEric R. Doreatha Offer, MSW, Theresia MajorsLCSWA [phone] 02/03/2015 5:57 PM

## 2015-02-03 NOTE — Progress Notes (Signed)
[address]       [place] [id number]             [phone]        CARDIOTHORACIC SURGERY PROGRESS NOTE   R15 Days Post-Op Procedure(s) (LRB): AORTIC VALVE REPLACEMENT (AVR) (N/A) MAZE (N/A)  AORTIC ROOT ENLARGEMENT (N/A) TRANSESOPHAGEAL ECHOCARDIOGRAM (TEE) (N/A)  Subjective: Lightly sedated on vent.  Looks comfortable and denies pain.  Objective: Vital signs: BP Readings from Last 1 Encounters:  02/03/15 138/34   Pulse Readings from Last 1 Encounters:  02/03/15 73   Resp Readings from Last 1 Encounters:  02/03/15 17   Temp Readings from Last 1 Encounters:  02/03/15 98.4 F (36.9 C) Axillary    Hemodynamics:    Physical Exam:  Rhythm:   Afib w/ controlled rate  Breath sounds: Fairly clear, diminished at bases  Heart sounds:  irregular  Incisions:  Clean and dry  Abdomen:  Soft, non-distended, non-tender, tolerating tube feeds  Extremities:  Warm, well-perfused    Intake/Output from previous day: 06/01 0701 - 06/02 0700 In: 2057.8 [I.V.:737.8; NG/GT:870; IV Piggyback:450] Out: 2145 [Urine:2145] Intake/Output this shift: Total I/O In: 123.4 [I.V.:63.4; NG/GT:60] Out: 115 [Urine:115]  Lab Results:  CBC: Recent Labs  02/02/15 0410 02/03/15 0400  WBC 19.4* 15.9*  HGB 9.0* 8.9*  HCT 27.7* 27.9*  PLT 224 192    BMET:  Recent Labs  02/02/15 0410 02/03/15 0400  NA 132* 136  K 3.8 3.9  CL 88* 91*  CO2 33* 34*  GLUCOSE 140* 130*  BUN 89* 84*  CREATININE 1.82* 1.41*  CALCIUM 8.8* 8.7*     CBG (last 3)   Recent Labs  02/02/15 2334 02/03/15 0351 02/03/15 0825  GLUCAP 126* 105* 111*    ABG    Component Value Date/Time   PHART 7.429 02/03/2015 0422   PCO2ART 57.8* 02/03/2015 0422   PO2ART 85.0 02/03/2015 0422   HCO3 38.5* 02/03/2015 0422   TCO2 40 02/03/2015 0422   ACIDBASEDEF TEST WILL BE CREDITED 01/20/2015 0930   O2SAT 97.0 02/03/2015 0422    CXR: PORTABLE CHEST - 1 VIEW  COMPARISON: February 02, 2015  FINDINGS: Endotracheal tube tip is 2.1 cm above the carina. Nasogastric tube tip and side port are below the diaphragm. Central catheter tip is in the superior vena cava. No pneumothorax. There are bilateral effusions with interstitial and patchy alveolar edema bilaterally. Heart is enlarged with pulmonary venous hypertension. No new opacity. Patient is status post aortic valve replacement. There is a left atrial appendage clamp, stable. There is atherosclerotic change in aorta.  IMPRESSION: Tube and catheter positions as described without pneumothorax. Congestive heart failure, unchanged from 1 day prior. No new opacity.   Electronically Signed  By: Bretta BangWilliam Woodruff III M.D.  On: 02/03/2015 07:58   Assessment/Plan: S/P Procedure(s) (LRB): AORTIC VALVE REPLACEMENT (AVR) (N/A) MAZE (N/A)  AORTIC ROOT ENLARGEMENT (N/A) TRANSESOPHAGEAL ECHOCARDIOGRAM (TEE) (N/A)  RESP:VDRF with hypoxemic respiratory failure likely due to HCAP, severe atelectasis w/ mucous plugging, and bilateral pleural effusions complicating chronic respiratory failure due to chronic diastolic CHF, morbid obesity, OSA, COPD and pulmonary hypertension. Oxygenation and gas exchange good. CXR unchanged - might benefit from drainage of pleural effusions (R>L) but still supratherapeutic on anticoagulation - will wait until INR decreased - anticipate likely chest tube placement tomorrow. Continue vent support, nebs and antibiotics per Pulm/CCM team.   BJ:YNWG-NFAOZHYQMVCV:Rate-controlled atrial fibrillation w/ stable hemodynamics. Chronic diastolic CHF and pulmonary hypertension with normal LV  systolic function, normal aortic valve function. Convert amiodarone to per tube.  ID:Day #3 Cefepime and Day #2 Vanc empiric Rx for HCAP. Afebrile and WBC down somewhat today. Endobronchial lavage culture from 5/30 reported as "Non-Pathogenic Oropharyngeal-type Flora".    RENAL:Acute renal insufficiency likely due to pre-renal azotemia from over-diuresis +/- ATN +/- ACE-I therapy - resolving w/ creatinine down to 1.4 this morning. I/O's balanced yesterday. Weight trending back up, now 3 kg above preop baseline. Will continue reduced dose of lasix and monitor closely w/ goal to keep I/O's even for now.  Still still has metabolic alkalosis - supplement potassium.    HEME: Expected post op acute blood loss anemia, Hgb stable 8.9. Leukocytosis slightly improved.  INR down to 2.5 today - resume coumadin at reduced dose.  ZOX:[id number] stable. Hyponatremia improved.  Mild hypokalemia. Tolerating tube feeds and was tolerating regular diet prior to re-intubation.    NEURO:Intact. Lightly sedated on vent. Comfortable.  DISP:Anticipate full recovery but progress likely to be slow.  Purcell Nails 02/03/2015 10:27 AM

## 2015-02-03 NOTE — Care Management Note (Signed)
Case Management Note  Patient Details  Name: Sandra Turner MRN: [id number] Date of Birth: Oct 14, 1940  Subjective/Objective:                    Action/Plan:   Expected Discharge Date:                  Expected Discharge Plan:  Home w Home Health Services  In-House Referral:     Discharge planning Services     Post Acute Care Choice:    Choice offered to:     DME Arranged:    DME Agency:     HH Arranged:    HH Agency:     Status of Service:  In process, will continue to follow  Medicare Important Message Given:    Date Medicare IM Given:    Medicare IM give by:    Date Additional Medicare IM Given:    Additional Medicare Important Message give by:     If discussed at Long Length of Stay Meetings, dates discussed:  02/03/15  Additional Comments:  Yvone Neurutchfield, [place] M, RN 02/03/2015, 8:59 AM

## 2015-02-03 NOTE — Progress Notes (Signed)
Little change. Not weaning Remains intubated RASS 0, + F/C Tolerating PS 14 cm H2O  Filed Vitals:   02/03/15 0900 02/03/15 1000 02/03/15 1100 02/03/15 1200  BP: 131/46 130/51 127/41 131/50  Pulse: 69 80 72 78  Temp:   98 F (36.7 C)   TempSrc:   Oral   Resp: 19 17 15 16   Height:      Weight:      SpO2: 100% 100% 99% 100%   Obese, intubated HEENT WNL Chest clear anteriorly IRIR, rate controlled, 2/6 syst M Abd soft, +BS Improved symmetric pedal edema No focal deficits  BMET    Component Value Date/Time   NA 136 02/03/2015 0400   K 3.9 02/03/2015 0400   CL 91* 02/03/2015 0400   CO2 34* 02/03/2015 0400   GLUCOSE 130* 02/03/2015 0400   BUN 84* 02/03/2015 0400   CREATININE 1.41* 02/03/2015 0400   CALCIUM 8.7* 02/03/2015 0400   GFRNONAA 36* 02/03/2015 0400   GFRAA 42* 02/03/2015 0400    CBC    Component Value Date/Time   WBC 15.9* 02/03/2015 0400   RBC 3.01* 02/03/2015 0400   HGB 8.9* 02/03/2015 0400   HCT 27.9* 02/03/2015 0400   PLT 192 02/03/2015 0400   MCV 92.7 02/03/2015 0400   MCH 29.6 02/03/2015 0400   MCHC 31.9 02/03/2015 0400   RDW 15.6* 02/03/2015 0400   LYMPHSABS 1.4 10/25/2014 1100   MONOABS 0.7 10/25/2014 1100   EOSABS 0.5 10/25/2014 1100   BASOSABS 0.0 10/25/2014 1100    CXR: NSC edema, effusions  Cefepime 5/30 >>  Vanc 5/31 >>   Resp culture 5/30 >>   IMPRESSION: S/P AVR/Maze 5/18 Recurrent hypoxic resp failure Pulm edema with effusions Possible PNA, NOS AKI, Cr improving Hypervolemia  PLAN: Cont vent support - settings reviewed and/or adjusted Wean in PSV as tolerated Cont vent bundle Daily SBT if/when meets criteria Cont current abx Check PCT 6/02 - consider DC abx if normal Cont diuresis per TCTS   Billy Fischeravid Ziair Penson, MD ; [facility] service Mobile [phone].  After 5:30 PM or weekends, call [phone]

## 2015-02-03 NOTE — Progress Notes (Signed)
      [address]       [place] [id number]             [phone]      Comfortable on ventilator  BP 116/36 mmHg  Pulse 68  Temp(Src) 98.3 F (36.8 C) (Oral)  Resp 14  Ht 5\' 2"  (1.575 m)  Wt 255 lb 1.2 oz (115.7 kg)  BMI 46.64 kg/m2  SpO2 100%   Intake/Output Summary (Last 24 hours) at 02/03/15 1925 Last data filed at 02/03/15 1900  Gross per 24 hour  Intake 1498.23 ml  Output   2690 ml  Net -1191.77 ml    Stable day, no new issues  Viviann SpareSteven C. Dorris FetchHendrickson, MD [facility] [phone]

## 2015-02-04 ENCOUNTER — Inpatient Hospital Stay (HOSPITAL_COMMUNITY): Payer: Medicare Other

## 2015-02-04 LAB — GLUCOSE, CAPILLARY
GLUCOSE-CAPILLARY: 112 mg/dL — AB (ref 65–99)
GLUCOSE-CAPILLARY: 147 mg/dL — AB (ref 65–99)
GLUCOSE-CAPILLARY: 136 mg/dL — AB (ref 65–99)
Glucose-Capillary: 117 mg/dL — ABNORMAL HIGH (ref 65–99)
Glucose-Capillary: 161 mg/dL — ABNORMAL HIGH (ref 65–99)

## 2015-02-04 LAB — POCT I-STAT 3, ART BLOOD GAS (G3+)
ACID-BASE EXCESS: 13 mmol/L — AB (ref 0.0–2.0)
Bicarbonate: 38.7 mEq/L — ABNORMAL HIGH (ref 20.0–24.0)
O2 SAT: 97 %
Patient temperature: 98.7
TCO2: 40 mmol/L (ref 0–100)
pCO2 arterial: 56.4 mmHg — ABNORMAL HIGH (ref 35.0–45.0)
pH, Arterial: 7.445 (ref 7.350–7.450)
pO2, Arterial: 96 mmHg (ref 80.0–100.0)

## 2015-02-04 LAB — BASIC METABOLIC PANEL
Anion gap: 10 (ref 5–15)
BUN: 76 mg/dL — AB (ref 6–20)
CHLORIDE: 94 mmol/L — AB (ref 101–111)
CO2: 35 mmol/L — ABNORMAL HIGH (ref 22–32)
Calcium: 8.8 mg/dL — ABNORMAL LOW (ref 8.9–10.3)
Creatinine, Ser: 1.24 mg/dL — ABNORMAL HIGH (ref 0.44–1.00)
GFR calc non Af Amer: 42 mL/min — ABNORMAL LOW (ref >60)
GFR, EST AFRICAN AMERICAN: 49 mL/min — AB (ref >60)
Glucose, Bld: 116 mg/dL — ABNORMAL HIGH (ref 65–99)
POTASSIUM: 4.2 mmol/L (ref 3.5–5.1)
SODIUM: 139 mmol/L (ref 135–145)

## 2015-02-04 LAB — PROCALCITONIN: Procalcitonin: 0.11 ng/mL

## 2015-02-04 LAB — CBC
HEMATOCRIT: 27 % — AB (ref 36.0–46.0)
HEMOGLOBIN: 8.4 g/dL — AB (ref 12.0–15.0)
MCH: 29.5 pg (ref 26.0–34.0)
MCHC: 31.1 g/dL (ref 30.0–36.0)
MCV: 94.7 fL (ref 78.0–100.0)
Platelets: 157 10*3/uL (ref 150–400)
RBC: 2.85 MIL/uL — ABNORMAL LOW (ref 3.87–5.11)
RDW: 15.5 % (ref 11.5–15.5)
WBC: 11.4 10*3/uL — ABNORMAL HIGH (ref 4.0–10.5)

## 2015-02-04 LAB — PROTIME-INR
INR: 2.12 — ABNORMAL HIGH (ref 0.00–1.49)
Prothrombin Time: 23.6 seconds — ABNORMAL HIGH (ref 11.6–15.2)

## 2015-02-04 MED ORDER — ACETYLCYSTEINE 20 % IN SOLN
3.0000 mL | Freq: Four times a day (QID) | RESPIRATORY_TRACT | Status: DC
  Administered 2015-02-04 – 2015-02-16 (×47): 3 mL via RESPIRATORY_TRACT
  Filled 2015-02-04 (×52): qty 4

## 2015-02-04 MED ORDER — MIDAZOLAM HCL 2 MG/2ML IJ SOLN
INTRAMUSCULAR | Status: AC
  Filled 2015-02-04: qty 4

## 2015-02-04 MED ORDER — LIDOCAINE HCL (PF) 1 % IJ SOLN
40.0000 mg | Freq: Once | INTRAMUSCULAR | Status: AC
  Administered 2015-02-04: 40 mg via INTRADERMAL

## 2015-02-04 MED ORDER — MIDAZOLAM HCL 2 MG/2ML IJ SOLN
4.0000 mg | Freq: Once | INTRAMUSCULAR | Status: AC
  Administered 2015-02-04: 4 mg via INTRAVENOUS

## 2015-02-04 MED ORDER — LIDOCAINE HCL (PF) 1 % IJ SOLN
INTRAMUSCULAR | Status: AC
  Filled 2015-02-04: qty 20

## 2015-02-04 NOTE — Progress Notes (Signed)
PT Cancellation Note  Patient Details Name: Sandra AdasRita Wiltsey MRN: [id number] DOB: 07/16/1941   Cancelled Treatment:    Reason Eval/Treat Not Completed: Fatigue/lethargy limiting ability to participate. Pt s/p bronchoscopy and remains too sedated to participate.   Deshanda Molitor 02/04/2015, 3:09 PM  Pager [phone]

## 2015-02-04 NOTE — Progress Notes (Addendum)
Little change. Not weaning Remains intubated RASS -1, + F/C Very obstructed on flow curves Bilateral chest tubes placed by Dr Cornelius Moraswen - 1300 cc blood tinged fluid on R, 300 cc blood tinged fluid on L FOB performed 6/03 - moderate mucoid secretions on R, copious on L  Filed Vitals:   02/04/15 1259 02/04/15 1300 02/04/15 1534 02/04/15 1612  BP:  110/36 107/24   Pulse:  69    Temp:    98.9 F (37.2 C)  TempSrc:    Axillary  Resp:  23    Height:      Weight:      SpO2: 100% 100%     Obese, intubated HEENT WNL Chest clear anteriorly IRIR, rate controlled, 2/6 syst M Abd soft, +BS Improved symmetric pedal edema No focal deficits  BMET    Component Value Date/Time   NA 139 02/04/2015 0430   K 4.2 02/04/2015 0430   CL 94* 02/04/2015 0430   CO2 35* 02/04/2015 0430   GLUCOSE 116* 02/04/2015 0430   BUN 76* 02/04/2015 0430   CREATININE 1.24* 02/04/2015 0430   CALCIUM 8.8* 02/04/2015 0430   GFRNONAA 42* 02/04/2015 0430   GFRAA 49* 02/04/2015 0430    CBC    Component Value Date/Time   WBC 11.4* 02/04/2015 0430   RBC 2.85* 02/04/2015 0430   HGB 8.4* 02/04/2015 0430   HCT 27.0* 02/04/2015 0430   PLT 157 02/04/2015 0430   MCV 94.7 02/04/2015 0430   MCH 29.5 02/04/2015 0430   MCHC 31.1 02/04/2015 0430   RDW 15.5 02/04/2015 0430   LYMPHSABS 1.4 10/25/2014 1100   MONOABS 0.7 10/25/2014 1100   EOSABS 0.5 10/25/2014 1100   BASOSABS 0.0 10/25/2014 1100    CXR: improved effusions post chest tube placement  Cefepime 5/30 >>  Vanc 5/31 >> 6/03  Resp culture 5/30 >>   IMPRESSION: S/P AVR/Maze 5/18 Recurrent hypoxic resp failure Pulm edema Pleural effusions - s/p bilat chest tubes 6/02 Mucus plugging - s/p FOB 6/03 Possible PNA, NOS. Resp cx - NOF  PCT 0.11 6/03 AKI, Cr improving Hypervolemia  PLAN: Cont vent support - settings reviewed and/or adjusted Wean in PSV as tolerated Cont vent bundle Cont nebulized steroids, bronchodilators and NAC Daily SBT if/when meets  criteria DC Vanc, cont cefepime for now Further diuresis per TCTS   Billy Fischeravid Simonds, MD ; [facility] service Mobile [phone].  After 5:30 PM or weekends, call [phone]

## 2015-02-04 NOTE — Progress Notes (Signed)
Nutrition Follow-up  DOCUMENTATION CODES:  Morbid obesity  INTERVENTION:   Recommend Vital High Protein formula at goal rate of 60 ml/hr   TF regimen to provide 1440 kcals, 126 gm protein, 1204 ml of free water  NUTRITION DIAGNOSIS:  Inadequate oral intake related to inability to eat as evidenced by NPO status, ongoing  GOAL:  Provide needs based on ASPEN/SCCM guidelines, currently unmet  MONITOR:  Vent status, TF tolerance, Weight trends, Labs, I & O's  ASSESSMENT: 74 yo Female developed progressive dyspnea/hypoxia from AECOPD and pulmonary edema/pleural effusions after AVR with MAZE on 5/18.  Patient s/p procedure 5/31: AORTIC VALVE REPLACEMENT (AVR)  MAZE PROCEDURE AORTIC ROOT ENLARGEMENT   Pt with bilateral pleural effusions.  S/p chest tube placement 6/3.  Patient is currently intubated on ventilator support MV: 7.9 L/min Temp (24hrs), Avg:98.5 F (36.9 C), Min:98.2 F (36.8 C), Max:99.1 F (37.3 C)   Osmolite 1.5 formula continues at 30 ml/hr via OGT; providing 1080 kcals, 45 gm protein, 549 ml of free water.  Tolerating well.   Height:  Ht Readings from Last 1 Encounters:  01/20/15 5\' 2"  (1.575 m)    Weight:  Wt Readings from Last 1 Encounters:  02/04/15 251 lb 15.8 oz (114.3 kg)    Ideal Body Weight:  50 kg  Wt Readings from Last 10 Encounters:  02/04/15 251 lb 15.8 oz (114.3 kg)  01/17/15 247 lb (112.038 kg)  12/27/14 231 lb (104.781 kg)  11/22/14 240 lb (108.863 kg)  10/04/14 241 lb 9.6 oz (109.589 kg)  09/11/14 240 lb 11.2 oz (109.181 kg)  08/16/14 249 lb (112.946 kg)  05/12/14 249 lb (112.946 kg)  04/26/14 236 lb (107.049 kg)  10/26/13 248 lb (112.492 kg)    BMI:  Body mass index is 46.08 kg/(m^2).  Estimated Nutritional Needs:  Kcal:  [id number]  Protein:  125-135 gm  Fluid:  per MD  Skin:  Wound (see comment) (Stage I to bilateral ears)  Diet Order:  NPO  EDUCATION NEEDS:  No education needs identified at this  time   Intake/Output Summary (Last 24 hours) at 02/04/15 1221 Last data filed at 02/04/15 1100  Gross per 24 hour  Intake   1065 ml  Output   3815 ml  Net  -2750 ml    Last BM:  5/28  Maureen ChattersKatie Devondre Guzzetta, RD, LDN Pager #: [phone] After-Hours Pager #: [phone]

## 2015-02-04 NOTE — Op Note (Signed)
CARDIOTHORACIC SURGERY OPERATIVE NOTE  Date of Procedure:  02/04/2015  Preoperative Diagnosis: Bilateral Pleural Effusions  Postoperative Diagnosis: Same  Procedure:   Bilateral chest tube placement  Surgeon:   Salvatore Decentlarence H. Cornelius Moraswen, MD  Anesthesia:   1% lidocaine local with intravenous sedation    DETAILS OF THE OPERATIVE PROCEDURE  Following full informed consent the patient was given a total of 4 mg midazolam and 100 mcg fentanyl intravenously and continuously monitored for rhythm, BP and oxygen saturation. The right chest was prepared and draped in a sterile manner. 1% lidocaine was utilized to anesthetize the skin and subcutaneous tissues. A small incision was made and a 28 French straight chest tube was placed through the incision into the right pleural space. The tube was secured to the skin and connected to a closed suction collection device. Following this the left chest was prepared and draped in a sterile manner. 1% lidocaine was utilized to anesthetize the skin and subcutaneous tissues. A small incision was made and a 28 French straight chest tube was placed through the incision into the left pleural space. The patient tolerated the procedures well.  A portable CXR was ordered. There were no complications.     Salvatore Decentlarence H. Cornelius Moraswen, MD 02/04/2015 9:50 AM

## 2015-02-04 NOTE — Progress Notes (Signed)
[address]       [place] [address]             [phone]        CARDIOTHORACIC SURGERY PROGRESS NOTE   R16 Days Post-Op Procedure(s) (LRB): AORTIC VALVE REPLACEMENT (AVR) (N/A) MAZE (N/A)  AORTIC ROOT ENLARGEMENT (N/A) TRANSESOPHAGEAL ECHOCARDIOGRAM (TEE) (N/A)  Subjective: Lightly sedated on vent  Objective: Vital signs: BP Readings from Last 1 Encounters:  02/04/15 129/41   Pulse Readings from Last 1 Encounters:  02/04/15 71   Resp Readings from Last 1 Encounters:  02/04/15 19   Temp Readings from Last 1 Encounters:  02/04/15 98.2 F (36.8 C) Axillary    Hemodynamics:    Physical Exam:  Rhythm:   Afib w/ controlled rate  Breath sounds: Fairly clear  Heart sounds:  irregular  Incisions:  Clean and dry  Abdomen:  Soft, non-distended, non-tender  Extremities:  Warm, well-perfused    Intake/Output from previous day: 06/02 0701 - 06/03 0700 In: 1188.5 [I.V.:438.5; NG/GT:750] Out: 2605 [Urine:2605] Intake/Output this shift: Total I/O In: 75 [I.V.:15; NG/GT:60] Out: 150 [Urine:150]  Lab Results:  CBC: Recent Labs  02/03/15 0400 02/04/15 0430  WBC 15.9* 11.4*  HGB 8.9* 8.4*  HCT 27.9* 27.0*  PLT 192 157    BMET:  Recent Labs  02/03/15 0400 02/04/15 0430  NA 136 139  K 3.9 4.2  CL 91* 94*  CO2 34* 35*  GLUCOSE 130* 116*  BUN 84* 76*  CREATININE 1.41* 1.24*  CALCIUM 8.7* 8.8*     CBG (last 3)   Recent Labs  02/03/15 2319 02/04/15 0350 02/04/15 0753  GLUCAP 139* 112* 147*    ABG    Component Value Date/Time   PHART 7.445 02/04/2015 0431   PCO2ART 56.4* 02/04/2015 0431   PO2ART 96.0 02/04/2015 0431   HCO3 38.7* 02/04/2015 0431   TCO2 40 02/04/2015 0431   ACIDBASEDEF TEST WILL BE CREDITED 01/20/2015 0930   O2SAT 97.0 02/04/2015 0431    CXR: n/a  Assessment/Plan: S/P Procedure(s) (LRB): AORTIC VALVE REPLACEMENT (AVR) (N/A) MAZE (N/A)  AORTIC ROOT ENLARGEMENT (N/A) TRANSESOPHAGEAL  ECHOCARDIOGRAM (TEE) (N/A)  RESP:VDRF with hypoxemic respiratory failure likely due to HCAP, severe atelectasis w/ mucous plugging, and bilateral pleural effusions complicating chronic respiratory failure due to chronic diastolic CHF, morbid obesity, OSA, COPD and pulmonary hypertension. Oxygenation and gas exchange good. Continue vent support, nebs and antibiotics per Pulm/CCM team. For chest tube placement to drain bilateral pleural effusions.  Follow up CXR after chest tubes placed.  Possible bronch per Pulm/CCM team.  BJ:[id number]-NFAOZHYQMV atrial fibrillation w/ stable hemodynamics. Chronic diastolic CHF and pulmonary hypertension with normal LV systolic function, normal aortic valve function. Continue amiodarone to per tube.  ID:Day #4 Cefepime and Day #3 Vanc empiric Rx for HCAP. Afebrile and WBC down further today. Endobronchial lavage culture from 5/30 reported as "Non-Pathogenic Oropharyngeal-type Flora".   RENAL:Acute renal insufficiency likely due to pre-renal azotemia from over-diuresis +/- ATN +/- ACE-I therapy - resolving w/ creatinine down to 1.2 this morning. I/O's negative 1416 mL yesterday. Weight down slightly, still 2 kg above preop baseline. Will continue reduced dose of lasix and monitor closely w/ goal to keep I/O's even for now. Still still has metabolic alkalosis.   HEME: Expected post op acute blood loss anemia, Hgb down slightly 8.4. Leukocytosis improved. INR down to 2.1 today - continue coumadin at reduced dose.  HQI:[id number] stable. Hyponatremia resolved. Potassium up to 4.2.  Tolerating tube  feeds.   NEURO:Intact. Lightly sedated on vent. Comfortable.  DISP:Anticipate full recovery but progress likely to be slow.  Sandra Turner 02/04/2015 8:55 AM

## 2015-02-04 NOTE — Procedures (Signed)
Indication:  High airway pressures  Procedure: After adequate sedation and anesthesia, the bronchoscope was introduced via the ETT and advanced into the trachea whereupon mucoid secretions were encountered and successfully removed. Exam of 2nd generation airways was completed revealing normal anatomical arrangement. There were moderate mucoid secretions in basilar segments on the R and copious mucoid secretions in basilar segments on the L. These were suctioned successfully. No specimens were obtained. The pt tolerated well.   IMPRESSION: High airway pressures due to mucus obstruction of multiple airways  PLAN: Cont nebulized steroids and bronchodilators. Resume nebulized NAC      Billy Fischeravid Simonds, MD;  PCCM service; Mobile [phone]

## 2015-02-04 NOTE — Progress Notes (Signed)
Fentanyl drip expired. New drip hanging. wasted in sink.

## 2015-02-04 NOTE — Progress Notes (Signed)
TCTS BRIEF SICU PROGRESS NOTE  16 Days Post-Op  S/P Procedure(s) (LRB): AORTIC VALVE REPLACEMENT (AVR) (N/A) MAZE (N/A)  AORTIC ROOT ENLARGEMENT (N/A) TRANSESOPHAGEAL ECHOCARDIOGRAM (TEE) (N/A)   Stable day Afib w/ controlled rate, stable BP O2 sats 98-100% on 40% FiO2 Diuresing adequately Results of bronchoscopy noted  Plan: Continue current plan  Sandra Turner 02/04/2015 6:24 PM

## 2015-02-05 ENCOUNTER — Inpatient Hospital Stay (HOSPITAL_COMMUNITY): Payer: Medicare Other

## 2015-02-05 DIAGNOSIS — J9809 Other diseases of bronchus, not elsewhere classified: Secondary | ICD-10-CM

## 2015-02-05 DIAGNOSIS — J398 Other specified diseases of upper respiratory tract: Secondary | ICD-10-CM | POA: Diagnosis present

## 2015-02-05 LAB — BASIC METABOLIC PANEL
ANION GAP: 10 (ref 5–15)
BUN: 73 mg/dL — AB (ref 6–20)
CALCIUM: 8.7 mg/dL — AB (ref 8.9–10.3)
CO2: 36 mmol/L — ABNORMAL HIGH (ref 22–32)
Chloride: 96 mmol/L — ABNORMAL LOW (ref 101–111)
Creatinine, Ser: 1.27 mg/dL — ABNORMAL HIGH (ref 0.44–1.00)
GFR calc Af Amer: 48 mL/min — ABNORMAL LOW (ref >60)
GFR calc non Af Amer: 41 mL/min — ABNORMAL LOW (ref >60)
GLUCOSE: 126 mg/dL — AB (ref 65–99)
Potassium: 4.6 mmol/L (ref 3.5–5.1)
Sodium: 142 mmol/L (ref 135–145)

## 2015-02-05 LAB — CBC
HCT: 24.3 % — ABNORMAL LOW (ref 36.0–46.0)
HEMOGLOBIN: 7.6 g/dL — AB (ref 12.0–15.0)
MCH: 29.5 pg (ref 26.0–34.0)
MCHC: 31.3 g/dL (ref 30.0–36.0)
MCV: 94.2 fL (ref 78.0–100.0)
Platelets: 145 10*3/uL — ABNORMAL LOW (ref 150–400)
RBC: 2.58 MIL/uL — ABNORMAL LOW (ref 3.87–5.11)
RDW: 15.4 % (ref 11.5–15.5)
WBC: 12.3 10*3/uL — ABNORMAL HIGH (ref 4.0–10.5)

## 2015-02-05 LAB — GLUCOSE, CAPILLARY
GLUCOSE-CAPILLARY: 146 mg/dL — AB (ref 65–99)
Glucose-Capillary: 108 mg/dL — ABNORMAL HIGH (ref 65–99)
Glucose-Capillary: 125 mg/dL — ABNORMAL HIGH (ref 65–99)
Glucose-Capillary: 128 mg/dL — ABNORMAL HIGH (ref 65–99)
Glucose-Capillary: 132 mg/dL — ABNORMAL HIGH (ref 65–99)
Glucose-Capillary: 141 mg/dL — ABNORMAL HIGH (ref 65–99)

## 2015-02-05 LAB — POCT I-STAT 3, ART BLOOD GAS (G3+)
Acid-Base Excess: 15 mmol/L — ABNORMAL HIGH (ref 0.0–2.0)
Bicarbonate: 39.8 mEq/L — ABNORMAL HIGH (ref 20.0–24.0)
O2 Saturation: 97 %
PH ART: 7.507 — AB (ref 7.350–7.450)
Patient temperature: 98
TCO2: 41 mmol/L (ref 0–100)
pCO2 arterial: 50.1 mmHg — ABNORMAL HIGH (ref 35.0–45.0)
pO2, Arterial: 85 mmHg (ref 80.0–100.0)

## 2015-02-05 LAB — PREPARE RBC (CROSSMATCH)

## 2015-02-05 LAB — PROTIME-INR
INR: 2.27 — ABNORMAL HIGH (ref 0.00–1.49)
Prothrombin Time: 24.8 seconds — ABNORMAL HIGH (ref 11.6–15.2)

## 2015-02-05 MED ORDER — GUAIFENESIN 100 MG/5ML PO SYRP
400.0000 mg | ORAL_SOLUTION | Freq: Four times a day (QID) | ORAL | Status: DC
  Administered 2015-02-05 – 2015-02-07 (×7): 400 mg via ORAL
  Filled 2015-02-05 (×14): qty 20

## 2015-02-05 MED ORDER — BUDESONIDE 0.25 MG/2ML IN SUSP
0.2500 mg | Freq: Four times a day (QID) | RESPIRATORY_TRACT | Status: DC
  Administered 2015-02-05 – 2015-02-21 (×60): 0.25 mg via RESPIRATORY_TRACT
  Filled 2015-02-05 (×72): qty 2

## 2015-02-05 MED ORDER — SODIUM CHLORIDE 0.9 % IV SOLN: Freq: Once | INTRAVENOUS | Status: DC

## 2015-02-05 NOTE — Progress Notes (Signed)
TCTS BRIEF SICU PROGRESS NOTE  17 Days Post-Op  S/P Procedure(s) (LRB): AORTIC VALVE REPLACEMENT (AVR) (N/A) MAZE (N/A)  AORTIC ROOT ENLARGEMENT (N/A) TRANSESOPHAGEAL ECHOCARDIOGRAM (TEE) (N/A)   Stable day  Plan: Continue current plan  Purcell NailsClarence H Owen 02/05/2015 6:32 PM

## 2015-02-05 NOTE — Progress Notes (Addendum)
[address]       [place] [id number]             [phone]        CARDIOTHORACIC SURGERY PROGRESS NOTE   R17 Days Post-Op Procedure(s) (LRB): AORTIC VALVE REPLACEMENT (AVR) (N/A) MAZE (N/A)  AORTIC ROOT ENLARGEMENT (N/A) TRANSESOPHAGEAL ECHOCARDIOGRAM (TEE) (N/A)  Subjective: Lightly sedated on vent.  Looks comfortable and denies pain.  Airway secretions reportedly "scant"  Objective: Vital signs: BP Readings from Last 1 Encounters:  02/05/15 111/34   Pulse Readings from Last 1 Encounters:  02/05/15 75   Resp Readings from Last 1 Encounters:  02/05/15 22   Temp Readings from Last 1 Encounters:  02/05/15 98.4 F (36.9 C) Axillary    Hemodynamics:    Physical Exam:  Rhythm:   Afib w/ controlled rate  Breath sounds: Fairly clear  Heart sounds:  irregular  Incisions:  Clean and dry  Abdomen:  Soft, non-distended, non-tender, tolerating tube feeds  Extremities:  Warm, well-perfused  Chest tubes:  Low volume thin serosanguinous output, no air leak    Intake/Output from previous day: 06/03 0701 - 06/04 0700 In: 1767.5 [I.V.:297.5; NG/GT:870; IV Piggyback:600] Out: 4000 [Urine:1860; Chest Tube:2140] Intake/Output this shift: Total I/O In: 112.5 [I.V.:22.5; NG/GT:90] Out: 220 [Urine:150; Chest Tube:70]  Lab Results:  CBC: Recent Labs  02/04/15 0430 02/05/15 0448  WBC 11.4* 12.3*  HGB 8.4* 7.6*  HCT 27.0* 24.3*  PLT 157 145*    BMET:  Recent Labs  02/04/15 0430 02/05/15 0448  NA 139 142  K 4.2 4.6  CL 94* 96*  CO2 35* 36*  GLUCOSE 116* 126*  BUN 76* 73*  CREATININE 1.24* 1.27*  CALCIUM 8.8* 8.7*     CBG (last 3)   Recent Labs  02/04/15 2357 02/05/15 0406 02/05/15 0809  GLUCAP 141* 125* 132*    ABG    Component Value Date/Time   PHART 7.507* 02/05/2015 0442   PCO2ART 50.1* 02/05/2015 0442   PO2ART 85.0 02/05/2015 0442   HCO3 39.8* 02/05/2015 0442   TCO2 41 02/05/2015 0442   ACIDBASEDEF TEST WILL BE  CREDITED 01/20/2015 0930   O2SAT 97.0 02/05/2015 0442    CXR: PORTABLE CHEST - 1 VIEW  COMPARISON: 02/04/2015  FINDINGS: Changes cardiac surgery and valve replacement are stable. Cardiopericardial silhouette is moderately enlarged.  Hazy airspace opacity has developed along the tract of the right-sided chest tube. This is likely atelectasis. Position of the bilateral chest tubes is stable. There is no convincing pneumothorax on this semi-erect study.  Left lung base opacity is stable consistent with atelectasis. Probable small pleural effusions. No overt pulmonary edema.  Endotracheal tube and nasogastric tube are stable and well positioned. Right PICC is stable with its tip in the lower superior vena cava.  IMPRESSION: 1. Hazy opacity has developed adjacent to the right sided chest tube since the prior study, most likely atelectasis. 2. No other change. No convincing pneumothorax. No overt edema. Persistent left lung base atelectasis. Probable small effusions.   Electronically Signed  By: Amie Portlandavid Ormond M.D.  On: 02/05/2015 07:44   Assessment/Plan: S/P Procedure(s) (LRB): AORTIC VALVE REPLACEMENT (AVR) (N/A) MAZE (N/A)  AORTIC ROOT ENLARGEMENT (N/A) TRANSESOPHAGEAL ECHOCARDIOGRAM (TEE) (N/A)  RESP:VDRF with hypoxemic respiratory failure likely due to HCAP, severe atelectasis w/ mucous plugging, and bilateral pleural effusions complicating chronic respiratory failure due to chronic diastolic CHF, morbid obesity, OSA, COPD and pulmonary hypertension. Oxygenation and gas exchange good. CXR improved after chest  tube placement although slight increase hazy appearance right side.  Continue vent support, nebs and antibiotics per Pulm/CCM team. Might ultimately require tracheostomy.  WJ:[id number]-[id number] atrial fibrillation w/ stable hemodynamics. Chronic diastolic CHF and pulmonary hypertension with normal LV systolic function, normal  aortic valve function. Continue amiodarone to per tube.  ID:Day #5 Cefepime empiric Rx for HCAP - Vancomycin stopped. Afebrile and WBC up slightly 12.4 today. Endobronchial lavage culture from 5/30 reported as "Non-Pathogenic Oropharyngeal-type Flora".   RENAL:Acute renal insufficiency likely due to pre-renal azotemia from over-diuresis +/- ATN +/- ACE-I therapy - resolving w/ creatinine stable at 1.2 this morning. I/O's negative 2232 mL yesterday although that includes chest tube output. Weight down slightly, close to preop baseline. Will continue reduced dose of lasix and monitor closely w/ goal to keep I/O's even for now. Still still has metabolic alkalosis.   HEME: Expected post op acute blood loss anemia, Hgb down further 7.6 today - potentially related to chest tube placement.  Will transfuse 2 units PRBC's today. Leukocytosis. INR up to 2.3 today - will hold coumadin in anticipation of likely need for tracheostomy  QMV:[id number] stable. Hyponatremia resolved. Potassium up to 4.6. Tolerating tube feeds.   NEURO:Intact. Lightly sedated on vent. Comfortable.  DISP:Anticipate full recovery but progress likely to be slow.  Purcell Nails 02/05/2015 9:44 AM

## 2015-02-05 NOTE — Progress Notes (Signed)
[facility] PCCM Progress Note  S: Bronched yesterday, bilateral chest tube placement, less anxious this AM, still requiring relatively high vent support  O: Filed Vitals:   02/05/15 0400 02/05/15 0500 02/05/15 0600 02/05/15 0700  BP: 99/36 98/32 110/38 101/29  Pulse: 73 70 73 68  Temp: 97.9 F (36.6 C)     TempSrc: Oral     Resp: 22 20 24 15   Height:      Weight:  111.3 kg (245 lb 6 oz)    SpO2: 99% 100% 100% 100%   Vent Mode:  [-] PRVC FiO2 (%):  [40 %] 40 % Set Rate:  [12 bmp] 12 bmp Vt Set:  [440 mL] 440 mL PEEP:  [5 cmH20] 5 cmH20 Plateau Pressure:  [9 cmH20-29 cmH20] 9 cmH20  Gen: on vent, no distress HENT: NCAT, ETT in place PULM: rhonchi bilaterally, diminished breath sounds on left with decreased air movement CV: RRR with frequent interruptions, GI: BS+, soft, nontender Derm: bruising left hand, hand warm with normal cap refill Neuro: follows commands, wakes easily  CXR: bilateral chest tubes, ETT in place, interstial edema improved, pleural effusions improved  Principal Problem:   Acute respiratory failure with hypoxia Active Problems:   Atrial fibrillation   Obesity   Critical aortic valve stenosis   Pulmonary hypertension   COPD (chronic obstructive pulmonary disease)   Severe aortic stenosis   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  On pressure support she is not moving much air this morning.  I reviewed the images from a 2013 CT chest which showed findings worrisome for an typical pulmonary infection (MAI), but the most recent CT chest mostly showed large effusions and severe trachobronchomalacia.  Given the severity  of her underlying airways disease I suspect she will need a tracheostomy.    Alkalosis noted on ABG, mostly metabolic but related to declining pCO2 as well  Plan: Adjust tidal volume to 7cc/kg Add high dose guaifenesin Add chest PT q 4h RT to bag lavage q shift Continue mucomyst, nebulized budesonide Daily CXR PSV as long as tolerated today If no improvement in lung exam/air movement will bronch again tomorrow Cefepime stop date set for tomorrow  My cc time 40 minutes  Heber CarolinaBrent McQuaid, MD [place] P[facility]M Pager: [phone] Cell: [phone] After 3pm or if no response, call [phone]

## 2015-02-06 ENCOUNTER — Inpatient Hospital Stay (HOSPITAL_COMMUNITY): Payer: Medicare Other

## 2015-02-06 LAB — TYPE AND SCREEN
ABO/RH(D): A NEG
Antibody Screen: NEGATIVE
UNIT DIVISION: 0
UNIT DIVISION: 0

## 2015-02-06 LAB — CBC
HCT: 30.2 % — ABNORMAL LOW (ref 36.0–46.0)
Hemoglobin: 9.5 g/dL — ABNORMAL LOW (ref 12.0–15.0)
MCH: 29.3 pg (ref 26.0–34.0)
MCHC: 31.5 g/dL (ref 30.0–36.0)
MCV: 93.2 fL (ref 78.0–100.0)
Platelets: 148 10*3/uL — ABNORMAL LOW (ref 150–400)
RBC: 3.24 MIL/uL — AB (ref 3.87–5.11)
RDW: 16.2 % — ABNORMAL HIGH (ref 11.5–15.5)
WBC: 12.4 10*3/uL — ABNORMAL HIGH (ref 4.0–10.5)

## 2015-02-06 LAB — BASIC METABOLIC PANEL
ANION GAP: 9 (ref 5–15)
BUN: 64 mg/dL — ABNORMAL HIGH (ref 6–20)
CALCIUM: 8.7 mg/dL — AB (ref 8.9–10.3)
CO2: 40 mmol/L — ABNORMAL HIGH (ref 22–32)
Chloride: 95 mmol/L — ABNORMAL LOW (ref 101–111)
Creatinine, Ser: 1.24 mg/dL — ABNORMAL HIGH (ref 0.44–1.00)
GFR calc Af Amer: 49 mL/min — ABNORMAL LOW (ref >60)
GFR, EST NON AFRICAN AMERICAN: 42 mL/min — AB (ref >60)
Glucose, Bld: 124 mg/dL — ABNORMAL HIGH (ref 65–99)
Potassium: 4.4 mmol/L (ref 3.5–5.1)
Sodium: 144 mmol/L (ref 135–145)

## 2015-02-06 LAB — GLUCOSE, CAPILLARY
GLUCOSE-CAPILLARY: 139 mg/dL — AB (ref 65–99)
Glucose-Capillary: 121 mg/dL — ABNORMAL HIGH (ref 65–99)
Glucose-Capillary: 109 mg/dL — ABNORMAL HIGH (ref 65–99)
Glucose-Capillary: 125 mg/dL — ABNORMAL HIGH (ref 65–99)
Glucose-Capillary: 125 mg/dL — ABNORMAL HIGH (ref 65–99)
Glucose-Capillary: 160 mg/dL — ABNORMAL HIGH (ref 65–99)

## 2015-02-06 LAB — PROTIME-INR
INR: 1.9 — ABNORMAL HIGH (ref 0.00–1.49)
Prothrombin Time: 21.7 seconds — ABNORMAL HIGH (ref 11.6–15.2)

## 2015-02-06 MED ORDER — METHYLPREDNISOLONE SODIUM SUCC 40 MG IJ SOLR
40.0000 mg | Freq: Every day | INTRAMUSCULAR | Status: DC
  Administered 2015-02-06 – 2015-02-13 (×7): 40 mg via INTRAVENOUS
  Filled 2015-02-06 (×9): qty 1

## 2015-02-06 MED ORDER — MIDAZOLAM HCL 2 MG/2ML IJ SOLN
2.0000 mg | Freq: Once | INTRAMUSCULAR | Status: AC
  Administered 2015-02-06: 2 mg via INTRAVENOUS

## 2015-02-06 NOTE — Op Note (Signed)
PCCM Bronchoscopy Procedure Note  The patient was informed of the risks (including but not limited to bleeding, infection, respiratory failure, lung injury, tooth/oral injury) and benefits of the procedure and gave consent, see chart.  Indication: clear endobronchial secretions  Location: 2 South room 11  Condition pre procedure: Critically ill on vent  Medications for procedure: 2mg  versed, 50mcg fentanyl bolus + fentanyl gtt  Procedure description: The bronchoscope was introduced through the endotracheal tube and passed to the bilateral lungs to the level of the subsegmental bronchi throughout the tracheobronchial tree.  Airway exam revealed thick, tenacious, glue-like brown secretions mostly in the trachea primarily.  With saline washing and repeated suctioning these were clear.  The rest of the airway exam was significant for tracheomalacia which did not completely obstruct the airway and beefy red acute inflammation.  Procedures performed: therapeutic aspiration of the airways  Specimens sent: tracheal wash for fungal and bacterial culture  Condition post procedure: stable on vent  EBL: None  Complications: none  Post procedure diagnosis: tracheomalacia, acute inflammation of the airways, question fungal airways disease  Heber CarolinaBrent McQuaid, MD [place] P[facility]M Pager: [phone] Cell: [phone] After 3pm or if no response, call [phone]

## 2015-02-06 NOTE — Progress Notes (Signed)
[address]       [place] [id number]             [phone]        CARDIOTHORACIC SURGERY PROGRESS NOTE   R18 Days Post-Op Procedure(s) (LRB): AORTIC VALVE REPLACEMENT (AVR) (N/A) MAZE (N/A)  AORTIC ROOT ENLARGEMENT (N/A) TRANSESOPHAGEAL ECHOCARDIOGRAM (TEE) (N/A)  Subjective: Lightly sedated on vent.  Comfortable.  Denies pain.  Objective: Vital signs: BP Readings from Last 1 Encounters:  02/06/15 100/37   Pulse Readings from Last 1 Encounters:  02/06/15 68   Resp Readings from Last 1 Encounters:  02/06/15 29   Temp Readings from Last 1 Encounters:  02/06/15 98.4 F (36.9 C) Axillary    Hemodynamics:    Physical Exam:  Rhythm:   Afib w/ controlled rate  Breath sounds: Fairly clear  Heart sounds:  irregular  Incisions:  Clean and dry  Abdomen:  Soft, non-distended, non-tender, tolerating tube feeds  Extremities:  Warm, well-perfused  Chest tubes:  Chest tubes still draining serosanguinous output R>L, no air leak    Intake/Output from previous day: 06/04 0701 - 06/05 0700 In: 2028.1 [I.V.:298.1; Blood:670; NG/GT:1010; IV Piggyback:50] Out: 3670 [Urine:2720; Chest Tube:950] Intake/Output this shift: Total I/O In: 85 [I.V.:25; NG/GT:60] Out: 265 [Urine:225; Chest Tube:40]  Lab Results:  CBC: Recent Labs  02/05/15 0448 02/06/15 0340  WBC 12.3* 12.4*  HGB 7.6* 9.5*  HCT 24.3* 30.2*  PLT 145* 148*    BMET:  Recent Labs  02/05/15 0448 02/06/15 0340  NA 142 144  K 4.6 4.4  CL 96* 95*  CO2 36* 40*  GLUCOSE 126* 124*  BUN 73* 64*  CREATININE 1.27* 1.24*  CALCIUM 8.7* 8.7*     PT/INR:   Recent Labs  02/06/15 0340  LABPROT 21.7*  INR 1.90*    CBG (last 3)   Recent Labs  02/06/15 0009 02/06/15 0335 02/06/15 0759  GLUCAP 139* 121* 109*    ABG    Component Value Date/Time   PHART 7.507* 02/05/2015 0442   PCO2ART 50.1* 02/05/2015 0442   PO2ART 85.0 02/05/2015 0442   HCO3 39.8* 02/05/2015 0442   TCO2  41 02/05/2015 0442   ACIDBASEDEF TEST WILL BE CREDITED 01/20/2015 0930   O2SAT 97.0 02/05/2015 0442    CXR: Looks remarkably clear  Assessment/Plan: S/P Procedure(s) (LRB): AORTIC VALVE REPLACEMENT (AVR) (N/A) MAZE (N/A)  AORTIC ROOT ENLARGEMENT (N/A) TRANSESOPHAGEAL ECHOCARDIOGRAM (TEE) (N/A)  RESP:VDRF with hypoxemic respiratory failure likely due to HCAP, severe atelectasis w/ mucous plugging, and bilateral pleural effusions complicating 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 Oxygenation and gas exchange good. CXR looks remarkably clear. Repeat bronch results noted. Continue vent support, nebs and antibiotics per Pulm/CCM team. May require tracheostomy - will need to decide whether to try acute wean/extubation or proceed directly to tracheostomy.  Steroids restarted today - reasons unclear.  BJ:[id number]-[id number]:Rate-controlled atrial fibrillation w/ stable hemodynamics. Chronic diastolic CHF and pulmonary hypertension with normal LV systolic function, normal aortic valve function. Continue amiodarone to per tube.  ID:Day #6 Cefepime empiric Rx for HCAP - Vancomycin stopped. Afebrile and WBC stable 12.4 today. Endobronchial lavage culture from 5/30 reported as "Non-Pathogenic Oropharyngeal-type Flora". Repeat BAL pending.  RENAL:Acute renal insufficiency likely due to pre-renal azotemia from over-diuresis +/- ATN +/- ACE-I therapy - resolving w/ creatinine stable at 1.2 this morning. I/O's negative 1642 mL yesterday although that includes chest tube output. Weight down slightly, close  to preop baseline. Will continue reduced dose of lasix and monitor closely. Still still has metabolic alkalosis.   HEME: Expected post op acute blood loss anemia, Hgb up to 9.5 today following transfusion. Leukocytosis stable. INR down to 1.9 today - will continue  to hold coumadin in anticipation of likely need for tracheostomy  WUJ:WJXBJYNWGNFA stable. Sodium creeping up to 144. Potassium stable 4.4. Tolerating tube feeds. Add free water.   NEURO:Intact. Lightly sedated on vent. Comfortable.  DISP:Anticipate full recovery but progress likely to be slow.  Purcell Nails 02/06/2015 10:12 AM

## 2015-02-06 NOTE — Progress Notes (Signed)
T[facility] BRIEF SICU PROGRESS NOTE  18 Days Post-Op  S/P Procedure(s) (LRB): AORTIC VALVE REPLACEMENT (AVR) (N/A) MAZE (N/A)  AORTIC ROOT ENLARGEMENT (N/A) TRANSESOPHAGEAL ECHOCARDIOGRAM (TEE) (N/A)   Stable day  Plan: Continue current plan  Purcell NailsClarence H Mccormick Macon 02/06/2015 5:56 PM

## 2015-02-06 NOTE — Progress Notes (Addendum)
[facility] PCCM Progress Note  S: Was on PSV most of the day yesterday, bronched again this morning  O: Filed Vitals:   02/06/15 0300 02/06/15 0400 02/06/15 0500 02/06/15 0600  BP: 107/33 112/41 119/38 111/29  Pulse: 66 60 69 65  Temp:  98.8 F (37.1 C)    TempSrc:  Oral    Resp: 23 23 21 16   Height:      Weight:   110.1 kg (242 lb 11.6 oz)   SpO2: 99% 100% 97% 100%   Vent Mode:  [-] PRVC FiO2 (%):  [40 %] 40 % Set Rate:  [12 bmp] 12 bmp Vt Set:  [440 mL] 440 mL PEEP:  [5 cmH20] 5 cmH20 Pressure Support:  [10 cmH20-12 cmH20] 10 cmH20 Plateau Pressure:  [20 cmH20] 20 cmH20  Gen: on vent, no distress HENT: NCAT, ETT in place PULM: loud wheezing bilaterally, poor air movement on left CV: RRR, no murmur GI: BS+, soft, nontender Derm: left hand bruising better, hand warm with normal cap refill Neuro: follows commands, wakes easily  6/5 CXR images: bilateral chest tubes, ETT in place, interstial edema improved again today, pleural effusions improved  Principal Problem:   Acute respiratory failure with hypoxia Active Problems:   Atrial fibrillation   Obesity   Critical aortic valve stenosis   Pulmonary hypertension   COPD (chronic obstructive pulmonary disease)   Severe aortic stenosis   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  10/2014 Serum eosinophil count notably elevated at 500cell/microliter. Bronch today with significant tracheomalacia and acute inflammation and thick, glue like secretions.  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    Plan: Continue pulmonary toilette measures again today (mucomyst x24 hours, guaifenesin, chest PT, bag lavage) PSV today as tolerated Consider attempting Extubation in next 24-48 hours Add solumedrol to nebulized steroid Daily CXR Stop cefepime today Check serum IgE Send tracheal wash for fungal culture and bacterial culture  I updated her cousin Gerri SporeWesley vanMeter by phone this morning  My cc time 35 minutes  Heber CarolinaBrent Librado Guandique, MD [place] P[facility]M Pager: [phone] Cell: [phone] After 3pm or if no response, call [phone]

## 2015-02-07 ENCOUNTER — Inpatient Hospital Stay (HOSPITAL_COMMUNITY): Payer: Medicare Other

## 2015-02-07 LAB — CBC WITH DIFFERENTIAL/PLATELET
BASOS PCT: 0 % (ref 0–1)
Basophils Absolute: 0 10*3/uL (ref 0.0–0.1)
Eosinophils Absolute: 0 10*3/uL (ref 0.0–0.7)
Eosinophils Relative: 0 % (ref 0–5)
HEMATOCRIT: 30.7 % — AB (ref 36.0–46.0)
Hemoglobin: 9.5 g/dL — ABNORMAL LOW (ref 12.0–15.0)
Lymphocytes Relative: 5 % — ABNORMAL LOW (ref 12–46)
Lymphs Abs: 0.5 10*3/uL — ABNORMAL LOW (ref 0.7–4.0)
MCH: 29.5 pg (ref 26.0–34.0)
MCHC: 30.9 g/dL (ref 30.0–36.0)
MCV: 95.3 fL (ref 78.0–100.0)
MONOS PCT: 7 % (ref 3–12)
Monocytes Absolute: 0.8 10*3/uL (ref 0.1–1.0)
Neutro Abs: 10.3 10*3/uL — ABNORMAL HIGH (ref 1.7–7.7)
Neutrophils Relative %: 88 % — ABNORMAL HIGH (ref 43–77)
Platelets: 156 10*3/uL (ref 150–400)
RBC: 3.22 MIL/uL — AB (ref 3.87–5.11)
RDW: 15.4 % (ref 11.5–15.5)
WBC: 11.6 10*3/uL — ABNORMAL HIGH (ref 4.0–10.5)

## 2015-02-07 LAB — GLUCOSE, CAPILLARY
GLUCOSE-CAPILLARY: 107 mg/dL — AB (ref 65–99)
GLUCOSE-CAPILLARY: 116 mg/dL — AB (ref 65–99)
GLUCOSE-CAPILLARY: 117 mg/dL — AB (ref 65–99)
GLUCOSE-CAPILLARY: 97 mg/dL (ref 65–99)
Glucose-Capillary: 105 mg/dL — ABNORMAL HIGH (ref 65–99)
Glucose-Capillary: 178 mg/dL — ABNORMAL HIGH (ref 65–99)

## 2015-02-07 LAB — COMPREHENSIVE METABOLIC PANEL
ALK PHOS: 106 U/L (ref 38–126)
ALT: 48 U/L (ref 14–54)
AST: 55 U/L — ABNORMAL HIGH (ref 15–41)
Albumin: 2.5 g/dL — ABNORMAL LOW (ref 3.5–5.0)
Anion gap: 9 (ref 5–15)
BUN: 57 mg/dL — ABNORMAL HIGH (ref 6–20)
CHLORIDE: 97 mmol/L — AB (ref 101–111)
CO2: 40 mmol/L — AB (ref 22–32)
CREATININE: 1.23 mg/dL — AB (ref 0.44–1.00)
Calcium: 8.9 mg/dL (ref 8.9–10.3)
GFR calc Af Amer: 50 mL/min — ABNORMAL LOW (ref >60)
GFR, EST NON AFRICAN AMERICAN: 43 mL/min — AB (ref >60)
Glucose, Bld: 123 mg/dL — ABNORMAL HIGH (ref 65–99)
Potassium: 4.3 mmol/L (ref 3.5–5.1)
Sodium: 146 mmol/L — ABNORMAL HIGH (ref 135–145)
TOTAL PROTEIN: 5.6 g/dL — AB (ref 6.5–8.1)
Total Bilirubin: 1 mg/dL (ref 0.3–1.2)

## 2015-02-07 LAB — PREALBUMIN: PREALBUMIN: 15.2 mg/dL — AB (ref 18–38)

## 2015-02-07 LAB — PROTIME-INR
INR: 1.67 — ABNORMAL HIGH (ref 0.00–1.49)
Prothrombin Time: 19.7 seconds — ABNORMAL HIGH (ref 11.6–15.2)

## 2015-02-07 LAB — BRAIN NATRIURETIC PEPTIDE: B Natriuretic Peptide: 204.8 pg/mL — ABNORMAL HIGH (ref 0.0–100.0)

## 2015-02-07 LAB — IGE: IgE (Immunoglobulin E), Serum: 36 IU/mL (ref 0–100)

## 2015-02-07 MED ORDER — CHLORHEXIDINE GLUCONATE 0.12 % MT SOLN
15.0000 mL | Freq: Two times a day (BID) | OROMUCOSAL | Status: DC
  Administered 2015-02-07 – 2015-02-21 (×10): 15 mL via OROMUCOSAL
  Filled 2015-02-07 (×27): qty 15

## 2015-02-07 MED ORDER — FUROSEMIDE 10 MG/ML IJ SOLN
40.0000 mg | Freq: Every day | INTRAMUSCULAR | Status: DC
Start: 2015-02-07 — End: 2015-02-08
  Filled 2015-02-07: qty 4

## 2015-02-07 MED ORDER — CETYLPYRIDINIUM CHLORIDE 0.05 % MT LIQD
7.0000 mL | Freq: Two times a day (BID) | OROMUCOSAL | Status: DC
  Administered 2015-02-07 – 2015-02-17 (×10): 7 mL via OROMUCOSAL

## 2015-02-07 MED ORDER — ALBUTEROL SULFATE (2.5 MG/3ML) 0.083% IN NEBU
2.5000 mg | INHALATION_SOLUTION | Freq: Four times a day (QID) | RESPIRATORY_TRACT | Status: DC
  Administered 2015-02-08 – 2015-02-21 (×51): 2.5 mg via RESPIRATORY_TRACT
  Filled 2015-02-07 (×52): qty 3

## 2015-02-07 MED ORDER — ENOXAPARIN SODIUM 30 MG/0.3ML ~~LOC~~ SOLN
30.0000 mg | SUBCUTANEOUS | Status: DC
  Administered 2015-02-07 – 2015-02-08 (×2): 30 mg via SUBCUTANEOUS
  Filled 2015-02-07 (×3): qty 0.3

## 2015-02-07 MED ORDER — FREE WATER
200.0000 mL | Freq: Three times a day (TID) | Status: DC
  Administered 2015-02-07: 200 mL

## 2015-02-07 NOTE — Plan of Care (Signed)
Problem: Phase II Progression Outcomes Goal: Time pt extubated/weaned off vent Outcome: Completed/Met Date Met:  02/07/15 Extubated at 1131

## 2015-02-07 NOTE — Progress Notes (Signed)
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  10/2014 Serum eosinophil count notably elevated at 500cell/microliter. Bronch today with significant tracheomalacia and acute inflammation and thick, glue like secretions.  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 EOS checked - normal. IgE - pending. S/p bronch. STARted on IV steroids     SUBJECTIVE/OVERNIGHT/INTERVAL HX 02/07/15: HX as above from Dr Kendrick FriesMcQuaid over phone. Reviewe of 2013 CT Chest - ? ABPA v HP.  Wil send HP Panel Currently doing SBT - Dr Cornelius Moraswen feels patient is looking as good as shehas gotten so far. Patient feels she is ready for extubation   O: Filed Vitals:   02/07/15 0700 02/07/15 0800 02/07/15 0830 02/07/15 0844  BP: 113/39 116/39 116/39   Pulse: 68 64 73   Temp: 97.5 F (36.4 C)     TempSrc: Oral     Resp: 21 25 21    Height:      Weight:      SpO2: 100% 100% 100% 100%       Vent Mode:  [-] PSV FiO2 (%):  [40 %] 40 % Set Rate:  [12 bmp] 12 bmp Vt Set:  [440 mL] 440 mL PEEP:  [5 cmH20] 5 cmH20 Pressure Support:  [12 cmH20] 12 cmH20 Plateau Pressure:  [20 cmH20-26 cmH20] 20 cmH20    Gen: on vent, no distress HENT: NCAT, ETT in place PULM: no wheezing. Doing SBt. Sync with vent CV: RRR, no murmur GI: BS+, soft, nontender Derm: left hand bruising better, hand warm with normal cap refill Neuro: follows commands, wakes easily     PULMONARY  Recent Labs Lab 02/01/15 0516 02/02/15 0420 02/03/15 0422 02/04/15 0431 02/05/15 0442  PHART 7.479* 7.521* 7.429 7.445 7.507*  PCO2ART 48.9* 44.0 57.8* 56.4* 50.1*  PO2ART 68.0* 65.0* 85.0 96.0 85.0  HCO3 36.4* 36.1* 38.5* 38.7* 39.8*  TCO2 38 37 40 40 41  O2SAT 95.0 95.0 97.0 97.0 97.0    CBC  Recent Labs Lab 02/05/15 0448 02/06/15 0340 02/07/15 0519  HGB 7.6* 9.5* 9.5*  HCT 24.3* 30.2* 30.7*  WBC 12.3* 12.4*  11.6*  PLT 145* 148* 156    COAGULATION  Recent Labs Lab 02/03/15 0400 02/04/15 0430 02/05/15 0448 02/06/15 0340 02/07/15 0519  INR 2.52* 2.12* 2.27* 1.90* 1.67*    CARDIAC  No results for input(s): TROPONINI in the last 168 hours. No results for input(s): PROBNP in the last 168 hours.   CHEMISTRY  Recent Labs Lab 02/03/15 0400 02/04/15 0430 02/05/15 0448 02/06/15 0340 02/07/15 0519  NA 136 139 142 144 146*  K 3.9 4.2 4.6 4.4 4.3  CL 91* 94* 96* 95* 97*  CO2 34* 35* 36* 40* 40*  GLUCOSE 130* 116* 126* 124* 123*  BUN 84* 76* 73* 64* 57*  CREATININE 1.41* 1.24* 1.27* 1.24* 1.23*  CALCIUM 8.7* 8.8* 8.7* 8.7* 8.9   Estimated Creatinine Clearance: 47.5 mL/min (by C-G formula based on Cr of 1.23).   LIVER  Recent Labs Lab 02/03/15 0400 02/04/15 0430 02/05/15 0448 02/06/15 0340 02/07/15 0519  AST  --   --   --   --  55*  ALT  --   --   --   --  48  ALKPHOS  --   --   --   --  106  BILITOT  --   --   --   --  1.0  PROT  --   --   --   --  5.6*  ALBUMIN  --   --   --   --  2.5*  INR 2.52* 2.12* 2.27* 1.90* 1.67*     INFECTIOUS  Recent Labs Lab 02/04/15 0430  PROCALCITON 0.11     ENDOCRINE CBG (last 3)   Recent Labs  02/07/15 0011 02/07/15 0404 02/07/15 0813  GLUCAP 178* 107* 116*         IMAGING x48h  - image(s) personally visualized  -   highlighted in bold Dg Chest Port 1 View  02/07/2015   CLINICAL DATA:  Respiratory failure  EXAM: PORTABLE CHEST - 1 VIEW  COMPARISON:  02/05/2015 and 02/06/2015.  FINDINGS: 0632 hr. Endotracheal tube, nasogastric tube, right arm PICC and bilateral chest tubes are unchanged in position. The side hole for the left chest tube is near the chest wall. There is stable cardiomegaly status post median sternotomy. Small left pleural effusion and mild left basilar atelectasis persist. There is interval improved aeration of the right lung base. No pneumothorax.  IMPRESSION: Interval improved aeration of the lung  bases with mild left lower lobe atelectasis and small left pleural effusion. Unchanged support system.   Electronically Signed   By: Carey Bullocks M.D.   On: 02/07/2015 07:42   Dg Chest Port 1 View  02/06/2015   CLINICAL DATA:  Acute respiratory failure. On ventilator. Hypoxia. Postop from aortic valve replacement.  EXAM: PORTABLE CHEST - 1 VIEW  COMPARISON:  02/05/2015  FINDINGS: Support lines and tubes in appropriate position. No pneumothorax visualized. Cardiomegaly and pulmonary vascular congestion are stable. Decreased airspace opacity is seen in the right midlung surrounding the right chest tube. There is persistent opacity in the left retrocardiac lung base, which shows no significant change.  IMPRESSION: Decreased opacity in the right midlung surrounding right chest tube.  No significant change in left lower lobe opacity. No pneumothorax visualized.   Electronically Signed   By: Myles Rosenthal M.D.   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 Significantly better after steroids and bronch yesterday. She might hae ABPA  V HP due to mold exposure (2013 CT chest). Eos currently normal  Plan: Extubate 02/07/2015; if fails then trach Check HP panel Await IgE Continue pulmonary toilette measures again today (mucomyst x24 hours, guaifenesin, chest PT, bag lavage) Continue solumedrol to nebulized steroid Monitor  off abx Await  tracheal wash for fungal culture and bacterial culture 02/06/15   Family 02/06/15: PCCM MD  dated her cousin Gerri Spore vanMeter by phone    D/w Dr Cornelius Moras and Dr Kendrick Fries over phone from bedside.    Dr. Kalman Shan, M.D., [facility].C.P Pulmonary and Critical Care Medicine Staff Physician [facility] Pager: [phone], If no answer or between  15:00h - 7:00h: call [phone]  02/07/2015  9:47 AM

## 2015-02-07 NOTE — Procedures (Signed)
Extubation Procedure Note  Patient Details:   Name: Sandra Turner DOB: [birth year] MRN: [id number]   Airway Documentation:     Evaluation  O2 sats: stable throughout  Complications: No apparent complications Patient did tolerate procedure well. Bilateral Breath Sounds: Diminished, Rhonchi Suctioning: Airway Yes Pt extubated to 4L [place]. Alert and oriented, stable on documented settings. VSS and NAD noted.   Prescilla SoursScott E Jacqeline Broers 02/07/2015, 11:31 AM

## 2015-02-07 NOTE — Progress Notes (Signed)
PT Cancellation Note  (Late entry for 1430)  Patient Details Name: Sandra AdasRita Poch MRN: [id number] DOB: 1941-07-08   Cancelled Treatment:    Reason Eval/Treat Not Completed: Patient at procedure or test/unavailable. [facility] Nursing plans to put her in recliner when she is finished.    Gurleen Larrivee 02/07/2015, 4:12 PM  Pager [phone]

## 2015-02-07 NOTE — Progress Notes (Addendum)
[address]       [place] [id number]             [phone]        CARDIOTHORACIC SURGERY PROGRESS NOTE   R19 Days Post-Op Procedure(s) (LRB): AORTIC VALVE REPLACEMENT (AVR) (N/A) MAZE (N/A)  AORTIC ROOT ENLARGEMENT (N/A) TRANSESOPHAGEAL ECHOCARDIOGRAM (TEE) (N/A)  Subjective: Awake and alert on vent.  Denies pain or feeling SOB  Objective: Vital signs: BP Readings from Last 1 Encounters:  02/07/15 116/39   Pulse Readings from Last 1 Encounters:  02/07/15 73   Resp Readings from Last 1 Encounters:  02/07/15 21   Temp Readings from Last 1 Encounters:  02/07/15 97.5 F (36.4 C) Oral    Hemodynamics:    Physical Exam:  Rhythm:   Afib w/ controlled rate  Breath sounds: clear  Heart sounds:  irregular  Incisions:  Clean and dry  Abdomen:  Soft, non-distended, non-tender, tolerating tube feeds  Extremities:  Warm, well-perfused  Chest tubes:  Low volume thin serosanguinous output, no air leak    Intake/Output from previous day: 06/05 0701 - 06/06 0700 In: 1202.5 [I.V.:287.5; NG/GT:915] Out: 3270 [Urine:2900; Chest Tube:370] Intake/Output this shift: Total I/O In: 42.5 [I.V.:12.5; NG/GT:30] Out: 245 [Urine:125; Chest Tube:120]  Lab Results:  CBC: Recent Labs  02/06/15 0340 02/07/15 0519  WBC 12.4* 11.6*  HGB 9.5* 9.5*  HCT 30.2* 30.7*  PLT 148* 156    BMET:  Recent Labs  02/06/15 0340 02/07/15 0519  NA 144 146*  K 4.4 4.3  CL 95* 97*  CO2 40* 40*  GLUCOSE 124* 123*  BUN 64* 57*  CREATININE 1.24* 1.23*  CALCIUM 8.7* 8.9     PT/INR:   Recent Labs  02/07/15 0519  LABPROT 19.7*  INR 1.67*    CBG (last 3)   Recent Labs  02/07/15 0011 02/07/15 0404 02/07/15 0813  GLUCAP 178* 107* 116*    ABG    Component Value Date/Time   PHART 7.507* 02/05/2015 0442   PCO2ART 50.1* 02/05/2015 0442   PO2ART 85.0 02/05/2015 0442   HCO3 39.8* 02/05/2015 0442   TCO2 41 02/05/2015 0442   ACIDBASEDEF TEST WILL BE  CREDITED 01/20/2015 0930   O2SAT 97.0 02/05/2015 0442    CXR: PORTABLE CHEST - 1 VIEW  COMPARISON: 02/05/2015 and 02/06/2015.  FINDINGS: 0632 hr. Endotracheal tube, nasogastric tube, right arm PICC and bilateral chest tubes are unchanged in position. The side hole for the left chest tube is near the chest wall. There is stable cardiomegaly status post median sternotomy. Small left pleural effusion and mild left basilar atelectasis persist. There is interval improved aeration of the right lung base. No pneumothorax.  IMPRESSION: Interval improved aeration of the lung bases with mild left lower lobe atelectasis and small left pleural effusion. Unchanged support system.   Electronically Signed  By: Carey BullocksWilliam Veazey M.D.  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 Oxygenation and gas exchange good. CXR looks remarkably clear. Repeat bronch yesterday. Continue vent support, nebs and antibiotics per Pulm/CCM team. May require tracheostomy, but I think we should consider acute vent wean and extubation today and plan for tracheostomy if she fails  again. Steroids restarted yesterday due to concerns of possible hypersensitivity pneumonitis due to chronic exposure to molds and other respiratory pathogens at home/work  WU:[id number] atrial fibrillation w/ stable hemodynamics. Chronic diastolic CHF and pulmonary hypertension with normal LV systolic function, normal aortic valve function. Continue amiodarone to per  tube.  ID:Day #7 Cefepime empiric Rx for HCAP - Vancomycin stopped. Afebrile and WBC stable 11,600 today. Endobronchial lavage culture from 5/30 reported as "Non-Pathogenic Oropharyngeal-type Flora". Repeat BAL pending.  RENAL:Acute renal insufficiency likely due to pre-renal azotemia from over-diuresis +/- ATN +/- ACE-I therapy - resolving w/ creatinine stable at 1.2 this morning. I/O's negative 2068 mL yesterday. Weight down further, below preop baseline. Will reduce dose of lasix and monitor closely. Still still has metabolic alkalosis and sodium rising.   HEME:Expected post op acute blood loss anemia, Hgb stable 9.5 today. Leukocytosis stable. INR down to 1.7 today - will continue to hold coumadin in anticipation of possible need for tracheostomy.  Add low dose lovenox for now.  ZHY:QMVHQIONGEXB stable. Sodium creeping up to 146. Potassium stable 4.3. Tolerating tube feeds. Free water added yesterday.  NEURO:Intact. Lightly sedated on vent. Comfortable.  DISP:Anticipate full recovery but progress likely to be slow.   Purcell Nails 02/07/2015 9:14 AM

## 2015-02-07 NOTE — Progress Notes (Signed)
Pt evening Aerosol treatments were administered through Flutter. Pt has been receiving CPT through the bed. Pt is up in chair at this time.

## 2015-02-07 NOTE — Progress Notes (Signed)
Patient ID: Sandra Turner, female   DOB: [birth year], 74 y.o.   MRN: [id number] EVENING ROUNDS NOTE :     [address]       [place] [id number]             [phone]                 19 Days Post-Op Procedure(s) (LRB): AORTIC VALVE REPLACEMENT (AVR) (N/A) MAZE (N/A)  AORTIC ROOT ENLARGEMENT (N/A) TRANSESOPHAGEAL ECHOCARDIOGRAM (TEE) (N/A)  Total Length of Stay:  LOS: 19 days  BP 149/52 mmHg  Pulse 67  Temp(Src) 97.6 F (36.4 C) (Oral)  Resp 28  Ht 5\' 2"  (1.575 m)  Wt 235 lb 7.2 oz (106.8 kg)  BMI 43.05 kg/m2  SpO2 97%  .Intake/Output      06/06 0701 - 06/07 0700   I.V. (mL/kg) 112.5 (1.1)   NG/GT 30   Total Intake(mL/kg) 142.5 (1.3)   Urine (mL/kg/hr) 1250 (1)   Chest Tube 300 (0.2)   Total Output 1550   Net -1407.5         . sodium chloride Stopped (01/30/15 1900)  . sodium chloride 10 mL/hr at 02/07/15 1800  . feeding supplement (OSMOLITE 1.5 CAL) Stopped (02/07/15 0900)  . fentaNYL infusion INTRAVENOUS Stopped (02/07/15 0900)     Lab Results  Component Value Date   WBC 11.6* 02/07/2015   HGB 9.5* 02/07/2015   HCT 30.7* 02/07/2015   PLT 156 02/07/2015   GLUCOSE 123* 02/07/2015   ALT 48 02/07/2015   AST 55* 02/07/2015   NA 146* 02/07/2015   K 4.3 02/07/2015   CL 97* 02/07/2015   CREATININE 1.23* 02/07/2015   BUN 57* 02/07/2015   CO2 40* 02/07/2015   TSH 0.916 08/22/2012   INR 1.67* 02/07/2015   HGBA1C 5.3 01/17/2015   Tolerating extubation Contraction alkalosis serum CO2 40 inr 1.67, if remains off vent resume coumadin when sure will not need trach  Delight OvensEdward B Ferne Ellingwood MD  Beeper [phone] Office [phone] 02/07/2015 7:01 PM

## 2015-02-08 ENCOUNTER — Inpatient Hospital Stay (HOSPITAL_COMMUNITY): Payer: Medicare Other

## 2015-02-08 LAB — GLUCOSE, CAPILLARY
GLUCOSE-CAPILLARY: 104 mg/dL — AB (ref 65–99)
GLUCOSE-CAPILLARY: 119 mg/dL — AB (ref 65–99)
GLUCOSE-CAPILLARY: 127 mg/dL — AB (ref 65–99)
GLUCOSE-CAPILLARY: 142 mg/dL — AB (ref 65–99)
Glucose-Capillary: 101 mg/dL — ABNORMAL HIGH (ref 65–99)
Glucose-Capillary: 90 mg/dL (ref 65–99)

## 2015-02-08 LAB — CULTURE, RESPIRATORY: SPECIAL REQUESTS: NORMAL

## 2015-02-08 LAB — BASIC METABOLIC PANEL
ANION GAP: 9 (ref 5–15)
BUN: 49 mg/dL — AB (ref 6–20)
CO2: 39 mmol/L — ABNORMAL HIGH (ref 22–32)
Calcium: 9.1 mg/dL (ref 8.9–10.3)
Chloride: 100 mmol/L — ABNORMAL LOW (ref 101–111)
Creatinine, Ser: 1.19 mg/dL — ABNORMAL HIGH (ref 0.44–1.00)
GFR, EST AFRICAN AMERICAN: 52 mL/min — AB (ref >60)
GFR, EST NON AFRICAN AMERICAN: 44 mL/min — AB (ref >60)
GLUCOSE: 109 mg/dL — AB (ref 65–99)
Potassium: 4.4 mmol/L (ref 3.5–5.1)
Sodium: 148 mmol/L — ABNORMAL HIGH (ref 135–145)

## 2015-02-08 LAB — CULTURE, RESPIRATORY W GRAM STAIN
Culture: NO GROWTH
Gram Stain: NONE SEEN

## 2015-02-08 LAB — PROTIME-INR
INR: 1.73 — AB (ref 0.00–1.49)
Prothrombin Time: 20.2 seconds — ABNORMAL HIGH (ref 11.6–15.2)

## 2015-02-08 MED ORDER — OSMOLITE 1.5 CAL PO LIQD
1000.0000 mL | ORAL | Status: DC
  Filled 2015-02-08 (×2): qty 1000

## 2015-02-08 MED ORDER — AMIODARONE PEDIATRIC ORAL SUSPENSION 5 MG/ML
200.0000 mg | Freq: Two times a day (BID) | ORAL | Status: DC
  Administered 2015-02-09 – 2015-02-13 (×10): 200 mg
  Filled 2015-02-08 (×18): qty 40

## 2015-02-08 MED ORDER — WARFARIN SODIUM 2.5 MG PO TABS
2.5000 mg | ORAL_TABLET | Freq: Every day | ORAL | Status: DC
  Administered 2015-02-08: 2.5 mg via ORAL
  Filled 2015-02-08 (×2): qty 1

## 2015-02-08 MED ORDER — PANTOPRAZOLE SODIUM 40 MG PO PACK
40.0000 mg | PACK | Freq: Every day | ORAL | Status: DC
  Administered 2015-02-09 – 2015-02-17 (×7): 40 mg
  Filled 2015-02-08 (×11): qty 20

## 2015-02-08 MED ORDER — VITAL HIGH PROTEIN PO LIQD
1000.0000 mL | ORAL | Status: DC
Start: 2015-02-08 — End: 2015-02-08

## 2015-02-08 MED ORDER — PANTOPRAZOLE SODIUM 40 MG IV SOLR
40.0000 mg | INTRAVENOUS | Status: DC
  Administered 2015-02-08: 40 mg via INTRAVENOUS
  Filled 2015-02-08: qty 40

## 2015-02-08 MED ORDER — VITAL AF 1.2 CAL PO LIQD
1000.0000 mL | ORAL | Status: DC
  Administered 2015-02-08 – 2015-02-16 (×9): 1000 mL
  Filled 2015-02-08 (×26): qty 1000

## 2015-02-08 MED ORDER — AMIODARONE HCL IN DEXTROSE 360-4.14 MG/200ML-% IV SOLN
30.0000 mg/h | INTRAVENOUS | Status: AC
  Administered 2015-02-08: 30 mg/h via INTRAVENOUS
  Filled 2015-02-08 (×4): qty 200

## 2015-02-08 NOTE — Procedures (Signed)
Objective Swallowing Evaluation:  (FEES)  Patient Details  Name: Sandra Turner MRN: [id number] Date of Birth: 1940/12/12  Today's Date: 02/08/2015 Time: SLP Start Time (ACUTE ONLY): 1045-SLP Stop Time (ACUTE ONLY): 1115 SLP Time Calculation (min) (ACUTE ONLY): 30 min  Past Medical History:  Past Medical History  Diagnosis Date  . Arthritis   . Chronic atrial fibrillation     takes Pradaxa daily  . Aortic stenosis     Severe by echo August 2015  . Pneumonia 09/2014  . History of bronchitis 2015  . Joint pain   . Back pain     reason unknown  . Family history of adverse reaction to anesthesia     uncle with MH in the 60's,cousin in the 4770's with MH  . 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 None    . Left and right heart catheterization with coronary angiogram N/A 11/01/2014    Procedure: LEFT AND RIGHT HEART CATHETERIZATION WITH CORONARY ANGIOGRAM;  Surgeon: Kathleene Hazelhristopher D McAlhany, MD;  [facility];  Service: Cardiovascular;  Laterality: N/A;  . Aortic valve replacement N/A 01/19/2015    Procedure: AORTIC VALVE REPLACEMENT (AVR);  Surgeon: Purcell Nailslarence H Owen, MD;  [facility];  Service: Open Heart Surgery;  Laterality: N/A;  . Maze N/A 01/19/2015    Procedure: MAZE;  Surgeon: Purcell Nailslarence H Owen, MD;  [facility];  Service: Open Heart Surgery;  Laterality: N/A;  . Aortic root enlargement N/A 01/19/2015    Procedure:  AORTIC ROOT ENLARGEMENT;  Surgeon: Purcell Nailslarence H Owen, MD;  [facility];  Service: Open Heart Surgery;  Laterality: N/A;  . Tee without cardioversion  N/A 01/19/2015    Procedure: TRANSESOPHAGEAL ECHOCARDIOGRAM (TEE);  Surgeon: Purcell Nailslarence H Owen, MD;  [facility];  Service: Open Heart Surgery;  Laterality: N/A;   HPI:  Other Pertinent Information: Pt is a 74 year old female who underwent AVR on 5/18. 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   No Data Recorded  Assessment / Plan / Recommendation CHL IP CLINICAL IMPRESSIONS 02/08/2015  Therapy Diagnosis Severe pharyngeal phase dysphagia  Clinical Impression Pt demonstrates severe oropharyngeal dysphagia with motor and sensory impairment. Pt silently aspirates puree textures and small ice chip boluses during the swallow due to incomplete airway closure and weakness of the hyolaryngeal complex,with diffuse residuals. The pt was observed to have no movement of the left arytenoid leaving airway partially unprotected during the swallow; laryngeal tissue and vocal folds are erythematous and edematous. Sensation is severely impaired, pt had no cough response to applesauce on and below the glottis. Cued coughs did not fully expel aspirate or penetrate and head turn/chin tuck made no improvement on function. Recommend pt remain strictly NPO with consideration for short term method of nutrition. Will consider repeat FEES in  3-5 days.       CHL IP TREATMENT RECOMMENDATION 02/08/2015  Treatment Recommendations F/U FEES in --- days (Comment)     CHL IP DIET RECOMMENDATION 02/08/2015  SLP Diet Recommendations NPO;Alternative means - temporary  Liquid Administration via (None)   Medication Administration (None)  Compensations (None)  Postural Changes and/or Swallow Maneuvers (None)     CHL IP OTHER RECOMMENDATIONS 02/08/2015  Recommended Consults (None)  Oral Care Recommendations Oral care QID  Other Recommendations (None)     No flowsheet data found.   CHL IP FREQUENCY AND DURATION 02/08/2015  Speech Therapy Frequency (ACUTE ONLY) min 3x week  Treatment Duration 2 weeks     Pertinent Vitals/Pain NA    SLP Swallow Goals No flowsheet data found.  No flowsheet data found.    CHL IP REASON FOR REFERRAL 02/08/2015  Reason for Referral Objectively evaluate swallowing function     CHL IP ORAL PHASE 02/08/2015  Lips (None)  Tongue (None)  Mucous membranes (None)  Nutritional status (None)  Other (None)  Oxygen therapy (None)  Oral Phase WFL  Oral - Pudding Teaspoon (None)  Oral - Pudding Cup (None)  Oral - Honey Teaspoon (None)  Oral - Honey Cup (None)  Oral - Honey Syringe (None)  Oral - Nectar Teaspoon (None)  Oral - Nectar Cup (None)  Oral - Nectar Straw (None)  Oral - Nectar Syringe (None)  Oral - Ice Chips (None)  Oral - Thin Teaspoon (None)  Oral - Thin Cup (None)  Oral - Thin Straw (None)  Oral - Thin Syringe (None)  Oral - Puree (None)  Oral - Mechanical Soft (None)  Oral - Regular (None)  Oral - Multi-consistency (None)  Oral - Pill (None)  Oral Phase - Comment (None)      CHL IP PHARYNGEAL PHASE 02/08/2015  Pharyngeal Phase Impaired  Pharyngeal - Pudding Teaspoon (None)  Penetration/Aspiration details (pudding teaspoon) (None)  Pharyngeal - Pudding Cup (None)  Penetration/Aspiration details (pudding cup) (None)  Pharyngeal - Honey Teaspoon (None)  Penetration/Aspiration details (honey teaspoon) (None)  Pharyngeal - Honey Cup (None)  Penetration/Aspiration details (honey cup) (None)  Pharyngeal - Honey Syringe (None)  Penetration/Aspiration details (honey syringe) (None)  Pharyngeal - Nectar Teaspoon (None)   Penetration/Aspiration details (nectar teaspoon) (None)  Pharyngeal - Nectar Cup (None)  Penetration/Aspiration details (nectar cup) (None)  Pharyngeal - Nectar Straw (None)  Penetration/Aspiration details (nectar straw) (None)  Pharyngeal - Nectar Syringe (None)  Penetration/Aspiration details (nectar syringe) (None)  Pharyngeal - Ice Chips (None)  Penetration/Aspiration details (ice chips) (None)  Pharyngeal - Thin Teaspoon (None)  Penetration/Aspiration details (thin teaspoon) (None)  Pharyngeal - Thin Cup (None)  Penetration/Aspiration details (thin cup) (None)  Pharyngeal - Thin Straw (None)  Penetration/Aspiration details (thin straw) (None)  Pharyngeal - Thin Syringe (None)  Penetration/Aspiration details (thin syringe') (None)  Pharyngeal - Puree (None)  Penetration/Aspiration details (puree) (None)  Pharyngeal - Mechanical Soft (None)  Penetration/Aspiration details (mechanical soft) (None)  Pharyngeal - Regular (None)  Penetration/Aspiration details (regular) (None)  Pharyngeal - Multi-consistency (None)  Penetration/Aspiration details (multi-consistency) (None)  Pharyngeal - Pill (None)  Penetration/Aspiration details (pill) (None)  Pharyngeal Comment (None)      No flowsheet data found.  No flowsheet data found.        Harlon Ditty, [place] CCC-SLP [phone]  Claudine Mouton 02/08/2015, 11:35 AM

## 2015-02-08 NOTE — Evaluation (Signed)
Clinical/Bedside Swallow Evaluation Patient Details  Name: Sandra Turner MRN: [id number] Date of Birth: 03/15/1941  Today's Date: 02/08/2015 Time: SLP Start Time (ACUTE ONLY): 0900 SLP Stop Time (ACUTE ONLY): 0913 SLP Time Calculation (min) (ACUTE ONLY): 13 min  Past Medical History:  Past Medical History  Diagnosis Date  . Arthritis   . Chronic atrial fibrillation     takes Pradaxa daily  . Aortic stenosis     Severe by echo August 2015  . Pneumonia 09/2014  . History of bronchitis 2015  . Joint pain   . Back pain     reason unknown  . Family history of adverse reaction to anesthesia     uncle with MH in the 60's,cousin in the 73's with MH  . 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 None    . Left and right heart catheterization with coronary angiogram N/A 11/01/2014    Procedure: LEFT AND RIGHT HEART CATHETERIZATION WITH CORONARY ANGIOGRAM;  Surgeon: [name], MD;  [facility] LAB;  Service: Cardiovascular;  Laterality: N/A;  . Aortic valve replacement N/A 01/19/2015    Procedure: AORTIC VALVE REPLACEMENT (AVR);  Surgeon: Purcell Nails, MD;  Location: [facility] OR;  Service: Open Heart Surgery;  Laterality: N/A;  . Maze N/A 01/19/2015    Procedure: MAZE;  Surgeon: Purcell Nails, MD;  Location: [facility] OR;  Service: Open Heart Surgery;  Laterality: N/A;  . Aortic root enlargement N/A 01/19/2015    Procedure:  AORTIC ROOT ENLARGEMENT;  Surgeon: Purcell Nails, MD;  Location: [facility] OR;  Service: Open Heart Surgery;  Laterality: N/A;  . Tee without cardioversion N/A  01/19/2015    Procedure: TRANSESOPHAGEAL ECHOCARDIOGRAM (TEE);  Surgeon: Purcell Nails, MD;  Location: [facility] OR;  Service: Open Heart Surgery;  Laterality: N/A;   HPI:  Pt is a 74 year old female who underwent AVR on 5/18. 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    Assessment / Plan / Recommendation Clinical Impression  Pt demontrates signs of an acute reversible dysphagia following 7 day intubation, also complicated by thick bronchial mucous and dx of tracheomalacia. Trials of thin liquids result in immediate cough, though puree is tolerated without wet vocal quality. Presentation suggests pt may tolerate a modified diet at this point as function improves over time. Recommend FEES to objectively evaluate swallow function.     Aspiration Risk  Moderate    Diet Recommendation NPO        Other  Recommendations     Follow Up Recommendations       Frequency and Duration        Pertinent Vitals/Pain NA    SLP Swallow Goals     Swallow Study Prior Functional Status       General Other Pertinent Information:  Pt is a 74 year old female who underwent AVR on 5/18. 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  Type of Study: Bedside swallow evaluation Previous Swallow Assessment: none Diet Prior to this Study: NPO Temperature Spikes Noted: No Respiratory Status: Supplemental O2 delivered via (comment) History of Recent Intubation: Yes Length of Intubations (days): 7 days (5/18-5/19; 5/30-6/6) Date extubated: 02/07/15 Behavior/Cognition: Alert;Cooperative Oral Cavity - Dentition: Adequate natural dentition/normal for age Self-Feeding Abilities: Able to feed self Patient Positioning: Upright in chair/Tumbleform Baseline Vocal Quality: Hoarse;Breathy Volitional Cough: Congested Volitional Swallow: Able to elicit    Oral/Motor/Sensory Function Overall Oral Motor/Sensory Function: Appears within functional limits for tasks assessed   Ice Chips Ice chips: Impaired Presentation: Spoon;Self Fed Pharyngeal Phase Impairments: Wet Vocal Quality (mutliple swallow)   Thin Liquid Thin Liquid: Impaired Presentation: Cup;Self Fed Pharyngeal  Phase Impairments: Cough - Immediate;Multiple swallows    Nectar Thick Nectar Thick Liquid: Not tested   Honey Thick Honey Thick Liquid: Not tested   Puree Puree: Within functional limits   Solid   GO    Solid: Not tested      Harlon DittyBonnie Ginnie Marich, MA CCC-SLP [phone]  Claudine MoutonDeBlois, Lanesha Azzaro Caroline 02/08/2015,9:27 AM

## 2015-02-08 NOTE — Care Management Note (Signed)
Case Management Note  Patient Details  Name: Sandra Turner MRN: [id number] Date of Birth: 05-05-1941  Subjective/Objective:     Extubated on 6-6.  Sitting up in chair on [place].  Failed swallow test plan for Tube feedings. Still with two CT's.  Ltach level of discharge may be more appropriate for her needs.  Will need Ltach order.                Action/Plan:   Expected Discharge Date:                  Expected Discharge Plan:  Home w Home Health Services  In-House Referral:     Discharge planning Services     Post Acute Care Choice:    Choice offered to:     DME Arranged:    DME Agency:     HH Arranged:    HH Agency:     Status of Service:  In process, will continue to follow  Medicare Important Message Given:  Yes Date Medicare IM Given:  02/08/15 Medicare IM give by:  Avie ArenasSarah Kimoni Pagliarulo, RNBSN Date Additional Medicare IM Given:    Additional Medicare Important Message give by:     If discussed at Long Length of Stay Meetings, dates discussed:    Additional Comments:  Vangie BickerBrown, Bassheva Flury Jane, RN 02/08/2015, 1:55 PM

## 2015-02-08 NOTE — Progress Notes (Signed)
CPT order written for bed therapy. Pt is sitting up in chair. Aerosol treatments given to patient with flutter inline. Pt does well with this.

## 2015-02-08 NOTE — Progress Notes (Signed)
Physical Therapy Treatment Patient Details Name: Sandra Turner MRN: [id number] DOB: 09/07/1940 Today's Date: 02/08/2015    History of Present Illness Sandra Turner is a 74 year old female adm with dyspnea, severe aortic stenosis, and atrial fibrillation. 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 Intubated 01/31/15 with likely due to HCAP, severe atelectasis w/ mucous plugging, required multiple bronchoscopy with lavage due to mucous plugging, bil chest tubes placed, extubated 6/6    PT Comments    Pt tolerated walking much better today. Ambulated on 1L O2 with max RR 32 (with seated rest decr to 22 in 2 minutes) and SaO2 88% briefly (up to 90 then 94% within 1 minute). She required max encouragement and ultimately agreed. Walked again after seated rest; each time 15 feet.    Follow Up Recommendations  SNF;Supervision/Assistance - 24 hour (however if Medicaid only[facility]     Equipment Recommendations  Rolling walker with 5" wheels;3in1 (PT)    Recommendations for Other Services       Precautions / Restrictions Precautions Precautions: Fall;Sternal Precaution Comments: educated and required cueing throughout the session. Poor awareness of sternal precautions in general.  Restrictions Weight Bearing Restrictions: Yes Other Position/Activity Restrictions: Sternal precautions    Mobility  Bed Mobility               General bed mobility comments: Pt was sitting up in the recliner upon PT arrival.   Transfers Overall transfer level: Needs assistance Equipment used: Pushed w/c Transfers: Sit to/from Stand Sit to Stand: Min assist;+2 safety/equipment         General transfer comment: VC's to maintain sternal precautions.x 2  Ambulation/Gait Ambulation/Gait assistance: Min assist;+2 safety/equipment Ambulation Distance (Feet): 15 Feet (seated rest x 4 minutes; 15 again) Assistive device:  (push w/c) Gait Pattern/deviations:  Step-through pattern;Decreased stride length Gait velocity: Decreased Gait velocity interpretation: Below normal speed for age/gender General Gait Details: Pt requires w/c due to multiple lines/tubes/O2 and IVs. Pt able to maneuver w/c well with very little pressure through her hands. Limited by dyspnea and decr SaO2   Stairs            Wheelchair Mobility    Modified Rankin (Stroke Patients Only)       Balance     Sitting balance-Leahy Scale: Fair     Standing balance support: No upper extremity supported Standing balance-Leahy Scale: Fair Standing balance comment: stood without UE support 30 seconds each time upon standing                    Cognition Arousal/Alertness: Awake/alert Behavior During Therapy: WFL for tasks assessed/performed;Anxious Overall Cognitive Status: Within Functional Limits for tasks assessed                      Exercises General Exercises - Lower Extremity Ankle Circles/Pumps: AROM;Both;20 reps;Seated Heel Raises: AROM;Both;10 reps;Seated    General Comments General comments (skin integrity, edema, etc.): Educated on importance of walking for lung clearance and overall strength (initially stating she could not walk). Educated on pursed lip breathing      Pertinent Vitals/Pain Pain Assessment: Faces Faces Pain Scale: Hurts even more Pain Location: bil chest tube sites Pain Intervention(s): Limited activity within patient's tolerance;Monitored during session;Repositioned    Home Living                      Prior Function  PT Goals (current goals can now be found in the care plan section) Acute Rehab PT Goals Patient Stated Goal: To be able to use the bathroom Time For Goal Achievement: 02/16/15 Progress towards PT goals: Progressing toward goals    Frequency  Min 3X/week    PT Plan Current plan remains appropriate    Co-evaluation             End of Session Equipment Utilized  During Treatment: Oxygen Activity Tolerance: Patient limited by fatigue Patient left: in chair;with call bell/phone within reach     Time: 1138-1205 PT Time Calculation (min) (ACUTE ONLY): 27 min  Charges:  $Gait Training: 23-37 mins                    G Codes:      Raford Brissett 02/24/2015, 12:22 PM Pager [phone]

## 2015-02-08 NOTE — Progress Notes (Addendum)
Nutrition Consult/Follow-Up  DOCUMENTATION CODES:  Morbid obesity  INTERVENTION:  Initiate Vital AF 1.2 formula at 20 ml/hr and increase by 10 ml every 4 hours to goal rate of 70 ml/hr  TF regimen to provide 2016 kcals, 126 gm protein, 1362 ml of free water  NUTRITION DIAGNOSIS:  Inadequate oral intake now related to dysphagia as evidenced by NPO status, ongoing  GOAL:  Patient will meet greater than or equal to 90% of their needs, currently unmet  MONITOR:  TF tolerance, Labs, Weight trends, I & O's  ASSESSMENT: 74 yo Female developed progressive dyspnea/hypoxia from AECOPD and pulmonary edema/pleural effusions after AVR with MAZE on 5/18.  Patient s/p procedure 5/31: AORTIC VALVE REPLACEMENT (AVR)  MAZE PROCEDURE AORTIC ROOT ENLARGEMENT  Pt extubated 6/6.    TF (Osmolite 1.5 formula) discontinued.  S/p FEES today.  SLP recommending continued NPO status given severe dysphagia.    Small bore feeding tube in place.  Tip location unknown at this time.  RD consulted for TF initiation & management.  Height:  Ht Readings from Last 1 Encounters:  01/20/15 5\' 2"  (1.575 m)    Weight:  Wt Readings from Last 1 Encounters:  02/08/15 232 lb 12.9 oz (105.6 kg)    Ideal Body Weight:  50 kg  Wt Readings from Last 10 Encounters:  02/08/15 232 lb 12.9 oz (105.6 kg)  01/17/15 247 lb (112.038 kg)  12/27/14 231 lb (104.781 kg)  11/22/14 240 lb (108.863 kg)  10/04/14 241 lb 9.6 oz (109.589 kg)  09/11/14 240 lb 11.2 oz (109.181 kg)  08/16/14 249 lb (112.946 kg)  05/12/14 249 lb (112.946 kg)  04/26/14 236 lb (107.049 kg)  10/26/13 248 lb (112.492 kg)    BMI:  Body mass index is 42.57 kg/(m^2).  Re-estimated Nutritional Needs:  Kcal:  2000-2200  Protein:  110-120 gm  Fluid:  per MD  Skin:  Wound (see comment) (Stage I to bilateral ears)  Diet Order:  NPO  EDUCATION NEEDS:  No education needs identified at this time   Intake/Output Summary (Last 24 hours)  at 02/08/15 1547 Last data filed at 02/08/15 1500  Gross per 24 hour  Intake  356.9 ml  Output   2145 ml  Net -1788.1 ml    Last BM:  6/2  Maureen ChattersKatie Idolina Mantell, RD, LDN Pager #: [phone] After-Hours Pager #: [phone]

## 2015-02-08 NOTE — Progress Notes (Signed)
CT surgery p.m. Rounds  Patient breathing comfortably sitting up in chair after extubation yesterday Patient did not past swallow test-Panda tube placed and KUB shows tip to be in proximal stomach Patient will receiving amiodarone to maintain sinus rhythm via tube until she can swallow safely

## 2015-02-08 NOTE — Progress Notes (Signed)
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  10/2014 Serum eosinophil count notably elevated at 500cell/microliter. Bronch today with significant tracheomalacia and acute inflammation and thick, glue like secretions.  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 EOS checked - normal. IgE - pending. S/p bronch. STARted on IV steroids  02/07/15: HX as above from Dr Kendrick Fries over phone. Reviewe of 2013 CT Chest - ? ABPA v HP.  Wil send HP Panel Currently doing SBT - Dr Cornelius Moras feels patient is looking as good as shehas gotten so far. Patient feels she is ready for extubation   SUBJECTIVE/OVERNIGHT/INTERVAL HX 02/08/15: s/p extubation yesterday. Now sittiing in chair. Feels well. Voice hoarse.RN says that doing well on liquids but needs FEES. She is watching TV. 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 Last 3 years 3 admits for resp issues dx as "pneumonia". Rx with abx and steroids with resolution per hx   O: Filed Vitals:   02/08/15 0700 02/08/15 0800 02/08/15 0816 02/08/15 0826  BP: 151/66 138/53 138/53   Pulse: 79 75 72   Temp:    97.6 F (36.4 C)  TempSrc:    Oral  Resp: 16 32 26   Height:      Weight:      SpO2: 98% 97% 100%             Gen: off vent. Watching TV HENT: NCAT, Voice hoarse PULM: no wheezing. No distress CV: RRR, no murmur - bilateral chest tubes + GI: BS+, soft, nontender Derm: left hand bruising better, hand warm with normal cap refill Neuro: follows commands, normal conversation, sitting in chair,     PULMONARY  Recent Labs Lab 02/02/15 0420 02/03/15 0422 02/04/15 0431 02/05/15 0442  PHART 7.521* 7.429 7.445 7.507*  PCO2ART 44.0 57.8* 56.4* 50.1*  PO2ART 65.0* 85.0 96.0 85.0  HCO3 36.1* 38.5* 38.7* 39.8*  TCO2 37 40 40 41  O2SAT 95.0 97.0 97.0 97.0    CBC  Recent Labs Lab 02/05/15 0448  02/06/15 0340 02/07/15 0519  HGB 7.6* 9.5* 9.5*  HCT 24.3* 30.2* 30.7*  WBC 12.3* 12.4* 11.6*  PLT 145* 148* 156    COAGULATION  Recent Labs Lab 02/04/15 0430 02/05/15 0448 02/06/15 0340 02/07/15 0519 02/08/15 0449  INR 2.12* 2.27* 1.90* 1.67* 1.73*    CARDIAC  No results for input(s): TROPONINI in the last 168 hours. No results for input(s): PROBNP in the last 168 hours.   CHEMISTRY  Recent Labs Lab 02/04/15 0430 02/05/15 0448 02/06/15 0340 02/07/15 0519 02/08/15 0449  NA 139 142 144 146* 148*  K 4.2 4.6 4.4 4.3 4.4  CL 94* 96* 95* 97* 100*  CO2 35* 36* 40* 40* 39*  GLUCOSE 116* 126* 124* 123* 109*  BUN 76* 73* 64* 57* 49*  CREATININE 1.24* 1.27* 1.24* 1.23* 1.19*  CALCIUM 8.8* 8.7* 8.7* 8.9 9.1   Estimated Creatinine Clearance: 48.8 mL/min (by C-G formula based on Cr of 1.19).   LIVER  Recent Labs Lab 02/04/15 0430 02/05/15 0448 02/06/15 0340 02/07/15 0519 02/08/15 0449  AST  --   --   --  55*  --   ALT  --   --   --  48  --   ALKPHOS  --   --   --  106  --   BILITOT  --   --   --  1.0  --   PROT  --   --   --  5.6*  --   ALBUMIN  --   --   --  2.5*  --   INR 2.12* 2.27* 1.90* 1.67* 1.73*     INFECTIOUS  Recent Labs Lab 02/04/15 0430  PROCALCITON 0.11     ENDOCRINE CBG (last 3)   Recent Labs  02/07/15 1948 02/07/15 2336 02/08/15 0824  GLUCAP 97 104* 90         IMAGING x48h  - image(s) personally visualized  -   highlighted in bold Dg Chest Port 1 View  02/08/2015   CLINICAL DATA:  Respiratory failure  EXAM: PORTABLE CHEST - 1 VIEW  COMPARISON:  Portable chest x-ray of February 07, 2015  FINDINGS: There has been interval extubation of the trachea and esophagus. The lungs are mildly hypoinflated. The interstitial markings are coarse. The right-sided chest tube is unchanged in position. No pneumothorax is observed. Small amounts of pleural fluid are present bilaterally. The cardiac silhouette is enlarged. The prosthetic aortic  valve and the left atrial appendage clip are unchanged in position. There are 8 intact sternal wires. The central pulmonary vascularity is prominent. The PICC line tip projects over the midportion of the SVC. The bony thorax exhibits no acute abnormality. There are degenerative changes of both shoulders.  IMPRESSION: 1. Mild hypoinflation post extubation of the trachea. There is no alveolar pneumonia. Small bilateral pleural effusions and left lower lobe atelectasis persist. 2. Cardiomegaly with minimal pulmonary vascular congestion. 3. The right-sided chest tube is unchanged in position. There is no pneumothorax.   Electronically Signed   By: David  SwazilandJordan M.D.   On: 02/08/2015 07:32   Dg Chest Port 1 View  02/07/2015   CLINICAL DATA:  Respiratory failure  EXAM: PORTABLE CHEST - 1 VIEW  COMPARISON:  02/05/2015 and 02/06/2015.  FINDINGS: 0632 hr. Endotracheal tube, nasogastric tube, right arm PICC and bilateral chest tubes are unchanged in position. The side hole for the left chest tube is near the chest wall. There is stable cardiomegaly status post median sternotomy. Small left pleural effusion and mild left basilar atelectasis persist. There is interval improved aeration of the right lung base. No pneumothorax.  IMPRESSION: Interval improved aeration of the lung bases with mild left lower lobe atelectasis and small left pleural effusion. Unchanged support system.   Electronically Signed   By: Carey BullocksWilliam  Veazey M.D.   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 S/p successful extubation 02/07/2015; . She might hae ABPA  V HP due to mold exposure (2013 CT chest). Eos currently normal  Plan: Await HP panel  Await IgE Continue pulmonary toilette measures again today (mucomyst x24 hours, guaifenesin, chest PT, bag lavage) Continue solumedrol and nebulized steroid Monitor off abx Await  tracheal wash for fungal culture and bacterial culture 02/06/15  Family 02/06/15: PCCM MD  dated her cousin Gerri Spore vanMeter by phone     PCCM will follow    Dr. Kalman Shan, M.D., [employer].C.P Pulmonary and Critical Care Medicine Staff Physician [facility] Pager: [phone], If no answer or between  15:00h - 7:00h: call [phone]  02/08/2015 10:12 AM

## 2015-02-08 NOTE — Progress Notes (Addendum)
[address]       [place], [id number]             [phone]        CARDIOTHORACIC SURGERY PROGRESS NOTE   R20 Days Post-Op Procedure(s) (LRB): AORTIC VALVE REPLACEMENT (AVR) (N/A) MAZE (N/A)  AORTIC ROOT ENLARGEMENT (N/A) TRANSESOPHAGEAL ECHOCARDIOGRAM (TEE) (N/A)  Subjective: Looks great. Extubated uneventfully yesterday.  Breathing comfortably and reports considerable improvement.  Productive cough.  Voice is hoarse but denies any problems w/ swallowing  Objective: Vital signs: BP Readings from Last 1 Encounters:  02/08/15 151/66   Pulse Readings from Last 1 Encounters:  02/08/15 79   Resp Readings from Last 1 Encounters:  02/08/15 16   Temp Readings from Last 1 Encounters:  02/08/15 97.8 F (36.6 C) Oral    Hemodynamics:    Physical Exam:  Rhythm:   Afib w/ controlled rate  Breath sounds: Fairly clear  Heart sounds:  irregular  Incisions:  Clean and dry  Abdomen:  Soft, non-distended, non-tender  Extremities:  Warm, well-perfused  Chest tubes:  Low volume thin serosanguinous output, no air leak    Intake/Output from previous day: 06/06 0701 - 06/07 0700 In: 272.5 [I.V.:242.5; NG/GT:30] Out: 2535 [Urine:1975; Chest Tube:560] Intake/Output this shift:    Lab Results:  CBC: Recent Labs  02/06/15 0340 02/07/15 0519  WBC 12.4* 11.6*  HGB 9.5* 9.5*  HCT 30.2* 30.7*  PLT 148* 156    BMET:  Recent Labs  02/07/15 0519 02/08/15 0449  NA 146* 148*  K 4.3 4.4  CL 97* 100*  CO2 40* 39*  GLUCOSE 123* 109*  BUN 57* 49*  CREATININE 1.23* 1.19*  CALCIUM 8.9 9.1     PT/INR:   Recent Labs  02/08/15 0449  LABPROT 20.2*  INR 1.73*    CBG (last 3)   Recent Labs  02/07/15 1533 02/07/15 1948 02/07/15 2336  GLUCAP 117* 97 104*    ABG    Component Value Date/Time   PHART 7.507* 02/05/2015 0442   PCO2ART 50.1* 02/05/2015 0442   PO2ART 85.0 02/05/2015 0442   HCO3 39.8* 02/05/2015 0442   TCO2 41 02/05/2015 0442    ACIDBASEDEF TEST WILL BE CREDITED 01/20/2015 0930   O2SAT 97.0 02/05/2015 0442    CXR: Looks good.  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 Extubated uneventfully yesterday. CXR looks remarkably clear. 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  Pleural tubes still draining although output decreased - will leave in for now  BJ:[id number]:Rate-controlled atrial fibrillation w/ stable hemodynamics. BP trending up.  Chronic diastolic CHF and pulmonary hypertension with normal LV systolic function, normal aortic valve function. Continue amiodarone - will give IV today until patient able to take meds orally  ID:Now off antibiotics after completing a 7 day course of empiric Maxepime. Afebrile and WBC stable 11,600 yesterday. Endobronchial lavage culture from 5/30 reported as "Non-Pathogenic Oropharyngeal-type Flora". Repeat BAL "no growth" so far.  RENAL:Resolved  acute renal insufficiency likely due to pre-renal azotemia from over-diuresis +/- ATN +/- ACE-I therapy - creatinine stable at 1.2 last few days. I/O's negative 2263 mL yesterday. Weight down further, below preop baseline. Will hold lasix today and monitor closely. Still still has metabolic alkalosis and sodium  rising.   HEME:Expected post op acute blood loss anemia, Hgb stable 9.5 yesterday. Leukocytosis stable. INR stable 1.7 today - coumadin on hold last few days due to concerns of possible need for tracheostomy - will resume coumadin and continue low dose lovenox for now.  ZOX:[id number] stable. Sodium creeping up to 148. Potassium stable 4.4. Need to check swallowing and hopefully resume oral diet.  Will give Protonix IV until patient able to take oral meds  NEURO:Intact.  DISP:Anticipate full recovery.   Purcell Nails 02/08/2015 7:27 AM

## 2015-02-09 ENCOUNTER — Inpatient Hospital Stay (HOSPITAL_COMMUNITY): Payer: Medicare Other

## 2015-02-09 LAB — GLUCOSE, CAPILLARY
GLUCOSE-CAPILLARY: 116 mg/dL — AB (ref 65–99)
Glucose-Capillary: 140 mg/dL — ABNORMAL HIGH (ref 65–99)
Glucose-Capillary: 120 mg/dL — ABNORMAL HIGH (ref 65–99)
Glucose-Capillary: 99 mg/dL (ref 65–99)
Glucose-Capillary: 199 mg/dL — ABNORMAL HIGH (ref 65–99)
Glucose-Capillary: 135 mg/dL — ABNORMAL HIGH (ref 65–99)

## 2015-02-09 LAB — CBC
HCT: 32.4 % — ABNORMAL LOW (ref 36.0–46.0)
Hemoglobin: 9.8 g/dL — ABNORMAL LOW (ref 12.0–15.0)
MCH: 29.3 pg (ref 26.0–34.0)
MCHC: 30.2 g/dL (ref 30.0–36.0)
MCV: 97 fL (ref 78.0–100.0)
PLATELETS: 190 10*3/uL (ref 150–400)
RBC: 3.34 MIL/uL — ABNORMAL LOW (ref 3.87–5.11)
RDW: 15.4 % (ref 11.5–15.5)
WBC: 15.8 10*3/uL — AB (ref 4.0–10.5)

## 2015-02-09 LAB — BASIC METABOLIC PANEL
Anion gap: 11 (ref 5–15)
BUN: 49 mg/dL — AB (ref 6–20)
CALCIUM: 8.8 mg/dL — AB (ref 8.9–10.3)
CHLORIDE: 100 mmol/L — AB (ref 101–111)
CO2: 35 mmol/L — ABNORMAL HIGH (ref 22–32)
Creatinine, Ser: 1.17 mg/dL — ABNORMAL HIGH (ref 0.44–1.00)
GFR, EST AFRICAN AMERICAN: 53 mL/min — AB (ref >60)
GFR, EST NON AFRICAN AMERICAN: 45 mL/min — AB (ref >60)
Glucose, Bld: 121 mg/dL — ABNORMAL HIGH (ref 65–99)
Potassium: 4.1 mmol/L (ref 3.5–5.1)
Sodium: 146 mmol/L — ABNORMAL HIGH (ref 135–145)

## 2015-02-09 LAB — PROTIME-INR
INR: 2.23 — ABNORMAL HIGH (ref 0.00–1.49)
PROTHROMBIN TIME: 24.5 s — AB (ref 11.6–15.2)

## 2015-02-09 MED ORDER — AMLODIPINE 1 MG/ML ORAL SUSPENSION
10.0000 mg | Freq: Every day | ORAL | Status: DC
  Administered 2015-02-09 – 2015-02-17 (×9): 10 mg
  Filled 2015-02-09 (×14): qty 10

## 2015-02-09 MED ORDER — FREE WATER
200.0000 mL | Freq: Three times a day (TID) | Status: DC
  Administered 2015-02-09 – 2015-02-10 (×4): 200 mL

## 2015-02-09 MED ORDER — ACETAMINOPHEN 160 MG/5ML PO SOLN
650.0000 mg | Freq: Four times a day (QID) | ORAL | Status: DC | PRN
  Filled 2015-02-09: qty 20.3

## 2015-02-09 MED ORDER — WARFARIN SODIUM 1 MG PO TABS
1.0000 mg | ORAL_TABLET | Freq: Every day | ORAL | Status: DC
  Administered 2015-02-09: 1 mg via ORAL
  Filled 2015-02-09 (×2): qty 1

## 2015-02-09 NOTE — Progress Notes (Signed)
[address]       [place], [id number]             [phone]        CARDIOTHORACIC SURGERY PROGRESS NOTE   R21 Days Post-Op Procedure(s) (LRB): AORTIC VALVE REPLACEMENT (AVR) (N/A) MAZE (N/A)  AORTIC ROOT ENLARGEMENT (N/A) TRANSESOPHAGEAL ECHOCARDIOGRAM (TEE) (N/A)  Subjective: Very upset about feeding tube.  Threatening to pull it out.  Denies pain, SOB.  + productive cough.  Voice remains hoarse  Objective: Vital signs: BP Readings from Last 1 Encounters:  02/09/15 155/125   Pulse Readings from Last 1 Encounters:  02/09/15 77   Resp Readings from Last 1 Encounters:  02/09/15 21   Temp Readings from Last 1 Encounters:  02/09/15 97.8 F (36.6 C) Oral    Hemodynamics:    Physical Exam:  Rhythm:   Afib w/ controlled rate  Breath sounds: Scattered rhonchi  Heart sounds:  irregular  Incisions:  Clean and dry  Abdomen:  Soft, non-distended, non-tender  Extremities:  Warm, well-perfused  Chest tubes:  Low volume thin serosanguinous output, no air leak    Intake/Output from previous day: 06/07 0701 - 06/08 0700 In: 943.7 [I.V.:413.7; NG/GT:530] Out: 1745 [Urine:1465; Chest Tube:280] Intake/Output this shift:    Lab Results:  CBC: Recent Labs  02/07/15 0519 02/09/15 0520  WBC 11.6* 15.8*  HGB 9.5* 9.8*  HCT 30.7* 32.4*  PLT 156 190    BMET:  Recent Labs  02/08/15 0449 02/09/15 0520  NA 148* 146*  K 4.4 4.1  CL 100* 100*  CO2 39* 35*  GLUCOSE 109* 121*  BUN 49* 49*  CREATININE 1.19* 1.17*  CALCIUM 9.1 8.8*     PT/INR:   Recent Labs  02/09/15 0520  LABPROT 24.5*  INR 2.23*    CBG (last 3)   Recent Labs  02/08/15 1923 02/09/15 0122 02/09/15 0350  GLUCAP 142* 140* 120*    ABG    Component Value Date/Time   PHART 7.507* 02/05/2015 0442   PCO2ART 50.1* 02/05/2015 0442   PO2ART 85.0 02/05/2015 0442   HCO3 39.8* 02/05/2015 0442   TCO2 41 02/05/2015 0442   ACIDBASEDEF TEST WILL BE CREDITED 01/20/2015  0930   O2SAT 97.0 02/05/2015 0442    CXR: PORTABLE CHEST - 1 VIEW  COMPARISON: Chest x-ray of February 08, 2015 at 6:01 a.m.  FINDINGS: The lungs are mildly hypoinflated. There has been interval placement of a feeding tube whose radiodense bulb overlies the mid gastric body. The pulmonary interstitial markings remain increased. The right-sided chest tube tip projects between the seventh and eighth ribs. There is no pneumothorax. There is a small right pleural effusion. The left-sided chest tube is been withdrawn such that its proximal port lies in the axillary soft tissues. There is no pneumothorax.  The cardiac silhouette remains enlarged. The pulmonary vascularity remains prominent. The prosthetic aortic valve and left atrial appendage clip are visible. The right-sided PICC line tip projects over the midportion of the SVC. There are 8 intact sternal wires.  IMPRESSION: 1. Bilateral pulmonary hypoinflation no evidence of a pneumothorax. There are small bilateral pleural effusions. The right-sided chest tube is in reasonable position. The left-sided chest tube has been partially withdrawn such that its proximal port lies outside the thoracic cavity. 2. Stable cardiomegaly with pulmonary vascular congestion. 3. Critical Value/emergent results were called by telephone at the time of interpretation on 02/09/2015 at 8:03 am to Jillene BucksMary Mills, RN, who verbally acknowledged these  results.   Electronically Signed  By: David Swaziland M.D.  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 Extubated uneventfully 2 days ago and very stable ever since. CXR looks remarkably clear. 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 Pleural tube output decreased - will d/c both tubes today  ZO:[id number] atrial fibrillation w/ stable hemodynamics. BP trending up. Chronic diastolic CHF and pulmonary hypertension with normal LV systolic function, normal aortic valve function. Previous attempts to add ACE-I temporally associated w/ acute renal insufficiency - will add Norvasc.  Continue amiodarone.  ID:Now off antibiotics after completing a 7 day course of empiric Maxepime. Afebrile.  WBC increased but this is associated w/ resuming IV steroids. Endobronchial lavage culture from 5/30 reported as "Non-Pathogenic Oropharyngeal-type Flora". Repeat BAL from 6/5 reported "no growth"   RENAL:Resolved acute renal insufficiency likely due to pre-renal azotemia from over-diuresis +/- ATN +/- ACE-I therapy - creatinine stable at 1.2 last few days. I/O's negative 801 mL yesterday. Weight down further, below preop baseline. Will continue to hold lasix today and monitor closely. Still still has metabolic alkalosis but improving.   Sodium down slightly 146.   HEME:Expected post op acute blood loss anemia, Hgb stable 9.8 today. Leukocytosis increased. INR increased 2.2 today - decrease coumadin dose  JYN:[id number] stable. Sodium down slightly 146 - continue free water. Potassium stable 4.1. Patient did poorly w/ initial swallowing assessment and voice remains hoarse.  Patient not tolerating the presence of her feeding tube - I have bargained with  her to give it at least 1 more day.  Will reevaluate swallowing function tomorrow.  I remain hopeful that her swallowing function will recover reasonably quickly  NEURO:Intact.  DISP:Anticipate full recovery.  Purcell Nails 02/09/2015 8:18 AM

## 2015-02-09 NOTE — Progress Notes (Signed)
      [address]       [place] [id number]             [phone]      Up in chair, no complaints at present  BP 107/55 mmHg  Pulse 71  Temp(Src) 98 F (36.7 C) (Oral)  Resp 24  Ht 5\' 2"  (1.575 m)  Wt 230 lb 9.6 oz (104.6 kg)  BMI 42.17 kg/m2  SpO2 95%   Intake/Output Summary (Last 24 hours) at 02/09/15 1852 Last data filed at 02/09/15 1700  Gross per 24 hour  Intake 1266.7 ml  Output   1095 ml  Net  171.7 ml    Continue present care  Viviann SpareSteven C. Dorris FetchHendrickson, MD [facility] [phone]

## 2015-02-09 NOTE — Progress Notes (Signed)
SLP  Note  Patient Details Name: Cheyenne AdasRita Vanyo MRN: [id number] DOB: [birth year]   Read Dr. Orvan Julywen's note. Will plan for swallow reeval tomorrow given pts discomfort.  Finneas Mathe, Riley NearingBonnie Caroline 02/09/2015, 9:57 AM

## 2015-02-09 NOTE — Plan of Care (Signed)
Problem: Phase II Progression Outcomes Goal: Tolerating prescribed nutrition plan Outcome: Progressing Pt npo post swallow eval 6/7. Pt tolerating tube feedings via-panda.

## 2015-02-10 ENCOUNTER — Inpatient Hospital Stay (HOSPITAL_COMMUNITY): Payer: Medicare Other

## 2015-02-10 LAB — BASIC METABOLIC PANEL
Anion gap: 9 (ref 5–15)
BUN: 51 mg/dL — ABNORMAL HIGH (ref 6–20)
CALCIUM: 8.7 mg/dL — AB (ref 8.9–10.3)
CO2: 36 mmol/L — ABNORMAL HIGH (ref 22–32)
Chloride: 102 mmol/L (ref 101–111)
Creatinine, Ser: 1.15 mg/dL — ABNORMAL HIGH (ref 0.44–1.00)
GFR calc Af Amer: 54 mL/min — ABNORMAL LOW (ref >60)
GFR calc non Af Amer: 46 mL/min — ABNORMAL LOW (ref >60)
Glucose, Bld: 137 mg/dL — ABNORMAL HIGH (ref 65–99)
Potassium: 3.8 mmol/L (ref 3.5–5.1)
SODIUM: 147 mmol/L — AB (ref 135–145)

## 2015-02-10 LAB — PROTIME-INR
INR: 1.85 — ABNORMAL HIGH (ref 0.00–1.49)
PROTHROMBIN TIME: 21.2 s — AB (ref 11.6–15.2)

## 2015-02-10 LAB — GLUCOSE, CAPILLARY
GLUCOSE-CAPILLARY: 127 mg/dL — AB (ref 65–99)
GLUCOSE-CAPILLARY: 119 mg/dL — AB (ref 65–99)
Glucose-Capillary: 105 mg/dL — ABNORMAL HIGH (ref 65–99)
Glucose-Capillary: 129 mg/dL — ABNORMAL HIGH (ref 65–99)
Glucose-Capillary: 133 mg/dL — ABNORMAL HIGH (ref 65–99)
Glucose-Capillary: 150 mg/dL — ABNORMAL HIGH (ref 65–99)
Glucose-Capillary: 150 mg/dL — ABNORMAL HIGH (ref 65–99)

## 2015-02-10 LAB — CBC
HEMATOCRIT: 31.8 % — AB (ref 36.0–46.0)
HEMOGLOBIN: 9.7 g/dL — AB (ref 12.0–15.0)
MCH: 29.6 pg (ref 26.0–34.0)
MCHC: 30.5 g/dL (ref 30.0–36.0)
MCV: 97 fL (ref 78.0–100.0)
Platelets: 157 10*3/uL (ref 150–400)
RBC: 3.28 MIL/uL — ABNORMAL LOW (ref 3.87–5.11)
RDW: 15.3 % (ref 11.5–15.5)
WBC: 14.7 10*3/uL — ABNORMAL HIGH (ref 4.0–10.5)

## 2015-02-10 MED ORDER — FREE WATER
200.0000 mL | Freq: Four times a day (QID) | Status: DC
  Administered 2015-02-10 – 2015-02-17 (×30): 200 mL

## 2015-02-10 MED ORDER — WARFARIN SODIUM 2.5 MG PO TABS
2.5000 mg | ORAL_TABLET | Freq: Every day | ORAL | Status: DC
  Administered 2015-02-10: 2.5 mg via ORAL
  Filled 2015-02-10: qty 1

## 2015-02-10 MED ORDER — WARFARIN SODIUM 2 MG PO TABS
2.0000 mg | ORAL_TABLET | Freq: Every day | ORAL | Status: DC
  Filled 2015-02-10: qty 1

## 2015-02-10 NOTE — Procedures (Signed)
Objective Swallowing Evaluation:    Patient Details  Name: Sandra Turner MRN: [id number] Date of Birth: [birth year]  Today's Date: 02/10/2015 Time: SLP Start Time (ACUTE ONLY): 1330-SLP Stop Time (ACUTE ONLY): 1345 SLP Time Calculation (min) (ACUTE ONLY): 15 min  Past Medical History:  Past Medical History  Diagnosis Date  . Arthritis   . Chronic atrial fibrillation     takes Pradaxa daily  . Aortic stenosis     Severe by echo August 2015  . Pneumonia 09/2014  . History of bronchitis 2015  . Joint pain   . Back pain     reason unknown  . Family history of adverse reaction to anesthesia     uncle with MH in the 60's,cousin in the 17's with MH  . 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 None    . Left and right heart catheterization with coronary angiogram N/A 11/01/2014    Procedure: LEFT AND RIGHT HEART CATHETERIZATION WITH CORONARY ANGIOGRAM;  Surgeon: [name], MD;  Location: [facility] CATH LAB;  Service: Cardiovascular;  Laterality: N/A;  . Aortic valve replacement N/A 01/19/2015    Procedure: AORTIC VALVE REPLACEMENT (AVR);  Surgeon: Purcell Nails, MD;  Location: [facility] OR;  Service: Open Heart Surgery;  Laterality: N/A;  . Maze N/A 01/19/2015    Procedure: MAZE;  Surgeon: Purcell Nails, MD;  Location: [facility] OR;  Service: Open Heart Surgery;  Laterality: N/A;  . Aortic root enlargement N/A 01/19/2015    Procedure:  AORTIC ROOT ENLARGEMENT;  Surgeon: Purcell Nails, MD;  Location: [facility] OR;  Service: Open Heart Surgery;  Laterality: N/A;  . Tee without cardioversion N/A  01/19/2015    Procedure: TRANSESOPHAGEAL ECHOCARDIOGRAM (TEE);  Surgeon: Purcell Nails, MD;  Location: [facility] OR;  Service: Open Heart Surgery;  Laterality: N/A;   HPI:  Other Pertinent Information: Pt is a 74 year old female who underwent AVR on 5/18. 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   No Data Recorded  Assessment / Plan / Recommendation CHL IP CLINICAL IMPRESSIONS 02/10/2015  Therapy Diagnosis Severe pharyngeal phase dysphagia  Clinical Impression Pt demonstrates minimal improvement in swallow function. Weakness has improved with fewer residuals, but airway protection continues to be poor. The left arytenoid does not adduct fully and the vocal folds do not make contact at all during attempts to phonate, cough, hold breath or swallow. Nectar, honey and puree are silently aspirated during the swallow due to open airway. Pt cannot clear aspirate despite max efforts and postures do not improve function.  Any PO intake would result in high aspiration risk. Provided visual feedback to pt to show her applesauce in her airway after only 1/2 teaspoon bolus with no apparent sensation from pt. Pt will need much more time for recovery. There do  appear to be two ulcerations bilaterally on the posterior vocal folds. Perhaps with time to heal adduction will improve. Concerned that pt may need prolonged recovery for safe swallowing.       CHL IP TREATMENT RECOMMENDATION 02/10/2015  Treatment Recommendations Therapy as outlined in treatment plan below     CHL IP DIET  RECOMMENDATION 02/10/2015  SLP Diet Recommendations Alternative means - temporary;NPO  Liquid Administration via (None)  Medication Administration (None)  Compensations (None)  Postural Changes and/or Swallow Maneuvers (None)     CHL IP OTHER RECOMMENDATIONS 02/08/2015  Recommended Consults (None)  Oral Care Recommendations Oral care QID  Other Recommendations (None)     No flowsheet data found.   CHL IP FREQUENCY AND DURATION 02/10/2015  Speech Therapy Frequency (ACUTE ONLY) min 2x/week  Treatment Duration 2 weeks     Pertinent Vitals/Pain NA    SLP Swallow Goals No flowsheet data found.  No flowsheet data found.    CHL IP REASON FOR REFERRAL 02/10/2015  Reason for Referral Objectively evaluate swallowing function     CHL IP ORAL PHASE 02/10/2015  Lips (None)  Tongue (None)  Mucous membranes (None)  Nutritional status (None)  Other (None)  Oxygen therapy (None)  Oral Phase WFL  Oral - Pudding Teaspoon (None)  Oral - Pudding Cup (None)  Oral - Honey Teaspoon (None)  Oral - Honey Cup (None)  Oral - Honey Syringe (None)  Oral - Nectar Teaspoon (None)  Oral - Nectar Cup (None)  Oral - Nectar Straw (None)  Oral - Nectar Syringe (None)  Oral - Ice Chips (None)  Oral - Thin Teaspoon (None)  Oral - Thin Cup (None)  Oral - Thin Straw (None)  Oral - Thin Syringe (None)  Oral - Puree (None)  Oral - Mechanical Soft (None)  Oral - Regular (None)  Oral - Multi-consistency (None)  Oral - Pill (None)  Oral Phase - Comment (None)      CHL IP PHARYNGEAL PHASE 02/10/2015  Pharyngeal Phase Impaired  Pharyngeal - Pudding Teaspoon (None)  Penetration/Aspiration details (pudding teaspoon) (None)  Pharyngeal - Pudding Cup (None)  Penetration/Aspiration details (pudding cup) (None)  Pharyngeal - Honey Teaspoon (None)  Penetration/Aspiration details (honey teaspoon) (None)  Pharyngeal - Honey Cup (None)  Penetration/Aspiration details (honey cup) (None)  Pharyngeal - Honey Syringe  (None)  Penetration/Aspiration details (honey syringe) (None)  Pharyngeal - Nectar Teaspoon (None)  Penetration/Aspiration details (nectar teaspoon) (None)  Pharyngeal - Nectar Cup (None)  Penetration/Aspiration details (nectar cup) (None)  Pharyngeal - Nectar Straw (None)  Penetration/Aspiration details (nectar straw) (None)  Pharyngeal - Nectar Syringe (None)  Penetration/Aspiration details (nectar syringe) (None)  Pharyngeal - Ice Chips (None)  Penetration/Aspiration details (ice chips) (None)  Pharyngeal - Thin Teaspoon (None)  Penetration/Aspiration details (thin teaspoon) (None)  Pharyngeal - Thin Cup (None)  Penetration/Aspiration details (thin cup) (None)  Pharyngeal - Thin Straw (None)  Penetration/Aspiration details (thin straw) (None)  Pharyngeal - Thin Syringe (None)  Penetration/Aspiration details (thin syringe') (None)  Pharyngeal - Puree (None)  Penetration/Aspiration details (puree) (None)  Pharyngeal - Mechanical Soft (None)  Penetration/Aspiration details (mechanical soft) (None)  Pharyngeal - Regular (None)  Penetration/Aspiration details (regular) (None)  Pharyngeal - Multi-consistency (None)  Penetration/Aspiration details (multi-consistency) (None)  Pharyngeal - Pill (None)  Penetration/Aspiration details (pill) (None)  Pharyngeal Comment (None)      No flowsheet data found.  No flowsheet data found.        Harlon Ditty, [place] CCC-SLP [phone]  Claudine Mouton 02/10/2015, 2:23  PM   

## 2015-02-10 NOTE — Progress Notes (Addendum)
      [address]       [name] [address]             [phone]        CARDIOTHORACIC SURGERY PROGRESS NOTE   R22 Days Post-Op Procedure(s) (LRB): AORTIC VALVE REPLACEMENT (AVR) (N/A) MAZE (N/A)  AORTIC ROOT ENLARGEMENT (N/A) TRANSESOPHAGEAL ECHOCARDIOGRAM (TEE) (N/A)  Subjective: Feels better.  Slept some last night.  No pain.  Denies SOB.  Still w/ productive cough.  Voice remains quite hoarse.  Objective: Vital signs: BP Readings from Last 1 Encounters:  02/10/15 145/75   Pulse Readings from Last 1 Encounters:  02/10/15 69   Resp Readings from Last 1 Encounters:  02/10/15 21   Temp Readings from Last 1 Encounters:  02/10/15 97.7 F (36.5 C) Oral    Hemodynamics:    Physical Exam:  Rhythm:   Afib w/ controlled rate  Breath sounds: Scattered rhonchi  Heart sounds:  Irregular  Incisions:  Clean and dry  Abdomen:  Soft, non-distended, non-tender  Extremities:  Warm, well-perfused    Intake/Output from previous day: 06/08 0701 - 06/09 0700 In: 1630 [I.V.:200; NG/GT:1430] Out: 1071 [Urine:1000; Stool:1; Chest Tube:70] Intake/Output this shift:    Lab Results:  CBC: Recent Labs  02/09/15 0520 02/10/15 0410  WBC 15.8* 14.7*  HGB 9.8* 9.7*  HCT 32.4* 31.8*  PLT 190 157    BMET:  Recent Labs  02/09/15 0520 02/10/15 0410  NA 146* 147*  K 4.1 3.8  CL 100* 102  CO2 35* 36*  GLUCOSE 121* 137*  BUN 49* 51*  CREATININE 1.17* 1.15*  CALCIUM 8.8* 8.7*     PT/INR:   Recent Labs  02/10/15 0410  LABPROT 21.2*  INR 1.85*    CBG (last 3)   Recent Labs  02/09/15 1928 02/10/15 0001 02/10/15 0352  GLUCAP 135* 133* 127*    ABG    Component Value Date/Time   PHART 7.507* 02/05/2015 0442   PCO2ART 50.1* 02/05/2015 0442   PO2ART 85.0 02/05/2015 0442   HCO3 39.8* 02/05/2015 0442   TCO2 41 02/05/2015 0442   ACIDBASEDEF TEST WILL BE CREDITED 01/20/2015 0930   O2SAT 97.0 02/05/2015 0442    CXR: PORTABLE CHEST - 1  VIEW  COMPARISON: 02/09/2015  FINDINGS: Bilateral chest tubes have been removed. No pneumothorax  Right arm PICC tip in the SVC. Feeding tube in the stomach.  Hypoventilation with bibasilar atelectasis. Slight increase in right lower lobe atelectasis since the prior study. Small right effusion has developed since the prior study. Negative for pulmonary edema.  IMPRESSION: Bilateral chest tube removal without pneumothorax  Mild increase in right lower lobe atelectasis and small right effusion.   Electronically Signed  By: Marlan Palau M.D.  On: 02/10/2015 07:18   Assessment/Plan: S/P Procedure(s) (LRB): AORTIC VALVE REPLACEMENT (AVR) (N/A) MAZE (N/A)  AORTIC ROOT ENLARGEMENT (N/A) TRANSESOPHAGEAL ECHOCARDIOGRAM (TEE) (N/A)  Remains stable since extubation 3 days ago Productive cough, clearing secretions fairly well Voice remains hoarse, awaiting f/u from SLP team Hypernatremia persists  Hypertension improve Rate-controlled atrial fibrillation on amiodarone + coumadin   Mobilize  PT  Increase free water  Increase coumadin  Continue to hold all diuretics  Possibly ready for transfer to step down if Pulm/CCM team agrees  ? Plan for steroids and long-term f/u by Pulmonary Medicine  Purcell Nails 02/10/2015 7:12 AM

## 2015-02-10 NOTE — Progress Notes (Signed)
Physical Therapy Treatment Patient Details Name: Sandra Turner MRN: [id number] DOB: [birth year] Today's Date: 02/10/2015    History of Present Illness Shannon Balthazar is a 74 year old female adm with dyspnea, severe aortic stenosis, and atrial fibrillation. 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 Intubated 01/31/15 with likely due to HCAP, severe atelectasis w/ mucous plugging, required multiple bronchoscopy with lavage due to mucous plugging, bil chest tubes placed, extubated 6/6    PT Comments    Patient making slow improvement with mobility and gait.  Follow Up Recommendations  SNF;Supervision/Assistance - 24 hour (however if Medicaid only, will not qualify for PT at SNF)     Equipment Recommendations  Rolling walker with 5" wheels;3in1 (PT)    Recommendations for Other Services       Precautions / Restrictions Precautions Precautions: Fall;Sternal Precaution Comments: Education on sternal precautions Restrictions Weight Bearing Restrictions: Yes Other Position/Activity Restrictions: Sternal precautions    Mobility  Bed Mobility               General bed mobility comments: Patient in chair as PT entered room  Transfers Overall transfer level: Needs assistance Equipment used: 2 person hand held assist;Rolling walker (2 wheeled) Transfers: Sit to/from [employer] Sit to Stand: Min assist;+2 safety/equipment Stand pivot transfers: Min assist;+2 safety/equipment       General transfer comment: Verbal cues for hand placement to maintain sternal precautions.  Assist to power up to standing and to steady.  Patient able to take several shuffle steps to pivot chair <> BSC.  Able to stand for pericare following BM, and to initiate ambulation.  Ambulation/Gait Ambulation/Gait assistance: Min assist;+2 safety/equipment Ambulation Distance (Feet): 22 Feet Assistive device: Rolling walker (2 wheeled) Gait Pattern/deviations: Step-through  pattern;Decreased step length - right;Decreased step length - left;Decreased stride length;Shuffle;Trunk flexed Gait velocity: Decreased Gait velocity interpretation: Below normal speed for age/gender General Gait Details: Verbal cues for safe use of RW.  Patient able to maneuver RW during gait.  Slow, steady gait with flexed posture.  Patient ambulating on room air.  O2 sats dropped to 85%.  Reapplied O2 at 1 l/min.  Patient taking deep breaths and O2 sat returned to 94% within 30 seconds.   Stairs            Wheelchair Mobility    Modified Rankin (Stroke Patients Only)       Balance                                    Cognition Arousal/Alertness: Awake/alert Behavior During Therapy: Anxious Overall Cognitive Status: Within Functional Limits for tasks assessed                      Exercises      General Comments        Pertinent Vitals/Pain Pain Assessment: Faces Faces Pain Scale: Hurts little more Pain Location: chest wall Pain Descriptors / Indicators: Sore Pain Intervention(s): Limited activity within patient's tolerance;Repositioned    Home Living                      Prior Function            PT Goals (current goals can now be found in the care plan section) Progress towards PT goals: Progressing toward goals    Frequency  Min 3X/week    PT Plan Current plan remains appropriate  Co-evaluation             End of Session Equipment Utilized During Treatment: Oxygen Activity Tolerance: Patient limited by fatigue Patient left: in chair;with call bell/phone within reach     Time: 1155-1226 PT Time Calculation (min) (ACUTE ONLY): 31 min  Charges:  $Gait Training: 8-22 mins $Therapeutic Activity: 8-22 mins                    G Codes:      Vena Austria February 15, 2015, 4:41 PM Durenda Hurt. Renaldo Fiddler, [facility] Pager [phone]

## 2015-02-10 NOTE — Progress Notes (Signed)
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  10/2014 Serum eosinophil count notably elevated at 500cell/microliter. Bronch today with significant tracheomalacia and acute inflammation and thick, glue like secretions.  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 EOS checked - normal. IgE - pending. S/p bronch. STARted on IV steroids  02/07/15: HX as above from Dr Kendrick Fries over phone. Reviewe of 2013 CT Chest - ? ABPA v HP.  Wil send HP Panel Currently doing SBT - Dr Cornelius Moras feels patient is looking as good as shehas gotten so far. Patient feels she is ready for extubation    02/08/15: s/p extubation yesterday. Now sittiing in chair. Feels well. Voice hoarse.RN says that doing well on liquids but needs FEES. She is watching TV. 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 Last 3 years 3 admits for resp issues dx as "pneumonia". Rx with abx and steroids with resolution per hx   SUBJECTIVE/OVERNIGHT/INTERVAL HX 02/10/15:  Again failed swallow per RN. PEr chart review of speech Rx notes: left ayrtenoid did not adduct and cord folds are not making contact. Also, posterior vocal cord fold uler +. Prolonged recovery anticipated. Needin 2L  otherwise desats per RN. Did walk with PT and in room per RN. Edema + Paient reports fatigue and frustration with her medical course  O: Filed Vitals:   02/10/15 1500 02/10/15 1539 02/10/15 1600 02/10/15 1700  BP: 139/77  140/53 135/50  Pulse: 72  75 73  Temp:  98 F (36.7 C)    TempSrc:  Oral    Resp: 23  21 27   Height:      Weight:      SpO2: 98%  99% 100%          Gen:  Watching TV, sitting in chair HENT: NCAT, Voice hoarse PULM: no wheezing. No distress. Occ scattered crackles CHEST - median sternotomy scar with bruising + CV: RRR, no murmur - bilateral chest tubes  out GI: BS+, soft, nontender Derm:  left hand bruising better, hand warm with normal cap refill Neuro: follows commands, normal conversation, sitting in chair,     PULMONARY  Recent Labs Lab 02/04/15 0431 02/05/15 0442  PHART 7.445 7.507*  PCO2ART 56.4* 50.1*  PO2ART 96.0 85.0  HCO3 38.7* 39.8*  TCO2 40 41  O2SAT 97.0 97.0    CBC  Recent Labs Lab 02/07/15 0519 02/09/15 0520 02/10/15 0410  HGB 9.5* 9.8* 9.7*  HCT 30.7* 32.4* 31.8*  WBC 11.6* 15.8* 14.7*  PLT 156 190 157    COAGULATION  Recent Labs Lab 02/06/15 0340 02/07/15 0519 02/08/15 0449 02/09/15 0520 02/10/15 0410  INR 1.90* 1.67* 1.73* 2.23* 1.85*    CARDIAC  No results for input(s): TROPONINI in the last 168 hours. No results for input(s): PROBNP in the last 168 hours.   CHEMISTRY  Recent Labs Lab 02/06/15 0340 02/07/15 0519 02/08/15 0449 02/09/15 0520 02/10/15 0410  NA 144 146* 148* 146* 147*  K 4.4 4.3 4.4 4.1 3.8  CL 95* 97* 100* 100* 102  CO2 40* 40* 39* 35* 36*  GLUCOSE 124* 123* 109* 121* 137*  BUN 64* 57* 49* 49* 51*  CREATININE 1.24* 1.23* 1.19* 1.17* 1.15*  CALCIUM 8.7* 8.9 9.1 8.8* 8.7*   Estimated Creatinine Clearance: 50.1 mL/min (by C-G formula based on Cr of 1.15).   LIVER  Recent Labs Lab 02/06/15 0340  02/07/15 0519 02/08/15 0449 02/09/15 0520 02/10/15 0410  AST  --  55*  --   --   --   ALT  --  48  --   --   --   ALKPHOS  --  106  --   --   --   BILITOT  --  1.0  --   --   --   PROT  --  5.6*  --   --   --   ALBUMIN  --  2.5*  --   --   --   INR 1.90* 1.67* 1.73* 2.23* 1.85*     INFECTIOUS  Recent Labs Lab 02/04/15 0430  PROCALCITON 0.11     ENDOCRINE CBG (last 3)   Recent Labs  02/10/15 0729 02/10/15 1208 02/10/15 1541  GLUCAP 119* 129* 105*         IMAGING x48h  - image(s) personally visualized  -   highlighted in bold Dg Chest Port 1 View  02/10/2015   CLINICAL DATA:  Atelectasis  EXAM: PORTABLE CHEST - 1 VIEW  COMPARISON:  02/09/2015  FINDINGS: Bilateral chest  tubes have been removed.  No pneumothorax  Right arm PICC tip in the SVC.  Feeding tube in the stomach.  Hypoventilation with bibasilar atelectasis. Slight increase in right lower lobe atelectasis since the prior study. Small right effusion has developed since the prior study. Negative for pulmonary edema.  IMPRESSION: Bilateral chest tube removal without pneumothorax  Mild increase in right lower lobe atelectasis and small right effusion.   Electronically Signed   By: Marlan Palau M.D.   On: 02/10/2015 07:18   Dg Chest Port 1 View  02/09/2015   CLINICAL DATA:  Follow-up of respiratory failure, status post aortic valve replacement and Maze procedure on Jan 19, 2015  EXAM: PORTABLE CHEST - 1 VIEW  COMPARISON:  Chest x-ray of February 08, 2015 at 6:01 a.m.  FINDINGS: The lungs are mildly hypoinflated. There has been interval placement of a feeding tube whose radiodense bulb overlies the mid gastric body. The pulmonary interstitial markings remain increased. The right-sided chest tube tip projects between the seventh and eighth ribs. There is no pneumothorax. There is a small right pleural effusion. The left-sided chest tube is been withdrawn such that its proximal port lies in the axillary soft tissues. There is no pneumothorax.  The cardiac silhouette remains enlarged. The pulmonary vascularity remains prominent. The prosthetic aortic valve and left atrial appendage clip are visible. The right-sided PICC line tip projects over the midportion of the SVC. There are 8 intact sternal wires.  IMPRESSION: 1. Bilateral pulmonary hypoinflation no evidence of a pneumothorax. There are small bilateral pleural effusions. The right-sided chest tube is in reasonable position. The left-sided chest tube has been partially withdrawn such that its proximal port lies outside the thoracic cavity. 2. Stable cardiomegaly with pulmonary vascular congestion. 3. Critical Value/emergent results were called by telephone at the time of  interpretation on 02/09/2015 at 8:03 am to Jillene Bucks, RN, who verbally acknowledged these results.   Electronically Signed   By: David  [place] M.D.   On: 02/09/2015 08:04   CT 2013 - micronodules -    Results for orders placed or performed during the hospital encounter of 01/19/15  Culture, respiratory (NON-Expectorated)     Status: None   Collection Time: 01/31/15  1:15 PM  Result Value Ref Range Status   Specimen Description TRACHEAL ASPIRATE  Final   Special Requests NONE  Final  Gram Stain   Final    RARE WBC PRESENT, PREDOMINANTLY PMN NO SQUAMOUS EPITHELIAL CELLS SEEN NO ORGANISMS SEEN Performed at [facility]    Culture   Final    Non-Pathogenic Oropharyngeal-type Flora Isolated. Performed at [facility]    Report Status 02/02/2015 FINAL  Final  Fungus Culture with Smear     Status: None (Preliminary result)   Collection Time: 02/06/15  7:28 AM  Result Value Ref Range Status   Specimen Description BRONCHIAL ALVEOLAR LAVAGE  Final   Special Requests Normal  Final   Fungal Smear   Final    NO YEAST OR FUNGAL ELEMENTS SEEN Performed at [facility]    Culture   Final    CULTURE IN PROGRESS FOR FOUR WEEKS Performed at [facility]    Report Status PENDING  Incomplete  Culture, respiratory (NON-Expectorated)     Status: None   Collection Time: 02/06/15  7:29 AM  Result Value Ref Range Status   Specimen Description TRACHEAL ASPIRATE  Final   Special Requests Normal  Final   Gram Stain   Final    NO WBC SEEN NO SQUAMOUS EPITHELIAL CELLS SEEN NO ORGANISMS SEEN Performed at [facility]    Culture   Final    NO GROWTH 2 DAYS Performed at [facility]    Report Status 02/08/2015 FINAL  Final     Principal Problem:   Acute respiratory failure with hypoxia Active Problems:   Atrial fibrillation   Obesity   Critical aortic valve stenosis   Pulmonary hypertension   COPD (chronic obstructive pulmonary disease)    Severe aortic stenosis   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 S/p successful extubation 02/07/2015; . She might hae ABPA  V HP due to mold exposure (2013 CT chest). However, 02/06/15: Eos currently normal. IgE 02/06/15 pre-steroids - normal. So doubt ABPA. Curently 01/25/15 does not show classic HP features but is insteresting that current steroids helped her get extubated  Plan: Await HP panel  Continue pulmonary toilette measures again today (mucomyst x24 hours, guaifenesin, chest PT, bag lavage) Continue solumedrol  daily and nebulized steroid; wil probably do slow taper over 2 weeks   - typically for HP long coruses in months are needed but if she flares up after current taper in 2 weeks then we can committ to longer steroid course Monitor off abx Await  tracheal wash for fungal culture and bacterial culture 02/06/15 - negative as of 02/10/2015    Family 02/06/15: PCCM MD  dated her cousin Gerri Spore vanMeter by phone. PCCM MD updated patient at bedside 02/10/2015      PCCM will follow . Can go to SDU   Dr. Kalman Shan, M.D., [facility].C.P Pulmonary and Critical Care Medicine Staff Physician [facility] [place] Pulmonary and Critical [facility] Pager: [phone], If no answer or between  15:00h - 7:00h: call [phone]  02/10/2015 6:36 PM

## 2015-02-10 NOTE — Progress Notes (Addendum)
Nutrition Follow-up  DOCUMENTATION CODES:  Morbid obesity  INTERVENTION:  Continue Vital AF 1.2 formula at goal rate of 70 ml/hr   TF regimen to provide 2016 kcals, 126 gm protein, 1362 ml of free water  NUTRITION DIAGNOSIS:  Inadequate oral intake related to dysphagia as evidenced by NPO status, ongoing  GOAL:  Patient will meet greater than or equal to 90% of their needs, met  MONITOR:  TF tolerance, Diet advancement, PO intake, Labs, Weight trends, I & O's  ASSESSMENT: 74 yo Female developed progressive dyspnea/hypoxia from AECOPD and pulmonary edema/pleural effusions after AVR with MAZE on 5/18.  Patient s/p procedure 5/31: AORTIC VALVE REPLACEMENT (AVR)  MAZE PROCEDURE AORTIC ROOT ENLARGEMENT  Pt extubated 6/6.  Vital AF 1.2 formula infusing via small bore feeding tube (tip in proximal stomach) at goal rate of 70 ml/hr; providing 2016 kcals, 126 gm protein, 1362 ml of free water.  Free water flushes at 200 ml every 6 hours.  Noted patient upset/uncomfortable with feeding tube.  Some loose stools.  Swallow re-evaluation pending.  Height:  Ht Readings from Last 1 Encounters:  01/20/15 _0  (1.575 m)    Weight:  Wt Readings from Last 1 Encounters:  02/10/15 230 lb (104.327 kg)    Ideal Body Weight:  50 kg  Wt Readings from Last 10 Encounters:  02/10/15 230 lb (104.327 kg)  01/17/15 247 lb (112.038 kg)  01/17/15 237 lb 11.2 oz (107.82 kg)  12/27/14 231 lb (104.781 kg)  11/22/14 240 lb (108.863 kg)  11/01/14 240 lb (108.863 kg)  10/04/14 241 lb 9.6 oz (109.589 kg)  09/11/14 240 lb 11.2 oz (109.181 kg)  08/16/14 249 lb (112.946 kg)  05/12/14 249 lb (112.946 kg)    BMI:  Body mass index is 42.06 kg/(m^2).  Estimated Nutritional Needs:  Kcal:  2000-2200  Protein:  110-120 gm  Fluid:  per MD  Skin:  Wound (see comment) (Stage I to bilateral ears)  Diet Order:  Diet NPO time specified  EDUCATION NEEDS:  No education needs identified at  this time   Intake/Output Summary (Last 24 hours) at 02/10/15 1109 Last data filed at 02/10/15 0800  Gross per 24 hour  Intake   1710 ml  Output   1101 ml  Net    609 ml    Last BM:  6/8  Arthur Holms, RD, LDN Pager #: [phone] After-Hours Pager #: [phone]

## 2015-02-10 NOTE — Progress Notes (Signed)
Patient ID: Sandra Turner, female   DOB: 1941-04-13, 74 y.o.   MRN: [id number]  SICU Evening rounds:  Hemodynamically stable in sinus rhythm.  Ambulated today.  Did not pass swallowing eval today so still NPO on tube feeds.  Urine output ok

## 2015-02-11 LAB — BASIC METABOLIC PANEL
Anion gap: 11 (ref 5–15)
BUN: 52 mg/dL — AB (ref 6–20)
CO2: 36 mmol/L — AB (ref 22–32)
CREATININE: 1.08 mg/dL — AB (ref 0.44–1.00)
Calcium: 8.7 mg/dL — ABNORMAL LOW (ref 8.9–10.3)
Chloride: 100 mmol/L — ABNORMAL LOW (ref 101–111)
GFR, EST AFRICAN AMERICAN: 58 mL/min — AB (ref >60)
GFR, EST NON AFRICAN AMERICAN: 50 mL/min — AB (ref >60)
Glucose, Bld: 122 mg/dL — ABNORMAL HIGH (ref 65–99)
Potassium: 3.6 mmol/L (ref 3.5–5.1)
SODIUM: 147 mmol/L — AB (ref 135–145)

## 2015-02-11 LAB — GLUCOSE, CAPILLARY
GLUCOSE-CAPILLARY: 104 mg/dL — AB (ref 65–99)
Glucose-Capillary: 127 mg/dL — ABNORMAL HIGH (ref 65–99)
Glucose-Capillary: 136 mg/dL — ABNORMAL HIGH (ref 65–99)
Glucose-Capillary: 159 mg/dL — ABNORMAL HIGH (ref 65–99)
Glucose-Capillary: 136 mg/dL — ABNORMAL HIGH (ref 65–99)

## 2015-02-11 LAB — PROTIME-INR
INR: 1.53 — AB (ref 0.00–1.49)
Prothrombin Time: 18.4 seconds — ABNORMAL HIGH (ref 11.6–15.2)

## 2015-02-11 LAB — HYPERSENSITIVITY PNEUMONITIS
A. Pullulans Abs: NEGATIVE
A.Fumigatus #1 Abs: NEGATIVE
Micropolyspora faeni, IgG: NEGATIVE
PIGEON SERUM ABS: NEGATIVE
THERMOACT. SACCHARII: NEGATIVE
THERMOACTINOMYCES VULGARIS IGG: NEGATIVE

## 2015-02-11 MED ORDER — TRAMADOL HCL 50 MG PO TABS
50.0000 mg | ORAL_TABLET | ORAL | Status: DC | PRN
  Administered 2015-02-11 – 2015-02-18 (×15): 50 mg
  Filled 2015-02-11 (×15): qty 1

## 2015-02-11 MED ORDER — FUROSEMIDE 10 MG/ML IJ SOLN
20.0000 mg | Freq: Once | INTRAMUSCULAR | Status: AC
  Administered 2015-02-11: 20 mg via INTRAVENOUS
  Filled 2015-02-11: qty 2

## 2015-02-11 MED ORDER — POTASSIUM CHLORIDE 10 MEQ/50ML IV SOLN
10.0000 meq | INTRAVENOUS | Status: AC | PRN
  Administered 2015-02-11 (×3): 10 meq via INTRAVENOUS
  Filled 2015-02-11 (×3): qty 50

## 2015-02-11 MED ORDER — TRAMADOL 5 MG/ML ORAL SUSPENSION: 50.0000 mg | ORAL | Status: DC | PRN

## 2015-02-11 MED ORDER — TRAMADOL 5 MG/ML ORAL SUSPENSION: 50.0000 mg | ORAL | Status: DC

## 2015-02-11 MED ORDER — WARFARIN SODIUM 2.5 MG PO TABS
2.5000 mg | ORAL_TABLET | Freq: Every day | ORAL | Status: DC
  Administered 2015-02-11 – 2015-02-13 (×3): 2.5 mg
  Filled 2015-02-11 (×5): qty 1

## 2015-02-11 NOTE — Progress Notes (Signed)
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  10/2014 Serum eosinophil count notably elevated at 500cell/microliter. Bronch today with significant tracheomalacia and acute inflammation and thick, glue like secretions.  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 EOS checked - normal. IgE - pending. S/p bronch. STARted on IV steroids  02/07/15: HX as above from Dr Kendrick Fries over phone. Reviewe of 2013 CT Chest - ? ABPA v HP.  Wil send HP Panel Currently doing SBT - Dr Cornelius Moras feels patient is looking as good as shehas gotten so far. Patient feels she is ready for extubation    02/08/15: s/p extubation yesterday. Now sittiing in chair. Feels well. Voice hoarse.RN says that doing well on liquids but needs FEES. She is watching TV. 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 Last 3 years 3 admits for resp issues dx as "pneumonia". Rx with abx and steroids with resolution per hx  02/10/15:  Again failed swallow per RN. PEr chart review of speech Rx notes: left ayrtenoid did not adduct and cord folds are not making contact. Also, posterior vocal cord fold uler +. Prolonged recovery anticipated. Needin 2L [place] otherwise desats per RN. Did walk with PT and in room per RN. Edema + Paient reports fatigue and frustration with her medical course    SUBJECTIVE/OVERNIGHT/INTERVAL HX 02/11/15:  Refuses PEG. Contenti with feeding tube.   Per RN: no overnight events. She remails on RA . Walked 22 feet yesterday with PT. Voice still weak and hoarse.  Good coughj   CVTS note review: ok for SDU. To remain strick NPO    O: Filed Vitals:   02/11/15 0744 02/11/15 0800 02/11/15 0826 02/11/15 0932  BP:  126/49    Pulse:  71    Temp:   98.1 F (36.7 C)   TempSrc:   Oral   Resp:  23    Height:      Weight:      SpO2: 97% 96%  95%       Gen:  Watching TV, sitting  in chair HENT: NCAT, Voice hoarse PULM: no wheezing. No distress. Occ scattered crackles at base CHEST - median sternotomy scar with bruising + CV: RRR, murmur + -  GI: BS+, soft, nontender Derm: left hand bruising better, hand warm with normal cap refill Neuro: follows commands, normal conversation, sitting in chair,  EXT: bialteral edema +. NO  Clubbing. No edema   PULMONARY  Recent Labs Lab 02/05/15 0442  PHART 7.507*  PCO2ART 50.1*  PO2ART 85.0  HCO3 39.8*  TCO2 41  O2SAT 97.0    CBC  Recent Labs Lab 02/07/15 0519 02/09/15 0520 02/10/15 0410  HGB 9.5* 9.8* 9.7*  HCT 30.7* 32.4* 31.8*  WBC 11.6* 15.8* 14.7*  PLT 156 190 157    COAGULATION  Recent Labs Lab 02/07/15 0519 02/08/15 0449 02/09/15 0520 02/10/15 0410 02/11/15 0600  INR 1.67* 1.73* 2.23* 1.85* 1.53*    CARDIAC  No results for input(s): TROPONINI in the last 168 hours. No results for input(s): PROBNP in the last 168 hours.   CHEMISTRY  Recent Labs Lab 02/07/15 0519 02/08/15 0449 02/09/15 0520 02/10/15 0410 02/11/15 0600  NA 146* 148* 146* 147* 147*  K 4.3 4.4 4.1 3.8 3.6  CL 97* 100* 100* 102 100*  CO2 40* 39* 35* 36* 36*  GLUCOSE 123* 109* 121* 137* 122*  BUN 57* 49* 49* 51* 52*  CREATININE 1.23* 1.19* 1.17* 1.15* 1.08*  CALCIUM 8.9 9.1 8.8* 8.7* 8.7*   Estimated Creatinine Clearance: 53.5 mL/min (by C-G formula based on Cr of 1.08).   LIVER  Recent Labs Lab 02/07/15 0519 02/08/15 0449 02/09/15 0520 02/10/15 0410 02/11/15 0600  AST 55*  --   --   --   --   ALT 48  --   --   --   --   ALKPHOS 106  --   --   --   --   BILITOT 1.0  --   --   --   --   PROT 5.6*  --   --   --   --   ALBUMIN 2.5*  --   --   --   --   INR 1.67* 1.73* 2.23* 1.85* 1.53*     INFECTIOUS No results for input(s): LATICACIDVEN, PROCALCITON in the last 168 hours.   ENDOCRINE CBG (last 3)   Recent Labs  02/10/15 2337 02/11/15 0343 02/11/15 0824  GLUCAP 150* 127* 104*          IMAGING x48h  Dg Chest Port 1 View  02/10/2015   CLINICAL DATA:  Atelectasis  EXAM: PORTABLE CHEST - 1 VIEW  COMPARISON:  02/09/2015  FINDINGS: Bilateral chest tubes have been removed.  No pneumothorax  Right arm PICC tip in the SVC.  Feeding tube in the stomach.  Hypoventilation with bibasilar atelectasis. Slight increase in right lower lobe atelectasis since the prior study. Small right effusion has developed since the prior study. Negative for pulmonary edema.  IMPRESSION: Bilateral chest tube removal without pneumothorax  Mild increase in right lower lobe atelectasis and small right effusion.   Electronically Signed   By: Marlan Palau M.D.   On: 02/10/2015 07:18   CT 2013 - micronodules -    Results for orders placed or performed during the hospital encounter of 01/19/15  Culture, respiratory (NON-Expectorated)     Status: None   Collection Time: 01/31/15  1:15 PM  Result Value Ref Range Status   Specimen Description TRACHEAL ASPIRATE  Final   Special Requests NONE  Final   Gram Stain   Final    RARE WBC PRESENT, PREDOMINANTLY PMN NO SQUAMOUS EPITHELIAL CELLS SEEN NO ORGANISMS SEEN Performed at [facility]    Culture   Final    Non-Pathogenic Oropharyngeal-type Flora Isolated. Performed at [facility]    Report Status 02/02/2015 FINAL  Final  Fungus Culture with Smear     Status: None (Preliminary result)   Collection Time: 02/06/15  7:28 AM  Result Value Ref Range Status   Specimen Description BRONCHIAL ALVEOLAR LAVAGE  Final   Special Requests Normal  Final   Fungal Smear   Final    NO YEAST OR FUNGAL ELEMENTS SEEN Performed at [facility]    Culture   Final    CULTURE IN PROGRESS FOR FOUR WEEKS Performed at [facility]    Report Status PENDING  Incomplete  Culture, respiratory (NON-Expectorated)     Status: None   Collection Time: 02/06/15  7:29 AM  Result Value Ref Range Status   Specimen Description TRACHEAL  ASPIRATE  Final   Special Requests Normal  Final   Gram Stain   Final    NO WBC SEEN NO SQUAMOUS EPITHELIAL CELLS SEEN NO ORGANISMS SEEN Performed at [facility]    Culture   Final    NO  GROWTH 2 DAYS Performed at [facility]    Report Status 02/08/2015 FINAL  Final     Principal Problem:   Acute respiratory failure with hypoxia Active Problems:   Atrial fibrillation   Obesity   Critical aortic valve stenosis   Pulmonary hypertension   COPD (chronic obstructive pulmonary disease)   Severe aortic stenosis   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 S/p successful extubation 02/07/2015; . She might hae ABPA  V HP due to mold exposure based on 2013 CT chest. However, 02/06/15: Eos currently normal. IgE 02/06/15 pre-steroids - normal. So doubt ABPA.   Curently 01/25/15 does not show classic HP features but is insteresting that current steroids helped her get extubated and with 2013 CT showing HP features - this has to be kept in deifferential;  Plan: Await HP panel  Continue pulmonary toilette measures again today (mucomyst x24 hours, guaifenesin, chest PT, bag lavage) Continue solumedrol  daily and nebulized steroid;  do slow taper over 2 weeks starting 02/12/15  - typically for HP long coruses in months are needed but if she flares up after current taper in 2 weeks then we can committ to longer steroid course Monitor off abx Await  tracheal wash for fungal culture and bacterial culture 02/06/15 - negative as of 02/11/2015   #EDema  - peristent 02/11/2015 - try 1 dose lasix 6./10/16 - reassess bnp  02/12/15  #Hypernatermia  - continue free water - trach    Family 02/06/15: PCCM MD  dated her cousin Gerri Spore vanMeter by phone. PCCM MD updated patient at bedside 02/11/2015      PCCM will follow - atleast 1 over weekend . Can go to SDU   Dr. Kalman Shan, M.D., [facility].C.P Pulmonary and Critical Care Medicine Staff Physician [facility] [place] [facility]monary and Critical Care Pager: [phone], If no answer or between  15:00h - 7:00h: call [phone]  02/11/2015 9:56 AM

## 2015-02-11 NOTE — Progress Notes (Signed)
      [address]       [name] [address]             [phone]        CARDIOTHORACIC SURGERY PROGRESS NOTE   R23 Days Post-Op Procedure(s) (LRB): AORTIC VALVE REPLACEMENT (AVR) (N/A) MAZE (N/A)  AORTIC ROOT ENLARGEMENT (N/A) TRANSESOPHAGEAL ECHOCARDIOGRAM (TEE) (N/A)  Subjective: Reports feeling quite well.  No pain.  No SOB.  Voice still very weak, hoarse.  Objective: Vital signs: BP Readings from Last 1 Encounters:  02/11/15 136/63   Pulse Readings from Last 1 Encounters:  02/11/15 72   Resp Readings from Last 1 Encounters:  02/11/15 16   Temp Readings from Last 1 Encounters:  02/11/15 97.4 F (36.3 C) Oral    Hemodynamics:    Physical Exam:  Rhythm:   sinus  Breath sounds: clear  Heart sounds:  RRR  Incisions:  Clean and dry  Abdomen:  Soft, non-distended, non-tender  Extremities:  Warm, well-perfused    Intake/Output from previous day: 06/09 0701 - 06/10 0700 In: 2080 [NG/GT:2080] Out: 850 [Urine:850] Intake/Output this shift:    Lab Results:  CBC: Recent Labs  02/09/15 0520 02/10/15 0410  WBC 15.8* 14.7*  HGB 9.8* 9.7*  HCT 32.4* 31.8*  PLT 190 157    BMET:  Recent Labs  02/10/15 0410 02/11/15 0600  NA 147* 147*  K 3.8 3.6  CL 102 100*  CO2 36* 36*  GLUCOSE 137* 122*  BUN 51* 52*  CREATININE 1.15* 1.08*  CALCIUM 8.7* 8.7*     PT/INR:   Recent Labs  02/11/15 0600  LABPROT 18.4*  INR 1.53*    CBG (last 3)   Recent Labs  02/10/15 1918 02/10/15 2337 02/11/15 0343  GLUCAP 150* 150* 127*    ABG    Component Value Date/Time   PHART 7.507* 02/05/2015 0442   PCO2ART 50.1* 02/05/2015 0442   PO2ART 85.0 02/05/2015 0442   HCO3 39.8* 02/05/2015 0442   TCO2 41 02/05/2015 0442   ACIDBASEDEF TEST WILL BE CREDITED 01/20/2015 0930   O2SAT 97.0 02/05/2015 0442    CXR: n/a  Assessment/Plan: S/P Procedure(s) (LRB): AORTIC VALVE REPLACEMENT (AVR) (N/A) MAZE (N/A)  AORTIC ROOT ENLARGEMENT  (N/A) TRANSESOPHAGEAL ECHOCARDIOGRAM (TEE) (N/A)  Overall doing quite well but still w/ severe swallowing dysfunction, presumably related to prolonged ET intubation.  Patient is comfortable w/ Panda tube in place and hopes to avoid PEG.  Otherwise doing quite well.  Respiratory status very stable, much improved.  Hyponatremia stable.  Lasix still on hold.  Receiving free H20 with tube feeds.  Transfer step-down today.  Continue PT.  Remain strict NPO  Purcell Nails 02/11/2015 8:01 AM

## 2015-02-11 NOTE — Progress Notes (Signed)
Transferred to 2W23 via wheelchair. Portable monitor on. No changes. Report given to [place], [place]

## 2015-02-11 NOTE — Progress Notes (Signed)
1400 Offered to walk with pt but she declined. Stated she had been to [facility] many times and tired. Stated she needed to get cleaned up and then respiratory was coming. Stated would walk with [place] tomorrow. Luetta Nutting RN BSN 02/11/2015 2:28 PM

## 2015-02-11 NOTE — Clinical Social Work Note (Signed)
Patient has Medicare and Medicaid, this has been verified by [facility] in [place], which patient would like to discharge to once she is medically ready and discharge orders have been received.  CSW continuing to follow patient's progress towards discharge.  Ervin Knack. Sundeep Cary, MSW, Theresia Majors [phone] 02/11/2015 8:41 AM

## 2015-02-11 NOTE — Progress Notes (Signed)
Utilization review completed.  

## 2015-02-11 NOTE — Progress Notes (Signed)
Speech Language Pathology Treatment: Dysphagia  Patient Details Name: Nakema Brunette MRN: [id number] DOB: November 06, 1940 Today's Date: 02/11/2015 Time: 1420-1440 SLP Time Calculation (min) (ACUTE ONLY): 20 min  Assessment / Plan / Recommendation Clinical Impression  Provided initial instruction with laryngeal adduction exercises. Pt able to return demonstrate each with verbal and then just with written instruction. Pt to continue independent exercises over the weekend. Will f/u to assess progress via subjective observation of voice and cough.    HPI Other Pertinent Information: Pt is a 74 year old female who underwent AVR on 5/18. 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    Pertinent Vitals    SLP Plan  Continue with current plan of care    Recommendations Diet recommendations: NPO              Plan: Continue with current plan of care    GO    [facility], [place] CCC-SLP [phone]  Claudine Mouton 02/11/2015, 2:48 PM

## 2015-02-12 ENCOUNTER — Inpatient Hospital Stay (HOSPITAL_COMMUNITY): Payer: Medicare Other

## 2015-02-12 DIAGNOSIS — R918 Other nonspecific abnormal finding of lung field: Secondary | ICD-10-CM

## 2015-02-12 DIAGNOSIS — J438 Other emphysema: Secondary | ICD-10-CM

## 2015-02-12 LAB — GLUCOSE, CAPILLARY
GLUCOSE-CAPILLARY: 116 mg/dL — AB (ref 65–99)
GLUCOSE-CAPILLARY: 150 mg/dL — AB (ref 65–99)
GLUCOSE-CAPILLARY: 173 mg/dL — AB (ref 65–99)
Glucose-Capillary: 134 mg/dL — ABNORMAL HIGH (ref 65–99)

## 2015-02-12 LAB — BASIC METABOLIC PANEL
ANION GAP: 11 (ref 5–15)
BUN: 51 mg/dL — AB (ref 6–20)
CALCIUM: 8.9 mg/dL (ref 8.9–10.3)
CO2: 35 mmol/L — ABNORMAL HIGH (ref 22–32)
CREATININE: 1.05 mg/dL — AB (ref 0.44–1.00)
Chloride: 101 mmol/L (ref 101–111)
GFR, EST AFRICAN AMERICAN: 60 mL/min — AB (ref >60)
GFR, EST NON AFRICAN AMERICAN: 52 mL/min — AB (ref >60)
Glucose, Bld: 109 mg/dL — ABNORMAL HIGH (ref 65–99)
Potassium: 3.8 mmol/L (ref 3.5–5.1)
Sodium: 147 mmol/L — ABNORMAL HIGH (ref 135–145)

## 2015-02-12 LAB — PROTIME-INR
INR: 1.49 (ref 0.00–1.49)
Prothrombin Time: 18.1 seconds — ABNORMAL HIGH (ref 11.6–15.2)

## 2015-02-12 LAB — CBC
HEMATOCRIT: 31.3 % — AB (ref 36.0–46.0)
HEMOGLOBIN: 9.4 g/dL — AB (ref 12.0–15.0)
MCH: 29 pg (ref 26.0–34.0)
MCHC: 30 g/dL (ref 30.0–36.0)
MCV: 96.6 fL (ref 78.0–100.0)
PLATELETS: 136 10*3/uL — AB (ref 150–400)
RBC: 3.24 MIL/uL — ABNORMAL LOW (ref 3.87–5.11)
RDW: 15.4 % (ref 11.5–15.5)
WBC: 14.5 10*3/uL — ABNORMAL HIGH (ref 4.0–10.5)

## 2015-02-12 LAB — BRAIN NATRIURETIC PEPTIDE: B Natriuretic Peptide: 271.9 pg/mL — ABNORMAL HIGH (ref 0.0–100.0)

## 2015-02-12 LAB — MAGNESIUM: Magnesium: 2.5 mg/dL — ABNORMAL HIGH (ref 1.7–2.4)

## 2015-02-12 LAB — PHOSPHORUS: Phosphorus: 3.2 mg/dL (ref 2.5–4.6)

## 2015-02-12 NOTE — Progress Notes (Addendum)
[address]       [place] [address]             [phone]          24 Days Post-Op Procedure(s) (LRB): AORTIC VALVE REPLACEMENT (AVR) (N/A) MAZE (N/A)  AORTIC ROOT ENLARGEMENT (N/A) TRANSESOPHAGEAL ECHOCARDIOGRAM (TEE) (N/A)  Subjective: C/o multiple loose stools overnight. Just feels weak.  Breathing stable.   Objective: Vital signs in last 24 hours: Patient Vitals for the past 24 hrs:  BP Temp Temp src Pulse Resp SpO2 Weight  02/12/15 0636 - - - - - - 232 lb 12.9 oz (105.6 kg)  02/12/15 0634 (!) 148/58 mmHg 97.8 F (36.6 C) Oral 68 20 99 % -  02/12/15 0224 - - - - - 97 % -  02/12/15 0219 - - - - - 97 % -  02/11/15 2029 (!) 134/59 mmHg 97.8 F (36.6 C) Axillary 69 - 100 % -  02/11/15 2026 - - - - - 97 % -  02/11/15 1500 (!) 126/47 mmHg 98.7 F (37.1 C) Oral 75 20 92 % -  02/11/15 1114 (!) 142/48 mmHg - - - - - -  02/11/15 1110 (!) 128/103 mmHg 98.1 F (36.7 C) Oral 72 (!) 22 97 % -   Current Weight  02/12/15 232 lb 12.9 oz (105.6 kg)  BASELINE WEIGHT:112 kg   Intake/Output from previous day: 06/10 0701 - 06/11 0700 In: 760 [NG/GT:610; IV Piggyback:150] Out: 915 [Urine:900; Emesis/NG output:15]    PHYSICAL EXAM:  Heart: Irr irr Lungs: Slightly decreased BS in bases, no wheezes or rhonchi Wound: Clean and dry Extremities: +LE edema Abdomen: Soft, NT/ND, +BS    Lab Results: CBC: Recent Labs  02/10/15 0410 02/12/15 0435  WBC 14.7* 14.5*  HGB 9.7* 9.4*  HCT 31.8* 31.3*  PLT 157 136*   BMET:  Recent Labs  02/11/15 0600 02/12/15 0435  NA 147* 147*  K 3.6 3.8  CL 100* 101  CO2 36* 35*  GLUCOSE 122* 109*  BUN 52* 51*  CREATININE 1.08* 1.05*  CALCIUM 8.7* 8.9    PT/INR:  Recent Labs  02/12/15 0435  LABPROT 18.1*  INR 1.49   CXR: FINDINGS: Sequelae of prior aortic valve replacement and left atrial appendage clipping are again identified. Enteric tube courses into the left upper abdomen with tip not  imaged. Cardiomediastinal silhouette is unchanged. Lateral image is limited as patient was unable to raise her arms. Small bilateral pleural effusions and bibasilar atelectasis are similar to the prior study. There is new platelike atelectasis in the right midlung. No pneumothorax is identified.  IMPRESSION: Similar appearance of small bilateral pleural effusions and bibasilar atelectasis.   Assessment/Plan: S/P Procedure(s) (LRB): AORTIC VALVE REPLACEMENT (AVR) (N/A) MAZE (N/A)  AORTIC ROOT ENLARGEMENT (N/A) TRANSESOPHAGEAL ECHOCARDIOGRAM (TEE) (N/A)  Acute resp failure- Pulm/CCM following, continue steroid taper, pulm toilet, etc.  Hypernatremia- Na stable, continue free H20.  CV- BPs a little elevated, Norvasc increased.  Will continue to watch and may need to increase dose further.  Coumadin loading.  Remains in rate controlled AF.  Vol overload- BNP 217, but remains edematous.  May benefit from further diuresis.  Dysphagia- Tolerating Panda feeds. Continue NPO for now.  Deconditioning- Continue PT/CRPI.  GI- loose stools may be due to tube feeds.  Will d/c laxatives, stool softeners, etc. And watch. She has been off abx for a few days, but if diarrhea persists, will check stool for C diff.  LOS: 24 days    COLLINS,GINA H 02/12/2015  Over stable, holding stool softeners BUN/cr  51/1.05 inr 1.49 I have seen and examined Cheyenne Adas and agree with the above assessment  and plan.  Delight Ovens MD Beeper [phone] Office [phone] 02/12/2015 1:24 PM

## 2015-02-12 NOTE — Progress Notes (Signed)
Offered to walk with pt however pt sts she just can't today. Sts she has been up and down going to [facility] with diarrhea 9 times since last night. Sts she is too tired and that she feels she is getting enough activity with that. Will f/u Monday. Ethelda Chick CES, ACSM 1:25 PM 02/12/2015

## 2015-02-12 NOTE — Progress Notes (Signed)
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  10/2014 Serum eosinophil count notably elevated at 500cell/microliter. Bronch today with significant tracheomalacia and acute inflammation and thick, glue like secretions.  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 EOS checked - normal. IgE - pending. S/p bronch. STARted on IV steroids  02/07/15: HX as above from Dr Kendrick Fries over phone. Reviewe of 2013 CT Chest - ? ABPA v HP.  Wil send HP Panel Currently doing SBT - Dr Cornelius Moras feels patient is looking as good as shehas gotten so far. Patient feels she is ready for extubation    02/08/15: s/p extubation yesterday. Now sittiing in chair. Feels well. Voice hoarse.RN says that doing well on liquids but needs FEES. She is watching TV. 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 Last 3 years 3 admits for resp issues dx as "pneumonia". Rx with abx and steroids with resolution per hx  02/10/15:  Again failed swallow per RN. PEr chart review of speech Rx notes: left ayrtenoid did not adduct and cord folds are not making contact. Also, posterior vocal cord fold uler +. Prolonged recovery anticipated. Needin 2L [place] otherwise desats per RN. Did walk with PT and in room per RN. Edema + Paient reports fatigue and frustration with her medical course    SUBJECTIVE/OVERNIGHT/INTERVAL HX Stable overnight.  Still weak, but no increased wob during my visit.  OCeasar Mons Vitals:   02/12/15 0219 02/12/15 0224 02/12/15 0634 02/12/15 0636  BP:   148/58   Pulse:   68   Temp:   97.8 F (36.6 C)   TempSrc:   Oral   Resp:   20   Height:      Weight:    105.6 kg (232 lb 12.9 oz)  SpO2: 97% 97% 99%        Gen:  Ow female sitting up in chair and in no distress HENT: nose without purulence, neck with hardware in place PULM: mild bibasilar crackles, no wheezing. 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  Question has been raised whether she could have HP.  Panel has been send.  The consensus is that she is better since being on steroids.  Currently stable on low flow oxygen and looks comfortable.  Plan:  Await HP panel  Continue pulmonary toilet Wean solumedrol Monitor off abx

## 2015-02-13 LAB — CBC
HEMATOCRIT: 29.1 % — AB (ref 36.0–46.0)
HEMOGLOBIN: 9.1 g/dL — AB (ref 12.0–15.0)
MCH: 30.2 pg (ref 26.0–34.0)
MCHC: 31.3 g/dL (ref 30.0–36.0)
MCV: 96.7 fL (ref 78.0–100.0)
PLATELETS: 128 10*3/uL — AB (ref 150–400)
RBC: 3.01 MIL/uL — AB (ref 3.87–5.11)
RDW: 15.7 % — ABNORMAL HIGH (ref 11.5–15.5)
WBC: 13.2 10*3/uL — ABNORMAL HIGH (ref 4.0–10.5)

## 2015-02-13 LAB — BASIC METABOLIC PANEL
Anion gap: 7 (ref 5–15)
BUN: 50 mg/dL — ABNORMAL HIGH (ref 6–20)
CALCIUM: 8.7 mg/dL — AB (ref 8.9–10.3)
CO2: 36 mmol/L — AB (ref 22–32)
Chloride: 100 mmol/L — ABNORMAL LOW (ref 101–111)
Creatinine, Ser: 1.03 mg/dL — ABNORMAL HIGH (ref 0.44–1.00)
GFR calc non Af Amer: 53 mL/min — ABNORMAL LOW (ref >60)
Glucose, Bld: 142 mg/dL — ABNORMAL HIGH (ref 65–99)
Potassium: 4.1 mmol/L (ref 3.5–5.1)
Sodium: 143 mmol/L (ref 135–145)

## 2015-02-13 LAB — GLUCOSE, CAPILLARY
GLUCOSE-CAPILLARY: 144 mg/dL — AB (ref 65–99)
Glucose-Capillary: 108 mg/dL — ABNORMAL HIGH (ref 65–99)
Glucose-Capillary: 111 mg/dL — ABNORMAL HIGH (ref 65–99)
Glucose-Capillary: 127 mg/dL — ABNORMAL HIGH (ref 65–99)
Glucose-Capillary: 137 mg/dL — ABNORMAL HIGH (ref 65–99)
Glucose-Capillary: 146 mg/dL — ABNORMAL HIGH (ref 65–99)
Glucose-Capillary: 154 mg/dL — ABNORMAL HIGH (ref 65–99)

## 2015-02-13 LAB — MAGNESIUM: Magnesium: 2.5 mg/dL — ABNORMAL HIGH (ref 1.7–2.4)

## 2015-02-13 LAB — PHOSPHORUS: Phosphorus: 3.8 mg/dL (ref 2.5–4.6)

## 2015-02-13 LAB — PROTIME-INR
INR: 1.42 (ref 0.00–1.49)
Prothrombin Time: 17.4 seconds — ABNORMAL HIGH (ref 11.6–15.2)

## 2015-02-13 NOTE — Progress Notes (Addendum)
       [address]       [place] [address]             [phone]          25 Days Post-Op Procedure(s) (LRB): AORTIC VALVE REPLACEMENT (AVR) (N/A) MAZE (N/A)  AORTIC ROOT ENLARGEMENT (N/A) TRANSESOPHAGEAL ECHOCARDIOGRAM (TEE) (N/A)  Subjective: Continues to report loose stools.  Breathing stable, no other new complaints.   Objective: Vital signs in last 24 hours: Patient Vitals for the past 24 hrs:  BP Temp Temp src Pulse Resp SpO2 Weight  02/13/15 0525 (!) 128/48 mmHg 98.2 F (36.8 C) Oral 69 18 99 % 233 lb 11 oz (106 kg)  02/13/15 0200 - - - - - 94 % -  02/13/15 0154 - - - - - 94 % -  02/12/15 2047 - - - - - 95 % -  02/12/15 2042 (!) 123/46 mmHg 98.5 F (36.9 C) Oral 71 18 96 % -  02/12/15 2040 - - - - - 95 % -  02/12/15 1522 (!) 126/53 mmHg 97.3 F (36.3 C) Oral 71 18 94 % -  02/12/15 1352 - - - - - 96 % -   Current Weight  02/13/15 233 lb 11 oz (106 kg)   BASELINE WEIGHT:112 kg  Intake/Output from previous day:   CBGs [id number]   PHYSICAL EXAM:  Heart: Irr irr Lungs: Diminished BS in bases, no wheezes or rhonchi Wound: Clean and dry Extremities: +LE edema    Lab Results: CBC: Recent Labs  02/12/15 0435 02/13/15 0500  WBC 14.5* 13.2*  HGB 9.4* 9.1*  HCT 31.3* 29.1*  PLT 136* 128*   BMET:  Recent Labs  02/12/15 0435 02/13/15 0500  NA 147* 143  K 3.8 4.1  CL 101 100*  CO2 35* 36*  GLUCOSE 109* 142*  BUN 51* 50*  CREATININE 1.05* 1.03*  CALCIUM 8.9 8.7*    PT/INR:  Recent Labs  02/13/15 0500  LABPROT 17.4*  INR 1.42   Lab Results  Component Value Date   INR 1.42 02/13/2015   INR 1.49 02/12/2015   INR 1.53* 02/11/2015     Assessment/Plan: S/P Procedure(s) (LRB): AORTIC VALVE REPLACEMENT (AVR) (N/A) MAZE (N/A)  AORTIC ROOT ENLARGEMENT (N/A) TRANSESOPHAGEAL ECHOCARDIOGRAM (TEE) (N/A)  Acute resp failure- Pulm/CCM following, continue steroid taper, pulm toilet, etc.  Hypernatremia- Na  improving, continue free H20.  CV- BPs better today, continue Norvasc. Coumadin loading. Remains in rate controlled AF.  Vol overload- BNP 217, but remains edematous. May benefit from further diuresis.  Dysphagia- Tolerating Panda feeds. Continue NPO for now.  Deconditioning- Continue PT/CRPI.  GI- Continues to report loose stools, may be due to tube feeds. Off all laxatives, stool softeners, etc. Since she was recently on abx, will check stool for C diff.  WBC actually trending down.   LOS: 25 days    COLLINS,GINA H 02/13/2015  Check cdiff today Voice slightly stronger inr 1.45 I have seen and examined Sandra Turner and agree with the above assessment  and plan.  Delight Ovens MD Beeper [phone] Office [phone] 02/13/2015 10:59 AM

## 2015-02-13 NOTE — Discharge Instructions (Addendum)
Information on my medicine - Coumadin   (Warfarin)  This medication education was reviewed with me or my healthcare representative as part of my discharge preparation.  The pharmacist that spoke with me during my hospital stay was:  COOPER, JULIETTE C, RPH  Why was Coumadin prescribed for you? Coumadin was prescribed for you because you have a blood clot or a medical condition that can cause an increased risk of forming blood clots. Blood clots can cause serious health problems by blocking the flow of blood to the heart, lung, or brain. Coumadin can prevent harmful blood clots from forming. As a reminder your indication for Coumadin is:   Blood Clot Prevention After Heart Valve Surgery  What test will check on my response to Coumadin? While on Coumadin (warfarin) you will need to have an INR test regularly to ensure that your dose is keeping you in the desired range. The INR (international normalized ratio) number is calculated from the result of the laboratory test called prothrombin time (PT).  If an INR APPOINTMENT HAS NOT ALREADY BEEN MADE FOR YOU please schedule an appointment to have this lab work done by your health care provider within 7 days. Your INR goal is usually a number between:  2 to 3 or your provider may give you a more narrow range like 2-2.5.  Ask your health care provider during an office visit what your goal INR is.  What  do you need to  know  About  COUMADIN? Take Coumadin (warfarin) exactly as prescribed by your healthcare provider about the same time each day.  DO NOT stop taking without talking to the doctor who prescribed the medication.  Stopping without other blood clot prevention medication to take the place of Coumadin may increase your risk of developing a new clot or stroke.  Get refills before you run out.  What do you do if you miss a dose? If you miss a dose, take it as soon as you remember on the same day then continue your regularly scheduled regimen the next  day.  Do not take two doses of Coumadin at the same time.  Important Safety Information A possible side effect of Coumadin (Warfarin) is an increased risk of bleeding. You should call your healthcare provider right away if you experience any of the following: ? Bleeding from an injury or your nose that does not stop. ? Unusual colored urine (red or dark brown) or unusual colored stools (red or black). ? Unusual bruising for unknown reasons. ? A serious fall or if you hit your head (even if there is no bleeding).  Some foods or medicines interact with Coumadin (warfarin) and might alter your response to warfarin. To help avoid this: ? Eat a balanced diet, maintaining a consistent amount of Vitamin K. ? Notify your provider about major diet changes you plan to make. ? Avoid alcohol or limit your intake to 1 drink for women and 2 drinks for men per day. (1 drink is 5 oz. wine, 12 oz. beer, or 1.5 oz. liquor.)  Make sure that ANY health care provider who prescribes medication for you knows that you are taking Coumadin (warfarin).  Also make sure the healthcare provider who is monitoring your Coumadin knows when you have started a new medication including herbals and non-prescription products.  Coumadin (Warfarin)  Major Drug Interactions  Increased Warfarin Effect Decreased Warfarin Effect  Alcohol (large quantities) Antibiotics (esp. Septra/Bactrim, Flagyl, Cipro) Amiodarone (Cordarone) Aspirin (ASA) Cimetidine (Tagamet) Megestrol (Megace) NSAIDs (  ibuprofen, naproxen, etc.) Piroxicam (Feldene) Propafenone (Rythmol SR) Propranolol (Inderal) Isoniazid (INH) Posaconazole (Noxafil) Barbiturates (Phenobarbital) Carbamazepine (Tegretol) Chlordiazepoxide (Librium) Cholestyramine (Questran) Griseofulvin Oral Contraceptives Rifampin Sucralfate (Carafate) Vitamin K   Coumadin (Warfarin) Major Herbal Interactions  Increased Warfarin Effect Decreased Warfarin Effect   Garlic Ginseng Ginkgo biloba Coenzyme Q10 Green tea St. Johns wort    Coumadin (Warfarin) FOOD Interactions  Eat a consistent number of servings per week of foods HIGH in Vitamin K (1 serving =  cup)  Collards (cooked, or boiled & drained) Kale (cooked, or boiled & drained) Mustard greens (cooked, or boiled & drained) Parsley *serving size only =  cup Spinach (cooked, or boiled & drained) Swiss chard (cooked, or boiled & drained) Turnip greens (cooked, or boiled & drained)  Eat a consistent number of servings per week of foods MEDIUM-HIGH in Vitamin K (1 serving = 1 cup)  Asparagus (cooked, or boiled & drained) Broccoli (cooked, boiled & drained, or raw & chopped) Brussel sprouts (cooked, or boiled & drained) *serving size only =  cup Lettuce, raw (green leaf, endive, romaine) Spinach, raw Turnip greens, raw & chopped   These websites have more information on Coumadin (warfarin):  http://www.king-russell.com/; https://www.hines.net/;  We ask the SNF to please do the following: 1. Please obtain vital signs at least one time daily 2.Please weigh the patient daily. If he or she continues to gain weight or develops lower extremity edema, contact the office at [phone]. 3. Ambulate patient at least three times daily and please use sternal precautions.   Activity: 1.May walk up steps                2.No lifting more than ten pounds for four weeks.                 3.No driving for four weeks.                4.Stop any activity that causes chest pain, shortness of breath, dizziness,    sweating or excessive weakness.                5.Avoid straining.                6.Continue with your breathing exercises daily.  Diet: Dysphagia III honey thick diet  Wound Care: May shower.  Clean wounds with mild soap and water daily. Contact the office at [phone] if any problems arise.  Aortic Valve Replacement, Care After Refer to this sheet in the next few weeks. These  instructions provide you with information on caring for yourself after your procedure. Your health care provider may also give you specific instructions. Your treatment has been planned according to current medical practices, but problems sometimes occur. Call your health care provider if you have any problems or questions after your procedure. HOME CARE INSTRUCTIONS   Take medicines only as directed by your health care provider.  If your health care provider has prescribed elastic stockings, wear them as directed.  Take frequent naps or rest often throughout the day.  Avoid lifting over 10 lbs (4.5 kg) or pushing or pulling things with your arms for 6-8 weeks or as directed by your health care provider.  Avoid driving or airplane travel for 4-6 weeks after surgery or as directed by your health care provider. If you are riding in a car for an extended period, stop every 1-2 hours to stretch your legs. Keep a record of your medicines and medical history with you when  traveling.  Do not drive or operate heavy machinery while taking pain medicine. (narcotics).  Do not cross your legs.  Do not use any tobacco products including cigarettes, chewing tobacco, or electronic cigarettes. If you need help quitting, ask your health care provider.  Do not take baths, swim, or use a hot tub until your health care provider approves. Take showers once your health care provider approves. Pat incisions dry. Do not rub incisions with a washcloth or towel.  Avoid climbing stairs and using the handrail to pull yourself up for the first 2-3 weeks after surgery.  Return to work as directed by your health care provider.  Drink enough fluid to keep your urine clear or pale yellow.  Do not strain to have a bowel movement. Eat high-fiber foods if you become constipated. You may also take a medicine to help you have a bowel movement (laxative) as directed by your health care provider.  Resume sexual activity as  directed by your health care provider. Men should not use medicines for erectile dysfunction until their doctor says it isokay.  If you had a certain type of heart condition in the past, you may need to take antibiotic medicine before having dental work or surgery. Let your dentist and health care providers know if you had one or more of the following:  Previous endocarditis.  An artificial (prosthetic) heart valve.  Congenital heart disease. SEEK MEDICAL CARE IF:  You develop a skin rash.   You experience sudden changes in your weight.  You have a fever. SEEK IMMEDIATE MEDICAL CARE IF:   You develop chest pain that is not coming from your incision.  You have drainage (pus), redness, swelling, or pain at your incision site.   You develop shortness of breath or have difficulty breathing.   You have increased bleeding from your incision site.   You develop light-headedness.  MAKE SURE YOU:   Understand these directions.  Will watch your condition.  Will get help right away if you are not doing well or get worse. Document Released: 03/08/2005 Document Revised: 01/04/2014 Document Reviewed: 06/03/2012 [facility] Patient Information 2015 [place], [place]. This information is not intended to replace advice given to you by your health care provider. Make sure you discuss any questions you have with your health care provider.

## 2015-02-14 DIAGNOSIS — J9601 Acute respiratory failure with hypoxia: Secondary | ICD-10-CM

## 2015-02-14 DIAGNOSIS — R9431 Abnormal electrocardiogram [ECG] [EKG]: Secondary | ICD-10-CM

## 2015-02-14 DIAGNOSIS — I472 Ventricular tachycardia: Secondary | ICD-10-CM

## 2015-02-14 LAB — BASIC METABOLIC PANEL
ANION GAP: 7 (ref 5–15)
BUN: 53 mg/dL — ABNORMAL HIGH (ref 6–20)
CALCIUM: 8.8 mg/dL — AB (ref 8.9–10.3)
CO2: 36 mmol/L — ABNORMAL HIGH (ref 22–32)
Chloride: 100 mmol/L — ABNORMAL LOW (ref 101–111)
Creatinine, Ser: 1.02 mg/dL — ABNORMAL HIGH (ref 0.44–1.00)
GFR calc Af Amer: >60 mL/min (ref >60)
GFR, EST NON AFRICAN AMERICAN: 54 mL/min — AB (ref >60)
Glucose, Bld: 131 mg/dL — ABNORMAL HIGH (ref 65–99)
Potassium: 4.1 mmol/L (ref 3.5–5.1)
Sodium: 143 mmol/L (ref 135–145)

## 2015-02-14 LAB — GLUCOSE, CAPILLARY
GLUCOSE-CAPILLARY: 105 mg/dL — AB (ref 65–99)
GLUCOSE-CAPILLARY: 118 mg/dL — AB (ref 65–99)
GLUCOSE-CAPILLARY: 118 mg/dL — AB (ref 65–99)
GLUCOSE-CAPILLARY: 163 mg/dL — AB (ref 65–99)
GLUCOSE-CAPILLARY: 98 mg/dL (ref 65–99)
Glucose-Capillary: 119 mg/dL — ABNORMAL HIGH (ref 65–99)
Glucose-Capillary: 137 mg/dL — ABNORMAL HIGH (ref 65–99)

## 2015-02-14 LAB — CBC
HCT: 30 % — ABNORMAL LOW (ref 36.0–46.0)
Hemoglobin: 9.4 g/dL — ABNORMAL LOW (ref 12.0–15.0)
MCH: 30.1 pg (ref 26.0–34.0)
MCHC: 31.3 g/dL (ref 30.0–36.0)
MCV: 96.2 fL (ref 78.0–100.0)
Platelets: 121 10*3/uL — ABNORMAL LOW (ref 150–400)
RBC: 3.12 MIL/uL — ABNORMAL LOW (ref 3.87–5.11)
RDW: 15.8 % — AB (ref 11.5–15.5)
WBC: 13.4 10*3/uL — ABNORMAL HIGH (ref 4.0–10.5)

## 2015-02-14 LAB — CLOSTRIDIUM DIFFICILE BY PCR: Toxigenic C. Difficile by PCR: NEGATIVE

## 2015-02-14 LAB — PROTIME-INR
INR: 1.46 (ref 0.00–1.49)
Prothrombin Time: 17.8 seconds — ABNORMAL HIGH (ref 11.6–15.2)

## 2015-02-14 LAB — PHOSPHORUS: PHOSPHORUS: 3.6 mg/dL (ref 2.5–4.6)

## 2015-02-14 LAB — MAGNESIUM: MAGNESIUM: 2.5 mg/dL — AB (ref 1.7–2.4)

## 2015-02-14 MED ORDER — OXYMETAZOLINE HCL 0.05 % NA SOLN
1.0000 | Freq: Once | NASAL | Status: AC | PRN
  Filled 2015-02-14: qty 15

## 2015-02-14 MED ORDER — SILVER NITRATE-POT NITRATE 75-25 % EX MISC
10.0000 | Freq: Once | CUTANEOUS | Status: AC | PRN
  Filled 2015-02-14: qty 10

## 2015-02-14 MED ORDER — FUROSEMIDE 10 MG/ML IJ SOLN
40.0000 mg | Freq: Every day | INTRAMUSCULAR | Status: DC
  Administered 2015-02-14 – 2015-02-16 (×3): 40 mg via INTRAVENOUS
  Filled 2015-02-14 (×3): qty 4

## 2015-02-14 MED ORDER — AMIODARONE LOAD VIA INFUSION
150.0000 mg | Freq: Once | INTRAVENOUS | Status: AC
  Administered 2015-02-14: 150 mg via INTRAVENOUS
  Filled 2015-02-14: qty 83.34

## 2015-02-14 MED ORDER — WARFARIN SODIUM 3 MG PO TABS
3.0000 mg | ORAL_TABLET | Freq: Every day | ORAL | Status: DC
  Administered 2015-02-14: 3 mg
  Filled 2015-02-14 (×2): qty 1

## 2015-02-14 MED ORDER — METHYLPREDNISOLONE SODIUM SUCC 40 MG IJ SOLR
30.0000 mg | Freq: Every day | INTRAMUSCULAR | Status: DC
  Administered 2015-02-15 – 2015-02-16 (×2): 30 mg via INTRAVENOUS
  Filled 2015-02-14 (×2): qty 0.75

## 2015-02-14 MED ORDER — AMIODARONE HCL IN DEXTROSE 360-4.14 MG/200ML-% IV SOLN
30.0000 mg/h | INTRAVENOUS | Status: DC
  Administered 2015-02-14: 30 mg/h via INTRAVENOUS
  Filled 2015-02-14 (×3): qty 200

## 2015-02-14 MED ORDER — TRIPLE ANTIBIOTIC 3.5-400-5000 EX OINT
1.0000 "application " | TOPICAL_OINTMENT | Freq: Once | CUTANEOUS | Status: AC | PRN
  Filled 2015-02-14: qty 1

## 2015-02-14 MED ORDER — AMIODARONE HCL 200 MG PO TABS
200.0000 mg | ORAL_TABLET | Freq: Two times a day (BID) | ORAL | Status: DC
  Administered 2015-02-15 – 2015-02-17 (×6): 200 mg via ORAL
  Filled 2015-02-14 (×8): qty 1

## 2015-02-14 MED ORDER — LIDOCAINE-EPINEPHRINE (PF) 1 %-1:200000 IJ SOLN
0.0000 mL | Freq: Once | INTRAMUSCULAR | Status: AC | PRN
  Filled 2015-02-14: qty 30

## 2015-02-14 MED ORDER — LIDOCAINE HCL 2 % EX GEL
1.0000 "application " | Freq: Once | CUTANEOUS | Status: AC | PRN
  Filled 2015-02-14: qty 5

## 2015-02-14 MED ORDER — LIDOCAINE VISCOUS 2 % MT SOLN
15.0000 mL | Freq: Once | OROMUCOSAL | Status: DC
  Filled 2015-02-14: qty 15

## 2015-02-14 MED ORDER — LIDOCAINE HCL 4 % EX SOLN
0.0000 mL | Freq: Once | CUTANEOUS | Status: AC | PRN
  Filled 2015-02-14: qty 50

## 2015-02-14 MED ORDER — AMIODARONE HCL IN DEXTROSE 360-4.14 MG/200ML-% IV SOLN
60.0000 mg/h | INTRAVENOUS | Status: DC
  Administered 2015-02-14: 60 mg/h via INTRAVENOUS
  Filled 2015-02-14: qty 200

## 2015-02-14 NOTE — Progress Notes (Signed)
MD notified of patient 48 beat run of Vtach. No new orders received.  Patient asymptomatic and sitting in chair watching tv. Call bell within reach.  RN will continue to monitor.

## 2015-02-14 NOTE — Progress Notes (Addendum)
[address]       [place] [address]             [phone]      26 Days Post-Op Procedure(s) (LRB): AORTIC VALVE REPLACEMENT (AVR) (N/A) MAZE (N/A)  AORTIC ROOT ENLARGEMENT (N/A) TRANSESOPHAGEAL ECHOCARDIOGRAM (TEE) (N/A) Subjective: Feels a little better/stronger each day. Less loose stools  Objective: Vital signs in last 24 hours: Temp:  [97.6 F (36.4 C)-98 F (36.7 C)] 97.6 F (36.4 C) (06/13 0504) Pulse Rate:  [64-72] 64 (06/13 0504) Cardiac Rhythm:  [-] Atrial fibrillation (06/12 2041) Resp:  [17-18] 18 (06/13 0504) BP: (130-143)/(46-56) 134/48 mmHg (06/13 0504) SpO2:  [94 %-100 %] 100 % (06/13 0504) Weight:  [236 lb 12.4 oz (107.4 kg)] 236 lb 12.4 oz (107.4 kg) (06/13 0504)  Hemodynamic parameters for last 24 hours:    Intake/Output from previous day:   Intake/Output this shift:    General appearance: alert, cooperative and no distress Heart: irregularly irregular rhythm Lungs: dim right>left base Abdomen: soft, non-tender Extremities: + BLE edema Wound: incis healing well  Lab Results:  Recent Labs  02/13/15 0500 02/14/15 0339  WBC 13.2* 13.4*  HGB 9.1* 9.4*  HCT 29.1* 30.0*  PLT 128* 121*   BMET:  Recent Labs  02/13/15 0500 02/14/15 0339  NA 143 143  K 4.1 4.1  CL 100* 100*  CO2 36* 36*  GLUCOSE 142* 131*  BUN 50* 53*  CREATININE 1.03* 1.02*  CALCIUM 8.7* 8.8*    PT/INR:  Recent Labs  02/14/15 0339  LABPROT 17.8*  INR 1.46   ABG    Component Value Date/Time   PHART 7.507* 02/05/2015 0442   HCO3 39.8* 02/05/2015 0442   TCO2 41 02/05/2015 0442   ACIDBASEDEF TEST WILL BE CREDITED 01/20/2015 0930   O2SAT 97.0 02/05/2015 0442   CBG (last 3)   Recent Labs  02/13/15 2029 02/14/15 0017 02/14/15 0641  GLUCAP 154* 163* 105*    Meds Scheduled Meds: . acetylcysteine  3 mL Nebulization Q6H  . albuterol  2.5 mg Nebulization Q6H  . amLODipine  10 mg Per Tube Daily  . antiseptic oral rinse  7 mL  Mouth Rinse q12n4p  . budesonide (PULMICORT) nebulizer solution  0.25 mg Nebulization Q6H WA  . chlorhexidine  15 mL Mouth Rinse BID  . free water  200 mL Per Tube Q6H  . insulin aspart  0-24 Units Subcutaneous 6 times per day  . methylPREDNISolone (SOLU-MEDROL) injection  40 mg Intravenous Daily  . pantoprazole sodium  40 mg Per Tube Daily  . sodium chloride  10-40 mL Intracatheter Q12H  . warfarin  2.5 mg Per Tube q1800  . Warfarin - Physician Dosing Inpatient   Does not apply q1800   Continuous Infusions: . sodium chloride Stopped (01/30/15 1900)  . sodium chloride Stopped (02/10/15 0800)  . amiodarone 60 mg/hr (02/14/15 0312)   Followed by  . amiodarone    . feeding supplement (VITAL AF 1.2 CAL) 1,000 mL (02/12/15 2256)   PRN Meds:.acetaminophen (TYLENOL) oral liquid 160 mg/5 mL, albuterol, metoprolol, ondansetron (ZOFRAN) IV, sodium chloride, traMADol  Xrays Dg Chest 2 View  02/12/2015   CLINICAL DATA:  Pneumonia.  EXAM: CHEST  2 VIEW  COMPARISON:  02/10/2015  FINDINGS: Sequelae of prior aortic valve replacement and left atrial appendage clipping are again identified. Enteric tube courses into the left upper abdomen with tip not imaged. Cardiomediastinal silhouette is unchanged. Lateral image is limited as patient was  unable to raise her arms. Small bilateral pleural effusions and bibasilar atelectasis are similar to the prior study. There is new platelike atelectasis in the right midlung. No pneumothorax is identified.  IMPRESSION: Similar appearance of small bilateral pleural effusions and bibasilar atelectasis.   Electronically Signed   By: Sebastian Ache   On: 02/12/2015 10:25    Assessment/Plan: S/P Procedure(s) (LRB): AORTIC VALVE REPLACEMENT (AVR) (N/A) MAZE (N/A)  AORTIC ROOT ENLARGEMENT (N/A) TRANSESOPHAGEAL ECHOCARDIOGRAM (TEE) (N/A)  1 conts aggressive pulm management 2 labs very stable, may need to increase coumadin dose 3 sugars stable 4 hemodyn stable with rate  controlled afib 5 push rehab as able  LOS: 26 days    GOLD,WAYNE E 02/14/2015  I have seen and examined the patient and agree with the assessment and plan as outlined.  Weight trending back up, some bilateral LE edema on exam, and hypernatremia has resolved.  Will restart low dose lasix.   Primary limitation at this point remains swallowing dysfunction - will ask ENT to evaluate and perform laryngoscopy to help discern etiology and provide expectations for recovery.  Should we consider PEG?    Purcell Nails 02/14/2015 8:36 AM

## 2015-02-14 NOTE — Consult Note (Signed)
Reason for Consult: Difficulty swallowing Referring Physician: Clarence H Owen, MD  Sandra Turner is an 73 y.o. female.  HPI: Status post aortic valve replacement. Weak breathy voice and aspiration when trying to drink. Seen by speech pathology.  Past Medical History  Diagnosis Date  . Arthritis   . Chronic atrial fibrillation     takes Pradaxa daily  . Aortic stenosis     Severe by echo August 2015  . Pneumonia 09/2014  . History of bronchitis 2015  . Joint pain   . Back pain     reason unknown  . Family history of adverse reaction to anesthesia     uncle with MH in the 60's,cousin in the 70's with MH  . 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 None    . Left and right heart catheterization with coronary angiogram N/A 11/01/2014    Procedure: LEFT AND RIGHT HEART CATHETERIZATION WITH CORONARY ANGIOGRAM;  Surgeon: Christopher D McAlhany, MD;  Location: [facility]ATH [facility];  Service: Cardiovascular;  Laterality: N/A;  . Aortic valve replacement N/A 01/19/2015    Procedure: AORTIC VALVE REPLACEMENT (AVR);  Surgeon: Clarence H Owen, MD;  Location: [facility] OR;  Service: Open Heart Surgery;  Laterality: N/A;  . Maze N/A 01/19/2015    Procedure: MAZE;  Surgeon: Clarence H Owen, MD;  Location: [facility] OR;  Service: Open Heart Surgery;  Laterality: N/A;  . Aortic root enlargement N/A 01/19/2015    Procedure:  AORTIC ROOT ENLARGEMENT;  Surgeon: Clarence H Owen, MD;  Location: [facility] OR;  Service: Open Heart Surgery;  Laterality: N/A;  . Tee without cardioversion N/A 01/19/2015    Procedure: TRANSESOPHAGEAL ECHOCARDIOGRAM (TEE);   Surgeon: Clarence H Owen, MD;  Location: [facility] OR;  Service: Open Heart Surgery;  Laterality: N/A;    Family History  Problem Relation Age of Onset  . Breast cancer Mother   . Congestive Heart Failure Mother   . Cancer Father     ? Lymphoma  . Malignant hyperthermia Cousin     Social History:  reports that she has never smoked. She does not have any smokeless tobacco history on file. She reports that she does not drink alcohol or use illicit drugs.  Allergies:  Allergies  Allergen Reactions  . Bee Venom Swelling  . Latex Rash  . Penicillins Rash    Medications: Reviewed  Results for orders placed or performed during the hospital encounter of 01/19/15 (from the past 48 hour(s))  Glucose, capillary     Status: Abnormal   Collection Time: 02/12/15  8:37 PM  Result Value Ref Range   Glucose-Capillary 173 (H) 65 - 99 mg/dL   Comment 1 Notify RN    Comment 2 Document in Chart   Glucose, capillary     Status: Abnormal   Collection Time: 02/13/15 12:15 AM  Result Value Ref Range   Glucose-Capillary 137 (H) 65 - 99 mg/dL   Comment 1 Notify RN    Comment 2 Document in Chart   Protime-INR     Status: Abnormal   Collection Time: 02/13/15  5:00 AM  Result Value Ref Range     Prothrombin Time 17.4 (H) 11.6 - 15.2 seconds   INR 1.42 0.00 - 1.49  Magnesium     Status: Abnormal   Collection Time: 02/13/15  5:00 AM  Result Value Ref Range   Magnesium 2.5 (H) 1.7 - 2.4 mg/dL  Phosphorus     Status: None   Collection Time: 02/13/15  5:00 AM  Result Value Ref Range   Phosphorus 3.8 2.5 - 4.6 mg/dL  Basic metabolic panel     Status: Abnormal   Collection Time: 02/13/15  5:00 AM  Result Value Ref Range   Sodium 143 135 - 145 mmol/L   Potassium 4.1 3.5 - 5.1 mmol/L   Chloride 100 (L) 101 - 111 mmol/L   CO2 36 (H) 22 - 32 mmol/L   Glucose, Bld 142 (H) 65 - 99 mg/dL   BUN 50 (H) 6 - 20 mg/dL   Creatinine, Ser 1.03 (H) 0.44 - 1.00 mg/dL   Calcium 8.7 (L) 8.9 - 10.3 mg/dL   GFR calc non  Af Amer 53 (L) >60 mL/min   GFR calc Af Amer >60 >60 mL/min    Comment: (NOTE) The eGFR has been calculated using the CKD EPI equation. This calculation has not been validated in all clinical situations. eGFR's persistently <60 mL/min signify possible Chronic Kidney Disease.    Anion gap 7 5 - 15  CBC     Status: Abnormal   Collection Time: 02/13/15  5:00 AM  Result Value Ref Range   WBC 13.2 (H) 4.0 - 10.5 K/uL   RBC 3.01 (L) 3.87 - 5.11 MIL/uL   Hemoglobin 9.1 (L) 12.0 - 15.0 g/dL   HCT 29.1 (L) 36.0 - 46.0 %   MCV 96.7 78.0 - 100.0 fL   MCH 30.2 26.0 - 34.0 pg   MCHC 31.3 30.0 - 36.0 g/dL   RDW 15.7 (H) 11.5 - 15.5 %   Platelets 128 (L) 150 - 400 K/uL  Glucose, capillary     Status: Abnormal   Collection Time: 02/13/15  5:22 AM  Result Value Ref Range   Glucose-Capillary 127 (H) 65 - 99 mg/dL   Comment 1 Notify RN    Comment 2 Document in Chart   Glucose, capillary     Status: Abnormal   Collection Time: 02/13/15  7:35 AM  Result Value Ref Range   Glucose-Capillary 111 (H) 65 - 99 mg/dL  Glucose, capillary     Status: Abnormal   Collection Time: 02/13/15 11:53 AM  Result Value Ref Range   Glucose-Capillary 108 (H) 65 - 99 mg/dL  Glucose, capillary     Status: Abnormal   Collection Time: 02/13/15  4:54 PM  Result Value Ref Range   Glucose-Capillary 144 (H) 65 - 99 mg/dL  Glucose, capillary     Status: Abnormal   Collection Time: 02/13/15  8:29 PM  Result Value Ref Range   Glucose-Capillary 154 (H) 65 - 99 mg/dL  Glucose, capillary     Status: Abnormal   Collection Time: 02/14/15 12:17 AM  Result Value Ref Range   Glucose-Capillary 163 (H) 65 - 99 mg/dL   Comment 1 Notify RN    Comment 2 Document in Chart   Protime-INR     Status: Abnormal   Collection Time: 02/14/15  3:39 AM  Result Value Ref Range   Prothrombin Time 17.8 (H) 11.6 - 15.2 seconds   INR 1.46 0.00 - 1.49  Magnesium     Status: Abnormal   Collection Time: 02/14/15  3:39 AM    Result Value Ref Range    Magnesium 2.5 (H) 1.7 - 2.4 mg/dL  Phosphorus     Status: None   Collection Time: 02/14/15  3:39 AM  Result Value Ref Range   Phosphorus 3.6 2.5 - 4.6 mg/dL  Basic metabolic panel     Status: Abnormal   Collection Time: 02/14/15  3:39 AM  Result Value Ref Range   Sodium 143 135 - 145 mmol/L   Potassium 4.1 3.5 - 5.1 mmol/L   Chloride 100 (L) 101 - 111 mmol/L   CO2 36 (H) 22 - 32 mmol/L   Glucose, Bld 131 (H) 65 - 99 mg/dL   BUN 53 (H) 6 - 20 mg/dL   Creatinine, Ser 1.02 (H) 0.44 - 1.00 mg/dL   Calcium 8.8 (L) 8.9 - 10.3 mg/dL   GFR calc non Af Amer 54 (L) >60 mL/min   GFR calc Af Amer >60 >60 mL/min    Comment: (NOTE) The eGFR has been calculated using the CKD EPI equation. This calculation has not been validated in all clinical situations. eGFR's persistently <60 mL/min signify possible Chronic Kidney Disease.    Anion gap 7 5 - 15  CBC     Status: Abnormal   Collection Time: 02/14/15  3:39 AM  Result Value Ref Range   WBC 13.4 (H) 4.0 - 10.5 K/uL   RBC 3.12 (L) 3.87 - 5.11 MIL/uL   Hemoglobin 9.4 (L) 12.0 - 15.0 g/dL   HCT 30.0 (L) 36.0 - 46.0 %   MCV 96.2 78.0 - 100.0 fL   MCH 30.1 26.0 - 34.0 pg   MCHC 31.3 30.0 - 36.0 g/dL   RDW 15.8 (H) 11.5 - 15.5 %   Platelets 121 (L) 150 - 400 K/uL  Glucose, capillary     Status: Abnormal   Collection Time: 02/14/15  6:41 AM  Result Value Ref Range   Glucose-Capillary 105 (H) 65 - 99 mg/dL   Comment 1 Notify RN    Comment 2 Document in Chart   Glucose, capillary     Status: Abnormal   Collection Time: 02/14/15  8:18 AM  Result Value Ref Range   Glucose-Capillary 119 (H) 65 - 99 mg/dL   Comment 1 Notify RN   Glucose, capillary     Status: Abnormal   Collection Time: 02/14/15 11:29 AM  Result Value Ref Range   Glucose-Capillary 118 (H) 65 - 99 mg/dL   Comment 1 Notify RN   Clostridium Difficile by PCR (not at [facility])     Status: None   Collection Time: 02/14/15 12:15 PM  Result Value Ref Range   C difficile by pcr  NEGATIVE NEGATIVE  Glucose, capillary     Status: Abnormal   Collection Time: 02/14/15  4:07 PM  Result Value Ref Range   Glucose-Capillary 118 (H) 65 - 99 mg/dL   Comment 1 Notify RN    Comment 2 Document in Chart     No results found.  ROS:Negative except as listed in admit H&P  Blood pressure 135/51, pulse 65, temperature 97.5 F (36.4 C), temperature source Oral, resp. rate 18, height 5' 2" (1.575 m), weight 107.4 kg (236 lb 12.4 oz), SpO2 100 %.  PHYSICAL EXAM: Overall appearance:  Healthy appearing, in no distress, very weak and breathy voice Head:  Normocephalic, atraumatic. Ears: External ears are healthy. Nose: External nose is healthy in appearance. Internal nasal exam free of any lesions or obstruction. Oral Cavity:  There are no mucosal lesions or masses identified.   Oral Pharynx/Hypopharynx/Larynx: no signs of any mucosal lesions or masses identified.  Larynx/Hypopharynx: No mucosal lesions identified. 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 Neuro:  No identifiable neurologic deficits. Neck: No palpable neck masses.  Studies Reviewed: none  Procedures: Flexible fiberoptic laryngoscopy was performed following topical Xylocaine viscus application in the left nasal cavity. Findings are as noted above.   Assessment/Plan: Left vocal cord paralysis. This may be temporary or permanent. It can take up to 1-1/2 years to recover if it is temporary. There are 2 surgical options for this. One is an endoscopic injection of hydroxyapatite into the cord to bulking up. This is very simple and would not require being off of anticoagulation but is not as precise and is a temporary measure. The second is an open procedure which required being off anticoagulation. It is more permanent and more precise. I'll speak with cardiology about what our options are.  ROSEN, JEFRY 02/14/2015, 7:01 PM       

## 2015-02-14 NOTE — Progress Notes (Signed)
Utilization review completed.  

## 2015-02-14 NOTE — Progress Notes (Signed)
Cardiology overnight coverage  Called to see patient for 15 seconds of monomorphic VT at 190 bpm, asymptomatic during event.  Recent echo shows normal EF, history of Afib, no known history of VT   On exam BP 143/56 mmHg  Pulse 71  Temp(Src) 98 F (36.7 C) (Oral)  Resp 18  Ht 5\' 2"  (1.575 m)  Wt 106 kg (233 lb 11 oz)  BMI 42.73 kg/m2  SpO2 96% 73yoF in NAD, comfortable RRR   Review of telemetry confirms VT, with additional episodes of NSVT noted.    Patient currently on po amiodarone, will change to IV amiodarone for an additional 5 gram load.  Check electrolytes.  Will run ECG rhythm strip to attempt to capture PVC on 12 lead.  Recommend EP consult in AM  Dossie Arbour, MD Cardiology Fellow  Addendum: Rhythm Strip shows PVC with LBBB mortphology, V5 transition, Positive in I, II, negative in AVF and III.  ?RV free wall PVC. Will leave rhythm strip in chart.

## 2015-02-14 NOTE — Progress Notes (Signed)
[facility]  S: Feeling better, still having trouble swallowing but breathing OK. Tells me she has had more breathing trouble in the last 5 years, hospitalized for pneumonia 09/2014.  Rarely uses inhalers at home. OCeasar Mons Vitals:   02/14/15 0248 02/14/15 0504 02/14/15 0917 02/14/15 0934  BP:  134/48    Pulse:  64 66   Temp:  97.6 F (36.4 C)    TempSrc:  Oral    Resp:  18 18   Height:      Weight:  107.4 kg (236 lb 12.4 oz)    SpO2: 95% 100% 100% 100%   3L [place]  Gen: sitting up in chair, no distress HENT: NCAT, EOMi PULM: CTA B, normal effort CV: RRR, trace edema GI: BS+, soft, nontender Derm: no cyanosis or rash. Edema noted Psyche: normal mood and affect  CXR reviewed> bibasilar atlectasis, no infiltrate or significant effusion  Labs reviewed  6/5 BAL fungal culture pending  Daily rounding note from TCTS reviewed  Impression/Plan: 1) S/P AVR, MAZE: seems slightly volume up on exam, agree with lasix per TCTS 2) Afib, s/p MAZE: in sinus rhythm on Amiodarone gtt 3) Acute asthma flare with likely underlying fungal airways disease or possibly hypersensitivity pneumonitis> difficult to know what is exactly going on here, but given moldy environment and significant mucus production and improvement with solumedrol, this sounds like an allergic fungal process. Cultures pending. Will plan slow steroid taper over 2-3 months as is typical for these types of problems. -decrease solumedrol to 30mg , will change to prednisone when swallowing -Continue budesonide nebulized would discharge home on this medication 0.5mg  bid -CXR prn - wean O2 off as able, goal O2 > 90%  Will follow  Heber [place], MD [place] [facility] Pager: [phone] Cell: [phone] After 3pm or if no response, call [phone]

## 2015-02-14 NOTE — Progress Notes (Signed)
Speech Language Pathology Treatment: Dysphagia  Patient Details Name: Sandra Turner MRN: [id number] DOB: [birth year] Today's Date: 02/14/2015 Time: 5631-4970 SLP Time Calculation (min) (ACUTE ONLY): 17 min  Assessment / Plan / Recommendation Clinical Impression  Treatment focused on laryngeal adduction exercises and assessment of pt ability to achieve adequate airway closure for explosive cough and airway protection during swallow. Pt reports she has worked on exercises over the weekend, though function does not appear markedly improved since Friday. With max cues, pt can only achieve brief phonation, but cannot sustain phonation or block airflow for cough or holding breath despite max cues. Trials of ice chips do not elicit signs of aspiration, though this is not indicative of improvement since under direct observation pt has no apparent sensation of POs on the glottis or in the trachea.   Dr. Cornelius Moras present during session, he will ask ENT to consult for assessment and prognosis of laryngeal function. SLP will f/u with MBS for lateral view of swallowing to determine quantity of subglottic aspiration with PO intake (which is more difficult to visualize on a FEES) to help decide level of risk with modified diet textures.    HPI Other Pertinent Information: Pt is a 74 year old female who underwent AVR on 5/18. 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    Pertinent Vitals    SLP Plan  MBS    Recommendations Diet recommendations: NPO              Plan: MBS     GO    Harlon Ditty, MA CCC-SLP [phone]  Claudine Mouton 02/14/2015, 9:16 AM

## 2015-02-14 NOTE — Progress Notes (Signed)
CARDIAC REHAB PHASE I   PRE:  Rate/Rhythm: 68 afib    BP: sitting 136/48    SaO2: 98 3L  MODE:  Ambulation: 80 ft   POST:  Rate/Rhythm: 84 afib    BP: sitting 155/64     SaO2: 94 3L  Pt willing to walk immediately today. Able to stand almost independently. Used rollator for support and mental security for pt. Verbal cues to pace herself to conserve energy. Steady gait. Sat x1 for rest after 40 ft. Rest x2-3 min then pt able to return to recliner in room without resting again. No major c/o, slight SOB. VSS. Will continue to follow.  [id number]   Elissa Lovett [place] CES, ACSM 02/14/2015 10:01 AM

## 2015-02-14 NOTE — Consult Note (Signed)
Patient Name: Sandra Turner      SUBJECTIVE:  Admitted for AS-AVR and Maze with perisistent Afib and normal LV function  Post op course complicated by prolonged respiratory distress requiring reintubation , swallowing difficulties  She has been on amiodarone  Earlier today cardiology called for "monomorphic VT" lasting 15 sec  amio cahnged from PO>>IV  Normal lV function Past Medical History  Diagnosis Date  . Arthritis   . Chronic atrial fibrillation     takes Pradaxa daily  . Aortic stenosis     Severe by echo August 2015  . Pneumonia 09/2014  . History of bronchitis 2015  . Joint pain   . Back pain     reason unknown  . Family history of adverse reaction to anesthesia     uncle with MH in the 60's,cousin in the 53's with MH  . 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 acetylcysteine  3 mL Nebulization Q6H  . albuterol  2.5 mg Nebulization Q6H  . amLODipine  10 mg Per Tube Daily  . antiseptic oral rinse  7 mL Mouth Rinse q12n4p  . budesonide (PULMICORT) nebulizer solution  0.25 mg Nebulization Q6H WA  . chlorhexidine  15 mL Mouth Rinse BID  . free water  200 mL Per Tube Q6H  . furosemide  40 mg Intravenous Daily  . insulin aspart  0-24 Units Subcutaneous 6 times per day  . [START ON 02/15/2015] methylPREDNISolone (SOLU-MEDROL) injection  30 mg Intravenous Daily  . pantoprazole sodium  40 mg Per Tube Daily  . sodium chloride  10-40 mL Intracatheter Q12H  . warfarin  3 mg Per Tube q1800  . Warfarin - Physician Dosing Inpatient   Does not apply q1800   Continuous Infusions: . sodium  chloride Stopped (01/30/15 1900)  . sodium chloride Stopped (02/10/15 0800)  . amiodarone 30 mg/hr (02/14/15 0830)  . feeding supplement (VITAL AF 1.2 CAL) 1,000 mL (02/12/15 2256)   acetaminophen (TYLENOL) oral liquid 160 mg/5 mL, albuterol, metoprolol, ondansetron (ZOFRAN) IV, sodium chloride, traMADol    PHYSICAL EXAM Filed Vitals:   02/14/15 0248 02/14/15 0504 02/14/15 0917 02/14/15 0934  BP:  134/48    Pulse:  64 66   Temp:  97.6 F (36.4 C)    TempSrc:  Oral    Resp:  18 18   Height:      Weight:  236 lb 12.4 oz (107.4 kg)    SpO2: 95% 100% 100% 100%   Well developed and nourished in mod acute distress HENT normal Neck supple with JVP-undiscerninible Clear but significantly decreased Irregularly irregular rate and rhythm with controlled ventricular response, no significnat murmurs or gallops Abd-soft with active BS without hepatomegaly No Clubbing cyanosis 1+edema Skin-warm and dry A & Oriented  Grossly normal sensory and motor function  TELEMETRY: Reviewed telemetry pt in afib' VT is ARTIFACT    Intake/Output Summary (Last 24 hours) at 02/14/15 1258 Last data filed at 02/14/15 1041  Gross per 24 hour  Intake    270 ml  Output      0 ml  Net    270  ml    LABS: Basic Metabolic Panel:  Recent Labs Lab 02/08/15 0449 02/09/15 0520 02/10/15 0410 02/11/15 0600  02/12/15 0435 02/13/15 0500 02/14/15 0339  NA 148* 146* 147* 147*  --  147* 143 143  K 4.4 4.1 3.8 3.6  --  3.8 4.1 4.1  CL 100* 100* 102 100*  --  101 100* 100*  CO2 39* 35* 36* 36*  --  35* 36* 36*  GLUCOSE 109* 121* 137* 122*  --  109* 142* 131*  BUN 49* 49* 51* 52*  --  51* 50* 53*  CREATININE 1.19* 1.17* 1.15* 1.08*  --  1.05* 1.03* 1.02*  CALCIUM 9.1 8.8* 8.7* 8.7*  --  8.9 8.7* 8.8*  MG  --   --   --   --   < > 2.5* 2.5* 2.5*  PHOS  --   --   --   --   < > 3.2 3.8 3.6  < > = values in this interval not displayed. Cardiac Enzymes: No results for input(s): CKTOTAL, CKMB, CKMBINDEX,  TROPONINI in the last 72 hours. CBC:  Recent Labs Lab 02/09/15 0520 02/10/15 0410 02/12/15 0435 02/13/15 0500 02/14/15 0339  WBC 15.8* 14.7* 14.5* 13.2* 13.4*  HGB 9.8* 9.7* 9.4* 9.1* 9.4*  HCT 32.4* 31.8* 31.3* 29.1* 30.0*  MCV 97.0 97.0 96.6 96.7 96.2  PLT 190 157 136* 128* 121*   PROTIME:  Recent Labs  02/12/15 0435 02/13/15 0500 02/14/15 0339  LABPROT 18.1* 17.4* 17.8*  INR 1.49 1.42 1.46   Liver Function Tests: No results for input(s): AST, ALT, ALKPHOS, BILITOT, PROT, ALBUMIN in the last 72 hours. No results for input(s): LIPASE, AMYLASE in the last 72 hours. BNP: BNP (last 3 results)  Recent Labs  01/26/15 0235 02/07/15 0520 02/12/15 0435  BNP 216.8* 204.8* 271.9*    ProBNP (last 3 results) No results for input(s): PROBNP in the last 8760 hours.  D-Dimer: No results for input(s): DDIMER in the last 72 hours. Hemoglobin A1C: No results for input(s): HGBA1C in the last 72 hours. Fasting Lipid Panel: No results for input(s): CHOL, HDL, LDLCALC, TRIG, CHOLHDL, LDLDIRECT in the last 72 hours. Thyroid Function Tests: No results for input(s): TSH, T4TOTAL, T3FREE, THYROIDAB in the last 72 hours.  Invalid input(s): FREET3 Anemia Panel: No results for input(s): VITAMINB12, FOLATE, FERRITIN, TIBC, IRON, RETICCTPCT in the last 72 hours.    ASSESSMENT AND PLAN:  Principal Problem: vENTRICULAR TACHYCARDIA Active Problems:   Atrial fibrillation   The monitor was erroneously interpreted as VT  It is atrial fib with motion artifact  Will return amio to po per TCTS   Signed   Sherryl Manges MD  02/14/2015

## 2015-02-14 NOTE — Progress Notes (Signed)
PT Cancellation Note  Patient Details Name: Sandra Turner MRN: [id number] DOB: August 31, 1941   Cancelled Treatment:    Reason Eval/Treat Not Completed: Other (comment) (pt reports she has been up to bsc frequently and is tired. Also walked with cardiac rehab  earlier.)   Caitlynn Ju 02/14/2015, 2:16 PM [facility] PT [phone]

## 2015-02-15 ENCOUNTER — Inpatient Hospital Stay (HOSPITAL_COMMUNITY): Payer: Medicare Other

## 2015-02-15 DIAGNOSIS — J398 Other specified diseases of upper respiratory tract: Secondary | ICD-10-CM

## 2015-02-15 DIAGNOSIS — J3800 Paralysis of vocal cords and larynx, unspecified: Secondary | ICD-10-CM

## 2015-02-15 LAB — GLUCOSE, CAPILLARY
GLUCOSE-CAPILLARY: 104 mg/dL — AB (ref 65–99)
GLUCOSE-CAPILLARY: 104 mg/dL — AB (ref 65–99)
Glucose-Capillary: 126 mg/dL — ABNORMAL HIGH (ref 65–99)
Glucose-Capillary: 155 mg/dL — ABNORMAL HIGH (ref 65–99)
Glucose-Capillary: 160 mg/dL — ABNORMAL HIGH (ref 65–99)

## 2015-02-15 LAB — PHOSPHORUS: Phosphorus: 3.6 mg/dL (ref 2.5–4.6)

## 2015-02-15 LAB — MAGNESIUM: MAGNESIUM: 2.4 mg/dL (ref 1.7–2.4)

## 2015-02-15 LAB — PROTIME-INR
INR: 1.43 (ref 0.00–1.49)
Prothrombin Time: 17.5 seconds — ABNORMAL HIGH (ref 11.6–15.2)

## 2015-02-15 NOTE — Progress Notes (Signed)
Physical Therapy Treatment Patient Details Name: Sandra Turner MRN: [id number] DOB: 10-04-1940 Today's Date: 02/15/2015    History of Present Illness Sandra Turner is a 74 year old female adm with dyspnea, severe aortic stenosis, and atrial fibrillation. 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 Intubated 01/31/15 with likely due to HCAP, severe atelectasis w/ mucous plugging, required multiple bronchoscopy with lavage due to mucous plugging, bil chest tubes placed, extubated 6/6    PT Comments    Progressing towards PT goals. Tolerated seated exercises on room air, sats 94%. Ambulates up to 30 feet today with min assist, moderate dyspnea, SpO2 86% on room air, improves with pursed lip breathing and seated rest break back in to 90s. HR maintained 70s-80 bpm. Patient will continue to benefit from skilled physical therapy services to further improve independence with functional mobility.   Follow Up Recommendations  SNF;Supervision/Assistance - 24 hour (however if Medicaid only, will not qualify for PT at SNF)     Equipment Recommendations  Rolling walker with 5" wheels;3in1 (PT)    Recommendations for Other Services       Precautions / Restrictions Precautions Precautions: Fall;Sternal Precaution Comments: Education on sternal precautions Restrictions Weight Bearing Restrictions: Yes Other Position/Activity Restrictions: Sternal precautions    Mobility  Bed Mobility               General bed mobility comments: Patient in chair as PT entered room  Transfers Overall transfer level: Needs assistance Equipment used: Rolling walker (2 wheeled) Transfers: Sit to/from Stand Sit to Stand: Min assist Stand pivot transfers: Min assist       General transfer comment: Min assist for boost to stand and balance for pivot to [facility]. VC for hand placement and to avoid push/pull for sternal precautions  Ambulation/Gait Ambulation/Gait assistance: Min  assist Ambulation Distance (Feet): 30 Feet Assistive device: Rolling walker (2 wheeled) Gait Pattern/deviations: Step-through pattern;Decreased stride length;Trunk flexed;Shuffle;Drifts right/left Gait velocity: Decreased   General Gait Details: min assist for walker control. VC to maintain sternal precautions with light use of hands on RW for balance. VC for upright posture. SpO2 down to 86% on room air, improved to 94% after sitting with cues for pursed lip breathing within 1 minute   Stairs            Wheelchair Mobility    Modified Rankin (Stroke Patients Only)       Balance Overall balance assessment: Needs assistance Sitting-balance support: No upper extremity supported;Feet supported Sitting balance-Leahy Scale: Fair     Standing balance support: No upper extremity supported Standing balance-Leahy Scale: Fair                      Cognition Arousal/Alertness: Awake/alert Behavior During Therapy: WFL for tasks assessed/performed Overall Cognitive Status: Within Functional Limits for tasks assessed                      Exercises General Exercises - Lower Extremity Ankle Circles/Pumps: AROM;Both;10 reps;Seated Long Arc Quad: Strengthening;Both;10 reps;Seated Hip Flexion/Marching: Strengthening;Both;10 reps;Seated    General Comments General comments (skin integrity, edema, etc.): HR highest at 80, avg in 70s      Pertinent Vitals/Pain Pain Assessment: Faces Faces Pain Scale: Hurts little more Pain Location: sternum Pain Descriptors / Indicators: Grimacing;Guarding Pain Intervention(s): Monitored during session;Repositioned;Limited activity within patient's tolerance    Home Living                      Prior  Function            PT Goals (current goals can now be found in the care plan section) Acute Rehab PT Goals PT Goal Formulation: With patient Time For Goal Achievement: 02/16/15 Potential to Achieve Goals: Fair Progress  towards PT goals: Progressing toward goals    Frequency  Min 3X/week    PT Plan Current plan remains appropriate    Co-evaluation             End of Session Equipment Utilized During Treatment: Oxygen Activity Tolerance: Patient limited by fatigue Patient left: in chair;with call bell/phone within reach     Time: 8786-7672 PT Time Calculation (min) (ACUTE ONLY): 23 min  Charges:  $Gait Training: 8-22 mins $Therapeutic Exercise: 8-22 mins                    G Codes:      Berton Mount February 26, 2015, 1:19 PM Charlsie Merles, [place] [phone]

## 2015-02-15 NOTE — Progress Notes (Signed)
Attempted x2 this afternoon to walk with pt. Both times she refuses, stating she can't breath well due to cleaning supplies bothering her. Could not encourage pt to walk. Will f/u tomorrow. Ethelda Chick CES, ACSM 2:51 PM 02/15/2015

## 2015-02-15 NOTE — Progress Notes (Signed)
Nutrition Follow-up  DOCUMENTATION CODES:  Morbid obesity  INTERVENTION:   Continue Vital AF 1.2 formula at goal rate of 70 ml/hr   TF regimen to provide 2016 kcals, 126 gm protein, 1362 ml of free water  NUTRITION DIAGNOSIS:  Inadequate oral intake related to dysphagia as evidenced by NPO status, ongoing  GOAL:  Patient will meet greater than or equal to 90% of their needs, met  MONITOR:  TF tolerance, Diet advancement, PO intake, Labs, Weight trends, I & O's  ASSESSMENT: 74 yo Female developed progressive dyspnea/hypoxia from AECOPD and pulmonary edema/pleural effusions after AVR with MAZE on 5/18.  Patient s/p procedure 5/31: AORTIC VALVE REPLACEMENT (AVR)  MAZE PROCEDURE AORTIC ROOT ENLARGEMENT  Pt extubated 6/6.  Vital AF 1.2 formula infusing via small bore feeding tube (tip in proximal stomach) at goal rate of 70 ml/hr; providing 2016 kcals, 126 gm protein,1362 ml of free water. Free water flushes at 200 ml every 6 hours.  S/p FEES 6/9.  SLP recommending continued NPO status.    Notes reviewed.  + left vocal cord paralysis.  ENT recommending medialization.  TCTS considering PEG.  Height:  Ht Readings from Last 1 Encounters:  01/20/15 5' 2"  (1.575 m)    Weight: -----> stable  Wt Readings from Last 1 Encounters:  02/15/15 236 lb 12.4 oz (107.4 kg)    06/13  236 lb 06/12  233 lb 06/11  232 lb 06/10  231 lb 06/09  230 lb 06/08  230 lb 06/07  232 lb 06/06  235 lb 06/05  242 lb 06/04  245 lb  Ideal Body Weight:  50 kg  Wt Readings from Last 10 Encounters:  02/15/15 236 lb 12.4 oz (107.4 kg)  01/17/15 247 lb (112.038 kg)  01/17/15 237 lb 11.2 oz (107.82 kg)  12/27/14 231 lb (104.781 kg)  11/22/14 240 lb (108.863 kg)  11/01/14 240 lb (108.863 kg)  10/04/14 241 lb 9.6 oz (109.589 kg)  09/11/14 240 lb 11.2 oz (109.181 kg)  08/16/14 249 lb (112.946 kg)  05/12/14 249 lb (112.946 kg)    BMI:  Body mass index is 43.3 kg/(m^2).  Estimated  Nutritional Needs:  Kcal:  2000-2200  Protein:  110-120 gm  Fluid:  per MD  Skin:  Wound (see comment) (Stage I to bilateral ears)  Diet Order:  Diet NPO time specified  EDUCATION NEEDS:  No education needs identified at this time   Intake/Output Summary (Last 24 hours) at 02/15/15 1407 Last data filed at 02/15/15 1140  Gross per 24 hour  Intake 2456.84 ml  Output   1500 ml  Net 956.84 ml    Last BM:  6/14  Arthur Holms, RD, LDN Pager #: [phone] After-Hours Pager #: [phone]

## 2015-02-15 NOTE — Progress Notes (Signed)
[facility] PCCM  S: Feels OK, doing PT  O:  Filed Vitals:   02/14/15 2125 02/14/15 2127 02/15/15 0323 02/15/15 0332  BP:    120/49  Pulse: 61  67 62  Temp:      TempSrc:    Oral  Resp: 18  19 18   Height:      Weight:    236 lb 12.4 oz (107.4 kg)  SpO2: 100% 100% 98% 96%   3L [place]  Gen: sitting up in chair, no distress HENT: NCAT, EOMi PULM: CTA B, normal effort CV: RRR, trace edema GI: BS+, soft, nontender Derm: no cyanosis or rash. Edema noted Psyche: normal mood and affect  CXR reviewed 6/14 >allowing for technique NSC, rt effusion/atx  Labs no new labs 6/14  6/5 BAL fungal culture pending    Intake/Output Summary (Last 24 hours) at 02/15/15 0819 Last data filed at 02/15/15 0700  Gross per 24 hour  Intake 2603.7 ml  Output   1550 ml  Net 1053.7 ml   Impression/Plan: 1) S/P AVR, MAZE: seems slightly volume up on exam, agree with lasix per TCTS, note remains positive i/o 2) Afib, s/p MAZE: in sinus rhythm off Amiodarone gtt, now on po 6/14 3) Acute asthma flare with likely underlying fungal airways disease or possibly hypersensitivity pneumonitis> tolerating decreased dose of solumedrol -slow prednisone taper -Continue budesonide nebulized would discharge home on this medication 0.5mg  bid -CXR prn - wean O2 off as able, goal O2 > 90%  4) Left vocal cord paralysis, evaluated by ENT> ENT rec medialization, TCTS considering PEG.    Will follow Slow progress  Sandra Turner ACNP Sandra Turner PCCM Pager [phone] till 3 pm If no answer page [phone] 02/15/2015, 8:21 AM  Attending:  I have seen and examined the patient with nurse practitioner/resident and agree with the note above.   Breathing remains stable with decreased dose of prednisone, lungs clear Vocal cord paralysis noted on laryngoscopy yesterday  Asthma flare > would plan on slow prednisone taper, will follow this week and likely decrease prednisone again Thursday or Friday, continue flutter valve,  mobilization Vocal cord paralysis> agree with medialization if stable enough to go to OR, but if conservative measures taken (feeding tube), would favor GJ tube for post pyloric feedings to minimize risk of aspiration   Sandra [place], MD [place] [facility] Pager: [phone] Cell: [phone] After 3pm or if no response, call [phone]

## 2015-02-15 NOTE — Progress Notes (Addendum)
[address]       [place] [id number]             [phone]      27 Days Post-Op Procedure(s) (LRB): AORTIC VALVE REPLACEMENT (AVR) (N/A) MAZE (N/A)  AORTIC ROOT ENLARGEMENT (N/A) TRANSESOPHAGEAL ECHOCARDIOGRAM (TEE) (N/A) Subjective: Feels ok, some SOB   Objective: Vital signs in last 24 hours: Temp:  [97.5 F (36.4 C)-98.3 F (36.8 C)] 98.3 F (36.8 C) (06/13 2037) Pulse Rate:  [61-67] 62 (06/14 0332) Cardiac Rhythm:  [-] Atrial fibrillation (06/13 2200) Resp:  [18-19] 18 (06/14 0332) BP: (120-139)/(45-51) 120/49 mmHg (06/14 0332) SpO2:  [96 %-100 %] 96 % (06/14 0332) Weight:  [236 lb 12.4 oz (107.4 kg)] 236 lb 12.4 oz (107.4 kg) (06/14 0332)  Hemodynamic parameters for last 24 hours:    Intake/Output from previous day: 06/13 0701 - 06/14 0700 In: 2673.7 [I.V.:203.7; NG/GT:2470] Out: 1550 [Urine:1550] Intake/Output this shift:    General appearance: alert, cooperative and no distress Heart: irregularly irregular rhythm Lungs: dim in bases Abdomen: benign Extremities: + BLE  edema Wound: incis healing well  Lab Results:  Recent Labs  02/13/15 0500 02/14/15 0339  WBC 13.2* 13.4*  HGB 9.1* 9.4*  HCT 29.1* 30.0*  PLT 128* 121*   BMET:  Recent Labs  02/13/15 0500 02/14/15 0339  NA 143 143  K 4.1 4.1  CL 100* 100*  CO2 36* 36*  GLUCOSE 142* 131*  BUN 50* 53*  CREATININE 1.03* 1.02*  CALCIUM 8.7* 8.8*    PT/INR:  Recent Labs  02/15/15 0425  LABPROT 17.5*  INR 1.43   ABG    Component Value Date/Time   PHART 7.507* 02/05/2015 0442   HCO3 39.8* 02/05/2015 0442   TCO2 41 02/05/2015 0442   ACIDBASEDEF TEST WILL BE CREDITED 01/20/2015 0930   O2SAT 97.0 02/05/2015 0442   CBG (last 3)   Recent Labs  02/14/15 2033 02/14/15 2349 02/15/15 0430  GLUCAP 137* 98 104*    Meds Scheduled Meds: . acetylcysteine  3 mL Nebulization Q6H  . albuterol  2.5 mg Nebulization Q6H  . amiodarone  200 mg Oral BID  .  amLODipine  10 mg Per Tube Daily  . antiseptic oral rinse  7 mL Mouth Rinse q12n4p  . budesonide (PULMICORT) nebulizer solution  0.25 mg Nebulization Q6H WA  . chlorhexidine  15 mL Mouth Rinse BID  . free water  200 mL Per Tube Q6H  . furosemide  40 mg Intravenous Daily  . insulin aspart  0-24 Units Subcutaneous 6 times per day  . lidocaine  15 mL Mouth/Throat Once  . methylPREDNISolone (SOLU-MEDROL) injection  30 mg Intravenous Daily  . pantoprazole sodium  40 mg Per Tube Daily  . sodium chloride  10-40 mL Intracatheter Q12H  . warfarin  3 mg Per Tube q1800  . Warfarin - Physician Dosing Inpatient   Does not apply q1800   Continuous Infusions: . sodium chloride Stopped (01/30/15 1900)  . sodium chloride 10 mL/hr at 02/15/15 0016  . feeding supplement (VITAL AF 1.2 CAL) 1,000 mL (02/15/15 0016)   PRN Meds:.acetaminophen (TYLENOL) oral liquid 160 mg/5 mL, albuterol, metoprolol, ondansetron (ZOFRAN) IV, sodium chloride, traMADol  Xrays No results found.  Assessment/Plan: S/P Procedure(s) (LRB): AORTIC VALVE REPLACEMENT (AVR) (N/A) MAZE (N/A)  AORTIC ROOT ENLARGEMENT (N/A) TRANSESOPHAGEAL ECHOCARDIOGRAM (TEE) (N/A)  1 discussing options re: vocal cord paralysis, appreciate Dr. Lucky Rathke consult 2 pulm conts to assist with asthma management 3  afib with controlled rate, INR slow to rise 4 no vtach- amio is back to po 5 rehab as able 6 cont TF's  LOS: 27 days    Sandra Turner,Sandra Turner 02/15/2015  I have seen and examined the patient and agree with the assessment and plan as outlined.  Input from Dr Pollyann Kennedy appreciated and options discussed.  I am very reluctant to put this patient through any procedure that requires general anesthesia.  If any vocal cord medialization procedure were to be done I would favor endoscopic injection as a lesser invasive procedure.  However, it might make more sense to proceed with PEG placement and give her more time to recover on her own.  She was eating and  swallowing without difficulty for over a week AFTER her cardiac surgery on 01/19/2015.  She was reintubated for respiratory failure on 01/31/2015 and was found to have severe difficulty swallowing and hoarse voice since she was successfully extubated on 02/07/2015.  Will hold coumadin.  Purcell Nails 02/15/2015 8:46 AM

## 2015-02-15 NOTE — Progress Notes (Signed)
SLP Cancellation Note  Patient Details Name: Sandra Turner MRN: [id number] DOB: 1941-01-24   Cancelled treatment:       Reason Eval/Treat Not Completed: Other (comment). Following plans from ENT and cardiothoracic surgery regarding vocal fold paralysis.  Will plan to retest swallow function when decisions are complete.  Harlon Ditty, MA CCC-SLP [phone]  Claudine Mouton 02/15/2015, 8:20 AM

## 2015-02-16 LAB — PROTIME-INR
INR: 1.33 (ref 0.00–1.49)
PROTHROMBIN TIME: 16.6 s — AB (ref 11.6–15.2)

## 2015-02-16 LAB — GLUCOSE, CAPILLARY
GLUCOSE-CAPILLARY: 115 mg/dL — AB (ref 65–99)
GLUCOSE-CAPILLARY: 172 mg/dL — AB (ref 65–99)
Glucose-Capillary: 144 mg/dL — ABNORMAL HIGH (ref 65–99)
Glucose-Capillary: 123 mg/dL — ABNORMAL HIGH (ref 65–99)
Glucose-Capillary: 115 mg/dL — ABNORMAL HIGH (ref 65–99)
Glucose-Capillary: 122 mg/dL — ABNORMAL HIGH (ref 65–99)

## 2015-02-16 MED ORDER — PREDNISONE 20 MG PO TABS
40.0000 mg | ORAL_TABLET | Freq: Every day | ORAL | Status: DC
  Administered 2015-02-17 – 2015-02-18 (×2): 40 mg
  Filled 2015-02-16 (×3): qty 2

## 2015-02-16 MED ORDER — WARFARIN SODIUM 3 MG PO TABS
3.0000 mg | ORAL_TABLET | Freq: Every day | ORAL | Status: DC
  Administered 2015-02-16 – 2015-02-18 (×3): 3 mg via ORAL
  Filled 2015-02-16 (×4): qty 1

## 2015-02-16 MED ORDER — PREDNISONE 20 MG PO TABS: 40.0000 mg | ORAL_TABLET | Freq: Every day | ORAL | Status: DC

## 2015-02-16 MED ORDER — POTASSIUM CHLORIDE 20 MEQ/15ML (10%) PO SOLN
20.0000 meq | Freq: Two times a day (BID) | ORAL | Status: DC
  Administered 2015-02-16 – 2015-02-17 (×3): 20 meq
  Filled 2015-02-16 (×5): qty 15

## 2015-02-16 MED ORDER — FUROSEMIDE 10 MG/ML IJ SOLN
40.0000 mg | Freq: Two times a day (BID) | INTRAMUSCULAR | Status: DC
Start: 2015-02-16 — End: 2015-02-18
  Administered 2015-02-16 – 2015-02-17 (×3): 40 mg via INTRAVENOUS
  Filled 2015-02-16 (×6): qty 4

## 2015-02-16 NOTE — Progress Notes (Signed)
Given plans to wait and watch with pts vocal cord function, will plan to retest swallow tomorrow to determine if there has been improvement this week. MBS tomorrow.  Harlon Ditty, MA CCC-SLP [phone]

## 2015-02-16 NOTE — Progress Notes (Addendum)
[address]       [place] [address]             [phone]      28 Days Post-Op Procedure(s) (LRB): AORTIC VALVE REPLACEMENT (AVR) (N/A) MAZE (N/A)  AORTIC ROOT ENLARGEMENT (N/A) TRANSESOPHAGEAL ECHOCARDIOGRAM (TEE) (N/A) Subjective: Feels a little better, voice actually sounds a bit stronger  Objective: Vital signs in last 24 hours: Temp:  [97.6 F (36.4 C)-98.9 F (37.2 C)] 97.6 F (36.4 C) (06/15 0455) Pulse Rate:  [66-69] 66 (06/15 0933) Cardiac Rhythm:  [-] Atrial fibrillation (06/15 0750) Resp:  [18] 18 (06/15 0933) BP: (116-135)/(48-55) 116/55 mmHg (06/15 0455) SpO2:  [93 %-99 %] 96 % (06/15 0933) Weight:  [235 lb 0.2 oz (106.6 kg)] 235 lb 0.2 oz (106.6 kg) (06/15 0319)  Hemodynamic parameters for last 24 hours:    Intake/Output from previous day: 06/14 0701 - 06/15 0700 In: 3630.3 [I.V.:158.2; NG/GT:3472.2] Out: 1200 [Urine:1200] Intake/Output this shift: Total I/O In: 70 [NG/GT:70] Out: 300 [Urine:300]  General appearance: alert, cooperative and no distress Heart: irregularly irregular rhythm Lungs: dim in lower fields Abdomen: soft, non-tender Extremities: significant lower ext incis persists Wound: incis healing well  Lab Results:  Recent Labs  02/14/15 0339  WBC 13.4*  HGB 9.4*  HCT 30.0*  PLT 121*   BMET:  Recent Labs  02/14/15 0339  NA 143  K 4.1  CL 100*  CO2 36*  GLUCOSE 131*  BUN 53*  CREATININE 1.02*  CALCIUM 8.8*    PT/INR:  Recent Labs  02/16/15 0420  LABPROT 16.6*  INR 1.33   ABG    Component Value Date/Time   PHART 7.507* 02/05/2015 0442   HCO3 39.8* 02/05/2015 0442   TCO2 41 02/05/2015 0442   ACIDBASEDEF TEST WILL BE CREDITED 01/20/2015 0930   O2SAT 97.0 02/05/2015 0442   CBG (last 3)   Recent Labs  02/15/15 2017 02/15/15 2354 02/16/15 0412  GLUCAP 160* 144* 115*    Meds Scheduled Meds: . albuterol  2.5 mg Nebulization Q6H  . amiodarone  200 mg Oral BID  . amLODipine   10 mg Per Tube Daily  . antiseptic oral rinse  7 mL Mouth Rinse q12n4p  . budesonide (PULMICORT) nebulizer solution  0.25 mg Nebulization Q6H WA  . chlorhexidine  15 mL Mouth Rinse BID  . free water  200 mL Per Tube Q6H  . furosemide  40 mg Intravenous Daily  . insulin aspart  0-24 Units Subcutaneous 6 times per day  . lidocaine  15 mL Mouth/Throat Once  . methylPREDNISolone (SOLU-MEDROL) injection  30 mg Intravenous Daily  . pantoprazole sodium  40 mg Per Tube Daily  . sodium chloride  10-40 mL Intracatheter Q12H  . warfarin  3 mg Oral q1800  . Warfarin - Physician Dosing Inpatient   Does not apply q1800   Continuous Infusions: . sodium chloride Stopped (01/30/15 1900)  . sodium chloride 10 mL/hr at 02/15/15 0016  . feeding supplement (VITAL AF 1.2 CAL) 1,000 mL (02/16/15 0707)   PRN Meds:.acetaminophen (TYLENOL) oral liquid 160 mg/5 mL, albuterol, metoprolol, ondansetron (ZOFRAN) IV, sodium chloride, traMADol  Xrays Dg Chest 2 View  02/15/2015   CLINICAL DATA:  74 year old female status post aortic valve replacement on Jan 19 2015 with subsequent respiratory failure secondary to hospital acquired pneumonia. Severe atelectasis and mucous plugging with bilateral pleural effusions.  EXAM: CHEST  2 VIEW  COMPARISON:  Chest x-ray 02/12/2015.  FINDINGS: There is  a right upper extremity PICC with tip terminating in the distal superior vena cava. A feeding tube is seen extending into the abdomen, however, the tip of the feeding tube extends below the lower margin of the image. Lung volumes are low. Bibasilar opacities may reflect areas of atelectasis and/or consolidation, with superimposed moderate bilateral pleural effusions. Mild coarsening of interstitial markings is noted, asymmetrically distributed (right greater than left). No definite evidence of pulmonary edema. Heart size is borderline to mildly enlarged, likely accentuated by low lung volumes and portable technique. Atherosclerosis in the  thoracic aorta. Status post median sternotomy for aortic valve replacement and left atrial appendage ligation.  IMPRESSION: 1. Other than decreasing lung volumes, the radiographic appearance the chest is essentially unchanged, as detailed above.   Electronically Signed   By: Trudie Reed M.D.   On: 02/15/2015 08:15    Assessment/Plan: S/P Procedure(s) (LRB): AORTIC VALVE REPLACEMENT (AVR) (N/A) MAZE (N/A)  AORTIC ROOT ENLARGEMENT (N/A) TRANSESOPHAGEAL ECHOCARDIOGRAM (TEE) (N/A)  1 watchful waiting for now with vocal cord paralysis 2 hemodyn stable with rate controlled afib 3 no new labs 4 push rehab 5 medical management - cont current, coumadin currently on hold    LOS: 28 days    GOLD,WAYNE E 02/16/2015  I have seen and examined the patient and agree with the assessment and plan as outlined.  I agree w/ plans to continue to hold off on any intervention.  If she has no significant improvement in swallowing over the next 5-7 days then I would favor endoscopic vocal chord injection in attempt to avoid PEG rather than a more complicated open vocal chord medialization procedure.  Will ask SLP team to reassess swallowing next Monday or sooner if there are significant clinical signs of improvement.  Will resume coumadin for now.  Purcell Nails 02/16/2015 3:33 PM

## 2015-02-16 NOTE — Progress Notes (Signed)
[facility]  S: feeling much better.  Sitting OOB in chair.  Denies SOB.  BLE edema still much more than baseline per pt.    O:  Filed Vitals:   02/16/15 0300 02/16/15 0319 02/16/15 0455 02/16/15 0933  BP:   116/55   Pulse:   66 66  Temp:   97.6 F (36.4 C)   TempSrc:   Oral   Resp:   18 18  Height:      Weight:  235 lb 0.2 oz (106.6 kg)    SpO2: 93%  96% 96%   1L [place]  Gen: sitting up in chair, no distress HENT: NCAT, EOMi PULM: CTA B, normal effort CV: RRR, 2+ pitting edema GI: BS+, soft, nontender Derm: no cyanosis or rash. Edema noted Psyche: normal mood and affect  CXR >> no new film 6/15  Labs >>no new labs 6/14  6/5 BAL fungal culture pending>>>    Intake/Output Summary (Last 24 hours) at 02/16/15 1134 Last data filed at 02/16/15 1110  Gross per 24 hour  Intake 3820.34 ml  Output   1500 ml  Net 2320.34 ml    Impression/Plan:  1) S/P AVR, MAZE: seems slightly volume up on exam, agree with lasix per TCTS, making slow progress, but remains i/o POS 2) Afib, s/p MAZE: in sinus rhythm on PO amiodarone, coumadin per pharmacy  3) Acute asthma flare with likely underlying fungal airways disease or possibly hypersensitivity pneumonitis> tolerating decreased dose of solumedrol.  Fungal culture pending.  -change solumedrol to PO prednisone with taper over one week- 40mg  x 2 days then 30mg  x 2 days then 10mg  and continue on that dose until follow up in the office -Continue budesonide nebulized would discharge home on this medication 0.5mg  bid -CXR prn -wean O2 off as able, goal O2 > 90% -outpt pulm f/u  4) Left vocal cord paralysis, evaluated by ENT> voice now improving, hopefully can hold off   Dirk Dress, NP 02/16/2015  11:34 AM Pager: [phone] or [phone]   Attending:  I have seen and examined the patient with nurse practitioner/resident and agree with the note above.   Lungs sound great today, she is tolerating the lower dose of  prednisone Hopefully her swallowing will improve as her voice sounds stronger today, hopefully this means she can avoid any procedure.  If on laryngoscopy she still has paralysis would favor medialization to minimize risk of aspiration.  Will follow  Heber Stilwell, MD [place] [facility] Pager: [phone] Cell: [phone] After 3pm or if no response, call [phone]

## 2015-02-16 NOTE — Progress Notes (Signed)
CARDIAC REHAB PHASE I   PRE:  Rate/Rhythm: 73 afib    BP: sitting 127/48    SaO2: 95 1L  MODE:  Ambulation: 24 ft outside room   POST:  Rate/Rhythm: 83 afib    BP: sitting 120/49      SaO2: 91 2L  Pt declined ambulating in am but stated she would walk after lunch. Ready when I came but only agreeable to short walk with RW (declined using rollator and sitting to rest). Pt steady and moves without assist but very DOE. Breathing became more labored with distance. Rest x3 short standing stops. Encouraged pt to slow breathing. Pt is deconditioned and prob has component of anxiety. SaO2 91 2L after walking. Encouraged IS and flutter (pt prefers flutter). Encouraged pt to walk x3 a day, even if short walks. Will f/u. [phone]   Sandra Turner CES, ACSM 02/16/2015 1:24 PM

## 2015-02-16 NOTE — Progress Notes (Signed)
Pt requesting cup of ice water to swish and spit at this time; due to pt being completely NPO ST called for input; ok to give pt water as long as pt does not swallow water; pt is alert and oriented; will cont. To monitor.

## 2015-02-16 NOTE — Progress Notes (Signed)
Patient ID: Sandra Turner, female   DOB: 08-01-1941, 74 y.o.   MRN: [id number]   After discussion with pulmonary and cardiovascular surgery, and then a lengthy discussion with the patient this morning, we have decided just to wait a few more days and see how she is feeling. If her larynx shows any signs of signs of improvement, then intervention will not be needed. If her larynx does not improve but her pulmonary function improves, then surgical intervention would be an easier decision. She is currently being fed with nasogastric feeding, and is not in any danger. I think this is a reasonable approach. I will reevaluate on Monday.

## 2015-02-17 ENCOUNTER — Inpatient Hospital Stay (HOSPITAL_COMMUNITY): Payer: Medicare Other

## 2015-02-17 LAB — GLUCOSE, CAPILLARY
GLUCOSE-CAPILLARY: 109 mg/dL — AB (ref 65–99)
GLUCOSE-CAPILLARY: 130 mg/dL — AB (ref 65–99)
GLUCOSE-CAPILLARY: 133 mg/dL — AB (ref 65–99)
Glucose-Capillary: 109 mg/dL — ABNORMAL HIGH (ref 65–99)
Glucose-Capillary: 117 mg/dL — ABNORMAL HIGH (ref 65–99)
Glucose-Capillary: 168 mg/dL — ABNORMAL HIGH (ref 65–99)

## 2015-02-17 LAB — CBC
HEMATOCRIT: 29.5 % — AB (ref 36.0–46.0)
Hemoglobin: 9.4 g/dL — ABNORMAL LOW (ref 12.0–15.0)
MCH: 30.1 pg (ref 26.0–34.0)
MCHC: 31.9 g/dL (ref 30.0–36.0)
MCV: 94.6 fL (ref 78.0–100.0)
PLATELETS: 113 10*3/uL — AB (ref 150–400)
RBC: 3.12 MIL/uL — ABNORMAL LOW (ref 3.87–5.11)
RDW: 16.3 % — ABNORMAL HIGH (ref 11.5–15.5)
WBC: 10.3 10*3/uL (ref 4.0–10.5)

## 2015-02-17 LAB — PROTIME-INR
INR: 1.36 (ref 0.00–1.49)
Prothrombin Time: 16.8 seconds — ABNORMAL HIGH (ref 11.6–15.2)

## 2015-02-17 LAB — BASIC METABOLIC PANEL
Anion gap: 9 (ref 5–15)
BUN: 51 mg/dL — ABNORMAL HIGH (ref 6–20)
CO2: 35 mmol/L — AB (ref 22–32)
CREATININE: 0.9 mg/dL (ref 0.44–1.00)
Calcium: 9 mg/dL (ref 8.9–10.3)
Chloride: 96 mmol/L — ABNORMAL LOW (ref 101–111)
GLUCOSE: 109 mg/dL — AB (ref 65–99)
Potassium: 3.7 mmol/L (ref 3.5–5.1)
SODIUM: 140 mmol/L (ref 135–145)

## 2015-02-17 NOTE — Progress Notes (Signed)
Pt is refusing SNF placement at this time.  [facility] [place] was patients choice for rehab and they will continue to follow patient in case the patient changes her mind.  CSW signing off for now- please reconsult if pt becomes agreeable to SNF placement.  Merlyn Lot, LCSWA Clinical Social Worker [phone]

## 2015-02-17 NOTE — Progress Notes (Signed)
[place] P[facility]M Progress Note  S:   Pt up to chair, denies SOB / cough.  Working on flutter valve.  Reports minimal change in LE swelling   O:  Filed Vitals:   02/16/15 2010 02/17/15 0100 02/17/15 0521 02/17/15 0746  BP: 120/50  133/42   Pulse: 67 67 71   Temp: 97.9 F (36.6 C)  98.2 F (36.8 C)   TempSrc: Oral  Oral   Resp: Height:      Weight:   233 lb 11 oz (106 kg)   SpO2: 96% 100% 95% 94%   1L [place]  EXAM:  Gen: sitting up in chair, no distress HENT: NCAT, EOMi PULM: CTA B, normal effort CV: RRR, 2+ pitting edema, extensive resolving ecchymosis to chest wall  GI: BS+, soft, nontender Derm: no cyanosis or rash. Edema noted Psyche: normal mood and affect  CXR >> no new film 6/16  LABS:  Recent Labs Lab 02/13/15 0500 02/14/15 0339 02/17/15 0610  HGB 9.1* 9.4* 9.4*  HCT 29.1* 30.0* 29.5*  WBC 13.2* 13.4* 10.3  PLT 128* 121* 113*    Recent Labs Lab 02/11/15 0600 02/12/15 0435 02/13/15 0500 02/14/15 0339 02/15/15 0425 02/17/15 0610  NA 147* 147* 143 143  --  140  K 3.6 3.8 4.1 4.1  --  3.7  CL 100* 101 100* 100*  --  96*  CO2 36* 35* 36* 36*  --  35*  GLUCOSE 122* 109* 142* 131*  --  109*  BUN 52* 51* 50* 53*  --  51*  CREATININE 1.08* 1.05* 1.03* 1.02*  --  0.90  CALCIUM 8.7* 8.9 8.7* 8.8*  --  9.0  MG  --  2.5* 2.5* 2.5* 2.4  --   PHOS  --  3.2 3.8 3.6 3.6  --     6/5 BAL fungal culture pending >> no yeast / fungal on prelim >>     Intake/Output Summary (Last 24 hours) at 02/17/15 0944 Last data filed at 02/17/15 0944  Gross per 24 hour  Intake 3424.83 ml  Output   3575 ml  Net -150.17 ml    Impression/Plan:  1) S/P AVR, MAZE -  volume up on exam, agree with lasix per TCTS, making slow progress, but remains i/o POS 2) Afib, s/p MAZE - in sinus rhythm on PO amiodarone, coumadin per pharmacy   3) Acute Asthma Flare - with likely underlying fungal airways disease or possibly hypersensitivity pneumonitis > tolerating decreased dose of  solumedrol.  Fungal culture pending.   Plan: -PO prednisone with taper over one week -  x 2 days then  x 2 days then  and continue on that dose until follow up in the office -Continue nebulized budesonide,  would discharge home on this medication 0.5mg  bid -CXR PRN -wean O2 off as able, goal O2 > 90% -outpt pulm f/u   4) Left vocal cord paralysis, evaluated by ENT> voice now improving.  Waiting a few more days to follow improvement - pending reevaluation on Monday 6/20  5) Dysphagia - pending swallow evaluation 6/16, NPO.  TF per nutrition.  Free water    Canary Brim, NP-C [facility] Pgr: [phone] or [phone] 02/17/2015  9:44 AM    Attending:  I have seen and examined the patient with nurse practitioner/resident and agree with the note above.   She looks great today, voice stronger, no wheezing, eating "the best meal of [her] life".  Continue prednisone and  taper as detailed above, she has follow up with me arranged on July 8.    Will be available prn  Heber [place], MD [place] [facility] Pager: [phone] Cell: [phone] After 3pm or if no response, call [phone]

## 2015-02-17 NOTE — Progress Notes (Signed)
Physical Therapy Treatment Patient Details Name: Sandra Turner MRN: [id number] DOB: 1941-04-25 Today's Date: 02/17/2015    History of Present Illness Sandra Turner is a 74 year old female adm with dyspnea, severe aortic stenosis, and atrial fibrillation. 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 Intubated 01/31/15 with likely due to HCAP, severe atelectasis w/ mucous plugging, required multiple bronchoscopy with lavage due to mucous plugging, bil chest tubes placed, extubated 6/6    PT Comments    Limited treatment today. Pt declines to ambulate despite my best efforts however, she was willing to participate in therapeutic exercises, which she tolerated well. We reviewed precautions, and energy conservation techniques together (unable to recall sternal precautions without assistance.) Encouraged Sandra Turner to ambulate this evening with nursing staff. Patient will continue to benefit from skilled physical therapy services to further improve independence with functional mobility.   Follow Up Recommendations  SNF;Supervision/Assistance - 24 hour (If pt refuses SNF would recommend HHPT and aide)     Equipment Recommendations  Rolling walker with 5" wheels;3in1 (PT)    Recommendations for Other Services       Precautions / Restrictions Precautions Precautions: Fall;Sternal Precaution Comments: Reviewed sternal precautions. Unable to recall Restrictions Weight Bearing Restrictions: Yes Other Position/Activity Restrictions: Sternal precautions    Mobility  Bed Mobility               General bed mobility comments: Patient in chair as PT entered room  Transfers                    Ambulation/Gait                 Stairs            Wheelchair Mobility    Modified Rankin (Stroke Patients Only)       Balance                                    Cognition Arousal/Alertness: Awake/alert Behavior During Therapy: WFL for  tasks assessed/performed Overall Cognitive Status: Within Functional Limits for tasks assessed                      Exercises General Exercises - Lower Extremity Ankle Circles/Pumps: AROM;Both;10 reps;Seated Quad Sets: Strengthening;Both;10 reps;Seated Gluteal Sets: Strengthening;Both;10 reps;Seated Long Arc Quad: Strengthening;Both;10 reps;Seated Heel Slides: AAROM;Strengthening;Both;10 reps;Seated Hip ABduction/ADduction: AAROM;Both;Strengthening;10 reps (reclined in chair) Hip Flexion/Marching: Strengthening;Both;10 reps;Seated    General Comments General comments (skin integrity, edema, etc.): Reviewed post-op handout. Included energy conservation techniques, safe mobility, abnormal symptoms, and sternal precautions. Pt unwilling to ambulate despite my best efforts. States she has been up frequently today, agreeable to exercises only.      Pertinent Vitals/Pain Pain Assessment: Faces Faces Pain Scale: Hurts little more Pain Location: sternum Pain Descriptors / Indicators: Aching Pain Intervention(s): Monitored during session;Repositioned  HR 80s SpO2 95% on room air    Home Living                      Prior Function            PT Goals (current goals can now be found in the care plan section) Acute Rehab PT Goals PT Goal Formulation: With patient Time For Goal Achievement: 02/16/15 Potential to Achieve Goals: Fair Progress towards PT goals: Progressing toward goals    Frequency  Min 3X/week    PT Plan Current  plan remains appropriate    Co-evaluation             End of Session   Activity Tolerance: Other (comment) (Self limiting, unwilling to ambulate at this time) Patient left: in chair;with call bell/phone within reach     Time: 5409-8119 PT Time Calculation (min) (ACUTE ONLY): 22 min  Charges:  $Therapeutic Exercise: 8-22 mins                    G Codes:      Berton Mount 03-17-2015, 6:16 PM [place],  [place] [phone]

## 2015-02-17 NOTE — Progress Notes (Signed)
MBSS complete. Full report located under chart review in imaging section. Jazmene Racz, MA CCC-SLP [phone]  

## 2015-02-17 NOTE — Progress Notes (Signed)
      [address]       [place] [address]             [phone]      29 Days Post-Op Procedure(s) (LRB): AORTIC VALVE REPLACEMENT (AVR) (N/A) MAZE (N/A)  AORTIC ROOT ENLARGEMENT (N/A) TRANSESOPHAGEAL ECHOCARDIOGRAM (TEE) (N/A) Subjective: Voice conts to improve  Objective: Vital signs in last 24 hours: Temp:  [97.9 F (36.6 C)-98.3 F (36.8 C)] 98.2 F (36.8 C) (06/16 0521) Pulse Rate:  [66-71] 71 (06/16 0521) Cardiac Rhythm:  [-] Atrial fibrillation (06/15 2057) Resp:  [18] 18 (06/16 0521) BP: (120-133)/(42-50) 133/42 mmHg (06/16 0521) SpO2:  [94 %-100 %] 94 % (06/16 0746) Weight:  [233 lb 11 oz (106 kg)] 233 lb 11 oz (106 kg) (06/16 0521)  Hemodynamic parameters for last 24 hours:    Intake/Output from previous day: 06/15 0701 - 06/16 0700 In: 3494.8 [I.V.:116; NG/GT:3378.8] Out: 3075 [Urine:3075] Intake/Output this shift:    General appearance: alert, cooperative and no distress Heart: irregularly irregular rhythm Lungs: clear to auscultation bilaterally Abdomen: benign Extremities: edema- stable Wound: 'incis healing well  Lab Results:  Recent Labs  02/17/15 0610  WBC 10.3  HGB 9.4*  HCT 29.5*  PLT PENDING   BMET:  Recent Labs  02/17/15 0610  NA 140  K 3.7  CL 96*  CO2 35*  GLUCOSE 109*  BUN 51*  CREATININE 0.90  CALCIUM 9.0    PT/INR:  Recent Labs  02/17/15 0610  LABPROT 16.8*  INR 1.36   ABG    Component Value Date/Time   PHART 7.507* 02/05/2015 0442   HCO3 39.8* 02/05/2015 0442   TCO2 41 02/05/2015 0442   ACIDBASEDEF TEST WILL BE CREDITED 01/20/2015 0930   O2SAT 97.0 02/05/2015 0442   CBG (last 3)   Recent Labs  02/17/15 0034 02/17/15 0442 02/17/15 0808  GLUCAP 117* 109* 130*    Meds Scheduled Meds: . albuterol  2.5 mg Nebulization Q6H  . amiodarone  200 mg Oral BID  . amLODipine  10 mg Per Tube Daily  . antiseptic oral rinse  7 mL Mouth Rinse q12n4p  . budesonide (PULMICORT) nebulizer  solution  0.25 mg Nebulization Q6H WA  . chlorhexidine  15 mL Mouth Rinse BID  . free water  200 mL Per Tube Q6H  . furosemide  40 mg Intravenous BID  . insulin aspart  0-24 Units Subcutaneous 6 times per day  . lidocaine  15 mL Mouth/Throat Once  . pantoprazole sodium  40 mg Per Tube Daily  . potassium chloride  20 mEq Per Tube BID  . predniSONE  40 mg Per Tube Q breakfast  . sodium chloride  10-40 mL Intracatheter Q12H  . warfarin  3 mg Oral q1800  . Warfarin - Physician Dosing Inpatient   Does not apply q1800   Continuous Infusions: . sodium chloride Stopped (01/30/15 1900)  . sodium chloride 10 mL/hr at 02/15/15 0016  . feeding supplement (VITAL AF 1.2 CAL) 1,000 mL (02/16/15 2221)   PRN Meds:.acetaminophen (TYLENOL) oral liquid 160 mg/5 mL, albuterol, metoprolol, ondansetron (ZOFRAN) IV, sodium chloride, traMADol  Xrays No results found.  Assessment/Plan: S/P Procedure(s) (LRB): AORTIC VALVE REPLACEMENT (AVR) (N/A) MAZE (N/A)  AORTIC ROOT ENLARGEMENT (N/A) TRANSESOPHAGEAL ECHOCARDIOGRAM (TEE) (N/A)  1 she is remarkably stable , slow physical recovery conts. Plans outlined. Cont current managent     LOS: 29 days    Juliana Boling E 02/17/2015

## 2015-02-17 NOTE — Progress Notes (Signed)
02/17/2015 1715 Paged Coral Ceo PA to let her know pt passed the MBS earlier and was put on a Dys. III diet.  Told her I stopped the TF and asked if she wanted me to pull the NGT.  Orders received to leave NGT in place until AM. Kathryne Hitch

## 2015-02-17 NOTE — Progress Notes (Signed)
CARDIAC REHAB PHASE I   Pt in testing when attempted to see earlier this morning. Pt sitting up in chair, declines ambulation at this time. Pt states she was given lasix and has "been up and down all morning" and is "tired."  Offered to come back this afternoon, pt refused. Reiterated importance of ambulation to pt, pt verbalized understanding. Pt encouraged to walk, pt states  "I'll see how I feel tonight and might ask my nurse."  Will try again tomorrow.     Joylene Grapes, RN, BSN 02/17/2015 1:16 PM

## 2015-02-17 NOTE — Progress Notes (Signed)
02/17/2015 1830 Attempted to walk the pt several times today.  Pt stated each time that she would walk later on tonight.  Discussed with pt. The importance of walking.  Pt voiced understanding. Kathryne Hitch

## 2015-02-17 NOTE — Progress Notes (Signed)
Utilization review completed.  

## 2015-02-18 LAB — GLUCOSE, CAPILLARY
GLUCOSE-CAPILLARY: 94 mg/dL (ref 65–99)
GLUCOSE-CAPILLARY: 210 mg/dL — AB (ref 65–99)
Glucose-Capillary: 111 mg/dL — ABNORMAL HIGH (ref 65–99)
Glucose-Capillary: 141 mg/dL — ABNORMAL HIGH (ref 65–99)
Glucose-Capillary: 146 mg/dL — ABNORMAL HIGH (ref 65–99)
Glucose-Capillary: 89 mg/dL (ref 65–99)

## 2015-02-18 LAB — PROTIME-INR
INR: 1.33 (ref 0.00–1.49)
PROTHROMBIN TIME: 16.6 s — AB (ref 11.6–15.2)

## 2015-02-18 MED ORDER — FUROSEMIDE 10 MG/ML IJ SOLN: 80.0000 mg | Freq: Two times a day (BID) | INTRAMUSCULAR | Status: DC

## 2015-02-18 MED ORDER — POTASSIUM CHLORIDE 20 MEQ/15ML (10%) PO SOLN: 20.0000 meq | Freq: Two times a day (BID) | ORAL | Status: DC

## 2015-02-18 MED ORDER — PREDNISONE 20 MG PO TABS
40.0000 mg | ORAL_TABLET | Freq: Every day | ORAL | Status: DC
  Administered 2015-02-19 – 2015-02-21 (×3): 40 mg via ORAL
  Filled 2015-02-18 (×4): qty 2

## 2015-02-18 MED ORDER — STARCH (THICKENING) PO POWD
ORAL | Status: DC | PRN
  Filled 2015-02-18: qty 227

## 2015-02-18 MED ORDER — AMLODIPINE BESYLATE 10 MG PO TABS
10.0000 mg | ORAL_TABLET | Freq: Every day | ORAL | Status: DC
  Administered 2015-02-18 – 2015-02-21 (×4): 10 mg via ORAL
  Filled 2015-02-18 (×4): qty 1

## 2015-02-18 MED ORDER — POTASSIUM CHLORIDE 20 MEQ/15ML (10%) PO SOLN
40.0000 meq | Freq: Two times a day (BID) | ORAL | Status: DC
  Administered 2015-02-18 – 2015-02-19 (×4): 40 meq via ORAL
  Filled 2015-02-18 (×6): qty 30

## 2015-02-18 MED ORDER — AMIODARONE HCL 200 MG PO TABS
200.0000 mg | ORAL_TABLET | Freq: Every day | ORAL | Status: DC
  Administered 2015-02-18 – 2015-02-21 (×4): 200 mg via ORAL
  Filled 2015-02-18 (×4): qty 1

## 2015-02-18 MED ORDER — INSULIN ASPART 100 UNIT/ML ~~LOC~~ SOLN
0.0000 [IU] | Freq: Three times a day (TID) | SUBCUTANEOUS | Status: DC
  Administered 2015-02-18: 7 [IU] via SUBCUTANEOUS

## 2015-02-18 MED ORDER — ACETAMINOPHEN 160 MG/5ML PO SOLN
650.0000 mg | Freq: Four times a day (QID) | ORAL | Status: DC | PRN
  Filled 2015-02-18: qty 20.3

## 2015-02-18 MED ORDER — TRAMADOL HCL 50 MG PO TABS
50.0000 mg | ORAL_TABLET | ORAL | Status: DC | PRN
Start: 2015-02-18 — End: 2015-02-21
  Administered 2015-02-20: 50 mg via ORAL
  Filled 2015-02-18: qty 1

## 2015-02-18 MED ORDER — FUROSEMIDE 10 MG/ML IJ SOLN
80.0000 mg | Freq: Two times a day (BID) | INTRAMUSCULAR | Status: DC
  Administered 2015-02-18 – 2015-02-20 (×5): 80 mg via INTRAVENOUS
  Filled 2015-02-18 (×7): qty 8

## 2015-02-18 NOTE — Discharge Summary (Signed)
Physician Discharge Summary       [address] [place]       [name] [id number]             [phone]    Patient ID: Sandra Turner MRN: [id number] DOB/AGE: [birth year] 73 y.o.  Admit date: 01/19/2015 Discharge date: 02/20/2015  Admission Diagnoses: 1. Severe aortic stenosis 2. Chronic atrial fibrillation 3. History of obesity 4. History of bronchitis (COPD) 5. History of back pain  Discharge Diagnoses:  1. Severe aortic stenosis 2. Chronic atrial fibrillation 3. History of obesity 4. History of bronchitis (COPD) 5. Tracheomalacia 6. History of back pain 7. ABL anemia 8. Mild thrombocytopenia 9. Respiratory failure 10. Left vocal cord paralysis  Consults: pulmonary/intensive care (Dr. Vassie Loll)  and EPS (Dr. Graciela Husbands) And ENT (Dr. Pollyann Kennedy)  Procedure (s):   1. Bronchoscopy by Dr. Kendrick Fries on 02/06/2015 2. Bilateral chest tube placement by Dr. Cornelius Moras on 02/04/2015.  3. Aortic Valve Replacement with Aortic Root Enlargement [facility] Ease Bovine Bioprosthetic Tissue Valve (size 23mm, model #3300TFX, serial #[id number]) Bovine pericardial patch enlargement of aortic root (Nicks' Procedure)   Maze Procedure complete bilaterall atrial lesion set using bipolar radiofrequency and cryothermy ablation clipping of left atrial appendage (Atriclip size 40mm) by Dr. Cornelius Moras on 01/19/2015.  History of Presenting Illness: This is a 74 year old obese white female from [place] referred to discuss treatment options for management of severe symptomatic aortic stenosis. The patient's cardiac history dates back to December 2013 when she was hospitalized acutely at [facility] with acute exacerbation of shortness of breath. She was found to be in atrial fibrillation and transthoracic echocardiogram demonstrated what was felt to be moderate aortic stenosis with signs of pulmonary hypertension. Left ventricular systolic function was  normal. She was initially evaluated by [name]. The patient recalls that she was told she probably had pneumonia at that time. She refused anticoagulation using warfarin at that time. She has been followed ever since through the [facility] office in [place], most recently by [name]. She has remained in atrial fibrillation, and recently she has been chronically anticoagulated using Pradaxa. Follow-up echocardiograms have demonstrated progression of the severity of aortic stenosis. Echocardiogram performed 04/19/2014 revealed normal left ventricular systolic function with ejection fraction estimated 60-65% and severe aortic stenosis with mean transvalvular gradient estimated 56 mmHg. She was referred to the multidisciplinary heart valve clinic and was seen in consultation by Dr. [name] on 05/12/2014. At that time the patient was reluctant to proceed with any further diagnostic evaluation and plans were made for close follow-up on medical therapy. Follow-up echocardiogram performed 08/30/2014 revealed preserved left ventricular systolic function with ejection fraction estimated 55-60%. There remained severe to "critical" aortic stenosis with mean velocity across the aortic valve estimated 48 mmHg. The patient was hospitalized for several days in early January with progressive shortness of breath, low-grade fever, and productive cough. She was treated for presumed acute bronchitis and COPD exacerbation, and chest radiographs suggests the possibility of community acquired pneumonia. Symptoms reportedly improve with antibiotics. She was seen in follow-up in early February by Dr. [name] and agreed to proceed with left and right heart catheterization to further evaluate the severity of aortic stenosis. Catheterization confirmed the presence of aortic stenosis with peak-to-peak and mean transvalvular gradients measured 43 and 29 mmHg, respectively. The patient did not have any significant  coronary artery disease. There was mild to moderate pulmonary hypertension. The patient has been referred for surgical consultation to discuss treatment options further. She was  originally seen in consultation on 11/22/2014 and she was seen most recently on 12/27/2014. At that time we made a definitive plans for elective aortic valve replacement and maze procedure on Wednesday, 01/19/2015. The patient returns for routine follow-up today prior to surgery. She reports no new problems or complaints over the past 2 weeks. She stopped taking Pradaxa last week in anticipation of surgery.  The patient is single and lives alone in [place]. 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 She is divorced but has no children. She has several siblings who live in [place]. 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 She describes stable symptoms of exertional shortness of breath. She states that her physical mobility is limited more by arthritis in her knees and her shortness of breath, and typically her breathing reportedly does not bother her with ordinary day-to-day activities. She denies any history of chest pain or chest tightness either with activity or at rest. She has not had palpitations, dizzy spells, nor syncope. She denies any history of resting shortness of breath, PND, orthopnea. The patient has never been a smoker and she denies any significant second hand smoke exposure. She has been treated for chronic relapsing bronchitis and told that she has COPD. The patient reports a family history of malignant hyperthermia with general anesthesia.  Patient has stage D severe symptomatic aortic stenosis. She has been followed for the last 3 years with aortic stenosis and atrial fibrillation.  She initially refused Coumadin therapy, but more recently she has been anticoagulated using Pradaxa. She was hospitalized in early January of this year with progressive shortness of breath, low-grade fever, and a productive cough consistent with likely acute bronchitis and COPD exacerbation with possible community-acquired pneumonia. Symptoms have improved since then and the patient has remained clinically stable with chronic exertional shortness of breath consistent with chronic diastolic congestive heart failure, currently New York Heart Association functional class II. Dr. Cornelius Moras personally reviewed the patient's recent transthoracic echocardiogram and diagnostic catheterization. Transthoracic echocardiogram confirms findings consistent with severe calcific aortic stenosis. There is severe thickening and calcification involving all 3 leaflets of the aortic valve. Peak velocity across the aortic valve measures anywhere between 4.8 and 5.0 m/s corresponding to mean transvalvular gradient approaching 50 mmHg, with hemodynamics further affected by the patient's underlying chronic persistent atrial fibrillation. Left ventricular systolic function remains preserved with ejection fraction estimated between 55 and 60%. Cardiac catheterization is notable for the absence of significant coronary artery disease and mild to moderate pulmonary hypertension. CT angiogram of the chest performed in 2013 demonstrates normal size of the ascending thoracic aorta without significant calcification or plaque within the aortic root.  Dr. Cornelius Moras discussed the need for aortic valve replacement using a bioprosthetic tissue valve and Maze procedure on 01/19/2015. There is a possibility that aortic root enlargement or aortic root replacement may be necessary to avoid the potential for patient prosthetic mismatch. Potential risks, benefits, and complications were discussed and the patient agreed to proceed with surgery. She underwent  an  aortic valve replacement with aortic root enlargement and MAZE on 01/19/2015.  Brief Hospital Course:  The patient was extubated the morning of post operative day one. She remained afebrile and hemodynamically stable. She initially was AAI paced. Theone Murdoch, a line, and chest tubes, Foley was removed  later in the post operative course as she was being diuresed. She was weaned off the insulin drip. The patient's glucose remained well controlled. The patient's HGA1C pre op was 5.3. She went into a fib with CVR. She was put on an Amiodarone drip. She later converted and was switched to oral Amiodarone. She was volume over loaded and diuresed with a Lasix drip initially. She was started on Coumadin. Her PT and INR were monitored closely. Her last PT and INR was 1.47. She later required placement of a PICC line. She had ABL anemia. She did require a post op transfusion. Her last H and H was 9.4 and 29.5. She had worsening shortness of breath, hypoxemia. She required Bi pap and neb treatments. Echo done 05/23 showed normal AV function and no significant pericardial effusion. Pulmonary/CCM was consulted. Non contrast CT of the chest showed sever bibasilar atelectasis and small to moderate pleural effusions R>L. Pacing wires were removed on 05/25. Lasix drip was stopped on 05/26. She was put on a heparin drip as she went back into a fib. She then developed leukocytosis. Empiric Maxipime was started.  Her creatinine began to increase.  It went as high as 3.10. Diuresis and Lisinopril were stopped. PT assisted with ambulation. Her respiratory status worsened and she required re intubation. She underwent a bronchoscopy and removal of mucous plugs. A line was also placed on 05/30.  Vanco was then added for presumptive HCAP. She then required bilateral chest tube placement for pleural effusions on 06/03. She underwent a bronchoscopy again on 06/05 to clear endobronchial secretions. She was extubated on 06/06. She did have  hoarseness and then developed swallowing difficulties. Swallow evaluation was initially done on 06/07. She was made NPO, Panda was placed, and she received tube feedings. The patient was felt surgically stable for transfer from the ICU to PCTU for further convalescence on 02/11/2015. EPS consult was obtained on 06/13 for monmorphic VT, which was later re interpreted as a fib with motion artifact. ENT consult was obtained with Dr. Pollyann Kennedy on 06/13. She was found to have left vocal cord paralysis. Repeat swallow studies were done. Finally, on 06/16 she passed and was put on a dysphagia III diet honey thick diet.  She continues to progress with cardiac rehab. She was ambulating on room air. She passed her swallow study on 0616 and was placed on a dysphagia III diet. Her hoarseness continued to improve. Sutures will be removed prior to discharge. The patient is felt surgically stable for discharge today.  Latest Vital Signs: Blood pressure 138/47, pulse 65, temperature 98.2 F (36.8 C), temperature source Oral, resp. rate 18, height 5\' 2"  (1.575 m), weight 225 lb 11.2 oz (102.377 kg), SpO2 97 %.  Physical Exam: Cardiovascular: IRRR IRRR Pulmonary: Diminished at bases; no rales, wheezes, or rhonchi. Abdomen: Soft, non tender, bowel sounds present. Extremities: Bilateral +++ lower extremity edema. Wounds: Clean and dry. No erythema or signs of infection.  Discharge Condition:Stable and discharged to SNF.  Recent laboratory studies:  Lab Results  Component Value Date   WBC 10.3 02/17/2015   HGB 9.4* 02/17/2015   HCT 29.5* 02/17/2015   MCV 94.6 02/17/2015   PLT 113* 02/17/2015   Lab Results  Component Value Date   NA 137 02/20/2015   K 4.2 02/20/2015   CL 98* 02/20/2015   CO2 31 02/20/2015   CREATININE 1.29* 02/20/2015   GLUCOSE 146* 02/20/2015      Diagnostic Studies: Dg Chest 2 View  02/15/2015  CLINICAL DATA:  74 year old female status post aortic valve replacement on Jan 19 2015 with  subsequent respiratory failure secondary to hospital acquired pneumonia. Severe atelectasis and mucous plugging with bilateral pleural effusions.  EXAM: CHEST  2 VIEW  COMPARISON:  Chest x-ray 02/12/2015.  FINDINGS: There is a right upper extremity PICC with tip terminating in the distal superior vena cava. A feeding tube is seen extending into the abdomen, however, the tip of the feeding tube extends below the lower margin of the image. Lung volumes are low. Bibasilar opacities may reflect areas of atelectasis and/or consolidation, with superimposed moderate bilateral pleural effusions. Mild coarsening of interstitial markings is noted, asymmetrically distributed (right greater than left). No definite evidence of pulmonary edema. Heart size is borderline to mildly enlarged, likely accentuated by low lung volumes and portable technique. Atherosclerosis in the thoracic aorta. Status post median sternotomy for aortic valve replacement and left atrial appendage ligation.  IMPRESSION: 1. Other than decreasing lung volumes, the radiographic appearance the chest is essentially unchanged, as detailed above.   Electronically Signed   By: Trudie Reed M.D.   On: 02/15/2015 08:15    Ct Chest Wo Contrast  01/25/2015   CLINICAL DATA:  Difficulty breathing post cardiac valve replacement.  EXAM: CT CHEST WITHOUT CONTRAST  TECHNIQUE: Multidetector CT imaging of the chest was performed following the standard protocol without IV contrast.  COMPARISON:  CT 08/26/2012 and chest x-ray 09/13/2014  FINDINGS: Right IJ central venous catheter has tip in the SVC. Sternotomy wires are present. Examination demonstrates a small left effusion and moderate size right pleural effusion with associated bibasilar consolidation likely compressive atelectasis, although cannot exclude infection. There findings suggesting dependent debris within the trachea which may be due to aspiration.  There is mild to moderate cardiomegaly. There is  evidence of patient's recent aortic valve replacement. There is mild atherosclerotic coronary artery disease. There is a small amount of pericardial fluid. There are a few minimally prominent mediastinal lymph nodes unchanged and likely reactive. There is mild atherosclerotic plaque involving the thoracic aorta.  Images through the upper abdomen are unchanged. There are degenerative changes of the spine and shoulders. There is a moderate compression fracture over the lower thoracic spine unchanged.  IMPRESSION: Small left pleural effusion and moderate size right pleural effusion with associated consolidation in the lower lobes likely compressive atelectasis although cannot exclude infection. Suggestion of aspirate material over the dependent portion of the mid to lower trachea.  Mild to moderate cardiomegaly with small amount of pericardial fluid. Evidence of recent aortic valve replacement. Atherosclerotic coronary artery disease.  Minimal stable mediastinal adenopathy likely reactive.  Stable moderate compression fracture in the region of the lower thoracic spine.   Electronically Signed   By: Elberta Fortis M.D.   On: 01/25/2015 18:00    Dg Abd Portable 1v  02/08/2015   CLINICAL DATA:  Evaluate feeding tube placement.  EXAM: PORTABLE ABDOMEN - 1 VIEW  COMPARISON:  Chest radiograph 02/08/2015  FINDINGS: Feeding tube in the left upper abdomen. Tube is likely in the proximal stomach region. Again noted are bilateral chest tubes. Postsurgical changes in the chest. PICC line extends into the SVC but difficult to evaluate the tip. Persistent opacification at the left lung base.  IMPRESSION: Feeding tube in the proximal stomach region.   Electronically Signed   By: Richarda Overlie M.D.   On: 02/08/2015 16:20   Dg Swallowing Func-speech Pathology  02/17/2015    Objective Swallowing Evaluation:    Patient  Details  Name: Alaisha Eversley MRN: [id number] Date of Birth: [birth year]  Today's Date: 02/17/2015 Time: SLP Start Time  (ACUTE ONLY): 0955-SLP Stop Time (ACUTE ONLY): 1016 SLP Time Calculation (min) (ACUTE ONLY): 21 min  Past Medical History:  Past Medical History  Diagnosis Date  . Arthritis   . Chronic atrial fibrillation     takes Pradaxa daily  . Aortic stenosis     Severe by echo August 2015  . Pneumonia 09/2014  . History of bronchitis 2015  . Joint pain   . Back pain     reason unknown  . Family history of adverse reaction to anesthesia     uncle with MH in the 60's,cousin in the 83's with MH  . Malignant hyperthermia     Patient without known history (no testing, no surgeries prior to  01/17/15), but reported biopsy proven MH in aunts/uncles/first cousins  . 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 None    . Left and right heart catheterization with coronary angiogram N/A  11/01/2014    Procedure: LEFT AND RIGHT HEART CATHETERIZATION WITH CORONARY ANGIOGRAM;   Surgeon: [name], MD;  Location: [facility] CATH LAB;  Service:  Cardiovascular;  Laterality: N/A;  . Aortic valve replacement N/A 01/19/2015    Procedure: AORTIC VALVE REPLACEMENT (AVR);  Surgeon: Purcell Nails,  MD;  Location: [facility] OR;  Service: Open Heart Surgery;  Laterality: N/A;  . Maze N/A 01/19/2015    Procedure: MAZE;  Surgeon: Purcell Nails, MD;  Location: [facility] OR;   Service: Open Heart Surgery;  Laterality: N/A;  . Aortic root enlargement N/A 01/19/2015    Procedure:  AORTIC ROOT ENLARGEMENT;  Surgeon: Purcell Nails, MD;   Location: [facility] OR;  Service: Open Heart Surgery;  Laterality: N/A;  . Tee without cardioversion N/A 01/19/2015    Procedure: TRANSESOPHAGEAL ECHOCARDIOGRAM (TEE);  Surgeon: Purcell Nails, MD;  Location: [facility] OR;  Service: Open Heart Surgery;  Laterality:  N/A;   HPI:  Other Pertinent  Information: Pt is a 74 year old female who underwent AVR  on 5/18. Developed VDRF with hypoxemic respiratory failure likely due to  HCAP, severe atelectasis w/ mucous plugging, and bilateral pleural  effusions complicating chronic respiratory failure due to tracheomalacia,  chronic diastolic CHF, morbid obesity, OSA, COPD and pulmonary  hypertension.Intubated from 5/18-6/6. Bronch on 6/6 showed significant  tracheomalacia and acute inflammation and thick, glue like secretions.MD  concerned for an allergic bronchopulmonary aspergillosis like syndrome.   No Data Recorded  Assessment / Plan / Recommendation CHL IP CLINICAL IMPRESSIONS 02/17/2015  Therapy Diagnosis Moderate pharyngeal phase dysphagia  Clinical Impression Pts swallow function has improved since last MBS,  particularly with generalized strength, aiding pt in compensation for  persistent incomplete airway closure. With thin and nectar thick liquids  the pt penetrates and aspirates during the swallow, though sensation of  aspirate has improved with pt consistently attempting to eject aspirate,  though not always successful. A chin tuck (or head turn) does not  eliminate penetrate with thin and nectar, though a chin tuck is successful  with solids, puree and honey. Recommend pt initiate a Dys 3 (mechanical  soft) diet with honey thick liquids with a chin tuck with every  swallow.  Discussed with TCTS PA who plans to keep Panda in place until pt  demonstrates tolerance.       CHL IP TREATMENT RECOMMENDATION 02/17/2015  Treatment Recommendations Therapy as outlined in treatment plan below     CHL IP DIET RECOMMENDATION 02/17/2015  SLP Diet Recommendations Dysphagia 3 (Mech soft);Honey  Liquid Administration via (None)  Medication Administration Crushed with puree  Compensations Chin tuck;Clear throat intermittently;Small sips/bites  Postural Changes and/or Swallow Maneuvers (None)     CHL IP OTHER RECOMMENDATIONS 02/17/2015  Recommended Consults (None)  Oral  Care Recommendations Oral care BID  Other Recommendations Order thickener from pharmacy     No flowsheet data found.   CHL IP FREQUENCY AND DURATION 02/17/2015  Speech Therapy Frequency (ACUTE ONLY) min 2x/week  Treatment Duration 2 weeks     Pertinent Vitals/Pain NA    SLP Swallow Goals No flowsheet data found.  No flowsheet data found.    CHL IP REASON FOR REFERRAL 02/17/2015  Reason for Referral Objectively evaluate swallowing function     CHL IP ORAL PHASE 02/17/2015  Lips (None)  Tongue (None)  Mucous membranes (None)  Nutritional status (None)  Other (None)  Oxygen therapy (None)  Oral Phase WFL  Oral - Pudding Teaspoon (None)  Oral - Pudding Cup (None)  Oral - Honey Teaspoon (None)  Oral - Honey Cup (None)  Oral - Honey Syringe (None)  Oral - Nectar Teaspoon (None)  Oral - Nectar Cup (None)  Oral - Nectar Straw (None)  Oral - Nectar Syringe (None)  Oral - Ice Chips (None)  Oral - Thin Teaspoon (None)  Oral - Thin Cup (None)  Oral - Thin Straw (None)  Oral - Thin Syringe (None)  Oral - Puree (None)  Oral - Mechanical Soft (None)  Oral - Regular (None)  Oral - Multi-consistency (None)  Oral - Pill (None)  Oral Phase - Comment (None)      CHL IP PHARYNGEAL PHASE 02/17/2015  Pharyngeal Phase Impaired  Pharyngeal - Pudding Teaspoon (None)  Penetration/Aspiration details (pudding teaspoon) (None)  Pharyngeal - Pudding Cup (None)  Penetration/Aspiration details (pudding cup) (None)  Pharyngeal - Honey Teaspoon (None)  Penetration/Aspiration details (honey teaspoon) (None)  Pharyngeal - Honey Cup (None)  Penetration/Aspiration details (honey cup) (None)  Pharyngeal - Honey Syringe (None)  Penetration/Aspiration details (honey syringe) (None)  Pharyngeal - Nectar Teaspoon (None)  Penetration/Aspiration details (nectar teaspoon) (None)  Pharyngeal - Nectar Cup (None)  Penetration/Aspiration details (nectar cup) (None)  Pharyngeal - Nectar Straw (None)  Penetration/Aspiration details (nectar straw) (None)  Pharyngeal -  Nectar Syringe (None)  Penetration/Aspiration details (nectar syringe) (None)  Pharyngeal - Ice Chips (None)  Penetration/Aspiration details (ice chips) (None)  Pharyngeal - Thin Teaspoon (None)  Penetration/Aspiration details (thin teaspoon) (None)  Pharyngeal - Thin Cup (None)  Penetration/Aspiration details (thin cup) (None)  Pharyngeal - Thin Straw (None)  Penetration/Aspiration details (thin straw) (None)  Pharyngeal - Thin Syringe (None)  Penetration/Aspiration details (thin syringe') (None)  Pharyngeal - Puree (None)  Penetration/Aspiration details (puree) (None)  Pharyngeal - Mechanical Soft (None)  Penetration/Aspiration details (mechanical soft) (None)  Pharyngeal - Regular (None)  Penetration/Aspiration details (regular) (None)  Pharyngeal - Multi-consistency (None)  Penetration/Aspiration details (multi-consistency) (None)  Pharyngeal - Pill (None)  Penetration/Aspiration details (pill) (None)  Pharyngeal Comment (None)      CHL IP CERVICAL ESOPHAGEAL PHASE 02/17/2015  Cervical Esophageal Phase WFL  Pudding Teaspoon (None)  Pudding Cup (None)  Honey Teaspoon (None)  Honey Cup (None)  Honey  Straw (None)  Nectar Teaspoon (None)  Nectar Cup (None)  Nectar Straw (None)  Nectar Sippy Cup (None)  Thin Teaspoon (None)  Thin Cup (None)  Thin Straw (None)  Thin Sippy Cup (None)  Cervical Esophageal Comment (None)    No flowsheet data found.        Harlon Ditty, [place] CCC-SLP [phone]  Claudine Mouton 02/17/2015, 10:53 AM     Discharge Medications:    Medication List    STOP taking these medications        ALPHA LIPOIC ACID PO     ARTHRITIS STRENGTH BC POWDER PO     dabigatran 150 MG Caps capsule  Commonly known as:  PRADAXA     DHEA PO     FISH OIL PO     GIN-ZING PO     GINKGO BILOBA PO     GREEN TEA PO     MAGNESIUM PO     MSM PO     OVER THE COUNTER MEDICATION     Potassium 99 MG Tabs     VITAMIN A PO     VITAMIN E PO      TAKE these medications        albuterol  (2.5 MG/3ML) 0.083% nebulizer solution  Commonly known as:  PROVENTIL  Take 3 mLs (2.5 mg total) by nebulization every 4 (four) hours as needed for wheezing or shortness of breath.     amiodarone 200 MG tablet  Commonly known as:  PACERONE  Take 1 tablet (200 mg total) by mouth daily.     amLODipine 10 MG tablet  Commonly known as:  NORVASC  Take 1 tablet (10 mg total) by mouth daily.     B COMPLEX PO  Take 1 tablet by mouth 2 (two) times daily.     budesonide 0.25 MG/2ML nebulizer solution  Commonly known as:  PULMICORT  Take 2 mLs (0.25 mg total) by nebulization every 6 (six) hours.     chlorhexidine 0.12 % solution  Commonly known as:  PERIDEX  15 mLs by Mouth Rinse route 2 (two) times daily.     FOLIC ACID PO  Take 1 tablet by mouth 2 (two) times daily.     food thickener Powd  Commonly known as:  THICK IT  As needed     furosemide 20 MG tablet  Commonly known as:  LASIX  Take 2 tablets (40 mg total) by mouth 2 (two) times daily.     potassium chloride SA 20 MEQ tablet  Commonly known as:  K-DUR,KLOR-CON  Take 1 tablet (20 mEq total) by mouth 2 (two) times daily.     predniSONE 10 MG tablet  Commonly known as:  DELTASONE  Take 1 tablet (10 mg total) by mouth daily with breakfast.     traMADol 50 MG tablet  Commonly known as:  ULTRAM  Take 1 tablet (50 mg total) by mouth every 6 (six) hours as needed (pain).     VITAMIN C PO  Take 1 tablet by mouth 2 (two) times daily.     warfarin 4 MG tablet  Commonly known as:  COUMADIN  Take 1 tablet (4 mg total) by mouth daily at 6 PM. As directed, bases on INR        The patient has been discharged on:   1.Beta Blocker:  Yes [   ]  No   [  x ]                              If No, reason:  2.Ace Inhibitor/ARB: Yes [   ]                                     No  [   x ]                                     If No, reason:  3.Statin:   Yes [   ]                  No  [ x  ]                   If No, reason:  4.Ecasa:  Yes  [   ]                  No   [  x ]                  If No, reason:  Follow Up Appointments: Follow-up Information    Follow up with Max Fickle, MD On 03/11/2015.   Specialty:  Pulmonary Disease   Why:  9:30am    Contact informationDanella Deis [place] [address] [phone]       Follow up with Joni Reining, NP On 03/04/2015.   Specialties:  Nurse Practitioner, Radiology, Cardiology   Why:  Appointment time is at 2:10 pm   Contact information:   [address] [place] [address] [phone]       Follow up with Purcell Nails, MD On 03/22/2015.   Specialty:  Cardiothoracic Surgery   Why:  PA/LAT CXR to be taken (at [facility] which is in the same building as Dr. Orvan July office) on at 03/22/2015 at 2:45 pm ;Appointment time is at 3:30 pm   Contact information:   [address] [employer] [address] [place] [address] [phone]       Follow up with SNF.   Why:  Please draw the PT and INR (as is on Coumadin) on Wednesday 02/23/2015 and call or fax results to Dr. York Spaniel office    1. Please obtain vital signs at least one time daily 2.Please weigh the patient daily. If he or she continues to gain weight or develops lower extremity edema, contact the office at [phone]. 3. Ambulate patient at least three times daily and please use sternal precautions. 4 check PT/INR to adjust coumadin dosing for INR >2, <3 Signed: ZIMMERMAN,DONIELLE MPA-C 02/20/2015, 11:07 AM

## 2015-02-18 NOTE — Progress Notes (Signed)
CSW spoke with pt concerning SNF placement after she leaves the hospital- pt is now agreeable to SNF and chooses [facility].  CSW informed the facility of pt choice- they are still able to take the pt and anticipate a DC early next week.  Merlyn Lot, LCSWA Clinical Social Worker [phone]

## 2015-02-18 NOTE — Progress Notes (Signed)
Orthopedic Tech Progress Note Patient Details:  Sandra Turner 1940/10/27 [id number]  Ortho Devices Type of Ortho Device: Roland Rack boot Ortho Device/Splint Location: bilateral Ortho Device/Splint Interventions: Application   Nikki Dom 02/18/2015, 9:27 AM

## 2015-02-18 NOTE — Progress Notes (Signed)
02/18/2015 0930 NGT in R nares D/C'd per MD order.  Pt tolerated well. Kathryne Hitch

## 2015-02-18 NOTE — Progress Notes (Signed)
      [address]       [place] [address]             [phone]        30 Days Post-Op Procedure(s) (LRB): AORTIC VALVE REPLACEMENT (AVR) (N/A) MAZE (N/A)  AORTIC ROOT ENLARGEMENT (N/A) TRANSESOPHAGEAL ECHOCARDIOGRAM (TEE) (N/A)  Subjective: Patient states excited for food.  Objective: Vital signs in last 24 hours: Temp:  [97.3 F (36.3 C)-98.4 F (36.9 C)] 97.3 F (36.3 C) (06/17 0555) Pulse Rate:  [63-78] 66 (06/17 0555) Cardiac Rhythm:  [-] Atrial fibrillation (06/16 2100) Resp:  [17-18] 18 (06/17 0555) BP: (125-132)/(41-46) 132/46 mmHg (06/17 0555) SpO2:  [94 %-98 %] 94 % (06/17 0555) Weight:  [234 lb 1.6 oz (106.187 kg)] 234 lb 1.6 oz (106.187 kg) (06/17 0555)  Pre op weight 112 kg Current Weight  02/18/15 234 lb 1.6 oz (106.187 kg)      Intake/Output from previous day: 06/16 0701 - 06/17 0700 In: 120 [P.O.:120] Out: 2102 [Urine:2100; Stool:2]   Physical Exam:  Cardiovascular: [facility] Pulmonary: Diminished at bases; no rales, wheezes, or rhonchi. Abdomen: Soft, non tender, bowel sounds present. Extremities: Bilateral +++ lower extremity edema. Wounds: Clean and dry.  No erythema or signs of infection.  Lab Results: CBC: Recent Labs  02/17/15 0610  WBC 10.3  HGB 9.4*  HCT 29.5*  PLT 113*   BMET:  Recent Labs  02/17/15 0610  NA 140  K 3.7  CL 96*  CO2 35*  GLUCOSE 109*  BUN 51*  CREATININE 0.90  CALCIUM 9.0    PT/INR:  Lab Results  Component Value Date   INR 1.33 02/18/2015   INR 1.36 02/17/2015   INR 1.33 02/16/2015   ABG:  INR: Will add last result for INR, ABG once components are confirmed Will add last 4 CBG results once components are confirmed  Assessment/Plan:  1. CV - HR in the 60's. On Amiodarone 200 mg daily, Norvasc 10 mg daily, and Coumadin. INR remaining around 1.3. 2.  Pulmonary - On 1 liter of oxygen via [place]. 3. Volume Overload - On Lasix 80 mg IV bid. Will place Unna boots. 4.  Acute  blood loss anemia - H and H yesterday 9.4 and 29.5 5. Mild thrombocytopenia-platelets 113,000 6.GI-vocal cord paralysis. She passed swallow test yesterday. On Dysphagia III diet. Panda to be removed. Appreciate speech pathology's assistance. 7. To [facility] likely Monday   ZIMMERMAN,DONIELLE MPA-C 02/18/2015,8:10 AM

## 2015-02-18 NOTE — Progress Notes (Addendum)
Pt ambulated 67ft with walker on 1L N/C. DOE noted and pt stopped one time for rest. Pt returned to chair in room with call bell within reach. Pt oxygen sat was 94% on RA when returned to room. RN will cont to monitor.

## 2015-02-18 NOTE — Progress Notes (Addendum)
CARDIAC REHAB PHASE I   Second attempt to ambulate with pt today. On first attempt pt stated she walked this morning (pt ambulated from 2W23-2W20 when transferring to new room per nursing staff) and asked [place] to return after lunch. Pt sitting up in chair, states "I can't walk right now." Pt states she is "having trouble breathing." Pt states "I got 40 mg of lasix and have been up and down to the potty all day."  Pt pointed to her legs which are visibly swollen, elevated and wrapped. Encouraged pt to ambulate with nursing staff later today if able. RN aware of pt refusal to ambulate. Pt in chair, call bell within reach. Will follow up tomorrow.   Joylene Grapes, RN, BSN 02/18/2015 2:00 PM

## 2015-02-18 NOTE — Progress Notes (Signed)
Physical Therapy Treatment Patient Details Name: Sandra Turner MRN: [id number] DOB: 10-Dec-1940 Today's Date: 02/18/2015    History of Present Illness Sandra Turner is a 74 year old female adm with dyspnea, severe aortic stenosis, and atrial fibrillation. 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 Intubated 01/31/15 with likely due to HCAP, severe atelectasis w/ mucous plugging, required multiple bronchoscopy with lavage due to mucous plugging, bil chest tubes placed, extubated 6/6    PT Comments    Pt currently limited by increased respiratory rate (despite calming and breathing techniques). Required frequent rest breaks during AROM exercises (no resistance). SaO2 96-97% on 1 L. She reports she has felt worse this morning and has received 40 mg Lasix (which has had her up and back to [facility] numerous times). States she will walk later with nursing if she can. RN made aware.    Follow Up Recommendations  SNF;Supervision/Assistance - 24 hour (If pt refuses SNF would recommend HHPT and aide)     Equipment Recommendations  Rolling walker with 5" wheels;3in1 (PT)    Recommendations for Other Services       Precautions / Restrictions Precautions Precautions: Fall;Sternal Precaution Comments: Reviewed sternal precautions; continues to require vc to adhere (pushing up from chair) Restrictions Weight Bearing Restrictions: Yes Other Position/Activity Restrictions: Sternal precautions    Mobility  Bed Mobility               General bed mobility comments: Patient in chair as PT entered room  Transfers Overall transfer level: Needs assistance Equipment used: None Transfers: Sit to/from Stand Sit to Stand: Min guard         General transfer comment: pt pushed on armrest with RUE despite cues "I just use one arm" Denied pain in sternum or any sense of sternal instability; encouraged adherence to sternal precautions  Ambulation/Gait             General Gait  Details: Resp rate and recent lasix precluded ambulation   Stairs            Wheelchair Mobility    Modified Rankin (Stroke Patients Only)       Balance           Standing balance support: No upper extremity supported Standing balance-Leahy Scale: Fair Standing balance comment: stood x 45 seconds (limited by fatigue and RR)                    Cognition Arousal/Alertness: Awake/alert Behavior During Therapy: Anxious (related to her breathing) Overall Cognitive Status: Within Functional Limits for tasks assessed                      Exercises General Exercises - Lower Extremity Ankle Circles/Pumps: AROM;Both;10 reps;Seated Long Arc Quad: Both;10 reps;Seated;AROM Hip ABduction/ADduction:  (reclined in chair) Hip Flexion/Marching: Both;10 reps;Seated;AROM Toe Raises: AROM;Both;10 reps;Seated Heel Raises: AROM;Both;10 reps;Seated Other Exercises Other Exercises: hand pumps x 10 bil    General Comments        Pertinent Vitals/Pain Pain Assessment: No/denies pain    Home Living                      Prior Function            PT Goals (current goals can now be found in the care plan section) Acute Rehab PT Goals Patient Stated Goal: to regain her strength PT Goal Formulation: With patient Time For Goal Achievement: 03/02/15 Potential to Achieve Goals: Fair Progress towards  PT goals: Not progressing toward goals - comment;Goals downgraded-see care plan    Frequency  Min 3X/week    PT Plan Current plan remains appropriate    Co-evaluation             End of Session Equipment Utilized During Treatment: Oxygen Activity Tolerance: Treatment limited secondary to medical complications (Comment) (incr RR, recent lasix with frequent trips to [facility]) Patient left: in chair;with call bell/phone within reach     Time: 6979-4801 PT Time Calculation (min) (ACUTE ONLY): 17 min  Charges:  $Therapeutic Exercise: 8-22 mins                     G Codes:      Sandra Turner 19-Feb-2015, 1:18 PM Pager [phone]

## 2015-02-18 NOTE — Progress Notes (Signed)
Speech Language Pathology Treatment: Dysphagia  Patient Details Name: Sandra Turner MRN: [id number] DOB: November 14, 1940 Today's Date: 02/18/2015 Time: 5726-2035 SLP Time Calculation (min) (ACUTE ONLY): 20 min  Assessment / Plan / Recommendation Clinical Impression  Pt presents with improved vocal quality and forceful cough today. SLP guided pt in exercises x2, pt states she has been completing these independently 3x a day. Still struggles to sustain phonation due to poor breath support. Pt is tolerating honey thick liquids well, min question cue provided for chin tuck. Pt reports she is consistent with this, but questionable. Reiterated importance of chin tuck for safety. Pt consumed 100% of POs on breakfast tray. SLP will continue to follow for upgrade. Pt should f/u with SLP at next level of care if d/c may happen soon.    HPI Other Pertinent Information: Pt is a 74 year old female who underwent AVR on 5/18. 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    Pertinent Vitals Pain Assessment: No/denies pain  SLP Plan  Continue with current plan of care    Recommendations Diet recommendations: Dysphagia 3 (mechanical soft);Honey-thick liquid Liquids provided via: Cup Medication Administration: Whole meds with liquid Supervision: Patient able to self feed Compensations: Chin tuck;Clear throat intermittently;Small sips/bites              Oral Care Recommendations: Oral care BID Plan: Continue with current plan of care    GO    [facility], MA CCC-SLP  [phone]  Claudine Mouton 02/18/2015, 8:47 AM

## 2015-02-19 LAB — BASIC METABOLIC PANEL
Anion gap: 7 (ref 5–15)
BUN: 45 mg/dL — AB (ref 6–20)
CALCIUM: 8.7 mg/dL — AB (ref 8.9–10.3)
CO2: 35 mmol/L — ABNORMAL HIGH (ref 22–32)
Chloride: 97 mmol/L — ABNORMAL LOW (ref 101–111)
Creatinine, Ser: 1.21 mg/dL — ABNORMAL HIGH (ref 0.44–1.00)
GFR calc Af Amer: 51 mL/min — ABNORMAL LOW (ref >60)
GFR, EST NON AFRICAN AMERICAN: 44 mL/min — AB (ref >60)
GLUCOSE: 97 mg/dL (ref 65–99)
POTASSIUM: 3.8 mmol/L (ref 3.5–5.1)
Sodium: 139 mmol/L (ref 135–145)

## 2015-02-19 LAB — PROTIME-INR
INR: 1.37 (ref 0.00–1.49)
Prothrombin Time: 17 seconds — ABNORMAL HIGH (ref 11.6–15.2)

## 2015-02-19 LAB — GLUCOSE, CAPILLARY
Glucose-Capillary: 99 mg/dL (ref 65–99)
Glucose-Capillary: 132 mg/dL — ABNORMAL HIGH (ref 65–99)

## 2015-02-19 MED ORDER — WARFARIN SODIUM 4 MG PO TABS
4.0000 mg | ORAL_TABLET | Freq: Every day | ORAL | Status: DC
  Administered 2015-02-19 – 2015-02-20 (×2): 4 mg via ORAL
  Filled 2015-02-19 (×3): qty 1

## 2015-02-19 MED ORDER — POTASSIUM CHLORIDE CRYS ER 20 MEQ PO TBCR
20.0000 meq | EXTENDED_RELEASE_TABLET | Freq: Once | ORAL | Status: AC
  Administered 2015-02-19: 20 meq via ORAL
  Filled 2015-02-19 (×2): qty 1

## 2015-02-19 NOTE — Progress Notes (Signed)
CARDIAC REHAB PHASE I   PRE:  Rate/Rhythm: 72 afib  BP:  Supine:   Sitting: 124/60  Standing:    SaO2: 97% RA  MODE:  Ambulation: 24 ft   POST:  Rate/Rhythem: 87 afib  BP:  Supine:   Sitting: 134/70  Standing:    SaO2: 91% RA  Pt ambulated 24 ft with assist x1 using rolling walker, took 2 standing rest breaks for fatigue.  She tires very easily and still c/o SOB.  Tried to encourage upright posture to improve breathing.  Pt returned to chair with call bell in reach.  Pt encouraged to walk with staff over weekend.  She will continue to need encouragement to walk.  We will f/u with pt on Monday. Fabio Pierce, MA, ACSM RCEP [phone]  Hazle Nordmann

## 2015-02-19 NOTE — Progress Notes (Addendum)
      [address]       [place] [address]             [phone]        31 Days Post-Op Procedure(s) (LRB): AORTIC VALVE REPLACEMENT (AVR) (N/A) MAZE (N/A)  AORTIC ROOT ENLARGEMENT (N/A) TRANSESOPHAGEAL ECHOCARDIOGRAM (TEE) (N/A)  Subjective: Patient eating breakfast. Voice is slowly getting "stronger"  Objective: Vital signs in last 24 hours: Temp:  [98 F (36.7 C)-98.8 F (37.1 C)] 98 F (36.7 C) (06/18 0618) Pulse Rate:  [64-72] 67 (06/18 0618) Cardiac Rhythm:  [-] Atrial fibrillation (06/17 1945) Resp:  [16-18] 18 (06/18 0618) BP: (127-155)/(43-122) 152/48 mmHg (06/18 0618) SpO2:  [94 %-99 %] 98 % (06/18 0618) Weight:  [228 lb (103.42 kg)] 228 lb (103.42 kg) (06/18 0618)  Pre op weight 112 kg Current Weight  02/19/15 228 lb (103.42 kg)      Intake/Output from previous day: 06/17 0701 - 06/18 0700 In: 816 [P.O.:816] Out: 1350 [Urine:1350]   Physical Exam:  Cardiovascular: IRRR IRRR Pulmonary: Slightly diminished at bases; no rales, wheezes, or rhonchi. Abdomen: Soft, non tender, bowel sounds present. Extremities: Bilateral +++ lower extremity edema. Unna boots in place Wounds: Clean and dry.  No erythema or signs of infection.  Lab Results: CBC:  Recent Labs  02/17/15 0610  WBC 10.3  HGB 9.4*  HCT 29.5*  PLT 113*   BMET:   Recent Labs  02/17/15 0610 02/19/15 0442  NA 140 139  K 3.7 3.8  CL 96* 97*  CO2 35* 35*  GLUCOSE 109* 97  BUN 51* 45*  CREATININE 0.90 1.21*  CALCIUM 9.0 8.7*    PT/INR:  Lab Results  Component Value Date   INR 1.37 02/19/2015   INR 1.33 02/18/2015   INR 1.36 02/17/2015   ABG:  INR: Will add last result for INR, ABG once components are confirmed Will add last 4 CBG results once components are confirmed  Assessment/Plan:  1. CV - Appears to have had a run of VT last evening. HR in the 60's. On Amiodarone 200 mg daily, Norvasc 10 mg daily, and Coumadin. INR remaining around 1.37. Will  increase Coumadin dose tonight as INR not increasing. 2.  Pulmonary - On 1 liter of oxygen via [place]. 3. Volume Overload - On Lasix 80 mg IV bid. Unna boots placed yesterday. Had good diuresis but creatinine starting to increase (0.9 to 1.21). Will continue with current Lasix dose, check BMET in am, and likely decrease Lasix in am. 4.  Acute blood loss anemia - H and H yesterday 9.4 and 29.5 5. Mild thrombocytopenia-platelets 113,000 6.GI-vocal cord paralysis. On Dysphagia III (mechanical soft, honey thick liquid) diet.  Appreciate speech pathology's assistance. 7. Supplement potassium 8. Was previously NPO and receiving TFs. Now on a diet. She is not diabetic. Pre op HGA1C 5.3. No hypoglycemia. Will stop accu checks and SS PRN. 9. Hopefully, to [facility] Monday   ZIMMERMAN,DONIELLE MPA-C 02/19/2015,7:30 AM  Walked 25 feet Cont lasix for pedal edema SNF Mon

## 2015-02-20 LAB — BASIC METABOLIC PANEL
Anion gap: 8 (ref 5–15)
BUN: 39 mg/dL — ABNORMAL HIGH (ref 6–20)
CHLORIDE: 98 mmol/L — AB (ref 101–111)
CO2: 31 mmol/L (ref 22–32)
Calcium: 8.7 mg/dL — ABNORMAL LOW (ref 8.9–10.3)
Creatinine, Ser: 1.29 mg/dL — ABNORMAL HIGH (ref 0.44–1.00)
GFR calc Af Amer: 47 mL/min — ABNORMAL LOW (ref >60)
GFR calc non Af Amer: 40 mL/min — ABNORMAL LOW (ref >60)
GLUCOSE: 146 mg/dL — AB (ref 65–99)
POTASSIUM: 4.2 mmol/L (ref 3.5–5.1)
Sodium: 137 mmol/L (ref 135–145)

## 2015-02-20 LAB — PROTIME-INR
INR: 1.44 (ref 0.00–1.49)
Prothrombin Time: 17.6 seconds — ABNORMAL HIGH (ref 11.6–15.2)

## 2015-02-20 LAB — GLUCOSE, CAPILLARY
GLUCOSE-CAPILLARY: 59 mg/dL — AB (ref 65–99)
Glucose-Capillary: 101 mg/dL — ABNORMAL HIGH (ref 65–99)

## 2015-02-20 MED ORDER — POTASSIUM CHLORIDE 20 MEQ/15ML (10%) PO SOLN
20.0000 meq | Freq: Two times a day (BID) | ORAL | Status: DC
  Administered 2015-02-20 (×2): 20 meq via ORAL
  Filled 2015-02-20 (×4): qty 15

## 2015-02-20 MED ORDER — FUROSEMIDE 10 MG/ML IJ SOLN
40.0000 mg | Freq: Two times a day (BID) | INTRAMUSCULAR | Status: DC
  Administered 2015-02-20 – 2015-02-21 (×2): 40 mg via INTRAVENOUS
  Filled 2015-02-20 (×3): qty 4

## 2015-02-20 NOTE — Progress Notes (Signed)
CRITICAL VALUE ALERT  Critical value received:  CBG 59, Rechecked after giving 4 oz orange juice; CBG 101  Date of notification:  02/20/15  Time of notification: 06:42 am   Critical value read back: Yes  Nurse who received alert:  Sherilyn Dacosta  MD notified (1st page):    Time of first page:    MD notified (2nd page):  Time of second page:  Responding MD:  Time MD responded:

## 2015-02-20 NOTE — Progress Notes (Addendum)
      [address]       [place] [address]             [phone]        32 Days Post-Op Procedure(s) (LRB): AORTIC VALVE REPLACEMENT (AVR) (N/A) MAZE (N/A)  AORTIC ROOT ENLARGEMENT (N/A) TRANSESOPHAGEAL ECHOCARDIOGRAM (TEE) (N/A)  Subjective: Patient just finished breakfast.  Objective: Vital signs in last 24 hours: Temp:  [98.2 F (36.8 C)-98.8 F (37.1 C)] 98.2 F (36.8 C) (06/19 0433) Pulse Rate:  [65-74] 65 (06/19 0433) Cardiac Rhythm:  [-] Atrial fibrillation (06/19 0722) Resp:  [18] 18 (06/19 0433) BP: (128-138)/(46-59) 138/47 mmHg (06/19 0433) SpO2:  [96 %-97 %] 97 % (06/19 0433) Weight:  [225 lb 11.2 oz (102.377 kg)] 225 lb 11.2 oz (102.377 kg) (06/19 0433)  Pre op weight 112 kg Current Weight  02/20/15 225 lb 11.2 oz (102.377 kg)      Intake/Output from previous day: 06/18 0701 - 06/19 0700 In: 960 [P.O.:960] Out: 2403 [Urine:2400; Stool:3]   Physical Exam:  Cardiovascular: [facility] Pulmonary: Slightly diminished at bases; no rales, wheezes, or rhonchi. Abdomen: Soft, non tender, bowel sounds present. Extremities: Bilateral +++ lower extremity edema. Unna boots in place Wounds: Clean and dry.  No erythema or signs of infection.  Lab Results: CBC: No results for input(s): WBC, HGB, HCT, PLT in the last 72 hours. BMET:   Recent Labs  02/19/15 0442  NA 139  K 3.8  CL 97*  CO2 35*  GLUCOSE 97  BUN 45*  CREATININE 1.21*  CALCIUM 8.7*    PT/INR:  Lab Results  Component Value Date   INR 1.44 02/20/2015   INR 1.37 02/19/2015   INR 1.33 02/18/2015   ABG:  INR: Will add last result for INR, ABG once components are confirmed Will add last 4 CBG results once components are confirmed  Assessment/Plan:  1. CV -HR remains in the 60's. On Amiodarone 200 mg daily, Norvasc 10 mg daily, and Coumadin. INR slightly increased to 1.44  2.  Pulmonary - On room air. Encourage incentive spirometer. 3. Volume Overload - On Lasix  80 mg IV bid. Unna boots in place. Has had good diuresis. Await BMET but likely will decrease Lasix to 40 mg IV bid 4.  Acute blood loss anemia - H and H yesterday 9.4 and 29.5 5. Mild thrombocytopenia-platelets 113,000 6.GI-vocal cord paralysis. On Dysphagia III (mechanical soft, honey thick liquid) diet.  Appreciate speech pathology's assistance. 8. Hopefully, to [facility] Monday   ZIMMERMAN,DONIELLE MPA-C 02/20/2015,7:56 AM  Ready for SNF Lasix dose reduced- chang to po dosing for transfer patient examined and medical record reviewed,agree with above note. Kathlee Nations Trigt III 02/20/2015

## 2015-02-21 LAB — PROTIME-INR
INR: 1.47 (ref 0.00–1.49)
PROTHROMBIN TIME: 17.9 s — AB (ref 11.6–15.2)

## 2015-02-21 LAB — GLUCOSE, CAPILLARY: Glucose-Capillary: 113 mg/dL — ABNORMAL HIGH (ref 65–99)

## 2015-02-21 MED ORDER — CHLORHEXIDINE GLUCONATE 0.12 % MT SOLN: 15.0000 mL | Freq: Two times a day (BID) | OROMUCOSAL | Status: DC

## 2015-02-21 MED ORDER — WARFARIN SODIUM 4 MG PO TABS: 4.0000 mg | ORAL_TABLET | Freq: Every day | ORAL | Status: DC

## 2015-02-21 MED ORDER — POTASSIUM CHLORIDE CRYS ER 20 MEQ PO TBCR: 20.0000 meq | EXTENDED_RELEASE_TABLET | Freq: Two times a day (BID) | ORAL | Status: DC

## 2015-02-21 MED ORDER — POTASSIUM CHLORIDE CRYS ER 20 MEQ PO TBCR
20.0000 meq | EXTENDED_RELEASE_TABLET | Freq: Two times a day (BID) | ORAL | Status: DC
  Administered 2015-02-21: 20 meq via ORAL
  Filled 2015-02-21: qty 1

## 2015-02-21 MED ORDER — AMLODIPINE BESYLATE 10 MG PO TABS: 10.0000 mg | ORAL_TABLET | Freq: Every day | ORAL | Status: DC

## 2015-02-21 MED ORDER — TRAMADOL HCL 50 MG PO TABS: 50.0000 mg | ORAL_TABLET | Freq: Four times a day (QID) | ORAL | Status: AC | PRN

## 2015-02-21 MED ORDER — FUROSEMIDE 20 MG PO TABS: 40.0000 mg | ORAL_TABLET | Freq: Two times a day (BID) | ORAL | Status: DC

## 2015-02-21 MED ORDER — PREDNISONE 10 MG PO TABS: 10.0000 mg | ORAL_TABLET | Freq: Every day | ORAL | Status: DC

## 2015-02-21 MED ORDER — STARCH (THICKENING) PO POWD: ORAL | Status: DC

## 2015-02-21 MED ORDER — AMIODARONE HCL 200 MG PO TABS: 200.0000 mg | ORAL_TABLET | Freq: Every day | ORAL | Status: DC

## 2015-02-21 MED ORDER — BUDESONIDE 0.25 MG/2ML IN SUSP: 0.2500 mg | Freq: Four times a day (QID) | RESPIRATORY_TRACT | Status: DC

## 2015-02-21 NOTE — Progress Notes (Addendum)
02/21/2015 10:51 AM Nursing note PICC line removed per orders by IV therapy. Family notified of pt. D/c today to [facility] per pt. Request. Report called to [facility]. Questions and concerns addressed.  Graceyn Fodor, Blanchard Kelch

## 2015-02-21 NOTE — Progress Notes (Signed)
Patient will discharge to [facility] Anticipated discharge date:02/21/15 Family notified: pt to notify Transportation by PTAR- scheduled for 11am  CSW signing off.  Merlyn Lot, LCSWA Clinical Social Worker [phone]

## 2015-02-21 NOTE — Progress Notes (Signed)
02/21/2015 11:41 AM Nursing note Pt. D/c to [facility] center via ambulance transport.  Hezekiah Veltre, Blanchard Kelch

## 2015-02-21 NOTE — Progress Notes (Signed)
Utilization review completed.  

## 2015-02-21 NOTE — Progress Notes (Signed)
Discussed ed with pt, voiced understanding. Encouraged her to walk/work with therapy x3 a day (in hospital only walking x1 a day, about 25 ft) Pt sts she plans to go back to work still. Pt not interested in CRPII as she sts it will be too much travel/appointments/etc to do x3 a week. Encouraged her to notify her cardiologist if she changes her mind.  [phone] Ethelda Chick CES, ACSM 10:42 AM 02/21/2015

## 2015-02-21 NOTE — Progress Notes (Signed)
      [address]       [place] [address]             [phone]      33 Days Post-Op Procedure(s) (LRB): AORTIC VALVE REPLACEMENT (AVR) (N/A) MAZE (N/A)  AORTIC ROOT ENLARGEMENT (N/A) TRANSESOPHAGEAL ECHOCARDIOGRAM (TEE) (N/A) Subjective: Feels pretty good, some cough/congestion  Objective: Vital signs in last 24 hours: Temp:  [97.8 F (36.6 C)-98.4 F (36.9 C)] 97.8 F (36.6 C) (06/20 0438) Pulse Rate:  [65-74] 67 (06/20 0438) Cardiac Rhythm:  [-] Atrial fibrillation (06/19 1945) Resp:  [18] 18 (06/20 0438) BP: (124-147)/(36-45) 124/38 mmHg (06/20 0438) SpO2:  [97 %-98 %] 98 % (06/20 0438) Weight:  [223 lb 11.2 oz (101.47 kg)] 223 lb 11.2 oz (101.47 kg) (06/20 0438)  Hemodynamic parameters for last 24 hours:    Intake/Output from previous day: 06/19 0701 - 06/20 0700 In: 930 [P.O.:840; I.V.:90] Out: 2650 [Urine:2650] Intake/Output this shift:    General appearance: alert, cooperative and no distress Heart: irregularly irregular rhythm Lungs: mildly dim in bases, no wheeze Abdomen: soft, non-tender Extremities: edema conts to improve Wound: incis healing well  Lab Results: No results for input(s): WBC, HGB, HCT, PLT in the last 72 hours. BMET:  Recent Labs  02/19/15 0442 02/20/15 0927  NA 139 137  K 3.8 4.2  CL 97* 98*  CO2 35* 31  GLUCOSE 97 146*  BUN 45* 39*  CREATININE 1.21* 1.29*  CALCIUM 8.7* 8.7*    PT/INR:  Recent Labs  02/21/15 0510  LABPROT 17.9*  INR 1.47   ABG    Component Value Date/Time   PHART 7.507* 02/05/2015 0442   HCO3 39.8* 02/05/2015 0442   TCO2 41 02/05/2015 0442   ACIDBASEDEF TEST WILL BE CREDITED 01/20/2015 0930   O2SAT 97.0 02/05/2015 0442   CBG (last 3)   Recent Labs  02/20/15 0637 02/20/15 0713 02/21/15 0648  GLUCAP 59* 101* 113*    Meds Scheduled Meds: . albuterol  2.5 mg Nebulization Q6H  . amiodarone  200 mg Oral Daily  . amLODipine  10 mg Oral Daily  . antiseptic oral rinse   7 mL Mouth Rinse q12n4p  . budesonide (PULMICORT) nebulizer solution  0.25 mg Nebulization Q6H WA  . chlorhexidine  15 mL Mouth Rinse BID  . furosemide  40 mg Intravenous BID  . lidocaine  15 mL Mouth/Throat Once  . potassium chloride  20 mEq Oral BID  . predniSONE  40 mg Oral Q breakfast  . sodium chloride  10-40 mL Intracatheter Q12H  . warfarin  4 mg Oral q1800  . Warfarin - Physician Dosing Inpatient   Does not apply q1800   Continuous Infusions: . sodium chloride Stopped (01/30/15 1900)  . sodium chloride 10 mL/hr at 02/15/15 0016   PRN Meds:.acetaminophen (TYLENOL) oral liquid 160 mg/5 mL, albuterol, food thickener, metoprolol, ondansetron (ZOFRAN) IV, sodium chloride, traMADol  Xrays No results found.  Assessment/Plan: S/P Procedure(s) (LRB): AORTIC VALVE REPLACEMENT (AVR) (N/A) MAZE (N/A)  AORTIC ROOT ENLARGEMENT (N/A) TRANSESOPHAGEAL ECHOCARDIOGRAM (TEE) (N/A) conts to be quite stable and improving- to SNF today   LOS: 33 days    Jatniel Verastegui E 02/21/2015

## 2015-02-21 NOTE — Clinical Social Work Placement (Signed)
   CLINICAL SOCIAL WORK PLACEMENT  NOTE  Date:  02/21/2015  Patient Details  Name: Sandra Turner MRN: [id number] Date of Birth: 1941/07/30  Clinical Social Work is seeking post-discharge placement for this patient at the Skilled  Nursing Facility level of care (*CSW will initial, date and re-position this form in  chart as items are completed):  Yes   Patient/family provided with [facility] Department's list of facilities offering this level of care within the geographic area requested by the patient (or if unable, by the patient's family).  Yes   Patient/family informed of their freedom to choose among providers that offer the needed level of care, that participate in Medicare, Medicaid or managed care program needed by the patient, have an available bed and are willing to accept the patient.  Yes   Patient/family informed of [facility]'s ownership interest in [facility] and [facility], as well as of the fact that they are under no obligation to receive care at these facilities.  PASRR submitted to EDS on 01/24/15     PASRR number received on 01/24/15     Existing PASRR number confirmed on       FL2 transmitted to all facilities in geographic area requested by pt/family on 01/24/15     FL2 transmitted to all facilities within larger geographic area on       Patient informed that his/her managed care company has contracts with or will negotiate with certain facilities, including the following:        Yes   Patient/family informed of bed offers received.  Patient [facility] bed at [facility]     Physician recommends and patient chooses bed at      Patient to be transferred to [facility] on 02/21/15.  Patient to be transferred to facility by [facility]tar     Patient family notified on 02/21/15 of transfer.  Name of family member notified:  pt to notify     PHYSICIAN Please sign FL2     Additional Comment:     _______________________________________________ Izora Ribas, LCSW 02/21/2015, 9:52 AM

## 2015-03-03 ENCOUNTER — Ambulatory Visit (INDEPENDENT_AMBULATORY_CARE_PROVIDER_SITE_OTHER): Payer: Medicare Other | Admitting: Adult Health

## 2015-03-03 ENCOUNTER — Other Ambulatory Visit (HOSPITAL_COMMUNITY)
Admission: RE | Admit: 2015-03-03 | Discharge: 2015-03-03 | Disposition: A | Discharge status: Home / Self Care | Payer: Medicare Other | Source: Ambulatory Visit | Attending: Adult Health | Admitting: Adult Health

## 2015-03-03 ENCOUNTER — Ambulatory Visit (HOSPITAL_COMMUNITY)
Admission: RE | Admit: 2015-03-03 | Discharge: 2015-03-03 | Disposition: A | Discharge status: Home / Self Care | Payer: Medicare Other | Source: Ambulatory Visit | Attending: Adult Health | Admitting: Adult Health

## 2015-03-03 ENCOUNTER — Encounter: Payer: Self-pay | Admitting: Adult Health

## 2015-03-03 VITALS — BP 120/58 | HR 96 | Ht 62.0 in | Wt 233.0 lb

## 2015-03-03 DIAGNOSIS — I4891 Unspecified atrial fibrillation: Secondary | ICD-10-CM | POA: Insufficient documentation

## 2015-03-03 DIAGNOSIS — Z7901 Long term (current) use of anticoagulants: Secondary | ICD-10-CM | POA: Insufficient documentation

## 2015-03-03 DIAGNOSIS — Z954 Presence of other heart-valve replacement: Secondary | ICD-10-CM

## 2015-03-03 DIAGNOSIS — J189 Pneumonia, unspecified organism: Secondary | ICD-10-CM | POA: Diagnosis not present

## 2015-03-03 DIAGNOSIS — R0602 Shortness of breath: Secondary | ICD-10-CM

## 2015-03-03 DIAGNOSIS — J90 Pleural effusion, not elsewhere classified: Secondary | ICD-10-CM | POA: Diagnosis not present

## 2015-03-03 DIAGNOSIS — Z952 Presence of prosthetic heart valve: Secondary | ICD-10-CM | POA: Diagnosis not present

## 2015-03-03 LAB — CBC
HCT: 31.3 % — ABNORMAL LOW (ref 36.0–46.0)
HEMOGLOBIN: 10 g/dL — AB (ref 12.0–15.0)
MCH: 30 pg (ref 26.0–34.0)
MCHC: 31.9 g/dL (ref 30.0–36.0)
MCV: 94 fL (ref 78.0–100.0)
Platelets: 106 10*3/uL — ABNORMAL LOW (ref 150–400)
RBC: 3.33 MIL/uL — AB (ref 3.87–5.11)
RDW: 16.2 % — ABNORMAL HIGH (ref 11.5–15.5)
WBC: 8.9 10*3/uL (ref 4.0–10.5)

## 2015-03-03 LAB — BASIC METABOLIC PANEL
ANION GAP: 7 (ref 5–15)
BUN: 30 mg/dL — ABNORMAL HIGH (ref 6–20)
CHLORIDE: 92 mmol/L — AB (ref 101–111)
CO2: 30 mmol/L (ref 22–32)
Calcium: 8.3 mg/dL — ABNORMAL LOW (ref 8.9–10.3)
Creatinine, Ser: 1.09 mg/dL — ABNORMAL HIGH (ref 0.44–1.00)
GFR calc Af Amer: 57 mL/min — ABNORMAL LOW (ref >60)
GFR, EST NON AFRICAN AMERICAN: 49 mL/min — AB (ref >60)
Glucose, Bld: 107 mg/dL — ABNORMAL HIGH (ref 65–99)
Potassium: 4.5 mmol/L (ref 3.5–5.1)
Sodium: 129 mmol/L — ABNORMAL LOW (ref 135–145)

## 2015-03-03 LAB — PROTIME-INR
INR: 2.23 — ABNORMAL HIGH (ref 0.00–1.49)
PROTHROMBIN TIME: 24.5 s — AB (ref 11.6–15.2)

## 2015-03-03 MED ORDER — TORSEMIDE 20 MG PO TABS: 40.0000 mg | ORAL_TABLET | Freq: Every day | ORAL | Status: DC

## 2015-03-03 NOTE — Progress Notes (Deleted)
Name: Sandra Turner    DOB: [birth year]  Age: 74 y.o.  MR#: [id number]       PCP:  [name], MD      Insurance: Payor: [employer] MEDICARE / Plan: [facility] MEDICARE / Product Type: *No Product type* /   CC:    Chief Complaint  Patient presents with  . Aortic Stenosis  . Atrial Fibrillation    VS Filed Vitals:   03/03/15 1412  BP: 120/58  Pulse: 96  Height:  (1.575 m)  Weight: 233 lb (105.688 kg)  SpO2: 84%    Weights Current Weight  03/03/15 233 lb (105.688 kg)  02/21/15 223 lb 11.2 oz (101.47 kg)  01/17/15 247 lb (112.038 kg)    Blood Pressure  BP Readings from Last 3 Encounters:  03/03/15 120/58  02/21/15 129/43  01/17/15 136/77     Admit date:  (Not on file) Last encounter with RMR:  Visit date not found   Allergy Bee venom; Latex; and Penicillins  Current Outpatient Prescriptions  Medication Sig Dispense Refill  . albuterol (PROVENTIL) (2.5 MG/3ML) 0.083% nebulizer solution Take 3 mLs (2.5 mg total) by nebulization every 4 (four) hours as needed for wheezing or shortness of breath. 75 mL 12  . amiodarone (PACERONE) 200 MG tablet Take 1 tablet (200 mg total) by mouth daily.    Marland Kitchen amLODipine (NORVASC) 10 MG tablet Take 1 tablet (10 mg total) by mouth daily. 30 tablet   . Ascorbic Acid (VITAMIN C PO) Take 1 tablet by mouth 2 (two) times daily.     . B Complex Vitamins (B COMPLEX PO) Take 1 tablet by mouth 2 (two) times daily.     . budesonide (PULMICORT) 0.25 MG/2ML nebulizer solution Take 2 mLs (0.25 mg total) by nebulization every 6 (six) hours. 60 mL 12  . cephALEXin (KEFLEX) 500 MG capsule Take 500 mg by mouth 4 (four) times daily.    . chlorhexidine (PERIDEX) 0.12 % solution 15 mLs by Mouth Rinse route 2 (two) times daily. 120 mL 0  . FOLIC ACID PO Take 1 tablet by mouth 2 (two) times daily.     . food thickener (THICK IT) POWD As needed  0  . furosemide (LASIX) 20 MG tablet Take 2 tablets (40 mg total) by mouth 2 (two) times daily. 30 tablet 0  .  potassium chloride SA (K-DUR,KLOR-CON) 20 MEQ tablet Take 1 tablet (20 mEq total) by mouth 2 (two) times daily.    . predniSONE (DELTASONE) 10 MG tablet Take 1 tablet (10 mg total) by mouth daily with breakfast.    . traMADol (ULTRAM) 50 MG tablet Take 1 tablet (50 mg total) by mouth every 6 (six) hours as needed (pain). 50 tablet 0  . warfarin (COUMADIN) 4 MG tablet Take 1 tablet (4 mg total) by mouth daily at 6 PM. As directed, bases on INR     No current facility-administered medications for this visit.    Discontinued Meds:   There are no discontinued medications.  Patient Active Problem List   Diagnosis Date Noted  . 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 Pressure ulcer 02/01/2015  . Asthmatic bronchitis with acute exacerbation 01/31/2015  . Mucus plugging of bronchi 01/31/2015  . Atelectasis   . Shortness of breath   . Pleural effusion   . 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 S/P Maze operation for atrial fibrillation 01/19/2015  . Severe aortic stenosis 11/22/2014  . Acute Ardelle Haliburtonry failure with  hypoxia 09/11/2014  . Chronic bronchitis with acute exacerbation 09/11/2014  . COPD exacerbation 09/11/2014  . Family history of malignant hyperthermia 04/26/2014  . COPD (chronic obstructive pulmonary disease) 10/26/2013  . Critical aortic valve stenosis 08/25/2012  . Pulmonary hypertension 08/25/2012  . Atrial fibrillation 08/22/2012  . Obesity 08/22/2012    LABS    Component Value Date/Time   NA 137 02/20/2015 0927   NA 139 02/19/2015 0442   NA 140 02/17/2015 0610   K 4.2 02/20/2015 0927   K 3.8 02/19/2015 0442   K 3.7 02/17/2015 0610   CL 98* 02/20/2015 0927   CL 97* 02/19/2015 0442   CL 96* 02/17/2015 0610   CO2 31 02/20/2015 0927   CO2 35* 02/19/2015 0442   CO2 35* 02/17/2015 0610   GLUCOSE 146* 02/20/2015 0927   GLUCOSE 97 02/19/2015 0442   GLUCOSE 109* 02/17/2015 0610   BUN 39* 02/20/2015 0927   BUN 45*  02/19/2015 0442   BUN 51* 02/17/2015 0610   CREATININE 1.29* 02/20/2015 0927   CREATININE 1.21* 02/19/2015 0442   CREATININE 0.90 02/17/2015 0610   CALCIUM 8.7* 02/20/2015 0927   CALCIUM 8.7* 02/19/2015 0442   CALCIUM 9.0 02/17/2015 0610   GFRNONAA 40* 02/20/2015 0927   GFRNONAA 44* 02/19/2015 0442   GFRNONAA >60 02/17/2015 0610   GFRAA 47* 02/20/2015 0927   GFRAA 51* 02/19/2015 0442   GFRAA >60 02/17/2015 0610   CMP     Component Value Date/Time   NA 137 02/20/2015 0927   K 4.2 02/20/2015 0927   CL 98* 02/20/2015 0927   CO2 31 02/20/2015 0927   GLUCOSE 146* 02/20/2015 0927   BUN 39* 02/20/2015 0927   CREATININE 1.29* 02/20/2015 0927   CALCIUM 8.7* 02/20/2015 0927   PROT 5.6* 02/07/2015 0519   ALBUMIN 2.5* 02/07/2015 0519   AST 55* 02/07/2015 0519   ALT 48 02/07/2015 0519   ALKPHOS 106 02/07/2015 0519   BILITOT 1.0 02/07/2015 0519   GFRNONAA 40* 02/20/2015 0927   GFRAA 47* 02/20/2015 0927       Component Value Date/Time   WBC 10.3 02/17/2015 0610   WBC 13.4* 02/14/2015 0339   WBC 13.2* 02/13/2015 0500   HGB 9.4* 02/17/2015 0610   HGB 9.4* 02/14/2015 0339   HGB 9.1* 02/13/2015 0500   HCT 29.5* 02/17/2015 0610   HCT 30.0* 02/14/2015 0339   HCT 29.1* 02/13/2015 0500   MCV 94.6 02/17/2015 0610   MCV 96.2 02/14/2015 0339   MCV 96.7 02/13/2015 0500    Lipid Panel  No results found for: CHOL, TRIG, HDL, CHOLHDL, VLDL, LDLCALC, LDLDIRECT  ABG    Component Value Date/Time   PHART 7.507* 02/05/2015 0442   PCO2ART 50.1* 02/05/2015 0442   PO2ART 85.0 02/05/2015 0442   HCO3 39.8* 02/05/2015 0442   TCO2 41 02/05/2015 0442   ACIDBASEDEF TEST WILL BE CREDITED 01/20/2015 0930   O2SAT 97.0 02/05/2015 0442     Lab Results  Component Value Date   TSH 0.916 08/22/2012   BNP (last 3 results)  Recent Labs  01/26/15 0235 02/07/15 0520 02/12/15 0435  BNP 216.8* 204.8* 271.9*    ProBNP (last 3 results) No results for input(s): PROBNP in the last 8760  hours.  Cardiac Panel (last 3 results) No results for input(s): CKTOTAL, CKMB, TROPONINI, RELINDX in the last 72 hours.  Iron/TIBC/Ferritin/ %Sat No results found for: IRON, TIBC, FERRITIN, IRONPCTSAT   EKG Orders placed or performed during the hospital encounter of 01/19/15  . EKG  12-Lead  . EKG 12-Lead  . EKG 12-Lead  . EKG 12-Lead  . EKG 12-Lead  . EKG 12-Lead     Prior Assessment and Plan Problem List as of 03/03/2015    Atrial fibrillation   Last Assessment & Plan 08/16/2014 Office Visit Written 08/16/2014  1:03 PM by [name], MD    She continues on Pradaxa without reported bleeding problems.      Obesity   Critical aortic valve stenosis   Last Assessment & Plan 08/16/2014 Office Visit Written 08/16/2014  1:02 PM by [name], MD    Severe calcific aortic stenosis. Patient has associated lung disease with moderate ventilatory defect and airway obstruction, severe pulmonary hypertension, atrial fibrillation, and also reported family history of malignant hyperthermia resulting in 2 deaths with surgery. She is symptomatically stable at this point. Follow-up echocardiogram will be obtained, and she should keep her follow-up with Dr. [name] in the multidisciplinary valve clinic.       Pulmonary hypertension   COPD (chronic obstructive pulmonary disease)   Last Assessment & Plan 10/26/2013 Office Visit Written 10/26/2013  3:58 PM by [name], MD    Reports chronic problems with bronchitis. Followup PFTs to be arranged.      Family history of malignant hyperthermia   Last Assessment & Plan 04/26/2014 Office Visit Written 04/26/2014  2:42 PM by [name], MD    I continue to recommend that she get screened and have provided her the information to make contact with a center that performs this testing. Frankly, I am not certain whether our anesthesia group can help with this or not. Clearly, this will need to be investigated before she is considered  for any type of open aortic valve repair.      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 Severe atelectasis and mucous plugging with bilateral pleural effusions.  EXAM: CHEST  2 VIEW  COMPARISON:  Chest x-ray 02/12/2015.  FINDINGS: There is a right upper extremity PICC with tip terminating in the distal superior vena cava. A feeding tube is seen extending into the abdomen, however, the tip of the feeding tube extends below the lower margin of the image. Lung volumes are low. Bibasilar opacities may reflect areas of atelectasis and/or consolidation, with superimposed moderate bilateral pleural effusions. Mild coarsening of interstitial markings is noted, asymmetrically distributed (right greater than left). No definite evidence of pulmonary edema. Heart size is borderline to mildly enlarged, likely accentuated by low lung volumes and portable technique. Atherosclerosis in the thoracic aorta. Status post median sternotomy for aortic valve replacement and left atrial appendage ligation.  IMPRESSION: 1. Other than decreasing lung volumes, the radiographic appearance the chest is essentially unchanged, as detailed above.   Electronically Signed   By: Trudie Reed M.D.   On: 02/15/2015 08:15   Dg Chest 2 View  02/12/2015   CLINICAL  DATA:  Pneumonia.  EXAM: CHEST  2 VIEW  COMPARISON:  02/10/2015  FINDINGS: Sequelae  of prior aortic valve replacement and left atrial appendage clipping are again identified. Enteric tube courses into the left upper abdomen with tip not imaged. Cardiomediastinal silhouette is unchanged. Lateral image is limited as patient was unable to raise her arms. Small bilateral pleural effusions and bibasilar atelectasis are similar to the prior study. There is new platelike atelectasis in the right midlung. No pneumothorax is identified.  IMPRESSION: Similar appearance of small bilateral pleural effusions and bibasilar atelectasis.   Electronically Signed   By: Sebastian AcheAllen  Grady   On: 02/12/2015 10:25   Dg Chest Port 1 View  02/10/2015   CLINICAL DATA:  Atelectasis  EXAM: PORTABLE CHEST - 1 VIEW  COMPARISON:  02/09/2015  FINDINGS: Bilateral chest tubes have been removed.  No pneumothorax  Right arm PICC tip in the SVC.  Feeding tube in the stomach.  Hypoventilation with bibasilar atelectasis. Slight increase in right lower lobe atelectasis since the prior study. Small right effusion has developed since the prior study. Negative for pulmonary edema.  IMPRESSION: Bilateral chest tube removal without pneumothorax  Mild increase in right lower lobe atelectasis and small right effusion.   Electronically Signed   By: Marlan Palauharles  Clark M.D.   On: 02/10/2015 07:18   Dg Chest Port 1 View  02/09/2015   CLINICAL DATA:  Follow-up of respiratory failure, status post aortic valve replacement and Maze procedure on Jan 19, 2015  EXAM: PORTABLE CHEST - 1 VIEW  COMPARISON:  Chest x-ray of February 08, 2015 at 6:01 a.m.  FINDINGS: The lungs are mildly hypoinflated. There has been interval placement of a feeding tube whose radiodense bulb overlies the mid gastric body. The pulmonary interstitial markings remain increased. The right-sided chest tube tip projects between the seventh and eighth ribs. There is no pneumothorax. There is a small right pleural effusion. The left-sided chest tube is been withdrawn such that its proximal port  lies in the axillary soft tissues. There is no pneumothorax.  The cardiac silhouette remains enlarged. The pulmonary vascularity remains prominent. The prosthetic aortic valve and left atrial appendage clip are visible. The right-sided PICC line tip projects over the midportion of the SVC. There are 8 intact sternal wires.  IMPRESSION: 1. Bilateral pulmonary hypoinflation no evidence of a pneumothorax. There are small bilateral pleural effusions. The right-sided chest tube is in reasonable position. The left-sided chest tube has been partially withdrawn such that its proximal port lies outside the thoracic cavity. 2. Stable cardiomegaly with pulmonary vascular congestion. 3. Critical Value/emergent results were called by telephone at the time of interpretation on 02/09/2015 at 8:03 am to Jillene BucksMary Mills, RN, who verbally acknowledged these results.   Electronically Signed   By: David  SwazilandJordan M.D.   On: 02/09/2015 08:04   Dg Chest Port 1 View  02/08/2015   CLINICAL DATA:  Respiratory failure  EXAM: PORTABLE CHEST - 1 VIEW  COMPARISON:  Portable chest x-ray of February 07, 2015  FINDINGS: There has been interval extubation of the trachea and esophagus. The lungs are mildly hypoinflated. The interstitial markings are coarse. The right-sided chest tube is unchanged in position. No pneumothorax is observed. Small amounts of pleural fluid are present bilaterally. The cardiac silhouette is enlarged. The prosthetic aortic valve and the left atrial appendage clip are unchanged in position. There are 8 intact sternal wires. The central pulmonary vascularity is prominent. The PICC line tip projects over the midportion of  the SVC. The bony thorax exhibits no acute abnormality. There are degenerative changes of both shoulders.  IMPRESSION: 1. Mild hypoinflation post extubation of the trachea. There is no alveolar pneumonia. Small bilateral pleural effusions and left lower lobe atelectasis persist. 2. Cardiomegaly with minimal pulmonary  vascular congestion. 3. The right-sided chest tube is unchanged in position. There is no pneumothorax.   Electronically Signed   By: David  [place] M.D.   On: 02/08/2015 07:32   Dg Chest Port 1 View  02/07/2015   CLINICAL DATA:  Respiratory failure  EXAM: PORTABLE CHEST - 1 VIEW  COMPARISON:  02/05/2015 and 02/06/2015.  FINDINGS: 0632 hr. Endotracheal tube, nasogastric tube, right arm PICC and bilateral chest tubes are unchanged in position. The side hole for the left chest tube is near the chest wall. There is stable cardiomegaly status post median sternotomy. Small left pleural effusion and mild left basilar atelectasis persist. There is interval improved aeration of the right lung base. No pneumothorax.  IMPRESSION: Interval improved aeration of the lung bases with mild left lower lobe atelectasis and small left pleural effusion. Unchanged support system.   Electronically Signed   By: Carey Bullocks M.D.   On: 02/07/2015 07:42   Dg Chest Port 1 View  02/06/2015   CLINICAL DATA:  Acute respiratory failure. On ventilator. Hypoxia. Postop from aortic valve replacement.  EXAM: PORTABLE CHEST - 1 VIEW  COMPARISON:  02/05/2015  FINDINGS: Support lines and tubes in appropriate position. No pneumothorax visualized. Cardiomegaly and pulmonary vascular congestion are stable. Decreased airspace opacity is seen in the right midlung surrounding the right chest tube. There is persistent opacity in the left retrocardiac lung base, which shows no significant change.  IMPRESSION: Decreased opacity in the right midlung surrounding right chest tube.  No significant change in left lower lobe opacity. No pneumothorax visualized.   Electronically Signed   By: Myles Rosenthal M.D.   On: 02/06/2015 10:18   Dg Chest Port 1 View  02/05/2015   CLINICAL DATA:  Respiratory failure.  Follow up chest tube sick.  EXAM: PORTABLE CHEST - 1 VIEW  COMPARISON:  02/04/2015  FINDINGS: Changes cardiac surgery and valve replacement are stable.  Cardiopericardial silhouette is moderately enlarged.  Hazy airspace opacity has developed along the tract of the right-sided chest tube. This is likely atelectasis. Position of the bilateral chest tubes is stable. There is no convincing pneumothorax on this semi-erect study.  Left lung base opacity is stable consistent with atelectasis. Probable small pleural effusions. No overt pulmonary edema.  Endotracheal tube and nasogastric tube are stable and well positioned. Right PICC is stable with its tip in the lower superior vena cava.  IMPRESSION: 1. Hazy opacity has developed adjacent to the right sided chest tube since the prior study, most likely atelectasis. 2. No other change. No convincing pneumothorax. No overt edema. Persistent left lung base atelectasis. Probable small effusions.   Electronically Signed   By: Amie Portland M.D.   On: 02/05/2015 07:44   Dg Chest Port 1 View  02/04/2015   CLINICAL DATA:  Subsequent encounter for chest tubes  EXAM: PORTABLE CHEST - 1 VIEW  COMPARISON:  02/03/2015.  02/02/2015.  FINDINGS: 0957 hrs. Endotracheal tube tip is 2.6 cm above the base of the carina. NG tube noted with distal tip positioned in the stomach. Right chest tube is new in the interval. There is been marked decrease in right pleural effusion. No evidence for right pneumothorax. Left chest tube is also new in  the interval with associated decrease in left pleural effusion. Right PICC line tip overlies the mid SVC. Cardiopericardial silhouette is enlarged.  IMPRESSION: Interval placement of bilateral chest tubes with marked decrease in pleural effusions and basilar atelectasis. No evidence for pneumothorax.   Electronically Signed   By: Kennith Center M.D.   On: 02/04/2015 10:11   Dg Chest Port 1 View  02/03/2015   CLINICAL DATA:  Hypoxia.  Congestive heart failure  EXAM: PORTABLE CHEST - 1 VIEW  COMPARISON:  February 02, 2015  FINDINGS: Endotracheal tube tip is 2.1 cm above the carina. Nasogastric tube tip and side  port are below the diaphragm. Central catheter tip is in the superior vena cava. No pneumothorax. There are bilateral effusions with interstitial and patchy alveolar edema bilaterally. Heart is enlarged with pulmonary venous hypertension. No new opacity. Patient is status post aortic valve replacement. There is a left atrial appendage clamp, stable. There is atherosclerotic change in aorta.  IMPRESSION: Tube and catheter positions as described without pneumothorax. Congestive heart failure, unchanged from 1 day prior. No new opacity.   Electronically Signed   By: Bretta Bang III M.D.   On: 02/03/2015 07:58   Dg Chest Port 1 View  02/02/2015   CLINICAL DATA:  Pneumonia.  Aortic valve replacement.  EXAM: PORTABLE CHEST - 1 VIEW  COMPARISON:  02/01/2015  FINDINGS: Endotracheal tube is in similar position to the prior study, likely approximately 2 cm above the carina although the carina suboptimally visualized. Right PICC is unchanged. Enteric tube courses to the left upper abdomen with tip not imaged. Sequelae of prior aortic valve replacement and left atrial appendage clipping are again identified. Cardiac silhouette remains enlarged. Veiling opacities in the lung bases do not appear significantly changed representing a combination a pleural effusions and parenchymal lung opacities. No pneumothorax is identified.  IMPRESSION: Bilateral pleural effusions and bibasilar atelectasis/consolidation without significant interval change.   Electronically Signed   By: Sebastian Ache   On: 02/02/2015 08:23   Dg Abd Portable 1v  02/08/2015   CLINICAL DATA:  Evaluate feeding tube placement.  EXAM: PORTABLE ABDOMEN - 1 VIEW  COMPARISON:  Chest radiograph 02/08/2015  FINDINGS: Feeding tube in the left upper abdomen. Tube is likely in the proximal stomach region. Again noted are bilateral chest tubes. Postsurgical changes in the chest. PICC line extends into the SVC but difficult to evaluate the tip. Persistent opacification  at the left lung base.  IMPRESSION: Feeding tube in the proximal stomach region.   Electronically Signed   By: Richarda Overlie M.D.   On: 02/08/2015 16:20   Dg Swallowing Func-speech Pathology  02/17/2015    Objective Swallowing Evaluation:    Patient Details  Name: Sandra Turner MRN: [id number] Date of Birth: 1941/01/08  Today's Date: 02/17/2015 Time: SLP Start Time (ACUTE ONLY): 0955-SLP Stop Time (ACUTE ONLY): 1016 SLP Time Calculation (min) (ACUTE ONLY): 21 min  Past Medical History:  Past Medical History  Diagnosis Date  . Arthritis   . Chronic atrial fibrillation     takes Pradaxa daily  . Aortic stenosis     Severe by echo August 2015  . Pneumonia 09/2014  . History of bronchitis 2015  . Joint pain   . Back pain     reason unknown  . Family history of adverse reaction to anesthesia     uncle with MH in the 60's,cousin in the 37's with MH  . 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 S/P aortic valve replacement with bioprosthetic valve and aortic root  enlargement 01/19/2015    23 mm [facility] Ease bovine pericardial tissue valve with bovine  pericardial patch enlargement of the aortic root   Past Surgical History:  Past Surgical History  Procedure Laterality Date  . None    . Left and right heart catheterization with coronary angiogram N/A  11/01/2014    Procedure: LEFT AND RIGHT HEART CATHETERIZATION WITH CORONARY ANGIOGRAM;   Surgeon: [name], MD;  Location: [facility] CATH LAB;  Service:  Cardiovascular;  Laterality: N/A;  . Aortic valve replacement N/A 01/19/2015    Procedure: AORTIC VALVE REPLACEMENT (AVR);  Surgeon: Purcell Nails,  MD;  Location: [facility] OR;  Service: Open Heart Surgery;  Laterality: N/A;  . Maze N/A 01/19/2015    Procedure: MAZE;  Surgeon: Purcell Nails, MD;  Location: [facility] OR;   Service: Open Heart Surgery;  Laterality: N/A;  . Aortic root enlargement N/A 01/19/2015     Procedure:  AORTIC ROOT ENLARGEMENT;  Surgeon: Purcell Nails, MD;   Location: [facility] OR;  Service: Open Heart Surgery;  Laterality: N/A;  . Tee without cardioversion N/A 01/19/2015    Procedure: TRANSESOPHAGEAL ECHOCARDIOGRAM (TEE);  Surgeon: Purcell Nails, MD;  Location: [facility] OR;  Service: Open Heart Surgery;  Laterality:  N/A;   HPI:  Other Pertinent Information: Pt is a 74 year old female who underwent AVR  on 5/18. Developed VDRF with hypoxemic respiratory failure likely due to  HCAP, severe atelectasis w/ mucous plugging, and bilateral pleural  effusions complicating chronic respiratory failure due to tracheomalacia,  chronic diastolic CHF, morbid obesity, OSA, COPD and pulmonary  hypertension.Intubated from 5/18-6/6. Bronch on 6/6 showed significant  tracheomalacia and acute inflammation and thick, glue like secretions.MD  concerned for an allergic bronchopulmonary aspergillosis like syndrome.   No Data Recorded  Assessment / Plan / Recommendation CHL IP CLINICAL IMPRESSIONS 02/17/2015  Therapy Diagnosis Moderate pharyngeal phase dysphagia  Clinical Impression Pts swallow function has improved since last MBS,  particularly with generalized strength, aiding pt in compensation for  persistent incomplete airway closure. With thin and nectar thick liquids  the pt penetrates and aspirates during the swallow, though sensation of  aspirate has improved with pt consistently attempting to eject aspirate,  though not always successful. A chin tuck (or head turn) does not  eliminate penetrate with thin and nectar, though a chin tuck is successful  with solids, puree and honey. Recommend pt initiate a Dys 3 (mechanical  soft) diet with honey thick liquids with a chin tuck with every swallow.  Discussed with TCTS PA who plans to keep Panda in place until pt  demonstrates tolerance.       CHL IP TREATMENT RECOMMENDATION 02/17/2015  Treatment Recommendations Therapy as outlined in treatment plan below     CHL IP DIET  RECOMMENDATION 02/17/2015  SLP Diet Recommendations Dysphagia 3 (Mech soft);Honey  Liquid Administration via (None)  Medication Administration Crushed with puree  Compensations Chin tuck;Clear throat intermittently;Small sips/bites  Postural Changes and/or Swallow Maneuvers (None)     CHL IP OTHER RECOMMENDATIONS 02/17/2015  Recommended Consults (None)  Oral Care Recommendations Oral care BID  Other Recommendations Order thickener from pharmacy     No flowsheet data found.   CHL IP FREQUENCY AND DURATION 02/17/2015  Speech Therapy Frequency (ACUTE ONLY) min 2x/week  Treatment Duration 2  weeks     Pertinent Vitals/Pain NA    SLP Swallow Goals No flowsheet data found.  No flowsheet data found.    CHL IP REASON FOR REFERRAL 02/17/2015  Reason for Referral Objectively evaluate swallowing function     CHL IP ORAL PHASE 02/17/2015  Lips (None)  Tongue (None)  Mucous membranes (None)  Nutritional status (None)  Other (None)  Oxygen therapy (None)  Oral Phase WFL  Oral - Pudding Teaspoon (None)  Oral - Pudding Cup (None)  Oral - Honey Teaspoon (None)  Oral - Honey Cup (None)  Oral - Honey Syringe (None)  Oral - Nectar Teaspoon (None)  Oral - Nectar Cup (None)  Oral - Nectar Straw (None)  Oral - Nectar Syringe (None)  Oral - Ice Chips (None)  Oral - Thin Teaspoon (None)  Oral - Thin Cup (None)  Oral - Thin Straw (None)  Oral - Thin Syringe (None)  Oral - Puree (None)  Oral - Mechanical Soft (None)  Oral - Regular (None)  Oral - Multi-consistency (None)  Oral - Pill (None)  Oral Phase - Comment (None)      CHL IP PHARYNGEAL PHASE 02/17/2015  Pharyngeal Phase Impaired  Pharyngeal - Pudding Teaspoon (None)  Penetration/Aspiration details (pudding teaspoon) (None)  Pharyngeal - Pudding Cup (None)  Penetration/Aspiration details (pudding cup) (None)  Pharyngeal - Honey Teaspoon (None)  Penetration/Aspiration details (honey teaspoon) (None)  Pharyngeal - Honey Cup (None)  Penetration/Aspiration details (honey cup) (None)  Pharyngeal -  Honey Syringe (None)  Penetration/Aspiration details (honey syringe) (None)  Pharyngeal - Nectar Teaspoon (None)  Penetration/Aspiration details (nectar teaspoon) (None)  Pharyngeal - Nectar Cup (None)  Penetration/Aspiration details (nectar cup) (None)  Pharyngeal - Nectar Straw (None)  Penetration/Aspiration details (nectar straw) (None)  Pharyngeal - Nectar Syringe (None)  Penetration/Aspiration details (nectar syringe) (None)  Pharyngeal - Ice Chips (None)  Penetration/Aspiration details (ice chips) (None)  Pharyngeal - Thin Teaspoon (None)  Penetration/Aspiration details (thin teaspoon) (None)  Pharyngeal - Thin Cup (None)  Penetration/Aspiration details (thin cup) (None)  Pharyngeal - Thin Straw (None)  Penetration/Aspiration details (thin straw) (None)  Pharyngeal - Thin Syringe (None)  Penetration/Aspiration details (thin syringe') (None)  Pharyngeal - Puree (None)  Penetration/Aspiration details (puree) (None)  Pharyngeal - Mechanical Soft (None)  Penetration/Aspiration details (mechanical soft) (None)  Pharyngeal - Regular (None)  Penetration/Aspiration details (regular) (None)  Pharyngeal - Multi-consistency (None)  Penetration/Aspiration details (multi-consistency) (None)  Pharyngeal - Pill (None)  Penetration/Aspiration details (pill) (None)  Pharyngeal Comment (None)      CHL IP CERVICAL ESOPHAGEAL PHASE 02/17/2015  Cervical Esophageal Phase WFL  Pudding Teaspoon (None)  Pudding Cup (None)  Honey Teaspoon (None)  Honey Cup (None)  Honey Straw (None)  Nectar Teaspoon (None)  Nectar Cup (None)  Nectar Straw (None)  Nectar Sippy Cup (None)  Thin Teaspoon (None)  Thin Cup (None)  Thin Straw (None)  Thin Sippy Cup (None)  Cervical Esophageal Comment (None)    No flowsheet data found.        Harlon Ditty, [place] CCC-SLP [phone]  Claudine Mouton 02/17/2015, 10:53 AM

## 2015-03-03 NOTE — Progress Notes (Signed)
Cardiology Office Note   Date:  03/03/2015   ID:  Sandra Turner, DOB 12-12-1940, MRN [id number]  PCP:  [name], MD  Cardiologist:  [place]/ Joni Reining, NP   Chief Complaint  Patient presents with  . Aortic Stenosis  . Atrial Fibrillation      History of Present Illness: Sandra Turner is a 74 y.o. female who presents for ongoing assessment and management of aortic valve stenosis, chronic atrial fibrillation, COPDhistory, pulmonary hypertension.  The patient was seen by [name] in February 2016, cardiac echocardiogram was completed revealing severe aortic valve stenosis.  She underwent a left and right cardiac catheterization by Dr. Verne Carrow on 11/30/2014 which the presence of aortic valve stenosis with peak to peak and mean valvular gradients, measuring 43, and 29 mm mercury, respectively.  There was no significant coronary artery disease.  There was moderate pulmonary hypertension.  The patient was scheduled for aortic valve replacement and maze procedure on 01/19/2015.  The patient had aortic valve replacement with bioprosthetic tissue valve 23 mm Edwards magna E., bovine pericardial tissue valve per Dr. Barry Dienes.  She tolerated the procedure well, but did have a complicated post-operative course. She began to have  some pharyngeal phase dysphagia postoperatively with respiratory distress.Marland Kitchen Speech therapy saw her and a PANDA  was placed. She was then transitioned slowly to a mechanical soft diet.she had left vocal cord paralysis.  She was placed on a thickener for fluids.  She was seen by Dr. Sherryl Manges on 02/14/2015.  Postoperatively, as it was felt that she was in ventricular tachycardia, but actually found atrial fibrillation with motion artifact. She was discharged home on 02/23/2015 was found to be medically stable at that time.  She is here for post hospitalization followup.  She comes today with fluid retention, decreased O2 Sat of 84%, significant LEE, and  dyspnea at rest. She denies pain or dizziness.   Past Medical History  Diagnosis Date  . Arthritis   . Chronic atrial fibrillation     takes Pradaxa daily  . Aortic stenosis     Severe by echo August 2015  . Pneumonia 09/2014  . History of bronchitis 2015  . Joint pain   . Back pain     reason unknown  . Family history of adverse reaction to anesthesia     uncle with MH in the 60's,cousin in the 9's with MH  . 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 None    . Left and right heart catheterization with coronary angiogram N/A 11/01/2014    Procedure: LEFT AND RIGHT HEART CATHETERIZATION WITH CORONARY ANGIOGRAM;  Surgeon: [name], MD;  Location: [facility] CATH LAB;  Service: Cardiovascular;  Laterality: N/A;  . Aortic valve replacement N/A 01/19/2015    Procedure: AORTIC VALVE REPLACEMENT (AVR);  Surgeon: Purcell Nails, MD;  Location: [facility] OR;  Service: Open Heart Surgery;  Laterality: N/A;  . Maze N/A 01/19/2015    Procedure: MAZE;  Surgeon: Purcell Nails, MD;  Location: [facility] OR;  Service: Open Heart Surgery;  Laterality: N/A;  . Aortic root enlargement N/A 01/19/2015  Procedure:  AORTIC ROOT ENLARGEMENT;  Surgeon: Purcell Nails, MD;  Location: [facility] OR;  Service: Open Heart Surgery;  Laterality: N/A;  . Tee without cardioversion N/A 01/19/2015    Procedure: TRANSESOPHAGEAL ECHOCARDIOGRAM (TEE);  Surgeon: Purcell Nails, MD;  Location: [facility] OR;  Service: Open Heart Surgery;  Laterality: N/A;     Current Outpatient Prescriptions  Medication Sig Dispense Refill  . albuterol (PROVENTIL) (2.5  MG/3ML) 0.083% nebulizer solution Take 3 mLs (2.5 mg total) by nebulization every 4 (four) hours as needed for wheezing or shortness of breath. 75 mL 12  . amiodarone (PACERONE) 200 MG tablet Take 1 tablet (200 mg total) by mouth daily.    Marland Kitchen amLODipine (NORVASC) 10 MG tablet Take 1 tablet (10 mg total) by mouth daily. 30 tablet   . Ascorbic Acid (VITAMIN C PO) Take 1 tablet by mouth 2 (two) times daily.     . B Complex Vitamins (B COMPLEX PO) Take 1 tablet by mouth 2 (two) times daily.     . budesonide (PULMICORT) 0.25 MG/2ML nebulizer solution Take 2 mLs (0.25 mg total) by nebulization every 6 (six) hours. 60 mL 12  . cephALEXin (KEFLEX) 500 MG capsule Take 500 mg by mouth 4 (four) times daily.    . chlorhexidine (PERIDEX) 0.12 % solution 15 mLs by Mouth Rinse route 2 (two) times daily. 120 mL 0  . FOLIC ACID PO Take 1 tablet by mouth 2 (two) times daily.     . food thickener (THICK IT) POWD As needed  0  . potassium chloride SA (K-DUR,KLOR-CON) 20 MEQ tablet Take 1 tablet (20 mEq total) by mouth 2 (two) times daily.    . predniSONE (DELTASONE) 10 MG tablet Take 1 tablet (10 mg total) by mouth daily with breakfast.    . traMADol (ULTRAM) 50 MG tablet Take 1 tablet (50 mg total) by mouth every 6 (six) hours as needed (pain). 50 tablet 0  . warfarin (COUMADIN) 4 MG tablet Take 1 tablet (4 mg total) by mouth daily at 6 PM. As directed, bases on INR    . torsemide (DEMADEX) 20 MG tablet Take 2 tablets (40 mg total) by mouth daily. 90 tablet 3   No current facility-administered medications for this visit.    Allergies:   Bee venom; Latex; and Penicillins    Social History:  The patient  reports that she has never smoked. She does not have any smokeless tobacco history on file. She reports that she does not drink alcohol or use illicit drugs.   Family History:  The patient's family history includes Breast cancer in her mother; Cancer in her father; Congestive Heart Failure in her mother; Malignant  hyperthermia in her cousin.    ROS: .   Turner other systems are reviewed and negative.Unless otherwise mentioned in H&P above.   PHYSICAL EXAM: VS:  BP 120/58 mmHg  Pulse 96  Ht 5\' 2"  (1.575 m)  Wt 233 lb (105.688 kg)  BMI 42.61 kg/m2  SpO2 84% , BMI Body mass index is 42.61 kg/(m^2). GEN: Well nourished, well developed, in no acute distress HEENT: normal Neck: no JVD, carotid bruits, or masses Cardiac: RRR distant heart sounds, ; no murmurs, rubs, or gallops,no edema  Respiratory:Diminished breath sounds in the bases, with increased work of breathing.  GI: soft, nontender, nondistended, + BS MS: no deformity or atrophy2+ pretibial edema to the knees. Sternotomy scar is healing well.  Skin: warm and dry, no rash Neuro:  Strength and sensation  are intact Psych: euthymic mood, full affect   Recent Labs: 02/07/2015: ALT 48 02/12/2015: B Natriuretic Peptide 271.9* 02/15/2015: Magnesium 2.4 02/20/2015: BUN 39*; Creatinine, Ser 1.29*; Potassium 4.2; Sodium 137 03/03/2015: Hemoglobin 10.0*; Platelets PENDING    Lipid Panel No results found for: CHOL, TRIG, HDL, CHOLHDL, VLDL, LDLCALC, LDLDIRECT    Wt Readings from Last 3 Encounters:  03/03/15 233 lb (105.688 kg)  02/21/15 223 lb 11.2 oz (101.47 kg)  01/17/15 247 lb (112.038 kg)      Other studies Reviewed: Additional studies/ records that were reviewed today include:None Review of the above records demonstrates: N/A   ASSESSMENT AND PLAN:  1. Acute on Chronic Diastolic CHF: She appears to be fluid overloaded and mildly dyspneic at rest but complains of more dyspnea with exertion. She has not been taking lasix as directed. She is only taking lasix 20 mg daily instead of 40 mg BID. I will change her to torsemide, 40 mg daily, with the exception of the next two days, and have her take 40 mg in the am and 20 mg in the pm. She will have a Pro=BNP , CBC and BMET along with a CXR completed to day. If she does not get results from her  diuretic change and dose increase she will need to come to ER for admission and IV diureses. I will see her in one week if she does find relief.  2.  S/P Aortic Valve Replacement: She has not been see by CVTS yet. She has no complaints of chest pain. Sternotomy incision is well healed.   3. Atrial fib: She has not had INR checked while on coumadin since being discharged. I will have STAT PT INR completed with labs to ascertain her status, as she is also on abx therapy for cellulitis.     Current medicines are reviewed at length with the patient today.    Labs/ tests ordered today include: CBC. BMET, PT- INR, CXR.   Orders Placed This Encounter  Procedures  . DG Chest 2 View  . INR/PT  . CBC  . Basic Metabolic Panel (BMET)     Disposition:   FU with 1 week.   Signed, Joni ReiningKathryn Estevan Kersh, NP  03/03/2015 3:29 PM    [facility] [address], [place], [place] [address] Phone: [phone]; Fax: [phone]

## 2015-03-03 NOTE — Patient Instructions (Signed)
Your physician recommends that you schedule a follow-up appointment in: in 1 week  Please get STAT lab work and chest x-ray NOW  STOP Lasix   START Torsemide 40 mg daily- for the next 2 days ONLY, take 40 mg in the am and 20 mg in the evening, then go back to 40 mg daily     Thank you for choosing [facility] !

## 2015-03-04 ENCOUNTER — Encounter: Payer: Medicare Other | Admitting: Adult Health

## 2015-03-05 LAB — FUNGUS CULTURE W SMEAR
Fungal Smear: NONE SEEN
Special Requests: NORMAL

## 2015-03-08 ENCOUNTER — Telehealth: Payer: Self-pay

## 2015-03-08 ENCOUNTER — Other Ambulatory Visit: Payer: Self-pay

## 2015-03-08 DIAGNOSIS — I4891 Unspecified atrial fibrillation: Secondary | ICD-10-CM

## 2015-03-08 DIAGNOSIS — E871 Hypo-osmolality and hyponatremia: Secondary | ICD-10-CM

## 2015-03-08 NOTE — Telephone Encounter (Signed)
LMTCB at home number.work number not correct,mailed lab slip,has f/u apt on 03/14/15 with Harriet PhoK Lawrence NP

## 2015-03-08 NOTE — Telephone Encounter (Signed)
-----   Message from Jodelle GrossKathryn M Lawrence, NP sent at 03/03/2015  4:50 PM EDT ----- Reviewed labs and CXR. Labs demonstrate hyponatremia, likely from diastolic CHF. Will need to repeat labs prior to her appt next week. CXR demonstrates pneumonia for which she is already being treated. INR 2.23. She will need appt with Misty StanleyLisa for establishment in coumadin clinic as she is newly on coumadin therapy. No evidence of anemia. Still waiting on Pro-BNP. She should keep feet elevated.

## 2015-03-08 NOTE — Telephone Encounter (Signed)
Pt must get BNP at lab,serum only stable for 1 day and labs were drawn 03/03/15 and orders did not include this.I have LM for pt

## 2015-03-08 NOTE — Telephone Encounter (Signed)
-----   Message from Kathryn M Lawrence, NP sent at 03/03/2015  4:50 PM EDT ----- Reviewed labs and CXR. Labs demonstrate hyponatremia, likely from diastolic CHF. Will need to repeat labs prior to her appt next week. CXR demonstrates pneumonia for which she is already being treated. INR 2.23. She will need appt with Lisa for establishment in coumadin clinic as she is newly on coumadin therapy. No evidence of anemia. Still waiting on Pro-BNP. She should keep feet elevated. 

## 2015-03-10 ENCOUNTER — Telehealth: Payer: Self-pay | Admitting: Pulmonary Disease

## 2015-03-10 NOTE — Telephone Encounter (Signed)
Hospital follow up has been moved to 04/07/15 at 2:15pm. Nothing further was needed.

## 2015-03-11 ENCOUNTER — Inpatient Hospital Stay: Payer: Medicare Other | Admitting: Pulmonary Disease

## 2015-03-12 ENCOUNTER — Inpatient Hospital Stay (HOSPITAL_COMMUNITY)
Admission: EM | Admit: 2015-03-12 | Discharge: 2015-03-20 | DRG: 291 | Disposition: A | Discharge status: Home Health | Payer: Medicare Other | Attending: Internal Medicine | Admitting: Internal Medicine

## 2015-03-12 ENCOUNTER — Encounter (HOSPITAL_COMMUNITY): Payer: Self-pay

## 2015-03-12 ENCOUNTER — Emergency Department (HOSPITAL_COMMUNITY): Payer: Medicare Other

## 2015-03-12 DIAGNOSIS — J90 Pleural effusion, not elsewhere classified: Secondary | ICD-10-CM

## 2015-03-12 DIAGNOSIS — N179 Acute kidney failure, unspecified: Secondary | ICD-10-CM | POA: Diagnosis present

## 2015-03-12 DIAGNOSIS — D638 Anemia in other chronic diseases classified elsewhere: Secondary | ICD-10-CM | POA: Diagnosis present

## 2015-03-12 DIAGNOSIS — Z953 Presence of xenogenic heart valve: Secondary | ICD-10-CM

## 2015-03-12 DIAGNOSIS — I5033 Acute on chronic diastolic (congestive) heart failure: Secondary | ICD-10-CM | POA: Diagnosis present

## 2015-03-12 DIAGNOSIS — I272 Other secondary pulmonary hypertension: Secondary | ICD-10-CM | POA: Diagnosis present

## 2015-03-12 DIAGNOSIS — Z803 Family history of malignant neoplasm of breast: Secondary | ICD-10-CM

## 2015-03-12 DIAGNOSIS — Z7901 Long term (current) use of anticoagulants: Secondary | ICD-10-CM | POA: Diagnosis not present

## 2015-03-12 DIAGNOSIS — N183 Chronic kidney disease, stage 3 (moderate): Secondary | ICD-10-CM | POA: Diagnosis present

## 2015-03-12 DIAGNOSIS — R0600 Dyspnea, unspecified: Secondary | ICD-10-CM | POA: Diagnosis present

## 2015-03-12 DIAGNOSIS — I5031 Acute diastolic (congestive) heart failure: Secondary | ICD-10-CM | POA: Diagnosis not present

## 2015-03-12 DIAGNOSIS — J9601 Acute respiratory failure with hypoxia: Secondary | ICD-10-CM | POA: Diagnosis present

## 2015-03-12 DIAGNOSIS — Z952 Presence of prosthetic heart valve: Secondary | ICD-10-CM

## 2015-03-12 DIAGNOSIS — Y95 Nosocomial condition: Secondary | ICD-10-CM | POA: Diagnosis present

## 2015-03-12 DIAGNOSIS — Z8679 Personal history of other diseases of the circulatory system: Secondary | ICD-10-CM

## 2015-03-12 DIAGNOSIS — J449 Chronic obstructive pulmonary disease, unspecified: Secondary | ICD-10-CM | POA: Diagnosis present

## 2015-03-12 DIAGNOSIS — Z8249 Family history of ischemic heart disease and other diseases of the circulatory system: Secondary | ICD-10-CM | POA: Diagnosis not present

## 2015-03-12 DIAGNOSIS — Z6841 Body Mass Index (BMI) 40.0 and over, adult: Secondary | ICD-10-CM

## 2015-03-12 DIAGNOSIS — Z954 Presence of other heart-valve replacement: Secondary | ICD-10-CM | POA: Diagnosis not present

## 2015-03-12 DIAGNOSIS — J189 Pneumonia, unspecified organism: Secondary | ICD-10-CM | POA: Diagnosis present

## 2015-03-12 DIAGNOSIS — Z807 Family history of other malignant neoplasms of lymphoid, hematopoietic and related tissues: Secondary | ICD-10-CM

## 2015-03-12 DIAGNOSIS — Z9889 Other specified postprocedural states: Secondary | ICD-10-CM

## 2015-03-12 DIAGNOSIS — J438 Other emphysema: Secondary | ICD-10-CM | POA: Diagnosis not present

## 2015-03-12 DIAGNOSIS — D649 Anemia, unspecified: Secondary | ICD-10-CM

## 2015-03-12 DIAGNOSIS — I482 Chronic atrial fibrillation: Secondary | ICD-10-CM | POA: Diagnosis present

## 2015-03-12 DIAGNOSIS — I509 Heart failure, unspecified: Secondary | ICD-10-CM | POA: Diagnosis not present

## 2015-03-12 DIAGNOSIS — R0602 Shortness of breath: Secondary | ICD-10-CM | POA: Diagnosis not present

## 2015-03-12 HISTORY — DX: Pulmonary hypertension, unspecified: I27.20

## 2015-03-12 HISTORY — DX: Encounter for observation for other suspected diseases and conditions ruled out: Z03.89

## 2015-03-12 HISTORY — DX: Paralysis of vocal cords and larynx, unspecified: J38.00

## 2015-03-12 HISTORY — DX: Other specified diseases of upper respiratory tract: J39.8

## 2015-03-12 HISTORY — DX: Chronic diastolic (congestive) heart failure: I50.32

## 2015-03-12 HISTORY — DX: Anemia, unspecified: D64.9

## 2015-03-12 HISTORY — DX: Reserved for inherently not codable concepts without codable children: IMO0001

## 2015-03-12 HISTORY — DX: Acute kidney failure, unspecified: N17.9

## 2015-03-12 HISTORY — DX: Pleural effusion, not elsewhere classified: J90

## 2015-03-12 LAB — COMPREHENSIVE METABOLIC PANEL
ALT: 27 U/L (ref 14–54)
AST: 27 U/L (ref 15–41)
Albumin: 3.2 g/dL — ABNORMAL LOW (ref 3.5–5.0)
Alkaline Phosphatase: 142 U/L — ABNORMAL HIGH (ref 38–126)
Anion gap: 10 (ref 5–15)
BILIRUBIN TOTAL: 0.7 mg/dL (ref 0.3–1.2)
BUN: 48 mg/dL — ABNORMAL HIGH (ref 6–20)
CHLORIDE: 93 mmol/L — AB (ref 101–111)
CO2: 33 mmol/L — AB (ref 22–32)
CREATININE: 1.88 mg/dL — AB (ref 0.44–1.00)
Calcium: 8.5 mg/dL — ABNORMAL LOW (ref 8.9–10.3)
GFR, EST AFRICAN AMERICAN: 30 mL/min — AB (ref >60)
GFR, EST NON AFRICAN AMERICAN: 26 mL/min — AB (ref >60)
GLUCOSE: 97 mg/dL (ref 65–99)
Potassium: 3.9 mmol/L (ref 3.5–5.1)
Sodium: 136 mmol/L (ref 135–145)
Total Protein: 6.8 g/dL (ref 6.5–8.1)

## 2015-03-12 LAB — CBC WITH DIFFERENTIAL/PLATELET
BASOS ABS: 0 10*3/uL (ref 0.0–0.1)
Basophils Relative: 0 % (ref 0–1)
Eosinophils Absolute: 0.2 10*3/uL (ref 0.0–0.7)
Eosinophils Relative: 3 % (ref 0–5)
HCT: 28.8 % — ABNORMAL LOW (ref 36.0–46.0)
HEMOGLOBIN: 9.1 g/dL — AB (ref 12.0–15.0)
LYMPHS ABS: 0.9 10*3/uL (ref 0.7–4.0)
LYMPHS PCT: 12 % (ref 12–46)
MCH: 29.5 pg (ref 26.0–34.0)
MCHC: 31.6 g/dL (ref 30.0–36.0)
MCV: 93.5 fL (ref 78.0–100.0)
Monocytes Absolute: 0.8 10*3/uL (ref 0.1–1.0)
Monocytes Relative: 11 % (ref 3–12)
NEUTROS PCT: 74 % (ref 43–77)
Neutro Abs: 5.2 10*3/uL (ref 1.7–7.7)
PLATELETS: 222 10*3/uL (ref 150–400)
RBC: 3.08 MIL/uL — AB (ref 3.87–5.11)
RDW: 16.8 % — ABNORMAL HIGH (ref 11.5–15.5)
WBC: 7 10*3/uL (ref 4.0–10.5)

## 2015-03-12 LAB — TROPONIN I: Troponin I: 0.03 ng/mL (ref <0.031)

## 2015-03-12 LAB — BRAIN NATRIURETIC PEPTIDE: B Natriuretic Peptide: 178 pg/mL — ABNORMAL HIGH (ref 0.0–100.0)

## 2015-03-12 MED ORDER — FUROSEMIDE 10 MG/ML IJ SOLN
40.0000 mg | Freq: Once | INTRAMUSCULAR | Status: AC
  Administered 2015-03-12: 40 mg via INTRAVENOUS
  Filled 2015-03-12: qty 4

## 2015-03-12 NOTE — H&P (Signed)
Sandra Turner is an 74 y.o. female.    Dr. Theda Belfast (pcp)   Chief Complaint: dyspnea HPI: 74 yo female with hx of Pafib (CHADS2=2), hx of AS with AVR, apparently c/o dyspnea since her surgery which was worse this evening.  Pt notes slight leg swelling bilaterally and slight orthopnea., but denies wt gain.  Pt presented to ED and was noted to be anemic with hgb 9.1.  Pt denies brbpr, black stool.  Pt also noted to have ARF, and on CXR. => Bilateral airspace disease which is asymmetric to the left. Probable small pleural effusions.  Pt will be admitted for dyspnea ? CHF vs pneumonia.    Past Medical History  Diagnosis Date  . Arthritis   . Chronic atrial fibrillation     takes Pradaxa daily  . Aortic stenosis     Severe by echo August 2015  . Pneumonia 09/2014  . History of bronchitis 2015  . Joint pain   . Back pain     reason unknown  . Family history of adverse reaction to anesthesia     uncle with MH in the 51's,cousin in the 33's with MH  . 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 COPD (chronic obstructive pulmonary disease)     Past Surgical History  Procedure Laterality Date  . None    . Left and right heart catheterization with coronary angiogram N/A 11/01/2014    Procedure: LEFT AND RIGHT HEART CATHETERIZATION WITH CORONARY ANGIOGRAM;  Surgeon: Burnell Blanks, MD;  Location: [facility] CATH LAB;  Service: Cardiovascular;  Laterality: N/A;  . Aortic valve replacement N/A 01/19/2015    Procedure: AORTIC VALVE REPLACEMENT (AVR);  Surgeon: Rexene Alberts, MD;  Location: [place];  Service: Open Heart Surgery;  Laterality: N/A;  . Maze N/A 01/19/2015     Procedure: MAZE;  Surgeon: Rexene Alberts, MD;  Location: [place];  Service: Open Heart Surgery;  Laterality: N/A;  . Aortic root enlargement N/A 01/19/2015    Procedure:  AORTIC ROOT ENLARGEMENT;  Surgeon: Rexene Alberts, MD;  Location: [place];  Service: Open Heart Surgery;  Laterality: N/A;  . Tee without cardioversion N/A 01/19/2015    Procedure: TRANSESOPHAGEAL ECHOCARDIOGRAM (TEE);  Surgeon: Rexene Alberts, MD;  Location: [place];  Service: Open Heart Surgery;  Laterality: N/A;    Family History  Problem Relation Age of Onset  . Breast cancer Mother   . Congestive Heart Failure Mother   . Cancer Father     ? Lymphoma  . Malignant hyperthermia Cousin    Social History:  reports that she has never smoked. She does not have any smokeless tobacco history on file. She reports that she does not drink alcohol or use illicit drugs.  Allergies:  Allergies  Allergen Reactions  . Bee Venom Swelling  . Latex Rash  . Penicillins Rash   Medications reviewed   Results for orders placed or performed during the hospital encounter of 03/12/15 (from the past 48 hour(s))  CBC with Differential     Status: Abnormal   Collection Time: 03/12/15  9:56 PM  Result Value Ref Range   WBC 7.0 4.0 - 10.5 K/uL  RBC 3.08 (L) 3.87 - 5.11 MIL/uL   Hemoglobin 9.1 (L) 12.0 - 15.0 g/dL   HCT 28.8 (L) 36.0 - 46.0 %   MCV 93.5 78.0 - 100.0 fL   MCH 29.5 26.0 - 34.0 pg   MCHC 31.6 30.0 - 36.0 g/dL   RDW 16.8 (H) 11.5 - 15.5 %   Platelets 222 150 - 400 K/uL   Neutrophils Relative % 74 43 - 77 %   Neutro Abs 5.2 1.7 - 7.7 K/uL   Lymphocytes Relative 12 12 - 46 %   Lymphs Abs 0.9 0.7 - 4.0 K/uL   Monocytes Relative 11 3 - 12 %   Monocytes Absolute 0.8 0.1 - 1.0 K/uL   Eosinophils Relative 3 0 - 5 %   Eosinophils Absolute 0.2 0.0 - 0.7 K/uL   Basophils Relative 0 0 - 1 %   Basophils Absolute 0.0 0.0 - 0.1 K/uL  Troponin I     Status: None   Collection Time: 03/12/15  9:56 PM  Result Value Ref Range    Troponin I 0.03 <0.031 ng/mL    Comment:        NO INDICATION OF MYOCARDIAL INJURY.   Comprehensive metabolic panel     Status: Abnormal   Collection Time: 03/12/15  9:56 PM  Result Value Ref Range   Sodium 136 135 - 145 mmol/L   Potassium 3.9 3.5 - 5.1 mmol/L   Chloride 93 (L) 101 - 111 mmol/L   CO2 33 (H) 22 - 32 mmol/L   Glucose, Bld 97 65 - 99 mg/dL   BUN 48 (H) 6 - 20 mg/dL   Creatinine, Ser 1.88 (H) 0.44 - 1.00 mg/dL   Calcium 8.5 (L) 8.9 - 10.3 mg/dL   Total Protein 6.8 6.5 - 8.1 g/dL   Albumin 3.2 (L) 3.5 - 5.0 g/dL   AST 27 15 - 41 U/L   ALT 27 14 - 54 U/L   Alkaline Phosphatase 142 (H) 38 - 126 U/L   Total Bilirubin 0.7 0.3 - 1.2 mg/dL   GFR calc non Af Amer 26 (L) >60 mL/min   GFR calc Af Amer 30 (L) >60 mL/min    Comment: (NOTE) The eGFR has been calculated using the CKD EPI equation. This calculation has not been validated in all clinical situations. eGFR's persistently <60 mL/min signify possible Chronic Kidney Disease.    Anion gap 10 5 - 15  Brain natriuretic peptide     Status: Abnormal   Collection Time: 03/12/15  9:56 PM  Result Value Ref Range   B Natriuretic Peptide 178.0 (H) 0.0 - 100.0 pg/mL   Dg Chest Port 1 View  03/12/2015   CLINICAL DATA:  Dyspnea  EXAM: PORTABLE CHEST - 1 VIEW  COMPARISON:  03/03/2015  FINDINGS: Bilateral airspace disease which is asymmetric to the left. Probable small pleural effusions.  Stable cardiopericardial enlargement in this patient post aortic valve replacement and left atrial exclusion. Blood flow is cephalized in the vascular pedicle is widened.  IMPRESSION: Bilateral airspace disease which could reflect pneumonia or asymmetric edema.   Electronically Signed   By: Monte Fantasia M.D.   On: 03/12/2015 21:51    Review of Systems  Constitutional: Negative.   HENT: Negative.   Eyes: Negative.   Respiratory: Positive for shortness of breath. Negative for cough, hemoptysis, sputum production and wheezing.   Cardiovascular:  Positive for orthopnea and leg swelling. Negative for chest pain, palpitations, claudication and PND.  Gastrointestinal: Negative for heartburn, nausea,  vomiting, abdominal pain, diarrhea, constipation, blood in stool and melena.  Genitourinary: Negative for dysuria, urgency, frequency, hematuria and flank pain.  Musculoskeletal: Negative for myalgias, back pain, joint pain, falls and neck pain.  Skin: Negative.   Neurological: Negative.   Endo/Heme/Allergies: Negative.   Psychiatric/Behavioral: Negative.     Blood pressure 131/60, pulse 70, temperature 98.1 F (36.7 C), temperature source Oral, resp. rate 21, height 5' 2"  (1.575 m), weight 105.688 kg (233 lb), SpO2 96 %. Physical Exam  Constitutional: She is oriented to person, place, and time. She appears well-developed and well-nourished.  HENT:  Head: Normocephalic and atraumatic.  Mouth/Throat: No oropharyngeal exudate.  Eyes: Conjunctivae and EOM are normal. Pupils are equal, round, and reactive to light. No scleral icterus.  Neck: Normal range of motion. Neck supple. JVD present. No tracheal deviation present. No thyromegaly present.  Cardiovascular: Normal rate and regular rhythm.  Exam reveals no gallop and no friction rub.   No murmur heard. Respiratory: Effort normal. No respiratory distress. She has no wheezes. She has rales. She exhibits no tenderness.  GI: Soft. Bowel sounds are normal. She exhibits no distension and no mass. There is no tenderness. There is no rebound and no guarding.  Musculoskeletal: Normal range of motion. She exhibits no edema or tenderness.  Lymphadenopathy:    She has no cervical adenopathy.  Neurological: She is alert and oriented to person, place, and time. She has normal reflexes. She displays normal reflexes. No cranial nerve deficit. She exhibits normal muscle tone. Coordination normal.  Skin: Skin is warm and dry. No rash noted. No erythema. No pallor.  Psychiatric: She has a normal mood and  affect. Her behavior is normal. Judgment and thought content normal.     Assessment/Plan Dyspnea ? CHF vs pneumonia tx with vanco, zosyn, levaquin pharmacy to dose Start on lasix 45m iv qday (Note her torsemide was recently increased) Trop i q6hx3 Check tsh Check cardiac echo  ARF Check urine sodium, urine creatinine , urine eosinophils Check urinalysis Check renal ultrasound  ANemia Check iron studies, b12, folate, tsh, sed rate, spep, upep hemeoccult stool  Pafib (Chads2=2) Cont coumadin, pharmacy to dose  Copd Stable  DVT prophylaxis:  Cont coumadin  Code Status:  FULL CODE KJani Gravel7/05/2015, 11:29 PM

## 2015-03-12 NOTE — ED Notes (Signed)
Having shortness of breath all day. History of COPD. Took 4 albuterol treatments today. Having trouble speaking in sentences. Placed on oxygen and that helped.

## 2015-03-12 NOTE — ED Provider Notes (Addendum)
CSN: [id number]     Arrival date & time 03/12/15  2042 History  This chart was scribed for [name], MD by Phillis Haggis, ED Scribe. This patient was seen in room APA04/APA04 and patient care was started at 9:00 PM.   Chief Complaint  Patient presents with  . Shortness of Breath   The history is provided by the patient. No language interpreter was used.  HPI Comments: Sandra Turner is a 74 y.o. Female with hx of CHF, bronchitis and pneumonia who presents to the Emergency Department complaining of SOB onset earlier today.Reports having open heart surgery, aortic valve replacement, 01/19/15. States that she has fluid in her lungs and was on a ventilator for 2 weeks. Reports getting winded when walking around the house within 5 steps before stopping with bilateral lower leg swelling and redness. She denies being on oxygen at home or chest pain. Pt states that she lives at home by herself and was doing rehab at [facility] two weeks ago.  Past Medical History  Diagnosis Date  . Arthritis   . Chronic atrial fibrillation     takes Pradaxa daily  . Aortic stenosis     Severe by echo August 2015  . Pneumonia 09/2014  . History of bronchitis 2015  . Joint pain   . Back pain     reason unknown  . Family history of adverse reaction to anesthesia     uncle with MH in the 60's,cousin in the 18's with MH  . 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 None    . Left and right heart catheterization with coronary angiogram N/A 11/01/2014    Procedure: LEFT AND RIGHT HEART CATHETERIZATION WITH CORONARY ANGIOGRAM;   Surgeon: [name], MD;  Location: [facility] CATH LAB;  Service: Cardiovascular;  Laterality: N/A;  . Aortic valve replacement N/A 01/19/2015    Procedure: AORTIC VALVE REPLACEMENT (AVR);  Surgeon: Purcell Nails, MD;  Location: [facility] OR;  Service: Open Heart Surgery;  Laterality: N/A;  . Maze N/A 01/19/2015    Procedure: MAZE;  Surgeon: Purcell Nails, MD;  Location: [facility] OR;  Service: Open Heart Surgery;  Laterality: N/A;  . Aortic root enlargement N/A 01/19/2015    Procedure:  AORTIC ROOT ENLARGEMENT;  Surgeon: Purcell Nails, MD;  Location: [facility] OR;  Service: Open Heart Surgery;  Laterality: N/A;  . Tee without cardioversion N/A 01/19/2015    Procedure: TRANSESOPHAGEAL ECHOCARDIOGRAM (TEE);  Surgeon: Purcell Nails, MD;  Location: [facility] OR;  Service: Open Heart Surgery;  Laterality: N/A;   Family History  Problem Relation Age of Onset  . Breast cancer Mother   . Congestive Heart Failure Mother   . Cancer Father     ? Lymphoma  . Malignant hyperthermia Cousin    History  Substance Use Topics  . Smoking status: Never Smoker   . Smokeless tobacco: Not on file  . Alcohol Use: No   OB History    No data available     Review of Systems A complete 10 system review of systems was obtained and all systems are negative except as  noted in the HPI and PMH.   Allergies  Bee venom; Latex; and Penicillins  Home Medications   Prior to Admission medications   Medication Sig Start Date End Date Taking? Authorizing Provider  albuterol (PROAIR HFA) 108 (90 BASE) MCG/ACT inhaler Inhale 2 puffs into the lungs 4 (four) times daily as needed for wheezing or shortness of breath.   Yes Historical Provider, MD  albuterol (PROVENTIL) (2.5 MG/3ML) 0.083% nebulizer solution Take 3 mLs (2.5 mg total) by nebulization every 4 (four) hours as needed for wheezing or shortness of breath. 09/15/14  Yes Erick Blinks, MD  amiodarone (PACERONE) 200 MG tablet Take 1 tablet (200 mg total) by mouth daily. 02/21/15  Yes  Wayne E Gold, PA-C  cephALEXin (KEFLEX) 500 MG capsule Take 500 mg by mouth 4 (four) times daily. 10 day course starting on 03/01/2015   Yes Historical Provider, MD  potassium chloride SA (K-DUR,KLOR-CON) 20 MEQ tablet Take 1 tablet (20 mEq total) by mouth 2 (two) times daily. 02/21/15  Yes Wayne E Gold, PA-C  torsemide (DEMADEX) 20 MG tablet Take 2 tablets (40 mg total) by mouth daily. Patient taking differently: Take 20 mg by mouth 2 (two) times daily.  03/03/15  Yes Jodelle Gross, NP  traMADol (ULTRAM) 50 MG tablet Take 1 tablet (50 mg total) by mouth every 6 (six) hours as needed (pain). 02/21/15  Yes Wayne E Gold, PA-C  warfarin (COUMADIN) 4 MG tablet Take 1 tablet (4 mg total) by mouth daily at 6 PM. As directed, bases on INR Patient taking differently: Take 4 mg by mouth daily at 6 PM. Takes one tablet (4mg  total) Mondays through Fridays. *Takes 5mg  on Saturdays and Sundays* 02/21/15  Yes Wayne E Gold, PA-C  warfarin (COUMADIN) 5 MG tablet Take 5 mg by mouth 2 (two) times a week. Takes 5mg  on Saturdays and Sundays (*Takes 4mg  on all other days)   Yes Historical Provider, MD  amLODipine (NORVASC) 10 MG tablet Take 1 tablet (10 mg total) by mouth daily. Patient not taking: Reported on 03/12/2015 02/21/15   Glenice Laine Gold, PA-C  budesonide (PULMICORT) 0.25 MG/2ML nebulizer solution Take 2 mLs (0.25 mg total) by nebulization every 6 (six) hours. Patient not taking: Reported on 03/12/2015 02/21/15   Rowe Clack, PA-C  chlorhexidine (PERIDEX) 0.12 % solution 15 mLs by Mouth Rinse route 2 (two) times daily. Patient not taking: Reported on 03/12/2015 02/21/15   Rowe Clack, PA-C  food thickener [facility] IT) POWD As needed Patient not taking: Reported on 03/12/2015 02/21/15   Rowe Clack, PA-C  predniSONE (DELTASONE) 10 MG tablet Take 1 tablet (10 mg total) by mouth daily with breakfast. Patient not taking: Reported on 03/12/2015 02/21/15   Deniece Portela E Gold, PA-C   BP 138/65 mmHg  Pulse 74  Temp(Src) 98.1 F (36.7  C) (Oral)  Resp 18  Ht 5\' 2"  (1.575 m)  Wt 233 lb (105.688 kg)  BMI 42.61 kg/m2  SpO2 94%   Physical Exam  Constitutional: She is oriented to person, place, and time. She appears well-developed and well-nourished.  obese  HENT:  Head: Normocephalic and atraumatic.  Eyes: Conjunctivae and EOM are normal. Pupils are equal, round, and reactive to light.  Neck: Normal range of motion. Neck supple.  Cardiovascular: Normal rate and regular rhythm.   Pulmonary/Chest: Effort normal and breath sounds normal. Tachypnea noted.  Dyspneic and tachypnic, not moving air well  Abdominal: Soft. Bowel sounds are normal.  Musculoskeletal: Normal range of motion.  Neurological: She is alert and oriented to person, place, and time.  Skin: Skin is warm and dry.  4+ peripheral edema with associated lower extremity erythema  Psychiatric: She has a normal mood and affect. Her behavior is normal.  Nursing note and vitals reviewed.   ED Course  Procedures (including critical care time) DIAGNOSTIC STUDIES: Oxygen Saturation is 94% on RA, adequate by my interpretation.    COORDINATION OF CARE: 9:03 PM-Discussed treatment plan which includes possible admission to hospital and monitoring with pt at bedside and pt agreed to plan.   Labs Review Labs Reviewed  CBC WITH DIFFERENTIAL/PLATELET - Abnormal; Notable for the following:    RBC 3.08 (*)    Hemoglobin 9.1 (*)    HCT 28.8 (*)    RDW 16.8 (*)    All other components within normal limits  COMPREHENSIVE METABOLIC PANEL - Abnormal; Notable for the following:    Chloride 93 (*)    CO2 33 (*)    BUN 48 (*)    Creatinine, Ser 1.88 (*)    Calcium 8.5 (*)    Albumin 3.2 (*)    Alkaline Phosphatase 142 (*)    GFR calc non Af Amer 26 (*)    GFR calc Af Amer 30 (*)    All other components within normal limits  TROPONIN I  BRAIN NATRIURETIC PEPTIDE    Imaging Review Dg Chest Port 1 View  03/12/2015   CLINICAL DATA:  Dyspnea  EXAM: PORTABLE CHEST -  1 VIEW  COMPARISON:  03/03/2015  FINDINGS: Bilateral airspace disease which is asymmetric to the left. Probable small pleural effusions.  Stable cardiopericardial enlargement in this patient post aortic valve replacement and left atrial exclusion. Blood flow is cephalized in the vascular pedicle is widened.  IMPRESSION: Bilateral airspace disease which could reflect pneumonia or asymmetric edema.   Electronically Signed   By: Marnee SpringJonathon  Watts M.D.   On: 03/12/2015 21:51     EKG Interpretation   Date/Time:  Saturday March 12 2015 20:56:35 EDT Ventricular Rate:  66 PR Interval:  63 QRS Duration: 100 QT Interval:  398 QTC Calculation: 417 R Axis:   -118 Text Interpretation:  Sinus rhythm Atrial premature complex Short PR  interval Left anterior fascicular block Anterolateral infarct, old  Confirmed by Anneke Cundy  MD, Rivan Siordia ([id number]) on 03/12/2015 10:40:18 PM      MDM   Final diagnoses:  Acute congestive heart failure, unspecified congestive heart failure type   Patient is dyspneic. Chest x-ray suggests congestive heart failure. IV Lasix. Admit to general medicine  I personally performed the services described in this documentation, which was scribed in my presence. The recorded information has been reviewed and is accurate.    Donnetta HutchingBrian Elliona Doddridge, MD 03/12/15 2251  Donnetta HutchingBrian Cheyeanne Roadcap, MD 03/13/15 [phone]

## 2015-03-13 ENCOUNTER — Inpatient Hospital Stay (HOSPITAL_COMMUNITY): Payer: Medicare Other

## 2015-03-13 DIAGNOSIS — J189 Pneumonia, unspecified organism: Secondary | ICD-10-CM

## 2015-03-13 DIAGNOSIS — R0602 Shortness of breath: Secondary | ICD-10-CM

## 2015-03-13 DIAGNOSIS — I5031 Acute diastolic (congestive) heart failure: Secondary | ICD-10-CM

## 2015-03-13 LAB — TROPONIN I
Troponin I: <0.03 ng/mL (ref <0.031)
Troponin I: <0.03 ng/mL (ref <0.031)
Troponin I: <0.03 ng/mL (ref <0.031)

## 2015-03-13 LAB — SODIUM, URINE, RANDOM: Sodium, Ur: 10 mmol/L

## 2015-03-13 LAB — COMPREHENSIVE METABOLIC PANEL
ALT: 25 U/L (ref 14–54)
AST: 23 U/L (ref 15–41)
Albumin: 2.9 g/dL — ABNORMAL LOW (ref 3.5–5.0)
Alkaline Phosphatase: 129 U/L — ABNORMAL HIGH (ref 38–126)
Anion gap: 10 (ref 5–15)
BUN: 45 mg/dL — ABNORMAL HIGH (ref 6–20)
CALCIUM: 8.4 mg/dL — AB (ref 8.9–10.3)
CO2: 31 mmol/L (ref 22–32)
Chloride: 94 mmol/L — ABNORMAL LOW (ref 101–111)
Creatinine, Ser: 1.7 mg/dL — ABNORMAL HIGH (ref 0.44–1.00)
GFR, EST AFRICAN AMERICAN: 34 mL/min — AB (ref >60)
GFR, EST NON AFRICAN AMERICAN: 29 mL/min — AB (ref >60)
GLUCOSE: 87 mg/dL (ref 65–99)
POTASSIUM: 4 mmol/L (ref 3.5–5.1)
Sodium: 135 mmol/L (ref 135–145)
Total Bilirubin: 1.2 mg/dL (ref 0.3–1.2)
Total Protein: 6.2 g/dL — ABNORMAL LOW (ref 6.5–8.1)

## 2015-03-13 LAB — PROTIME-INR
INR: 4.03 — ABNORMAL HIGH (ref 0.00–1.49)
Prothrombin Time: 38.2 seconds — ABNORMAL HIGH (ref 11.6–15.2)

## 2015-03-13 LAB — FERRITIN: FERRITIN: 195 ng/mL (ref 11–307)

## 2015-03-13 LAB — CBC
HEMATOCRIT: 28.5 % — AB (ref 36.0–46.0)
HEMOGLOBIN: 9 g/dL — AB (ref 12.0–15.0)
MCH: 29.6 pg (ref 26.0–34.0)
MCHC: 31.6 g/dL (ref 30.0–36.0)
MCV: 93.8 fL (ref 78.0–100.0)
Platelets: 217 10*3/uL (ref 150–400)
RBC: 3.04 MIL/uL — ABNORMAL LOW (ref 3.87–5.11)
RDW: 16.7 % — ABNORMAL HIGH (ref 11.5–15.5)
WBC: 6.5 10*3/uL (ref 4.0–10.5)

## 2015-03-13 LAB — SEDIMENTATION RATE: SED RATE: 50 mm/h — AB (ref 0–22)

## 2015-03-13 LAB — IRON AND TIBC
Iron: 34 ug/dL (ref 28–170)
Saturation Ratios: 12 % (ref 10.4–31.8)
TIBC: 279 ug/dL (ref 250–450)
UIBC: 245 ug/dL

## 2015-03-13 LAB — VITAMIN B12: Vitamin B-12: 1362 pg/mL — ABNORMAL HIGH (ref 180–914)

## 2015-03-13 LAB — TSH: TSH: 4.069 u[IU]/mL (ref 0.350–4.500)

## 2015-03-13 MED ORDER — LEVOFLOXACIN IN D5W 750 MG/150ML IV SOLN: 750.0000 mg | INTRAVENOUS | Status: DC

## 2015-03-13 MED ORDER — WARFARIN - PHARMACIST DOSING INPATIENT
Status: DC
  Administered 2015-03-16: 16:00:00
  Administered 2015-03-18: 1
  Administered 2015-03-19: 16:00:00

## 2015-03-13 MED ORDER — SODIUM CHLORIDE 0.9 % IJ SOLN: 3.0000 mL | INTRAMUSCULAR | Status: DC | PRN

## 2015-03-13 MED ORDER — AMIODARONE HCL 200 MG PO TABS
200.0000 mg | ORAL_TABLET | Freq: Every day | ORAL | Status: DC
  Administered 2015-03-13 – 2015-03-17 (×5): 200 mg via ORAL
  Filled 2015-03-13 (×5): qty 1

## 2015-03-13 MED ORDER — CETYLPYRIDINIUM CHLORIDE 0.05 % MT LIQD
7.0000 mL | Freq: Two times a day (BID) | OROMUCOSAL | Status: DC
  Administered 2015-03-13 – 2015-03-20 (×16): 7 mL via OROMUCOSAL

## 2015-03-13 MED ORDER — PIPERACILLIN-TAZOBACTAM 3.375 G IVPB
3.3750 g | Freq: Three times a day (TID) | INTRAVENOUS | Status: DC
  Administered 2015-03-13 – 2015-03-17 (×14): 3.375 g via INTRAVENOUS
  Filled 2015-03-13 (×17): qty 50

## 2015-03-13 MED ORDER — ALBUTEROL SULFATE HFA 108 (90 BASE) MCG/ACT IN AERS: 2.0000 | INHALATION_SPRAY | Freq: Four times a day (QID) | RESPIRATORY_TRACT | Status: DC | PRN

## 2015-03-13 MED ORDER — SODIUM CHLORIDE 0.9 % IV SOLN
250.0000 mL | INTRAVENOUS | Status: DC | PRN
  Administered 2015-03-13: 250 mL via INTRAVENOUS

## 2015-03-13 MED ORDER — SODIUM CHLORIDE 0.9 % IJ SOLN
3.0000 mL | Freq: Two times a day (BID) | INTRAMUSCULAR | Status: DC
  Administered 2015-03-13 – 2015-03-18 (×7): 3 mL via INTRAVENOUS

## 2015-03-13 MED ORDER — ACETAMINOPHEN 650 MG RE SUPP: 650.0000 mg | Freq: Four times a day (QID) | RECTAL | Status: DC | PRN

## 2015-03-13 MED ORDER — BUMETANIDE 0.25 MG/ML IJ SOLN
1.0000 mg | Freq: Two times a day (BID) | INTRAMUSCULAR | Status: DC
  Administered 2015-03-13 – 2015-03-17 (×9): 1 mg via INTRAVENOUS
  Filled 2015-03-13 (×9): qty 4

## 2015-03-13 MED ORDER — TRAMADOL HCL 50 MG PO TABS: 50.0000 mg | ORAL_TABLET | Freq: Four times a day (QID) | ORAL | Status: DC | PRN

## 2015-03-13 MED ORDER — VANCOMYCIN HCL 10 G IV SOLR
2500.0000 mg | Freq: Once | INTRAVENOUS | Status: AC
  Administered 2015-03-13: 2500 mg via INTRAVENOUS
  Filled 2015-03-13: qty 2500

## 2015-03-13 MED ORDER — FUROSEMIDE 10 MG/ML IJ SOLN
40.0000 mg | Freq: Two times a day (BID) | INTRAMUSCULAR | Status: DC
  Administered 2015-03-13: 40 mg via INTRAVENOUS
  Filled 2015-03-13: qty 4

## 2015-03-13 MED ORDER — ALBUTEROL SULFATE (2.5 MG/3ML) 0.083% IN NEBU
2.5000 mg | INHALATION_SOLUTION | RESPIRATORY_TRACT | Status: DC | PRN
  Administered 2015-03-13 – 2015-03-17 (×4): 2.5 mg via RESPIRATORY_TRACT
  Filled 2015-03-13 (×4): qty 3

## 2015-03-13 MED ORDER — PIPERACILLIN-TAZOBACTAM 3.375 G IVPB: 3.3750 g | Freq: Once | INTRAVENOUS | Status: DC

## 2015-03-13 MED ORDER — SODIUM CHLORIDE 0.9 % IJ SOLN
3.0000 mL | Freq: Two times a day (BID) | INTRAMUSCULAR | Status: DC
  Administered 2015-03-13 – 2015-03-15 (×3): 3 mL via INTRAVENOUS

## 2015-03-13 MED ORDER — VANCOMYCIN HCL 10 G IV SOLR
1250.0000 mg | INTRAVENOUS | Status: DC
  Administered 2015-03-14 – 2015-03-15 (×2): 1250 mg via INTRAVENOUS
  Filled 2015-03-13 (×3): qty 1250

## 2015-03-13 MED ORDER — POTASSIUM CHLORIDE CRYS ER 20 MEQ PO TBCR
20.0000 meq | EXTENDED_RELEASE_TABLET | Freq: Two times a day (BID) | ORAL | Status: DC
  Administered 2015-03-13 – 2015-03-18 (×12): 20 meq via ORAL
  Filled 2015-03-13 (×12): qty 1

## 2015-03-13 MED ORDER — PIPERACILLIN-TAZOBACTAM 3.375 G IVPB
INTRAVENOUS | Status: AC
  Filled 2015-03-13: qty 50

## 2015-03-13 MED ORDER — VANCOMYCIN HCL IN DEXTROSE 1-5 GM/200ML-% IV SOLN
INTRAVENOUS | Status: AC
  Filled 2015-03-13: qty 200

## 2015-03-13 MED ORDER — LEVOFLOXACIN IN D5W 750 MG/150ML IV SOLN
750.0000 mg | Freq: Once | INTRAVENOUS | Status: AC
  Administered 2015-03-13: 750 mg via INTRAVENOUS
  Filled 2015-03-13: qty 150

## 2015-03-13 MED ORDER — ACETAMINOPHEN 325 MG PO TABS: 650.0000 mg | ORAL_TABLET | Freq: Four times a day (QID) | ORAL | Status: DC | PRN

## 2015-03-13 NOTE — Progress Notes (Addendum)
ANTIBIOTIC CONSULT NOTE-Preliminary  Pharmacy Consult for levaquin, vancomycin, Zosyn Indication: pneumonia  Allergies  Allergen Reactions  . Bee Venom Swelling  . Latex Rash  . Penicillins Rash    Patient Measurements: Height: 5\' 2"  (157.5 cm) Weight: 233 lb (105.688 kg) IBW/kg (Calculated) : 50.1   Vital Signs: Temp: 98.1 F (36.7 C) (07/09 2218) Temp Source: Oral (07/09 2218) BP: 131/60 mmHg (07/09 2300) Pulse Rate: 70 (07/09 2300)  Labs:  Recent Labs  03/12/15 2156  WBC 7.0  HGB 9.1*  PLT 222  CREATININE 1.88*    Estimated Creatinine Clearance: 30.9 mL/min (by C-G formula based on Cr of 1.88).  No results for input(s): VANCOTROUGH, VANCOPEAK, VANCORANDOM, GENTTROUGH, GENTPEAK, GENTRANDOM, TOBRATROUGH, TOBRAPEAK, TOBRARND, AMIKACINPEAK, AMIKACINTROU, AMIKACIN in the last 72 hours.   Microbiology: Recent Results (from the past 720 hour(s))  Clostridium Difficile by PCR (not at [facility])     Status: None   Collection Time: 02/14/15 12:15 PM  Result Value Ref Range Status   C difficile by pcr NEGATIVE NEGATIVE Final    Medical History: Past Medical History  Diagnosis Date  . Arthritis   . Chronic atrial fibrillation     takes Pradaxa daily  . Aortic stenosis     Severe by echo August 2015  . Pneumonia 09/2014  . History of bronchitis 2015  . Joint pain   . Back pain     reason unknown  . Family history of adverse reaction to anesthesia     uncle with MH in the 60's,cousin in the 7270's with MH  . 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 COPD (chronic obstructive pulmonary disease)     Medications:  Scheduled:     Assessment: 74 yo female admitted for dyspnea, workup for CHF vs pneumonia.  Goal of Therapy:  Vancomycin trough level 15-20 mcg/ml  Plan:  Preliminary review of pertinent patient information completed.  Protocol will be initiated with a one-time dose(s) of levaquin 750 mg IV x 1, vancomycin loading dose of 2500mg , and Zosyn 3.375 grams Q 8 hours.  Jeani HawkingAnnie Penn clinical pharmacist will complete review during morning rounds to assess patient and finalize treatment regimen.  Ivey Nembhard Scarlett, RPH 03/13/2015,12:11 AM

## 2015-03-13 NOTE — ED Provider Notes (Signed)
CRITICAL CARE Performed by: Donnetta HutchingOOK,Nusaybah Ivie Total critical care time: 30 Critical care time was exclusive of separately billable procedures and treating other patients. Critical care was necessary to treat or prevent imminent or life-threatening deterioration. Critical care was time spent personally by me on the following activities: development of treatment plan with patient and/or surrogate as well as nursing, discussions with consultants, evaluation of patient's response to treatment, examination of patient, obtaining history from patient or surrogate, ordering and performing treatments and interventions, ordering and review of laboratory studies, ordering and review of radiographic studies, pulse oximetry and re-evaluation of patient's condition.  Donnetta HutchingBrian Reyna Lorenzi, MD 03/13/15 [phone]

## 2015-03-13 NOTE — Progress Notes (Signed)
[facility] PROGRESS NOTE  Camrie Stock WJX:[id number] DOB: Aug 08, 1941    PCP:   [name], MD   HPI: Sandra Turner is an 74 y.o. 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  She has been having orthopnea, pedal edema, and DOE for several months.  No fever, chills, coughs, or leukocytosis.  She has slight AKI with Cr of 1.8.  She has mild anemia, with Hb of 9, and has no clinical evidence of GI Bleed.   Rewiew of Systems:  Constitutional: Negative for malaise, fever and chills. No significant weight loss or weight gain Eyes: Negative for eye pain, redness and discharge, diplopia, visual changes, or flashes of light. ENMT: Negative for ear pain, hoarseness, nasal congestion, sinus pressure and sore throat. No headaches; tinnitus, drooling, or problem swallowing. Cardiovascular: Negative for chest pain, palpitations, diaphoresis, dyspnea and peripheral edema. ; No orthopnea, PND Respiratory: Negative for cough, hemoptysis, wheezing and stridor. No pleuritic chestpain. Gastrointestinal: Negative for nausea, vomiting, diarrhea, constipation, abdominal pain, melena, blood in stool, hematemesis, jaundice and rectal bleeding.    Genitourinary: Negative for frequency, dysuria, incontinence,flank pain and hematuria; Musculoskeletal: Negative for back pain and neck pain. Negative for swelling and trauma.;  Skin: . Negative for pruritus, rash, abrasions, bruising and skin lesion.; ulcerations Neuro: Negative for headache, lightheadedness and neck stiffness. Negative for weakness, altered level of consciousness , altered mental status, extremity weakness, burning feet, involuntary movement, seizure and syncope.  Psych: negative for anxiety, depression, insomnia, tearfulness, panic attacks, hallucinations, paranoia,  suicidal or homicidal ideation    Past Medical History  Diagnosis Date  . Arthritis   . Chronic atrial fibrillation     takes Pradaxa daily  . Aortic stenosis     Severe by echo August 2015  . Pneumonia 09/2014  . History of bronchitis 2015  . Joint pain   . Back pain     reason unknown  . Family history of adverse reaction to anesthesia     uncle with MH in the 60's,cousin in the 69's with MH  . 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 COPD (chronic obstructive pulmonary disease)     Past Surgical History  Procedure Laterality Date  . None    . Left and right heart catheterization with coronary angiogram N/A 11/01/2014    Procedure: LEFT AND RIGHT HEART CATHETERIZATION WITH CORONARY ANGIOGRAM;  Surgeon: [name], MD;  Location: [facility] CATH LAB;  Service: Cardiovascular;  Laterality: N/A;  . Aortic valve replacement N/A 01/19/2015    Procedure: AORTIC VALVE REPLACEMENT (AVR);  Surgeon: Purcell Nails, MD;  Location: [facility] OR;  Service: Open Heart Surgery;  Laterality: N/A;  . Maze N/A 01/19/2015    Procedure: MAZE;  Surgeon: Purcell Nails, MD;  Location: [facility] OR;  Service: Open Heart Surgery;  Laterality: N/A;  . Aortic root enlargement N/A 01/19/2015    Procedure:  AORTIC ROOT ENLARGEMENT;  Surgeon: Purcell Nails, MD;  Location: [facility] OR;  Service: Open Heart Surgery;  Laterality: N/A;  .  Tee without cardioversion N/A 01/19/2015    Procedure: TRANSESOPHAGEAL ECHOCARDIOGRAM (TEE);  Surgeon: Purcell Nails, MD;  Location: [facility] OR;  Service: Open Heart Surgery;  Laterality: N/A;    Medications:  HOME MEDS: Prior to Admission medications    Medication Sig Start Date End Date Taking? Authorizing Provider  albuterol (PROAIR HFA) 108 (90 BASE) MCG/ACT inhaler Inhale 2 puffs into the lungs 4 (four) times daily as needed for wheezing or shortness of breath.   Yes Historical Provider, MD  albuterol (PROVENTIL) (2.5 MG/3ML) 0.083% nebulizer solution Take 3 mLs (2.5 mg total) by nebulization every 4 (four) hours as needed for wheezing or shortness of breath. 09/15/14  Yes Erick Blinks, MD  amiodarone (PACERONE) 200 MG tablet Take 1 tablet (200 mg total) by mouth daily. 02/21/15  Yes Wayne E Gold, PA-C  cephALEXin (KEFLEX) 500 MG capsule Take 500 mg by mouth 4 (four) times daily. 10 day course starting on 03/01/2015   Yes Historical Provider, MD  potassium chloride SA (K-DUR,KLOR-CON) 20 MEQ tablet Take 1 tablet (20 mEq total) by mouth 2 (two) times daily. 02/21/15  Yes Wayne E Gold, PA-C  torsemide (DEMADEX) 20 MG tablet Take 2 tablets (40 mg total) by mouth daily. Patient taking differently: Take 20 mg by mouth 2 (two) times daily.  03/03/15  Yes Jodelle Gross, NP  traMADol (ULTRAM) 50 MG tablet Take 1 tablet (50 mg total) by mouth every 6 (six) hours as needed (pain). 02/21/15  Yes Wayne E Gold, PA-C  warfarin (COUMADIN) 4 MG tablet Take 1 tablet (4 mg total) by mouth daily at 6 PM. As directed, bases on INR Patient taking differently: Take 4 mg by mouth daily at 6 PM. Takes one tablet (4mg  total) Mondays through Fridays. *Takes 5mg  on Saturdays and Sundays* 02/21/15  Yes Wayne E Gold, PA-C  warfarin (COUMADIN) 5 MG tablet Take 5 mg by mouth 2 (two) times a week. Takes 5mg  on Saturdays and Sundays (*Takes 4mg  on all other days)   Yes Historical Provider, MD  amLODipine (NORVASC) 10 MG tablet Take 1 tablet (10 mg total) by mouth daily. Patient not taking: Reported on 03/12/2015 02/21/15   Glenice Laine Gold, PA-C  budesonide (PULMICORT) 0.25 MG/2ML nebulizer solution Take 2 mLs (0.25 mg total) by nebulization every 6 (six) hours. Patient not taking:  Reported on 03/12/2015 02/21/15   Rowe Clack, PA-C  chlorhexidine (PERIDEX) 0.12 % solution 15 mLs by Mouth Rinse route 2 (two) times daily. Patient not taking: Reported on 03/12/2015 02/21/15   Rowe Clack, PA-C  food thickener [facility] IT) POWD As needed Patient not taking: Reported on 03/12/2015 02/21/15   Rowe Clack, PA-C  predniSONE (DELTASONE) 10 MG tablet Take 1 tablet (10 mg total) by mouth daily with breakfast. Patient not taking: Reported on 03/12/2015 02/21/15   Rowe Clack, PA-C     Allergies:  Allergies  Allergen Reactions  . Bee Venom Swelling  . Latex Rash  . Penicillins Rash    Social History:   reports that she has never smoked. She does not have any smokeless tobacco history on file. She reports that she does not drink alcohol or use illicit drugs.  Family History: Family History  Problem Relation Age of Onset  . Breast cancer Mother   . Congestive Heart Failure Mother   . Cancer Father     ? Lymphoma  . Malignant hyperthermia Cousin      Physical Exam: Filed Vitals:   03/13/15 0455  03/13/15 0500 03/13/15 1113 03/13/15 1345  BP: 130/52   111/52  Pulse: 65   67  Temp: 98.6 F (37 C)   97.8 F (36.6 C)  TempSrc: Oral   Oral  Resp: 24   20  Height:      Weight:  107.7 kg (237 lb 7 oz)    SpO2: 98%  96% 98%   Blood pressure 111/52, pulse 67, temperature 97.8 F (36.6 C), temperature source Oral, resp. rate 20, height  (1.575 m), weight 107.7 kg (237 lb 7 oz), SpO2 98 %.  GEN:  Pleasant  patient lying in the stretcher in no acute distress; cooperative with exam. PSYCH:  alert and oriented x4; does not appear anxious or depressed; affect is appropriate. HEENT: Mucous membranes pink and anicteric; PERRLA; EOM intact; no cervical lymphadenopathy nor thyromegaly or carotid bruit; no JVD; There were no stridor. Neck is very supple. Breasts:: Not examined CHEST WALL: No tenderness CHEST: Normal respiration, Bilateral rales, no wheezing.  HEART: Regular rate  and rhythm.  There are no murmur, rub, or gallops.   BACK: No kyphosis or scoliosis; no CVA tenderness ABDOMEN: soft and non-tender; no masses, no organomegaly, normal abdominal bowel sounds; no pannus; no intertriginous candida. There is no rebound and no distention. Rectal Exam: Not done EXTREMITIES: No bone or joint deformity; age-appropriate arthropathy of the hands and knees; 2+ edema; no ulcerations.  There is no calf tenderness. Genitalia: not examined PULSES: 2+ and symmetric SKIN: Normal hydration no rash or ulceration CNS: Cranial nerves 2-12 grossly intact no focal lateralizing neurologic deficit.  Speech is fluent; uvula elevated with phonation, facial symmetry and tongue midline. DTR are normal bilaterally, cerebella exam is intact, barbinski is negative and strengths are equaled bilaterally.  No sensory loss.   Labs on Admission:  Basic Metabolic Panel:  Recent Labs Lab 03/12/15 2156 03/13/15 0615  NA 136 135  K 3.9 4.0  CL 93* 94*  CO2 33* 31  GLUCOSE 97 87  BUN 48* 45*  CREATININE 1.88* 1.70*  CALCIUM 8.5* 8.4*   Liver Function Tests:  Recent Labs Lab 03/12/15 2156 03/13/15 0615  AST 27 23  ALT 27 25  ALKPHOS 142* 129*  BILITOT 0.7 1.2  PROT 6.8 6.2*  ALBUMIN 3.2* 2.9*   CBC:  Recent Labs Lab 03/12/15 2156 03/13/15 0615  WBC 7.0 6.5  NEUTROABS 5.2  --   HGB 9.1* 9.0*  HCT 28.8* 28.5*  MCV 93.5 93.8  PLT 222 217   Cardiac Enzymes:  Recent Labs Lab 03/12/15 2156 03/13/15 0131 03/13/15 0615 03/13/15 1209  TROPONINI 0.03 <0.03 <0.03 <0.03    CBG: No results for input(s): GLUCAP in the last 168 hours.   Radiological Exams on Admission: US Renal  03/13/2015   CLINICAL DATA:  74 year old female with acute renal failure.  EXAM: RENAL / URINARY TRACT ULTRASOUND COMPLETE  COMPARISON:  None.  FINDINGS: Right Kidney:  Length: 10.6 cm. Echogenicity is upper limits of normal. No mass or hydronephrosis visualized.  Left Kidney:  Length: 10.5 cm.  Echogenicity is upper limits of normal. No mass or hydronephrosis visualized.  Bladder:  A Foley catheter is identified and a collapsed bladder.  IMPRESSION: Upper limits of normal renal echogenicity may represent medical renal disease.  No evidence of hydronephrosis.   Electronically Signed   By: Harmon Pier M.D.   On: 03/13/2015 12:37   Dg Chest Port 1 View  03/12/2015   CLINICAL DATA:  Dyspnea  EXAM: PORTABLE CHEST -  1 VIEW  COMPARISON:  03/03/2015  FINDINGS: Bilateral airspace disease which is asymmetric to the left. Probable small pleural effusions.  Stable cardiopericardial enlargement in this patient post aortic valve replacement and left atrial exclusion. Blood flow is cephalized in the vascular pedicle is widened.  IMPRESSION: Bilateral airspace disease which could reflect pneumonia or asymmetric edema.   Electronically Signed   By: Marnee SpringJonathon  Watts M.D.   On: 03/12/2015 21:51    EKG: Independently reviewed.    Assessment/Plan Present on Admission:  . Shortness of breath . Dyspnea PAF ANEMIA PNA CHF S/P AVR   PLAN: Will continue with IV Van/Zosyn.  Will d/c Levoquin.  I will change lasix to Bumex.  I think she is volume overloaded.  For her AKI, will follow closely. She has anemia, but no evidence of bleeding.  Will follow .  Other plans as per orders.  Code Status: FULL Unk LightningODE.    Kassidie Hendriks, MD. [facility] Pager [phone] 7pm to 7am.  03/13/2015, 2:40 PM

## 2015-03-13 NOTE — Progress Notes (Signed)
ANTICOAGULATION CONSULT NOTE - Initial Consult  Pharmacy Consult for Coumadin Indication: atrial fibrillation  Allergies  Allergen Reactions  . Bee Venom Swelling  . Latex Rash  . Penicillins Rash    Patient Measurements: Height: 5\' 2"  (157.5 cm) Weight: 237 lb 7 oz (107.7 kg) IBW/kg (Calculated) : 50.1 Heparin Dosing Weight:   Vital Signs: Temp: 98.6 F (37 C) (07/10 0455) Temp Source: Oral (07/10 0455) BP: 130/52 mmHg (07/10 0455) Pulse Rate: 65 (07/10 0455)  Labs:  Recent Labs  03/12/15 2156 03/13/15 0131 03/13/15 0615  HGB 9.1*  --  9.0*  HCT 28.8*  --  28.5*  PLT 222  --  217  LABPROT  --  38.2*  --   INR  --  4.03*  --   CREATININE 1.88*  --  1.70*  TROPONINI 0.03 <0.03 <0.03    Estimated Creatinine Clearance: 34.5 mL/min (by C-G formula based on Cr of 1.7).   Medical History: Past Medical History  Diagnosis Date  . Arthritis   . Chronic atrial fibrillation     takes Pradaxa daily  . Aortic stenosis     Severe by echo August 2015  . Pneumonia 09/2014  . History of bronchitis 2015  . Joint pain   . Back pain     reason unknown  . Family history of adverse reaction to anesthesia     uncle with MH in the 60's,cousin in the 5770's with MH  . 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 COPD (chronic obstructive pulmonary disease)     Medications:  Prescriptions prior to admission  Medication Sig Dispense Refill Last Dose  . albuterol (PROAIR HFA) 108 (90 BASE) MCG/ACT inhaler Inhale 2 puffs into the lungs 4 (four) times daily as needed for wheezing or shortness of breath.   03/12/2015 at Unknown time  . albuterol  (PROVENTIL) (2.5 MG/3ML) 0.083% nebulizer solution Take 3 mLs (2.5 mg total) by nebulization every 4 (four) hours as needed for wheezing or shortness of breath. 75 mL 12 03/12/2015 at 1900  . amiodarone (PACERONE) 200 MG tablet Take 1 tablet (200 mg total) by mouth daily.   03/12/2015 at Unknown time  . cephALEXin (KEFLEX) 500 MG capsule Take 500 mg by mouth 4 (four) times daily. 10 day course starting on 03/01/2015   03/11/2015 at Unknown time  . potassium chloride SA (K-DUR,KLOR-CON) 20 MEQ tablet Take 1 tablet (20 mEq total) by mouth 2 (two) times daily.   03/12/2015 at Unknown time  . torsemide (DEMADEX) 20 MG tablet Take 2 tablets (40 mg total) by mouth daily. (Patient taking differently: Take 20 mg by mouth 2 (two) times daily. ) 90 tablet 3 03/12/2015 at Unknown time  . traMADol (ULTRAM) 50 MG tablet Take 1 tablet (50 mg total) by mouth every 6 (six) hours as needed (pain). 50 tablet 0 03/12/2015 at Unknown time  . warfarin (COUMADIN) 4 MG tablet Take 1 tablet (4 mg total) by mouth daily at 6 PM. As directed, bases on INR (Patient taking differently: Take 4 mg by mouth daily at 6 PM. Takes one tablet (4mg  total) Mondays through Fridays. *Takes 5mg  on Saturdays and Sundays*)   03/11/2015  at 1100a  . warfarin (COUMADIN) 5 MG tablet Take 5 mg by mouth 2 (two) times a week. Takes  on Saturdays and Sundays (*Takes  on all other days)   03/12/2015 at 1100  . amLODipine (NORVASC) 10 MG tablet Take 1 tablet (10 mg total) by mouth daily. (Patient not taking: Reported on 03/12/2015) 30 tablet  Taking  . budesonide (PULMICORT) 0.25 MG/2ML nebulizer solution Take 2 mLs (0.25 mg total) by nebulization every 6 (six) hours. (Patient not taking: Reported on 03/12/2015) 60 mL 12  at 1900  . chlorhexidine (PERIDEX) 0.12 % solution 15 mLs by Mouth Rinse route 2 (two) times daily. (Patient not taking: Reported on 03/12/2015) 120 mL 0 Taking  . food thickener (THICK IT) POWD As needed (Patient not taking: Reported on 03/12/2015)  0  Taking  . predniSONE (DELTASONE) 10 MG tablet Take 1 tablet (10 mg total) by mouth daily with breakfast. (Patient not taking: Reported on 03/12/2015)   Taking    Assessment: Continuation of Coumadin PTA Labs reviewed INR elevated on admission  Goal of Therapy:  INR 2-3 Monitor platelets by anticoagulation protocol: Yes   Plan:  No Coumadin today due to elevated INR INR/PT daily Monitor CBC/platelets  Sandra Turner, Sandra Turner 03/13/2015,8:26 AM

## 2015-03-13 NOTE — Progress Notes (Signed)
ANTIBIOTIC CONSULT NOTE- Follow up  Pharmacy Consult for levaquin, vancomycin, Zosyn Indication: pneumonia  Allergies  Allergen Reactions  . Bee Venom Swelling  . Latex Rash  . Penicillins Rash    Patient Measurements: Height: 5\' 2"  (157.5 cm) Weight: 237 lb 7 oz (107.7 kg) IBW/kg (Calculated) : 50.1   Vital Signs: Temp: 98.6 F (37 C) (07/10 0455) Temp Source: Oral (07/10 0455) BP: 130/52 mmHg (07/10 0455) Pulse Rate: 65 (07/10 0455)  Labs:  Recent Labs  03/12/15 2156 03/13/15 0615  WBC 7.0 6.5  HGB 9.1* 9.0*  PLT 222 217  CREATININE 1.88* 1.70*    Estimated Creatinine Clearance: 34.5 mL/min (by C-G formula based on Cr of 1.7).  No results for input(s): VANCOTROUGH, VANCOPEAK, VANCORANDOM, GENTTROUGH, GENTPEAK, GENTRANDOM, TOBRATROUGH, TOBRAPEAK, TOBRARND, AMIKACINPEAK, AMIKACINTROU, AMIKACIN in the last 72 hours.   Microbiology: Recent Results (from the past 720 hour(s))  Clostridium Difficile by PCR (not at [facility])     Status: None   Collection Time: 02/14/15 12:15 PM  Result Value Ref Range Status   C difficile by pcr NEGATIVE NEGATIVE Final  Culture, blood (routine x 2)     Status: None (Preliminary result)   Collection Time: 03/13/15  1:31 AM  Result Value Ref Range Status   Specimen Description BLOOD RIGHT HAND  Final   Special Requests BOTTLES DRAWN AEROBIC ONLY 7CC  Final   Culture PENDING  Incomplete   Report Status PENDING  Incomplete  Culture, blood (routine x 2)     Status: None (Preliminary result)   Collection Time: 03/13/15  1:49 AM  Result Value Ref Range Status   Specimen Description LEFT ANTECUBITAL  Final   Special Requests BOTTLES DRAWN AEROBIC ONLY 7CC  Final   Culture PENDING  Incomplete   Report Status PENDING  Incomplete    Medical History: Past Medical History  Diagnosis Date  . Arthritis   . Chronic atrial fibrillation     takes Pradaxa daily  . Aortic stenosis     Severe by echo August 2015  . Pneumonia 09/2014  .  History of bronchitis 2015  . Joint pain   . Back pain     reason unknown  . Family history of adverse reaction to anesthesia     uncle with MH in the 60's,cousin in the 9570's with MH  . 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 COPD (chronic obstructive pulmonary disease)     Medications:  Scheduled:  . amiodarone  200 mg Oral Daily  . antiseptic oral rinse  7 mL Mouth Rinse BID  . furosemide  40 mg Intravenous BID  . [START ON 03/14/2015] levofloxacin (LEVAQUIN) IV  750 mg Intravenous Q48H  . piperacillin-tazobactam (ZOSYN)  IV  3.375 g Intravenous Q8H  . potassium chloride SA  20 mEq Oral BID  . sodium chloride  3 mL Intravenous Q12H  . sodium chloride  3 mL Intravenous Q12H  . [START ON 03/14/2015] vancomycin  1,250 mg Intravenous Q24H    Assessment: 74 yo female admitted for dyspnea, workup for CHF vs pneumonia. Vancomycin 2500 mg IV loading dose given Levaquin 750 mg IV x 1 dose given Zosyn 3.375 GM IV  every 8 hours started Calculated normalized CrCl 33.5 ml/min  Goal of Therapy:  Vancomycin trough level 15-20 mcg/ml  Plan:  Continue Zosyn 3.375 GM IV every 8 hours Vancomycin 1250 mg IV every 24 hours Levaquin 750 mg IV every 48 hours Vancomycin trough at steady state Labs per protocol  Josephine Igo, [facility] 03/13/2015,8:21 AM

## 2015-03-14 ENCOUNTER — Encounter: Payer: Medicare Other | Admitting: Adult Health

## 2015-03-14 DIAGNOSIS — Z954 Presence of other heart-valve replacement: Secondary | ICD-10-CM

## 2015-03-14 DIAGNOSIS — I5033 Acute on chronic diastolic (congestive) heart failure: Principal | ICD-10-CM

## 2015-03-14 LAB — BASIC METABOLIC PANEL
Anion gap: 10 (ref 5–15)
BUN: 35 mg/dL — AB (ref 6–20)
CHLORIDE: 96 mmol/L — AB (ref 101–111)
CO2: 33 mmol/L — AB (ref 22–32)
Calcium: 8.6 mg/dL — ABNORMAL LOW (ref 8.9–10.3)
Creatinine, Ser: 1.59 mg/dL — ABNORMAL HIGH (ref 0.44–1.00)
GFR, EST AFRICAN AMERICAN: 36 mL/min — AB (ref >60)
GFR, EST NON AFRICAN AMERICAN: 31 mL/min — AB (ref >60)
GLUCOSE: 87 mg/dL (ref 65–99)
POTASSIUM: 3.8 mmol/L (ref 3.5–5.1)
Sodium: 139 mmol/L (ref 135–145)

## 2015-03-14 LAB — PROTEIN ELECTROPHORESIS, SERUM
A/G Ratio: 1 (ref 0.7–1.7)
ALBUMIN ELP: 2.9 g/dL (ref 2.9–4.4)
ALPHA-2-GLOBULIN: 0.6 g/dL (ref 0.4–1.0)
Alpha-1-Globulin: 0.4 g/dL (ref 0.0–0.4)
Beta Globulin: 1 g/dL (ref 0.7–1.3)
Gamma Globulin: 0.9 g/dL (ref 0.4–1.8)
Globulin, Total: 2.9 g/dL (ref 2.2–3.9)
Total Protein ELP: 5.8 g/dL — ABNORMAL LOW (ref 6.0–8.5)

## 2015-03-14 LAB — PROTIME-INR
INR: 3.65 — ABNORMAL HIGH (ref 0.00–1.49)
Prothrombin Time: 35.5 seconds — ABNORMAL HIGH (ref 11.6–15.2)

## 2015-03-14 LAB — CBC
HCT: 31 % — ABNORMAL LOW (ref 36.0–46.0)
Hemoglobin: 9.7 g/dL — ABNORMAL LOW (ref 12.0–15.0)
MCH: 29.4 pg (ref 26.0–34.0)
MCHC: 31.3 g/dL (ref 30.0–36.0)
MCV: 93.9 fL (ref 78.0–100.0)
Platelets: 240 10*3/uL (ref 150–400)
RBC: 3.3 MIL/uL — ABNORMAL LOW (ref 3.87–5.11)
RDW: 16.4 % — ABNORMAL HIGH (ref 11.5–15.5)
WBC: 5.8 10*3/uL (ref 4.0–10.5)

## 2015-03-14 LAB — FOLATE: Folate: 35.8 ng/mL (ref >5.9)

## 2015-03-14 LAB — CALCIUM / CREATININE RATIO, URINE
CALCIUM/CREAT. RATIO: 31 mg/g{creat} (ref 0–260)
Calcium, Ur: 2.2 mg/dL
Creatinine, Urine: 70.9 mg/dL

## 2015-03-14 NOTE — Progress Notes (Signed)
ANTICOAGULATION CONSULT NOTE Pharmacy Consult for Coumadin Indication: atrial fibrillation  Allergies  Allergen Reactions  . Bee Venom Swelling  . Latex Rash  . Penicillins Rash   Patient Measurements: Height:  (157.5 cm) Weight: 234 lb 4.8 oz (106.278 kg) IBW/kg (Calculated) : 50.1 Heparin Dosing Weight:   Vital Signs: Temp: 98.9 F (37.2 C) (07/11 0600) Temp Source: Oral (07/11 0600) BP: 129/59 mmHg (07/11 0600) Pulse Rate: 71 (07/11 0600)  Labs:  Recent Labs  03/12/15 2156 03/13/15 0131 03/13/15 0615 03/13/15 1209 03/14/15 0640  HGB 9.1*  --  9.0*  --  9.7*  HCT 28.8*  --  28.5*  --  31.0*  PLT 222  --  217  --  240  LABPROT  --  38.2*  --   --  35.5*  INR  --  4.03*  --   --  3.65*  CREATININE 1.88*  --  1.70*  --  1.59*  TROPONINI 0.03 <0.03 <0.03 <0.03  --    Estimated Creatinine Clearance: 36.7 mL/min (by C-G formula based on Cr of 1.59).  Medical History: Past Medical History  Diagnosis Date  . Arthritis   . Chronic atrial fibrillation     takes Pradaxa daily  . Aortic stenosis     Severe by echo August 2015  . Pneumonia 09/2014  . History of bronchitis 2015  . Joint pain   . Back pain     reason unknown  . Family history of adverse reaction to anesthesia     uncle with MH in the 60's,cousin in the 49's with MH  . 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 COPD (chronic obstructive pulmonary disease)    Medications:  Prescriptions prior to admission  Medication Sig Dispense Refill Last Dose  . albuterol (PROAIR HFA) 108 (90 BASE) MCG/ACT inhaler Inhale 2 puffs into the lungs 4 (four) times daily as needed  for wheezing or shortness of breath.   03/12/2015 at Unknown time  . albuterol (PROVENTIL) (2.5 MG/3ML) 0.083% nebulizer solution Take 3 mLs (2.5 mg total) by nebulization every 4 (four) hours as needed for wheezing or shortness of breath. 75 mL 12 03/12/2015 at 1900  . amiodarone (PACERONE) 200 MG tablet Take 1 tablet (200 mg total) by mouth daily.   03/12/2015 at Unknown time  . cephALEXin (KEFLEX) 500 MG capsule Take 500 mg by mouth 4 (four) times daily. 10 day course starting on 03/01/2015   03/11/2015 at Unknown time  . potassium chloride SA (K-DUR,KLOR-CON) 20 MEQ tablet Take 1 tablet (20 mEq total) by mouth 2 (two) times daily.   03/12/2015 at Unknown time  . torsemide (DEMADEX) 20 MG tablet Take 2 tablets (40 mg total) by mouth daily. (Patient taking differently: Take 20 mg by mouth 2 (two) times daily. ) 90 tablet 3 03/12/2015 at Unknown time  . traMADol (ULTRAM) 50 MG tablet Take 1 tablet (50 mg total) by mouth every 6 (six) hours as needed (pain). 50 tablet 0 03/12/2015 at Unknown time  . warfarin (COUMADIN) 4 MG tablet Take 1 tablet (4 mg total) by mouth daily at 6 PM. As directed, bases on INR (Patient taking differently: Take 4  mg by mouth daily at 6 PM. Takes one tablet (4mg  total) Mondays through Fridays. *Takes 5mg  on Saturdays and Sundays*)   03/11/2015 at 1100a  . warfarin (COUMADIN) 5 MG tablet Take 5 mg by mouth 2 (two) times a week. Takes 5mg  on Saturdays and Sundays (*Takes 4mg  on all other days)   03/12/2015 at 1100  . amLODipine (NORVASC) 10 MG tablet Take 1 tablet (10 mg total) by mouth daily. (Patient not taking: Reported on 03/12/2015) 30 tablet  Taking  . budesonide (PULMICORT) 0.25 MG/2ML nebulizer solution Take 2 mLs (0.25 mg total) by nebulization every 6 (six) hours. (Patient not taking: Reported on 03/12/2015) 60 mL 12  at 1900  . chlorhexidine (PERIDEX) 0.12 % solution 15 mLs by Mouth Rinse route 2 (two) times daily. (Patient not taking: Reported on 03/12/2015) 120 mL 0 Taking  . food thickener  (THICK IT) POWD As needed (Patient not taking: Reported on 03/12/2015)  0 Taking  . predniSONE (DELTASONE) 10 MG tablet Take 1 tablet (10 mg total) by mouth daily with breakfast. (Patient not taking: Reported on 03/12/2015)   Taking   Assessment: Continuation of Coumadin PTA Labs reviewed INR elevated on admission  Goal of Therapy:  INR 2-3    Plan:  No Coumadin again today due to elevated INR INR/PT daily   Mady GemmaHayes, Kiriana Worthington R 03/14/2015,12:53 PM

## 2015-03-14 NOTE — Care Management Note (Signed)
Case Management Note  Patient Details  Name: Sandra Turner MRN: [id number] Date of Birth: 1941-03-25  Subjective/Objective:                  Pt admitted from home with CHF and pneumonia. Pt lives alone and will return home at discharge. Pt has family and friends who check on pt frequently. [facility] Pt has a neb machine and walker for home use.  Action/Plan: WIll need home O2 assessment prior to discharge. Will continue to follow.  Expected Discharge Date:                  Expected Discharge Plan:  Home w Home Health Services  In-House Referral:  NA  Discharge planning Services  CM Consult  Post Acute Care Choice:  Resumption of Svcs/PTA Provider Choice offered to:  Patient  DME Arranged:    DME Agency:     HH Arranged:  RN, PT [facility]  Status of Service:  In process, will continue to follow  Medicare Important Message Given:    Date Medicare IM Given:    Medicare IM give by:    Date Additional Medicare IM Given:    Additional Medicare Important Message give by:     If discussed at Long Length of Stay Meetings, dates discussed:    Additional Comments:  Cheryl FlashBlackwell, Bernese Doffing Crowder, RN 03/14/2015, 1:20 PM

## 2015-03-14 NOTE — Clinical Documentation Improvement (Signed)
Status post CABG.  In with dyspnea.  Documentation indicates Heart failure.  No Echo ordered.  Please specify type of heart failure. Chronic Systolic Congestive Heart Failure Chronic Diastolic Congestive Heart Failure Chronic Systolic & Diastolic Congestive Heart Failure Acute Systolic Congestive Heart Failure Acute Diastolic Congestive Heart Failure Acute Systolic & Diastolic Congestive Heart Failure Acute on Chronic Systolic Congestive Heart Failure Acute on Chronic Diastolic Congestive Heart Failure Acute on Chronic Systolic & Diastolic Congestive Heart Failure Other Condition________________________________________ Cannot Clinically Determine   Thank You, Harrie Jeanseborah L Ellarae Nevitt ,RN Clinical Documentation Specialist:    [facility]- Health Information Management [phone]

## 2015-03-14 NOTE — Progress Notes (Signed)
Initial Nutrition Assessment  DOCUMENTATION CODES:  Morbid obesity  INTERVENTION:   Encourage meal intake and provide high protein snack between meals as needed. Pt refuses oral supplements.  NUTRITION DIAGNOSIS:  Inadequate oral intake related to chronic illness as evidenced by per patient/family report.   GOAL:  Patient will meet greater than or equal to 90% of their needs   MONITOR:  PO intake, Labs, Weight trends  REASON FOR ASSESSMENT:  Malnutrition Screening Tool    ASSESSMENT: Pt has hx of Aortic stenosis and [facility] Her weight is within 2 kg of usual per pt. Her weight is likely not reflective of actual value given her bilateral lower extremity edema. She has been having recent difficulty with eating due to shortness of breath and says her appetite is very good but it takes her longer to eat. Meal intake records show po's 75-100% which is adequate to meet her est needs.  Nutrition focused exam: no significant findings  Height:  Ht Readings from Last 1 Encounters:  03/12/15 5\' 2"  (1.575 m)    Weight:  Wt Readings from Last 1 Encounters:  03/14/15 234 lb 4.8 oz (106.278 kg)    Ideal Body Weight:  50 kg  Wt Readings from Last 10 Encounters:  03/14/15 234 lb 4.8 oz (106.278 kg)  03/03/15 233 lb (105.688 kg)  02/21/15 223 lb 11.2 oz (101.47 kg)  01/17/15 247 lb (112.038 kg)  01/17/15 237 lb 11.2 oz (107.82 kg)  12/27/14 231 lb (104.781 kg)  11/22/14 240 lb (108.863 kg)  11/01/14 240 lb (108.863 kg)  10/04/14 241 lb 9.6 oz (109.589 kg)  09/11/14 240 lb 11.2 oz (109.181 kg)    BMI:  Body mass index is 42.84 kg/(m^2).  Estimated Nutritional Needs:  Kcal:   1530-1700  Protein:   90-100 gr  Fluid:   >1500 ml daily  Skin:   WDL  Diet Order:   Heart Healthy  EDUCATION NEEDS:  Education needs addressed   Intake/Output Summary (Last 24 hours) at 03/14/15 1447 Last data filed at 03/14/15 1200  Gross  per 24 hour  Intake 1078.33 ml  Output   2750 ml  Net -1671.67 ml    Last BM:  Unknown

## 2015-03-14 NOTE — Progress Notes (Signed)
[facility] PROGRESS NOTE  Sandra Turner ZOX:[id number] DOB: 1940/11/22    PCP:   [name], MD   HPI: Sandra Turner is an 74 y.o. 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 She has been having orthopnea, pedal edema, and DOE for several months. No fever, chills, coughs, or leukocytosis. She has slight AKI with Cr of 1.8. She has mild anemia, with Hb of 9, and has no clinical evidence of GI Bleed.  She is feeling better with IV Bumex 1mg  BID. Cr improved, and K is OK.   Rewiew of Systems:  Constitutional: Negative for malaise, fever and chills. No significant weight loss or weight gain Eyes: Negative for eye pain, redness and discharge, diplopia, visual changes, or flashes of light. ENMT: Negative for ear pain, hoarseness, nasal congestion, sinus pressure and sore throat. No headaches; tinnitus, drooling, or problem swallowing. Cardiovascular: Negative for chest pain, palpitations, diaphoresis, dyspnea and peripheral edema. ; No orthopnea, PND Respiratory: Negative for cough, hemoptysis, wheezing and stridor. No pleuritic chestpain. Gastrointestinal: Negative for nausea, vomiting, diarrhea, constipation, abdominal pain, melena, blood in stool, hematemesis, jaundice and rectal bleeding.    Genitourinary: Negative for frequency, dysuria, incontinence,flank pain and hematuria; Musculoskeletal: Negative for back pain and neck pain. Negative for swelling and trauma.;  Skin: . Negative for pruritus, rash, abrasions, bruising and skin lesion.; ulcerations Neuro: Negative for headache, lightheadedness and neck stiffness. Negative for weakness, altered level of consciousness , altered mental status, extremity weakness, burning feet, involuntary movement, seizure and syncope.  Psych: negative for anxiety,  depression, insomnia, tearfulness, panic attacks, hallucinations, paranoia, suicidal or homicidal ideation    Past Medical History  Diagnosis Date  . Arthritis   . Chronic atrial fibrillation     takes Pradaxa daily  . Aortic stenosis     Severe by echo August 2015  . Pneumonia 09/2014  . History of bronchitis 2015  . Joint pain   . Back pain     reason unknown  . Family history of adverse reaction to anesthesia     uncle with MH in the 60's,cousin in the 73's with MH  . 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 COPD (chronic obstructive pulmonary disease)     Past Surgical History  Procedure Laterality Date  . None    . Left and right heart catheterization with coronary angiogram N/A 11/01/2014    Procedure: LEFT AND RIGHT HEART CATHETERIZATION WITH CORONARY ANGIOGRAM;  Surgeon: [name], MD;  Location: [facility] CATH LAB;  Service: Cardiovascular;  Laterality: N/A;  . Aortic valve replacement N/A 01/19/2015    Procedure: AORTIC VALVE REPLACEMENT (AVR);  Surgeon: Purcell Nails, MD;  Location: [facility] OR;  Service: Open Heart Surgery;  Laterality: N/A;  . Maze N/A 01/19/2015    Procedure: MAZE;  Surgeon: Purcell Nails, MD;  Location: [facility] OR;  Service: Open Heart Surgery;  Laterality: N/A;  . Aortic root enlargement N/A 01/19/2015    Procedure:  AORTIC ROOT ENLARGEMENT;  Surgeon: Purcell Nails, MD;  Location: [facility] OR;  Service: Open Heart Surgery;  Laterality: N/A;  . Tee without cardioversion N/A 01/19/2015    Procedure: TRANSESOPHAGEAL ECHOCARDIOGRAM (TEE);  Surgeon: Purcell Nailslarence H Owen, MD;  [facility];  Service: Open Heart Surgery;  Laterality:  N/A;    Medications:  HOME MEDS: Prior to Admission medications   Medication Sig Start Date End Date Taking? Authorizing Provider  albuterol (PROAIR HFA) 108 (90 BASE) MCG/ACT inhaler Inhale 2 puffs into the lungs 4 (four) times daily as needed for wheezing or shortness of breath.   Yes Historical Provider, MD  albuterol (PROVENTIL) (2.5 MG/3ML) 0.083% nebulizer solution Take 3 mLs (2.5 mg total) by nebulization every 4 (four) hours as needed for wheezing or shortness of breath. 09/15/14  Yes Erick BlinksJehanzeb Memon, MD  amiodarone (PACERONE) 200 MG tablet Take 1 tablet (200 mg total) by mouth daily. 02/21/15  Yes Wayne E Gold, PA-C  cephALEXin (KEFLEX) 500 MG capsule Take 500 mg by mouth 4 (four) times daily. 10 day course starting on 03/01/2015   Yes Historical Provider, MD  potassium chloride SA (K-DUR,KLOR-CON) 20 MEQ tablet Take 1 tablet (20 mEq total) by mouth 2 (two) times daily. 02/21/15  Yes Wayne E Gold, PA-C  torsemide (DEMADEX) 20 MG tablet Take 2 tablets (40 mg total) by mouth daily. Patient taking differently: Take 20 mg by mouth 2 (two) times daily.  03/03/15  Yes Jodelle GrossKathryn M Lawrence, NP  traMADol (ULTRAM) 50 MG tablet Take 1 tablet (50 mg total) by mouth every 6 (six) hours as needed (pain). 02/21/15  Yes Wayne E Gold, PA-C  warfarin (COUMADIN) 4 MG tablet Take 1 tablet (4 mg total) by mouth daily at 6 PM. As directed, bases on INR Patient taking differently: Take 4 mg by mouth daily at 6 PM. Takes one tablet (4mg  total) Mondays through Fridays. *Takes 5mg  on Saturdays and Sundays* 02/21/15  Yes Wayne E Gold, PA-C  warfarin (COUMADIN) 5 MG tablet Take 5 mg by mouth 2 (two) times a week. Takes 5mg  on Saturdays and Sundays (*Takes 4mg  on all other days)   Yes Historical Provider, MD  amLODipine (NORVASC) 10 MG tablet Take 1 tablet (10 mg total) by mouth daily. Patient not taking: Reported on 03/12/2015 02/21/15   Glenice LaineWayne E Gold, PA-C  budesonide (PULMICORT) 0.25 MG/2ML nebulizer solution Take 2 mLs  (0.25 mg total) by nebulization every 6 (six) hours. Patient not taking: Reported on 03/12/2015 02/21/15   Rowe ClackWayne E Gold, PA-C  chlorhexidine (PERIDEX) 0.12 % solution 15 mLs by Mouth Rinse route 2 (two) times daily. Patient not taking: Reported on 03/12/2015 02/21/15   Rowe ClackWayne E Gold, PA-C  food thickener [facility](THICK IT) POWD As needed Patient not taking: Reported on 03/12/2015 02/21/15   Rowe ClackWayne E Gold, PA-C  predniSONE (DELTASONE) 10 MG tablet Take 1 tablet (10 mg total) by mouth daily with breakfast. Patient not taking: Reported on 03/12/2015 02/21/15   Rowe ClackWayne E Gold, PA-C     Allergies:  Allergies  Allergen Reactions  . Bee Venom Swelling  . Latex Rash  . Penicillins Rash    Social History:   reports that she has never smoked. She does not have any smokeless tobacco history on file. She reports that she does not drink alcohol or use illicit drugs.  Family History: Family History  Problem Relation Age of Onset  . Breast cancer Mother   . Congestive Heart Failure Mother   . Cancer Father     ? Lymphoma  . Malignant hyperthermia Cousin  Physical Exam: Filed Vitals:   03/13/15 2300 03/14/15 0600 03/14/15 0812 03/14/15 1309  BP:  129/59  108/43  Pulse:  71    Temp: 97.9 F (36.6 C) 98.9 F (37.2 C)  98.2 F (36.8 C)  TempSrc:  Oral  Oral  Resp:  21  20  Height:      Weight:  106.278 kg (234 lb 4.8 oz)    SpO2:  98% 98% 99%   Blood pressure 108/43, pulse 71, temperature 98.2 F (36.8 C), temperature source Oral, resp. rate 20, height 5\' 2"  (1.575 m), weight 106.278 kg (234 lb 4.8 oz), SpO2 99 %.  GEN:  Pleasant patient lying in the stretcher in no acute distress; cooperative with exam. PSYCH:  alert and oriented x4; does not appear anxious or depressed; affect is appropriate. HEENT: Mucous membranes pink and anicteric; PERRLA; EOM intact; no cervical lymphadenopathy nor thyromegaly or carotid bruit; no JVD; There were no stridor. Neck is very supple. Breasts:: Not examined CHEST  WALL: No tenderness CHEST: Normal respiration, clear to auscultation bilaterally.  HEART: Regular rate and rhythm.  There are no murmur, rub, or gallops.   BACK: No kyphosis or scoliosis; no CVA tenderness ABDOMEN: soft and non-tender; no masses, no organomegaly, normal abdominal bowel sounds; no pannus; no intertriginous candida. There is no rebound and no distention. Rectal Exam: Not done EXTREMITIES: No bone or joint deformity; age-appropriate arthropathy of the hands and knees; 2+ edema; no ulcerations.  There is no calf tenderness. Genitalia: not examined PULSES: 2+ and symmetric SKIN: Normal hydration no rash or ulceration CNS: Cranial nerves 2-12 grossly intact no focal lateralizing neurologic deficit.  Speech is fluent; uvula elevated with phonation, facial symmetry and tongue midline. DTR are normal bilaterally, cerebella exam is intact, barbinski is negative and strengths are equaled bilaterally.  No sensory loss.   Labs on Admission:  Basic Metabolic Panel:  Recent Labs Lab 03/12/15 2156 03/13/15 0615 03/14/15 0640  NA 136 135 139  K 3.9 4.0 3.8  CL 93* 94* 96*  CO2 33* 31 33*  GLUCOSE 97 87 87  BUN 48* 45* 35*  CREATININE 1.88* 1.70* 1.59*  CALCIUM 8.5* 8.4* 8.6*   Liver Function Tests:  Recent Labs Lab 03/12/15 2156 03/13/15 0615  AST 27 23  ALT 27 25  ALKPHOS 142* 129*  BILITOT 0.7 1.2  PROT 6.8 6.2*  ALBUMIN 3.2* 2.9*   CBC:  Recent Labs Lab 03/12/15 2156 03/13/15 0615 03/14/15 0640  WBC 7.0 6.5 5.8  NEUTROABS 5.2  --   --   HGB 9.1* 9.0* 9.7*  HCT 28.8* 28.5* 31.0*  MCV 93.5 93.8 93.9  PLT 222 217 240   Cardiac Enzymes:  Recent Labs Lab 03/12/15 2156 03/13/15 0131 03/13/15 0615 03/13/15 1209  TROPONINI 0.03 <0.03 <0.03 <0.03    CBG: No results for input(s): GLUCAP in the last 168 hours.   Radiological Exams on Admission: US Renal  03/13/2015   CLINICAL DATA:  74 year old female with acute renal failure.  EXAM: RENAL / URINARY  TRACT ULTRASOUND COMPLETE  COMPARISON:  None.  FINDINGS: Right Kidney:  Length: 10.6 cm. Echogenicity is upper limits of normal. No mass or hydronephrosis visualized.  Left Kidney:  Length: 10.5 cm. Echogenicity is upper limits of normal. No mass or hydronephrosis visualized.  Bladder:  A Foley catheter is identified and a collapsed bladder.  IMPRESSION: Upper limits of normal renal echogenicity may represent medical renal disease.  No evidence of hydronephrosis.   Electronically Signed  By: Harmon Pier M.D.   On: 03/13/2015 12:37   Dg Chest Port 1 View  03/12/2015   CLINICAL DATA:  Dyspnea  EXAM: PORTABLE CHEST - 1 VIEW  COMPARISON:  03/03/2015  FINDINGS: Bilateral airspace disease which is asymmetric to the left. Probable small pleural effusions.  Stable cardiopericardial enlargement in this patient post aortic valve replacement and left atrial exclusion. Blood flow is cephalized in the vascular pedicle is widened.  IMPRESSION: Bilateral airspace disease which could reflect pneumonia or asymmetric edema.   Electronically Signed   By: Marnee Spring M.D.   On: 03/12/2015 21:51   Assessment/Plan Present on Admission:  . Shortness of breath . Dyspnea  PLAN:  Acute on chronic diastolic CHF:  Will continue with Diuresis.  She is volume overloaded.  Feeling better. Will continue with checking lytes and Cr. Anemia:  Stable.  No evidence of GI bleed. PNA:  Will continue with IV antiotics for now. Afib:  Continue with Coumadin, per pharmacy dosing.   Other plans as per orders.  Code Status: FULL Unk Lightning, MD. [facility] Pager [phone] 7pm to 7am.  03/14/2015, 3:49 PM

## 2015-03-14 NOTE — Progress Notes (Signed)
Cardiology Office Note   Date:  03/14/2015   ID:  Cheyenne AdasRita Ricketts, DOB 1940-11-27, MRN [id number]  ERROR NO Show

## 2015-03-15 DIAGNOSIS — I509 Heart failure, unspecified: Secondary | ICD-10-CM | POA: Insufficient documentation

## 2015-03-15 LAB — CBC
HEMATOCRIT: 30 % — AB (ref 36.0–46.0)
Hemoglobin: 9.3 g/dL — ABNORMAL LOW (ref 12.0–15.0)
MCH: 29.2 pg (ref 26.0–34.0)
MCHC: 31 g/dL (ref 30.0–36.0)
MCV: 94.3 fL (ref 78.0–100.0)
Platelets: 232 10*3/uL (ref 150–400)
RBC: 3.18 MIL/uL — ABNORMAL LOW (ref 3.87–5.11)
RDW: 16.2 % — ABNORMAL HIGH (ref 11.5–15.5)
WBC: 5.9 10*3/uL (ref 4.0–10.5)

## 2015-03-15 LAB — BASIC METABOLIC PANEL
Anion gap: 11 (ref 5–15)
BUN: 28 mg/dL — ABNORMAL HIGH (ref 6–20)
CALCIUM: 8.4 mg/dL — AB (ref 8.9–10.3)
CO2: 33 mmol/L — ABNORMAL HIGH (ref 22–32)
CREATININE: 1.4 mg/dL — AB (ref 0.44–1.00)
Chloride: 96 mmol/L — ABNORMAL LOW (ref 101–111)
GFR, EST AFRICAN AMERICAN: 42 mL/min — AB (ref >60)
GFR, EST NON AFRICAN AMERICAN: 37 mL/min — AB (ref >60)
Glucose, Bld: 91 mg/dL (ref 65–99)
POTASSIUM: 3.6 mmol/L (ref 3.5–5.1)
SODIUM: 140 mmol/L (ref 135–145)

## 2015-03-15 LAB — PROTIME-INR
INR: 3.63 — AB (ref 0.00–1.49)
Prothrombin Time: 35.3 seconds — ABNORMAL HIGH (ref 11.6–15.2)

## 2015-03-15 NOTE — Progress Notes (Signed)
ANTICOAGULATION CONSULT NOTE Pharmacy Consult for Coumadin Indication: atrial fibrillation  Allergies  Allergen Reactions  . Bee Venom Swelling  . Latex Rash  . Penicillins Rash   Patient Measurements: Height:  (157.5 cm) Weight: 233 lb 9.6 oz (105.96 kg) IBW/kg (Calculated) : 50.1  Vital Signs: Temp: 98.3 F (36.8 C) (07/12 0620) Temp Source: Oral (07/12 0620) BP: 116/50 mmHg (07/12 0620) Pulse Rate: 67 (07/12 0620)  Labs:  Recent Labs  03/13/15 0131 03/13/15 0615 03/13/15 1209 03/14/15 0640 03/15/15 0652  HGB  --  9.0*  --  9.7* 9.3*  HCT  --  28.5*  --  31.0* 30.0*  PLT  --  217  --  240 232  LABPROT 38.2*  --   --  35.5* 35.3*  INR 4.03*  --   --  3.65* 3.63*  CREATININE  --  1.70*  --  1.59* 1.40*  TROPONINI <0.03 <0.03 <0.03  --   --    Estimated Creatinine Clearance: 41.6 mL/min (by C-G formula based on Cr of 1.4).  Medical History: Past Medical History  Diagnosis Date  . Arthritis   . Chronic atrial fibrillation     takes Pradaxa daily  . Aortic stenosis     Severe by echo August 2015  . Pneumonia 09/2014  . History of bronchitis 2015  . Joint pain   . Back pain     reason unknown  . Family history of adverse reaction to anesthesia     uncle with MH in the 60's,cousin in the 52's with MH  . 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 COPD (chronic obstructive pulmonary disease)    Medications:  Prescriptions prior to admission  Medication Sig Dispense Refill Last Dose  . albuterol (PROAIR HFA) 108 (90 BASE) MCG/ACT inhaler Inhale 2 puffs into the lungs 4 (four) times daily as needed for wheezing or shortness  of breath.   03/12/2015 at Unknown time  . albuterol (PROVENTIL) (2.5 MG/3ML) 0.083% nebulizer solution Take 3 mLs (2.5 mg total) by nebulization every 4 (four) hours as needed for wheezing or shortness of breath. 75 mL 12 03/12/2015 at 1900  . amiodarone (PACERONE) 200 MG tablet Take 1 tablet (200 mg total) by mouth daily.   03/12/2015 at Unknown time  . cephALEXin (KEFLEX) 500 MG capsule Take 500 mg by mouth 4 (four) times daily. 10 day course starting on 03/01/2015   03/11/2015 at Unknown time  . potassium chloride SA (K-DUR,KLOR-CON) 20 MEQ tablet Take 1 tablet (20 mEq total) by mouth 2 (two) times daily.   03/12/2015 at Unknown time  . torsemide (DEMADEX) 20 MG tablet Take 2 tablets (40 mg total) by mouth daily. (Patient taking differently: Take 20 mg by mouth 2 (two) times daily. ) 90 tablet 3 03/12/2015 at Unknown time  . traMADol (ULTRAM) 50 MG tablet Take 1 tablet (50 mg total) by mouth every 6 (six) hours as needed (pain). 50 tablet 0 03/12/2015 at Unknown time  . warfarin (COUMADIN) 4 MG tablet Take 1 tablet (4 mg total) by mouth daily at 6 PM. As directed, bases on INR (Patient taking differently: Take 4 mg by mouth daily at 6  PM. Takes one tablet (4mg  total) Mondays through Fridays. *Takes 5mg  on Saturdays and Sundays*)   03/11/2015 at 1100a  . warfarin (COUMADIN) 5 MG tablet Take 5 mg by mouth 2 (two) times a week. Takes 5mg  on Saturdays and Sundays (*Takes 4mg  on all other days)   03/12/2015 at 1100  . amLODipine (NORVASC) 10 MG tablet Take 1 tablet (10 mg total) by mouth daily. (Patient not taking: Reported on 03/12/2015) 30 tablet  Taking  . budesonide (PULMICORT) 0.25 MG/2ML nebulizer solution Take 2 mLs (0.25 mg total) by nebulization every 6 (six) hours. (Patient not taking: Reported on 03/12/2015) 60 mL 12  at 1900  . chlorhexidine (PERIDEX) 0.12 % solution 15 mLs by Mouth Rinse route 2 (two) times daily. (Patient not taking: Reported on 03/12/2015) 120 mL 0 Taking  . food thickener (THICK IT) POWD As needed  (Patient not taking: Reported on 03/12/2015)  0 Taking  . predniSONE (DELTASONE) 10 MG tablet Take 1 tablet (10 mg total) by mouth daily with breakfast. (Patient not taking: Reported on 03/12/2015)   Taking   Assessment: Continuation of Coumadin PTA Labs reviewed INR elevated.  Goal of Therapy:  INR 2-3    Plan:  No Coumadin again today due to elevated INR INR/PT daily   Mady GemmaHayes, Keltin Baird R 03/15/2015,1:13 PM

## 2015-03-15 NOTE — Progress Notes (Signed)
[facility] PROGRESS NOTE  Sandra Turner ZOX:[id number] DOB: Jan 20, 1941    PCP:   [name], MD   HPI:  Sandra Turner is an 74 y.o. 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 She has been having orthopnea, pedal edema, and DOE for several months. No fever, chills, coughs, or leukocytosis. She has slight AKI with Cr of 1.8. She has mild anemia, with Hb of 9, and has no clinical evidence of GI Bleed. Her Lasix was changed over to IV Bumex, and She is feeling better with IV Bumex 1mg  BID. Cr improved, and K is OK. She does have baseline shortness of breath from her COPD.   Rewiew of Systems:  Constitutional: Negative for malaise, fever and chills. No significant weight loss or weight gain Eyes: Negative for eye pain, redness and discharge, diplopia, visual changes, or flashes of light. ENMT: Negative for ear pain, hoarseness, nasal congestion, sinus pressure and sore throat. No headaches; tinnitus, drooling, or problem swallowing. Cardiovascular: Negative for chest pain, palpitations, diaphoresis. Respiratory: Negative for cough, hemoptysis, wheezing and stridor. No pleuritic chestpain. Gastrointestinal: Negative for nausea, vomiting, diarrhea, constipation, abdominal pain, melena, blood in stool, hematemesis, jaundice and rectal bleeding.    Genitourinary: Negative for frequency, dysuria, incontinence,flank pain and hematuria; Musculoskeletal: Negative for back pain and neck pain. Negative for swelling and trauma.;  Skin: . Negative for pruritus, rash, abrasions, bruising and skin lesion.; ulcerations Neuro: Negative for headache, lightheadedness and neck stiffness. Negative for weakness, altered level of consciousness , altered mental status, extremity weakness, burning feet, involuntary movement, seizure and  syncope.  Psych: negative for anxiety, depression, insomnia, tearfulness, panic attacks, hallucinations, paranoia, suicidal or homicidal ideation    Past Medical History  Diagnosis Date  . Arthritis   . Chronic atrial fibrillation     takes Pradaxa daily  . Aortic stenosis     Severe by echo August 2015  . Pneumonia 09/2014  . History of bronchitis 2015  . Joint pain   . Back pain     reason unknown  . Family history of adverse reaction to anesthesia     uncle with MH in the 60's,cousin in the 77's with MH  . 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 COPD (chronic obstructive pulmonary disease)     Past Surgical History  Procedure Laterality Date  . None    . Left and right heart catheterization with coronary angiogram N/A 11/01/2014    Procedure: LEFT AND RIGHT HEART CATHETERIZATION WITH CORONARY ANGIOGRAM;  Surgeon: [name], MD;  Location: [facility] CATH LAB;  Service: Cardiovascular;  Laterality: N/A;  . Aortic valve replacement N/A 01/19/2015    Procedure: AORTIC VALVE REPLACEMENT (AVR);  Surgeon: Purcell Nails, MD;  Location: [facility] OR;  Service: Open Heart Surgery;  Laterality: N/A;  . Maze N/A 01/19/2015    Procedure: MAZE;  Surgeon: Purcell Nails, MD;  Location: [facility] OR;  Service: Open Heart Surgery;  Laterality: N/A;  . Aortic root enlargement N/A 01/19/2015  Procedure:  AORTIC ROOT ENLARGEMENT;  Surgeon: Purcell Nailslarence H Owen, MD;  [facility];  Service: Open Heart Surgery;  Laterality: N/A;  . Tee without cardioversion N/A 01/19/2015    Procedure: TRANSESOPHAGEAL ECHOCARDIOGRAM (TEE);  Surgeon: Purcell Nailslarence H Owen, MD;  [facility];   Service: Open Heart Surgery;  Laterality: N/A;    Medications:  HOME MEDS: Prior to Admission medications   Medication Sig Start Date End Date Taking? Authorizing Provider  albuterol (PROAIR HFA) 108 (90 BASE) MCG/ACT inhaler Inhale 2 puffs into the lungs 4 (four) times daily as needed for wheezing or shortness of breath.   Yes Historical Provider, MD  albuterol (PROVENTIL) (2.5 MG/3ML) 0.083% nebulizer solution Take 3 mLs (2.5 mg total) by nebulization every 4 (four) hours as needed for wheezing or shortness of breath. 09/15/14  Yes Erick BlinksJehanzeb Memon, MD  amiodarone (PACERONE) 200 MG tablet Take 1 tablet (200 mg total) by mouth daily. 02/21/15  Yes Wayne E Gold, PA-C  cephALEXin (KEFLEX) 500 MG capsule Take 500 mg by mouth 4 (four) times daily. 10 day course starting on 03/01/2015   Yes Historical Provider, MD  potassium chloride SA (K-DUR,KLOR-CON) 20 MEQ tablet Take 1 tablet (20 mEq total) by mouth 2 (two) times daily. 02/21/15  Yes Wayne E Gold, PA-C  torsemide (DEMADEX) 20 MG tablet Take 2 tablets (40 mg total) by mouth daily. Patient taking differently: Take 20 mg by mouth 2 (two) times daily.  03/03/15  Yes Jodelle GrossKathryn M Lawrence, NP  traMADol (ULTRAM) 50 MG tablet Take 1 tablet (50 mg total) by mouth every 6 (six) hours as needed (pain). 02/21/15  Yes Wayne E Gold, PA-C  warfarin (COUMADIN) 4 MG tablet Take 1 tablet (4 mg total) by mouth daily at 6 PM. As directed, bases on INR Patient taking differently: Take 4 mg by mouth daily at 6 PM. Takes one tablet (4mg  total) Mondays through Fridays. *Takes 5mg  on Saturdays and Sundays* 02/21/15  Yes Wayne E Gold, PA-C  warfarin (COUMADIN) 5 MG tablet Take 5 mg by mouth 2 (two) times a week. Takes 5mg  on Saturdays and Sundays (*Takes 4mg  on all other days)   Yes Historical Provider, MD  amLODipine (NORVASC) 10 MG tablet Take 1 tablet (10 mg total) by mouth daily. Patient not taking: Reported on 03/12/2015 02/21/15   Glenice LaineWayne E Gold, PA-C  budesonide (PULMICORT) 0.25  MG/2ML nebulizer solution Take 2 mLs (0.25 mg total) by nebulization every 6 (six) hours. Patient not taking: Reported on 03/12/2015 02/21/15   Rowe ClackWayne E Gold, PA-C  chlorhexidine (PERIDEX) 0.12 % solution 15 mLs by Mouth Rinse route 2 (two) times daily. Patient not taking: Reported on 03/12/2015 02/21/15   Rowe ClackWayne E Gold, PA-C  food thickener [facility](THICK IT) POWD As needed Patient not taking: Reported on 03/12/2015 02/21/15   Rowe ClackWayne E Gold, PA-C  predniSONE (DELTASONE) 10 MG tablet Take 1 tablet (10 mg total) by mouth daily with breakfast. Patient not taking: Reported on 03/12/2015 02/21/15   Rowe ClackWayne E Gold, PA-C     Allergies:  Allergies  Allergen Reactions  . Bee Venom Swelling  . Latex Rash  . Penicillins Rash    Social History:   reports that she has never smoked. She does not have any smokeless tobacco history on file. She reports that she does not drink alcohol or use illicit drugs.  Family History: Family History  Problem Relation Age of Onset  . Breast cancer Mother   . Congestive Heart Failure Mother   . Cancer  Father     ? Lymphoma  . Malignant hyperthermia Cousin      Physical Exam: Filed Vitals:   03/14/15 0812 03/14/15 1309 03/14/15 2244 03/15/15 0620  BP:  108/43 129/51 116/50  Pulse:    67  Temp:  98.2 F (36.8 C) 98.5 F (36.9 C) 98.3 F (36.8 C)  TempSrc:  Oral Oral Oral  Resp:  Height:      Weight:    105.96 kg (233 lb 9.6 oz)  SpO2: 98% 99% 99% 99%   Blood pressure 116/50, pulse 67, temperature 98.3 F (36.8 C), temperature source Oral, resp. rate 20, height  (1.575 m), weight 105.96 kg (233 lb 9.6 oz), SpO2 99 %.  GEN:  Pleasant patient lying in the stretcher in no acute distress; cooperative with exam. PSYCH:  alert and oriented x4; does not appear anxious or depressed; affect is appropriate. HEENT: Mucous membranes pink and anicteric; PERRLA; EOM intact; no cervical lymphadenopathy nor thyromegaly or carotid bruit; no JVD; There were no stridor. Neck  is very supple. Breasts:: Not examined CHEST WALL: No tenderness CHEST: Normal respiration, clear to auscultation bilaterally.  HEART: Regular rate and rhythm.  There are no murmur, rub, or gallops.   BACK: No kyphosis or scoliosis; no CVA tenderness ABDOMEN: soft and non-tender; no masses, no organomegaly, normal abdominal bowel sounds; no pannus; no intertriginous candida. There is no rebound and no distention. Rectal Exam: Not done EXTREMITIES: No bone or joint deformity; age-appropriate arthropathy of the hands and knees; no edema; no ulcerations.  There is no calf tenderness. Genitalia: not examined PULSES: 2+ and symmetric SKIN: Normal hydration no rash or ulceration CNS: Cranial nerves 2-12 grossly intact no focal lateralizing neurologic deficit.  Speech is fluent; uvula elevated with phonation, facial symmetry and tongue midline. DTR are normal bilaterally, cerebella exam is intact, barbinski is negative and strengths are equaled bilaterally.  No sensory loss.   Labs on Admission:  Basic Metabolic Panel:  Recent Labs Lab 03/12/15 2156 03/13/15 0615 03/14/15 0640 03/15/15 0652  NA 136 135 139 140  K 3.9 4.0 3.8 3.6  CL 93* 94* 96* 96*  CO2 33* 31 33* 33*  GLUCOSE 97 87 87 91  BUN 48* 45* 35* 28*  CREATININE 1.88* 1.70* 1.59* 1.40*  CALCIUM 8.5* 8.4* 8.6* 8.4*   Liver Function Tests:  Recent Labs Lab 03/12/15 2156 03/13/15 0615  AST 27 23  ALT 27 25  ALKPHOS 142* 129*  BILITOT 0.7 1.2  PROT 6.8 6.2*  ALBUMIN 3.2* 2.9*   CBC:  Recent Labs Lab 03/12/15 2156 03/13/15 0615 03/14/15 0640 03/15/15 0652  WBC 7.0 6.5 5.8 5.9  NEUTROABS 5.2  --   --   --   HGB 9.1* 9.0* 9.7* 9.3*  HCT 28.8* 28.5* 31.0* 30.0*  MCV 93.5 93.8 93.9 94.3  PLT 222 217 240 232   Cardiac Enzymes:  Recent Labs Lab 03/12/15 2156 03/13/15 0131 03/13/15 0615 03/13/15 1209  TROPONINI 0.03 <0.03 <0.03 <0.03   EKG: Independently reviewed.   Assessment/Plan Present on Admission:   . Shortness of breath . Dyspnea . COPD (chronic obstructive pulmonary disease) . Acute on chronic diastolic heart failure  PLAN: Acute on chronic diastolic CHF: Will continue with Diuresis. She is volume overloaded. Feeling better. Will continue with checking lytes and Cr.  Note her Cr is improving with diuresis.  Anemia: Stable. No evidence of GI bleed. PNA: Will continue with IV antiotics for now. Afib: Continue with  Coumadin, per pharmacy dosing.   Other plans as per orders.  Code Status: FULL Unk Lightning, MD. [facility] Pager [phone] 7pm to 7am.  03/15/2015, 2:33 PM

## 2015-03-15 NOTE — Care Management Important Message (Signed)
Important Message  Patient Details  Name: Sandra Turner MRN: [id number] Date of Birth: May 20, 1941   Medicare Important Message Given:  Yes-second notification given    Bernadette HoitShoffner, Mardi Cannady Coleman 03/15/2015, 3:13 PM

## 2015-03-15 NOTE — Progress Notes (Signed)
Patient voiced foley cathetar was too pain and uncomfortable, request that it be removed. Foley cathetar removed per patient request.

## 2015-03-16 ENCOUNTER — Inpatient Hospital Stay (HOSPITAL_COMMUNITY): Payer: Medicare Other

## 2015-03-16 DIAGNOSIS — R0600 Dyspnea, unspecified: Secondary | ICD-10-CM

## 2015-03-16 LAB — CBC
HCT: 29.8 % — ABNORMAL LOW (ref 36.0–46.0)
Hemoglobin: 9.2 g/dL — ABNORMAL LOW (ref 12.0–15.0)
MCH: 29.4 pg (ref 26.0–34.0)
MCHC: 30.9 g/dL (ref 30.0–36.0)
MCV: 95.2 fL (ref 78.0–100.0)
PLATELETS: 244 10*3/uL (ref 150–400)
RBC: 3.13 MIL/uL — ABNORMAL LOW (ref 3.87–5.11)
RDW: 16.2 % — AB (ref 11.5–15.5)
WBC: 6.4 10*3/uL (ref 4.0–10.5)

## 2015-03-16 LAB — PROTIME-INR
INR: 3.22 — AB (ref 0.00–1.49)
Prothrombin Time: 32.3 seconds — ABNORMAL HIGH (ref 11.6–15.2)

## 2015-03-16 LAB — BASIC METABOLIC PANEL
Anion gap: 8 (ref 5–15)
BUN: 23 mg/dL — ABNORMAL HIGH (ref 6–20)
CO2: 36 mmol/L — ABNORMAL HIGH (ref 22–32)
Calcium: 8.6 mg/dL — ABNORMAL LOW (ref 8.9–10.3)
Chloride: 98 mmol/L — ABNORMAL LOW (ref 101–111)
Creatinine, Ser: 1.25 mg/dL — ABNORMAL HIGH (ref 0.44–1.00)
GFR calc non Af Amer: 42 mL/min — ABNORMAL LOW (ref >60)
GFR, EST AFRICAN AMERICAN: 49 mL/min — AB (ref >60)
GLUCOSE: 93 mg/dL (ref 65–99)
Potassium: 3.5 mmol/L (ref 3.5–5.1)
SODIUM: 142 mmol/L (ref 135–145)

## 2015-03-16 LAB — VANCOMYCIN, TROUGH: Vancomycin Tr: 21 ug/mL — ABNORMAL HIGH (ref 10.0–20.0)

## 2015-03-16 MED ORDER — WARFARIN SODIUM 1 MG PO TABS
0.5000 mg | ORAL_TABLET | Freq: Once | ORAL | Status: AC
  Administered 2015-03-16: 0.5 mg via ORAL
  Filled 2015-03-16: qty 1

## 2015-03-16 MED ORDER — VANCOMYCIN HCL IN DEXTROSE 1-5 GM/200ML-% IV SOLN
1000.0000 mg | INTRAVENOUS | Status: DC
  Administered 2015-03-16 – 2015-03-17 (×2): 1000 mg via INTRAVENOUS
  Filled 2015-03-16 (×3): qty 200

## 2015-03-16 NOTE — Progress Notes (Signed)
  Echocardiogram 2D Echocardiogram has been performed.  Sandra Turner Sandra Turner 03/16/2015, 3:02 PM

## 2015-03-16 NOTE — Progress Notes (Signed)
SATURATION QUALIFICATIONS:  ° ° °Patient Saturations on Room Air at Rest = 94% ° °Patient Saturations on Room Air while Ambulating = 86% ° °Patient Saturations on 2 Liters of oxygen while Ambulating = 96% ° ° °

## 2015-03-16 NOTE — Progress Notes (Signed)
ANTICOAGULATION CONSULT NOTE- follow up  Pharmacy Consult for Coumadin Indication: atrial fibrillation  Allergies  Allergen Reactions  . Bee Venom Swelling  . Latex Rash  . Penicillins Rash   Patient Measurements: Height: 5\' 2"  (157.5 cm) Weight: 233 lb 3.2 oz (105.779 kg) IBW/kg (Calculated) : 50.1  Vital Signs: Temp: 98.8 F (37.1 C) (07/13 0519) Temp Source: Oral (07/13 0519) BP: 139/52 mmHg (07/13 0519) Pulse Rate: 77 (07/13 0519)  Labs:  Recent Labs  03/13/15 1209  03/14/15 0640 03/15/15 0652 03/16/15 0550  HGB  --   < > 9.7* 9.3* 9.2*  HCT  --   --  31.0* 30.0* 29.8*  PLT  --   --  240 232 244  LABPROT  --   --  35.5* 35.3* 32.3*  INR  --   --  3.65* 3.63* 3.22*  CREATININE  --   --  1.59* 1.40* 1.25*  TROPONINI <0.03  --   --   --   --   < > = values in this interval not displayed. Estimated Creatinine Clearance: 46.5 mL/min (by C-G formula based on Cr of 1.25).  Medical History: Past Medical History  Diagnosis Date  . Arthritis   . Chronic atrial fibrillation     takes Pradaxa daily  . Aortic stenosis     Severe by echo August 2015  . Pneumonia 09/2014  . History of bronchitis 2015  . Joint pain   . Back pain     reason unknown  . Family history of adverse reaction to anesthesia     uncle with MH in the 60's,cousin in the 8070's with MH  . 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 COPD (chronic obstructive pulmonary disease)    Medications:  Prescriptions prior to admission  Medication Sig Dispense Refill Last Dose  . albuterol (PROAIR HFA) 108 (90 BASE) MCG/ACT inhaler Inhale 2 puffs into the lungs 4 (four) times  daily as needed for wheezing or shortness of breath.   03/12/2015 at Unknown time  . albuterol (PROVENTIL) (2.5 MG/3ML) 0.083% nebulizer solution Take 3 mLs (2.5 mg total) by nebulization every 4 (four) hours as needed for wheezing or shortness of breath. 75 mL 12 03/12/2015 at 1900  . amiodarone (PACERONE) 200 MG tablet Take 1 tablet (200 mg total) by mouth daily.   03/12/2015 at Unknown time  . cephALEXin (KEFLEX) 500 MG capsule Take 500 mg by mouth 4 (four) times daily. 10 day course starting on 03/01/2015   03/11/2015 at Unknown time  . potassium chloride SA (K-DUR,KLOR-CON) 20 MEQ tablet Take 1 tablet (20 mEq total) by mouth 2 (two) times daily.   03/12/2015 at Unknown time  . torsemide (DEMADEX) 20 MG tablet Take 2 tablets (40 mg total) by mouth daily. (Patient taking differently: Take 20 mg by mouth 2 (two) times daily. ) 90 tablet 3 03/12/2015 at Unknown time  . traMADol (ULTRAM) 50 MG tablet Take 1 tablet (50 mg total) by mouth every 6 (six) hours as needed (pain). 50 tablet 0 03/12/2015 at Unknown time  . warfarin (COUMADIN) 4 MG tablet Take 1 tablet (4 mg total) by mouth daily at 6 PM. As directed,  bases on INR (Patient taking differently: Take 4 mg by mouth daily at 6 PM. Takes one tablet (  total) Mondays through Fridays. *Takes  on Saturdays and Sundays*)   03/11/2015 at 1100a  . warfarin (COUMADIN) 5 MG tablet Take 5 mg by mouth 2 (two) times a week. Takes  on Saturdays and Sundays (*Takes  on all other days)   03/12/2015 at 1100  . amLODipine (NORVASC) 10 MG tablet Take 1 tablet (10 mg total) by mouth daily. (Patient not taking: Reported on 03/12/2015) 30 tablet  Taking  . budesonide (PULMICORT) 0.25 MG/2ML nebulizer solution Take 2 mLs (0.25 mg total) by nebulization every 6 (six) hours. (Patient not taking: Reported on 03/12/2015) 60 mL 12  at 1900  . chlorhexidine (PERIDEX) 0.12 % solution 15 mLs by Mouth Rinse route 2 (two) times daily. (Patient not taking: Reported on 03/12/2015) 120 mL 0 Taking   . food thickener (THICK IT) POWD As needed (Patient not taking: Reported on 03/12/2015)  0 Taking  . predniSONE (DELTASONE) 10 MG tablet Take 1 tablet (10 mg total) by mouth daily with breakfast. (Patient not taking: Reported on 03/12/2015)   Taking   Assessment: Continuation of Coumadin PTA - NO COUMADIN IN 4 DAYS DUE TO ELEVATED INR. Labs reviewed- INR still slightly elevated but trending down toward goal Anticipate home dosing regimen will need adjustment.  Goal of Therapy:  INR 2-3    Plan:  Coumadin 1/2 mg (0.5mg ) po today x 1 to discourage too rapid a decrease INR/PT daily  Valrie Hart A 03/16/2015,8:14 AM

## 2015-03-16 NOTE — Progress Notes (Signed)
ANTIBIOTIC CONSULT NOTE- Follow up  Pharmacy Consult for Vancomycin and Zosyn Indication: pneumonia  Allergies  Allergen Reactions  . Bee Venom Swelling  . Latex Rash  . Penicillins Rash   Patient Measurements: Height:  (157.5 cm) Weight: 233 lb 3.2 oz (105.779 kg) IBW/kg (Calculated) : 50.1  Vital Signs: Temp: 98.8 F (37.1 C) (07/13 0519) Temp Source: Oral (07/13 0519) BP: 139/52 mmHg (07/13 0519) Pulse Rate: 77 (07/13 0519)  Labs:  Recent Labs  03/14/15 0640 03/15/15 0652 03/16/15 0550  WBC 5.8 5.9 6.4  HGB 9.7* 9.3* 9.2*  PLT 240 232 244  CREATININE 1.59* 1.40* 1.25*   Estimated Creatinine Clearance: 46.5 mL/min (by C-G formula based on Cr of 1.25).   Recent Labs  03/16/15 0551  VANCOTROUGH 21*    Microbiology: Recent Results (from the past 720 hour(s))  Clostridium Difficile by PCR (not at [facility])     Status: None   Collection Time: 02/14/15 12:15 PM  Result Value Ref Range Status   C difficile by pcr NEGATIVE NEGATIVE Final  Culture, blood (routine x 2)     Status: None (Preliminary result)   Collection Time: 03/13/15  1:31 AM  Result Value Ref Range Status   Specimen Description BLOOD RIGHT HAND  Final   Special Requests BOTTLES DRAWN AEROBIC ONLY 7CC  Final   Culture NO GROWTH 2 DAYS  Final   Report Status PENDING  Incomplete  Culture, blood (routine x 2)     Status: None (Preliminary result)   Collection Time: 03/13/15  1:49 AM  Result Value Ref Range Status   Specimen Description LEFT ANTECUBITAL  Final   Special Requests BOTTLES DRAWN AEROBIC ONLY 7CC  Final   Culture NO GROWTH 2 DAYS  Final   Report Status PENDING  Incomplete   Medical History: Past Medical History  Diagnosis Date  . Arthritis   . Chronic atrial fibrillation     takes Pradaxa daily  . Aortic stenosis     Severe by echo August 2015  . Pneumonia 09/2014  . History of bronchitis 2015  . Joint pain   . Back pain     reason unknown  . Family history of adverse  reaction to anesthesia     uncle with MH in the 60's,cousin in the 22's with MH  . 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 COPD (chronic obstructive pulmonary disease)    Medications:  Scheduled:  . amiodarone  200 mg Oral Daily  . antiseptic oral rinse  7 mL Mouth Rinse BID  . bumetanide  1 mg Intravenous Q12H  . piperacillin-tazobactam (ZOSYN)  IV  3.375 g Intravenous Q8H  . potassium chloride SA  20 mEq Oral BID  . sodium chloride  3 mL Intravenous Q12H  . sodium chloride  3 mL Intravenous Q12H  . vancomycin  1,000 mg Intravenous Q24H  . Warfarin - Pharmacist Dosing Inpatient   Does not apply Q24H   Assessment: 74 yo female admitted for dyspnea, workup for CHF vs pneumonia. SCr has improved since admission.  Trough level is slightly above goal. Normalized ClCr ~ 29ml/min.  WBC normal.  Afebrile.  Blood cx pending.  Goal  of Therapy:  Vancomycin trough level 15-20 mcg/ml  Plan:   Continue Zosyn 3.375 GM IV every 8 hours  Reduce Vancomycin to 1000 mg IV every 24 hours starting today  Vancomycin trough level weekly or sooner if warranted  Monitor SCr 3 times weekly, labs, and c/s  Duration of therapy per MD, anticipate 8 days total  Wayland DenisHall, Jheremy Boger A, [facility] Center[facility]PH 03/16/2015,8:06 AM

## 2015-03-16 NOTE — Progress Notes (Signed)
PROGRESS NOTE  Cheyenne AdasRita Rung GLO:[id number]:[id number] DOB: 04-21-1941 DOA: 03/12/2015 PCP: Ernestine ConradBLUTH, [name], MD   HPI: Ms. Sandra JunesMerritt is a [age]yr old female, pleasant and cooperative admitted for shortness of breath and dyspnea. She has a past medical history significant for aortic valve replacement s/p May 2016 and aortic root enlargement, COPD, atrial fibrillation, and arthritis. She recently underwent aortic valve replacement and aortic root dilation in May 2016; her hospital stay was approximately one month. Upon discharge she was feeling well. Over the last three weeks she has experienced dyspnea and shortness of breath that became worse on Saturday evening prompting her to seek medical attention. She also complained of orthopnea and leg swelling. In ED, she was found to be anemic with hgb at 9.1, ARF with SCr at 1.88, and CXR with b/l airspace disease. She was admitted for further evaluation and treatment.    Subjective / 24 H Interval events Today, she states her breathing is better. She denies chest pain, palpitations, fever, chills, abdominal pain, nausea, or vomiting. Of note, she did not sleep in her hospital bed, but rather slept in recliner in the upright position.     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 This was d/c 7/10 and started on Bumex 1mg  - Troponins stable at 0.03; BNP was 178 - CXR: b/l airspace  disease - 2D echo pending COPD - On nasal cannula 2L/min. O2 sats are 100% - Denies shortness of breath today.  - Continue nasal cannula 2L/min. Continue nebulizer treatment PRN for symptoms. Monitor for symptoms.  Anemia - Hgb on admission was 9, likely in the setting of chronic disease - Serial CBCs show hgb fluctuating between 9-9.7 - Iron, TIBC, saturation, ferritin, and folate came back within normal limits - Vitamin B-12 was elevated - Heme occult x 3 ordered - Continue to monitor with CBC.  Acute on chronic renal failure, underlying CKD III - On admission serum creatinine was 1.8; BUN was 48 - Serum creatinine has trended down, last serum creatinine was 1.25, close to baseline now. Monitor while diuresing  Chronic Atrial fibrillation - On Coumadin per pharmacy   Diet:  Heart Fluids: NS DVT Prophylaxis: Coumadin  Code Status: Full Code Family Communication: self  Disposition Plan: remain inpatient  Consultants:  None  Procedures:  2d echo   Antibiotics Vancomycin 7/9 >> Zosyn 7/9 >> Levaquin 7/9 >>   Studies  No results found.  Objective  Filed Vitals:   03/15/15 2123 03/16/15 0102 03/16/15 0519 03/16/15 0814  BP: 123/51  139/52   Pulse: 68  77   Temp: 98.2 F (36.8 C)  98.8 F (37.1 C)   TempSrc: Oral  Oral   Resp: 20  20   Height:      Weight:   105.779 kg (233 lb 3.2 oz)   SpO2: 100% 97% 100% 100%    Intake/Output Summary (Last 24 hours) at  03/16/15 1224 Last data filed at 03/16/15 0900  Gross per 24 hour  Intake    630 ml  Output      0 ml  Net    630 ml   Filed Weights   03/14/15 0600 03/15/15 0620 03/16/15 0519  Weight: 106.278 kg (234 lb 4.8 oz) 105.96 kg (233 lb 9.6 oz) 105.779 kg (233 lb 3.2 oz)   Exam:  General: Sitting up right in chair with nasal cannula, alert and oriented, ill appearing, but not in acute distress.  HEENT: no scleral icterus, PERRL  Cardiovascular: RRR without MRG, 2+ peripheral pulses, 3+ pitting  edema noted b/l in extremities.   Respiratory: Accessory muscle use. Crackles heard in bases of lungs b/l  Abdomen: soft, non tender, BS +, no guarding  MSK/Extremities: no clubbing/cyanosis, no joint swelling  Skin: no rashes. Skin changes on lower extremities b/l most likely related to edema.   Neuro: non focal, strength 5/5 in all 4   Data Reviewed: Basic Metabolic Panel:  Recent Labs Lab 03/12/15 2156 03/13/15 0615 03/14/15 0640 03/15/15 0652 03/16/15 0550  NA 136 135 139 140 142  K 3.9 4.0 3.8 3.6 3.5  CL 93* 94* 96* 96* 98*  CO2 33* 31 33* 33* 36*  GLUCOSE 97 87 87 91 93  BUN 48* 45* 35* 28* 23*  CREATININE 1.88* 1.70* 1.59* 1.40* 1.25*  CALCIUM 8.5* 8.4* 8.6* 8.4* 8.6*   Liver Function Tests:  Recent Labs Lab 03/12/15 2156 03/13/15 0615  AST 27 23  ALT 27 25  ALKPHOS 142* 129*  BILITOT 0.7 1.2  PROT 6.8 6.2*  ALBUMIN 3.2* 2.9*   CBC:  Recent Labs Lab 03/12/15 2156 03/13/15 0615 03/14/15 0640 03/15/15 0652 03/16/15 0550  WBC 7.0 6.5 5.8 5.9 6.4  NEUTROABS 5.2  --   --   --   --   HGB 9.1* 9.0* 9.7* 9.3* 9.2*  HCT 28.8* 28.5* 31.0* 30.0* 29.8*  MCV 93.5 93.8 93.9 94.3 95.2  PLT 222 217 240 232 244   Cardiac Enzymes:  Recent Labs Lab 03/12/15 2156 03/13/15 0131 03/13/15 0615 03/13/15 1209  TROPONINI 0.03 <0.03 <0.03 <0.03   BNP (last 3 results)  Recent Labs  02/07/15 0520 02/12/15 0435 03/12/15 2156  BNP 204.8* 271.9* 178.0*    Recent Results (from the past 240 hour(s))  Culture, blood (routine x 2)     Status: None (Preliminary result)   Collection Time: 03/13/15  1:31 AM  Result Value Ref Range Status   Specimen Description BLOOD RIGHT HAND  Final   Special Requests BOTTLES DRAWN AEROBIC ONLY 7CC  Final   Culture NO GROWTH 3 DAYS  Final   Report Status PENDING  Incomplete  Culture, blood (routine x 2)     Status: None (Preliminary result)   Collection Time: 03/13/15  1:49 AM  Result Value Ref Range Status   Specimen  Description BLOOD LEFT ANTECUBITAL  Final   Special Requests BOTTLES DRAWN AEROBIC ONLY 7CC  Final   Culture NO GROWTH 3 DAYS  Final   Report Status PENDING  Incomplete     Scheduled Meds: . amiodarone  200 mg Oral Daily  . antiseptic oral rinse  7 mL Mouth Rinse BID  . bumetanide  1 mg Intravenous Q12H  . piperacillin-tazobactam (ZOSYN)  IV  3.375 g Intravenous Q8H  . potassium chloride SA  20 mEq Oral BID  . sodium chloride  3 mL Intravenous Q12H  . sodium chloride  3 mL Intravenous  Q12H  . vancomycin  1,000 mg Intravenous Q24H  . warfarin  0.5 mg Oral Once  . Warfarin - Pharmacist Dosing Inpatient   Does not apply Q24H    Continuous Infusions:   Arvilla Meres, Student - PA  Pamella Pert, MD [facility] Pager [phone]. If 7 PM - 7 AM, please contact night-coverage at [email or web address], password [email or web address] 03/16/2015, 12:24 PM  LOS: 4 days

## 2015-03-17 ENCOUNTER — Encounter (HOSPITAL_COMMUNITY): Payer: Self-pay | Admitting: Physician Assistant

## 2015-03-17 DIAGNOSIS — J9601 Acute respiratory failure with hypoxia: Secondary | ICD-10-CM

## 2015-03-17 DIAGNOSIS — I482 Chronic atrial fibrillation: Secondary | ICD-10-CM

## 2015-03-17 LAB — CBC
HCT: 31.1 % — ABNORMAL LOW (ref 36.0–46.0)
Hemoglobin: 9.7 g/dL — ABNORMAL LOW (ref 12.0–15.0)
MCH: 29.8 pg (ref 26.0–34.0)
MCHC: 31.2 g/dL (ref 30.0–36.0)
MCV: 95.4 fL (ref 78.0–100.0)
Platelets: 232 10*3/uL (ref 150–400)
RBC: 3.26 MIL/uL — ABNORMAL LOW (ref 3.87–5.11)
RDW: 16.1 % — ABNORMAL HIGH (ref 11.5–15.5)
WBC: 6.1 10*3/uL (ref 4.0–10.5)

## 2015-03-17 LAB — BASIC METABOLIC PANEL
Anion gap: 9 (ref 5–15)
BUN: 20 mg/dL (ref 6–20)
CALCIUM: 8.6 mg/dL — AB (ref 8.9–10.3)
CO2: 36 mmol/L — ABNORMAL HIGH (ref 22–32)
CREATININE: 1.27 mg/dL — AB (ref 0.44–1.00)
Chloride: 98 mmol/L — ABNORMAL LOW (ref 101–111)
GFR calc Af Amer: 48 mL/min — ABNORMAL LOW (ref >60)
GFR, EST NON AFRICAN AMERICAN: 41 mL/min — AB (ref >60)
Glucose, Bld: 107 mg/dL — ABNORMAL HIGH (ref 65–99)
Potassium: 3.9 mmol/L (ref 3.5–5.1)
Sodium: 143 mmol/L (ref 135–145)

## 2015-03-17 LAB — PROTIME-INR
INR: 2.89 — ABNORMAL HIGH (ref 0.00–1.49)
PROTHROMBIN TIME: 29.8 s — AB (ref 11.6–15.2)

## 2015-03-17 MED ORDER — LEVOFLOXACIN 500 MG PO TABS
500.0000 mg | ORAL_TABLET | Freq: Every day | ORAL | Status: AC
  Administered 2015-03-17 – 2015-03-19 (×3): 500 mg via ORAL
  Filled 2015-03-17 (×3): qty 1

## 2015-03-17 MED ORDER — FUROSEMIDE 10 MG/ML IJ SOLN
80.0000 mg | Freq: Two times a day (BID) | INTRAMUSCULAR | Status: DC
  Administered 2015-03-17 – 2015-03-20 (×6): 80 mg via INTRAVENOUS
  Filled 2015-03-17 (×6): qty 8

## 2015-03-17 MED ORDER — METOLAZONE 5 MG PO TABS
2.5000 mg | ORAL_TABLET | Freq: Once | ORAL | Status: AC
  Administered 2015-03-17: 2.5 mg via ORAL
  Filled 2015-03-17: qty 1

## 2015-03-17 MED ORDER — WARFARIN SODIUM 2 MG PO TABS
4.0000 mg | ORAL_TABLET | Freq: Once | ORAL | Status: AC
  Administered 2015-03-17: 4 mg via ORAL
  Filled 2015-03-17: qty 2

## 2015-03-17 MED ORDER — DILTIAZEM HCL 30 MG PO TABS
30.0000 mg | ORAL_TABLET | Freq: Two times a day (BID) | ORAL | Status: DC
  Administered 2015-03-17 – 2015-03-20 (×7): 30 mg via ORAL
  Filled 2015-03-17 (×7): qty 1

## 2015-03-17 NOTE — Consult Note (Signed)
Cardiology Consultation Note  Patient ID: Sandra Turner, MRN: [id number], DOB/AGE: 1941/03/27 74 y.o. Admit date: 03/12/2015   Date of Consult: 03/17/2015 Primary Physician: [name], MD Primary Cardiologist: [name]  Chief Complaint: SOB Reason for Consultation: CHF  HPI: Mr. Mensch is a 74 y/o female with aortic stenosis s/p bioprosthetic AVR 01/19/15 with MAZE procedure, obesity, chronic atrial fibrillation, moderate pulm HTN by cath 11/2014, normal coronaries by cath 11/2014, chronic diastolic CHF who presented to [facility] 03/12/15 with complaints of SOB and edema.  The patient underwent R/LHC in 11/2014 demonstrating severe AS and no significant CAD. She was seen by CVTS and had bioprosthetic AVR on 01/19/15 with prolonged and complicated hospitalization. Per discharge summary she went into AF with CVR post surgery and was placed on amiodarone, but she carries a history of chronic AF and EKGs prior to surgery all demonstrate AF. She also had acute respiratory failure requiring reintubation and removal of mucus plugs by bronchoscopy, and placement of bilateral chest tubes for pleural effusions. She also had CHF requiring diuresis and AKI with peak Cr 3.1. After extubation she developed swallowing difficulties and dysphagia and required Panda placed for tube feeds. She was felt to have residual left vocal cord paralysis. EP consult was contained on 6/13 for WCT later reinterpreted as atrial fib with motion artifact. She was discharged on Coumadin. At follow-up appointment in our office 03/03/15 she was found to be hypoxic and dyspneic but had not been taking Lasix as directed. She was started on Torsemide. CXR demonstrated a PNA that she was already being treated for. Sodium was low. INR was therapeutic. She presented back to the hospital (this time [facility]) with complaints of dyspnea, orthopnea, and leg swelling.She was found to have slightly worsened anemia (Hgb 10->9.1) and worsening renal insufficiency with  BUN/Cr 48/1.88. CXR showed bilateral asymmetric airspace disease to the left, probable small pleural effusions. She was admitted for CHF versus PNA and was placed on antibiotics and IV Lasix. This was changed to IV Bumex. While the patient had denied known weight gain, admit weight was recorded as 237 and discharge weight on 02/21/15 had been 223lb. She has diuresed 4lbs, down to 233, -2L. Creatinine has improved with diuresis. INR therapeutic. She remained hypoxic with ambulation as of yesterday, down to 86% with exertion. CXR yesterday with mild CHF and small bilateral effusions without significant change. She says she feels better but still appears dyspneic on exam. She continues to have edema - says he legs usually go down about an inch smaller around than they usually.  2D echo 03/15/14: EF 55-60%, mild LVH, mild MR, severe LAE/RAE, mod dilated RV with mod-severely reduced RV function, RV TAPSE 1.0, mild-mod MR, PASP , large pleural effusion, poorly visualized aortic valve but no stenosis or regurg, technically difficult study.  Past Medical History  Diagnosis Date  . Arthritis   . Chronic atrial fibrillation     a. previously on Pradaxa - on Coumadin since valve surgery.  . Aortic stenosis     a. Severe - s/p pericardial AVR with MAZE 02/04/15.  Marland Kitchen Pneumonia 09/2014  . History of bronchitis 2015  . Joint pain   . Back pain     reason unknown  . Family history of adverse reaction to anesthesia     uncle with MH in the 60's,cousin in the 28's with MH  . Malignant hyperthermia     Patient without known history (no testing, no surgeries prior to 01/17/15), but reported biopsy proven  MH in aunts/uncles/first cousins  . S/P aortic valve replacement with bioprosthetic valve and aortic root enlargement 01/19/2015    23 mm [facility] Ease bovine pericardial tissue valve with bovine pericardial patch enlargement of the aortic root  . COPD (chronic obstructive pulmonary disease)   . Normal  coronary arteries     a. By cath 11/2014.  . Pulmonary hypertension     a. Mod by cath 11/2014.  Marland Kitchen Chronic diastolic CHF (congestive heart failure)   . Vocal cord paralysis     a. Dx post-AVR in 01/2015. Required PANDA, intubation during that admission.  . Pleural effusion   . AKI (acute kidney injury)     a. H/o AKI after AVR in 01/2015.  . Tracheomalacia   . Anemia     a. 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      Most Recent Cardiac Studies: 03/16/15 2D Echo - Left ventricle: The cavity size was normal. Wall thickness was increased in a pattern of mild LVH. Systolic function was normal. The estimated ejection fraction was in the range of 55% to 60%. - Mitral valve: Mildly calcified annulus. Normal thickness leaflets . There was mild regurgitation. - Left atrium: The atrium was severely dilated. - Right ventricle: The cavity size was moderately dilated. It shares the apex with the LV. Systolic function was moderately to severely reduced. RV TAPSE is 1.0 cm. Tricuspid anular systolic tissue velocity is 7 cm/s. - Right atrium: The atrium was severely dilated. - Tricuspid valve: There was mild-moderate regurgitation. - Pulmonary arteries: Systolic pressure was moderately increased. PA peak pressure: 44 mm Hg (S). - Pericardium, extracardiac: There is a large left pleural effusion. - Technically difficult study.   Surgical History:  Past Surgical History  Procedure Laterality Date  . None    . Left and right heart catheterization with coronary angiogram N/A 11/01/2014    Procedure: LEFT AND RIGHT HEART CATHETERIZATION WITH CORONARY ANGIOGRAM;  Surgeon: [name], MD;  Location: [facility] CATH LAB;  Service: Cardiovascular;  Laterality: N/A;  . Aortic valve replacement N/A 01/19/2015    Procedure: AORTIC VALVE REPLACEMENT (AVR);  Surgeon: Purcell Nails, MD;  Location[facility] OR;  Service: Open Heart Surgery;  Laterality: N/A;  . Maze N/A 01/19/2015    Procedure: MAZE;   Surgeon: Purcell Nails, MD;  Location: [facility] OR;  Service: Open Heart Surgery;  Laterality: N/A;  . Aortic root enlargement N/A 01/19/2015    Procedure:  AORTIC ROOT ENLARGEMENT;  Surgeon: Purcell Nails, MD;  Location: [facility] OR;  Service: Open Heart Surgery;  Laterality: N/A;  . Tee without cardioversion N/A 01/19/2015    Procedure: TRANSESOPHAGEAL ECHOCARDIOGRAM (TEE);  Surgeon: Purcell Nails, MD;  Location: [facility] OR;  Service: Open Heart Surgery;  Laterality: N/A;     Home Meds: Prior to Admission medications   Medication Sig Start Date End Date Taking? Authorizing Provider  albuterol (PROAIR HFA) 108 (90 BASE) MCG/ACT inhaler Inhale 2 puffs into the lungs 4 (four) times daily as needed for wheezing or shortness of breath.   Yes Historical Provider, MD  albuterol (PROVENTIL) (2.5 MG/3ML) 0.083% nebulizer solution Take 3 mLs (2.5 mg total) by nebulization every 4 (four) hours as needed for wheezing or shortness of breath. 09/15/14  Yes Erick Blinks, MD  amiodarone (PACERONE) 200 MG tablet Take 1 tablet (200 mg total) by mouth daily. 02/21/15  Yes Wayne E Gold, PA-C  cephALEXin (KEFLEX) 500 MG capsule Take 500 mg by mouth 4 (four) times daily.  10 day course starting on 03/01/2015   Yes Historical Provider, MD  potassium chloride SA (K-DUR,KLOR-CON) 20 MEQ tablet Take 1 tablet (20 mEq total) by mouth 2 (two) times daily. 02/21/15  Yes Wayne E Gold, PA-C  torsemide (DEMADEX) 20 MG tablet Take 2 tablets (40 mg total) by mouth daily. Patient taking differently: Take 20 mg by mouth 2 (two) times daily.  03/03/15  Yes Jodelle Gross, NP  traMADol (ULTRAM) 50 MG tablet Take 1 tablet (50 mg total) by mouth every 6 (six) hours as needed (pain). 02/21/15  Yes Wayne E Gold, PA-C  warfarin (COUMADIN) 4 MG tablet Take 1 tablet (4 mg total) by mouth daily at 6 PM. As directed, bases on INR Patient taking differently: Take 4 mg by mouth daily at 6 PM. Takes one tablet (4mg  total) Mondays through Fridays. *Takes 5mg   on Saturdays and Sundays* 02/21/15  Yes Wayne E Gold, PA-C  warfarin (COUMADIN) 5 MG tablet Take 5 mg by mouth 2 (two) times a week. Takes 5mg  on Saturdays and Sundays (*Takes 4mg  on all other days)   Yes Historical Provider, MD  amLODipine (NORVASC) 10 MG tablet Take 1 tablet (10 mg total) by mouth daily. Patient not taking: Reported on 03/12/2015 02/21/15   Glenice Laine Gold, PA-C  budesonide (PULMICORT) 0.25 MG/2ML nebulizer solution Take 2 mLs (0.25 mg total) by nebulization every 6 (six) hours. Patient not taking: Reported on 03/12/2015 02/21/15   Rowe Clack, PA-C  chlorhexidine (PERIDEX) 0.12 % solution 15 mLs by Mouth Rinse route 2 (two) times daily. Patient not taking: Reported on 03/12/2015 02/21/15   Rowe Clack, PA-C  food thickener [facility] IT) POWD As needed Patient not taking: Reported on 03/12/2015 02/21/15   Rowe Clack, PA-C  predniSONE (DELTASONE) 10 MG tablet Take 1 tablet (10 mg total) by mouth daily with breakfast. Patient not taking: Reported on 03/12/2015 02/21/15   Rowe Clack, PA-C    Inpatient Medications:  . amiodarone  200 mg Oral Daily  . antiseptic oral rinse  7 mL Mouth Rinse BID  . bumetanide  1 mg Intravenous Q12H  . piperacillin-tazobactam (ZOSYN)  IV  3.375 g Intravenous Q8H  . potassium chloride SA  20 mEq Oral BID  . sodium chloride  3 mL Intravenous Q12H  . sodium chloride  3 mL Intravenous Q12H  . vancomycin  1,000 mg Intravenous Q24H  . Warfarin - Pharmacist Dosing Inpatient   Does not apply Q24H      Allergies:  Allergies  Allergen Reactions  . Bee Venom Swelling  . Latex Rash  . Penicillins Rash    History   Social History  . Marital Status: Divorced    Spouse Name: N/A  . Number of Children: 0  . Years of Education: N/A   Occupational History  . Hairdresser    Social History Main Topics  . Smoking status: Never Smoker   . Smokeless tobacco: Not on file  . Alcohol Use: No  . Drug Use: No  . Sexual Activity: No   Other Topics Concern  . Not  on file   Social History Narrative     Family History  Problem Relation Age of Onset  . Breast cancer Mother   . Congestive Heart Failure Mother   . Cancer Father     ? Lymphoma  . Malignant hyperthermia Cousin      Review of Systems: no BRBPR, melena, hematemesis.  All other systems reviewed and are otherwise negative  except as noted above.  Labs:  Lab Results  Component Value Date   WBC 6.1 03/17/2015   HGB 9.7* 03/17/2015   HCT 31.1* 03/17/2015   MCV 95.4 03/17/2015   PLT 232 03/17/2015    Recent Labs Lab 03/13/15 0615  03/17/15 0643  NA 135  < > 143  K 4.0  < > 3.9  CL 94*  < > 98*  CO2 31  < > 36*  BUN 45*  < > 20  CREATININE 1.70*  < > 1.27*  CALCIUM 8.4*  < > 8.6*  PROT 6.2*  --   --   BILITOT 1.2  --   --   ALKPHOS 129*  --   --   ALT 25  --   --   AST 23  --   --   GLUCOSE 87  < > 107*  < > = values in this interval not displayed. No results found for: CHOL, HDL, LDLCALC, TRIG   Radiology/Studies:  Dg Chest 2 View  03/16/2015   CLINICAL DATA:  Shortness of breath and cough for 3 weeks. Pleural effusions. Atrial fibrillation. COPD.  EXAM: CHEST  2 VIEW  COMPARISON:  03/12/2015  FINDINGS: Patient has undergone previous aortic valve replacement. Mild cardiomegaly stable. Diffuse pulmonary vascular congestion and interstitial prominence is suspicious for mild interstitial edema. Infiltrate or atelectasis is seen in the lingula, without significant change. Small bilateral pleural effusions appears stable.  IMPRESSION: Mild congestive heart failure and small bilateral pleural effusions, without significant change.  Stable lingular infiltrate versus atelectasis.   Electronically Signed   By: Myles Rosenthal M.D.   On: 03/16/2015 19:36   Dg Chest 2 View  03/03/2015   CLINICAL DATA:  74 year old female with aortic valve replacement, history of pneumonia and atrial fibrillation.  EXAM: CHEST  2 VIEW  COMPARISON:  Chest radiograph dated 02/15/2015  FINDINGS: Single-view  of the chest few droplets demonstrate an area increased density the left lower lung field likely atelectatic changes. Superimposed pneumonia not excluded. There are small bilateral pleural effusions, decreased compared to the prior study. There is stable cardiomegaly. Median sternotomy wires, mechanical heart valve and left atrial appendage indication again noted. The osseous structures are otherwise unremarkable.  IMPRESSION: Small bilateral pleural effusions, decreased compared to prior studies.  Left lower lung zone atelectasis/pneumonia. Clinical correlation and follow-up recommended.   Electronically Signed   By: Elgie Collard M.D.   On: 03/03/2015 15:58   US Renal  03/13/2015   CLINICAL DATA:  74 year old female with acute renal failure.  EXAM: RENAL / URINARY TRACT ULTRASOUND COMPLETE  COMPARISON:  None.  FINDINGS: Right Kidney:  Length: 10.6 cm. Echogenicity is upper limits of normal. No mass or hydronephrosis visualized.  Left Kidney:  Length: 10.5 cm. Echogenicity is upper limits of normal. No mass or hydronephrosis visualized.  Bladder:  A Foley catheter is identified and a collapsed bladder.  IMPRESSION: Upper limits of normal renal echogenicity may represent medical renal disease.  No evidence of hydronephrosis.   Electronically Signed   By: Harmon Pier M.D.   On: 03/13/2015 12:37   Dg Chest Port 1 View  03/12/2015   CLINICAL DATA:  Dyspnea  EXAM: PORTABLE CHEST - 1 VIEW  COMPARISON:  03/03/2015  FINDINGS: Bilateral airspace disease which is asymmetric to the left. Probable small pleural effusions.  Stable cardiopericardial enlargement in this patient post aortic valve replacement and left atrial exclusion. Blood flow is cephalized in the vascular pedicle is widened.  IMPRESSION: Bilateral airspace disease which could reflect pneumonia or asymmetric edema.   Electronically Signed   By: Marnee Spring M.D.   On: 03/12/2015 21:51   Dg Swallowing Func-speech Pathology  02/17/2015    Objective  Swallowing Evaluation:    Patient Details  Name: Allegra Cerniglia MRN: [id number] Date of Birth: 05-Sep-1940  Today's Date: 02/17/2015 Time: SLP Start Time (ACUTE ONLY): 0955-SLP Stop Time (ACUTE ONLY): 1016 SLP Time Calculation (min) (ACUTE ONLY): 21 min  Past Medical History:  Past Medical History  Diagnosis Date  . Arthritis   . Chronic atrial fibrillation     takes Pradaxa daily  . Aortic stenosis     Severe by echo August 2015  . Pneumonia 09/2014  . History of bronchitis 2015  . Joint pain   . Back pain     reason unknown  . Family history of adverse reaction to anesthesia     uncle with MH in the 60's,cousin in the 42's with MH  . Malignant hyperthermia     Patient without known history (no testing, no surgeries prior to  01/17/15), but reported biopsy proven MH in aunts/uncles/first cousins  . S/P aortic valve replacement with bioprosthetic valve and aortic root  enlargement 01/19/2015    23 mm [facility] Ease bovine pericardial tissue valve with bovine  pericardial patch enlargement of the aortic root   Past Surgical History:  Past Surgical History  Procedure Laterality Date  . None    . Left and right heart catheterization with coronary angiogram N/A  11/01/2014    Procedure: LEFT AND RIGHT HEART CATHETERIZATION WITH CORONARY ANGIOGRAM;   Surgeon: [name], MD;  Location: [facility] CATH LAB;  Service:  Cardiovascular;  Laterality: N/A;  . Aortic valve replacement N/A 01/19/2015    Procedure: AORTIC VALVE REPLACEMENT (AVR);  Surgeon: Purcell Nails,  MD;  Location: [facility] OR;  Service: Open Heart Surgery;  Laterality: N/A;  . Maze N/A 01/19/2015    Procedure: MAZE;  Surgeon: Purcell Nails, MD;  Location: [facility] OR;   Service: Open Heart Surgery;  Laterality: N/A;  . Aortic root enlargement N/A 01/19/2015    Procedure:  AORTIC ROOT ENLARGEMENT;  Surgeon: Purcell Nails, MD;   Location: [facility] OR;  Service: Open Heart Surgery;  Laterality: N/A;  . Tee without cardioversion N/A 01/19/2015    Procedure: TRANSESOPHAGEAL  ECHOCARDIOGRAM (TEE);  Surgeon: Purcell Nails, MD;  Location: [facility] OR;  Service: Open Heart Surgery;  Laterality:  N/A;   HPI:  Other Pertinent Information: Pt is a 74 year old female who underwent AVR  on 5/18. Developed VDRF with hypoxemic respiratory failure likely due to  HCAP, severe atelectasis w/ mucous plugging, and bilateral pleural  effusions complicating chronic respiratory failure due to tracheomalacia,  chronic diastolic CHF, morbid obesity, OSA, COPD and pulmonary  hypertension.Intubated from 5/18-6/6. Bronch on 6/6 showed significant  tracheomalacia and acute inflammation and thick, glue like secretions.MD  concerned for an allergic bronchopulmonary aspergillosis like syndrome.   No Data Recorded  Assessment / Plan / Recommendation CHL IP CLINICAL IMPRESSIONS 02/17/2015  Therapy Diagnosis Moderate pharyngeal phase dysphagia  Clinical Impression Pts swallow function has improved since last MBS,  particularly with generalized strength, aiding pt in compensation for  persistent incomplete airway closure. With thin and nectar thick liquids  the pt penetrates and aspirates during the swallow, though sensation of  aspirate has improved with pt consistently attempting to eject aspirate,  though not always successful. A chin  tuck (or head turn) does not  eliminate penetrate with thin and nectar, though a chin tuck is successful  with solids, puree and honey. Recommend pt initiate a Dys 3 (mechanical  soft) diet with honey thick liquids with a chin tuck with every swallow.  Discussed with TCTS PA who plans to keep Panda in place until pt  demonstrates tolerance.       CHL IP TREATMENT RECOMMENDATION 02/17/2015  Treatment Recommendations Therapy as outlined in treatment plan below     CHL IP DIET RECOMMENDATION 02/17/2015  SLP Diet Recommendations Dysphagia 3 (Mech soft);Honey  Liquid Administration via (None)  Medication Administration Crushed with puree  Compensations Chin tuck;Clear throat  intermittently;Small sips/bites  Postural Changes and/or Swallow Maneuvers (None)     CHL IP OTHER RECOMMENDATIONS 02/17/2015  Recommended Consults (None)  Oral Care Recommendations Oral care BID  Other Recommendations Order thickener from pharmacy     No flowsheet data found.   CHL IP FREQUENCY AND DURATION 02/17/2015  Speech Therapy Frequency (ACUTE ONLY) min 2x/week  Treatment Duration 2 weeks     Pertinent Vitals/Pain NA    SLP Swallow Goals No flowsheet data found.  No flowsheet data found.    CHL IP REASON FOR REFERRAL 02/17/2015  Reason for Referral Objectively evaluate swallowing function     CHL IP ORAL PHASE 02/17/2015  Lips (None)  Tongue (None)  Mucous membranes (None)  Nutritional status (None)  Other (None)  Oxygen therapy (None)  Oral Phase WFL  Oral - Pudding Teaspoon (None)  Oral - Pudding Cup (None)  Oral - Honey Teaspoon (None)  Oral - Honey Cup (None)  Oral - Honey Syringe (None)  Oral - Nectar Teaspoon (None)  Oral - Nectar Cup (None)  Oral - Nectar Straw (None)  Oral - Nectar Syringe (None)  Oral - Ice Chips (None)  Oral - Thin Teaspoon (None)  Oral - Thin Cup (None)  Oral - Thin Straw (None)  Oral - Thin Syringe (None)  Oral - Puree (None)  Oral - Mechanical Soft (None)  Oral - Regular (None)  Oral - Multi-consistency (None)  Oral - Pill (None)  Oral Phase - Comment (None)      CHL IP PHARYNGEAL PHASE 02/17/2015  Pharyngeal Phase Impaired  Pharyngeal - Pudding Teaspoon (None)  Penetration/Aspiration details (pudding teaspoon) (None)  Pharyngeal - Pudding Cup (None)  Penetration/Aspiration details (pudding cup) (None)  Pharyngeal - Honey Teaspoon (None)  Penetration/Aspiration details (honey teaspoon) (None)  Pharyngeal - Honey Cup (None)  Penetration/Aspiration details (honey cup) (None)  Pharyngeal - Honey Syringe (None)  Penetration/Aspiration details (honey syringe) (None)  Pharyngeal - Nectar Teaspoon (None)  Penetration/Aspiration details (nectar teaspoon) (None)  Pharyngeal - Nectar Cup  (None)  Penetration/Aspiration details (nectar cup) (None)  Pharyngeal - Nectar Straw (None)  Penetration/Aspiration details (nectar straw) (None)  Pharyngeal - Nectar Syringe (None)  Penetration/Aspiration details (nectar syringe) (None)  Pharyngeal - Ice Chips (None)  Penetration/Aspiration details (ice chips) (None)  Pharyngeal - Thin Teaspoon (None)  Penetration/Aspiration details (thin teaspoon) (None)  Pharyngeal - Thin Cup (None)  Penetration/Aspiration details (thin cup) (None)  Pharyngeal - Thin Straw (None)  Penetration/Aspiration details (thin straw) (None)  Pharyngeal - Thin Syringe (None)  Penetration/Aspiration details (thin syringe') (None)  Pharyngeal - Puree (None)  Penetration/Aspiration details (puree) (None)  Pharyngeal - Mechanical Soft (None)  Penetration/Aspiration details (mechanical soft) (None)  Pharyngeal - Regular (None)  Penetration/Aspiration details (regular) (None)  Pharyngeal - Multi-consistency (None)  Penetration/Aspiration details (multi-consistency) (None)  Pharyngeal -  Pill (None)  Penetration/Aspiration details (pill) (None)  Pharyngeal Comment (None)      CHL IP CERVICAL ESOPHAGEAL PHASE 02/17/2015  Cervical Esophageal Phase WFL  Pudding Teaspoon (None)  Pudding Cup (None)  Honey Teaspoon (None)  Honey Cup (None)  Honey Straw (None)  Nectar Teaspoon (None)  Nectar Cup (None)  Nectar Straw (None)  Nectar Sippy Cup (None)  Thin Teaspoon (None)  Thin Cup (None)  Thin Straw (None)  Thin Sippy Cup (None)  Cervical Esophageal Comment (None)    No flowsheet data found.        Harlon DittyBonnie DeBlois, MA CCC-SLP [phone]  Claudine MoutonDeBlois, Bonnie Caroline 02/17/2015, 10:53 AM     Wt Readings from Last 3 Encounters:  03/17/15 233 lb 12.8 oz (106.051 kg)  03/03/15 233 lb (105.688 kg)  02/21/15 223 lb 11.2 oz (101.47 kg)   EKG: atrial fib 66bpm, LAFB, poor R wave progression  Physical Exam: Blood pressure 118/70, pulse 77, temperature 98.6 F (37 C), temperature source Oral, resp. rate 20,  height 5\' 2"  (1.575 m), weight 233 lb 12.8 oz (106.051 kg), SpO2 95 %. General: Well developed obese WF. No acute distress but moderately increased RR. I woke her out of sleep. Head: Normocephalic, atraumatic, sclera non-icteric, no xanthomas, nares are without discharge.  Neck: Negative for carotid bruits. JVD mod elevated. Lungs: Lung sounds decreased bilaterally - at base on the right, and 1/3 up on the left. No wheezes or rales. Using some accessory muscles to belly-breathe. Heart: Irregularly irregular, rate controlled, with S1 S2. 3/6 SEM throughout whole precordium. Abdomen: Soft, non-tender, non-distended with normoactive bowel sounds. No hepatomegaly. No rebound/guarding. No obvious abdominal masses. Msk:  Strength and tone appear normal for age. Extremities: No clubbing or cyanosis. 2+ LEE edema with venous stasis changes.  Distal pedal pulses difficult given edema. Neuro: Alert and oriented X 3. No facial asymmetry. No focal deficit. Moves all extremities spontaneously. Psych:  Responds to questions appropriately with a normal affect.    Assessment and Plan:   1. Recurrent acute hypoxic respiratory failure with pleural effusions - likely due to CHF +/- diminished reserve from COPD. See below. Will need eval for home O2 prior to dc.  2. Acute on chronic diastolic CHF - weight is still up about 10lbs from hospital discharge last month. Not really moving much volume on the current regimen of Bumex. Will discuss escalation of Lasix with MD. She required metolazone last admission but I am not sure I would start with this given her recent renal issues.  3. Chronic atrial fibrillation, on Coumadin - this is a chronic diagnosis. I do not know why she needs to be on amiodarone. This was started for post-op AF last admission but she is in chronic AF. Not the best choice long-term for her given underlying COPD.   4. AS s/p bioprosthetic AVR 01/2015 - not well seen on echo this admission but no  significant AS/AI noted on study. Significant murmur on exam. Will ask MD to review echo.  5. Recent AKI, improving - follow with diuresis.  6. ABL anemia post-surgery - appearing stable.   Thomasene MohairSigned, Dayna, Dunn PA-C 03/17/2015, 9:58 AM Pager: [phone]  Patient seen and discussed with PA Dunn, I agree with her documentation above. 74 yo female history of chronic afib s/p MAZE procedure with recurrence, COPD, Pulm HTN, bioprosthetic AVR 01/2015 with Maze procedure, left vocal cord paralysis admitted with SOB.  Discharge weight June 17: 225 lbs. Wt June 30 233 lbs, 03/12/15 Admit weight  233 lbs BNP 178, trop neg x 3, Cr 1.27, BUN 20, GFR 41, WBC 6.1, Hgb 9.7, Plt 232, INR 2.89 CXR mild CHF with small bilateral effusion, stable lingular infiltrate vs atelectasis Echo LVEF 55-60%, normal functioning AVR, moderately dilated RV with mod to severely reduced function, PASP 44, large left pleural effusion, indeterminate diastolic function. EKG poor tracing, afib, old anteroseptal Q waves  She has been managed for acute on chronic diastolic heart failure. Negative 2 liters since admission, Cr and BUN have been trending down since admission consistent with venous congestion and CHF. She is on bumex 1mg  IV bid (from pharm standpoint equivalent to 40 of IV lasix) with fairly limited diuresis. Will d/c IV bumex and try higher dose lasix (appears she was switched after failing lasix IV 40mg ), start lasix 80mg  IV bid, with dose of metolazone 2.5mg  today. Echo shows normal LVEF and AVR function, she has RV enlargement and dysfunction that appears similar to prior echos though reports do not indicate this. Pulm HTN is chronic and likely related to elevated left sided filling pressures and COPD. I agree with PA Dunn not much use in continued amiodarone given her chronic afib and current failure of therapy, along with risks of side effects especially given her pulmonary disease. Stop amio, start dilt 30mg  bid.    Dominga Ferry MD

## 2015-03-17 NOTE — Care Management Note (Signed)
Case Management Note  Patient Details  Name: Sandra Turner MRN: [id number] Date of Birth: 29-Nov-1940  Subjective/Objective:                    Action/Plan:   Expected Discharge Date:                  Expected Discharge Plan:  Home w Home Health Services  In-House Referral:  NA  Discharge planning Services  CM Consult  Post Acute Care Choice:  Resumption of Svcs/PTA Provider Choice offered to:  Patient  DME Arranged:    DME Agency:     HH Arranged:  RN, PT HH Agency:  [facility]  Status of Service:  In process, will continue to follow  Medicare Important Message Given:  Yes-second notification given Date Medicare IM Given:    Medicare IM give by:    Date Additional Medicare IM Given:    Additional Medicare Important Message give by:     If discussed at Long Length of Stay Meetings, dates discussed:03/17/15    Additional Comments:  Cheryl FlashBlackwell, Benaiah Behan Crowder, RN 03/17/2015, 3:57 PM

## 2015-03-17 NOTE — Progress Notes (Signed)
PROGRESS NOTE  Quetzalli Clos ZOX:[id number] DOB: [birth year] DOA: 03/12/2015 PCP: [name], MD  HPI: Patient is a pleasant and cooperative 74yr old female admitted 03/12/15 for progressive worsening shortness of breath and dyspnea x 3 weeks. She has a past medical history significant for aortic valve replacement and aortic root enlargment s/p May 2016, COPD, atrial fibrillation, and arthritis. In ED, she was found to be anemic with hgb at 9.1, ARF with SCr at 1.88, and CXR with b/l airspace disease.    Subjective / 24 H Interval events Patient states she is feeling good today with no new complaints. She slept upright in her chair last night. Reports improvement in shortness of breath. She is voiding; had a BM yesterday, has not had one yet today. She ambulates in her room. Endorses a good appetite. Denies fever, chills, nightsweats, chest pain, dypsnea, abdominal pain, nausea, or vomiting.   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  - 2D echo with "large pleural effusion", repeat CXR showing only small effusions c/w CHF - CXR: b/l airspace disease - diuresing with 80 IV Lasix BID and metolazone  COPD - On nasal cannula 2L/min. O2 sats today are 95%  - stable dyspnea - Continue nasal cannula 2L/min. Continue nebulizer treatment PRN for symptoms. Monitor for  symptoms.   Anemia - Hgb on admission was 9, likely in the setting of chronic disease - Stable  Acute on chronic renal failure, underlying CKD III - On admission serum creatinine was 1.8; BUN was 48 - improving with initial diuresis, closely monitor now since diuretics increased today   Chronic Atrial fibrillation - d/c amiodarone per cardiology; start diltiazem  BID - On Coumadin per pharmacy, continue monitoring  S/p Aortic valve replacement and aortic root dilation - 2d echo: EF 55-60%, decreased right ventricular function, dilated right and left atrium  - Cardiology consult - d/c amiodarone - Continue coumadin therapy   Diet:  heart Fluids: NS DVT Prophylaxis: Coumadin  Code Status: Full Code Family Communication: self Disposition Plan: remain inpatient  Consultants:  Cardiology  Procedures:  2decho   Antibiotics - Vancomycin 7/09>> 7/14 - Piperacillin-tazobactam 7/09 >> 7/14 - Levofloxacin 7/10>>7/10 . 7/14 >> (restart)   Studies  Dg Chest 2 View  03/16/2015   CLINICAL DATA:  Shortness of breath and cough for 3 weeks. Pleural effusions. Atrial fibrillation. COPD.  EXAM: CHEST  2 VIEW  COMPARISON:  03/12/2015  FINDINGS: Patient has undergone previous aortic valve replacement. Mild cardiomegaly stable. Diffuse pulmonary vascular congestion and interstitial prominence is suspicious for mild interstitial edema. Infiltrate or atelectasis is seen in the lingula, without significant change. Small bilateral pleural effusions appears stable.  IMPRESSION: Mild congestive heart failure and small bilateral pleural effusions, without significant change.  Stable lingular infiltrate versus atelectasis.   Electronically Signed   By: Myles Rosenthal M.D.   On: 03/16/2015  19:36    Objective  Filed Vitals:   03/16/15 1638 03/16/15 2110 03/16/15 2119 03/17/15 0600  BP: 95/72 129/61  118/70  Pulse: 77 69  77  Temp: 98.4 F (36.9 C) 98.5 F (36.9 C)  98.6 F (37 C)    TempSrc: Oral Oral  Oral  Resp: 20 20  20   Height:      Weight:    106.051 kg (233 lb 12.8 oz)  SpO2: 98% 96% 95% 95%    Intake/Output Summary (Last 24 hours) at 03/17/15 1223 Last data filed at 03/17/15 0900  Gross per 24 hour  Intake    900 ml  Output   1600 ml  Net   -700 ml   Filed Weights   03/15/15 0620 03/16/15 0519 03/17/15 0600  Weight: 105.96 kg (233 lb 9.6 oz) 105.779 kg (233 lb 3.2 oz) 106.051 kg (233 lb 12.8 oz)    Exam:  General:  Pleasant 74yr old female sitting upright in chair with nasal cannula watchingTV. She is alert and oriented. She is in no acute distress, but of note she has accessory muscle use for respiration and can only speak a few words before needing to take a deep breath.   HEENT: no scleral icterus  Cardiovascular: Irregular irregular rate and rhythm. Systolic ejection murmur heard. LE peripheral pulses not appreciated due to swelling. 3+ pitting edema in BLLE. 2+ radial pulse  Respiratory: accessory muscle use. Crackles heard in bases of lungs.   Abdomen: soft, non tender, BS +, no guarding  MSK/Extremities: no clubbing/cyanosis, no joint swelling  Skin: no rashes, skin changes BLLE most likely associated with swelling.    Neuro: moves all 4  Data Reviewed: Basic Metabolic Panel:  Recent Labs Lab 03/13/15 0615 03/14/15 0640 03/15/15 0652 03/16/15 0550 03/17/15 0643  NA 135 139 140 142 143  K 4.0 3.8 3.6 3.5 3.9  CL 94* 96* 96* 98* 98*  CO2 31 33* 33* 36* 36*  GLUCOSE 87 87 91 93 107*  BUN 45* 35* 28* 23* 20  CREATININE 1.70* 1.59* 1.40* 1.25* 1.27*  CALCIUM 8.4* 8.6* 8.4* 8.6* 8.6*   Liver Function Tests:  Recent Labs Lab 03/12/15 2156 03/13/15 0615  AST 27 23  ALT 27 25  ALKPHOS 142* 129*  BILITOT 0.7 1.2  PROT 6.8 6.2*  ALBUMIN 3.2* 2.9*   CBC:  Recent Labs Lab 03/12/15 2156 03/13/15 0615 03/14/15 0640 03/15/15 0652 03/16/15 0550 03/17/15 0643  WBC 7.0 6.5 5.8 5.9 6.4 6.1  NEUTROABS 5.2  --   --   --    --   --   HGB 9.1* 9.0* 9.7* 9.3* 9.2* 9.7*  HCT 28.8* 28.5* 31.0* 30.0* 29.8* 31.1*  MCV 93.5 93.8 93.9 94.3 95.2 95.4  PLT 222 217 240 232 244 232   Cardiac Enzymes:  Recent Labs Lab 03/12/15 2156 03/13/15 0131 03/13/15 0615 03/13/15 1209  TROPONINI 0.03 <0.03 <0.03 <0.03   BNP (last 3 results)  Recent Labs  02/07/15 0520 02/12/15 0435 03/12/15 2156  BNP 204.8* 271.9* 178.0*    Recent Results (from the past 240 hour(s))  Culture, blood (routine x 2)     Status: None (Preliminary result)   Collection Time: 03/13/15  1:31 AM  Result Value Ref Range Status   Specimen Description BLOOD RIGHT HAND  Final   Special Requests BOTTLES DRAWN AEROBIC ONLY 7CC  Final   Culture NO GROWTH 4 DAYS  Final   Report Status PENDING  Incomplete  Culture, blood (  routine x 2)     Status: None (Preliminary result)   Collection Time: 03/13/15  1:49 AM  Result Value Ref Range Status   Specimen Description BLOOD LEFT ANTECUBITAL  Final   Special Requests BOTTLES DRAWN AEROBIC ONLY 7CC  Final   Culture NO GROWTH 4 DAYS  Final   Report Status PENDING  Incomplete     Scheduled Meds: . antiseptic oral rinse  7 mL Mouth Rinse BID  . diltiazem  30 mg Oral BID  . furosemide  80 mg Intravenous BID  . levofloxacin  500 mg Oral Daily  . metolazone  2.5 mg Oral Once  . potassium chloride SA  20 mEq Oral BID  . sodium chloride  3 mL Intravenous Q12H  . sodium chloride  3 mL Intravenous Q12H  . Warfarin - Pharmacist Dosing Inpatient   Does not apply Q24H   Continuous Infusions:   Arvilla MeresAshley Meyer, PA-S  Pamella Pertostin Marypat Kimmet, MD [facility] Pager [phone]. If 7 PM - 7 AM, please contact night-coverage at [email or web address], password [email or web address] 03/17/2015, 12:23 PM  LOS: 5 days

## 2015-03-17 NOTE — Progress Notes (Addendum)
ANTICOAGULATION CONSULT NOTE  Pharmacy Consult for Coumadin Indication: atrial fibrillation  Allergies  Allergen Reactions  . Bee Venom Swelling  . Latex Rash  . Penicillins Rash   Patient Measurements: Height:  (157.5 cm) Weight: 233 lb 12.8 oz (106.051 kg) IBW/kg (Calculated) : 50.1  Vital Signs: Temp: 98.6 F (37 C) (07/14 0600) Temp Source: Oral (07/14 0600) BP: 118/70 mmHg (07/14 0600) Pulse Rate: 77 (07/14 0600)  Labs:  Recent Labs  03/15/15 0652 03/16/15 0550 03/17/15 0643  HGB 9.3* 9.2* 9.7*  HCT 30.0* 29.8* 31.1*  PLT 232 244 232  LABPROT 35.3* 32.3* 29.8*  INR 3.63* 3.22* 2.89*  CREATININE 1.40* 1.25* 1.27*   Estimated Creatinine Clearance: 45.8 mL/min (by C-G formula based on Cr of 1.27).  Medical History: Past Medical History  Diagnosis Date  . Arthritis   . Chronic atrial fibrillation     a. previously on Pradaxa - on Coumadin since valve surgery.  . Aortic stenosis     a. Severe - s/p pericardial AVR with MAZE 02/04/15.  Marland Kitchen Pneumonia 09/2014  . History of bronchitis 2015  . Joint pain   . Back pain     reason unknown  . Family history of adverse reaction to anesthesia     uncle with MH in the 60's,cousin in the 44's with MH  . 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 COPD (chronic obstructive pulmonary disease)   . Normal coronary arteries     a. By cath 11/2014.  . Pulmonary hypertension     a. Mod by cath 11/2014.  Marland Kitchen Chronic diastolic CHF (congestive heart failure)   . Vocal cord paralysis     a. Dx post-AVR in 01/2015. Required PANDA, intubation during that admission.  . Pleural effusion   . AKI (acute  kidney injury)     a. H/o AKI after AVR in 01/2015.  . Tracheomalacia   . Anemia     a. 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   Medications:  Prescriptions prior to admission  Medication Sig Dispense Refill Last Dose  . albuterol (PROAIR HFA) 108 (90 BASE) MCG/ACT inhaler Inhale 2 puffs into the lungs 4 (four) times daily as needed for wheezing or shortness of breath.   03/12/2015 at Unknown time  . albuterol (PROVENTIL) (2.5 MG/3ML) 0.083% nebulizer solution Take 3 mLs (2.5 mg total) by nebulization every 4 (four) hours as needed for wheezing or shortness of breath. 75 mL 12 03/12/2015 at 1900  . amiodarone (PACERONE) 200 MG tablet Take 1 tablet (200 mg total) by mouth daily.   03/12/2015 at Unknown time  . cephALEXin (KEFLEX) 500 MG capsule Take 500 mg by mouth 4 (four) times daily. 10 day course starting on 03/01/2015   03/11/2015 at Unknown time  . potassium chloride SA (K-DUR,KLOR-CON) 20 MEQ tablet Take 1 tablet (20 mEq total) by mouth 2 (two) times daily.   03/12/2015 at Unknown time  . torsemide (DEMADEX) 20 MG tablet Take 2 tablets (40 mg total) by mouth daily. (Patient taking differently: Take 20 mg by mouth 2 (two) times daily. ) 90 tablet 3 03/12/2015 at Unknown time  . traMADol (ULTRAM) 50 MG tablet Take 1  tablet (50 mg total) by mouth every 6 (six) hours as needed (pain). 50 tablet 0 03/12/2015 at Unknown time  . warfarin (COUMADIN) 4 MG tablet Take 1 tablet (4 mg total) by mouth daily at 6 PM. As directed, bases on INR (Patient taking differently: Take 4 mg by mouth daily at 6 PM. Takes one tablet (4mg  total) Mondays through Fridays. *Takes 5mg  on Saturdays and Sundays*)   03/11/2015 at 1100a  . warfarin (COUMADIN) 5 MG tablet Take 5 mg by mouth 2 (two) times a week. Takes 5mg  on Saturdays and Sundays (*Takes 4mg  on all other days)   03/12/2015 at 1100  . amLODipine (NORVASC) 10 MG tablet Take 1 tablet (10 mg total) by mouth daily. (Patient not taking: Reported on 03/12/2015) 30 tablet  Taking  . budesonide  (PULMICORT) 0.25 MG/2ML nebulizer solution Take 2 mLs (0.25 mg total) by nebulization every 6 (six) hours. (Patient not taking: Reported on 03/12/2015) 60 mL 12  at 1900  . chlorhexidine (PERIDEX) 0.12 % solution 15 mLs by Mouth Rinse route 2 (two) times daily. (Patient not taking: Reported on 03/12/2015) 120 mL 0 Taking  . food thickener (THICK IT) POWD As needed (Patient not taking: Reported on 03/12/2015)  0 Taking  . predniSONE (DELTASONE) 10 MG tablet Take 1 tablet (10 mg total) by mouth daily with breakfast. (Patient not taking: Reported on 03/12/2015)   Taking   Assessment: 74 yo F on chronic Coumadin for Afib that was started after cardiac surgery in May 2016.  INR was supra-therapeutic on admission & held x 4 days.  Coumadin resumed 7/13.  INR therapeutic today. She has not been established in Coumadin clinic as outpatient yet, however appears INR last checked 03/03/15 as outpatient and it was therapeutic at that time.  Supra-therapeutic INR on admission could be related to acute illness of HF exerbation & PNA.  No bleeding noted.   Goal of Therapy:  INR 2-3    Plan:  Coumadin 4mg  po today x 1  INR/PT daily Recommend close outpatient monitoring of INR at discharge  Elson ClanLilliston, Mckenzey Parcell Michelle 03/17/2015,11:00 AM

## 2015-03-18 ENCOUNTER — Inpatient Hospital Stay (HOSPITAL_COMMUNITY): Payer: Medicare Other

## 2015-03-18 DIAGNOSIS — Z9889 Other specified postprocedural states: Secondary | ICD-10-CM

## 2015-03-18 DIAGNOSIS — R600 Localized edema: Secondary | ICD-10-CM

## 2015-03-18 LAB — CULTURE, BLOOD (ROUTINE X 2)
CULTURE: NO GROWTH
Culture: NO GROWTH

## 2015-03-18 LAB — PROTIME-INR
INR: 2.83 — ABNORMAL HIGH (ref 0.00–1.49)
Prothrombin Time: 29.3 seconds — ABNORMAL HIGH (ref 11.6–15.2)

## 2015-03-18 LAB — BASIC METABOLIC PANEL
ANION GAP: 8 (ref 5–15)
BUN: 19 mg/dL (ref 6–20)
CHLORIDE: 97 mmol/L — AB (ref 101–111)
CO2: 35 mmol/L — ABNORMAL HIGH (ref 22–32)
Calcium: 8.5 mg/dL — ABNORMAL LOW (ref 8.9–10.3)
Creatinine, Ser: 1.23 mg/dL — ABNORMAL HIGH (ref 0.44–1.00)
GFR calc Af Amer: 50 mL/min — ABNORMAL LOW (ref >60)
GFR calc non Af Amer: 43 mL/min — ABNORMAL LOW (ref >60)
Glucose, Bld: 86 mg/dL (ref 65–99)
Potassium: 3.6 mmol/L (ref 3.5–5.1)
SODIUM: 140 mmol/L (ref 135–145)

## 2015-03-18 LAB — HEPATIC FUNCTION PANEL
ALBUMIN: 2.8 g/dL — AB (ref 3.5–5.0)
ALT: 18 U/L (ref 14–54)
AST: 20 U/L (ref 15–41)
Alkaline Phosphatase: 95 U/L (ref 38–126)
Bilirubin, Direct: 0.1 mg/dL (ref 0.1–0.5)
Indirect Bilirubin: 0.7 mg/dL (ref 0.3–0.9)
TOTAL PROTEIN: 6.3 g/dL — AB (ref 6.5–8.1)
Total Bilirubin: 0.8 mg/dL (ref 0.3–1.2)

## 2015-03-18 MED ORDER — POTASSIUM CHLORIDE CRYS ER 20 MEQ PO TBCR
40.0000 meq | EXTENDED_RELEASE_TABLET | Freq: Two times a day (BID) | ORAL | Status: DC
  Administered 2015-03-18 – 2015-03-20 (×4): 40 meq via ORAL
  Filled 2015-03-18 (×3): qty 2

## 2015-03-18 MED ORDER — WARFARIN SODIUM 2 MG PO TABS
4.0000 mg | ORAL_TABLET | Freq: Once | ORAL | Status: AC
  Administered 2015-03-18: 4 mg via ORAL
  Filled 2015-03-18: qty 2

## 2015-03-18 MED ORDER — METOLAZONE 5 MG PO TABS
2.5000 mg | ORAL_TABLET | Freq: Once | ORAL | Status: AC
  Administered 2015-03-18: 2.5 mg via ORAL
  Filled 2015-03-18: qty 1

## 2015-03-18 NOTE — Progress Notes (Signed)
PROGRESS NOTE  Sandra Turner NUU:[id number] DOB: 23-Oct-1940 DOA: 03/12/2015 PCP: [name], MD   HPI: Patient is a pleasant and cooperative 74yr old female admitted 03/12/15 for progressive worsening shortness of breath and dyspnea x 3 weeks. She has a past medical history significant for aortic valve replacement and aortic root enlargment s/p May 2016, COPD, atrial fibrillation, and arthritis.   Subjective / 24 H Interval events Sandra Turner denies any overnight events or new complaints; however, she is anxious to get home. She reports being up constantly to void with the recent changes in medication; however, feels the medications have helped with the leg swelling.  She slept upright in the chair last night. She feels she is "starving" because all the food is "full of carbohydrates and it makes me swell." Endorses improvement in breathing. Denies chest pain, palpitations, abdominal pain, nausea, vomiting, dirrrhea, constipation.   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 O2 sats remain 95%  - Stable dyspnea - Continue nasal cannula 2L/min, nebulizer treatment PRN for symptoms. Monitor for symptoms.   Anemia  - Hgb on admission was 9, likely in the setting  of chronic disease - Stable  Acute on chronic renal failure, underlying CKD III - On admission serum creatinine was 1.8 - Serum creatinine has remained stable, today 1.23.  - Continue IV lasix; continue to monitor kidney function  Chronic Atrial fibrillation - Stable - Continue diltiazem  BID - On Coumadin per pharmacy, continue monitoring  S/p Aortic valve replacement and aortic root dilation - Continue coumadin therapy per pharmacy   Diet:  heart  Fluids: none DVT Prophylaxis: Coumadin  Code Status: Full Code Family Communication: self  Disposition Plan: continue inpatient admission  Consultants:  Cardiology  PT  Procedures:  2d echo  Antibiotics  Levaquin>>7/9>> 7/9 (stopped)>>7/14 (restart)>>  Vancomycin>> 7/9>>7/14  Zosyn>>7/9>>7/14    Studies  Dg Chest 2 View  03/16/2015   CLINICAL DATA:  Shortness of breath and cough for 3 weeks. Pleural effusions. Atrial fibrillation. COPD.  EXAM: CHEST  2 VIEW  COMPARISON:  03/12/2015  FINDINGS: Patient has undergone previous aortic valve replacement. Mild cardiomegaly stable. Diffuse pulmonary vascular congestion and interstitial prominence is suspicious for mild interstitial edema. Infiltrate or atelectasis is seen in the lingula, without significant change. Small bilateral pleural effusions appears stable.  IMPRESSION: Mild congestive heart failure and small bilateral pleural effusions, without significant change.  Stable lingular infiltrate versus atelectasis.   Electronically Signed   By: Myles Rosenthal M.D.   On: 03/16/2015 19:36    Objective  Filed Vitals:   03/17/15 2040 03/17/15 2045 03/18/15 0401 03/18/15 0906  BP: 116/42  115/33 128/50  Pulse:   69 70  Temp: 98 F (36.7 C)  97.8 F (  36.6 C)   TempSrc: Oral  Oral   Resp: 22  21   Height:      Weight:   104.962 kg (231 lb 6.4 oz)   SpO2: 89% 95% 95%     Intake/Output Summary (Last 24 hours) at 03/18/15 0954 Last data filed at 03/18/15 0851  Gross  per 24 hour  Intake    480 ml  Output   1250 ml  Net   -770 ml   Filed Weights   03/16/15 0519 03/17/15 0600 03/18/15 0401  Weight: 105.779 kg (233 lb 3.2 oz) 106.051 kg (233 lb 12.8 oz) 104.962 kg (231 lb 6.4 oz)    Exam:  General: Patient sitting upright in chair watching tv, alert and orientated. Some abdominal accessory muscle use, but in no acute distress.   HEENT: no scleral icterus, PERRL  Cardiovascular: irregularly irregular, systolic ejection murmur, LE pulses not appreciated due to edema, UE radial pulses 2+ b/l, 3+ pitting edema (improved)  Respiratory: Diffuse crackles.   Abdomen: soft, non tender, BS +, no guarding  MSK/Extremities: no clubbing/cyanosis, no joint swelling  Skin: no rashes, skin changes present on LE b/l  Neuro: moves all 4  Data Reviewed: Basic Metabolic Panel:  Recent Labs Lab 03/14/15 0640 03/15/15 0652 03/16/15 0550 03/17/15 0643 03/18/15 0703  NA 139 140 142 143 140  K 3.8 3.6 3.5 3.9 3.6  CL 96* 96* 98* 98* 97*  CO2 33* 33* 36* 36* 35*  GLUCOSE 87 91 93 107* 86  BUN 35* 28* 23* 20 19  CREATININE 1.59* 1.40* 1.25* 1.27* 1.23*  CALCIUM 8.6* 8.4* 8.6* 8.6* 8.5*   Liver Function Tests:  Recent Labs Lab 03/12/15 2156 03/13/15 0615 03/18/15 0703  AST 27 23 20   ALT 27 25 18   ALKPHOS 142* 129* 95  BILITOT 0.7 1.2 0.8  PROT 6.8 6.2* 6.3*  ALBUMIN 3.2* 2.9* 2.8*   CBC:  Recent Labs Lab 03/12/15 2156 03/13/15 0615 03/14/15 0640 03/15/15 0652 03/16/15 0550 03/17/15 0643  WBC 7.0 6.5 5.8 5.9 6.4 6.1  NEUTROABS 5.2  --   --   --   --   --   HGB 9.1* 9.0* 9.7* 9.3* 9.2* 9.7*  HCT 28.8* 28.5* 31.0* 30.0* 29.8* 31.1*  MCV 93.5 93.8 93.9 94.3 95.2 95.4  PLT 222 217 240 232 244 232   Cardiac Enzymes:  Recent Labs Lab 03/12/15 2156 03/13/15 0131 03/13/15 0615 03/13/15 1209  TROPONINI 0.03 <0.03 <0.03 <0.03   BNP (last 3 results)  Recent Labs  02/07/15 0520 02/12/15 0435 03/12/15 2156  BNP 204.8* 271.9*  178.0*   Recent Results (from the past 240 hour(s))  Culture, blood (routine x 2)     Status: None   Collection Time: 03/13/15  1:31 AM  Result Value Ref Range Status   Specimen Description BLOOD RIGHT HAND  Final   Special Requests BOTTLES DRAWN AEROBIC ONLY 7CC  Final   Culture NO GROWTH 5 DAYS  Final   Report Status 03/18/2015 FINAL  Final  Culture, blood (routine x 2)     Status: None   Collection Time: 03/13/15  1:49 AM  Result Value Ref Range Status   Specimen Description BLOOD LEFT ANTECUBITAL  Final   Special Requests BOTTLES DRAWN AEROBIC ONLY 7CC  Final   Culture NO GROWTH 5 DAYS  Final   Report Status 03/18/2015 FINAL  Final    Scheduled Meds: . antiseptic oral rinse  7 mL Mouth Rinse BID  . diltiazem  30 mg Oral BID  . furosemide  80 mg Intravenous BID  . levofloxacin  500 mg Oral Daily  . potassium chloride SA  20 mEq Oral BID  . sodium chloride  3 mL Intravenous Q12H  . sodium chloride  3 mL Intravenous Q12H  . Warfarin - Pharmacist Dosing Inpatient   Does not apply Q24H   Continuous Infusions:   Arvilla MeresAshley Meyer, PA-Student  Pamella Pertostin Gherghe, MD [facility] Pager [phone]. If 7 PM - 7 AM, please contact night-coverage at [email or web address], password [email or web address] 03/18/2015, 9:54 AM  LOS: 6 days

## 2015-03-18 NOTE — Progress Notes (Signed)
Consulting cardiologist: Prentice DockerKoneswaran, Jenyfer Trawick MD Primary Cardiologist: Nona DellMcDowell, Samuel MD  Cardiology Specific Problem List: 1. Acute on Chronic Diastolic CHF 2. Atrial fib 3. Bioprosthetic Aortic Valve  Subjective:    Continues chronically dyspneic with LEE. Wants to go home.   Objective:   Temp:  [97.8 F (36.6 C)-98 F (36.7 C)] 97.8 F (36.6 C) (07/15 0401) Pulse Rate:  [68-70] 70 (07/15 0906) Resp:  [20-22] 21 (07/15 0401) BP: (115-129)/(33-58) 128/50 mmHg (07/15 0906) SpO2:  [89 %-97 %] 95 % (07/15 0401) Weight:  [231 lb 6.4 oz (104.962 kg)] 231 lb 6.4 oz (104.962 kg) (07/15 0401) Last BM Date: 03/17/15  Filed Weights   03/16/15 0519 03/17/15 0600 03/18/15 0401  Weight: 233 lb 3.2 oz (105.779 kg) 233 lb 12.8 oz (106.051 kg) 231 lb 6.4 oz (104.962 kg)    Intake/Output Summary (Last 24 hours) at 03/18/15 1126 Last data filed at 03/18/15 1100  Gross per 24 hour  Intake    480 ml  Output   1850 ml  Net  -1370 ml    Telemetry: atrial fib  Exam:  General: No acute distress.Dyspneic on O2 with speaking.   HEENT: Conjunctiva and lids normal, oropharynx clear.  Lungs: Clear to auscultation, Diminished in the right base.   Cardiac: No elevated JVP or bruits. RRR, no gallop or rub.   Abdomen: Normoactive bowel sounds, nontender, nondistended.  Extremities:2+ -3+ pitting edema to the knees bilaterally, distal pulses full.  Neuropsychiatric: Alert and oriented x3, affect appropriate.   Lab Results:  Basic Metabolic Panel:  Recent Labs Lab 03/16/15 0550 03/17/15 0643 03/18/15 0703  NA 142 143 140  K 3.5 3.9 3.6  CL 98* 98* 97*  CO2 36* 36* 35*  GLUCOSE 93 107* 86  BUN 23* 20 19  CREATININE 1.25* 1.27* 1.23*  CALCIUM 8.6* 8.6* 8.5*    Liver Function Tests:  Recent Labs Lab 03/12/15 2156 03/13/15 0615 03/18/15 0703  AST 27 23 20   ALT 27 25 18   ALKPHOS 142* 129* 95  BILITOT 0.7 1.2 0.8  PROT 6.8 6.2* 6.3*  ALBUMIN 3.2* 2.9* 2.8*     CBC:  Recent Labs Lab 03/15/15 0652 03/16/15 0550 03/17/15 0643  WBC 5.9 6.4 6.1  HGB 9.3* 9.2* 9.7*  HCT 30.0* 29.8* 31.1*  MCV 94.3 95.2 95.4  PLT 232 244 232    Cardiac Enzymes:  Recent Labs Lab 03/13/15 0131 03/13/15 0615 03/13/15 1209  TROPONINI <0.03 <0.03 <0.03   Coagulation:  Recent Labs Lab 03/16/15 0550 03/17/15 0643 03/18/15 0703  INR 3.22* 2.89* 2.83*    Radiology: Dg Chest 2 View  03/16/2015   CLINICAL DATA:  Shortness of breath and cough for 3 weeks. Pleural effusions. Atrial fibrillation. COPD.  EXAM: CHEST  2 VIEW  COMPARISON:  03/12/2015  FINDINGS: Patient has undergone previous aortic valve replacement. Mild cardiomegaly stable. Diffuse pulmonary vascular congestion and interstitial prominence is suspicious for mild interstitial edema. Infiltrate or atelectasis is seen in the lingula, without significant change. Small bilateral pleural effusions appears stable.  IMPRESSION: Mild congestive heart failure and small bilateral pleural effusions, without significant change.  Stable lingular infiltrate versus atelectasis.   Electronically Signed   By: Myles RosenthalJohn  Stahl M.D.   On: 03/16/2015 19:36      Medications:   Scheduled Medications: . antiseptic oral rinse  7 mL Mouth Rinse BID  . diltiazem  30 mg Oral BID  . furosemide  80 mg Intravenous BID  . levofloxacin  500 mg  Oral Daily  . potassium chloride SA  20 mEq Oral BID  . sodium chloride  3 mL Intravenous Q12H  . sodium chloride  3 mL Intravenous Q12H  . Warfarin - Pharmacist Dosing Inpatient   Does not apply Q24H    Infusions:    PRN Medications: sodium chloride, acetaminophen **OR** acetaminophen, albuterol, sodium chloride, traMADol   Assessment and Plan:   1.Acute on Chronic Diastolic CHF: Still with significant LEE 2+-3+ to the knees. I have talked to her about staying another 24 hours for diureses. She is reluctant to do so but will stay one more day. She has diuresed 3.378 since  admission. I think she needs at least 5 more lbs off or more. She was given on dose of metolazone 2.5 yesterday without significant decrease in her renal function. I will give one more dose with her IV diuretic. Fluid restriction would also be recommended.   States she wants her diet changed to no carbohydrates as she feels this causes her to retain fluid. I have changed her diet orders and requested dietician consult.    2. Bilateral pleural effusions: These have been recurrent for her. She continues dyspneic with decreased lung sounds in the right base. Consider decubitus films to evaluate for need for repeat thoracentesis as she continues so dyspneic despite know hx of COPD.Marland Kitchen   3. Atrial fibrillation:  Rate is controlled. She remains on coumadin. Taken off of amiodarone yesterday. Continues on diltiazem started yesterday. Heart rate and BP are controlled.    Bettey Mare. Lawrence NP [facility]  03/18/2015, 11:26 AM   The patient was seen and examined, and I agree with the assessment and plan as documented above, with modifications as noted below. While patient eager to go home, she has significant lower extremity edema. Would not discharge until this is resolved, with at least another 5-10 lbs to go. Agree with metolazone today and would give again tomorrow and Sunday (perhaps 5 mg each day if necessary and renal function allows).

## 2015-03-18 NOTE — Progress Notes (Signed)
PT Cancellation Note  Patient Details Name: Sandra Turner MRN: [id number] DOB: 07/20/1941   Cancelled Treatment:    Reason Eval/Treat Not Completed: PT screened, no needs identified, will sign off.  Pt reports that she has been up in the room quite a bit and feels that she will have no difficulty managing at home.  She is very anxious to get home.     [name], Kristian Hazzard L 03/18/2015, 8:42 AM

## 2015-03-18 NOTE — Progress Notes (Signed)
ANTICOAGULATION CONSULT NOTE  Pharmacy Consult for Coumadin Indication: atrial fibrillation  Allergies  Allergen Reactions  . Bee Venom Swelling  . Latex Rash  . Penicillins Rash   Patient Measurements: Height: 5\' 2"  (157.5 cm) Weight: 231 lb 6.4 oz (104.962 kg) IBW/kg (Calculated) : 50.1  Vital Signs: Temp: 97.8 F (36.6 C) (07/15 0401) Temp Source: Oral (07/15 0401) BP: 128/50 mmHg (07/15 0906) Pulse Rate: 70 (07/15 0906)  Labs:  Recent Labs  03/16/15 0550 03/17/15 0643 03/18/15 0703  HGB 9.2* 9.7*  --   HCT 29.8* 31.1*  --   PLT 244 232  --   LABPROT 32.3* 29.8* 29.3*  INR 3.22* 2.89* 2.83*  CREATININE 1.25* 1.27* 1.23*   Estimated Creatinine Clearance: 47.1 mL/min (by C-G formula based on Cr of 1.23).  Medical History: Past Medical History  Diagnosis Date  . Arthritis   . Chronic atrial fibrillation     a. previously on Pradaxa - on Coumadin since valve surgery.  . Aortic stenosis     a. Severe - s/p pericardial AVR with MAZE 02/04/15.  Marland Kitchen. Pneumonia 09/2014  . History of bronchitis 2015  . Joint pain   . Back pain     reason unknown  . Family history of adverse reaction to anesthesia     uncle with MH in the 60's,cousin in the 6170's with MH  . 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 COPD (chronic obstructive pulmonary disease)   . Normal coronary arteries     a. By cath 11/2014.  . Pulmonary hypertension     a. Mod by cath 11/2014.  Marland Kitchen. Chronic diastolic CHF (congestive heart failure)   . Vocal cord paralysis     a. Dx post-AVR in 01/2015. Required PANDA, intubation during that admission.  . Pleural effusion   . AKI  (acute kidney injury)     a. H/o AKI after AVR in 01/2015.  . Tracheomalacia   . Anemia     a. 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   Medications:  Prescriptions prior to admission  Medication Sig Dispense Refill Last Dose  . albuterol (PROAIR HFA) 108 (90 BASE) MCG/ACT inhaler Inhale 2 puffs into the lungs 4 (four) times daily as needed for wheezing or shortness of breath.   03/12/2015 at Unknown time  . albuterol (PROVENTIL) (2.5 MG/3ML) 0.083% nebulizer solution Take 3 mLs (2.5 mg total) by nebulization every 4 (four) hours as needed for wheezing or shortness of breath. 75 mL 12 03/12/2015 at 1900  . amiodarone (PACERONE) 200 MG tablet Take 1 tablet (200 mg total) by mouth daily.   03/12/2015 at Unknown time  . cephALEXin (KEFLEX) 500 MG capsule Take 500 mg by mouth 4 (four) times daily. 10 day course starting on 03/01/2015   03/11/2015 at Unknown time  . potassium chloride SA (K-DUR,KLOR-CON) 20 MEQ tablet Take 1 tablet (20 mEq total) by mouth 2 (two) times daily.   03/12/2015 at Unknown time  . torsemide (DEMADEX) 20 MG tablet Take 2 tablets (40 mg total) by mouth daily. (Patient taking differently: Take 20 mg by mouth 2 (two) times daily. ) 90 tablet 3 03/12/2015 at Unknown time  . traMADol (  ULTRAM) 50 MG tablet Take 1 tablet (50 mg total) by mouth every 6 (six) hours as needed (pain). 50 tablet 0 03/12/2015 at Unknown time  . warfarin (COUMADIN) 4 MG tablet Take 1 tablet (4 mg total) by mouth daily at 6 PM. As directed, bases on INR (Patient taking differently: Take 4 mg by mouth daily at 6 PM. Takes one tablet (  total) Mondays through Fridays. *Takes  on Saturdays and Sundays*)   03/11/2015 at 1100a  . warfarin (COUMADIN) 5 MG tablet Take 5 mg by mouth 2 (two) times a week. Takes  on Saturdays and Sundays (*Takes  on all other days)   03/12/2015 at 1100  . amLODipine (NORVASC) 10 MG tablet Take 1 tablet (10 mg total) by mouth daily. (Patient not taking: Reported on 03/12/2015) 30 tablet  Taking  .  budesonide (PULMICORT) 0.25 MG/2ML nebulizer solution Take 2 mLs (0.25 mg total) by nebulization every 6 (six) hours. (Patient not taking: Reported on 03/12/2015) 60 mL 12  at 1900  . chlorhexidine (PERIDEX) 0.12 % solution 15 mLs by Mouth Rinse route 2 (two) times daily. (Patient not taking: Reported on 03/12/2015) 120 mL 0 Taking  . food thickener (THICK IT) POWD As needed (Patient not taking: Reported on 03/12/2015)  0 Taking  . predniSONE (DELTASONE) 10 MG tablet Take 1 tablet (10 mg total) by mouth daily with breakfast. (Patient not taking: Reported on 03/12/2015)   Taking   Assessment: 74 yo F on chronic Coumadin for Afib that was started after cardiac surgery in May 2016.  INR was supra-therapeutic on admission & held x 4 days.  Coumadin resumed 7/13.  INR therapeutic today. She has not been established in Coumadin clinic as outpatient yet, however appears INR last checked 03/03/15 as outpatient and it was therapeutic at that time.  Supra-therapeutic INR on admission could be related to acute illness of HF exerbation & PNA.  No bleeding noted.   Goal of Therapy:  INR 2-3    Plan:  Coumadin  po today x 1  INR/PT daily Recommend close outpatient monitoring of INR at discharge  Valrie Hart A 03/18/2015,12:06 PM

## 2015-03-18 NOTE — Care Management Note (Signed)
Case Management Note  Patient Details  Name: Sandra Turner MRN: [id number] Date of Birth: [birth year]  Subjective/Objective:                    Action/Plan:   Expected Discharge Date:                  Expected Discharge Plan:  Home w Home Health Services  In-House Referral:  NA  Discharge planning Services  CM Consult  Post Acute Care Choice:  Resumption of Svcs/PTA Provider Choice offered to:  Patient  DME Arranged:    DME Agency:     HH Arranged:  RN, PT HH [facility]  Status of Service:  In process, will continue to follow  Medicare Important Message Given:  Yes-second notification given Date Medicare IM Given:    Medicare IM give by:    Date Additional Medicare IM Given:    Additional Medicare Important Message give by:     If discussed at Long Length of Stay Meetings, dates discussed:    Additional Comments: Aniticipate discharge over the weekend with [facility] (per pts choice). Weekend staff to call and fax orders at discharge. If pt needs home O2 at discharge, [facility].  Arlyss QueenBlackwell, Arben Packman Elk Groverowder, RN 03/18/2015, 12:40 PM

## 2015-03-19 LAB — BASIC METABOLIC PANEL
ANION GAP: 10 (ref 5–15)
BUN: 19 mg/dL (ref 6–20)
CHLORIDE: 94 mmol/L — AB (ref 101–111)
CO2: 35 mmol/L — ABNORMAL HIGH (ref 22–32)
Calcium: 8.6 mg/dL — ABNORMAL LOW (ref 8.9–10.3)
Creatinine, Ser: 1.21 mg/dL — ABNORMAL HIGH (ref 0.44–1.00)
GFR calc Af Amer: 51 mL/min — ABNORMAL LOW (ref >60)
GFR, EST NON AFRICAN AMERICAN: 44 mL/min — AB (ref >60)
Glucose, Bld: 87 mg/dL (ref 65–99)
Potassium: 3.5 mmol/L (ref 3.5–5.1)
SODIUM: 139 mmol/L (ref 135–145)

## 2015-03-19 LAB — PROTIME-INR
INR: 2.57 — ABNORMAL HIGH (ref 0.00–1.49)
PROTHROMBIN TIME: 27.3 s — AB (ref 11.6–15.2)

## 2015-03-19 MED ORDER — WARFARIN SODIUM 2 MG PO TABS
4.0000 mg | ORAL_TABLET | Freq: Once | ORAL | Status: AC
  Administered 2015-03-19: 4 mg via ORAL
  Filled 2015-03-19: qty 2

## 2015-03-19 MED ORDER — METOLAZONE 5 MG PO TABS
5.0000 mg | ORAL_TABLET | Freq: Once | ORAL | Status: AC
  Administered 2015-03-19: 5 mg via ORAL
  Filled 2015-03-19: qty 1

## 2015-03-19 NOTE — Progress Notes (Signed)
ANTICOAGULATION CONSULT NOTE  Pharmacy Consult for Coumadin Indication: atrial fibrillation  Allergies  Allergen Reactions  . Bee Venom Swelling  . Latex Rash  . Penicillins Rash   Patient Measurements: Height:  (157.5 cm) Weight: 226 lb (102.513 kg) IBW/kg (Calculated) : 50.1  Vital Signs: Temp: 98.3 F (36.8 C) (07/16 0502) Temp Source: Oral (07/16 0502) BP: 122/59 mmHg (07/16 0502) Pulse Rate: 69 (07/16 0502)  Labs:  Recent Labs  03/17/15 0643 03/18/15 0703 03/19/15 0645  HGB 9.7*  --   --   HCT 31.1*  --   --   PLT 232  --   --   LABPROT 29.8* 29.3* 27.3*  INR 2.89* 2.83* 2.57*  CREATININE 1.27* 1.23* 1.21*   Estimated Creatinine Clearance: 47.2 mL/min (by C-G formula based on Cr of 1.21).  Medical History: Past Medical History  Diagnosis Date  . Arthritis   . Chronic atrial fibrillation     a. previously on Pradaxa - on Coumadin since valve surgery.  . Aortic stenosis     a. Severe - s/p pericardial AVR with MAZE 02/04/15.  Marland Kitchen Pneumonia 09/2014  . History of bronchitis 2015  . Joint pain   . Back pain     reason unknown  . Family history of adverse reaction to anesthesia     uncle with MH in the 60's,cousin in the 44's with MH  . 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 COPD (chronic obstructive pulmonary disease)   . Normal coronary arteries     a. By cath 11/2014.  . Pulmonary hypertension     a. Mod by cath 11/2014.  Marland Kitchen Chronic diastolic CHF (congestive heart failure)   . Vocal cord paralysis     a. Dx post-AVR in 01/2015. Required PANDA, intubation during that admission.  . Pleural effusion   . AKI (acute  kidney injury)     a. H/o AKI after AVR in 01/2015.  . Tracheomalacia   . Anemia     a. 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   Medications:  Prescriptions prior to admission  Medication Sig Dispense Refill Last Dose  . albuterol (PROAIR HFA) 108 (90 BASE) MCG/ACT inhaler Inhale 2 puffs into the lungs 4 (four) times daily as needed for wheezing or shortness of breath.   03/12/2015 at Unknown time  . albuterol (PROVENTIL) (2.5 MG/3ML) 0.083% nebulizer solution Take 3 mLs (2.5 mg total) by nebulization every 4 (four) hours as needed for wheezing or shortness of breath. 75 mL 12 03/12/2015 at 1900  . amiodarone (PACERONE) 200 MG tablet Take 1 tablet (200 mg total) by mouth daily.   03/12/2015 at Unknown time  . cephALEXin (KEFLEX) 500 MG capsule Take 500 mg by mouth 4 (four) times daily. 10 day course starting on 03/01/2015   03/11/2015 at Unknown time  . potassium chloride SA (K-DUR,KLOR-CON) 20 MEQ tablet Take 1 tablet (20 mEq total) by mouth 2 (two) times daily.   03/12/2015 at Unknown time  . torsemide (DEMADEX) 20 MG tablet Take 2 tablets (40 mg total) by mouth daily. (Patient taking differently: Take 20 mg by mouth 2 (two) times daily. ) 90 tablet 3 03/12/2015 at Unknown  time  . traMADol (ULTRAM) 50 MG tablet Take 1 tablet (50 mg total) by mouth every 6 (six) hours as needed (pain). 50 tablet 0 03/12/2015 at Unknown time  . warfarin (COUMADIN) 4 MG tablet Take 1 tablet (4 mg total) by mouth daily at 6 PM. As directed, bases on INR (Patient taking differently: Take 4 mg by mouth daily at 6 PM. Takes one tablet (4mg  total) Mondays through Fridays. *Takes 5mg  on Saturdays and Sundays*)   03/11/2015 at 1100a  . warfarin (COUMADIN) 5 MG tablet Take 5 mg by mouth 2 (two) times a week. Takes 5mg  on Saturdays and Sundays (*Takes 4mg  on all other days)   03/12/2015 at 1100  . amLODipine (NORVASC) 10 MG tablet Take 1 tablet (10 mg total) by mouth daily. (Patient not taking: Reported on 03/12/2015) 30 tablet  Taking  . budesonide  (PULMICORT) 0.25 MG/2ML nebulizer solution Take 2 mLs (0.25 mg total) by nebulization every 6 (six) hours. (Patient not taking: Reported on 03/12/2015) 60 mL 12  at 1900  . chlorhexidine (PERIDEX) 0.12 % solution 15 mLs by Mouth Rinse route 2 (two) times daily. (Patient not taking: Reported on 03/12/2015) 120 mL 0 Taking  . food thickener (THICK IT) POWD As needed (Patient not taking: Reported on 03/12/2015)  0 Taking  . predniSONE (DELTASONE) 10 MG tablet Take 1 tablet (10 mg total) by mouth daily with breakfast. (Patient not taking: Reported on 03/12/2015)   Taking   Assessment: 74 yo F on chronic Coumadin for Afib that was started after cardiac surgery in May 2016.  INR was supra-therapeutic on admission & held x 4 days.  Coumadin resumed 7/13.  INR therapeutic today. She has not been established in Coumadin clinic as outpatient yet, however appears INR last checked 03/03/15 as outpatient and it was therapeutic at that time.  Supra-therapeutic INR on admission could be related to acute illness of HF exerbation & PNA.  No bleeding noted.   Goal of Therapy:  INR 2-3    Plan:  Coumadin 4mg  po today x 1  INR/PT daily Recommend close outpatient monitoring of INR at discharge  Valrie HartHall, Bailea Beed A 03/19/2015,8:59 AM

## 2015-03-19 NOTE — Progress Notes (Signed)
Nutrition Follow-up  DOCUMENTATION CODES:  Morbid obesity  INTERVENTION:  -Continue high protein snacks ordered to promote wound healing  -If patient still admitted Monday, will provide Education on low Na diet  NUTRITION DIAGNOSIS:  Inadequate oral intake resolved  Morbid Obesity related to chronic over nutrition and lack of physical activity as evidenced by BMI >40  GOAL:  Patient will meet greater than or equal to 90% of their needs  MONITOR:  PO intake, Labs, Weight trends  REASON FOR ASSESSMENT:  Consult Assessment of nutrition requirement/status  ASSESSMENT:  Sandra Turner admitted 03/12/15 for progressive worsening shortness of breath and dyspnea x 3 weeks. PMHx significant for aortic valve replacement and aortic root enlargment s/p May 2016, COPD, A-Fib, and arthritis.   Was consulted to assess pt nutritional status. Pt was seen by RD on 7/11. Per RD interview with pt, the pt has had good appetite, but had trouble d/t dyspnea. Since last assessment PO intake of meals has been good (75-100%).  Pt refused oral supplements and high protein snacks were ordered for patient  Perr MD note on 7/15-Dietitian looks to be consulted regarding potential heart failure. Likely related to diet education. RD operating remote today, if available will meet Monday   Diet Order:  Diet Heart Room service appropriate?: Yes; Fluid consistency:: Thin; Fluid restriction:: 1500 mL Fluid  Skin:  PU stage 1 on ear. Skin Dry/ecchymsis  Last BM:  7/15  Height:  Ht Readings from Last 1 Encounters:  03/12/15 5\' 2"  (1.575 m)    Weight:  Wt Readings from Last 1 Encounters:  03/19/15 226 lb (102.513 kg)    Ideal Body Weight:  50 kg  Wt Readings from Last 10 Encounters:  03/19/15 226 lb (102.513 kg)  03/03/15 233 lb (105.688 kg)  02/21/15 223 lb 11.2 oz (101.47 kg)  01/17/15 247 lb (112.038 kg)  01/17/15 237 lb 11.2 oz (107.82 kg)  12/27/14 231 lb (104.781 kg)  11/22/14 240 lb (108.863  kg)  11/01/14 240 lb (108.863 kg)  10/04/14 241 lb 9.6 oz (109.589 kg)  09/11/14 240 lb 11.2 oz (109.181 kg)    BMI:  Body mass index is 41.33 kg/(m^2).  Estimated Nutritional Needs:  Kcal:  1450-1550 (14-15 kcal/kg) Protein:  60-70 g Pro (1.2-1.4 g/kg IBW) Fluid:  1.5-1.6 liters  EDUCATION NEEDS:  Education needs addressed  Christophe LouisNathan Franks RD, LDN Nutrition Pager: [phone] 03/19/2015 8:16 AM

## 2015-03-19 NOTE — Progress Notes (Signed)
PROGRESS NOTE  Sandra Turner ZOX:[id number] DOB: 1941/02/22 DOA: 03/12/2015 PCP: Ernestine ConradBLUTH, [name], MD   HPI: Patient is a pleasant and cooperative 74yr old female admitted 03/12/15 for progressive worsening shortness of breath and dyspnea x 3 weeks. She has a past medical history significant for aortic valve replacement and aortic root enlargment s/p May 2016, COPD, atrial fibrillation, and arthritis.   Subjective / 24 H Interval events Begging me to go home, crying - no chest pain, shortness of breath, no abdominal pain, nausea or vomiting.    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 O2 sats remain 95%  - Stable dyspnea - Continue nasal cannula 2L/min, nebulizer treatment PRN for symptoms. Monitor for symptoms.   Anemia  - Hgb on admission was 9, likely in the setting of chronic disease - Stable  Acute on chronic renal failure, underlying CKD III - On admission serum creatinine was 1.8 - Serum creatinine has remained stable, today 1.2 - Continue IV lasix; continue to monitor kidney function  Chronic Atrial fibrillation - Stable - Continue diltiazem 30mg  BID - On Coumadin per pharmacy, continue monitoring  S/p Aortic valve replacement and aortic root dilation - Continue coumadin  therapy per pharmacy   Diet: Diet Heart Room service appropriate?: Yes; Fluid consistency:: Thin; Fluid restriction:: 1500 mL Fluidheart  Fluids: none DVT Prophylaxis: Coumadin  Code Status: Full Code Family Communication: self  Disposition Plan: continue inpatient admission  Consultants:  Cardiology  PT  Procedures:  2d echo  Antibiotics  Levaquin>>7/9>> 7/9 (stopped)>>7/14 (restart)>>  Vancomycin>> 7/9>>7/14  Zosyn>>7/9>>7/14    Studies  No results found.  Objective  Filed Vitals:   03/18/15 2041 03/19/15 0502 03/19/15 1421 03/19/15 1706  BP: 126/54 122/59 128/42   Pulse: 74 69 73   Temp: 98.9 F (37.2 C) 98.3 F (36.8 C)    TempSrc: Oral Oral    Resp: 20 20 20    Height:      Weight:  102.513 kg (226 lb)  101.787 kg (224 lb 6.4 oz)  SpO2: 97% 93% 96%     Intake/Output Summary (Last 24 hours) at 03/19/15 1740 Last data filed at 03/19/15 1652  Gross per 24 hour  Intake    480 ml  Output   3400 ml  Net  -2920 ml   Filed Weights   03/18/15 0401 03/19/15 0502 03/19/15 1706  Weight: 104.962 kg (231 lb 6.4 oz) 102.513 kg (226 lb) 101.787 kg (224 lb 6.4 oz)    Exam:  General: Patient sitting upright in chair watching tv, alert and orientated. Some abdominal accessory muscle use, but in no acute distress.   HEENT: no scleral icterus, PERRL  Cardiovascular: irregularly irregular, systolic ejection murmur, LE pulses not appreciated due to edema, UE radial pulses 2+ b/l,  2+ pitting edema (improved)  Respiratory: Diffuse crackles.   Abdomen: soft, non tender, BS +, no guarding  MSK/Extremities: no clubbing/cyanosis, no joint swelling  Skin: no rashes, skin changes present on LE b/l  Neuro: moves all 4  Data Reviewed: Basic Metabolic Panel:  Recent Labs Lab 03/15/15 0652 03/16/15 0550 03/17/15 0643 03/18/15 0703 03/19/15 0645  NA 140 142 143 140 139  K 3.6 3.5 3.9 3.6 3.5  CL 96* 98* 98* 97* 94*  CO2 33* 36* 36* 35* 35*  GLUCOSE 91 93  107* 86 87  BUN 28* 23* CREATININE 1.40* 1.25* 1.27* 1.23* 1.21*  CALCIUM 8.4* 8.6* 8.6* 8.5* 8.6*   Liver Function Tests:  Recent Labs Lab 03/12/15 2156 03/13/15 0615 03/18/15 0703  AST ALT ALKPHOS 142* 129* 95  BILITOT 0.7 1.2 0.8  PROT 6.8 6.2* 6.3*  ALBUMIN 3.2* 2.9* 2.8*   CBC:  Recent Labs Lab 03/12/15 2156 03/13/15 0615 03/14/15 0640 03/15/15 0652 03/16/15 0550 03/17/15 0643  WBC 7.0 6.5 5.8 5.9 6.4 6.1  NEUTROABS 5.2  --   --   --   --   --   HGB 9.1* 9.0* 9.7* 9.3* 9.2* 9.7*  HCT 28.8* 28.5* 31.0* 30.0* 29.8* 31.1*  MCV 93.5 93.8 93.9 94.3 95.2 95.4  PLT 222 217 240 232 244 232   Cardiac Enzymes:  Recent Labs Lab 03/12/15 2156 03/13/15 0131 03/13/15 0615 03/13/15 1209  TROPONINI 0.03 <0.03 <0.03 <0.03   BNP (last 3 results)  Recent Labs  02/07/15 0520 02/12/15 0435 03/12/15 2156  BNP 204.8* 271.9* 178.0*   Recent Results (from the past 240 hour(s))  Culture, blood (routine x 2)     Status: None   Collection Time: 03/13/15  1:31 AM  Result Value Ref Range Status   Specimen Description BLOOD RIGHT HAND  Final   Special Requests BOTTLES DRAWN AEROBIC ONLY 7CC  Final   Culture NO GROWTH 5 DAYS  Final   Report Status 03/18/2015 FINAL  Final  Culture, blood (routine x 2)     Status: None   Collection Time: 03/13/15  1:49 AM  Result Value Ref Range Status   Specimen Description BLOOD LEFT ANTECUBITAL  Final   Special Requests BOTTLES DRAWN AEROBIC ONLY 7CC  Final   Culture NO GROWTH 5 DAYS  Final   Report Status 03/18/2015 FINAL  Final    Scheduled Meds: . antiseptic oral rinse  7 mL Mouth Rinse BID  . diltiazem  30 mg Oral BID  . furosemide  80 mg Intravenous BID  . potassium chloride SA  40 mEq Oral BID  . sodium chloride  3 mL Intravenous Q12H  . sodium chloride  3 mL Intravenous Q12H  . Warfarin - Pharmacist Dosing Inpatient   Does not apply Q24H   Continuous Infusions:   Pamella Pert, MD [facility] Pager [phone]. If 7 PM - 7 AM, please contact night-coverage at [email or web address], password [facility] 03/19/2015, 5:40 PM  LOS: 7 days

## 2015-03-20 DIAGNOSIS — J438 Other emphysema: Secondary | ICD-10-CM

## 2015-03-20 LAB — PROTIME-INR
INR: 2.39 — ABNORMAL HIGH (ref 0.00–1.49)
PROTHROMBIN TIME: 25.8 s — AB (ref 11.6–15.2)

## 2015-03-20 LAB — BASIC METABOLIC PANEL
Anion gap: 8 (ref 5–15)
BUN: 19 mg/dL (ref 6–20)
CO2: 37 mmol/L — ABNORMAL HIGH (ref 22–32)
CREATININE: 1.27 mg/dL — AB (ref 0.44–1.00)
Calcium: 8.7 mg/dL — ABNORMAL LOW (ref 8.9–10.3)
Chloride: 94 mmol/L — ABNORMAL LOW (ref 101–111)
GFR calc Af Amer: 48 mL/min — ABNORMAL LOW (ref >60)
GFR calc non Af Amer: 41 mL/min — ABNORMAL LOW (ref >60)
GLUCOSE: 86 mg/dL (ref 65–99)
Potassium: 3.8 mmol/L (ref 3.5–5.1)
SODIUM: 139 mmol/L (ref 135–145)

## 2015-03-20 MED ORDER — TORSEMIDE 20 MG PO TABS: 20.0000 mg | ORAL_TABLET | ORAL | Status: DC

## 2015-03-20 MED ORDER — TORSEMIDE 20 MG PO TABS: 40.0000 mg | ORAL_TABLET | Freq: Every morning | ORAL | Status: DC

## 2015-03-20 MED ORDER — DILTIAZEM HCL 30 MG PO TABS: 30.0000 mg | ORAL_TABLET | Freq: Two times a day (BID) | ORAL | Status: DC

## 2015-03-20 MED ORDER — WARFARIN SODIUM 5 MG PO TABS: 5.0000 mg | ORAL_TABLET | Freq: Once | ORAL | Status: DC

## 2015-03-20 NOTE — Progress Notes (Signed)
The travel O2 arrived at her room.  I notified Dr. Bevelyn NgoGherhe that the O2 was here.  New orders given and followed.

## 2015-03-20 NOTE — Progress Notes (Signed)
ANTICOAGULATION CONSULT NOTE  Pharmacy Consult for Coumadin Indication: atrial fibrillation  Allergies  Allergen Reactions  . Bee Venom Swelling  . Latex Rash  . Penicillins Rash   Patient Measurements: Height: 5\' 2"  (157.5 cm) Weight: 221 lb 14.4 oz (100.653 kg) IBW/kg (Calculated) : 50.1  Vital Signs: Temp: 98.8 F (37.1 C) (07/17 0501) Temp Source: Oral (07/17 0501) BP: 111/44 mmHg (07/17 0501) Pulse Rate: 64 (07/17 0501)  Labs:  Recent Labs  03/18/15 0703 03/19/15 0645 03/20/15 0623  LABPROT 29.3* 27.3* 25.8*  INR 2.83* 2.57* 2.39*  CREATININE 1.23* 1.21*  --    Estimated Creatinine Clearance: 46.6 mL/min (by C-G formula based on Cr of 1.21).  Medical History: Past Medical History  Diagnosis Date  . Arthritis   . Chronic atrial fibrillation     a. previously on Pradaxa - on Coumadin since valve surgery.  . Aortic stenosis     a. Severe - s/p pericardial AVR with MAZE 02/04/15.  Marland Kitchen. Pneumonia 09/2014  . History of bronchitis 2015  . Joint pain   . Back pain     reason unknown  . Family history of adverse reaction to anesthesia     uncle with MH in the 60's,cousin in the 6170's with MH  . 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 COPD (chronic obstructive pulmonary disease)   . Normal coronary arteries     a. By cath 11/2014.  . Pulmonary hypertension     a. Mod by cath 11/2014.  Marland Kitchen. Chronic diastolic CHF (congestive heart failure)   . Vocal cord paralysis     a. Dx post-AVR in 01/2015. Required PANDA, intubation during that admission.  . Pleural effusion   . AKI (acute kidney injury)     a. H/o AKI after AVR in 01/2015.  .  Tracheomalacia   . Anemia     a. 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   Medications:  Prescriptions prior to admission  Medication Sig Dispense Refill Last Dose  . albuterol (PROAIR HFA) 108 (90 BASE) MCG/ACT inhaler Inhale 2 puffs into the lungs 4 (four) times daily as needed for wheezing or shortness of breath.   03/12/2015 at Unknown time  . albuterol (PROVENTIL) (2.5 MG/3ML) 0.083% nebulizer solution Take 3 mLs (2.5 mg total) by nebulization every 4 (four) hours as needed for wheezing or shortness of breath. 75 mL 12 03/12/2015 at 1900  . amiodarone (PACERONE) 200 MG tablet Take 1 tablet (200 mg total) by mouth daily.   03/12/2015 at Unknown time  . cephALEXin (KEFLEX) 500 MG capsule Take 500 mg by mouth 4 (four) times daily. 10 day course starting on 03/01/2015   03/11/2015 at Unknown time  . potassium chloride SA (K-DUR,KLOR-CON) 20 MEQ tablet Take 1 tablet (20 mEq total) by mouth 2 (two) times daily.   03/12/2015 at Unknown time  . torsemide (DEMADEX) 20 MG tablet Take 2 tablets (40 mg total) by mouth daily. (Patient taking differently: Take 20 mg by mouth 2 (two) times daily. ) 90 tablet 3 03/12/2015 at Unknown time  . traMADol (ULTRAM) 50 MG tablet Take 1 tablet (50 mg total) by mouth every 6 (six) hours as needed (pain).  50 tablet 0 03/12/2015 at Unknown time  . warfarin (COUMADIN) 4 MG tablet Take 1 tablet (4 mg total) by mouth daily at 6 PM. As directed, bases on INR (Patient taking differently: Take 4 mg by mouth daily at 6 PM. Takes one tablet (  total) Mondays through Fridays. *Takes  on Saturdays and Sundays*)   03/11/2015 at 1100a  . warfarin (COUMADIN) 5 MG tablet Take 5 mg by mouth 2 (two) times a week. Takes  on Saturdays and Sundays (*Takes  on all other days)   03/12/2015 at 1100  . amLODipine (NORVASC) 10 MG tablet Take 1 tablet (10 mg total) by mouth daily. (Patient not taking: Reported on 03/12/2015) 30 tablet  Taking  . budesonide (PULMICORT) 0.25 MG/2ML nebulizer solution Take 2 mLs (0.25  mg total) by nebulization every 6 (six) hours. (Patient not taking: Reported on 03/12/2015) 60 mL 12  at 1900  . chlorhexidine (PERIDEX) 0.12 % solution 15 mLs by Mouth Rinse route 2 (two) times daily. (Patient not taking: Reported on 03/12/2015) 120 mL 0 Taking  . food thickener (THICK IT) POWD As needed (Patient not taking: Reported on 03/12/2015)  0 Taking  . predniSONE (DELTASONE) 10 MG tablet Take 1 tablet (10 mg total) by mouth daily with breakfast. (Patient not taking: Reported on 03/12/2015)   Taking   Assessment: 74 yo F on chronic Coumadin for Afib that was started after cardiac surgery in May 2016.  INR was supra-therapeutic on admission & held x 4 days.  Coumadin resumed 7/13.  INR therapeutic today. She has not been established in Coumadin clinic as outpatient yet, however appears INR last checked 03/03/15 as outpatient and it was therapeutic at that time.  Supra-therapeutic INR on admission could be related to acute illness of HF exerbation & PNA.  No bleeding noted.  INR therapeutic but trending down.  Home dosing regimen noted above.  Goal of Therapy:  INR 2-3    Plan:  Coumadin  po today x 1  INR/PT daily Recommend close outpatient monitoring of INR at discharge  Valrie Hart A 03/20/2015,8:19 AM

## 2015-03-20 NOTE — Progress Notes (Signed)
Patient discharged with instructions, prescription, and care notes.  Verbalized understanding via teach back.  IV was removed and the site was WNL. Patient voiced no further complaints or concerns at the time of discharge.  Appointments scheduled per instructions.  Patient left the floor via w/c with staff and family in stable condition.  Pt placed her O2 on and turned the key for usage.  She left the floor with 2 l via n/c prior to discharge.

## 2015-03-20 NOTE — Progress Notes (Addendum)
Late Entry:  O2 saturations completed, charted, and Dr. Elvera LennoxGherghe was notified.  New orders given and followed.  Went in to discuss the plan of care for discharge.  Voiced to the patient that she did qualify for home O2, but I would be able to schedule with [facility] due to it being Sunday and the business closing at 12 N. I told her that I can order through [facility].  Patient stated that that would be fine.  Notified [facility] and spoke with the nurse, d/c summary, face sheet and O2 progress note was faxed.  Asher MuirJamie called me back and stated that she will have everything after I fax the order for )2.  I then faxed the order to her.  She stated that it will probably be this evening before the O2 will arrive.  I voiced this to the patient who wanted to go home and wait for her O2 at home.  Her friend/family stated that the patient would be home by herself.  I notified the MD of the patients intentions and he stated that the patient could not go home without her O2 in place and that if she did so she would have to leave AMA.  I spoke with the patient about this and she verbalized understanding.  She and her ride said they would stay until it arrived.

## 2015-03-20 NOTE — Discharge Instructions (Signed)
CHF  Discharge instructions  DIET  Low sodium Heart Healthy Diet with fluid restriction 1500cc/day  ACTIVITY  Avoid strenuous activity  WEIGH DAILY AT SAME TIME . CALL YOUR DOCTOR IF WEIGHT INCREASES BY MORE THAN 3  POUNDS IN 2 DAYS  CALL YOUR DOCTOR OR COME TO EMERGENCY ROOM IF WORSENING SHORTNESS OF BREATH OR  CHEST PAIN OR SWELLING  F/U with PMD in 1 week to recheck labs and adjust fluid pills as needed  If you smoke cigarettes or use any tobacco products you are advised to stop. Please ask nurse for any written materials  Or additional information you want regarding smoking cessation  Follow with Ernestine ConradBLUTH, [name], MD in 5-7 days  Please get a complete blood count and chemistry panel checked by your Primary MD at your next visit, and again as instructed by your Primary MD. Please get your medications reviewed and adjusted by your Primary MD.  Please request your Primary MD to go over all Hospital Tests and Procedure/Radiological results at the follow up, please get all Hospital records sent to your Prim MD by signing hospital release before you go home.  If you had Pneumonia of Lung problems at the Hospital: Please get a 2 view Chest X ray done in 6-8 weeks after hospital discharge or sooner if instructed by your Primary MD.  If you have Congestive Heart Failure: Please call your Cardiologist or Primary MD anytime you have any of the following symptoms:  1) 3 pound weight gain in 24 hours or 5 pounds in 1 week  2) shortness of breath, with or without a dry hacking cough  3) swelling in the hands, feet or stomach  4) if you have to sleep on extra pillows at night in order to breathe  Follow cardiac low salt diet and 1.5 lit/day fluid restriction.  If you have diabetes Accuchecks 4 times/day, Once in AM empty stomach and then before each meal. Log in all results and show them to your primary doctor at your next visit. If any glucose reading is under 80 or above 300 call your primary MD  immediately.  If you have Seizure/Convulsions/Epilepsy: Please do not drive, operate heavy machinery, participate in activities at heights or participate in high speed sports until you have seen by Primary MD or a Neurologist and advised to do so again.  If you had Gastrointestinal Bleeding: Please ask your Primary MD to check a complete blood count within one week of discharge or at your next visit. Your endoscopic/colonoscopic biopsies that are pending at the time of discharge, will also need to followed by your Primary MD.  Get Medicines reviewed and adjusted. Please take all your medications with you for your next visit with your Primary MD  Please request your Primary MD to go over all hospital tests and procedure/radiological results at the follow up, please ask your Primary MD to get all Hospital records sent to his/her office.  If you experience worsening of your admission symptoms, develop shortness of breath, life threatening emergency, suicidal or homicidal thoughts you must seek medical attention immediately by calling 911 or calling your MD immediately  if symptoms less severe.  You must read complete instructions/literature along with all the possible adverse reactions/side effects for all the Medicines you take and that have been prescribed to you. Take any new Medicines after you have completely understood and accpet all the possible adverse reactions/side effects.   Do not drive or operate heavy machinery when taking Pain medications.  Do not take more than prescribed Pain, Sleep and Anxiety Medications  Special Instructions: If you have smoked or chewed Tobacco  in the last 2 yrs please stop smoking, stop any regular Alcohol  and or any Recreational drug use.  Wear Seat belts while driving.  Please note You were cared for by a hospitalist during your hospital stay. If you have any questions about your discharge medications or the care you received while you were in the  hospital after you are discharged, you can call the unit and asked to speak with the hospitalist on call if the hospitalist that took care of you is not available. Once you are discharged, your primary care physician will handle any further medical issues. Please note that NO REFILLS for any discharge medications will be authorized once you are discharged, as it is imperative that you return to your primary care physician (or establish a relationship with a primary care physician if you do not have one) for your aftercare needs so that they can reassess your need for medications and monitor your lab values.  You can reach the hospitalist office at phone [phone] or fax [phone]   If you do not have a primary care physician, you can call [phone] for a physician referral.  Activity: As tolerated with Full fall precautions use walker/cane & assistance as needed  Diet: heart healthy  Disposition Home

## 2015-03-20 NOTE — Progress Notes (Signed)
SATURATION QUALIFICATIONS: (This note is used to comply with regulatory documentation for home oxygen)  Patient Saturations on Room Air at Rest = 92%  Patient Saturations on Room Air while Ambulating = 88%  Patient Saturations on 2 Liters of oxygen while Ambulating = 97%  Please briefly explain why patient needs home oxygen: Patients sat decreases with activity she is dyspneic on exertion and was only able to tolerate about 20 feet.

## 2015-03-20 NOTE — Discharge Summary (Signed)
Physician Discharge Summary  Sandra AdasRita Turner MVH:[id number] DOB: 1941/07/28 DOA: 03/12/2015  PCP: Ernestine ConradBLUTH, [name], MD  Admit date: 03/12/2015 Discharge date: 03/20/2015  Time spent: > 35 minutes  Recommendations for Outpatient Follow-up:  1. Follow up with Dr. Cornelius Moraswen in 2 days as scheduled 2. Follow up with Cardiology in 1 week 3. Follow up with Dr. 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 Selena BattenKim, 74 yo female with hx of Pafib (CHADS2=2), hx of AS with AVR, apparently c/o dyspnea since her surgery which was worse this evening. Pt notes slight leg swelling bilaterally and slight orthopnea., but denies wt gain. Pt presented to ED and was noted to be anemic with hgb 9.1. Pt denies brbpr, black stool. Pt also noted to have ARF, and on CXR. => Bilateral airspace disease which is asymmetric to the left. Probable small pleural effusions. Pt will be admitted for dyspnea ? CHF vs pneumonia.   Hospital Course:  Acute hypoxic respiratory failure (present on admission) due to Acute on chronic diastolic heart Failure vs HCAP - patient was admitted to the hospital and started on IV diuretic as well as antibiotics for presumed HCAP. She was net -9.5 L and her weight has improved from 237 pounds on admission to 221 pounds on the day of discharge. Her lower extremity edema was  significantly improved, she was able to ambulate better, her pain has resolved, and patient was very insistent that she be discharged home. Cardiology was consulted and have followed patient while hospitalized. She was discharged home in stable condition, on Demadex twice daily, 40 mg in the morning and 20 mg in the afternoon. She was extensively counseled regarding diet, salt intake, fluid restriction as well as daily weights. Patient will have follow-up with cardiothoracic surgery in 2 days, and she will call the cardiology office to get an appointment sometimes next week for a post hospital follow-up visit. COPD - On nasal cannula 2L/min. O2 sats remain 95%  Anemia - Hgb on admission was 9, likely in the setting of chronic disease Acute on chronic renal failure, underlying CKD III - her serum creatinine on admission was 1.8, it improved with diuresis and creatinine on discharge was 1.2 Chronic Atrial fibrillation - Stable, continue diltiazem 30mg  BID, on coumadin  S/p Aortic valve replacement and aortic root dilation - outpatient follow-up with cardiothoracic surgery  Procedures:  2D echo   Consultations:  Cardiology   Discharge Exam: Filed Vitals:   03/20/15 0501 03/20/15 1052 03/20/15 1053 03/20/15 1300  BP: 111/44   130/43  Pulse: 64   80  Temp: 98.8 F (37.1 C)   97.9 F (36.6 C)  TempSrc: Oral     Resp: 22     Height:      Weight: 100.653 kg (221 lb 14.4 oz)     SpO2: 98% 95%  89% 97%   General: NAD Cardiovascular: RRR Respiratory: CTA biL  Discharge Instructions     Medication List    STOP taking these medications        amLODipine 10 MG tablet  Commonly known as:  NORVASC     cephALEXin 500 MG capsule  Commonly known as:  KEFLEX     predniSONE 10 MG tablet  Commonly known as:  DELTASONE      TAKE these medications        PROAIR HFA 108 (90 BASE) MCG/ACT inhaler  Generic drug:  albuterol  Inhale 2 puffs into the lungs 4 (four) times daily as needed for  wheezing or shortness of breath.     albuterol (2.5 MG/3ML) 0.083% nebulizer solution  Commonly known as:  PROVENTIL  Take 3 mLs (2.5 mg total) by nebulization every 4 (four) hours as needed for wheezing or shortness of breath.     amiodarone 200 MG tablet  Commonly known as:  PACERONE  Take 1 tablet (200 mg total) by mouth daily.     budesonide 0.25 MG/2ML nebulizer solution  Commonly known as:  PULMICORT  Take 2 mLs (0.25 mg total) by nebulization every 6 (six) hours.     chlorhexidine 0.12 % solution  Commonly known as:  PERIDEX  15 mLs by Mouth Rinse route 2 (two) times daily.     diltiazem 30 MG tablet  Commonly known as:  CARDIZEM  Take 1 tablet (30 mg total) by mouth 2 (two) times daily.     food thickener Powd  Commonly known as:  THICK IT  As needed     potassium chloride SA 20 MEQ tablet  Commonly known as:  K-DUR,KLOR-CON  Take 1 tablet (20 mEq total) by mouth 2 (two) times daily.     torsemide 20 MG tablet  Commonly known as:  DEMADEX  Take 2 tablets (40 mg total) by mouth AC breakfast.     torsemide 20 MG tablet  Commonly known as:  DEMADEX  Take 1 tablet (20 mg total) by mouth daily after supper.     traMADol 50 MG tablet  Commonly known as:  ULTRAM  Take 1 tablet (50 mg total) by mouth every 6 (six) hours as needed (pain).     warfarin 5 MG tablet  Commonly known as:  COUMADIN  Take 5 mg by mouth 2 (two) times a week. Takes 5mg  on Saturdays and Sundays (*Takes 4mg  on all other days)     warfarin 4 MG tablet  Commonly known as:  COUMADIN  Take 1 tablet (4 mg total) by mouth daily at 6 PM. As directed, bases on INR           Follow-up Information    Follow up with [facility].   Specialty:  Home Health Services   Contact information:   [address] [place] [address] [phone]       Follow up with Joni Reining, NP. Schedule an appointment as soon as possible for a visit in 1 week.   Specialties:  Nurse  Practitioner, Radiology, Cardiology   Contact information:   [address] [place] [address] [phone]       The results of significant diagnostics from this hospitalization (including imaging, microbiology, ancillary and laboratory) are listed below for reference.    Significant Diagnostic Studies: Dg Chest 2 View  03/16/2015   CLINICAL DATA:  Shortness of breath and cough for 3 weeks. Pleural  effusions. Atrial fibrillation. COPD.  EXAM: CHEST  2 VIEW  COMPARISON:  03/12/2015  FINDINGS: Patient has undergone previous aortic valve replacement. Mild cardiomegaly stable. Diffuse pulmonary vascular congestion and interstitial prominence is suspicious for mild interstitial edema. Infiltrate or atelectasis is seen in the lingula, without significant change. Small bilateral pleural effusions appears stable.  IMPRESSION: Mild congestive heart failure and small bilateral pleural effusions, without significant change.  Stable lingular infiltrate versus atelectasis.   Electronically Signed   By: Myles Rosenthal M.D.   On: 03/16/2015 19:36   Dg Chest 2 View  03/03/2015   CLINICAL DATA:  74 year old female with aortic valve replacement, history of pneumonia and atrial fibrillation.  EXAM: CHEST  2 VIEW  COMPARISON:  Chest radiograph dated 02/15/2015  FINDINGS: Single-view of the chest few droplets demonstrate an area increased density the left lower lung field likely atelectatic changes. Superimposed pneumonia not excluded. There are small bilateral pleural effusions, decreased compared to the prior study. There is stable cardiomegaly. Median sternotomy wires, mechanical heart valve and left atrial appendage indication again noted. The osseous structures are otherwise unremarkable.  IMPRESSION: Small bilateral pleural effusions, decreased compared to prior studies.  Left lower lung zone atelectasis/pneumonia. Clinical correlation and follow-up recommended.   Electronically Signed   By: Elgie Collard M.D.    On: 03/03/2015 15:58   US Renal  03/13/2015   CLINICAL DATA:  74 year old female with acute renal failure.  EXAM: RENAL / URINARY TRACT ULTRASOUND COMPLETE  COMPARISON:  None.  FINDINGS: Right Kidney:  Length: 10.6 cm. Echogenicity is upper limits of normal. No mass or hydronephrosis visualized.  Left Kidney:  Length: 10.5 cm. Echogenicity is upper limits of normal. No mass or hydronephrosis visualized.  Bladder:  A Foley catheter is identified and a collapsed bladder.  IMPRESSION: Upper limits of normal renal echogenicity may represent medical renal disease.  No evidence of hydronephrosis.   Electronically Signed   By: Harmon Pier M.D.   On: 03/13/2015 12:37   Dg Chest Port 1 View  03/12/2015   CLINICAL DATA:  Dyspnea  EXAM: PORTABLE CHEST - 1 VIEW  COMPARISON:  03/03/2015  FINDINGS: Bilateral airspace disease which is asymmetric to the left. Probable small pleural effusions.  Stable cardiopericardial enlargement in this patient post aortic valve replacement and left atrial exclusion. Blood flow is cephalized in the vascular pedicle is widened.  IMPRESSION: Bilateral airspace disease which could reflect pneumonia or asymmetric edema.   Electronically Signed   By: Marnee Spring M.D.   On: 03/12/2015 21:51    Microbiology: Recent Results (from the past 240 hour(s))  Culture, blood (routine x 2)     Status: None   Collection Time: 03/13/15  1:31 AM  Result Value Ref Range Status   Specimen Description BLOOD RIGHT HAND  Final   Special Requests BOTTLES DRAWN AEROBIC ONLY 7CC  Final   Culture NO GROWTH 5 DAYS  Final   Report Status 03/18/2015 FINAL  Final  Culture, blood (routine x 2)     Status: None   Collection Time: 03/13/15  1:49 AM  Result Value Ref Range Status   Specimen Description BLOOD LEFT ANTECUBITAL  Final   Special Requests BOTTLES DRAWN AEROBIC ONLY 7CC  Final   Culture NO GROWTH 5 DAYS  Final   Report Status 03/18/2015 FINAL  Final    Labs: Basic Metabolic Panel:  Recent  Labs Lab 03/14/15 0640 03/15/15 1610 03/16/15 0550 03/17/15 9604 03/18/15 0703 03/19/15 0645 03/20/15 5409  NA  139 140 142 143 140 139 139  K 3.8 3.6 3.5 3.9 3.6 3.5 3.8  CL 96* 96* 98* 98* 97* 94* 94*  CO2 33* 33* 36* 36* 35* 35* 37*  GLUCOSE 87 91 93 107* 86 87 86  BUN 35* 28* 23* CREATININE 1.59* 1.40* 1.25* 1.27* 1.23* 1.21* 1.27*  CALCIUM 8.6* 8.4* 8.6* 8.6* 8.5* 8.6* 8.7*   Liver Function Tests:  Recent Labs Lab 03/18/15 0703  AST 20  ALT 18  ALKPHOS 95  BILITOT 0.8  PROT 6.3*  ALBUMIN 2.8*   CBC:  Recent Labs Lab 03/14/15 0640 03/15/15 0652 03/16/15 0550 03/17/15 0643  WBC 5.8 5.9 6.4 6.1  HGB 9.7* 9.3* 9.2* 9.7*  HCT 31.0* 30.0* 29.8* 31.1*  MCV 93.9 94.3 95.2 95.4  PLT 240 232 244 232   BNP: BNP (last 3 results)  Recent Labs  02/07/15 0520 02/12/15 0435 03/12/15 2156  BNP 204.8* 271.9* 178.0*    Signed:  GHERGHE, COSTIN  [facility] 03/20/2015, 5:15 PM

## 2015-03-20 NOTE — Progress Notes (Signed)
Spoke with the Nurse from [facility] tonight spoke with Drinda Buttsnnette who said that they would be resuming care for her tomorrow.

## 2015-03-21 ENCOUNTER — Other Ambulatory Visit: Payer: Self-pay | Admitting: Thoracic Surgery (Cardiothoracic Vascular Surgery)

## 2015-03-21 DIAGNOSIS — I350 Nonrheumatic aortic (valve) stenosis: Secondary | ICD-10-CM

## 2015-03-22 ENCOUNTER — Ambulatory Visit
Admission: RE | Admit: 2015-03-22 | Discharge: 2015-03-22 | Disposition: A | Discharge status: Home / Self Care | Payer: Medicare Other | Source: Ambulatory Visit | Attending: Thoracic Surgery (Cardiothoracic Vascular Surgery) | Admitting: Thoracic Surgery (Cardiothoracic Vascular Surgery)

## 2015-03-22 ENCOUNTER — Encounter: Payer: Self-pay | Admitting: Thoracic Surgery (Cardiothoracic Vascular Surgery)

## 2015-03-22 ENCOUNTER — Ambulatory Visit (INDEPENDENT_AMBULATORY_CARE_PROVIDER_SITE_OTHER): Payer: Self-pay | Admitting: Thoracic Surgery (Cardiothoracic Vascular Surgery)

## 2015-03-22 VITALS — BP 125/60 | HR 73 | Resp 20 | Ht 62.0 in | Wt 221.0 lb

## 2015-03-22 DIAGNOSIS — I482 Chronic atrial fibrillation, unspecified: Secondary | ICD-10-CM

## 2015-03-22 DIAGNOSIS — Z9889 Other specified postprocedural states: Secondary | ICD-10-CM

## 2015-03-22 DIAGNOSIS — Z953 Presence of xenogenic heart valve: Secondary | ICD-10-CM

## 2015-03-22 DIAGNOSIS — Z9289 Personal history of other medical treatment: Secondary | ICD-10-CM

## 2015-03-22 DIAGNOSIS — I350 Nonrheumatic aortic (valve) stenosis: Secondary | ICD-10-CM

## 2015-03-22 DIAGNOSIS — Z952 Presence of prosthetic heart valve: Secondary | ICD-10-CM

## 2015-03-22 DIAGNOSIS — J90 Pleural effusion, not elsewhere classified: Secondary | ICD-10-CM

## 2015-03-22 DIAGNOSIS — Z954 Presence of other heart-valve replacement: Secondary | ICD-10-CM

## 2015-03-22 DIAGNOSIS — Z8679 Personal history of other diseases of the circulatory system: Secondary | ICD-10-CM

## 2015-03-22 NOTE — Progress Notes (Signed)
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  The patient's postoperative recovery was complicated by respiratory failure requiring reintubation on the 12th postoperative day after initially doing fairly well during her early postoperative recovery. The development of respiratory failure was multifactorial an consistent with acute on chronic respiratory failure related to acute exacerbation of chronic diastolic congestive heart failure, atelectasis, and bilateral pleural effusions.  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 The patient was treated aggressively under the care and direction of Dr. Kendrick Fries who also elicited history from the patient's family consistent with likely chronic exposure to fungus and other pathogens at the patient's place of business. 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 The roof of this business collapsed while the patient was in the hospital following her surgery. The patient ultimately responded well to steroid-induced and was eventually extubated from the ventilator. 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 This gradually improved and the patient was ultimately discharged to a local skilled nursing facility in [place]. The patient states that she stayed there approximately 2  weeks and was discharged home, but she was recently rehospitalized at [facility] for appear to 5 days because of acute exacerbation of chronic diastolic congestive heart failure and fluid overload. She responded well to diuretic therapy. Prednisone was discontinued at that time. She was discharged home 3 days ago and returns to our office for follow-up today.  The patient states that she is doing better. She is walking some in getting around reasonably well. She gets short of breath with activity. She has had some lower extremity edema. She is weighing herself daily and keeping a log. Her appetite is good. She no longer has any significant pain in her chest. She has not had any palpitations or dizzy spells. She remains chronically anticoagulated with warfarin.   Current Outpatient Prescriptions  Medication Sig Dispense Refill  . albuterol (PROAIR HFA) 108 (90 BASE) MCG/ACT inhaler Inhale 2 puffs into the lungs 4 (four) times daily as needed for wheezing or shortness of breath.    Marland Kitchen albuterol (PROVENTIL) (2.5 MG/3ML) 0.083% nebulizer solution Take 3 mLs (2.5 mg total) by nebulization every 4 (four) hours as needed for wheezing or shortness of breath. 75 mL 12  . amiodarone (PACERONE) 200 MG tablet Take 1 tablet (200 mg total) by mouth daily.    . budesonide (PULMICORT) 0.25 MG/2ML nebulizer solution Take 2 mLs (0.25 mg total) by nebulization every 6 (six) hours. 60 mL 12  . diltiazem (CARDIZEM) 30 MG tablet Take 1 tablet (30 mg total) by mouth 2 (two) times daily. 60 tablet 2  . potassium chloride SA (K-DUR,KLOR-CON) 20 MEQ tablet Take 1 tablet (20 mEq total) by mouth 2 (two) times daily.    Marland Kitchen torsemide (DEMADEX) 20 MG tablet  Take 2 tablets (40 mg total) by mouth AC breakfast. 90 tablet 3  . torsemide (DEMADEX) 20 MG tablet Take 1 tablet (20 mg total) by mouth daily after supper. 30 tablet 0  . traMADol (ULTRAM) 50 MG tablet Take 1 tablet (50 mg total) by mouth every 6 (six) hours as needed  (pain). 50 tablet 0  . warfarin (COUMADIN) 4 MG tablet Take 1 tablet (4 mg total) by mouth daily at 6 PM. As directed, bases on INR (Patient taking differently: Take 4 mg by mouth daily at 6 PM. Takes one tablet (4mg  total) Mondays through Fridays. *Takes 5mg  on Saturdays and Sundays*)    . warfarin (COUMADIN) 5 MG tablet Take 5 mg by mouth 2 (two) times a week. Takes 5mg  on Saturdays and Sundays (*Takes 4mg  on all other days)     No current facility-administered medications for this visit.      Physical Exam:   BP 125/60 mmHg  Pulse 73  Resp 20  Ht 5\' 2"  (1.575 m)  Wt 221 lb (100.245 kg)  BMI 40.41 kg/m2  SpO2 91%  General:  Obese in no distress  Chest:   Clear to auscultation  CV:   Irregular rate and rhythm  Incisions:  Sternotomy is healed nicely and sternum is stable  Abdomen:  Soft nontender  Extremities:  Warm and well perfused with mild lower extremity edema  Diagnostic Tests:   2 channel telemetry rhythm strip performed in our office today demonstrates atrial fibrillation with a controlled ventricular rate    Transthoracic Echocardiography  Patient:  Maisyn, Nouri MR #:    [id number] Study Date: 03/16/2015 Gender:   F Age:    74 Height:   157.5 cm Weight:   105.7 kg BSA:    2.21 m^2 Pt. Status: Room:    [address]  [name] [id number]  OZH, YQMVH [id number] [name], [name] REFERRING  [place], [place] M PERFORMING  [facility], Jeani Hawking SONOGRAPHER Thurman Coyer  cc:  ------------------------------------------------------------------- LV EF: 55% -  60%  ------------------------------------------------------------------- Indications:   Dyspnea 786.09.  ------------------------------------------------------------------- History:  PMH:  Atrial fibrillation. Aortic valve disease. Chronic obstructive pulmonary  disease.  ------------------------------------------------------------------- Study Conclusions  - Left ventricle: The cavity size was normal. Wall thickness was increased in a pattern of mild LVH. Systolic function was normal. The estimated ejection fraction was in the range of 55% to 60%. - Mitral valve: Mildly calcified annulus. Normal thickness leaflets . There was mild regurgitation. - Left atrium: The atrium was severely dilated. - Right ventricle: The cavity size was moderately dilated. It shares the apex with the LV. Systolic function was moderately to severely reduced. RV TAPSE is 1.0 cm. Tricuspid anular systolic tissue velocity is 7 cm/s. - Right atrium: The atrium was severely dilated. - Tricuspid valve: There was mild-moderate regurgitation. - Pulmonary arteries: Systolic pressure was moderately increased. PA peak pressure: 44 mm Hg (S). - Pericardium, extracardiac: There is a large left pleural effusion. - Technically difficult study.  ------------------------------------------------------------------- Labs, prior tests, procedures, and surgery: Aortic valve replacement with a bioprosthetic valve.  Transthoracic echocardiography. M-mode, complete 2D, spectral Doppler, and color Doppler. Birthdate: Patient birthdate: Jun 19, 1941. Age: Patient is 74 yr old. Sex: Gender: female. BMI: 42.6 kg/m^2. Blood pressure:   139/52 Patient status: Inpatient. Study date: Study date: 03/16/2015. Study time: 02:12 PM. Location: Bedside.  -------------------------------------------------------------------  ------------------------------------------------------------------- Left ventricle: The cavity size was normal. Wall thickness was increased in a pattern of mild LVH. Systolic  function was normal. The estimated ejection fraction was in the range of 55% to 60%. Indeterminate diastolic function. Septal motion is consistent with prior cardiac  surgery. Images were inadequate for LV wall motion assessment.  ------------------------------------------------------------------- Aortic valve: There is a 23 mm [facility] Ease bovine pericardial tissue valve in the AV position. Poorly visualized. Doppler:  There was no stenosis.  There was no significant regurgitation.  VTI ratio of LVOT to aortic valve: 0.47. Mean velocity ratio of LVOT to aortic valve: 0.51.  Mean gradient (S): 13 mm Hg. Peak gradient (S): 28 mm Hg.  ------------------------------------------------------------------- Aorta: Aortic root: The aortic root was normal in size.  ------------------------------------------------------------------- Mitral valve:  Mildly calcified annulus. Normal thickness leaflets . Doppler:  There was no evidence for stenosis.  There was mild regurgitation.  Mean gradient (D): 6 mm Hg. Peak gradient (D): 12 mm Hg.  ------------------------------------------------------------------- Left atrium: The atrium was severely dilated.  ------------------------------------------------------------------- Right ventricle: The cavity size was moderately dilated. It shares the apex with the LV. Systolic function was moderately to severely reduced. RV TAPSE is 1.0 cm. Tricuspid anular systolic tissue velocity is 7 cm/s.  ------------------------------------------------------------------- Pulmonic valve:  Not well visualized. Doppler:  There was no evidence for stenosis.  There was no significant regurgitation.  ------------------------------------------------------------------- Tricuspid valve:  Normal thickness leaflets. Doppler:  There was no evidence for stenosis.  There was mild-moderate regurgitation.  ------------------------------------------------------------------- Pulmonary artery:  Systolic pressure was moderately increased.  ------------------------------------------------------------------- Right  atrium: The atrium was severely dilated.  ------------------------------------------------------------------- Pericardium: There is a large left pleural effusion.  ------------------------------------------------------------------- Systemic veins: Inferior vena cava: The vessel was normal in size. The respirophasic diameter changes were in the normal range (= 50%), consistent with normal central venous pressure.  ------------------------------------------------------------------- Measurements  Left ventricle             Value     Reference LV ID, ED, PLAX chordal        43.8  mm   43 - 52 LV ID, ES, PLAX chordal        26   mm   23 - 38 LV fx shortening, PLAX chordal     41   %   >=29 LV PW thickness, ED          10   mm   --------- IVS/LV PW ratio, ED          1       <=1.3 LV ejection time            350  ms   ---------  Ventricular septum           Value     Reference IVS thickness, ED           10   mm   ---------  LVOT                  Value     Reference LVOT ID, S               18   mm   --------- LVOT area               2.54  cm^2  --------- LVOT peak velocity, S         127.8 cm/s  --------- LVOT mean velocity, S         82.8  cm/s  --------- LVOT VTI, S              26.21  cm   --------- LVOT peak gradient, S         7   mm Hg ---------  Aortic valve              Value     Reference Aortic valve peak velocity, S     266.82 cm/s  --------- Aortic valve mean velocity, S     162.22 cm/s  --------- Aortic valve VTI, S          55.69 cm   --------- Aortic mean gradient, S        13   mm Hg --------- Aortic peak gradient, S        28   mm Hg --------- VTI  ratio, LVOT/AV           0.47     --------- Velocity ratio, mean, LVOT/AV     0.51     ---------  Aorta                 Value     Reference Aortic root ID, ED           24   mm   ---------  Left atrium              Value     Reference LA ID, A-P, ES             54   mm   --------- LA ID/bsa, A-P         (H)   2.44  cm/m^2 <=2.2 LA volume/bsa, S            41.1  ml/m^2 ---------  Mitral valve              Value     Reference Mitral E-wave peak velocity      172  cm/s  --------- Mitral mean velocity, D        115  cm/s  --------- Mitral deceleration time    (H)   285  ms   150 - 230 Mitral mean gradient, D        6   mm Hg --------- Mitral peak gradient, D        12   mm Hg ---------  Pulmonary arteries           Value     Reference PA pressure, S, DP       (H)   44   mm Hg <=30  Legend: (L) and (H) mark values outside specified reference range.  ------------------------------------------------------------------- Prepared and Electronically Authenticated by  Patrick JupiterJonathon Branch, M.D. 2016-07-13T16:34:56    CHEST 2 VIEW  COMPARISON: 03/16/2015  FINDINGS: Moderate cardiac enlargement and vascular congestion. Mild diffuse interstitial prominence. Areas of patchy densities bilateral mid to lower lung zones appear most consistent with probable atelectasis. Blunting bilateral costophrenic angles suggests small effusions. Significant thoracolumbar compression deformity stable. Replacement aortic valve noted.  IMPRESSION: Mild congestive heart failure with pulmonary edema and small effusions as well as bibasilar atelectasis.   Electronically Signed  By: Esperanza Heiraymond Rubner M.D.  On: 03/22/2015 13:42    Impression:  The patient appears  clinically stable at this time, although she was recently hospitalized for 5 days at [facility] with an acute exacerbation of chronic diastolic congestive heart failure and fluid overload. She reportedly responded well to intravenous diuretic therapy.  Prednisone was discontinued during her recent hospitalization.  Her voice has essentially returned to normal and I suspect that her vocal cord paralysis  has resolved. Recent follow-up transthoracic echocardiogram looks good with normal functioning bioprosthetic tissue valve in the aortic position and normal left ventricular systolic function. The patient remains in rate-controlled persistent atrial fibrillation. She is currently anticoagulated using warfarin.    Plan:   We have not recommended any changes to the patient's current medications at this time.  I have encouraged the patient to continue to gradually increase her physical activity as tolerated. She tells me that home health physical therapy has been arranged, and once this has been completed I have strongly encouraged the patient to enroll in the outpatient cardiac rehabilitation program.   I have reminded the patient that she needs to schedule a follow-up appointment with Dr. Kendrick Fries because of her underlying chronic lung disease with possible hypersensitivity pneumonitis.  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  The patient will continue to follow-up with [name] and Joni Reining at the [facility] office in [place].  She hopes to be taken off of Coumadin and I think it would be reasonable to put her back on Pradaxa in the near future. Once she has completely recovered it might be reasonable to consider an attempt at DC cardioversion if she remains in atrial fibrillation because she underwent maze procedure at the time of her surgery 2 months  ago.  The patient will return to our office for routine follow-up and rhythm check in 4 months.   Salvatore Decent. Cornelius Moras, MD 03/22/2015 4:06 PM

## 2015-03-22 NOTE — Patient Instructions (Signed)
The patient is encouraged to enroll and participate in the outpatient cardiac rehab program beginning as soon as practical once home health physical therapy has been discontinued  Schedule follow up appointment in Pulmonary Medicine clinic as soon as practical  The patient should continue all previous medications without changes at this time

## 2015-03-28 ENCOUNTER — Ambulatory Visit (INDEPENDENT_AMBULATORY_CARE_PROVIDER_SITE_OTHER): Payer: Medicare Other | Admitting: Adult Health

## 2015-03-28 ENCOUNTER — Encounter: Payer: Self-pay | Admitting: Adult Health

## 2015-03-28 VITALS — BP 122/60 | HR 71 | Ht 62.0 in | Wt 216.0 lb

## 2015-03-28 DIAGNOSIS — I481 Persistent atrial fibrillation: Secondary | ICD-10-CM

## 2015-03-28 DIAGNOSIS — Z79899 Other long term (current) drug therapy: Secondary | ICD-10-CM

## 2015-03-28 DIAGNOSIS — I4819 Other persistent atrial fibrillation: Secondary | ICD-10-CM

## 2015-03-28 DIAGNOSIS — I5031 Acute diastolic (congestive) heart failure: Secondary | ICD-10-CM

## 2015-03-28 MED ORDER — POTASSIUM CHLORIDE ER 10 MEQ PO TBCR: 20.0000 meq | EXTENDED_RELEASE_TABLET | Freq: Every day | ORAL | Status: DC

## 2015-03-28 NOTE — Progress Notes (Deleted)
Name: Sandra Turner    DOB: [birth year]  Age: 74 y.o.  MR#: [id number]       PCP:  [name], MD      Insurance: Payor: [employer] MEDICARE / Plan: [facility] MEDICARE / Product Type: *No Product type* /   CC:    Chief Complaint  Patient presents with  . Congestive Heart Failure  . Aortic Stenosis    Status post aortic valve replacement with bioprosthetic valve    VS Filed Vitals:   03/28/15 1449  BP: 122/60  Pulse: 71  Height: 5\' 2"  (1.575 m)  Weight: 216 lb (97.977 kg)  SpO2: 98%    Weights Current Weight  03/28/15 216 lb (97.977 kg)  03/22/15 221 lb (100.245 kg)  03/20/15 221 lb 14.4 oz (100.653 kg)    Blood Pressure  BP Readings from Last 3 Encounters:  03/28/15 122/60  03/22/15 125/60  03/20/15 130/43     Admit date:  (Not on file) Last encounter with RMR:  03/03/2015   Allergy Bee venom; Latex; and Penicillins  Current Outpatient Prescriptions  Medication Sig Dispense Refill  . albuterol (PROAIR HFA) 108 (90 BASE) MCG/ACT inhaler Inhale 2 puffs into the lungs 4 (four) times daily as needed for wheezing or shortness of breath.    Marland Kitchen albuterol (PROVENTIL) (2.5 MG/3ML) 0.083% nebulizer solution Take 3 mLs (2.5 mg total) by nebulization every 4 (four) hours as needed for wheezing or shortness of breath. 75 mL 12  . amiodarone (PACERONE) 200 MG tablet Take 1 tablet (200 mg total) by mouth daily.    . budesonide (PULMICORT) 0.25 MG/2ML nebulizer solution Take 2 mLs (0.25 mg total) by nebulization every 6 (six) hours. 60 mL 12  . diltiazem (CARDIZEM) 30 MG tablet Take 1 tablet (30 mg total) by mouth 2 (two) times daily. 60 tablet 2  . torsemide (DEMADEX) 20 MG tablet Take 2 tablets (40 mg total) by mouth AC breakfast. 90 tablet 3  . torsemide (DEMADEX) 20 MG tablet Take 1 tablet (20 mg total) by mouth daily after supper. 30 tablet 0  . traMADol (ULTRAM) 50 MG tablet Take 1 tablet (50 mg total) by mouth every 6 (six) hours as needed (pain). 50 tablet 0  . warfarin  (COUMADIN) 4 MG tablet Take 1 tablet (4 mg total) by mouth daily at 6 PM. As directed, bases on INR (Patient taking differently: Take 4 mg by mouth daily at 6 PM. Takes one tablet (4mg  total) Mondays through Fridays. *Takes 5mg  on Saturdays and Sundays*)    . warfarin (COUMADIN) 5 MG tablet Take 5 mg by mouth 2 (two) times a week. Takes 5mg  on Saturdays and Sundays (*Takes 4mg  on all other days)    . potassium chloride SA (K-DUR,KLOR-CON) 20 MEQ tablet Take 1 tablet (20 mEq total) by mouth 2 (two) times daily. (Patient not taking: Reported on 03/28/2015)     No current facility-administered medications for this visit.    Discontinued Meds:   There are no discontinued medications.  Patient Active Problem List   Diagnosis Date Noted  . Congestive heart disease   . Acute on chronic diastolic heart failure 03/14/2015  . CHF (congestive heart failure) 03/12/2015  . Anemia 03/12/2015  . ARF (acute renal failure) 03/12/2015  . Dyspnea 03/12/2015  . 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 Pressure ulcer 02/01/2015  . Asthmatic bronchitis with acute exacerbation 01/31/2015  . Mucus plugging of bronchi 01/31/2015  . Atelectasis   . Shortness of breath   . Pleural effusion   .  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 S/P Maze operation for atrial fibrillation 01/19/2015  . Severe aortic stenosis 11/22/2014  . Acute respiratory failure with hypoxia 09/11/2014  . Chronic bronchitis with acute exacerbation 09/11/2014  . COPD exacerbation 09/11/2014  . Family history of malignant hyperthermia 04/26/2014  . COPD (chronic obstructive pulmonary disease) 10/26/2013  . Critical aortic valve stenosis 08/25/2012  . Pulmonary hypertension 08/25/2012  . Atrial fibrillation 08/22/2012  . Obesity 08/22/2012    LABS    Component Value Date/Time   NA 139 03/20/2015 0623   NA 139 03/19/2015 0645   NA 140 03/18/2015 0703   K 3.8 03/20/2015 0623   K 3.5  03/19/2015 0645   K 3.6 03/18/2015 0703   CL 94* 03/20/2015 0623   CL 94* 03/19/2015 0645   CL 97* 03/18/2015 0703   CO2 37* 03/20/2015 0623   CO2 35* 03/19/2015 0645   CO2 35* 03/18/2015 0703   GLUCOSE 86 03/20/2015 0623   GLUCOSE 87 03/19/2015 0645   GLUCOSE 86 03/18/2015 0703   BUN 19 03/20/2015 0623   BUN 19 03/19/2015 0645   BUN 19 03/18/2015 0703   CREATININE 1.27* 03/20/2015 0623   CREATININE 1.21* 03/19/2015 0645   CREATININE 1.23* 03/18/2015 0703   CALCIUM 8.7* 03/20/2015 0623   CALCIUM 8.6* 03/19/2015 0645   CALCIUM 8.5* 03/18/2015 0703   GFRNONAA 41* 03/20/2015 0623   GFRNONAA 44* 03/19/2015 0645   GFRNONAA 43* 03/18/2015 0703   GFRAA 48* 03/20/2015 0623   GFRAA 51* 03/19/2015 0645   GFRAA 50* 03/18/2015 0703   CMP     Component Value Date/Time   NA 139 03/20/2015 0623   K 3.8 03/20/2015 0623   CL 94* 03/20/2015 0623   CO2 37* 03/20/2015 0623   GLUCOSE 86 03/20/2015 0623   BUN 19 03/20/2015 0623   CREATININE 1.27* 03/20/2015 0623   CALCIUM 8.7* 03/20/2015 0623   PROT 6.3* 03/18/2015 0703   ALBUMIN 2.8* 03/18/2015 0703   AST 20 03/18/2015 0703   ALT 18 03/18/2015 0703   ALKPHOS 95 03/18/2015 0703   BILITOT 0.8 03/18/2015 0703   GFRNONAA 41* 03/20/2015 0623   GFRAA 48* 03/20/2015 0623       Component Value Date/Time   WBC 6.1 03/17/2015 0643   WBC 6.4 03/16/2015 0550   WBC 5.9 03/15/2015 0652   HGB 9.7* 03/17/2015 0643   HGB 9.2* 03/16/2015 0550   HGB 9.3* 03/15/2015 0652   HCT 31.1* 03/17/2015 0643   HCT 29.8* 03/16/2015 0550   HCT 30.0* 03/15/2015 0652   MCV 95.4 03/17/2015 0643   MCV 95.2 03/16/2015 0550   MCV 94.3 03/15/2015 0652    Lipid Panel  No results found for: CHOL, TRIG, HDL, CHOLHDL, VLDL, LDLCALC, LDLDIRECT  ABG    Component Value Date/Time   PHART 7.507* 02/05/2015 0442   PCO2ART 50.1* 02/05/2015 0442   PO2ART 85.0 02/05/2015 0442   HCO3 39.8* 02/05/2015 0442   TCO2 41 02/05/2015 0442   ACIDBASEDEF TEST WILL BE CREDITED  01/20/2015 0930   O2SAT 97.0 02/05/2015 0442     Lab Results  Component Value Date   TSH 4.069 03/13/2015   BNP (last 3 results)  Recent Labs  02/07/15 0520 02/12/15 0435 03/12/15 2156  BNP 204.8* 271.9* 178.0*    ProBNP (last 3 results) No results for input(s): PROBNP in the last 8760 hours.  Cardiac Panel (last 3 results) No results for input(s): CKTOTAL, CKMB, TROPONINI,  RELINDX in the last 72 hours.  Iron/TIBC/Ferritin/ %Sat    Component Value Date/Time   IRON 34 03/13/2015 0615   TIBC 279 03/13/2015 0615   FERRITIN 195 03/13/2015 0615   IRONPCTSAT 12 03/13/2015 0615     EKG Orders placed or performed during the hospital encounter of 03/12/15  . EKG 12-Lead  . EKG 12-Lead  . EKG 12-Lead  . EKG 12-Lead  . EKG 12-Lead  . EKG 12-Lead     Prior Assessment and Plan Problem List as of 03/28/2015      Cardiovascular and Mediastinum   Atrial fibrillation   Last Assessment & Plan 08/16/2014 Office Visit Written 08/16/2014  1:03 PM by [name], MD    She continues on Pradaxa without reported bleeding problems.      Critical aortic valve stenosis   Last Assessment & Plan 08/16/2014 Office Visit Written 08/16/2014  1:02 PM by [name], MD    Severe calcific aortic stenosis. Patient has associated lung disease with moderate ventilatory defect and airway obstruction, severe pulmonary hypertension, atrial fibrillation, and also reported family history of malignant hyperthermia resulting in 2 deaths with surgery. She is symptomatically stable at this point. Follow-up echocardiogram will be obtained, and she should keep her follow-up with Dr. [name] in the multidisciplinary valve clinic.       Pulmonary hypertension   Severe aortic stenosis   CHF (congestive heart failure)   Acute on chronic diastolic heart failure   Congestive heart disease     Respiratory   COPD (chronic obstructive pulmonary disease)   Last Assessment & Plan 10/26/2013 Office  Visit Written 10/26/2013  3:58 PM by [name], MD    Reports chronic problems with bronchitis. Followup PFTs to be arranged.      Acute respiratory failure with hypoxia   Chronic bronchitis with acute exacerbation   COPD exacerbation   Atelectasis   Pleural effusion   Asthmatic bronchitis with acute exacerbation   Mucus plugging of bronchi   Tracheomalacia     Musculoskeletal and Integument   Pressure ulcer     Genitourinary   ARF (acute renal failure)     Other   Obesity   Family history of malignant hyperthermia   Last Assessment & Plan 04/26/2014 Office Visit Written 04/26/2014  2:42 PM by [name], MD    I continue to recommend that she get screened and have provided her the information to make contact with a center that performs this testing. Frankly, I am not certain whether our anesthesia group can help with this or not. Clearly, this will need to be investigated before she is considered for any type of open aortic valve repair.      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 Mild diffuse interstitial prominence. Areas of patchy densities bilateral mid to lower lung zones appear most consistent with probable atelectasis. Blunting bilateral costophrenic angles suggests small effusions. Significant thoracolumbar compression deformity stable. Replacement aortic valve noted.  IMPRESSION: Mild congestive heart failure with pulmonary edema and small effusions as well as bibasilar atelectasis.   Electronically Signed  By: Esperanza Heir M.D.   On: 03/22/2015 13:42   Dg Chest 2 View  03/20/2015   ADDENDUM  REPORT: 03/20/2015 17:19  ADDENDUM: Please note the is a two view radiograph of the chest. On the lateral projection fluid is noted within the fissure.   Electronically Signed   By: Elgie Collard M.D.   On: 03/20/2015 17:19   03/20/2015   CLINICAL DATA:  74 year old female with aortic valve replacement, history of pneumonia and atrial fibrillation.  EXAM: CHEST  2 VIEW  COMPARISON:  Chest radiograph dated 02/15/2015  FINDINGS: Single-view of the chest few droplets demonstrate an area increased density the left lower lung field likely atelectatic changes. Superimposed pneumonia not excluded. There are small bilateral pleural effusions, decreased compared to the prior study. There is stable cardiomegaly. Median sternotomy wires, mechanical heart valve and left atrial appendage indication again noted. The osseous structures are otherwise unremarkable.  IMPRESSION: Small bilateral pleural effusions, decreased compared to prior studies.  Left lower lung zone atelectasis/pneumonia. Clinical correlation and follow-up recommended.  Electronically Signed: By: Elgie Collard M.D. On: 03/03/2015 15:58   Dg Chest 2 View  03/16/2015   CLINICAL DATA:  Shortness of breath and cough for 3 weeks. Pleural effusions. Atrial fibrillation. COPD.  EXAM: CHEST  2 VIEW  COMPARISON:  03/12/2015  FINDINGS: Patient has undergone previous aortic valve replacement. Mild cardiomegaly stable. Diffuse pulmonary vascular congestion and interstitial prominence is suspicious for mild interstitial edema. Infiltrate or atelectasis is seen in the lingula, without significant change. Small bilateral pleural effusions appears stable.  IMPRESSION: Mild congestive heart failure and small bilateral pleural effusions, without significant change.  Stable lingular infiltrate versus atelectasis.   Electronically Signed   By: Myles Rosenthal M.D.   On: 03/16/2015 19:36   US Renal  03/13/2015   CLINICAL DATA:  73 year old female with acute renal failure.   EXAM: RENAL / URINARY TRACT ULTRASOUND COMPLETE  COMPARISON:  None.  FINDINGS: Right Kidney:  Length: 10.6 cm. Echogenicity is upper limits of normal. No mass or hydronephrosis visualized.  Left Kidney:  Length: 10.5 cm. Echogenicity is upper limits of normal. No mass or hydronephrosis visualized.  Bladder:  A Foley catheter is identified and a collapsed bladder.  IMPRESSION: Upper limits of normal renal echogenicity may represent medical renal disease.  No evidence of hydronephrosis.   Electronically Signed   By: Harmon Pier M.D.   On: 03/13/2015 12:37   Dg Chest Port 1 View  03/12/2015   CLINICAL DATA:  Dyspnea  EXAM: PORTABLE CHEST - 1 VIEW  COMPARISON:  03/03/2015  FINDINGS: Bilateral airspace disease which is asymmetric to the left. Probable small pleural effusions.  Stable cardiopericardial enlargement in this patient post aortic valve replacement and left atrial exclusion. Blood flow is cephalized in the vascular pedicle is widened.  IMPRESSION: Bilateral airspace disease which could reflect pneumonia or asymmetric edema.   Electronically Signed   By: Marnee Spring M.D.   On: 03/12/2015 21:51

## 2015-03-28 NOTE — Patient Instructions (Signed)
Your physician recommends that you schedule a follow-up appointment in: 1 month with Harriet Pho, NP.  Your physician has recommended you make the following change in your medication:    Start Taking Potassium 2 Tablets Daily   Your physician recommends that you return for lab work in: Just Before your next visit. (BMET)  Thank you for choosing [facility]!

## 2015-03-28 NOTE — Progress Notes (Signed)
Cardiology Office Note   Date:  03/28/2015   ID:  Sandra Turner, DOB 07-09-1941, MRN [id number]  PCP:  [name], MD  Cardiologist:  [place]/ Joni Reining, NP   Chief Complaint  Patient presents with  . Congestive Heart Failure  . Aortic Stenosis    Status post aortic valve replacement with bioprosthetic valve      History of Present Illness: Sandra Turner is a 74 y.o. female who presents for ongoing assessment and management of chronic diastolic heart failure,aortic valve disease, status post aortic valve replacement, bioprosthetic aortic root enlargement, status post maze operation for atrial fibrillation, with history of chronic dyspnea related to COPD.  The patient was hospitalized on 03/12/2015 for diuresis in the setting of decompensated CHF and also treated for community HCAP.  She is diuresis 9.5 liters with discharge weight of 221 pounds.  Donato Heinz D. Most significantly in proved, and she was up ambulating.  She is discharged on torsemide 40 mg twice a day 40 mg in the morning and 20 mg in the afternoon.  She was given strong counseling concerning low salt diet.  Daily weights.  She was also to continue her followup evaluation with cardiothoracic surgery.  On followup with cardiothoracic surgery.  She appeared to be stable.  Her weight in their office was 221 pounds.  She was continuing her medication therapy and was adherent to that.  She has been released by cardiothoracic surgery and advised to continue with cardiology and pulmonology for ongoing management of her chronic diseases.  He was advised by cardiothoracic surgery that Coumadin be discontinued and she be restarted on Pradaxa.  She has chronic kidney disease, and this will need to be reevaluated on discharge, the patient's creatinine was 1.27.  She is without complaint today and her weight is been maintained.  She is still struggling with salt intake.  She has a friend, who does her grocery shopping, and has been  tried to be better about the choices in the low sodium diet.  The patient is convinced that carves her what is causing her to retain fluid.  She has been medically compliant.  She continues on Coumadin therapy, but wishes to change to a Pentax.  She states that she was told by cardiology that she will need to wait a month before restarting.  `C  Past Medical History  Diagnosis Date  . Arthritis   . Chronic atrial fibrillation     a. previously on Pradaxa - on Coumadin since valve surgery.  . Aortic stenosis     a. Severe - s/p pericardial AVR with MAZE 02/04/15.  Marland Kitchen Pneumonia 09/2014  . History of bronchitis 2015  . Joint pain   . Back pain     reason unknown  . Family history of adverse reaction to anesthesia     uncle with MH in the 60's,cousin in the 45's with MH  . 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 COPD (chronic obstructive pulmonary disease)   . Normal coronary arteries     a. By cath 11/2014.  . Pulmonary hypertension     a. Mod by cath 11/2014.  Marland Kitchen Chronic diastolic CHF (congestive heart failure)   .  Vocal cord paralysis     a. Dx post-AVR in 01/2015. Required PANDA, intubation during that admission.  . Pleural effusion   . AKI (acute kidney injury)     a. H/o AKI after AVR in 01/2015.  . Tracheomalacia   . Anemia     a. 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    Past Surgical History  Procedure Laterality Date  . None    . Left and right heart catheterization with coronary angiogram N/A 11/01/2014    Procedure: LEFT AND RIGHT HEART CATHETERIZATION WITH CORONARY ANGIOGRAM;  Surgeon: [name], MD;  Location: [facility] CATH LAB;  Service:  Cardiovascular;  Laterality: N/A;  . Aortic valve replacement N/A 01/19/2015    Procedure: AORTIC VALVE REPLACEMENT (AVR);  Surgeon: Purcell Nails, MD;  Location: [facility] OR;  Service: Open Heart Surgery;  Laterality: N/A;  . Maze N/A 01/19/2015    Procedure: MAZE;  Surgeon: Purcell Nails, MD;  Location[facility] OR;  Service: Open Heart Surgery;  Laterality: N/A;  . Aortic root enlargement N/A 01/19/2015    Procedure:  AORTIC ROOT ENLARGEMENT;  Surgeon: Purcell Nails, MD;  Location[facility] OR;  Service: Open Heart Surgery;  Laterality: N/A;  . Tee without cardioversion N/A 01/19/2015    Procedure: TRANSESOPHAGEAL ECHOCARDIOGRAM (TEE);  Surgeon: Purcell Nails, MD;  Location: [facility] OR;  Service: Open Heart Surgery;  Laterality: N/A;     Current Outpatient Prescriptions  Medication Sig Dispense Refill  . albuterol (PROAIR HFA) 108 (90 BASE) MCG/ACT inhaler Inhale 2 puffs into the lungs 4 (four) times daily as needed for wheezing or shortness of breath.    Marland Kitchen albuterol (PROVENTIL) (2.5 MG/3ML) 0.083% nebulizer solution Take 3 mLs (2.5 mg total) by nebulization every 4 (four) hours as needed for wheezing or shortness of breath. 75 mL 12  . amiodarone (PACERONE) 200 MG tablet Take 1 tablet (200 mg total) by mouth daily.    . budesonide (PULMICORT) 0.25 MG/2ML nebulizer solution Take 2 mLs (0.25 mg total) by nebulization every 6 (six) hours. 60 mL 12  . diltiazem (CARDIZEM) 30 MG tablet Take 1 tablet (30 mg total) by mouth 2 (two) times daily. 60 tablet 2  . torsemide (DEMADEX) 20 MG tablet Take 2 tablets (40 mg total) by mouth AC breakfast. 90 tablet 3  . torsemide (DEMADEX) 20 MG tablet Take 1 tablet (20 mg total) by mouth daily after supper. 30 tablet 0  . traMADol (ULTRAM) 50 MG tablet Take 1 tablet (50 mg total) by mouth every 6 (six) hours as needed (pain). 50 tablet 0  . warfarin (COUMADIN) 4 MG tablet Take 1 tablet (4 mg total) by mouth daily at 6 PM. As directed, bases on INR (Patient taking differently:  Take 4 mg by mouth daily at 6 PM. Takes one tablet (4mg  total) Mondays through Fridays. *Takes 5mg  on Saturdays and Sundays*)    . warfarin (COUMADIN) 5 MG tablet Take 5 mg by mouth 2 (two) times a week. Takes 5mg  on Saturdays and Sundays (*Takes 4mg  on all other days)    . potassium chloride SA (K-DUR,KLOR-CON) 20 MEQ tablet Take 1 tablet (20 mEq total) by mouth 2 (two) times daily. (Patient not taking: Reported on 03/28/2015)     No current facility-administered medications for this visit.    Allergies:   Bee venom; Latex; and Penicillins    Social History:  The patient  reports that she has never smoked. She does not have any smokeless tobacco history on  file. She reports that she does not drink alcohol or use illicit drugs.   Family History:  The patient's family history includes Breast cancer in her mother; Cancer in her father; Congestive Heart Failure in her mother; Malignant hyperthermia in her cousin.    ROS: .   All other systems are reviewed and negative.Unless otherwise mentioned in H&P above.   PHYSICAL EXAM: VS:  BP 122/60 mmHg  Pulse 71  Ht 5\' 2"  (1.575 m)  Wt 216 lb (97.977 kg)  BMI 39.50 kg/m2  SpO2 98% , BMI Body mass index is 39.5 kg/(m^2). GEN: Well nourished, well developed, in no acute distress HEENT: normal Neck: no JVD, carotid bruits, or masses Cardiac: RRR; no murmurs, rubs, or gallops,no edema  Respiratory:  clear to auscultation bilaterally, normal work of breathing GI: soft, nontender, nondistended, + BS MS: no deformity or atrophymild ankle edema, with venous stasis skin changes. Skin: warm and dry, no rash Neuro:  Strength and sensation are intact Psych: euthymic mood, full affect    Recent Labs: 02/15/2015: Magnesium 2.4 03/12/2015: B Natriuretic Peptide 178.0* 03/13/2015: TSH 4.069 03/17/2015: Hemoglobin 9.7*; Platelets 232 03/18/2015: ALT 18 03/20/2015: BUN 19; Creatinine, Ser 1.27*; Potassium 3.8; Sodium 139    Lipid Panel No results found  for: CHOL, TRIG, HDL, CHOLHDL, VLDL, LDLCALC, LDLDIRECT    Wt Readings from Last 3 Encounters:  03/28/15 216 lb (97.977 kg)  03/22/15 221 lb (100.245 kg)  03/20/15 221 lb 14.4 oz (100.653 kg)      Other studies Reviewed: Additional studies/ records that were reviewed today include: None. Review of the above records demonstrates: N/A   ASSESSMENT AND PLAN:  1.  Chronic diastolic heart failure: the patient's weight has been maintained, and she is doing her best with a low sodium diet.  She is occasionally has difficulty of hearing, but her weight remains stable.  She denies any symptoms.  Most recent echocardiogram dated 03/16/2015 demonstrates normal LV systolic function.  The patient will continue on current medication regimen.  I have reinforced with her friend, who does her grocery shopping to read labels and to remove any salty foods from her pantry.  Not to provide her with any fast foods.  Frozen foods or canned foods.  She is to continue daily weights.  She has a home health nurse, who comes by 3 times a week to evaluate her and reinforced this education.  We will see her again in a month with a BMET prior to her office visit for evaluation of kidney status before starting back Pradaxa.   2. Oxygen-dependent COPD.  The patient remained easily dyspneic on exertion.  She has physical therapy visit.  To help with strengthening.  She is asking for helpconcerning her oxygen take monitoring and gages.  I reviewed the gages with her to let her know that the red on the gauge demonstrate low oxygen with the green, stating her tank is full. She needs help changing out the gauges.  I have referred her to the RN, who visits her to assist her with that.   3. Aortic valve replacement with bioprosthetic valve, status post maze operation for atrial fibrillation: she continues on heart rate control with amiodarone, 200 mg daily.  Diltiazem 30 mg daily Q 12 hours and Coumadin. followup discussion I need to  restart Pradaxa.   Current medicines are reviewed at length with the patient today.    Labs/ tests ordered today include: BMET No orders of the defined types were placed in  this encounter.     Disposition:   FU with one month.  Signed, Joni Reining, NP  03/28/2015 3:14 PM    [facility] [address], [place], [place] [address] Phone: [phone]; Fax: [phone]

## 2015-04-07 ENCOUNTER — Encounter: Payer: Self-pay | Admitting: Pulmonary Disease

## 2015-04-07 ENCOUNTER — Ambulatory Visit (INDEPENDENT_AMBULATORY_CARE_PROVIDER_SITE_OTHER)
Admission: RE | Admit: 2015-04-07 | Discharge: 2015-04-07 | Disposition: A | Discharge status: Home / Self Care | Payer: Medicare Other | Source: Ambulatory Visit | Attending: Pulmonary Disease | Admitting: Pulmonary Disease

## 2015-04-07 ENCOUNTER — Ambulatory Visit (INDEPENDENT_AMBULATORY_CARE_PROVIDER_SITE_OTHER): Payer: Medicare Other | Admitting: Pulmonary Disease

## 2015-04-07 VITALS — BP 138/72 | HR 75 | Ht 62.0 in | Wt 218.0 lb

## 2015-04-07 DIAGNOSIS — I509 Heart failure, unspecified: Secondary | ICD-10-CM

## 2015-04-07 DIAGNOSIS — R0602 Shortness of breath: Secondary | ICD-10-CM

## 2015-04-07 DIAGNOSIS — J9601 Acute respiratory failure with hypoxia: Secondary | ICD-10-CM | POA: Diagnosis not present

## 2015-04-07 DIAGNOSIS — J45901 Unspecified asthma with (acute) exacerbation: Secondary | ICD-10-CM | POA: Diagnosis not present

## 2015-04-07 MED ORDER — BUDESONIDE 0.25 MG/2ML IN SUSP: 0.2500 mg | Freq: Two times a day (BID) | RESPIRATORY_TRACT | Status: AC

## 2015-04-07 NOTE — Patient Instructions (Signed)
Start using the Pulmicort twice a day no matter how you feel We will schedule a lung function tested [facility] We will call you with the results of the chest x-ray Use albuterol only as needed We will see you back in 2 months or sooner if needed

## 2015-04-07 NOTE — Addendum Note (Signed)
Addended by: Velvet Bathe on: 04/07/2015 02:53 PM   Modules accepted: Orders

## 2015-04-07 NOTE — Assessment & Plan Note (Signed)
She is still hypoxemic to some degree. I believe this is multifactorial from atelectasis as well as some pulmonary edema.  Plan: Continue management per cardiology Continue using 2 L of oxygen continuously for now

## 2015-04-07 NOTE — Assessment & Plan Note (Addendum)
During her hospitalization after her surgery she had severe respiratory failure requiring multiple intubations and mechanical ventilation. She produces thick copious mucus requiring frequent bronchoscopies for airway clearance. A CT scan at that time showed findings which were worrisome for possible mold exposure. The BAL culture did not grow a fungal organism.  Her wheezing has improved but she continues to have some mucus production. I believe that she still has asthmatic bronchitis which has not been treated because her prednisone was stopped during her recent hospitalization and she's not been taking an inhaled corticosteroid.  Plan: Check a full pulmonary function test Restart budesonide (she has not been taking this) Use albuterol as needed Consider Repeat CT chest

## 2015-04-07 NOTE — Progress Notes (Signed)
Subjective:    Patient ID: Sandra Turner, female    DOB: 05/31/1941, 74 y.o.   MRN: [id number]  Synopsis: 74 year old first seen by [facility] while hospitalized after an aortic valve replacement and maze procedure, she had significant mucus plugging requiring multiple bronchoscopies and courses of mechanical ventilation. Has a history of heavy mold exposure. Also has mild vocal cord paralysis.   HPI Chief Complaint  Patient presents with  . Hospitalization Follow-up    Pt recently tx'ed for pleural effusion in [facility].  Pt does note sob with exertion, prod cough with clear-yellow mucus.     She was hospitalized at [facility] for CHF and was given lasix which helped her breathig. She stopped taking prednisone whlie hospitalized then. She has a morning cough, not much though, clear to yellow mucus.  It is typically thick. She thinks that her breathng is better.   She has not gone back to work since the last hospitalizations.  She is using her nebulizer but only proventil twice a day, not the budesonide. She was never told she had asthma. She never smoked.  She wears the oxygen 24 hours a day, can't tell a difference with it. She has been taking care of her house and walking around a lot.   Past Medical History  Diagnosis Date  . Arthritis   . Chronic atrial fibrillation     a. previously on Pradaxa - on Coumadin since valve surgery.  . Aortic stenosis     a. Severe - s/p pericardial AVR with MAZE 02/04/15.  Marland Kitchen Pneumonia 09/2014  . History of bronchitis 2015  . Joint pain   . Back pain     reason unknown  . Family history of adverse reaction to anesthesia     uncle with MH in the 60's,cousin in the 42's with MH  . 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 COPD (chronic obstructive pulmonary disease)   . Normal coronary arteries     a. By cath 11/2014.  . Pulmonary hypertension     a. Mod by cath 11/2014.  Marland Kitchen Chronic diastolic CHF (congestive heart failure)   . Vocal cord paralysis     a. Dx post-AVR in 01/2015. Required PANDA, intubation during that admission.  . Pleural effusion   . AKI (acute kidney injury)     a. H/o AKI after AVR in 01/2015.  . Tracheomalacia   . Anemia     a. 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      Review of Systems  Constitutional: Negative for fever, chills and fatigue.  HENT: Negative for postnasal drip, rhinorrhea and sinus pressure.   Respiratory: Negative for cough, shortness of breath and wheezing.   Cardiovascular: Negative for chest pain, palpitations and leg swelling.       Objective:   Physical Exam Filed Vitals:   04/07/15 1417  BP: 138/72  Pulse: 75  Height: 5\' 2"  (1.575 m)  Weight: 218 lb (98.884 kg)  SpO2: 91%  RA  Ambulated 200 feet on room air and O2 saturation dropped to 84%  Gen: obese, no distress HENT: OP clear,  neck supple PULM: Diminished bases, crackles left mid lung, otherwise  clear no wheezing, good air movement  CV: RRR, no mgr, trace edema GI: BS+, soft, nontender Derm: no cyanosis or rash Psyche: normal mood and affect   Chest x-ray from July reviewed with what appears to be a small pleural effusion in the left lung, atelectasis bilaterally, cardiomegaly I reviewed Hospital records from her [facility] hospitalization where she was treated for CHF with diuretics.      Assessment & Plan:  Asthmatic bronchitis with acute exacerbation During her hospitalization after her surgery she had severe respiratory failure requiring multiple intubations and mechanical ventilation. She produces thick copious mucus requiring frequent bronchoscopies for airway clearance. A CT  scan at that time showed findings which were worrisome for possible mold exposure. The BAL culture did not grow a fungal organism.  Her wheezing has improved but she continues to have some mucus production. I believe that she still has asthmatic bronchitis which has not been treated because her prednisone was stopped during her recent hospitalization and she's not been taking an inhaled corticosteroid.  Plan: Check a full pulmonary function test Restart budesonide (she has not been taking this) Use albuterol as needed Consider Repeat CT chest  Acute respiratory failure with hypoxia She is still hypoxemic to some degree. I believe this is multifactorial from atelectasis as well as some pulmonary edema.  Plan: Continue management per cardiology Continue using 2 L of oxygen continuously for now     Current outpatient prescriptions:  .  albuterol (PROAIR HFA) 108 (90 BASE) MCG/ACT inhaler, Inhale 2 puffs into the lungs 4 (four) times daily as needed for wheezing or shortness of breath., Disp: , Rfl:  .  albuterol (PROVENTIL) (2.5 MG/3ML) 0.083% nebulizer solution, Take 3 mLs (2.5 mg total) by nebulization every 4 (four) hours as needed for wheezing or shortness of breath., Disp: 75 mL, Rfl: 12 .  amiodarone (PACERONE) 200 MG tablet, Take 1 tablet (200 mg total) by mouth daily., Disp: , Rfl:  .  budesonide (PULMICORT) 0.25 MG/2ML nebulizer solution, Take 2 mLs (0.25 mg total) by nebulization every 6 (six) hours., Disp: 60 mL, Rfl: 12 .  diltiazem (CARDIZEM) 30 MG tablet, Take 1 tablet (30 mg total) by mouth 2 (two) times daily., Disp: 60 tablet, Rfl: 2 .  potassium chloride (K-DUR) 10 MEQ tablet, Take 2 tablets (20 mEq total) by mouth daily., Disp: 60 tablet, Rfl: 6 .  torsemide (DEMADEX) 20 MG tablet, Take 2 tablets (40 mg total) by mouth AC breakfast., Disp: 90 tablet, Rfl: 3 .  torsemide (DEMADEX) 20 MG tablet, Take 1 tablet (20 mg total) by mouth daily after supper., Disp: 30 tablet, Rfl:  0 .  traMADol (ULTRAM) 50 MG tablet, Take 1 tablet (50 mg total) by mouth every 6 (six) hours as needed (pain)., Disp: 50 tablet, Rfl: 0 .  warfarin (COUMADIN) 4 MG tablet, Take 1 tablet (4 mg total) by mouth daily at 6 PM. As directed, bases on INR (Patient taking differently: Take 4 mg by mouth daily at 6 PM. Takes one tablet (  total) Mondays through Fridays. *Takes  on Saturdays and Sundays*), Disp: , Rfl:  .  warfarin (COUMADIN) 5 MG tablet, Take 5 mg by mouth 2 (two) times a week. Takes  on Saturdays and Sundays (*Takes  on all other days), Disp: , Rfl:

## 2015-04-15 ENCOUNTER — Ambulatory Visit (HOSPITAL_COMMUNITY)
Admission: RE | Admit: 2015-04-15 | Discharge: 2015-04-15 | Disposition: A | Discharge status: Home / Self Care | Payer: Medicare Other | Source: Ambulatory Visit | Attending: Pulmonary Disease | Admitting: Pulmonary Disease

## 2015-04-15 DIAGNOSIS — R0602 Shortness of breath: Secondary | ICD-10-CM | POA: Diagnosis present

## 2015-04-15 DIAGNOSIS — J45901 Unspecified asthma with (acute) exacerbation: Secondary | ICD-10-CM

## 2015-04-15 LAB — PULMONARY FUNCTION TEST
DL/VA % pred: 69 %
DL/VA: 3.15 ml/min/mmHg/L
DLCO unc % pred: 40 %
DLCO unc: 8.74 ml/min/mmHg
FEF 25-75 Post: 0.72 L/sec
FEF 25-75 Pre: 0.55 L/sec
FEF2575-%CHANGE-POST: 30 %
FEF2575-%PRED-POST: 43 %
FEF2575-%Pred-Pre: 33 %
FEV1-%CHANGE-POST: 5 %
FEV1-%PRED-POST: 38 %
FEV1-%Pred-Pre: 36 %
FEV1-POST: 0.78 L
FEV1-Pre: 0.74 L
FEV1FVC-%CHANGE-POST: 2 %
FEV1FVC-%Pred-Pre: 101 %
FEV6-%Change-Post: 2 %
FEV6-%PRED-PRE: 38 %
FEV6-%Pred-Post: 39 %
FEV6-Post: 0.99 L
FEV6-Pre: 0.96 L
FEV6FVC-%PRED-PRE: 105 %
FEV6FVC-%Pred-Post: 105 %
FVC-%CHANGE-POST: 2 %
FVC-%Pred-Post: 37 %
FVC-%Pred-Pre: 36 %
FVC-PRE: 0.96 L
FVC-Post: 0.99 L
PRE FEV1/FVC RATIO: 76 %
Post FEV1/FVC ratio: 78 %
Post FEV6/FVC ratio: 100 %
Pre FEV6/FVC Ratio: 100 %
RV % pred: 85 %
RV: 1.84 L
TLC % pred: 61 %
TLC: 2.9 L

## 2015-04-15 MED ORDER — ALBUTEROL SULFATE (2.5 MG/3ML) 0.083% IN NEBU
2.5000 mg | INHALATION_SOLUTION | Freq: Once | RESPIRATORY_TRACT | Status: AC
  Administered 2015-04-15: 2.5 mg via RESPIRATORY_TRACT

## 2015-04-21 ENCOUNTER — Telehealth: Payer: Self-pay | Admitting: Pulmonary Disease

## 2015-04-21 NOTE — Telephone Encounter (Signed)
Spoke with pt. States that her insurance will cover Flovent Diskus or Flovent HFA for cheaper than the Pulmicort. Would like to know which BQ would her to be on.  BQ - please advise. Thanks.

## 2015-04-21 NOTE — Telephone Encounter (Signed)
I called and made pt aware of below. She will do so. Nothing further needed

## 2015-04-21 NOTE — Telephone Encounter (Signed)
Per 04/07/15: Patient Instructions       Start using the Pulmicort twice a day no matter how you feel  --  Called spoke with pt. She reports she pays $40 for the pulmicort and this is too expensive and she wants to stop taking this medication.  Pt reports she normally only pays $3-$4 for medications and can't afford anything more than that.  Please advise Dr. Kendrick Fries thanks

## 2015-04-21 NOTE — Telephone Encounter (Signed)
She really needs to be on an inhaled steroid; she needs to look at her medication formulary to see what is available.  Would look under asthma and then call us so we can pick one for her.

## 2015-04-21 NOTE — Telephone Encounter (Signed)
flovent discus 250 bid

## 2015-04-21 NOTE — Telephone Encounter (Signed)
[id number] x1 

## 2015-04-22 MED ORDER — FLUTICASONE PROPIONATE (INHAL) 250 MCG/BLIST IN AEPB: 1.0000 | INHALATION_SPRAY | Freq: Two times a day (BID) | RESPIRATORY_TRACT | Status: DC

## 2015-04-22 NOTE — Telephone Encounter (Signed)
Rx sent to pharmacy. Patient notified. Nothing further needed.  

## 2015-04-22 NOTE — Telephone Encounter (Signed)
Pt returning call and can be reached @ [phone].Caren Griffins

## 2015-04-28 ENCOUNTER — Encounter: Payer: Self-pay | Admitting: Adult Health

## 2015-04-28 ENCOUNTER — Ambulatory Visit (INDEPENDENT_AMBULATORY_CARE_PROVIDER_SITE_OTHER): Payer: Medicare Other | Admitting: Adult Health

## 2015-04-28 VITALS — BP 134/72 | HR 66 | Ht 62.0 in | Wt 192.0 lb

## 2015-04-28 DIAGNOSIS — I480 Paroxysmal atrial fibrillation: Secondary | ICD-10-CM

## 2015-04-28 DIAGNOSIS — Z79899 Other long term (current) drug therapy: Secondary | ICD-10-CM

## 2015-04-28 DIAGNOSIS — I5022 Chronic systolic (congestive) heart failure: Secondary | ICD-10-CM | POA: Diagnosis not present

## 2015-04-28 MED ORDER — DOCUSATE SODIUM 100 MG PO CAPS: 100.0000 mg | ORAL_CAPSULE | Freq: Every day | ORAL | Status: AC | PRN

## 2015-04-28 MED ORDER — DABIGATRAN ETEXILATE MESYLATE 150 MG PO CAPS: 150.0000 mg | ORAL_CAPSULE | Freq: Two times a day (BID) | ORAL | Status: DC

## 2015-04-28 NOTE — Progress Notes (Signed)
Cardiology Office Note   Date:  04/28/2015   ID:  Sandra Turner, DOB 1940-12-09, MRN [id number]  PCP:  [name], MD  Cardiologist:  [place]/ Joni Reining, NP   Chief Complaint  Patient presents with  . Congestive Heart Failure  . Atrial Fibrillation      History of Present Illness: Sandra Turner is a 74 y.o. female who presents for ongoing assessment and management of chronic diastolic heart failure,aortic valve disease, status post aortic valve replacement, bioprosthetic aortic root enlargement, status post maze operation for atrial fibrillation, with history of chronic dyspnea related to COPD.She had recent lengthy and complicated hospitalization 03/12/2015 for diuresis in the setting of decompensated CHF and also treated for community HCAP. She is diuresis 9.5 liters with discharge weight of 221 pounds.She was last seen on 03/28/2015 and had maintained her wt but was still struggling with salt restriction. She continued on coumadin with CHADS VASC Score of  4. On this follow up will discuss restarting Pradaxa as a month as passed since CVTS visit instructing to wait this long before restarting and to discontinue coumadin. Follow up BMET will be ordered this office visit.   Most recent CXR: 04/07/2015 order by pulmonology:  Mild-to-moderate CHF with interstitial edema and bilateral pleural effusions. There has not been dramatic change since the previous study.  She comes today feeling great. Her weight is down annd additional 16 lbs with increased dose of torsemide to 40 mg in am and 20 mg in pm. She is complaining of her skin peeling with use of amiodarone. She continues to have complaints of raspy voice from tracheomalacia. She is asking to be switched back to Pradaxa as she was taking before coumadin.  She is looking and feeling much better than I have seen her in over 6 months.    Past Medical History  Diagnosis Date  . Arthritis   . Chronic atrial fibrillation     a.  previously on Pradaxa - on Coumadin since valve surgery.  . Aortic stenosis     a. Severe - s/p pericardial AVR with MAZE 02/04/15.  Marland Kitchen Pneumonia 09/2014  . History of bronchitis 2015  . Joint pain   . Back pain     reason unknown  . Family history of adverse reaction to anesthesia     uncle with MH in the 60's,cousin in the 31's with MH  . 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 COPD (chronic obstructive pulmonary disease)   . Normal coronary arteries     a. By cath 11/2014.  . Pulmonary hypertension     a. Mod by cath 11/2014.  Marland Kitchen Chronic diastolic CHF (congestive heart failure)   . Vocal cord paralysis     a. Dx post-AVR in 01/2015. Required PANDA, intubation during that admission.  . Pleural effusion   . AKI (acute kidney injury)     a. H/o AKI after AVR in 01/2015.  . Tracheomalacia   . Anemia     a. 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    Past Surgical History  Procedure Laterality Date  . None    . Left and right heart catheterization with coronary angiogram N/A 11/01/2014  Procedure: LEFT AND RIGHT HEART CATHETERIZATION WITH CORONARY ANGIOGRAM;  Surgeon: [name], MD;  Location: [facility] CATH LAB;  Service: Cardiovascular;  Laterality: N/A;  . Aortic valve replacement N/A 01/19/2015    Procedure: AORTIC VALVE REPLACEMENT (AVR);  Surgeon: Purcell Nails, MD;  Location: [facility] OR;  Service: Open Heart Surgery;  Laterality: N/A;  . Maze N/A 01/19/2015    Procedure: MAZE;  Surgeon: Purcell Nails, MD;  Location: [facility] OR;  Service: Open Heart Surgery;  Laterality: N/A;  . Aortic root enlargement N/A 01/19/2015    Procedure:  AORTIC ROOT ENLARGEMENT;   Surgeon: Purcell Nails, MD;  Location: [facility] OR;  Service: Open Heart Surgery;  Laterality: N/A;  . Tee without cardioversion N/A 01/19/2015    Procedure: TRANSESOPHAGEAL ECHOCARDIOGRAM (TEE);  Surgeon: Purcell Nails, MD;  Location: [facility] OR;  Service: Open Heart Surgery;  Laterality: N/A;     Current Outpatient Prescriptions  Medication Sig Dispense Refill  . albuterol (PROAIR HFA) 108 (90 BASE) MCG/ACT inhaler Inhale 2 puffs into the lungs 4 (four) times daily as needed for wheezing or shortness of breath.    Marland Kitchen albuterol (PROVENTIL) (2.5 MG/3ML) 0.083% nebulizer solution Take 3 mLs (2.5 mg total) by nebulization every 4 (four) hours as needed for wheezing or shortness of breath. 75 mL 12  . budesonide (PULMICORT) 0.25 MG/2ML nebulizer solution Take 2 mLs (0.25 mg total) by nebulization 2 (two) times daily. 60 mL 11  . diltiazem (CARDIZEM) 30 MG tablet Take 1 tablet (30 mg total) by mouth 2 (two) times daily. 60 tablet 2  . Fluticasone Propionate, Inhal, (FLOVENT DISKUS) 250 MCG/BLIST AEPB Inhale 1 puff into the lungs 2 (two) times daily. 60 each 3  . potassium chloride (K-DUR) 10 MEQ tablet Take 2 tablets (20 mEq total) by mouth daily. 60 tablet 6  . torsemide (DEMADEX) 20 MG tablet Take 2 tablets (40 mg total) by mouth AC breakfast. 90 tablet 3  . torsemide (DEMADEX) 20 MG tablet Take 1 tablet (20 mg total) by mouth daily after supper. 30 tablet 0  . traMADol (ULTRAM) 50 MG tablet Take 1 tablet (50 mg total) by mouth every 6 (six) hours as needed (pain). 50 tablet 0  . dabigatran (PRADAXA) 150 MG CAPS capsule Take 1 capsule (150 mg total) by mouth 2 (two) times daily. 60 capsule 6  . docusate sodium (COLACE) 100 MG capsule Take 1 capsule (100 mg total) by mouth daily as needed for mild constipation. 30 capsule 1   No current facility-administered medications for this visit.    Allergies:   Bee venom; Latex; and Penicillins    Social History:  The patient  reports that she has never smoked.  She has never used smokeless tobacco. She reports that she does not drink alcohol or use illicit drugs.   Family History:  The patient's family history includes Breast cancer in her mother; Cancer in her father; Congestive Heart Failure in her mother; Malignant hyperthermia in her cousin.    ROS: .   All other systems are reviewed and negative.Unless otherwise mentioned in H&P above.   PHYSICAL EXAM: VS:  BP 134/72 mmHg  Pulse 66  Ht 5\' 2"  (1.575 m)  Wt 192 lb (87.091 kg)  BMI 35.11 kg/m2  SpO2 91% , BMI Body mass index is 35.11 kg/(m^2). GEN: Well nourished, well developed, in no acute distress HEENT: normal Neck: no JVD, carotid bruits, or masses Cardiac: RRR; no murmurs, rubs, or gallops,no edema  Respiratory:  clear to auscultation bilaterally, normal work of breathing GI: soft, nontender, nondistended, + BS MS: no deformity or atrophy Skin: warm and dry, no rash Neuro:  Strength and sensation are intact Psych: euthymic mood, full affect   Recent Labs: 02/15/2015: Magnesium 2.4 03/12/2015: B Natriuretic Peptide 178.0* 03/13/2015: TSH 4.069 03/17/2015: Hemoglobin 9.7*; Platelets 232 03/18/2015: ALT 18 03/20/2015: BUN 19; Creatinine, Ser 1.27*; Potassium 3.8; Sodium 139    Lipid Panel No results found for: CHOL, TRIG, HDL, CHOLHDL, VLDL, LDLCALC, LDLDIRECT    Wt Readings from Last 3 Encounters:  04/28/15 192 lb (87.091 kg)  04/07/15 218 lb (98.884 kg)  03/28/15 216 lb (97.977 kg)    ASSESSMENT AND PLAN:  1.  Atrial fib: I will check a INR. I have talked to pharmacist in the coumadin clinic who agrees that restarting Pradaxa when INR is <2.0 is ok. She also wants to stop the amiodarone as this is having the side effect of peeling skin since starting it. I will take her off of it but will repeat the EKG in 2 weeks when the amiodarone is 'washed out." I am concerned that she will go back into afib off of this medication. May need to increase diltiazem in a couple of weeks. Will  see her in one month.   2.CHF: I will decrease the torsemide to 20 mg BID. She is to continue to weight daily and if she begins to gain fluid wt, she will go back on the higher dose of 40 mg in am and 20 mg in pm. I will check a BMET today.   3. Tracheomalacia: Continues voice difficulty with raspy sound. She will follow up with pulmonology and PCP for this.   Current medicines are reviewed at length with the patient today.    Labs/ tests ordered today include: INR and BMET.  Orders Placed This Encounter  Procedures  . INR/PT  . Basic Metabolic Panel (BMET)     Disposition:   FU with 2 weeks for EKG and one month office visit.  Signed, Joni Reining, NP  04/28/2015 4:40 PM    [facility] [address] [place], [place] [address] Phone: [phone]; Fax: [phone]

## 2015-04-28 NOTE — Patient Instructions (Addendum)
Your physician recommends that you schedule a follow-up appointment in: 1 month  Your physician recommends that you schedule a follow-up appointment in 2 weeks for an EKG.   Your physician has recommended you make the following change in your medication:   Start Taking Pradaxa 150 mg Two times Daily  Start Taking Colace 100 mg Daily  Your physician recommends that you return for lab work Today  Thank you for choosing [facility]!

## 2015-04-28 NOTE — Progress Notes (Signed)
Name: Sandra Turner    DOB: Aug 14, 1941  Age: 74 y.o.  MR#: [id number]       PCP:  [name], MD      Insurance: Payor: [employer] MEDICARE / Plan: [place] MEDICARE / Product Type: *No Product type* /   CC:   No chief complaint on file.   VS Filed Vitals:   04/28/15 1458  BP: 134/72  Pulse: 66  Height:  (1.575 m)  Weight: 192 lb (87.091 kg)  SpO2: 91%    Weights Current Weight  04/28/15 192 lb (87.091 kg)  04/07/15 218 lb (98.884 kg)  03/28/15 216 lb (97.977 kg)    Blood Pressure  BP Readings from Last 3 Encounters:  04/28/15 134/72  04/07/15 138/72  03/28/15 122/60     Admit date:  (Not on file) Last encounter with RMR:  03/28/2015   Allergy Bee venom; Latex; and Penicillins  Current Outpatient Prescriptions  Medication Sig Dispense Refill  . albuterol (PROAIR HFA) 108 (90 BASE) MCG/ACT inhaler Inhale 2 puffs into the lungs 4 (four) times daily as needed for wheezing or shortness of breath.    Marland Kitchen albuterol (PROVENTIL) (2.5 MG/3ML) 0.083% nebulizer solution Take 3 mLs (2.5 mg total) by nebulization every 4 (four) hours as needed for wheezing or shortness of breath. 75 mL 12  . amiodarone (PACERONE) 200 MG tablet Take 1 tablet (200 mg total) by mouth daily.    . budesonide (PULMICORT) 0.25 MG/2ML nebulizer solution Take 2 mLs (0.25 mg total) by nebulization 2 (two) times daily. 60 mL 11  . diltiazem (CARDIZEM) 30 MG tablet Take 1 tablet (30 mg total) by mouth 2 (two) times daily. 60 tablet 2  . Fluticasone Propionate, Inhal, (FLOVENT DISKUS) 250 MCG/BLIST AEPB Inhale 1 puff into the lungs 2 (two) times daily. 60 each 3  . potassium chloride (K-DUR) 10 MEQ tablet Take 2 tablets (20 mEq total) by mouth daily. 60 tablet 6  . torsemide (DEMADEX) 20 MG tablet Take 2 tablets (40 mg total) by mouth AC breakfast. 90 tablet 3  . torsemide (DEMADEX) 20 MG tablet Take 1 tablet (20 mg total) by mouth daily after supper. 30 tablet 0  . traMADol (ULTRAM) 50 MG tablet Take 1 tablet  (50 mg total) by mouth every 6 (six) hours as needed (pain). 50 tablet 0  . warfarin (COUMADIN) 4 MG tablet Take 1 tablet (4 mg total) by mouth daily at 6 PM. As directed, bases on INR (Patient taking differently: Take 4 mg by mouth daily at 6 PM. Takes one tablet (  total) Mondays through Fridays. *Takes  on Saturdays and Sundays*)    . warfarin (COUMADIN) 5 MG tablet Take 5 mg by mouth 2 (two) times a week. Takes  on Saturdays and Sundays (*Takes  on all other days)     No current facility-administered medications for this visit.    Discontinued Meds:   There are no discontinued medications.  Patient Active Problem List   Diagnosis Date Noted  . Congestive heart disease   . Acute on chronic diastolic heart failure 03/14/2015  . CHF (congestive heart failure) 03/12/2015  . Anemia 03/12/2015  . ARF (acute renal failure) 03/12/2015  . Dyspnea 03/12/2015  . 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 Pressure ulcer 02/01/2015  . Asthmatic bronchitis with acute exacerbation 01/31/2015  . Mucus plugging of bronchi 01/31/2015  . Atelectasis   . Shortness of breath   . Pleural effusion   . 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 S/P Maze operation for atrial fibrillation 01/19/2015  . Severe aortic stenosis 11/22/2014  . Acute respiratory failure with hypoxia 09/11/2014  . Chronic bronchitis with acute exacerbation 09/11/2014  . Family history of malignant hyperthermia 04/26/2014  . Critical aortic valve stenosis 08/25/2012  . Pulmonary hypertension 08/25/2012  . Atrial fibrillation 08/22/2012  . Obesity 08/22/2012    LABS    Component Value Date/Time   NA 139 03/20/2015 0623   NA 139 03/19/2015 0645   NA 140 03/18/2015 0703   K 3.8 03/20/2015 0623   K 3.5 03/19/2015 0645   K 3.6 03/18/2015 0703   CL 94* 03/20/2015 0623   CL 94* 03/19/2015 0645   CL 97* 03/18/2015 0703   CO2 37* 03/20/2015 0623   CO2 35* 03/19/2015 0645    CO2 35* 03/18/2015 0703   GLUCOSE 86 03/20/2015 0623   GLUCOSE 87 03/19/2015 0645   GLUCOSE 86 03/18/2015 0703   BUN 19 03/20/2015 0623   BUN 19 03/19/2015 0645   BUN 19 03/18/2015 0703   CREATININE 1.27* 03/20/2015 0623   CREATININE 1.21* 03/19/2015 0645   CREATININE 1.23* 03/18/2015 0703   CALCIUM 8.7* 03/20/2015 0623   CALCIUM 8.6* 03/19/2015 0645   CALCIUM 8.5* 03/18/2015 0703   GFRNONAA 41* 03/20/2015 0623   GFRNONAA 44* 03/19/2015 0645   GFRNONAA 43* 03/18/2015 0703   GFRAA 48* 03/20/2015 0623   GFRAA 51* 03/19/2015 0645   GFRAA 50* 03/18/2015 0703   CMP     Component Value Date/Time   NA 139 03/20/2015 0623   K 3.8 03/20/2015 0623   CL 94* 03/20/2015 0623   CO2 37* 03/20/2015 0623   GLUCOSE 86 03/20/2015 0623   BUN 19 03/20/2015 0623   CREATININE 1.27* 03/20/2015 0623   CALCIUM 8.7* 03/20/2015 0623   PROT 6.3* 03/18/2015 0703   ALBUMIN 2.8* 03/18/2015 0703   AST 20 03/18/2015 0703   ALT 18 03/18/2015 0703   ALKPHOS 95 03/18/2015 0703   BILITOT 0.8 03/18/2015 0703   GFRNONAA 41* 03/20/2015 0623   GFRAA 48* 03/20/2015 0623       Component Value Date/Time   WBC 6.1 03/17/2015 0643   WBC 6.4 03/16/2015 0550   WBC 5.9 03/15/2015 0652   HGB 9.7* 03/17/2015 0643   HGB 9.2* 03/16/2015 0550   HGB 9.3* 03/15/2015 0652   HCT 31.1* 03/17/2015 0643   HCT 29.8* 03/16/2015 0550   HCT 30.0* 03/15/2015 0652   MCV 95.4 03/17/2015 0643   MCV 95.2 03/16/2015 0550   MCV 94.3 03/15/2015 0652    Lipid Panel  No results found for: CHOL, TRIG, HDL, CHOLHDL, VLDL, LDLCALC, LDLDIRECT  ABG    Component Value Date/Time   PHART 7.507* 02/05/2015 0442   PCO2ART 50.1* 02/05/2015 0442   PO2ART 85.0 02/05/2015 0442   HCO3 39.8* 02/05/2015 0442   TCO2 41 02/05/2015 0442   ACIDBASEDEF TEST WILL BE CREDITED 01/20/2015 0930   O2SAT 97.0 02/05/2015 0442     Lab Results  Component Value Date   TSH 4.069 03/13/2015   BNP (last 3 results)  Recent Labs  02/07/15 0520  02/12/15 0435 03/12/15 2156  BNP 204.8* 271.9* 178.0*    ProBNP (last 3 results) No results for input(s): PROBNP in the last 8760 hours.  Cardiac Panel (last 3 results) No results for input(s): CKTOTAL, CKMB, TROPONINI, RELINDX in the last 72 hours.  Iron/TIBC/Ferritin/ %Sat    Component Value Date/Time   IRON 34 03/13/2015 0615  TIBC 279 03/13/2015 0615   FERRITIN 195 03/13/2015 0615   IRONPCTSAT 12 03/13/2015 0615     EKG Orders placed or performed during the hospital encounter of 03/12/15  . EKG 12-Lead  . EKG 12-Lead  . EKG 12-Lead  . EKG 12-Lead  . EKG 12-Lead  . EKG 12-Lead     Prior Assessment and Plan Problem List as of 04/28/2015      Cardiovascular and Mediastinum   Atrial fibrillation   Last Assessment & Plan 08/16/2014 Office Visit Written 08/16/2014  1:03 PM by [name], MD    She continues on Pradaxa without reported bleeding problems.      Critical aortic valve stenosis   Last Assessment & Plan 08/16/2014 Office Visit Written 08/16/2014  1:02 PM by [name], MD    Severe calcific aortic stenosis. Patient has associated lung disease with moderate ventilatory defect and airway obstruction, severe pulmonary hypertension, atrial fibrillation, and also reported family history of malignant hyperthermia resulting in 2 deaths with surgery. She is symptomatically stable at this point. Follow-up echocardiogram will be obtained, and she should keep her follow-up with Dr. [name] in the multidisciplinary valve clinic.       Pulmonary hypertension   Severe aortic stenosis   CHF (congestive heart failure)   Acute on chronic diastolic heart failure   Congestive heart disease     Respiratory   Acute respiratory failure with hypoxia   Last Assessment & Plan 04/07/2015 Office Visit Written 04/07/2015  2:47 PM by Lupita Leash, MD    She is still hypoxemic to some degree. I believe this is multifactorial from atelectasis as well as some pulmonary  edema.  Plan: Continue management per cardiology Continue using 2 L of oxygen continuously for now      Chronic bronchitis with acute exacerbation   Atelectasis   Pleural effusion   Asthmatic bronchitis with acute exacerbation   Last Assessment & Plan 04/07/2015 Office Visit Edited 04/07/2015  2:49 PM by Lupita Leash, MD    During her hospitalization after her surgery she had severe respiratory failure requiring multiple intubations and mechanical ventilation. She produces thick copious mucus requiring frequent bronchoscopies for airway clearance. A CT scan at that time showed findings which were worrisome for possible mold exposure. The BAL culture did not grow a fungal organism.  Her wheezing has improved but she continues to have some mucus production. I believe that she still has asthmatic bronchitis which has not been treated because her prednisone was stopped during her recent hospitalization and she's not been taking an inhaled corticosteroid.  Plan: Check a full pulmonary function test Restart budesonide (she has not been taking this) Use albuterol as needed Consider Repeat CT chest      Mucus plugging of bronchi   Tracheomalacia     Musculoskeletal and Integument   Pressure ulcer     Genitourinary   ARF (acute renal failure)     Other   Obesity   Family history of malignant hyperthermia   Last Assessment & Plan 04/26/2014 Office Visit Written 04/26/2014  2:42 PM by [name], MD    I continue to recommend that she get screened and have provided her the information to make contact with a center that performs this testing. Frankly, I am not certain whether our anesthesia group can help with this or not. Clearly, this will need to be investigated before she is considered for any type of open aortic valve repair.  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 The pulmonary interstitial markings remain increased. Confluent density peripherally in the left mid lung is stable and likely reflects atelectasis or scarring. The cardiac silhouette remains enlarged. A left atrial appendage clip and an aortic valve ring are visible. The pulmonary vascularity remains engorged. There are 8 intact sternal wires.  IMPRESSION: Mild-to-moderate CHF with interstitial edema and bilateral pleural effusions. There has not been dramatic change since the previous study.   Electronically Signed   By: David  [place] M.D.   On: 04/07/2015 17:24

## 2015-04-29 ENCOUNTER — Other Ambulatory Visit: Payer: Self-pay | Admitting: Adult Health

## 2015-04-29 DIAGNOSIS — Z79899 Other long term (current) drug therapy: Secondary | ICD-10-CM

## 2015-04-30 LAB — BASIC METABOLIC PANEL
BUN: 28 mg/dL — ABNORMAL HIGH (ref 7–25)
CHLORIDE: 91 mmol/L — AB (ref 98–110)
CO2: 31 mmol/L (ref 20–31)
Calcium: 9.3 mg/dL (ref 8.6–10.4)
Creat: 1.84 mg/dL — ABNORMAL HIGH (ref 0.60–0.93)
GLUCOSE: 87 mg/dL (ref 65–99)
POTASSIUM: 4.2 mmol/L (ref 3.5–5.3)
SODIUM: 136 mmol/L (ref 135–146)

## 2015-04-30 LAB — PROTIME-INR
INR: 2.62 — ABNORMAL HIGH (ref <1.50)
PROTHROMBIN TIME: 28 s — AB (ref 11.6–15.2)

## 2015-05-02 NOTE — Addendum Note (Signed)
Addended by: Abelino Derrick R on: 05/02/2015 09:06 AM   Modules accepted: Orders

## 2015-05-03 ENCOUNTER — Telehealth: Payer: Self-pay | Admitting: Adult Health

## 2015-05-03 DIAGNOSIS — Z79899 Other long term (current) drug therapy: Secondary | ICD-10-CM

## 2015-05-03 NOTE — Telephone Encounter (Signed)
Would like to know her lab results.

## 2015-05-03 NOTE — Telephone Encounter (Signed)
Patient called to see if she can go back on Pradaxa . Please advise

## 2015-05-05 NOTE — Telephone Encounter (Signed)
Spoke with pt. Instructions given. Patient voiced understanding.

## 2015-05-05 NOTE — Telephone Encounter (Signed)
Last INR was 2.3. She has to wait until her INR is <2.0 to start Pradaxa. Please have her seen by Misty Stanley to recheck this and restart Pradaxa. She is to hold the coumadin for one day before seeing Misty Stanley. Repeat her BMET. She is elevated in creatinine level. She was to decrease torsemide to 20 mg BID instead of 40 mg in am and 20 mg in pm. Make sure she did this.   Thanks!

## 2015-05-07 LAB — BASIC METABOLIC PANEL
BUN: 20 mg/dL (ref 7–25)
CALCIUM: 9.2 mg/dL (ref 8.6–10.4)
CO2: 31 mmol/L (ref 20–31)
Chloride: 96 mmol/L — ABNORMAL LOW (ref 98–110)
Creat: 1.58 mg/dL — ABNORMAL HIGH (ref 0.60–0.93)
Glucose, Bld: 85 mg/dL (ref 65–99)
Potassium: 4.3 mmol/L (ref 3.5–5.3)
Sodium: 138 mmol/L (ref 135–146)

## 2015-05-16 ENCOUNTER — Ambulatory Visit (INDEPENDENT_AMBULATORY_CARE_PROVIDER_SITE_OTHER): Payer: Medicare Other | Admitting: *Deleted

## 2015-05-16 ENCOUNTER — Ambulatory Visit (INDEPENDENT_AMBULATORY_CARE_PROVIDER_SITE_OTHER): Payer: Medicare Other

## 2015-05-16 DIAGNOSIS — I4891 Unspecified atrial fibrillation: Secondary | ICD-10-CM

## 2015-05-16 NOTE — Progress Notes (Signed)
Pt here to be transitioned from coumadin to pradaxa.  She has not had coumadin in 2 days.  INR 3.2 today.  Will hold coumadin 2 more days.  She will come back for INR check on 9/14 and start Pradaxa then if WNL.

## 2015-05-18 ENCOUNTER — Ambulatory Visit (INDEPENDENT_AMBULATORY_CARE_PROVIDER_SITE_OTHER): Payer: Medicare Other | Admitting: *Deleted

## 2015-05-18 ENCOUNTER — Telehealth: Payer: Self-pay | Admitting: *Deleted

## 2015-05-18 DIAGNOSIS — I4891 Unspecified atrial fibrillation: Secondary | ICD-10-CM

## 2015-05-18 NOTE — Telephone Encounter (Signed)
Patient informed and verbalized understanding of plan. 

## 2015-05-18 NOTE — Progress Notes (Signed)
Pt back for INR check to be transitioned from coumadin to Pradaxa.  Pt has not had any coumadin in 4 days.  INR 2 days ago was 3.2   INR today is 2.5  She will hold coumadin 2 more days then start Pradaxa  bid on Friday 05/20/15.  She will follow up with me in 1 month for repeat CBC and BMP.  Appt made.

## 2015-05-18 NOTE — Telephone Encounter (Signed)
-----   Message from Jodelle Gross, NP sent at 05/16/2015  4:37 PM EDT ----- Continues in atrial fib but rate is controlled.Continue Pradaxa and diltiazem. Low sodium diet.

## 2015-05-22 ENCOUNTER — Encounter (HOSPITAL_COMMUNITY): Payer: Self-pay | Admitting: Emergency Medicine

## 2015-05-22 ENCOUNTER — Emergency Department (HOSPITAL_COMMUNITY): Payer: Medicare Other

## 2015-05-22 ENCOUNTER — Inpatient Hospital Stay (HOSPITAL_COMMUNITY)
Admission: EM | Admit: 2015-05-22 | Discharge: 2015-05-30 | DRG: 291 | Disposition: A | Discharge status: Home Health | Payer: Medicare Other | Attending: Internal Medicine | Admitting: Internal Medicine

## 2015-05-22 DIAGNOSIS — R600 Localized edema: Secondary | ICD-10-CM | POA: Diagnosis not present

## 2015-05-22 DIAGNOSIS — I50810 Right heart failure, unspecified: Secondary | ICD-10-CM

## 2015-05-22 DIAGNOSIS — R609 Edema, unspecified: Secondary | ICD-10-CM

## 2015-05-22 DIAGNOSIS — Y95 Nosocomial condition: Secondary | ICD-10-CM | POA: Diagnosis present

## 2015-05-22 DIAGNOSIS — J9611 Chronic respiratory failure with hypoxia: Secondary | ICD-10-CM | POA: Diagnosis present

## 2015-05-22 DIAGNOSIS — J181 Lobar pneumonia, unspecified organism: Secondary | ICD-10-CM | POA: Diagnosis not present

## 2015-05-22 DIAGNOSIS — I5033 Acute on chronic diastolic (congestive) heart failure: Secondary | ICD-10-CM | POA: Diagnosis present

## 2015-05-22 DIAGNOSIS — R040 Epistaxis: Secondary | ICD-10-CM | POA: Diagnosis present

## 2015-05-22 DIAGNOSIS — R0602 Shortness of breath: Secondary | ICD-10-CM | POA: Diagnosis not present

## 2015-05-22 DIAGNOSIS — J9612 Chronic respiratory failure with hypercapnia: Secondary | ICD-10-CM

## 2015-05-22 DIAGNOSIS — I509 Heart failure, unspecified: Secondary | ICD-10-CM

## 2015-05-22 DIAGNOSIS — L988 Other specified disorders of the skin and subcutaneous tissue: Secondary | ICD-10-CM | POA: Diagnosis present

## 2015-05-22 DIAGNOSIS — Z8249 Family history of ischemic heart disease and other diseases of the circulatory system: Secondary | ICD-10-CM

## 2015-05-22 DIAGNOSIS — I5043 Acute on chronic combined systolic (congestive) and diastolic (congestive) heart failure: Principal | ICD-10-CM

## 2015-05-22 DIAGNOSIS — Z7901 Long term (current) use of anticoagulants: Secondary | ICD-10-CM

## 2015-05-22 DIAGNOSIS — R0603 Acute respiratory distress: Secondary | ICD-10-CM

## 2015-05-22 DIAGNOSIS — Z9981 Dependence on supplemental oxygen: Secondary | ICD-10-CM

## 2015-05-22 DIAGNOSIS — E876 Hypokalemia: Secondary | ICD-10-CM | POA: Diagnosis present

## 2015-05-22 DIAGNOSIS — N184 Chronic kidney disease, stage 4 (severe): Secondary | ICD-10-CM | POA: Diagnosis present

## 2015-05-22 DIAGNOSIS — E669 Obesity, unspecified: Secondary | ICD-10-CM | POA: Diagnosis present

## 2015-05-22 DIAGNOSIS — J9621 Acute and chronic respiratory failure with hypoxia: Secondary | ICD-10-CM

## 2015-05-22 DIAGNOSIS — Z6835 Body mass index (BMI) 35.0-35.9, adult: Secondary | ICD-10-CM

## 2015-05-22 DIAGNOSIS — Z953 Presence of xenogenic heart valve: Secondary | ICD-10-CM

## 2015-05-22 DIAGNOSIS — I482 Chronic atrial fibrillation: Secondary | ICD-10-CM | POA: Diagnosis present

## 2015-05-22 DIAGNOSIS — J449 Chronic obstructive pulmonary disease, unspecified: Secondary | ICD-10-CM | POA: Diagnosis present

## 2015-05-22 DIAGNOSIS — Z952 Presence of prosthetic heart valve: Secondary | ICD-10-CM

## 2015-05-22 DIAGNOSIS — N183 Chronic kidney disease, stage 3 (moderate): Secondary | ICD-10-CM | POA: Diagnosis present

## 2015-05-22 DIAGNOSIS — N179 Acute kidney failure, unspecified: Secondary | ICD-10-CM | POA: Diagnosis present

## 2015-05-22 DIAGNOSIS — J45901 Unspecified asthma with (acute) exacerbation: Secondary | ICD-10-CM | POA: Diagnosis present

## 2015-05-22 DIAGNOSIS — K148 Other diseases of tongue: Secondary | ICD-10-CM | POA: Diagnosis present

## 2015-05-22 DIAGNOSIS — Z807 Family history of other malignant neoplasms of lymphoid, hematopoietic and related tissues: Secondary | ICD-10-CM

## 2015-05-22 DIAGNOSIS — M1990 Unspecified osteoarthritis, unspecified site: Secondary | ICD-10-CM | POA: Diagnosis present

## 2015-05-22 DIAGNOSIS — D649 Anemia, unspecified: Secondary | ICD-10-CM | POA: Diagnosis present

## 2015-05-22 DIAGNOSIS — Z803 Family history of malignant neoplasm of breast: Secondary | ICD-10-CM

## 2015-05-22 DIAGNOSIS — I272 Other secondary pulmonary hypertension: Secondary | ICD-10-CM | POA: Diagnosis present

## 2015-05-22 DIAGNOSIS — Z66 Do not resuscitate: Secondary | ICD-10-CM | POA: Diagnosis present

## 2015-05-22 DIAGNOSIS — I350 Nonrheumatic aortic (valve) stenosis: Secondary | ICD-10-CM | POA: Diagnosis present

## 2015-05-22 DIAGNOSIS — R739 Hyperglycemia, unspecified: Secondary | ICD-10-CM | POA: Diagnosis not present

## 2015-05-22 DIAGNOSIS — I4891 Unspecified atrial fibrillation: Secondary | ICD-10-CM | POA: Diagnosis present

## 2015-05-22 LAB — COMPREHENSIVE METABOLIC PANEL
ALT: 14 U/L (ref 14–54)
AST: 27 U/L (ref 15–41)
Albumin: 3.6 g/dL (ref 3.5–5.0)
Alkaline Phosphatase: 92 U/L (ref 38–126)
Anion gap: 8 (ref 5–15)
BILIRUBIN TOTAL: 0.7 mg/dL (ref 0.3–1.2)
BUN: 23 mg/dL — AB (ref 6–20)
CHLORIDE: 100 mmol/L — AB (ref 101–111)
CO2: 29 mmol/L (ref 22–32)
CREATININE: 1.63 mg/dL — AB (ref 0.44–1.00)
Calcium: 8.3 mg/dL — ABNORMAL LOW (ref 8.9–10.3)
GFR, EST AFRICAN AMERICAN: 35 mL/min — AB (ref >60)
GFR, EST NON AFRICAN AMERICAN: 30 mL/min — AB (ref >60)
Glucose, Bld: 129 mg/dL — ABNORMAL HIGH (ref 65–99)
Potassium: 4.5 mmol/L (ref 3.5–5.1)
Sodium: 137 mmol/L (ref 135–145)
TOTAL PROTEIN: 7.3 g/dL (ref 6.5–8.1)

## 2015-05-22 LAB — CBC WITH DIFFERENTIAL/PLATELET
BASOS PCT: 0 %
Basophils Absolute: 0 10*3/uL (ref 0.0–0.1)
EOS ABS: 0.2 10*3/uL (ref 0.0–0.7)
Eosinophils Relative: 2 %
HEMATOCRIT: 27.4 % — AB (ref 36.0–46.0)
Hemoglobin: 8.7 g/dL — ABNORMAL LOW (ref 12.0–15.0)
LYMPHS ABS: 0.6 10*3/uL — AB (ref 0.7–4.0)
Lymphocytes Relative: 9 %
MCH: 28.7 pg (ref 26.0–34.0)
MCHC: 31.8 g/dL (ref 30.0–36.0)
MCV: 90.4 fL (ref 78.0–100.0)
MONO ABS: 0.6 10*3/uL (ref 0.1–1.0)
MONOS PCT: 9 %
Neutro Abs: 5.4 10*3/uL (ref 1.7–7.7)
Neutrophils Relative %: 80 %
Platelets: 155 10*3/uL (ref 150–400)
RBC: 3.03 MIL/uL — ABNORMAL LOW (ref 3.87–5.11)
RDW: 16.1 % — AB (ref 11.5–15.5)
WBC: 6.7 10*3/uL (ref 4.0–10.5)

## 2015-05-22 LAB — PROTIME-INR
INR: 5.23 — AB (ref 0.00–1.49)
PROTHROMBIN TIME: 46.5 s — AB (ref 11.6–15.2)

## 2015-05-22 MED ORDER — BUDESONIDE 0.25 MG/2ML IN SUSP
0.2500 mg | Freq: Two times a day (BID) | RESPIRATORY_TRACT | Status: DC
  Administered 2015-05-22 – 2015-05-30 (×15): 0.25 mg via RESPIRATORY_TRACT
  Filled 2015-05-22 (×20): qty 2

## 2015-05-22 MED ORDER — DILTIAZEM HCL 30 MG PO TABS
30.0000 mg | ORAL_TABLET | Freq: Two times a day (BID) | ORAL | Status: DC
  Administered 2015-05-23 – 2015-05-24 (×5): 30 mg via ORAL
  Filled 2015-05-22 (×5): qty 1

## 2015-05-22 MED ORDER — PREDNISONE 20 MG PO TABS
40.0000 mg | ORAL_TABLET | Freq: Every day | ORAL | Status: AC
  Administered 2015-05-23: 40 mg via ORAL
  Filled 2015-05-22: qty 2

## 2015-05-22 MED ORDER — ALBUTEROL SULFATE (2.5 MG/3ML) 0.083% IN NEBU
5.0000 mg | INHALATION_SOLUTION | Freq: Once | RESPIRATORY_TRACT | Status: AC
  Administered 2015-05-22: 5 mg via RESPIRATORY_TRACT
  Filled 2015-05-22: qty 6

## 2015-05-22 MED ORDER — FUROSEMIDE 10 MG/ML IJ SOLN
40.0000 mg | Freq: Two times a day (BID) | INTRAMUSCULAR | Status: DC
  Administered 2015-05-23: 40 mg via INTRAVENOUS
  Filled 2015-05-22: qty 4

## 2015-05-22 MED ORDER — FUROSEMIDE 10 MG/ML IJ SOLN
60.0000 mg | Freq: Once | INTRAMUSCULAR | Status: AC
  Administered 2015-05-22: 60 mg via INTRAVENOUS
  Filled 2015-05-22: qty 6

## 2015-05-22 MED ORDER — POTASSIUM CHLORIDE CRYS ER 10 MEQ PO TBCR
20.0000 meq | EXTENDED_RELEASE_TABLET | Freq: Every day | ORAL | Status: DC
  Administered 2015-05-23 – 2015-05-30 (×7): 20 meq via ORAL
  Filled 2015-05-22 (×9): qty 2

## 2015-05-22 MED ORDER — MAGNESIUM SULFATE 2 GM/50ML IV SOLN
2.0000 g | Freq: Once | INTRAVENOUS | Status: AC
  Administered 2015-05-23: 2 g via INTRAVENOUS
  Filled 2015-05-22: qty 50

## 2015-05-22 MED ORDER — MAGNESIUM OXIDE 400 (241.3 MG) MG PO TABS
400.0000 mg | ORAL_TABLET | Freq: Two times a day (BID) | ORAL | Status: DC
  Administered 2015-05-23 – 2015-05-29 (×8): 400 mg via ORAL
  Filled 2015-05-22 (×14): qty 1

## 2015-05-22 MED ORDER — MONTELUKAST SODIUM 10 MG PO TABS
10.0000 mg | ORAL_TABLET | Freq: Every day | ORAL | Status: DC
  Administered 2015-05-23 – 2015-05-29 (×8): 10 mg via ORAL
  Filled 2015-05-22 (×8): qty 1

## 2015-05-22 MED ORDER — LEVALBUTEROL HCL 1.25 MG/0.5ML IN NEBU
1.2500 mg | INHALATION_SOLUTION | Freq: Three times a day (TID) | RESPIRATORY_TRACT | Status: DC
  Administered 2015-05-22 – 2015-05-24 (×7): 1.25 mg via RESPIRATORY_TRACT
  Filled 2015-05-22 (×7): qty 0.5

## 2015-05-22 MED ORDER — FUROSEMIDE 10 MG/ML IJ SOLN
40.0000 mg | Freq: Once | INTRAMUSCULAR | Status: AC
Start: 2015-05-22 — End: 2015-05-23
  Administered 2015-05-23: 40 mg via INTRAVENOUS
  Filled 2015-05-22: qty 4

## 2015-05-22 MED ORDER — ALBUTEROL SULFATE (2.5 MG/3ML) 0.083% IN NEBU
3.0000 mL | INHALATION_SOLUTION | Freq: Four times a day (QID) | RESPIRATORY_TRACT | Status: DC | PRN
  Administered 2015-05-24 (×2): 3 mL via RESPIRATORY_TRACT
  Filled 2015-05-22 (×2): qty 3

## 2015-05-22 MED ORDER — DABIGATRAN ETEXILATE MESYLATE 75 MG PO CAPS
150.0000 mg | ORAL_CAPSULE | Freq: Two times a day (BID) | ORAL | Status: DC
  Administered 2015-05-23 – 2015-05-30 (×12): 150 mg via ORAL
  Filled 2015-05-22 (×14): qty 1
  Filled 2015-05-22: qty 2
  Filled 2015-05-22 (×2): qty 1

## 2015-05-22 MED ORDER — DABIGATRAN ETEXILATE MESYLATE 150 MG PO CAPS
ORAL_CAPSULE | ORAL | Status: AC
  Filled 2015-05-22: qty 1

## 2015-05-22 MED ORDER — TRAMADOL HCL 50 MG PO TABS: 50.0000 mg | ORAL_TABLET | Freq: Four times a day (QID) | ORAL | Status: DC | PRN

## 2015-05-22 NOTE — ED Provider Notes (Addendum)
CSN: [id number]     Arrival date & time 05/22/15  1823 History   First MD Initiated Contact with Patient 05/22/15 1837     Chief Complaint  Patient presents with  . Shortness of Breath     (Consider location/radiation/quality/duration/timing/severity/associated sxs/prior Treatment) HPI   Patient is a 74 year old female with chronic shortness of breath, chronic A. fib, valvular disease, COPD and congestive heart failure presenting today with shortness of breath. Patient was at home and went to turn up her oxygen from 2.5 to 3 L. At that point her breaker when off. So she had no oxygen for 30 minutes while she was waiting for EMS. Patient reports she's had occasional shortness of breath since Friday. She spent the day outside and she thinks it might be allergies. She's been taking her inhalers as prescribed.  Patient also has small area to the right lateral tongue which had been bleeding. It is not activelybleeding now.  She recently switched from Coumadin to Pradaxa.  Past Medical History  Diagnosis Date  . Arthritis   . Chronic atrial fibrillation     a. previously on Pradaxa - on Coumadin since valve surgery.  . Aortic stenosis     a. Severe - s/p pericardial AVR with MAZE 02/04/15.  Marland Kitchen Pneumonia 09/2014  . History of bronchitis 2015  . Joint pain   . Back pain     reason unknown  . Family history of adverse reaction to anesthesia     uncle with MH in the 60's,cousin in the 43's with MH  . 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 COPD (chronic obstructive pulmonary disease)   . Normal coronary arteries     a. By cath 11/2014.  . Pulmonary  hypertension     a. Mod by cath 11/2014.  Marland Kitchen Chronic diastolic CHF (congestive heart failure)   . Vocal cord paralysis     a. Dx post-AVR in 01/2015. Required PANDA, intubation during that admission.  . Pleural effusion   . AKI (acute kidney injury)     a. H/o AKI after AVR in 01/2015.  . Tracheomalacia   . Anemia     a. 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   Past Surgical History  Procedure Laterality Date  . None    . Left and right heart catheterization with coronary angiogram N/A 11/01/2014    Procedure: LEFT AND RIGHT HEART CATHETERIZATION WITH CORONARY ANGIOGRAM;  Surgeon: [name], MD;  Location: [facility] CATH LAB;  Service: Cardiovascular;  Laterality: N/A;  . Aortic valve replacement N/A 01/19/2015    Procedure: AORTIC VALVE REPLACEMENT (AVR);  Surgeon: Purcell Nails, MD;  Location: [facility] OR;  Service: Open Heart Surgery;  Laterality: N/A;  . Maze N/A 01/19/2015    Procedure: MAZE;  Surgeon: Purcell Nails, MD;  Location: [facility] OR;  Service: Open Heart Surgery;  Laterality: N/A;  . Aortic root enlargement N/A 01/19/2015    Procedure:  AORTIC ROOT ENLARGEMENT;  Surgeon: Purcell Nails, MD;  Location: [facility] OR;  Service: Open Heart Surgery;  Laterality: N/A;  . Tee without cardioversion N/A 01/19/2015    Procedure: TRANSESOPHAGEAL ECHOCARDIOGRAM (TEE);  Surgeon: Purcell Nails, MD;  Location: [facility] OR;  Service: Open Heart Surgery;  Laterality: N/A;   Family History  Problem Relation Age of Onset  . Breast cancer Mother   . Congestive Heart Failure Mother   . Cancer Father     ? Lymphoma  . Malignant hyperthermia Cousin    Social History  Substance Use Topics  . Smoking status: Never Smoker   . Smokeless tobacco: Never Used  . Alcohol Use: No   OB History    No data available     Review of Systems  Constitutional: Negative for activity change and fatigue.  HENT: Positive for mouth sores. Negative for congestion and drooling.   Eyes: Negative for discharge.  Respiratory:  Positive for shortness of breath. Negative for cough and chest tightness.   Cardiovascular: Positive for chest pain.  Gastrointestinal: Negative for abdominal distention.  Genitourinary: Negative for dysuria and difficulty urinating.  Musculoskeletal: Negative for joint swelling.  Skin: Negative for rash.  Allergic/Immunologic: Negative for immunocompromised state.  Neurological: Negative for headaches.  Psychiatric/Behavioral: Negative for behavioral problems and agitation.      Allergies  Bee venom; Latex; and Penicillins  Home Medications   Prior to Admission medications   Medication Sig Start Date End Date Taking? Authorizing Provider  albuterol (PROAIR HFA) 108 (90 BASE) MCG/ACT inhaler Inhale 2 puffs into the lungs 4 (four) times daily as needed for wheezing or shortness of breath.    Historical Provider, MD  albuterol (PROVENTIL) (2.5 MG/3ML) 0.083% nebulizer solution Take 3 mLs (2.5 mg total) by nebulization every 4 (four) hours as needed for wheezing or shortness of breath. 09/15/14   Erick Blinks, MD  budesonide (PULMICORT) 0.25 MG/2ML nebulizer solution Take 2 mLs (0.25 mg total) by nebulization 2 (two) times daily. 04/07/15   Lupita Leash, MD  dabigatran (PRADAXA) 150 MG CAPS capsule Take 1 capsule (150 mg total) by mouth 2 (two) times daily. 04/28/15   Jodelle Gross, NP  diltiazem (CARDIZEM) 30 MG tablet Take 1 tablet (30 mg total) by mouth 2 (two) times daily. 03/20/15   Costin Otelia Sergeant, MD  docusate sodium (COLACE) 100 MG capsule Take 1 capsule (100 mg total) by mouth daily as needed for mild constipation. 04/28/15   Jodelle Gross, NP  Fluticasone Propionate, Inhal, (FLOVENT DISKUS) 250 MCG/BLIST AEPB Inhale 1 puff into the lungs 2 (two) times daily. 04/22/15   Lupita Leash, MD  potassium chloride (K-DUR) 10 MEQ tablet Take 2 tablets (20 mEq total) by mouth daily. 03/28/15   Jodelle Gross, NP  torsemide (DEMADEX) 20 MG tablet Take 2 tablets (40 mg total)  by mouth AC breakfast. 03/20/15   Leatha Gilding, MD  torsemide (DEMADEX) 20 MG tablet Take 1 tablet (20 mg total) by mouth daily after supper. 03/20/15   Costin Otelia Sergeant, MD  traMADol (ULTRAM) 50 MG tablet Take 1 tablet (50 mg total) by mouth every 6 (six) hours as needed (pain). 02/21/15   Wayne E Gold, PA-C   BP 145/73 mmHg  Pulse 82  Temp(Src) 99.6 F (37.6 C) (Oral)  Resp 17  Ht  (1.575 m)  Wt 192 lb (87.091 kg)  BMI 35.11 kg/m2  SpO2 94% Physical Exam  Constitutional: She is oriented to person, place, and time. She appears well-developed and well-nourished.  HENT:  Head: Normocephalic and atraumatic.  Telangiectasia/bruise to lateral tongue. Not actively bleeding. Dried blood on lips and chin from bleed on arrival here.   Eyes: Conjunctivae are normal. Right eye  exhibits no discharge.  Neck: Neck supple.  Cardiovascular: Normal rate, regular rhythm and normal heart sounds.   No murmur heard. Pulmonary/Chest: Effort normal and breath sounds normal. She has no wheezes. She has no rales.  On home 3L  Abdominal: Soft. She exhibits no distension. There is no tenderness.  Musculoskeletal: Normal range of motion. She exhibits edema.  Chronic edema and PVD changes bialtearl lower extremity  Neurological: She is oriented to person, place, and time. No cranial nerve deficit.  Skin: Skin is warm and dry. She is not diaphoretic.  Psychiatric: She has a normal mood and affect.  Nursing note and vitals reviewed.   ED Course  Procedures (including critical care time) Labs Review Labs Reviewed  CBC WITH DIFFERENTIAL/PLATELET  PROTIME-INR  COMPREHENSIVE METABOLIC PANEL    Imaging Review No results found. I have personally reviewed and evaluated these images and lab results as part of my medical decision-making.   EKG Interpretation   Date/Time:  Sunday May 22 2015 18:29:17 EDT Ventricular Rate:  79 PR Interval:    QRS Duration: 88 QT Interval:  377 QTC  Calculation: 432 R Axis:   -130 Text Interpretation:  Atrial fibrillation Probable right ventricular  hypertrophy Inferolateral infarct, old no acute ischemia Atrial  fibrillation No significant change since last tracing Confirmed by  Kandis Mannan ([id number]) on 05/22/2015 6:36:58 PM      MDM   Final diagnoses:  None    Patient is a 74 year old female with chronic shortness of breath, chronic A. fib, valvular disease COPD and CHF. She is presenting today with shortness of breath. Patient is satting 94% on her normal home O2. She is  breathing normally. Not tachypnic. Her lung sounds are normal. Anticipate ability to discharge home. We will give her single DuoNeb get chest x-ray to make sure there is no pneumonia.   As for her tongue we recommend that she follow-up with a dentist for potential biopsy. It is not currently bleeding.  8:52 PM Discuss with cardiology. Patient recently decreased her torsemide dose. We will have her increase her torsemide dose back to how it was prior. She sees [name] for cardiology. Additionally her PT INR are elevated today but she takes Pradaxa. We will have her hold her Pradaxa tonight and tomorrow given the fact that she was bleeding on her tongue when she arrived. Patient has an aortic valve, bioprosthetic. But because of the bleeding on her telangiectasia on her tongue will have her hold it for minimal. If time until she is able to not bleed from her tongue for a suitable time for clot formation.  Courteney Randall An, MD 05/22/15 2054  9:17 PM  Patietn becomes incredibly hypoxic when even just standing at bedside < 84%, will diurese her in the hospital overngiht.    Courteney Randall An, MD 05/22/15 2118

## 2015-05-22 NOTE — ED Notes (Signed)
Per pt she has been having problems with sob since her heart and lung surgery -(May this yr)  However today SOB worstened . Pt noted to have area on Rt side of her tongue that occurred yesterday and is bleeding - is on blood thinners

## 2015-05-22 NOTE — Discharge Instructions (Signed)
We will U to take your torsemide as you did previously. Therefore you need to increase it back to 3 pills a day.  In addition please hold your Pradaxa tonight and hold your Pradaxa tomorrow until you talk to your regular cardiologsit.

## 2015-05-22 NOTE — ED Notes (Signed)
CRITICAL VALUE ALERT  Critical value received:  inr 5.23  Date of notification:  05/22/15  Time of notification:  1953  Critical value read back:Yes.    Nurse who received alert:  Wolfgang Phoenix   MD notified (1st page):  [facility]*  Time of first page:  1952  MD notified (2nd page):  Time of second page:  Responding MD:  Corlis Leak  Time MD responded:  [phone]

## 2015-05-22 NOTE — H&P (Signed)
History and Physical  Brazil Voytko ZOX:[id number] DOB: [birth year] DOA: 05/22/2015  Referring physician: EDP PCP: [name], MD   Chief Complaint: sob  HPI: Sandra Turner is a 74 y.o. female  With h/o afib s/p maze procedure in 02/2015, h/o aortic valve stenosis s/p bioprosthetic valve replacement, h/o diastolic chf and asthma, h/o chronic hypoxic respiratory failure on home oxygen 2.5 liter,  presented to the ED due to sob, increasing bilateral lower extremity edema. She reported her lower extremity edema has resolved after she was discharged in July. But it has gradually become worse. Her diuretic dose was decreased on 8/25. She also reported there might be component of seasonal allergy, since she stayed outside whole day yesterday. She denies fever, no cough, no wheezing, no chest pain. Ed course, cxr bilateral infiltrate consistent with chf, she was given lasix, EDP discussed with cardiology, initially plan was to discharge patient home with outpatient cardiology follow up, however, patient remain sob, hospitalist call to admit patient for chf exacerbation,   Review of Systems:  Detail per HPI, Review of systems are otherwise negative  Past Medical History  Diagnosis Date  . Arthritis   . Chronic atrial fibrillation     a. previously on Pradaxa - on Coumadin since valve surgery.  . Aortic stenosis     a. Severe - s/p pericardial AVR with MAZE 02/04/15.  Marland Kitchen Pneumonia 09/2014  . History of bronchitis 2015  . Joint pain   . Back pain     reason unknown  . Family history of adverse reaction to anesthesia     uncle with MH in the 60's,cousin in the 90's with MH  . 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 S/P aortic valve replacement with bioprosthetic valve and aortic root enlargement 01/19/2015    23 mm [facility] Ease bovine pericardial tissue valve with bovine  pericardial patch enlargement of the aortic root  . COPD (chronic obstructive pulmonary disease)   . Normal coronary arteries     a. By cath 11/2014.  . Pulmonary hypertension     a. Mod by cath 11/2014.  Marland Kitchen Chronic diastolic CHF (congestive heart failure)   . Vocal cord paralysis     a. Dx post-AVR in 01/2015. Required PANDA, intubation during that admission.  . Pleural effusion   . AKI (acute kidney injury)     a. H/o AKI after AVR in 01/2015.  . Tracheomalacia   . Anemia     a. 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   Past Surgical History  Procedure Laterality Date  . None    . Left and right heart catheterization with coronary angiogram N/A 11/01/2014    Procedure: LEFT AND RIGHT HEART CATHETERIZATION WITH CORONARY ANGIOGRAM;  Surgeon: [name], MD;  Location: [facility] CATH LAB;  Service: Cardiovascular;  Laterality: N/A;  . Aortic valve replacement N/A 01/19/2015    Procedure: AORTIC VALVE REPLACEMENT (AVR);  Surgeon: Purcell Nails, MD;  Location: [facility] OR;  Service: Open Heart Surgery;  Laterality: N/A;  . Maze N/A 01/19/2015    Procedure: MAZE;  Surgeon: Purcell Nails, MD;  Location: [facility] OR;  Service: Open Heart Surgery;  Laterality: N/A;  . Aortic root enlargement N/A 01/19/2015    Procedure:  AORTIC ROOT ENLARGEMENT;  Surgeon: Purcell Nails, MD;  Location: [facility] OR;  Service: Open Heart Surgery;  Laterality: N/A;  . Rhae Hammock  without cardioversion N/A 01/19/2015    Procedure: TRANSESOPHAGEAL ECHOCARDIOGRAM (TEE);  Surgeon: Purcell Nails, MD;  Location: [facility] OR;  Service: Open Heart Surgery;  Laterality: N/A;   Social History:  reports that she has never smoked. She has never used smokeless tobacco. She reports that she does not drink alcohol or use illicit drugs. Patient lives at home & is able to participate in activities of daily living independently , on home oxygen  Allergies  Allergen Reactions  . Bee Venom Swelling  . Latex Rash  . Penicillins Rash    Family History  Problem  Relation Age of Onset  . Breast cancer Mother   . Congestive Heart Failure Mother   . Cancer Father     ? Lymphoma  . Malignant hyperthermia Cousin       Prior to Admission medications   Medication Sig Start Date End Date Taking? Authorizing Provider  albuterol (PROAIR HFA) 108 (90 BASE) MCG/ACT inhaler Inhale 2 puffs into the lungs 4 (four) times daily as needed for wheezing or shortness of breath.   Yes Historical Provider, MD  albuterol (PROVENTIL) (2.5 MG/3ML) 0.083% nebulizer solution Take 3 mLs (2.5 mg total) by nebulization every 4 (four) hours as needed for wheezing or shortness of breath. 09/15/14  Yes Erick Blinks, MD  amiodarone (PACERONE) 200 MG tablet Take 200 mg by mouth daily. 05/09/15  Yes Historical Provider, MD  budesonide (PULMICORT) 0.25 MG/2ML nebulizer solution Take 2 mLs (0.25 mg total) by nebulization 2 (two) times daily. 04/07/15  Yes Lupita Leash, MD  dabigatran (PRADAXA) 150 MG CAPS capsule Take 1 capsule (150 mg total) by mouth 2 (two) times daily. 04/28/15  Yes Jodelle Gross, NP  diltiazem (CARDIZEM) 30 MG tablet Take 1 tablet (30 mg total) by mouth 2 (two) times daily. 03/20/15  Yes Costin Otelia Sergeant, MD  Fluticasone Propionate, Inhal, (FLOVENT DISKUS) 250 MCG/BLIST AEPB Inhale 1 puff into the lungs 2 (two) times daily. 04/22/15  Yes Lupita Leash, MD  magnesium oxide (MAG-OX) 400 MG tablet Take 400 mg by mouth 2 (two) times daily.   Yes Historical Provider, MD  potassium chloride (K-DUR) 10 MEQ tablet Take 2 tablets (20 mEq total) by mouth daily. 03/28/15  Yes Jodelle Gross, NP  torsemide (DEMADEX) 20 MG tablet Take 2 tablets (40 mg total) by mouth AC breakfast. Patient taking differently: Take 20 mg by mouth 2 (two) times daily.  03/20/15  Yes Costin Otelia Sergeant, MD  traMADol (ULTRAM) 50 MG tablet Take 1 tablet (50 mg total) by mouth every 6 (six) hours as needed (pain). 02/21/15  Yes Wayne E Gold, PA-C  docusate sodium (COLACE) 100 MG capsule Take 1  capsule (100 mg total) by mouth daily as needed for mild constipation. Patient not taking: Reported on 05/22/2015 04/28/15   Jodelle Gross, NP  torsemide (DEMADEX) 20 MG tablet Take 1 tablet (20 mg total) by mouth daily after supper. Patient not taking: Reported on 05/22/2015 03/20/15   Leatha Gilding, MD    Physical Exam: BP 144/75 mmHg  Pulse 76  Temp(Src) 98.8 F (37.1 C) (Oral)  Resp 16  Ht  (1.575 m)  Wt 192 lb (87.091 kg)  BMI 35.11 kg/m2  SpO2 93%  General:  Mild tachypneic at rest. Eyes: PERRL ENT: unremarkable Neck: supple, no JVD Cardiovascular: IRRR, + murmur Respiratory: no obvious wheezing, diminished at basis. Abdomen: soft/ND/ND, positive bowel sounds Skin: no rash Musculoskeletal:  2-3+ Pitting edema Psychiatric: calm/cooperative Neurologic:  no focal findings            Labs on Admission:  Basic Metabolic Panel:  Recent Labs Lab 05/22/15 1905  NA 137  K 4.5  CL 100*  CO2 29  GLUCOSE 129*  BUN 23*  CREATININE 1.63*  CALCIUM 8.3*   Liver Function Tests:  Recent Labs Lab 05/22/15 1905  AST 27  ALT 14  ALKPHOS 92  BILITOT 0.7  PROT 7.3  ALBUMIN 3.6   No results for input(s): LIPASE, AMYLASE in the last 168 hours. No results for input(s): AMMONIA in the last 168 hours. CBC:  Recent Labs Lab 05/22/15 1905  WBC 6.7  NEUTROABS 5.4  HGB 8.7*  HCT 27.4*  MCV 90.4  PLT 155   Cardiac Enzymes: No results for input(s): CKTOTAL, CKMB, CKMBINDEX, TROPONINI in the last 168 hours.  BNP (last 3 results)  Recent Labs  02/07/15 0520 02/12/15 0435 03/12/15 2156  BNP 204.8* 271.9* 178.0*    ProBNP (last 3 results) No results for input(s): PROBNP in the last 8760 hours.  CBG: No results for input(s): GLUCAP in the last 168 hours.  Radiological Exams on Admission: Dg Chest 2 View  05/22/2015   CLINICAL DATA:  Shortness of breath since prior new lung surgery in May of the shear now worsened, chronic diastolic CHF, COPD, atrial  fibrillation, post AVR and Maze procedure, pulmonary hypertension, history pneumonia  EXAM: CHEST  2 VIEW  COMPARISON:  04/07/2015  FINDINGS: Enlargement of cardiac silhouette post median sternotomy, AVR, and LEFT atrial appendage occlusion.  Atherosclerotic calcification aorta.  Bibasilar pleural effusions and atelectasis greater on LEFT.  Diffuse BILATERAL pulmonary infiltrates asymmetrically greater on RIGHT, could represent asymmetric edema or pneumonia, favor edema.  No pneumothorax.  Bones demineralized.  IMPRESSION: Enlargement of cardiac silhouette post AVR with asymmetric infiltrates RIGHT greater than LEFT, bibasilar effusions and atelectasis, favor pulmonary edema over pneumonia.   Electronically Signed   By: Ulyses Southward M.D.   On: 05/22/2015 19:39    EKG: Independently reviewed. afib.  Assessment/Plan Present on Admission:  **None**  Acute on chronic hypoxic respiratory failure: on home oxygen. Now increased oxygen requirement.  Acute on chronic diastolic CHF exacerbation: presented with sob, bilateral lower extremity edema. Lasix  iv given in the ED, will place on lasix  iv bid.   Bilateral lower extremity edema: likely from chf, patient has been on anticoagulation, less likely DVT, but will check venous doppler bilateral lower extremity.  Asthma: no wheezing, good air movement, continue home meds. Patient has visible tachypnea at rest, reported seasonal allergy component, will give iv mag and oral steroids. start singulair.  Chronic afib, s/p maze procedure in 02/2015. Currently rate controlled, continue cardizem/pradaxa. Per recent cardiology note, amiodarone was discontinued.  H/o aortic stenosis s/p bioprosthetic valve replacement in 02/2015     DVT prophylaxis: on pradaxa  Consultants: EDP talked to cardiology  Code Status: full   Family Communication:  Patient   Disposition Plan: admit to tele obs  Time spent:  Xu,Fang MD, PhD [facility] Pager [phone] If 7PM-7AM, please contact night-coverage at [email or web address], password [facility]

## 2015-05-22 NOTE — ED Notes (Signed)
Attempted to ambulate pt but pt could not do anymore than stand beside the bed with 2.5L O2 like she wears at home and her O2 sats dropped from 93% to 84% with increased SOB with any movement. Dr. Corlis Leak notified.

## 2015-05-22 NOTE — ED Notes (Signed)
O2 is presently at 3 l/min.. Per pt she keeps hers at 2.5 at home .

## 2015-05-22 NOTE — Progress Notes (Signed)
Notified MD, pt is on the floor.

## 2015-05-23 ENCOUNTER — Observation Stay (HOSPITAL_COMMUNITY): Payer: Medicare Other

## 2015-05-23 DIAGNOSIS — I5043 Acute on chronic combined systolic (congestive) and diastolic (congestive) heart failure: Principal | ICD-10-CM

## 2015-05-23 DIAGNOSIS — J9611 Chronic respiratory failure with hypoxia: Secondary | ICD-10-CM | POA: Diagnosis not present

## 2015-05-23 DIAGNOSIS — Z7901 Long term (current) use of anticoagulants: Secondary | ICD-10-CM | POA: Diagnosis not present

## 2015-05-23 DIAGNOSIS — I5033 Acute on chronic diastolic (congestive) heart failure: Secondary | ICD-10-CM | POA: Diagnosis not present

## 2015-05-23 DIAGNOSIS — Z953 Presence of xenogenic heart valve: Secondary | ICD-10-CM | POA: Diagnosis not present

## 2015-05-23 DIAGNOSIS — R739 Hyperglycemia, unspecified: Secondary | ICD-10-CM | POA: Diagnosis not present

## 2015-05-23 DIAGNOSIS — E669 Obesity, unspecified: Secondary | ICD-10-CM | POA: Diagnosis present

## 2015-05-23 DIAGNOSIS — E876 Hypokalemia: Secondary | ICD-10-CM | POA: Diagnosis present

## 2015-05-23 DIAGNOSIS — K148 Other diseases of tongue: Secondary | ICD-10-CM | POA: Diagnosis present

## 2015-05-23 DIAGNOSIS — Y95 Nosocomial condition: Secondary | ICD-10-CM | POA: Diagnosis present

## 2015-05-23 DIAGNOSIS — I350 Nonrheumatic aortic (valve) stenosis: Secondary | ICD-10-CM | POA: Diagnosis present

## 2015-05-23 DIAGNOSIS — Z8249 Family history of ischemic heart disease and other diseases of the circulatory system: Secondary | ICD-10-CM | POA: Diagnosis not present

## 2015-05-23 DIAGNOSIS — Z803 Family history of malignant neoplasm of breast: Secondary | ICD-10-CM | POA: Diagnosis not present

## 2015-05-23 DIAGNOSIS — J45901 Unspecified asthma with (acute) exacerbation: Secondary | ICD-10-CM | POA: Diagnosis present

## 2015-05-23 DIAGNOSIS — J449 Chronic obstructive pulmonary disease, unspecified: Secondary | ICD-10-CM | POA: Diagnosis present

## 2015-05-23 DIAGNOSIS — R609 Edema, unspecified: Secondary | ICD-10-CM | POA: Diagnosis not present

## 2015-05-23 DIAGNOSIS — L988 Other specified disorders of the skin and subcutaneous tissue: Secondary | ICD-10-CM | POA: Diagnosis present

## 2015-05-23 DIAGNOSIS — I481 Persistent atrial fibrillation: Secondary | ICD-10-CM | POA: Diagnosis not present

## 2015-05-23 DIAGNOSIS — Z954 Presence of other heart-valve replacement: Secondary | ICD-10-CM

## 2015-05-23 DIAGNOSIS — I4891 Unspecified atrial fibrillation: Secondary | ICD-10-CM | POA: Diagnosis not present

## 2015-05-23 DIAGNOSIS — I272 Other secondary pulmonary hypertension: Secondary | ICD-10-CM | POA: Diagnosis present

## 2015-05-23 DIAGNOSIS — N179 Acute kidney failure, unspecified: Secondary | ICD-10-CM | POA: Diagnosis present

## 2015-05-23 DIAGNOSIS — M1990 Unspecified osteoarthritis, unspecified site: Secondary | ICD-10-CM | POA: Diagnosis present

## 2015-05-23 DIAGNOSIS — R0602 Shortness of breath: Secondary | ICD-10-CM | POA: Diagnosis present

## 2015-05-23 DIAGNOSIS — R040 Epistaxis: Secondary | ICD-10-CM | POA: Diagnosis present

## 2015-05-23 DIAGNOSIS — N183 Chronic kidney disease, stage 3 (moderate): Secondary | ICD-10-CM | POA: Diagnosis not present

## 2015-05-23 DIAGNOSIS — I482 Chronic atrial fibrillation: Secondary | ICD-10-CM | POA: Diagnosis not present

## 2015-05-23 DIAGNOSIS — J9612 Chronic respiratory failure with hypercapnia: Secondary | ICD-10-CM

## 2015-05-23 DIAGNOSIS — Z952 Presence of prosthetic heart valve: Secondary | ICD-10-CM | POA: Diagnosis not present

## 2015-05-23 DIAGNOSIS — D649 Anemia, unspecified: Secondary | ICD-10-CM | POA: Diagnosis present

## 2015-05-23 DIAGNOSIS — J181 Lobar pneumonia, unspecified organism: Secondary | ICD-10-CM | POA: Diagnosis not present

## 2015-05-23 DIAGNOSIS — J9621 Acute and chronic respiratory failure with hypoxia: Secondary | ICD-10-CM | POA: Diagnosis not present

## 2015-05-23 DIAGNOSIS — I509 Heart failure, unspecified: Secondary | ICD-10-CM | POA: Diagnosis not present

## 2015-05-23 DIAGNOSIS — N184 Chronic kidney disease, stage 4 (severe): Secondary | ICD-10-CM | POA: Diagnosis present

## 2015-05-23 DIAGNOSIS — Z6835 Body mass index (BMI) 35.0-35.9, adult: Secondary | ICD-10-CM | POA: Diagnosis not present

## 2015-05-23 DIAGNOSIS — Z807 Family history of other malignant neoplasms of lymphoid, hematopoietic and related tissues: Secondary | ICD-10-CM | POA: Diagnosis not present

## 2015-05-23 DIAGNOSIS — Z66 Do not resuscitate: Secondary | ICD-10-CM | POA: Diagnosis present

## 2015-05-23 DIAGNOSIS — Z9981 Dependence on supplemental oxygen: Secondary | ICD-10-CM | POA: Diagnosis not present

## 2015-05-23 LAB — COMPREHENSIVE METABOLIC PANEL
ALBUMIN: 3.6 g/dL (ref 3.5–5.0)
ALT: 17 U/L (ref 14–54)
AST: 27 U/L (ref 15–41)
Alkaline Phosphatase: 93 U/L (ref 38–126)
Anion gap: 7 (ref 5–15)
BUN: 26 mg/dL — AB (ref 6–20)
CHLORIDE: 99 mmol/L — AB (ref 101–111)
CO2: 32 mmol/L (ref 22–32)
Calcium: 8.6 mg/dL — ABNORMAL LOW (ref 8.9–10.3)
Creatinine, Ser: 1.63 mg/dL — ABNORMAL HIGH (ref 0.44–1.00)
GFR calc Af Amer: 35 mL/min — ABNORMAL LOW (ref >60)
GFR calc non Af Amer: 30 mL/min — ABNORMAL LOW (ref >60)
GLUCOSE: 145 mg/dL — AB (ref 65–99)
POTASSIUM: 4.6 mmol/L (ref 3.5–5.1)
SODIUM: 138 mmol/L (ref 135–145)
Total Bilirubin: 0.7 mg/dL (ref 0.3–1.2)
Total Protein: 7.6 g/dL (ref 6.5–8.1)

## 2015-05-23 LAB — CBC
HCT: 28 % — ABNORMAL LOW (ref 36.0–46.0)
Hemoglobin: 8.7 g/dL — ABNORMAL LOW (ref 12.0–15.0)
MCH: 28 pg (ref 26.0–34.0)
MCHC: 31.1 g/dL (ref 30.0–36.0)
MCV: 90 fL (ref 78.0–100.0)
PLATELETS: 147 10*3/uL — AB (ref 150–400)
RBC: 3.11 MIL/uL — ABNORMAL LOW (ref 3.87–5.11)
RDW: 15.9 % — AB (ref 11.5–15.5)
WBC: 5.4 10*3/uL (ref 4.0–10.5)

## 2015-05-23 LAB — MAGNESIUM: MAGNESIUM: 3 mg/dL — AB (ref 1.7–2.4)

## 2015-05-23 MED ORDER — BENZOCAINE 10 % MT GEL
Freq: Four times a day (QID) | OROMUCOSAL | Status: DC | PRN
  Administered 2015-05-23: 22:00:00 via OROMUCOSAL
  Filled 2015-05-23: qty 9.4

## 2015-05-23 MED ORDER — FUROSEMIDE 10 MG/ML IJ SOLN
80.0000 mg | Freq: Two times a day (BID) | INTRAMUSCULAR | Status: DC
  Administered 2015-05-23 – 2015-05-25 (×4): 80 mg via INTRAVENOUS
  Filled 2015-05-23 (×4): qty 8

## 2015-05-23 NOTE — Consult Note (Signed)
Primary Physician: Primary Cardiologist:  Pearlean Brownie to see re CHF  HPI: Pt is a 74 yo with history of AS (s/p AVR), diastolic CHF, asthma, COPD ( on O2 at hoem).  Presented with SOB and LE edema.  Was admitted in July with edema.   The pt was seen by Harriet Pho in cardiology clnic on 8/25   Feeling good at time.  Torsemide at that visit was 40/20 qd.  Amiodarone was stopped due to skin peeling  Since that clinic visist she says that she was doing really good  No SOB Last week she noticed some swelling in legs  ON Fri got SOB  COntinued aturday and Sunday  Came to ER  Edema continued  No CP    Since admit she says her breathing is a little better but not at baseline  Denies change in diet or fluid intake.  No palpitaitons.  NO CP   Pt does not weigh herself at home       Past Medical History  Diagnosis Date  . Arthritis   . Chronic atrial fibrillation     a. previously on Pradaxa - on Coumadin since valve surgery.  . Aortic stenosis     a. Severe - s/p pericardial AVR with MAZE 02/04/15.  Marland Kitchen Pneumonia 09/2014  . History of bronchitis 2015  . Joint pain   . Back pain     reason unknown  . Family history of adverse reaction to anesthesia     uncle with MH in the 60's,cousin in the 62's with MH  . 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 S/P aortic valve replacement with bioprosthetic valve and aortic root enlargement 01/19/2015    23 mm [facility] Ease bovine pericardial tissue valve with bovine pericardial patch enlargement of the aortic root  . COPD (chronic obstructive pulmonary disease)   . Normal coronary arteries     a. By cath 11/2014.  . Pulmonary hypertension     a. Mod by cath 11/2014.  Marland Kitchen Chronic diastolic CHF (congestive heart failure)   . Vocal cord paralysis     a. Dx post-AVR in 01/2015. Required PANDA, intubation during that  admission.  . Pleural effusion   . AKI (acute kidney injury)     a. H/o AKI after AVR in 01/2015.  . Tracheomalacia   . Anemia     a. 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    Medications Prior to Admission  Medication Sig Dispense Refill  . albuterol (PROAIR HFA) 108 (90 BASE) MCG/ACT inhaler Inhale 2 puffs into the lungs 4 (four) times daily as needed for wheezing or shortness of breath.    Marland Kitchen albuterol (PROVENTIL) (2.5 MG/3ML) 0.083% nebulizer solution Take 3 mLs (2.5 mg total) by nebulization every 4 (four) hours as needed for wheezing or shortness of breath. 75 mL 12  . amiodarone (PACERONE) 200 MG tablet Take 200 mg by mouth daily.    . budesonide (PULMICORT) 0.25 MG/2ML nebulizer solution Take 2 mLs (0.25 mg total) by nebulization 2 (two) times daily. 60 mL 11  . dabigatran (PRADAXA) 150 MG CAPS capsule Take 1 capsule (150 mg total) by mouth 2 (two) times daily. 60 capsule 6  . diltiazem (CARDIZEM) 30 MG tablet Take 1 tablet (30 mg total) by mouth 2 (two) times daily. 60 tablet 2  . Fluticasone Propionate, Inhal, (FLOVENT DISKUS)  250 MCG/BLIST AEPB Inhale 1 puff into the lungs 2 (two) times daily. 60 each 3  . magnesium oxide (MAG-OX) 400 MG tablet Take 400 mg by mouth 2 (two) times daily.    . potassium chloride (K-DUR) 10 MEQ tablet Take 2 tablets (20 mEq total) by mouth daily. 60 tablet 6  . torsemide (DEMADEX) 20 MG tablet Take 2 tablets (40 mg total) by mouth AC breakfast. (Patient taking differently: Take 20 mg by mouth 2 (two) times daily. ) 90 tablet 3  . traMADol (ULTRAM) 50 MG tablet Take 1 tablet (50 mg total) by mouth every 6 (six) hours as needed (pain). 50 tablet 0  . docusate sodium (COLACE) 100 MG capsule Take 1 capsule (100 mg total) by mouth daily as needed for mild constipation. (Patient not taking: Reported on 05/22/2015) 30 capsule 1  . torsemide (DEMADEX) 20 MG tablet Take 1 tablet (20 mg total) by mouth daily after supper. (Patient not taking: Reported on 05/22/2015) 30 tablet  0     . budesonide  0.25 mg Nebulization BID  . dabigatran  150 mg Oral BID  . diltiazem  30 mg Oral BID  . furosemide  40 mg Intravenous BID  . levalbuterol  1.25 mg Nebulization 3 times per day  . magnesium oxide  400 mg Oral BID  . montelukast  10 mg Oral QHS  . potassium chloride  20 mEq Oral Daily    Infusions:    Allergies  Allergen Reactions  . Bee Venom Swelling  . Latex Rash  . Penicillins Rash    Social History   Social History  . Marital Status: Divorced    Spouse Name: N/A  . Number of Children: 0  . Years of Education: N/A   Occupational History  . Hairdresser    Social History Main Topics  . Smoking status: Never Smoker   . Smokeless tobacco: Never Used  . Alcohol Use: No  . Drug Use: No  . Sexual Activity: No   Other Topics Concern  . Not on file   Social History Narrative    Family History  Problem Relation Age of Onset  . Breast cancer Mother   . Congestive Heart Failure Mother   . Cancer Father     ? Lymphoma  . Malignant hyperthermia Cousin     REVIEW OF SYSTEMS:  All systems reviewed  Negative to the above problem except as noted above.    PHYSICAL EXAM: Filed Vitals:   05/23/15 0501  BP: 129/48  Pulse: 70  Temp: 98.4 F (36.9 C)  Resp: 18     Intake/Output Summary (Last 24 hours) at 05/23/15 0909 Last data filed at 05/23/15 0501  Gross per 24 hour  Intake    240 ml  Output   1200 ml  Net   -960 ml    General:  Well appearing. No respiratory difficulty HEENT: normal Neck: supple. JVP is increased   Carotids 2+ bilat; no bruits. No lymphadenopathy or thryomegaly appreciated. Cor: PMI nondisplaced. Irregular rate & rhythm. No rubs, gallops Gr I-II/VI syst murmur. Lungs: Relatively clear Abdomen: soft, nontender, nondistended. No hepatosplenomegaly.Distended   No bruits or masses. Good bowel sounds. Extremities: no cyanosis, clubbing  1+ edema  Chornic skin changes   Neuro: alert & oriented x 3, cranial nerves  grossly intact. moves all 4 extremities w/o difficulty. Affect pleasant.  ECG:  Atrial fib  79 bpm     Results for orders placed or performed during the hospital encounter  of 05/22/15 (from the past 24 hour(s))  CBC with Differential     Status: Abnormal   Collection Time: 05/22/15  7:05 PM  Result Value Ref Range   WBC 6.7 4.0 - 10.5 K/uL   RBC 3.03 (L) 3.87 - 5.11 MIL/uL   Hemoglobin 8.7 (L) 12.0 - 15.0 g/dL   HCT 16.1 (L) 09.6 - 04.5 %   MCV 90.4 78.0 - 100.0 fL   MCH 28.7 26.0 - 34.0 pg   MCHC 31.8 30.0 - 36.0 g/dL   RDW 40.9 (H) 81.1 - 91.4 %   Platelets 155 150 - 400 K/uL   Neutrophils Relative % 80 %   Neutro Abs 5.4 1.7 - 7.7 K/uL   Lymphocytes Relative 9 %   Lymphs Abs 0.6 (L) 0.7 - 4.0 K/uL   Monocytes Relative 9 %   Monocytes Absolute 0.6 0.1 - 1.0 K/uL   Eosinophils Relative 2 %   Eosinophils Absolute 0.2 0.0 - 0.7 K/uL   Basophils Relative 0 %   Basophils Absolute 0.0 0.0 - 0.1 K/uL  Protime-INR     Status: Abnormal   Collection Time: 05/22/15  7:05 PM  Result Value Ref Range   Prothrombin Time 46.5 (H) 11.6 - 15.2 seconds   INR 5.23 (HH) 0.00 - 1.49  Comprehensive metabolic panel     Status: Abnormal   Collection Time: 05/22/15  7:05 PM  Result Value Ref Range   Sodium 137 135 - 145 mmol/L   Potassium 4.5 3.5 - 5.1 mmol/L   Chloride 100 (L) 101 - 111 mmol/L   CO2 29 22 - 32 mmol/L   Glucose, Bld 129 (H) 65 - 99 mg/dL   BUN 23 (H) 6 - 20 mg/dL   Creatinine, Ser 7.82 (H) 0.44 - 1.00 mg/dL   Calcium 8.3 (L) 8.9 - 10.3 mg/dL   Total Protein 7.3 6.5 - 8.1 g/dL   Albumin 3.6 3.5 - 5.0 g/dL   AST 27 15 - 41 U/L   ALT 14 14 - 54 U/L   Alkaline Phosphatase 92 38 - 126 U/L   Total Bilirubin 0.7 0.3 - 1.2 mg/dL   GFR calc non Af Amer 30 (L) >60 mL/min   GFR calc Af Amer 35 (L) >60 mL/min   Anion gap 8 5 - 15  CBC     Status: Abnormal   Collection Time: 05/23/15  7:08 AM  Result Value Ref Range   WBC 5.4 4.0 - 10.5 K/uL   RBC 3.11 (L) 3.87 - 5.11 MIL/uL    Hemoglobin 8.7 (L) 12.0 - 15.0 g/dL   HCT 95.6 (L) 21.3 - 08.6 %   MCV 90.0 78.0 - 100.0 fL   MCH 28.0 26.0 - 34.0 pg   MCHC 31.1 30.0 - 36.0 g/dL   RDW 57.8 (H) 46.9 - 62.9 %   Platelets 147 (L) 150 - 400 K/uL  Comprehensive metabolic panel     Status: Abnormal   Collection Time: 05/23/15  7:08 AM  Result Value Ref Range   Sodium 138 135 - 145 mmol/L   Potassium 4.6 3.5 - 5.1 mmol/L   Chloride 99 (L) 101 - 111 mmol/L   CO2 32 22 - 32 mmol/L   Glucose, Bld 145 (H) 65 - 99 mg/dL   BUN 26 (H) 6 - 20 mg/dL   Creatinine, Ser 5.28 (H) 0.44 - 1.00 mg/dL   Calcium 8.6 (L) 8.9 - 10.3 mg/dL   Total Protein 7.6 6.5 - 8.1 g/dL  Albumin 3.6 3.5 - 5.0 g/dL   AST 27 15 - 41 U/L   ALT 17 14 - 54 U/L   Alkaline Phosphatase 93 38 - 126 U/L   Total Bilirubin 0.7 0.3 - 1.2 mg/dL   GFR calc non Af Amer 30 (L) >60 mL/min   GFR calc Af Amer 35 (L) >60 mL/min   Anion gap 7 5 - 15  Magnesium     Status: Abnormal   Collection Time: 05/23/15  7:08 AM  Result Value Ref Range   Magnesium 3.0 (H) 1.7 - 2.4 mg/dL   Dg Chest 2 View  1/61/0960   CLINICAL DATA:  Shortness of breath since prior new lung surgery in May of the shear now worsened, chronic diastolic CHF, COPD, atrial fibrillation, post AVR and Maze procedure, pulmonary hypertension, history pneumonia  EXAM: CHEST  2 VIEW  COMPARISON:  04/07/2015  FINDINGS: Enlargement of cardiac silhouette post median sternotomy, AVR, and LEFT atrial appendage occlusion.  Atherosclerotic calcification aorta.  Bibasilar pleural effusions and atelectasis greater on LEFT.  Diffuse BILATERAL pulmonary infiltrates asymmetrically greater on RIGHT, could represent asymmetric edema or pneumonia, favor edema.  No pneumothorax.  Bones demineralized.  IMPRESSION: Enlargement of cardiac silhouette post AVR with asymmetric infiltrates RIGHT greater than LEFT, bibasilar effusions and atelectasis, favor pulmonary edema over pneumonia.   Electronically Signed   By: Ulyses Southward M.D.    On: 05/22/2015 19:39     ASSESSMENT:  74 yo with history of chronic diastolic / systolic  CHF, atrial fib and CRI Presents with swelling of legs and then SOB   1  Acute on chronic diastolic / systolic CHF Echo in July LVEF 74 yo 61^  Severe LAE  RV dilted with depressed function.    ON exam evid of volume increase  I would increase lasix to 80 IV bid  Strict I/O  Pt needs to weigh self at home  May have been able to increase dosing as outpt if wt up and avoid hospitalization.  2  Chronic atrial fib  S/p MAZE procedure    COntinue on current regimen  Follow rates on tele  Remains on pradaxa  3  CKD  Cr is minimally increased  Follow  4.  ANemia  eeds to be followed closely on pradaxa  No indication for tx.    5  AS  Pt s/p AVR (bioprosthesis) in May 2016

## 2015-05-23 NOTE — Progress Notes (Signed)
PROGRESS NOTE  Sandra Turner ZOX:[id number] DOB: 09-Sep-1940 DOA: 05/22/2015 PCP: [name], MD  Summary: 74 y.o. female a hx of atrial fibrillation, diastolic CHF, asthma, chronic hypoxic respiratory failure (on 2.5L of 02 at home) presented with SOB and BLE edema. States her diuretic dose was decreased on 8/25. In the ED, CXR revealed bilateral infiltrated consistent with CHF.  Labs revealed AKI with BUN 23, Creatinine 1.63, INR of 5.23, and hcg of 8.7. Admitted for further evaluation and management.    Assessment/Plan: 1. Acute on chronic diastolic CHF exacerbation, somewhat improved. Continue IV Lasix and monitor.  Cardiology consulted and recommends increasing Lasix to  IV 2. Acute on chronic hypoxic respiratory failure. On 2.5L O2 at home. Appears stable. 3. AKI superimposed on CKD stage III, suspect relative hypoperfusion of kidneys related to heart failure. Adequate UOP. Continue to trend.  4. Bilateral lower extremity edema, likely related to CHF. Continue IV Lasix and monitor. 5. Asthma, stable.  6. Chronic atrial fibrillation, stable, resume Pradaxa.  7. Severe aortic stenosis s/p AVR with bioprosthetic valve May 2016, asymptomatic 8. Tongue lesion. Worrisome of malignancy, recommend outpatient f/u with ENT.    Overall improving. Continue IV Lasix. Check BMP in AM.  Code Status: Full  DVT prophylaxis: Pradaxa Family Communication:none. Discussed with patient who understands and has no concerns at this time. Disposition Plan: Anticipate discharge within 1-2 days.  Brendia Sacks, MD  [facility]  Pager [phone] If 7PM-7AM, please contact night-coverage at [email or web address], password [facility] 05/23/2015, 7:27 AM    Consultants:    Procedures:    Antibiotics:    HPI/Subjective: Feels improved. Able to sleep last night and breathing is significantly improved. Denies any pain, nausea, or vomiting.  Complains of a sore of the right side of her tongue that  appeared 2 days ago. Is tender and bleeds intermittently.   Objective: Filed Vitals:   05/22/15 2200 05/22/15 2234 05/22/15 2309 05/23/15 0501  BP: 144/75 144/63  129/48  Pulse: 76 66  70  Temp:  98.3 F (36.8 C)  98.4 F (36.9 C)  TempSrc:  Oral  Oral  Resp: Height:      Weight:  100.517 kg (221 lb 9.6 oz)    SpO2: 93% 90% 91% 98%    Intake/Output Summary (Last 24 hours) at 05/23/15 0727 Last data filed at 05/23/15 0501  Gross per 24 hour  Intake    240 ml  Output   1200 ml  Net   -960 ml     Filed Weights   05/22/15 1829 05/22/15 2234  Weight: 87.091 kg (192 lb) 100.517 kg (221 lb 9.6 oz)    Exam:    VSS, not hypoxic on 3L, afebrile General:  Appears calm and comfortable Eyes: PERRL, normal lids, irises & conjunctiva ENT: grossly normal hearing and lips, irregular white lesion on right lateral aspect of the tongue Neck: no LAD, masses or thyromegaly Cardiovascular: RRR, no r/g.  3/6 holosystolic murmur RUSB, 2+ pitting BLE edema Respiratory: no wheezing or rhonchi. Bilateral rales, mildly increased respiratory effort Abdomen: soft, ntnd Skin: Chronic venous stasis changes on BLE. Musculoskeletal: grossly normal tone BUE/BLE Psychiatric: grossly normal mood and affect, speech fluent and appropriate Neurologic: grossly non-focal.  New data reviewed:  INR 5.23 (on Pradaxa)  UOP 1200  Hgb stable at 8.7, Platelets 147  BUN and Creatinine without significant change, 26/1.63  EKG - atrial fibrillation  Pertinent data since admission:  CXR IMPRESSION: Enlargement of cardiac silhouette post  AVR with asymmetric infiltrates RIGHT greater than LEFT, bibasilar effusions and atelectasis, favor pulmonary edema over pneumonia.  US of the BLE IMPRESSION: 1. No evidence of deep venous thrombosis in either lower extremity. 2. Increased pulsatility of the venous waveforms bilaterally. This can be seen in the setting of elevated right heart  pressures. Differential considerations include tricuspid regurgitation, right heart dysfunction, pulmonary arterial hypertension and COPD. 3. Superficial subcutaneous edema in the lower extremities from the knees to the ankles. Pending data:    Scheduled Meds: . budesonide  0.25 mg Nebulization BID  . dabigatran  150 mg Oral BID  . diltiazem  30 mg Oral BID  . furosemide  40 mg Intravenous BID  . levalbuterol  1.25 mg Nebulization 3 times per day  . magnesium oxide  400 mg Oral BID  . montelukast  10 mg Oral QHS  . potassium chloride  20 mEq Oral Daily  . 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  05/23/2015  I personally performed the services described in this documentation. All medical record entries made by the scribe were at my direction. I have reviewed the chart and agree that the record reflects my personal performance and is accurate and complete. Brendia Sacks, MD

## 2015-05-23 NOTE — Care Management Note (Signed)
Case Management Note  Patient Details  Name: Sandra Turner MRN: [id number] Date of Birth: [birth year]  Expected Discharge Date:    05/25/2015              Expected Discharge Plan:  Home w Home Health Services  In-House Referral:  NA  Discharge planning Services  CM Consult  Post Acute Care Choice:  NA Choice offered to:  NA  DME Arranged:    DME Agency:     HH Arranged:    HH Agency:     Status of Service:  In process, will continue to follow  Medicare Important Message Given:    Date Medicare IM Given:    Medicare IM give by:    Date Additional Medicare IM Given:    Additional Medicare Important Message give by:     If discussed at Long Length of Stay Meetings, dates discussed:    Additional Comments: Pt is from home, lives alone and is independent with ADL's. Pt has home O2 and neb machine at home. Pt has used [facility] in the past for [facility] services. Pt admitted with CHF exacerbation. Anticipate pt will need HH at DC. Pt says she would like to use [facility] again if she needs it. Will cont to follow for DC planning.  Malcolm Metro, RN 05/23/2015, 1:54 PM

## 2015-05-24 DIAGNOSIS — K148 Other diseases of tongue: Secondary | ICD-10-CM

## 2015-05-24 DIAGNOSIS — I5033 Acute on chronic diastolic (congestive) heart failure: Secondary | ICD-10-CM

## 2015-05-24 DIAGNOSIS — I4891 Unspecified atrial fibrillation: Secondary | ICD-10-CM

## 2015-05-24 DIAGNOSIS — R609 Edema, unspecified: Secondary | ICD-10-CM | POA: Insufficient documentation

## 2015-05-24 DIAGNOSIS — I50810 Right heart failure, unspecified: Secondary | ICD-10-CM

## 2015-05-24 DIAGNOSIS — J9611 Chronic respiratory failure with hypoxia: Secondary | ICD-10-CM

## 2015-05-24 DIAGNOSIS — I509 Heart failure, unspecified: Secondary | ICD-10-CM

## 2015-05-24 LAB — BASIC METABOLIC PANEL
Anion gap: 7 (ref 5–15)
BUN: 36 mg/dL — AB (ref 6–20)
CALCIUM: 8.7 mg/dL — AB (ref 8.9–10.3)
CHLORIDE: 100 mmol/L — AB (ref 101–111)
CO2: 32 mmol/L (ref 22–32)
CREATININE: 1.58 mg/dL — AB (ref 0.44–1.00)
GFR, EST AFRICAN AMERICAN: 36 mL/min — AB (ref >60)
GFR, EST NON AFRICAN AMERICAN: 31 mL/min — AB (ref >60)
Glucose, Bld: 123 mg/dL — ABNORMAL HIGH (ref 65–99)
Potassium: 4.1 mmol/L (ref 3.5–5.1)
SODIUM: 139 mmol/L (ref 135–145)

## 2015-05-24 NOTE — Progress Notes (Signed)
Patient's o2 sats 77% on 3LNC, breathing tx given and placed on 55% venturi mask. Patient's 02 now at 93%. Will continue to monitor patient.

## 2015-05-24 NOTE — Progress Notes (Signed)
[facility] PROGRESS NOTE  Sandra Turner ZOX:[id number] DOB: 1940/12/04 DOA: 05/22/2015 PCP: [name], MD   Summary: 74 y.o. female a hx of atrial fibrillation, diastolic CHF, asthma, chronic hypoxic respiratory failure (on 2.5L of 02 at home) presented with SOB and BLE edema. States her diuretic dose was decreased on 8/25. In the ED, CXR revealed bilateral infiltrated consistent with CHF. Labs revealed AKI with BUN 23, Creatinine 1.63, INR of 5.23, and hcg of 8.7. Admitted for further evaluation and management.   Assessment/Plan: 1. Acute on chronic diastolic CHF exacerbation. More sob this am. Volume status -2L. Weight 99.4kg from 100kg yesterday. Continues with LE edema. Continue IV Lasix and monitor.Appreciate cardiology assistance. monitor 2. Acute on chronic hypoxic respiratory failure. On 2.5L O2 at home. Oxygen saturation >90% on 3L. Slightly more sob this am. 3. AKI superimposed on CKD stage III. Creatinine trending down slowly. Likely related to  hypoperfusion of kidneys related to heart failure. Adequate UOP. Continue to trend.  4. Bilateral lower extremity edema, likely related to CHF. Improving slowly.  Continue IV Lasix and monitor. 5. Asthma, stable. Continue nebs 6. Chronic atrial fibrillation, stable, resume Pradaxa.  7. Severe aortic stenosis s/p AVR with bioprosthetic valve May 2016, asymptomatic 8. Tongue lesion. Worrisome of malignancy, recommend outpatient f/u with ENT.   Code Status: full Family Communication: none present Disposition Plan: home when ready   Consultants:  cardiology  Procedures:  none  Antibiotics:  none  HPI/Subjective: Reports not feeling as well today as she did yesterday. More sob.   Objective: Filed Vitals:   05/24/15 0512  BP: 132/52  Pulse: 67  Temp: 98.2 F (36.8 C)  Resp: 18    Intake/Output Summary (Last 24 hours) at 05/24/15 1213 Last data filed at 05/24/15 1100  Gross per 24 hour  Intake    720 ml   Output   1650 ml  Net   -930 ml   Filed Weights   05/22/15 1829 05/22/15 2234 05/24/15 0512  Weight: 87.091 kg (192 lb) 100.517 kg (221 lb 9.6 oz) 99.428 kg (219 lb 3.2 oz)    Exam:   General:  Obese appears slightly anxious  Cardiovascular: RRR +murmur 2+LE edema bilaterally chronic venous stasis changes  Respiratory: mild increased work of breathing with conversation. Fair air flow. Fine crackles bilateral bases  Abdomen: obese soft +BS non-tender   Musculoskeletal: no clubbing or cyanosis   Data Reviewed: Basic Metabolic Panel:  Recent Labs Lab 05/22/15 1905 05/23/15 0708 05/24/15 0628  NA 137 138 139  K 4.5 4.6 4.1  CL 100* 99* 100*  CO2 29 32 32  GLUCOSE 129* 145* 123*  BUN 23* 26* 36*  CREATININE 1.63* 1.63* 1.58*  CALCIUM 8.3* 8.6* 8.7*  MG  --  3.0*  --    Liver Function Tests:  Recent Labs Lab 05/22/15 1905 05/23/15 0708  AST 27 27  ALT 14 17  ALKPHOS 92 93  BILITOT 0.7 0.7  PROT 7.3 7.6  ALBUMIN 3.6 3.6   No results for input(s): LIPASE, AMYLASE in the last 168 hours. No results for input(s): AMMONIA in the last 168 hours. CBC:  Recent Labs Lab 05/22/15 1905 05/23/15 0708  WBC 6.7 5.4  NEUTROABS 5.4  --   HGB 8.7* 8.7*  HCT 27.4* 28.0*  MCV 90.4 90.0  PLT 155 147*   Cardiac Enzymes: No results for input(s): CKTOTAL, CKMB, CKMBINDEX, TROPONINI in the last 168 hours. BNP (last 3 results)  Recent Labs  02/07/15 0520 02/12/15  0454 03/12/15 2156  BNP 204.8* 271.9* 178.0*    ProBNP (last 3 results) No results for input(s): PROBNP in the last 8760 hours.  CBG: No results for input(s): GLUCAP in the last 168 hours.  No results found for this or any previous visit (from the past 240 hour(s)).   Studies: Dg Chest 2 View  05/22/2015   CLINICAL DATA:  Shortness of breath since prior new lung surgery in May of the shear now worsened, chronic diastolic CHF, COPD, atrial fibrillation, post AVR and Maze procedure, pulmonary  hypertension, history pneumonia  EXAM: CHEST  2 VIEW  COMPARISON:  04/07/2015  FINDINGS: Enlargement of cardiac silhouette post median sternotomy, AVR, and LEFT atrial appendage occlusion.  Atherosclerotic calcification aorta.  Bibasilar pleural effusions and atelectasis greater on LEFT.  Diffuse BILATERAL pulmonary infiltrates asymmetrically greater on RIGHT, could represent asymmetric edema or pneumonia, favor edema.  No pneumothorax.  Bones demineralized.  IMPRESSION: Enlargement of cardiac silhouette post AVR with asymmetric infiltrates RIGHT greater than LEFT, bibasilar effusions and atelectasis, favor pulmonary edema over pneumonia.   Electronically Signed   By: Ulyses Southward M.D.   On: 05/22/2015 19:39   US Venous Img Lower Bilateral  05/23/2015   CLINICAL DATA:  74 year old female with bilateral lower extremity edema for the past several months since heart surgery in May  EXAM: BILATERAL LOWER EXTREMITY VENOUS DOPPLER ULTRASOUND  TECHNIQUE: Gray-scale sonography with graded compression, as well as color Doppler and duplex ultrasound were performed to evaluate the lower extremity deep venous systems from the level of the common femoral vein and including the common femoral, femoral, profunda femoral, popliteal and calf veins including the posterior tibial, peroneal and gastrocnemius veins when visible. The superficial great saphenous vein was also interrogated. Spectral Doppler was utilized to evaluate flow at rest and with distal augmentation maneuvers in the common femoral, femoral and popliteal veins.  COMPARISON:  None.  FINDINGS: RIGHT LOWER EXTREMITY  Common Femoral Vein: No evidence of thrombus. Normal compressibility, respiratory phasicity and response to augmentation. Increased pulsatility of the venous waveform.  Saphenofemoral Junction: No evidence of thrombus. Normal compressibility and flow on color Doppler imaging.  Profunda Femoral Vein: No evidence of thrombus. Normal compressibility and flow  on color Doppler imaging.  Femoral Vein: No evidence of thrombus. Normal compressibility, respiratory phasicity and response to augmentation.  Popliteal Vein: No evidence of thrombus. Normal compressibility, respiratory phasicity and response to augmentation.  Calf Veins: No evidence of thrombus. Normal compressibility and flow on color Doppler imaging.  Superficial Great Saphenous Vein: No evidence of thrombus. Normal compressibility and flow on color Doppler imaging.  Venous Reflux:  None.  Other Findings:  Superficial subcutaneous edema  LEFT LOWER EXTREMITY  Common Femoral Vein: No evidence of thrombus. Normal compressibility, respiratory phasicity and response to augmentation. Increased pulsatility of the venous waveform.  Saphenofemoral Junction: No evidence of thrombus. Normal compressibility and flow on color Doppler imaging.  Profunda Femoral Vein: No evidence of thrombus. Normal compressibility and flow on color Doppler imaging.  Femoral Vein: No evidence of thrombus. Normal compressibility, respiratory phasicity and response to augmentation.  Popliteal Vein: No evidence of thrombus. Normal compressibility, respiratory phasicity and response to augmentation.  Calf Veins: No evidence of thrombus. Normal compressibility and flow on color Doppler imaging.  Superficial Great Saphenous Vein: No evidence of thrombus. Normal compressibility and flow on color Doppler imaging.  Venous Reflux:  None.  Other Findings:  Superficial subcutaneous edema.  IMPRESSION: 1. No evidence of deep venous thrombosis in  either lower extremity. 2. Increased pulsatility of the venous waveforms bilaterally. This can be seen in the setting of elevated right heart pressures. Differential considerations include tricuspid regurgitation, right heart dysfunction, pulmonary arterial hypertension and COPD. 3. Superficial subcutaneous edema in the lower extremities from the knees to the ankles.   Electronically Signed   By: Malachy Moan  M.D.   On: 05/23/2015 10:32    Scheduled Meds: . budesonide  0.25 mg Nebulization BID  . dabigatran  150 mg Oral BID  . diltiazem  30 mg Oral BID  . furosemide  80 mg Intravenous BID  . levalbuterol  1.25 mg Nebulization 3 times per day  . magnesium oxide  400 mg Oral BID  . montelukast  10 mg Oral QHS  . 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 If 7PM-7AM, please contact night-coverage at [email or web address], password [facility] 05/24/2015, 12:13 PM  LOS: 1 day     I have directly reviewed the clinical findings, lab results and imaging studies. I have interviewed and examined the patient and agree with the documentation and management as recorded by the Physician extender.  Feeling more sob today than yesterday, less edema, lung improved aeration, no wheezing. Continue diuresing, appreciate cardiology input, patient reported she was intubated in the past for cardiac procedure, but does not wish to be intubated again if respiratory failure not able to improve by conservative management, she has not decided on cpr/defibrillation yet. Will change code status to do not intubate. Will likely be in the hospital a few more days.  Xu,Fang M.D on 05/24/2015 at 2:46 PM  [facility] Office  [phone]

## 2015-05-24 NOTE — Progress Notes (Signed)
Patient ID: Neoma Uhrich, female   DOB: Jan 06, 1941, 74 y.o.   MRN: [id number]    Subjective:    Continued SOB  Objective:   Temp:  [98.1 F (36.7 C)-98.4 F (36.9 C)] 98.2 F (36.8 C) (09/20 0512) Pulse Rate:  [67-78] 67 (09/20 0512) Resp:  [18-20] 18 (09/20 0512) BP: (118-132)/(36-52) 132/52 mmHg (09/20 0512) SpO2:  [92 %-95 %] 92 % (09/20 0826) Weight:  [219 lb 3.2 oz (99.428 kg)] 219 lb 3.2 oz (99.428 kg) (09/20 0512) Last BM Date: 05/23/15  Filed Weights   05/22/15 1829 05/22/15 2234 05/24/15 0512  Weight: 192 lb (87.091 kg) 221 lb 9.6 oz (100.517 kg) 219 lb 3.2 oz (99.428 kg)    Intake/Output Summary (Last 24 hours) at 05/24/15 1304 Last data filed at 05/24/15 1100  Gross per 24 hour  Intake    480 ml  Output   1650 ml  Net  -1170 ml    Telemetry:afib  Exam:  General: NAD  Resp: bilateral wheezing  Cardiac: irreg, 3/6 systolic murmur TR,   GI: abdomen soft, NT, ND  MSK: 1+ bilateral edema  Neuro: no focal deficits  Psych: appropriate affect  Lab Results:  Basic Metabolic Panel:  Recent Labs Lab 05/22/15 1905 05/23/15 0708 05/24/15 0628  NA 137 138 139  K 4.5 4.6 4.1  CL 100* 99* 100*  CO2 29 32 32  GLUCOSE 129* 145* 123*  BUN 23* 26* 36*  CREATININE 1.63* 1.63* 1.58*  CALCIUM 8.3* 8.6* 8.7*  MG  --  3.0*  --     Liver Function Tests:  Recent Labs Lab 05/22/15 1905 05/23/15 0708  AST 27 27  ALT 14 17  ALKPHOS 92 93  BILITOT 0.7 0.7  PROT 7.3 7.6  ALBUMIN 3.6 3.6    CBC:  Recent Labs Lab 05/22/15 1905 05/23/15 0708  WBC 6.7 5.4  HGB 8.7* 8.7*  HCT 27.4* 28.0*  MCV 90.4 90.0  PLT 155 147*    Cardiac Enzymes: No results for input(s): CKTOTAL, CKMB, CKMBINDEX, TROPONINI in the last 168 hours.  BNP: No results for input(s): PROBNP in the last 8760 hours.  Coagulation:  Recent Labs Lab 05/22/15 1905  INR 5.23*    ECG:   Medications:   Scheduled Medications: . budesonide  0.25 mg Nebulization BID  .  dabigatran  150 mg Oral BID  . diltiazem  30 mg Oral BID  . furosemide  80 mg Intravenous BID  . levalbuterol  1.25 mg Nebulization 3 times per day  . magnesium oxide  400 mg Oral BID  . montelukast  10 mg Oral QHS  . potassium chloride  20 mEq Oral Daily     Infusions:     PRN Medications:  albuterol, benzocaine, traMADol     Assessment/Plan    1. Acute on chronic LV diastolic Dysfunction/RV systolic dysfunction - 03/2015 echo 55-60%, cannot eval diastolic function due to afib but severe LAE, moderate to severe RV failure - negative 1 liter yesterday, negative 2.2 liters since admission. She is on lasix  IV bid (increased to  last evening dose). Renal function overall stable with diuresis.  - admit weight 221 lbs, chart review shows very variable weights during clinic visits and admissions.  - continue current diuretics.  2. Afib dilt for rate control, pradaxa for strkoe prevention. Continue current meds   3. Prosthetic AV - - 23 mm [facility] Ease bovine pericardial tissue valve in the AV position, normal function by echo 03/2015  4. COPD - management per primary team   Dina Rich, M.D.

## 2015-05-25 ENCOUNTER — Inpatient Hospital Stay (HOSPITAL_COMMUNITY): Payer: Medicare Other

## 2015-05-25 DIAGNOSIS — I482 Chronic atrial fibrillation: Secondary | ICD-10-CM

## 2015-05-25 DIAGNOSIS — R0600 Dyspnea, unspecified: Secondary | ICD-10-CM

## 2015-05-25 DIAGNOSIS — R0603 Acute respiratory distress: Secondary | ICD-10-CM | POA: Diagnosis not present

## 2015-05-25 DIAGNOSIS — J9621 Acute and chronic respiratory failure with hypoxia: Secondary | ICD-10-CM

## 2015-05-25 LAB — BASIC METABOLIC PANEL
ANION GAP: 8 (ref 5–15)
BUN: 37 mg/dL — ABNORMAL HIGH (ref 6–20)
CALCIUM: 8.8 mg/dL — AB (ref 8.9–10.3)
CO2: 32 mmol/L (ref 22–32)
Chloride: 99 mmol/L — ABNORMAL LOW (ref 101–111)
Creatinine, Ser: 1.45 mg/dL — ABNORMAL HIGH (ref 0.44–1.00)
GFR, EST AFRICAN AMERICAN: 40 mL/min — AB (ref >60)
GFR, EST NON AFRICAN AMERICAN: 35 mL/min — AB (ref >60)
Glucose, Bld: 109 mg/dL — ABNORMAL HIGH (ref 65–99)
Potassium: 3.9 mmol/L (ref 3.5–5.1)
Sodium: 139 mmol/L (ref 135–145)

## 2015-05-25 LAB — CBC
HEMATOCRIT: 23.5 % — AB (ref 36.0–46.0)
Hemoglobin: 7.5 g/dL — ABNORMAL LOW (ref 12.0–15.0)
MCH: 28.6 pg (ref 26.0–34.0)
MCHC: 31.9 g/dL (ref 30.0–36.0)
MCV: 89.7 fL (ref 78.0–100.0)
Platelets: 161 10*3/uL (ref 150–400)
RBC: 2.62 MIL/uL — ABNORMAL LOW (ref 3.87–5.11)
RDW: 16.2 % — AB (ref 11.5–15.5)
WBC: 7.5 10*3/uL (ref 4.0–10.5)

## 2015-05-25 LAB — BLOOD GAS, ARTERIAL
ACID-BASE EXCESS: 10.1 mmol/L — AB (ref 0.0–2.0)
Acid-Base Excess: 6.9 mmol/L — ABNORMAL HIGH (ref 0.0–2.0)
Bicarbonate: 30.5 mEq/L — ABNORMAL HIGH (ref 20.0–24.0)
Bicarbonate: 33.6 mEq/L — ABNORMAL HIGH (ref 20.0–24.0)
DRAWN BY: 234301
DRAWN BY: 25788
Delivery systems: POSITIVE
Expiratory PAP: 8
FIO2: 50
FIO2: 40
Inspiratory PAP: 20
MODE: POSITIVE
O2 SAT: 90.1 %
O2 SAT: 94.7 %
PCO2 ART: 42.4 mmHg (ref 35.0–45.0)
PCO2 ART: 43.7 mmHg (ref 35.0–45.0)
PH ART: 7.501 — AB (ref 7.350–7.450)
PO2 ART: 69.4 mmHg — AB (ref 80.0–100.0)
pH, Arterial: 7.474 — ABNORMAL HIGH (ref 7.350–7.450)
pO2, Arterial: 58.4 mmHg — ABNORMAL LOW (ref 80.0–100.0)

## 2015-05-25 LAB — MRSA PCR SCREENING: MRSA BY PCR: POSITIVE — AB

## 2015-05-25 MED ORDER — DILTIAZEM HCL 60 MG PO TABS
60.0000 mg | ORAL_TABLET | Freq: Three times a day (TID) | ORAL | Status: DC
  Administered 2015-05-25 – 2015-05-30 (×15): 60 mg via ORAL
  Filled 2015-05-25 (×17): qty 1

## 2015-05-25 MED ORDER — FUROSEMIDE 10 MG/ML IJ SOLN
80.0000 mg | Freq: Three times a day (TID) | INTRAMUSCULAR | Status: DC
  Administered 2015-05-25 – 2015-05-26 (×5): 80 mg via INTRAVENOUS
  Filled 2015-05-25 (×5): qty 8

## 2015-05-25 MED ORDER — ALBUTEROL SULFATE (2.5 MG/3ML) 0.083% IN NEBU
3.0000 mL | INHALATION_SOLUTION | RESPIRATORY_TRACT | Status: DC | PRN
  Administered 2015-05-25: 3 mL via RESPIRATORY_TRACT
  Filled 2015-05-25: qty 3

## 2015-05-25 MED ORDER — AZTREONAM 2 G IJ SOLR
2.0000 g | Freq: Three times a day (TID) | INTRAMUSCULAR | Status: DC
  Administered 2015-05-25 – 2015-05-30 (×15): 2 g via INTRAVENOUS
  Filled 2015-05-25 (×21): qty 2

## 2015-05-25 MED ORDER — CHLORHEXIDINE GLUCONATE CLOTH 2 % EX PADS
6.0000 | MEDICATED_PAD | Freq: Every day | CUTANEOUS | Status: AC
  Administered 2015-05-26 – 2015-05-30 (×5): 6 via TOPICAL

## 2015-05-25 MED ORDER — LEVALBUTEROL HCL 1.25 MG/0.5ML IN NEBU
INHALATION_SOLUTION | RESPIRATORY_TRACT | Status: AC
  Filled 2015-05-25: qty 0.5

## 2015-05-25 MED ORDER — MUPIROCIN 2 % EX OINT
1.0000 "application " | TOPICAL_OINTMENT | Freq: Two times a day (BID) | CUTANEOUS | Status: AC
  Administered 2015-05-25 – 2015-05-30 (×10): 1 via NASAL
  Filled 2015-05-25 (×2): qty 22

## 2015-05-25 MED ORDER — LEVALBUTEROL HCL 1.25 MG/0.5ML IN NEBU
1.2500 mg | INHALATION_SOLUTION | RESPIRATORY_TRACT | Status: DC
  Administered 2015-05-25 – 2015-05-29 (×25): 1.25 mg via RESPIRATORY_TRACT
  Filled 2015-05-25 (×24): qty 0.5

## 2015-05-25 MED ORDER — LORAZEPAM 1 MG PO TABS
1.0000 mg | ORAL_TABLET | Freq: Once | ORAL | Status: AC
Start: 2015-05-25 — End: 2015-05-25
  Administered 2015-05-25: 1 mg via ORAL
  Filled 2015-05-25: qty 1

## 2015-05-25 MED ORDER — VANCOMYCIN HCL 10 G IV SOLR
2000.0000 mg | Freq: Once | INTRAVENOUS | Status: AC
  Administered 2015-05-25: 2000 mg via INTRAVENOUS
  Filled 2015-05-25: qty 500

## 2015-05-25 MED ORDER — METHYLPREDNISOLONE SODIUM SUCC 40 MG IJ SOLR
40.0000 mg | Freq: Four times a day (QID) | INTRAMUSCULAR | Status: DC
  Administered 2015-05-25 – 2015-05-30 (×21): 40 mg via INTRAVENOUS
  Filled 2015-05-25 (×23): qty 1

## 2015-05-25 MED ORDER — VANCOMYCIN HCL 10 G IV SOLR
1250.0000 mg | INTRAVENOUS | Status: DC
  Administered 2015-05-26: 1250 mg via INTRAVENOUS
  Filled 2015-05-25 (×3): qty 1250

## 2015-05-25 NOTE — Progress Notes (Signed)
Pt transferred to ICU on bipap Respiratory therapist accompanied pt on transfer

## 2015-05-25 NOTE — Progress Notes (Signed)
[facility] PROGRESS NOTE  Sandra Turner JWJ:[id number] DOB: 1940-10-27 DOA: 05/22/2015 PCP: [name], MD  Assessment/Plan: 1. Acute on chronic diastolic CHF exacerbation. SOB increasing. Continues to have LE edema. Continue IV Lasix and monitor strict I&Os. Appreciate cardiology input.   2. Acute on chronic hypoxic respiratory failure. Breathing is worsening. On 2.5L O2 at home. Oxygen saturation <90% on 3L with no improvement after 4L. Placed on venturi mask with improvement in saturations. Repeat CXR and move to SDU for further evaluation. Will request pulmonology input. Will give a trial of BiPAP and check ABG.  3. AKI superimposed on CKD stage III. Creatinine slowly improving. Likely related to hypoperfusion of kidneys related to heart failure. Adequate UOP. Will continue to trend.  4. Bilateral lower extremity edema, likely related to CHF. Remains stable. Continue IV Lasix and monitor. 5. Hyperglycemia. Glucose mildly elevated, 109. Continue to follow.  6. Asthma, stable. Continue nebs. 7. Chronic atrial fibrillation, stable, Will hold Pradaxa in the setting of hemoptysis. 8. Severe aortic stenosis s/p AVR with bioprosthetic valve May 2016, asymptomatic 9. Tongue lesion. Worrisome of malignancy, recommend outpatient f/u with ENT. 10. Hemoptysis/epistasis. Patient noted to be coughing up blood this morning. There has also been reported nose bleeds. Will hold Pradaxa.   Code Status:Full DVT prophylaxis: SCDs Family Communication: Discussed with patient who understands and has no concerns at this time. Disposition Plan: Transfer to SDU  Consultants:  Cardiology-Jonathan Margy Clarks, MD  Procedures:    Antibiotics:    HPI/Subjective: SOB is increasing with a mild productive cough (blood). Denies CP. Also reports blood coming from nose.   Objective: Filed Vitals:   05/25/15 0547  BP:   Pulse: 99  Temp:   Resp:     Intake/Output Summary (Last 24 hours) at 05/25/15  0812 Last data filed at 05/25/15 0400  Gross per 24 hour  Intake    720 ml  Output   1700 ml  Net   -980 ml   Filed Weights   05/22/15 2234 05/24/15 0512 05/25/15 0457  Weight: 100.517 kg (221 lb 9.6 oz) 99.428 kg (219 lb 3.2 oz) 97.16 kg (214 lb 3.2 oz)    Exam: General:tachypneic in respiratory distress, looks tired. Sitting up in bed with venturi mask in place. Cardiovascular: Irregular rhythm, regular rate  Respiratory: clear bilaterally, No wheezing, rales or rhnochi Abdomen: soft, non tender, no distention , bowel sounds normal Musculoskeletal: 2+ edema b/l  Data Reviewed: Basic Metabolic Panel:  Recent Labs Lab 05/22/15 1905 05/23/15 0708 05/24/15 0628 05/25/15 0639  NA 137 138 139 139  K 4.5 4.6 4.1 3.9  CL 100* 99* 100* 99*  CO2 29 32 32 32  GLUCOSE 129* 145* 123* 109*  BUN 23* 26* 36* 37*  CREATININE 1.63* 1.63* 1.58* 1.45*  CALCIUM 8.3* 8.6* 8.7* 8.8*  MG  --  3.0*  --   --    Liver Function Tests:  Recent Labs Lab 05/22/15 1905 05/23/15 0708  AST 27 27  ALT 14 17  ALKPHOS 92 93  BILITOT 0.7 0.7  PROT 7.3 7.6  ALBUMIN 3.6 3.6  CBC:  Recent Labs Lab 05/22/15 1905 05/23/15 0708  WBC 6.7 5.4  NEUTROABS 5.4  --   HGB 8.7* 8.7*  HCT 27.4* 28.0*  MCV 90.4 90.0  PLT 155 147*     Recent Labs  02/07/15 0520 02/12/15 0435 03/12/15 2156  BNP 204.8* 271.9* 178.0*    Studies: US Venous Img Lower Bilateral  05/23/2015   CLINICAL  DATA:  74 year old female with bilateral lower extremity edema for the past several months since heart surgery in May  EXAM: BILATERAL LOWER EXTREMITY VENOUS DOPPLER ULTRASOUND  TECHNIQUE: Gray-scale sonography with graded compression, as well as color Doppler and duplex ultrasound were performed to evaluate the lower extremity deep venous systems from the level of the common femoral vein and including the common femoral, femoral, profunda femoral, popliteal and calf veins including the posterior tibial, peroneal and  gastrocnemius veins when visible. The superficial great saphenous vein was also interrogated. Spectral Doppler was utilized to evaluate flow at rest and with distal augmentation maneuvers in the common femoral, femoral and popliteal veins.  COMPARISON:  None.  FINDINGS: RIGHT LOWER EXTREMITY  Common Femoral Vein: No evidence of thrombus. Normal compressibility, respiratory phasicity and response to augmentation. Increased pulsatility of the venous waveform.  Saphenofemoral Junction: No evidence of thrombus. Normal compressibility and flow on color Doppler imaging.  Profunda Femoral Vein: No evidence of thrombus. Normal compressibility and flow on color Doppler imaging.  Femoral Vein: No evidence of thrombus. Normal compressibility, respiratory phasicity and response to augmentation.  Popliteal Vein: No evidence of thrombus. Normal compressibility, respiratory phasicity and response to augmentation.  Calf Veins: No evidence of thrombus. Normal compressibility and flow on color Doppler imaging.  Superficial Great Saphenous Vein: No evidence of thrombus. Normal compressibility and flow on color Doppler imaging.  Venous Reflux:  None.  Other Findings:  Superficial subcutaneous edema  LEFT LOWER EXTREMITY  Common Femoral Vein: No evidence of thrombus. Normal compressibility, respiratory phasicity and response to augmentation. Increased pulsatility of the venous waveform.  Saphenofemoral Junction: No evidence of thrombus. Normal compressibility and flow on color Doppler imaging.  Profunda Femoral Vein: No evidence of thrombus. Normal compressibility and flow on color Doppler imaging.  Femoral Vein: No evidence of thrombus. Normal compressibility, respiratory phasicity and response to augmentation.  Popliteal Vein: No evidence of thrombus. Normal compressibility, respiratory phasicity and response to augmentation.  Calf Veins: No evidence of thrombus. Normal compressibility and flow on color Doppler imaging.  Superficial  Great Saphenous Vein: No evidence of thrombus. Normal compressibility and flow on color Doppler imaging.  Venous Reflux:  None.  Other Findings:  Superficial subcutaneous edema.  IMPRESSION: 1. No evidence of deep venous thrombosis in either lower extremity. 2. Increased pulsatility of the venous waveforms bilaterally. This can be seen in the setting of elevated right heart pressures. Differential considerations include tricuspid regurgitation, right heart dysfunction, pulmonary arterial hypertension and COPD. 3. Superficial subcutaneous edema in the lower extremities from the knees to the ankles.   Electronically Signed   By: Malachy Moan M.D.   On: 05/23/2015 10:32    Scheduled Meds: . budesonide  0.25 mg Nebulization BID  . dabigatran  150 mg Oral BID  . diltiazem  30 mg Oral BID  . furosemide  80 mg Intravenous BID  . levalbuterol  1.25 mg Nebulization Q4H  . magnesium oxide  400 mg Oral BID  . montelukast  10 mg Oral QHS  . 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 If 7PM-7AM, please contact night-coverage at [email or web address], password [facility] 05/25/2015, 8:12 AM  LOS: 2 days     By signing my name below, I, Zadie Cleverly, attest that this documentation has been prepared under the direction and in the presence of Erick Blinks, MD. Electronically signed: Zadie Cleverly, Scribe. 05/25/2015    I, Dr. Erick Blinks, personally performed the services described in this documentaiton. All medical record entries made by  the scribe were at my direction and in my presence. I have reviewed the chart and agree that the record reflects my personal performance and is accurate and complete  Erick Blinks, MD, 05/25/2015 5:53 PM

## 2015-05-25 NOTE — Progress Notes (Signed)
At 0934hr RT placed patient on BIPAP 20/8 40% FIO2. Patient with increased WOB RR 31, HR 103 and SATs 91%. Patient appears to be tolerating BIPAP well, RT will remain at bedside until patient is transported to ICU. RT will continue to monitor and assess

## 2015-05-25 NOTE — Progress Notes (Signed)
Dr. Juanetta Gosling notified of ABG results and changes made per RT to Bipap. No new orders at this time.

## 2015-05-25 NOTE — Consult Note (Signed)
Consult requested by: [facility] Consult requested for respiratory failure:  HPI: This is a 74 year old who came into the hospital with acute on chronic diastolic heart failure. She had been doing well and had been diuresing but this morning she was found to have acute respiratory distress. She says that she didn't aspirate. She's coughing up some sputum which is bloody mixed with green phlegm. She says she does have COPD at baseline. She has been on mechanical ventilation in the past. She is chronically anticoagulated she is in chronic atrial fibrillation and Pradaxa is being held  Past Medical History  Diagnosis Date  . Arthritis   . Chronic atrial fibrillation     a. previously on Pradaxa - on Coumadin since valve surgery.  . Aortic stenosis     a. Severe - s/p pericardial AVR with MAZE 02/04/15.  Marland Kitchen Pneumonia 09/2014  . History of bronchitis 2015  . Joint pain   . Back pain     reason unknown  . Family history of adverse reaction to anesthesia     uncle with MH in the 60's,cousin in the 58's with MH  . 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 COPD (chronic obstructive pulmonary disease)   . Normal coronary arteries     a. By cath 11/2014.  . Pulmonary hypertension     a. Mod by cath 11/2014.  Marland Kitchen Chronic diastolic CHF (congestive heart failure)   . Vocal cord paralysis     a. Dx post-AVR in 01/2015. Required PANDA, intubation during that admission.  . Pleural effusion   . AKI (acute kidney injury)     a. H/o AKI after AVR in 01/2015.  . Tracheomalacia   . Anemia     a. 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     Family History  Problem Relation Age  of Onset  . Breast cancer Mother   . Congestive Heart Failure Mother   . Cancer Father     ? Lymphoma  . Malignant hyperthermia Cousin      Social History   Social History  . Marital Status: Divorced    Spouse Name: N/A  . Number of Children: 0  . Years of Education: N/A   Occupational History  . Hairdresser    Social History Main Topics  . Smoking status: Never Smoker   . Smokeless tobacco: Never Used  . Alcohol Use: No  . Drug Use: No  . Sexual Activity: No   Other Topics Concern  . None   Social History Narrative     ROS: She has had hemoptysis. She denies any chest pain. She's not had any other new symptoms. She denies fever or chills. No symptoms of aspiration    Objective: Vital signs in last 24 hours: Temp:  [98.4 F (36.9 C)-100 F (37.8 C)] 98.4 F (36.9 C) (09/21 0457) Pulse Rate:  [79-109] 99 (09/21 0547) Resp:  [18-22] 22 (09/21 0457) BP: (137-168)/(59-80) 168/80 mmHg (09/21 0457) SpO2:  [81 %-97 %] 93 % (09/21 0626) FiO2 (%):  [50 %-55 %] 50 % (09/21 0626) Weight:  [97.16 kg (214 lb 3.2 oz)] 97.16 kg (214 lb 3.2 oz) (09/21 0457) Weight change: -2.268  kg (-5 lb) Last BM Date: 05/23/15  Intake/Output from previous day: 09/20 0701 - 09/21 0700 In: 720 [P.O.:720] Out: 1700 [Urine:1700]  PHYSICAL EXAM She looks to be in acute respiratory distress. She has mask oxygen. She is breathing about 35 times a minute. Her pupils react to some throat are clear mucous membranes are moist her neck is supple and I can't tell if she has any JVD in an upright position. Her chest shows wheezes but no rales. Her heart is irregularly irregular with a systolic heart murmur. Her abdomen is soft without masses. She has significant edema of both lower extremities the left more than the right. Central nervous system exam shows that she is anxious but otherwise intact  Lab Results: Basic Metabolic Panel:  Recent Labs  19/14/78 0708 05/24/15 0628 05/25/15 0639  NA  138 139 139  K 4.6 4.1 3.9  CL 99* 100* 99*  CO2 32 32 32  GLUCOSE 145* 123* 109*  BUN 26* 36* 37*  CREATININE 1.63* 1.58* 1.45*  CALCIUM 8.6* 8.7* 8.8*  MG 3.0*  --   --    Liver Function Tests:  Recent Labs  05/22/15 1905 05/23/15 0708  AST 27 27  ALT 14 17  ALKPHOS 92 93  BILITOT 0.7 0.7  PROT 7.3 7.6  ALBUMIN 3.6 3.6   No results for input(s): LIPASE, AMYLASE in the last 72 hours. No results for input(s): AMMONIA in the last 72 hours. CBC:  Recent Labs  05/22/15 1905 05/23/15 0708  WBC 6.7 5.4  NEUTROABS 5.4  --   HGB 8.7* 8.7*  HCT 27.4* 28.0*  MCV 90.4 90.0  PLT 155 147*   Cardiac Enzymes: No results for input(s): CKTOTAL, CKMB, CKMBINDEX, TROPONINI in the last 72 hours. BNP: No results for input(s): PROBNP in the last 72 hours. D-Dimer: No results for input(s): DDIMER in the last 72 hours. CBG: No results for input(s): GLUCAP in the last 72 hours. Hemoglobin A1C: No results for input(s): HGBA1C in the last 72 hours. Fasting Lipid Panel: No results for input(s): CHOL, HDL, LDLCALC, TRIG, CHOLHDL, LDLDIRECT in the last 72 hours. Thyroid Function Tests: No results for input(s): TSH, T4TOTAL, FREET4, T3FREE, THYROIDAB in the last 72 hours. Anemia Panel: No results for input(s): VITAMINB12, FOLATE, FERRITIN, TIBC, IRON, RETICCTPCT in the last 72 hours. Coagulation:  Recent Labs  05/22/15 1905  LABPROT 46.5*  INR 5.23*   Urine Drug Screen: Drugs of Abuse  No results found for: LABOPIA, COCAINSCRNUR, LABBENZ, AMPHETMU, THCU, LABBARB  Alcohol Level: No results for input(s): ETH in the last 72 hours. Urinalysis: No results for input(s): COLORURINE, LABSPEC, PHURINE, GLUCOSEU, HGBUR, BILIRUBINUR, KETONESUR, PROTEINUR, UROBILINOGEN, NITRITE, LEUKOCYTESUR in the last 72 hours.  Invalid input(s): APPERANCEUR Misc. Labs:   ABGS: No results for input(s): PHART, PO2ART, TCO2, HCO3 in the last 72 hours.  Invalid input(s): PCO2   MICROBIOLOGY: No  results found for this or any previous visit (from the past 240 hour(s)).  Studies/Results: US Venous Img Lower Bilateral  05/23/2015   CLINICAL DATA:  74 year old female with bilateral lower extremity edema for the past several months since heart surgery in May  EXAM: BILATERAL LOWER EXTREMITY VENOUS DOPPLER ULTRASOUND  TECHNIQUE: Gray-scale sonography with graded compression, as well as color Doppler and duplex ultrasound were performed to evaluate the lower extremity deep venous systems from the level of the common femoral vein and including the common femoral, femoral, profunda femoral, popliteal and calf veins including the posterior tibial, peroneal and gastrocnemius  veins when visible. The superficial great saphenous vein was also interrogated. Spectral Doppler was utilized to evaluate flow at rest and with distal augmentation maneuvers in the common femoral, femoral and popliteal veins.  COMPARISON:  None.  FINDINGS: RIGHT LOWER EXTREMITY  Common Femoral Vein: No evidence of thrombus. Normal compressibility, respiratory phasicity and response to augmentation. Increased pulsatility of the venous waveform.  Saphenofemoral Junction: No evidence of thrombus. Normal compressibility and flow on color Doppler imaging.  Profunda Femoral Vein: No evidence of thrombus. Normal compressibility and flow on color Doppler imaging.  Femoral Vein: No evidence of thrombus. Normal compressibility, respiratory phasicity and response to augmentation.  Popliteal Vein: No evidence of thrombus. Normal compressibility, respiratory phasicity and response to augmentation.  Calf Veins: No evidence of thrombus. Normal compressibility and flow on color Doppler imaging.  Superficial Great Saphenous Vein: No evidence of thrombus. Normal compressibility and flow on color Doppler imaging.  Venous Reflux:  None.  Other Findings:  Superficial subcutaneous edema  LEFT LOWER EXTREMITY  Common Femoral Vein: No evidence of thrombus. Normal  compressibility, respiratory phasicity and response to augmentation. Increased pulsatility of the venous waveform.  Saphenofemoral Junction: No evidence of thrombus. Normal compressibility and flow on color Doppler imaging.  Profunda Femoral Vein: No evidence of thrombus. Normal compressibility and flow on color Doppler imaging.  Femoral Vein: No evidence of thrombus. Normal compressibility, respiratory phasicity and response to augmentation.  Popliteal Vein: No evidence of thrombus. Normal compressibility, respiratory phasicity and response to augmentation.  Calf Veins: No evidence of thrombus. Normal compressibility and flow on color Doppler imaging.  Superficial Great Saphenous Vein: No evidence of thrombus. Normal compressibility and flow on color Doppler imaging.  Venous Reflux:  None.  Other Findings:  Superficial subcutaneous edema.  IMPRESSION: 1. No evidence of deep venous thrombosis in either lower extremity. 2. Increased pulsatility of the venous waveforms bilaterally. This can be seen in the setting of elevated right heart pressures. Differential considerations include tricuspid regurgitation, right heart dysfunction, pulmonary arterial hypertension and COPD. 3. Superficial subcutaneous edema in the lower extremities from the knees to the ankles.   Electronically Signed   By: Malachy Moan M.D.   On: 05/23/2015 10:32    Medications:  Prior to Admission:  Prescriptions prior to admission  Medication Sig Dispense Refill Last Dose  . albuterol (PROAIR HFA) 108 (90 BASE) MCG/ACT inhaler Inhale 2 puffs into the lungs 4 (four) times daily as needed for wheezing or shortness of breath.   05/22/2015 at Unknown time  . albuterol (PROVENTIL) (2.5 MG/3ML) 0.083% nebulizer solution Take 3 mLs (2.5 mg total) by nebulization every 4 (four) hours as needed for wheezing or shortness of breath. 75 mL 12 05/22/2015 at Unknown time  . amiodarone (PACERONE) 200 MG tablet Take 200 mg by mouth daily.   05/22/2015 at  Unknown time  . budesonide (PULMICORT) 0.25 MG/2ML nebulizer solution Take 2 mLs (0.25 mg total) by nebulization 2 (two) times daily. 60 mL 11 05/22/2015 at Unknown time  . dabigatran (PRADAXA) 150 MG CAPS capsule Take 1 capsule (150 mg total) by mouth 2 (two) times daily. 60 capsule 6 05/22/2015 at Unknown time  . diltiazem (CARDIZEM) 30 MG tablet Take 1 tablet (30 mg total) by mouth 2 (two) times daily. 60 tablet 2 05/22/2015 at Unknown time  . Fluticasone Propionate, Inhal, (FLOVENT DISKUS) 250 MCG/BLIST AEPB Inhale 1 puff into the lungs 2 (two) times daily. 60 each 3 Past Week at Unknown time  . magnesium oxide (MAG-OX)  400 MG tablet Take 400 mg by mouth 2 (two) times daily.   05/22/2015 at Unknown time  . potassium chloride (K-DUR) 10 MEQ tablet Take 2 tablets (20 mEq total) by mouth daily. 60 tablet 6 05/22/2015 at Unknown time  . torsemide (DEMADEX) 20 MG tablet Take 2 tablets (40 mg total) by mouth AC breakfast. (Patient taking differently: Take 20 mg by mouth 2 (two) times daily. ) 90 tablet 3 05/22/2015 at Unknown time  . traMADol (ULTRAM) 50 MG tablet Take 1 tablet (50 mg total) by mouth every 6 (six) hours as needed (pain). 50 tablet 0 05/22/2015 at Unknown time  . docusate sodium (COLACE) 100 MG capsule Take 1 capsule (100 mg total) by mouth daily as needed for mild constipation. (Patient not taking: Reported on 05/22/2015) 30 capsule 1   . torsemide (DEMADEX) 20 MG tablet Take 1 tablet (20 mg total) by mouth daily after supper. (Patient not taking: Reported on 05/22/2015) 30 tablet 0 Taking   Scheduled: . budesonide  0.25 mg Nebulization BID  . dabigatran  150 mg Oral BID  . diltiazem  60 mg Oral 3 times per day  . furosemide  80 mg Intravenous BID  . levalbuterol  1.25 mg Nebulization Q4H  . magnesium oxide  400 mg Oral BID  . montelukast  10 mg Oral QHS  . potassium chloride  20 mEq Oral Daily   Continuous:  WGN:[id number], benzocaine, traMADol  Assesment: She was admitted with acute  on chronic diastolic heart failure and is known to have chronic hypoxic respiratory failure. She is now having acute respiratory distress and I don't think we know for sure what's happened. Diagnostic studies are pending. I think it's appropriate to move her to stepdown and place her on BiPAP but I'm concerned that that may not be enough and I think there is an approximately 50% chance that she is going to need to be intubated and placed on mechanical ventilation and I told her that. 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 Check chest x-ray to see if she's developed pneumonia. Place her on BiPAP. Continue diuresis. Her chest x-ray shows significant changes and I'm going to go ahead and treat her for pneumonia as well as her heart failure.    LOS: 2 days   HAWKINS,EDWARD L 05/25/2015, 9:17 AM

## 2015-05-25 NOTE — Progress Notes (Signed)
ANTIBIOTIC CONSULT NOTE - INITIAL  Pharmacy Consult for Vancomycin and Aztreonam Indication: pneumonia  Allergies  Allergen Reactions  . Bee Venom Swelling  . Latex Rash  . Penicillins Rash    Patient Measurements: Height:  (157.5 cm) Weight: 214 lb 3.2 oz (97.16 kg) IBW/kg (Calculated) : 50.1 Adjusted Body Weight: 68.9kg Vital Signs: Temp: 98.4 F (36.9 C) (09/21 0457) Temp Source: Axillary (09/21 0457) BP: 168/80 mmHg (09/21 0457) Pulse Rate: 103 (09/21 0934) Intake/Output from previous day: 09/20 0701 - 09/21 0700 In: 720 [P.O.:720] Out: 1700 [Urine:1700] Intake/Output from this shift: Total I/O In: 120 [P.O.:120] Out: -   Labs:  Recent Labs  05/22/15 1905 05/23/15 0708 05/24/15 0628 05/25/15 0639  WBC 6.7 5.4  --   --   HGB 8.7* 8.7*  --   --   PLT 155 147*  --   --   CREATININE 1.63* 1.63* 1.58* 1.45*   Estimated Creatinine Clearance: 37.6 mL/min (by C-G formula based on Cr of 1.45). No results for input(s): VANCOTROUGH, VANCOPEAK, VANCORANDOM, GENTTROUGH, GENTPEAK, GENTRANDOM, TOBRATROUGH, TOBRAPEAK, TOBRARND, AMIKACINPEAK, AMIKACINTROU, AMIKACIN in the last 72 hours.   Microbiology: No results found for this or any previous visit (from the past 720 hour(s)).  Medical History: Past Medical History  Diagnosis Date  . Arthritis   . Chronic atrial fibrillation     a. previously on Pradaxa - on Coumadin since valve surgery.  . Aortic stenosis     a. Severe - s/p pericardial AVR with MAZE 02/04/15.  Marland Kitchen Pneumonia 09/2014  . History of bronchitis 2015  . Joint pain   . Back pain     reason unknown  . Family history of adverse reaction to anesthesia     uncle with MH in the 60's,cousin in the 53's with MH  . 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 Ease bovine pericardial tissue valve with bovine pericardial patch enlargement of the aortic root  . COPD (chronic obstructive pulmonary disease)   . Normal coronary arteries     a. By cath 11/2014.  . Pulmonary hypertension     a. Mod by cath 11/2014.  Marland Kitchen Chronic diastolic CHF (congestive heart failure)   . Vocal cord paralysis     a. Dx post-AVR in 01/2015. Required PANDA, intubation during that admission.  . Pleural effusion   . AKI (acute kidney injury)     a. H/o AKI after AVR in 01/2015.  . Tracheomalacia   . Anemia     a. 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    Medications:  Scheduled:  . aztreonam  2 g Intravenous 3 times per day  . budesonide  0.25 mg Nebulization BID  . dabigatran  150 mg Oral BID  . diltiazem  60 mg Oral 3 times per day  . furosemide  80 mg Intravenous BID  . levalbuterol  1.25 mg Nebulization Q4H  . magnesium oxide  400 mg Oral BID  . methylPREDNISolone (SOLU-MEDROL) injection  40 mg Intravenous Q6H  . montelukast  10 mg Oral QHS  . potassium chloride  20 mEq Oral Daily  . vancomycin  2,000 mg Intravenous Once   Followed by  . [START ON 05/26/2015] vancomycin  1,250 mg Intravenous Q24H   Assessment: 74 yo female in  acute respiratory distress, also with acute on chronic HF with possible PNA. Empiric broad spectrum abx ordered:  Vancomycin and Aztreonam.  Afebrile, tachy. Cxray shows Extensive bilateral infiltrates diffusely worse particularly right upper lobe.  Goal of Therapy:  Vancomycin trough level 15-20 mcg/ml  Plan:  Vancomycin 2gm loading dose then  iv q24h Aztreonam 2 gm IV q8h Measure antibiotic drug levels at steady state Follow up culture results  Thanks for allowing me to assist in care of this patient. Elder Cyphers, BS Pharm D, BCPS Clinical Pharmacist Pager [phone]  05/25/2015,10:42 AM

## 2015-05-25 NOTE — Progress Notes (Signed)
Subjective:  Breathing worse this am. Receiving breathing treatment but really struggling. Dr. Garnette Czech and Memon in the room.  Objective:  Vital Signs in the last 24 hours: Temp:  [98.4 F (36.9 C)-100 F (37.8 C)] 98.4 F (36.9 C) (09/21 0457) Pulse Rate:  [79-109] 99 (09/21 0547) Resp:  [18-22] 22 (09/21 0457) BP: (137-168)/(59-80) 168/80 mmHg (09/21 0457) SpO2:  [81 %-97 %] 93 % (09/21 0626) FiO2 (%):  [50 %-55 %] 50 % (09/21 0626) Weight:  [214 lb 3.2 oz (97.16 kg)] 214 lb 3.2 oz (97.16 kg) (09/21 0457)  Intake/Output from previous day: 09/20 0701 - 09/21 0700 In: 720 [P.O.:720] Out: 1700 [Urine:1700] Intake/Output from this shift:    Physical Exam: NECK: Without JVD, HJR, or bruit LUNGS: Decreased breath sounds throughout with  anterior, posterior, lateral wheezing HEART: Irregular rate and rhythm, no murmur, gallop, rub, bruit, thrill, or heave EXTREMITIES: plus 2-3 edema bilaterally    Lab Results:  Recent Labs  05/22/15 1905 05/23/15 0708  WBC 6.7 5.4  HGB 8.7* 8.7*  PLT 155 147*    Recent Labs  05/24/15 0628 05/25/15 0639  NA 139 139  K 4.1 3.9  CL 100* 99*  CO2 32 32  GLUCOSE 123* 109*  BUN 36* 37*  CREATININE 1.58* 1.45*   No results for input(s): TROPONINI in the last 72 hours.  Invalid input(s): CK, MB Hepatic Function Panel  Recent Labs  05/23/15 0708  PROT 7.6  ALBUMIN 3.6  AST 27  ALT 17  ALKPHOS 93  BILITOT 0.7   No results for input(s): CHOL in the last 72 hours. No results for input(s): PROTIME in the last 72 hours.    Cardiac Studies:  Assessment/Plan:  1. Acute on chronic LV diastolic Dysfunction/RV systolic dysfunction - 03/2015 echo 55-60%, cannot eval diastolic function due to afib but severe LAE, moderate to severe RV failure - negative ~1 liter yesterday, negative 3 liters since admission. She is on lasix  IV bid (increased to  last evening dose). Renal function overall stable with diuresis.   - admit  weight 221 lbs, Weight today 214-down 5 lbs from yesterday.chart review shows very variable weights during clinic visits and admissions.   - continue current diuretics.  2. Afib rate up to 107-120 with breathing issues. On Cardizem 30 mg BID. Was on Amiodarone until 04/28/15 and stopped due to skin peeling. Will increase cardizem to 60 mg TID BP up as well. pradaxa for stroke prevention. Continue current meds   3. Prosthetic AV - - 23 mm [facility] Ease bovine pericardial tissue valve in the AV position, normal function by echo 03/2015  4. COPD worsening symptoms today. Pulmonary in room     LOS: 2 days    Jacolyn Reedy 05/25/2015, 8:39 AM   Patient seen and discussed with PA Geni Bers, I agree with her documentation. Regarding her acute on chronic LV diastolic/RV systolic HF she is nevative 1 liter yesterday, negative 2.8 liters since admission. Cr trending down consistent with venous congestion and CHF. She is on lasix  bid. Would like to have her net negative close to 2 liters per day, will increase lasix to  tid for today. CXR this AM shows extensive bilateral infiltrates worse since admission, particularly RUL. Unlikely to be edema given she is net negative since admission and how it seems to significantly affect the upper lobes. ABG 7.47/58/42. More aggressive diuresis as described above, foley placed approx 1 hour ago with already out. Pulmonary is starting  her on Abx with plans for transfer to stepdown for bipap. For afib continued on ditltiazem, will titrate up dose however some tachcyardia given her respiratory issues is expected.     Dominga Ferry MD

## 2015-05-26 ENCOUNTER — Inpatient Hospital Stay (HOSPITAL_COMMUNITY): Payer: Medicare Other

## 2015-05-26 DIAGNOSIS — J181 Lobar pneumonia, unspecified organism: Secondary | ICD-10-CM | POA: Diagnosis not present

## 2015-05-26 DIAGNOSIS — E876 Hypokalemia: Secondary | ICD-10-CM | POA: Diagnosis present

## 2015-05-26 DIAGNOSIS — D649 Anemia, unspecified: Secondary | ICD-10-CM | POA: Diagnosis present

## 2015-05-26 LAB — BASIC METABOLIC PANEL
ANION GAP: 10 (ref 5–15)
BUN: 35 mg/dL — ABNORMAL HIGH (ref 6–20)
CO2: 35 mmol/L — ABNORMAL HIGH (ref 22–32)
Calcium: 8.3 mg/dL — ABNORMAL LOW (ref 8.9–10.3)
Chloride: 98 mmol/L — ABNORMAL LOW (ref 101–111)
Creatinine, Ser: 1.39 mg/dL — ABNORMAL HIGH (ref 0.44–1.00)
GFR, EST AFRICAN AMERICAN: 42 mL/min — AB (ref >60)
GFR, EST NON AFRICAN AMERICAN: 37 mL/min — AB (ref >60)
Glucose, Bld: 122 mg/dL — ABNORMAL HIGH (ref 65–99)
POTASSIUM: 3.3 mmol/L — AB (ref 3.5–5.1)
SODIUM: 143 mmol/L (ref 135–145)

## 2015-05-26 LAB — BLOOD GAS, ARTERIAL
Acid-Base Excess: 11.3 mmol/L — ABNORMAL HIGH (ref 0.0–2.0)
Bicarbonate: 34.8 mEq/L — ABNORMAL HIGH (ref 20.0–24.0)
DRAWN BY: 213101
Delivery systems: POSITIVE
Expiratory PAP: 8
FIO2: 35
Inspiratory PAP: 12
PCO2 ART: 46.2 mmHg — AB (ref 35.0–45.0)
PO2 ART: 66.1 mmHg — AB (ref 80.0–100.0)
TCO2: 10 mmol/L (ref 0–100)
pH, Arterial: 7.495 — ABNORMAL HIGH (ref 7.350–7.450)

## 2015-05-26 LAB — CBC
HCT: 24.5 % — ABNORMAL LOW (ref 36.0–46.0)
Hemoglobin: 7.7 g/dL — ABNORMAL LOW (ref 12.0–15.0)
MCH: 28.3 pg (ref 26.0–34.0)
MCHC: 31.4 g/dL (ref 30.0–36.0)
MCV: 90.1 fL (ref 78.0–100.0)
PLATELETS: 157 10*3/uL (ref 150–400)
RBC: 2.72 MIL/uL — AB (ref 3.87–5.11)
RDW: 15.8 % — ABNORMAL HIGH (ref 11.5–15.5)
WBC: 4.1 10*3/uL (ref 4.0–10.5)

## 2015-05-26 MED ORDER — BUDESONIDE 0.25 MG/2ML IN SUSP
RESPIRATORY_TRACT | Status: AC
  Filled 2015-05-26: qty 2

## 2015-05-26 NOTE — Progress Notes (Addendum)
[facility] PROGRESS NOTE  Sandra Turner EAV:[id number] DOB: 08-06-1941 DOA: 05/22/2015 PCP: [name], MD  Assessment/Plan: 1. Acute on chronic diastolic CHF exacerbation. Currently on IV Lasix with good urine output. Cardiology is following. Clinically she appears to be improving. SOB increasing. Continues to have LE edema. Will continue IV Lasix and monitor strict I&Os. Appreciate cardiology input.  Renal function is stable. Continue current treatments.  2. Acute on chronic hypoxic respiratory failure on 2.5L at home, related to CHF exacerbation/PNA. Patient's breathing had decompensated requiring BiPAP. Clinically now she is improving and is back on nasal canula. Continue to wean off O2 as tolerated. 3. Lobar Pneumonia. Patient found to have infiltrates in right upper lobe. She has been started on broad spectrum abx. 4. AKI superimposed on CKD stage III. Creatinine continues to improve. Likely related to hypoperfusion of kidneys related to heart failure. Adequate UOP. Will continue to trend.  5. Bilateral lower extremity edema, likely related to CHF. Continues to remains stable. Will continue IV Lasix and monitor. 6. Hyperglycemia. Glucose mildly elevated, 122. Will continue to follow.  7. Asthma, stable. Will continue nebs. 8. Chronic atrial fibrillation, stable, Will continue to hold Pradaxa in the setting of hemoptysis. 9. Severe aortic stenosis s/p AVR with bioprosthetic valve May 2016, asymptomatic 10. Tongue lesion. Worrisome of malignancy, recommend outpatient f/u with ENT. 11. Hemoptysis/epistasis. Patient is no longer coughing up blood today. There has also been reported nose bleeds. No nose bleeds today. Will continue to hold Pradaxa.  12. Hypokalemia, likely related to lasix. Will replace 13. Anemia, chronic. Hemoglobin appears to be near baseline. Hemoptysis appears to have resolved. Continue to follow.  Code Status:DNR DVT prophylaxis: SCDs Family Communication:  Discussed with patient who understands and has no concerns at this time. Disposition Plan: Will move to SDU within 1-2 days   Consultants:  Cardiology-Jonathan Margy Clarks, MD  Procedures:    Antibiotics:    HPI/Subjective: Breathing is better. No wheezing, cough or chest pain. Denies any episodes of epistaxis but feels like its running down her throat.  Objective: Filed Vitals:   05/26/15 0500  BP: 120/46  Pulse: 74  Temp:   Resp: 19    Intake/Output Summary (Last 24 hours) at 05/26/15 0728 Last data filed at 05/26/15 0500  Gross per 24 hour  Intake    120 ml  Output   2100 ml  Net  -1980 ml   Filed Weights   05/24/15 0512 05/25/15 0457 05/26/15 0500  Weight: 99.428 kg (219 lb 3.2 oz) 97.16 kg (214 lb 3.2 oz) 93.3 kg (205 lb 11 oz)    Exam: General:Comfortable, in no acute distress Cardiovascular: Irregular rhythm, regular rate   Respiratory: Bilateral crackles Abdomen: soft, ntnd, bowel sounds normal Musculoskeletal: 1+ bilateral edema   Data Reviewed: Basic Metabolic Panel:  Recent Labs Lab 05/22/15 1905 05/23/15 0708 05/24/15 0628 05/25/15 0639 05/26/15 0519  NA 137 138 139 139 143  K 4.5 4.6 4.1 3.9 3.3*  CL 100* 99* 100* 99* 98*  CO2 29 32 32 32 35*  GLUCOSE 129* 145* 123* 109* 122*  BUN 23* 26* 36* 37* 35*  CREATININE 1.63* 1.63* 1.58* 1.45* 1.39*  CALCIUM 8.3* 8.6* 8.7* 8.8* 8.3*  MG  --  3.0*  --   --   --    Liver Function Tests:  Recent Labs Lab 05/22/15 1905 05/23/15 0708  AST 27 27  ALT 14 17  ALKPHOS 92 93  BILITOT 0.7 0.7  PROT 7.3 7.6  ALBUMIN  3.6 3.6  CBC:  Recent Labs Lab 05/22/15 1905 05/23/15 0708 05/25/15 1301 05/26/15 0519  WBC 6.7 5.4 7.5 4.1  NEUTROABS 5.4  --   --   --   HGB 8.7* 8.7* 7.5* 7.7*  HCT 27.4* 28.0* 23.5* 24.5*  MCV 90.4 90.0 89.7 90.1  PLT 155 147* 161 157     Recent Labs  02/07/15 0520 02/12/15 0435 03/12/15 2156  BNP 204.8* 271.9* 178.0*    Studies: Dg Chest Port 1  View  05/25/2015   CLINICAL DATA:  Respiratory distress  EXAM: 05/22/2015  COMPARISON:  None.  FINDINGS: Severe bilateral multifocal infiltrates diffusely worse. Particularly increased severity lateral right lower lobe and diffusely throughout the right upper lobe. Stable small left pleural effusions. Stable cardiac enlargement, mild.  IMPRESSION: Extensive bilateral infiltrates diffusely worse particularly right upper lobe.   Electronically Signed   By: Esperanza Heir M.D.   On: 05/25/2015 09:23    Scheduled Meds: . aztreonam  2 g Intravenous 3 times per day  . budesonide  0.25 mg Nebulization BID  . Chlorhexidine Gluconate Cloth  6 each Topical Q0600  . dabigatran  150 mg Oral BID  . diltiazem  60 mg Oral 3 times per day  . furosemide  80 mg Intravenous TID  . levalbuterol  1.25 mg Nebulization Q4H  . magnesium oxide  400 mg Oral BID  . methylPREDNISolone (SOLU-MEDROL) injection  40 mg Intravenous Q6H  . montelukast  10 mg Oral QHS  . mupirocin ointment  1 application Nasal BID  . potassium chloride  20 mEq Oral Daily  . 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 If 7PM-7AM, please contact night-coverage at [email or web address], password [facility] 05/26/2015, 7:28 AM  LOS: 3 days     By signing my name below, I, Edman Circle attest that this documentation has been prepared under the direction and in the presence of Erick Blinks, M.D.  Electronically  signed: Edman Circle  05/26/2015    I, Dr. Erick Blinks, personally performed the services described in this documentaiton. All medical record entries made by the scribe were at my direction and in my presence. I have reviewed the chart and agree that the record reflects my personal performance and is accurate and complete  Erick Blinks, MD, 05/26/2015 7:28 AM

## 2015-05-26 NOTE — Progress Notes (Signed)
Patient ID: Christne Platts, female   DOB: Jan 11, 1941, 74 y.o.   MRN: [id number]     Subjective:    Still SOB but improved from yesterday   Objective:   Temp:  [98 F (36.7 C)-98.8 F (37.1 C)] 98.3 F (36.8 C) (09/22 0901) Pulse Rate:  [65-98] 65 (09/22 0730) Resp:  [14-47] 21 (09/22 0730) BP: (112-151)/(36-61) 122/45 mmHg (09/22 0730) SpO2:  [83 %-100 %] 94 % (09/22 0829) FiO2 (%):  [35 %-55 %] 35 % (09/22 0326) Weight:  [205 lb 11 oz (93.3 kg)] 205 lb 11 oz (93.3 kg) (09/22 0500) Last BM Date: 05/23/15  Filed Weights   05/24/15 0512 05/25/15 0457 05/26/15 0500  Weight: 219 lb 3.2 oz (99.428 kg) 214 lb 3.2 oz (97.16 kg) 205 lb 11 oz (93.3 kg)    Intake/Output Summary (Last 24 hours) at 05/26/15 0940 Last data filed at 05/26/15 0500  Gross per 24 hour  Intake      0 ml  Output   2100 ml  Net  -2100 ml    Telemetry: afib rates 70-80s  Exam:  General: NAD  Resp: crackles bilateral bases  Cardiac: irreg, 2/6 systolic murmur RUSB, JVD just below angle of jaw  GI: abdomen soft, NT, ND  MSK: no LE edema  Neuro: no focal deficits   Lab Results:  Basic Metabolic Panel:  Recent Labs Lab 05/23/15 0708 05/24/15 0628 05/25/15 0639 05/26/15 0519  NA 138 139 139 143  K 4.6 4.1 3.9 3.3*  CL 99* 100* 99* 98*  CO2 32 32 32 35*  GLUCOSE 145* 123* 109* 122*  BUN 26* 36* 37* 35*  CREATININE 1.63* 1.58* 1.45* 1.39*  CALCIUM 8.6* 8.7* 8.8* 8.3*  MG 3.0*  --   --   --     Liver Function Tests:  Recent Labs Lab 05/22/15 1905 05/23/15 0708  AST 27 27  ALT 14 17  ALKPHOS 92 93  BILITOT 0.7 0.7  PROT 7.3 7.6  ALBUMIN 3.6 3.6    CBC:  Recent Labs Lab 05/23/15 0708 05/25/15 1301 05/26/15 0519  WBC 5.4 7.5 4.1  HGB 8.7* 7.5* 7.7*  HCT 28.0* 23.5* 24.5*  MCV 90.0 89.7 90.1  PLT 147* 161 157    Cardiac Enzymes: No results for input(s): CKTOTAL, CKMB, CKMBINDEX, TROPONINI in the last 168 hours.  BNP: No results for input(s): PROBNP in the last 8760  hours.  Coagulation:  Recent Labs Lab 05/22/15 1905  INR 5.23*    ECG:   Medications:   Scheduled Medications: . aztreonam  2 g Intravenous 3 times per day  . budesonide  0.25 mg Nebulization BID  . Chlorhexidine Gluconate Cloth  6 each Topical Q0600  . dabigatran  150 mg Oral BID  . diltiazem  60 mg Oral 3 times per day  . furosemide  80 mg Intravenous TID  . levalbuterol  1.25 mg Nebulization Q4H  . magnesium oxide  400 mg Oral BID  . methylPREDNISolone (SOLU-MEDROL) injection  40 mg Intravenous Q6H  . montelukast  10 mg Oral QHS  . mupirocin ointment  1 application Nasal BID  . potassium chloride  20 mEq Oral Daily  . vancomycin  1,250 mg Intravenous Q24H     Infusions:     PRN Medications:  albuterol, benzocaine, traMADol     Assessment/Plan   1. Acute on chronic LV diastolic/RV systolic HF - 03/2015 echo 55-60%, cannot eval diastolic function due to afib but severe LAE, moderate to severe RV  failure - negative 2 liters yesterday, negative 5 liters since admission. She is on lasix  tid, Cr and BUN trending down with diuresis consistent with venous congestion and CHF. Continue current lasix - bipap yesterday for worsening respiraotry status, continue to follow  2. Afib -rate control with diltiazem, dose was increased to  tid yesterday. Rates 70s-80s.  - on pradaxa for stroke prevention  3. Pneumonia - abx per primary team  4. Hypokalemia - replacement per primary team.     Dina Rich, M.D.

## 2015-05-26 NOTE — Progress Notes (Signed)
Patient stated that if there was an emergency, she would like [place] to contact her friend Arminda Resides to make healthcare decisions if she was unable. Patient stated that she did not have any close family members to make decisions for her. [phone]

## 2015-05-26 NOTE — Progress Notes (Signed)
Subjective: She is markedly improved from yesterday. She is on BiPAP at but switching to nasal oxygen  Objective: Vital signs in last 24 hours: Temp:  [98 F (36.7 C)-98.8 F (37.1 C)] 98.4 F (36.9 C) (09/22 0400) Pulse Rate:  [65-103] 65 (09/22 0730) Resp:  [14-47] 21 (09/22 0730) BP: (112-151)/(36-61) 122/45 mmHg (09/22 0730) SpO2:  [83 %-100 %] 94 % (09/22 0829) FiO2 (%):  [35 %-55 %] 35 % (09/22 0326) Weight:  [93.3 kg (205 lb 11 oz)] 93.3 kg (205 lb 11 oz) (09/22 0500) Weight change: -3.86 kg (-8 lb 8.2 oz) Last BM Date: 05/23/15  Intake/Output from previous day: 09/21 0701 - 09/22 0700 In: 120 [P.O.:120] Out: 2100 [Urine:2100]  PHYSICAL EXAM General appearance: alert, cooperative and mild distress Resp: rhonchi bilaterally Cardio: irregularly irregular rhythm GI: soft, non-tender; bowel sounds normal; no masses,  no organomegaly Extremities: Her lower leg edema is better  Lab Results:  Results for orders placed or performed during the hospital encounter of 05/22/15 (from the past 48 hour(s))  Basic metabolic panel     Status: Abnormal   Collection Time: 05/25/15  6:39 AM  Result Value Ref Range   Sodium 139 135 - 145 mmol/L   Potassium 3.9 3.5 - 5.1 mmol/L   Chloride 99 (L) 101 - 111 mmol/L   CO2 32 22 - 32 mmol/L   Glucose, Bld 109 (H) 65 - 99 mg/dL   BUN 37 (H) 6 - 20 mg/dL   Creatinine, Ser 1.45 (H) 0.44 - 1.00 mg/dL   Calcium 8.8 (L) 8.9 - 10.3 mg/dL   GFR calc non Af Amer 35 (L) >60 mL/min   GFR calc Af Amer 40 (L) >60 mL/min    Comment: (NOTE) The eGFR has been calculated using the CKD EPI equation. This calculation has not been validated in all clinical situations. eGFR's persistently <60 mL/min signify possible Chronic Kidney Disease.    Anion gap 8 5 - 15  Blood gas, arterial     Status: Abnormal   Collection Time: 05/25/15  9:10 AM  Result Value Ref Range   FIO2 50.00    Delivery systems OXYGEN MASK    pH, Arterial 7.474 (H) 7.350 - 7.450    pCO2 arterial 42.4 35.0 - 45.0 mmHg   pO2, Arterial 58.4 (L) 80.0 - 100.0 mmHg   Bicarbonate 30.5 (H) 20.0 - 24.0 mEq/L   Acid-Base Excess 6.9 (H) 0.0 - 2.0 mmol/L   O2 Saturation 90.1 %   Collection site LEFT BRACHIAL    Drawn by [id number]    Sample type ARTERIAL   MRSA PCR Screening     Status: Abnormal   Collection Time: 05/25/15 11:15 AM  Result Value Ref Range   MRSA by PCR POSITIVE (A) NEGATIVE    Comment:        The GeneXpert MRSA Assay (FDA approved for NASAL specimens only), is one component of a comprehensive MRSA colonization surveillance program. It is not intended to diagnose MRSA infection nor to guide or monitor treatment for MRSA infections. RESULT CALLED TO, READ BACK BY AND VERIFIED WITH: SPANGLER,E. AT 1618 ON 05/25/2015 BY BAUGHAM,M.   Blood gas, arterial     Status: Abnormal   Collection Time: 05/25/15 12:55 PM  Result Value Ref Range   FIO2 40.00    Delivery systems BILEVEL POSITIVE AIRWAY PRESSURE    Mode BILEVEL POSITIVE AIRWAY PRESSURE    Inspiratory PAP 20    Expiratory PAP 8    pH, Arterial 7.501 (  H) 7.350 - 7.450   pCO2 arterial 43.7 35.0 - 45.0 mmHg   pO2, Arterial 69.4 (L) 80.0 - 100.0 mmHg   Bicarbonate 33.6 (H) 20.0 - 24.0 mEq/L   Acid-Base Excess 10.1 (H) 0.0 - 2.0 mmol/L   O2 Saturation 94.7 %   Collection site LEFT RADIAL    Drawn by [id number]    Sample type ARTHROGRAPHIS SPECIES    Allens test (pass/fail) PASS PASS  CBC     Status: Abnormal   Collection Time: 05/25/15  1:01 PM  Result Value Ref Range   WBC 7.5 4.0 - 10.5 K/uL   RBC 2.62 (L) 3.87 - 5.11 MIL/uL   Hemoglobin 7.5 (L) 12.0 - 15.0 g/dL   HCT 23.5 (L) 36.0 - 46.0 %   MCV 89.7 78.0 - 100.0 fL   MCH 28.6 26.0 - 34.0 pg   MCHC 31.9 30.0 - 36.0 g/dL   RDW 16.2 (H) 11.5 - 15.5 %   Platelets 161 150 - 400 K/uL  Blood gas, arterial     Status: Abnormal   Collection Time: 05/26/15  4:50 AM  Result Value Ref Range   FIO2 35.00    Delivery systems BILEVEL POSITIVE AIRWAY PRESSURE     Inspiratory PAP 12.0    Expiratory PAP 8.0    pH, Arterial 7.495 (H) 7.350 - 7.450   pCO2 arterial 46.2 (H) 35.0 - 45.0 mmHg   pO2, Arterial 66.1 (L) 80.0 - 100.0 mmHg   Bicarbonate 34.8 (H) 20.0 - 24.0 mEq/L   TCO2 10.0 0 - 100 mmol/L   Acid-Base Excess 11.3 (H) 0.0 - 2.0 mmol/L   Collection site RIGHT RADIAL    Drawn by [id number]    Sample type ARTERIAL    Allens test (pass/fail) PASS PASS  CBC     Status: Abnormal   Collection Time: 05/26/15  5:19 AM  Result Value Ref Range   WBC 4.1 4.0 - 10.5 K/uL   RBC 2.72 (L) 3.87 - 5.11 MIL/uL   Hemoglobin 7.7 (L) 12.0 - 15.0 g/dL   HCT 24.5 (L) 36.0 - 46.0 %   MCV 90.1 78.0 - 100.0 fL   MCH 28.3 26.0 - 34.0 pg   MCHC 31.4 30.0 - 36.0 g/dL   RDW 15.8 (H) 11.5 - 15.5 %   Platelets 157 150 - 400 K/uL  Basic metabolic panel     Status: Abnormal   Collection Time: 05/26/15  5:19 AM  Result Value Ref Range   Sodium 143 135 - 145 mmol/L   Potassium 3.3 (L) 3.5 - 5.1 mmol/L   Chloride 98 (L) 101 - 111 mmol/L   CO2 35 (H) 22 - 32 mmol/L   Glucose, Bld 122 (H) 65 - 99 mg/dL   BUN 35 (H) 6 - 20 mg/dL   Creatinine, Ser 1.39 (H) 0.44 - 1.00 mg/dL   Calcium 8.3 (L) 8.9 - 10.3 mg/dL   GFR calc non Af Amer 37 (L) >60 mL/min   GFR calc Af Amer 42 (L) >60 mL/min    Comment: (NOTE) The eGFR has been calculated using the CKD EPI equation. This calculation has not been validated in all clinical situations. eGFR's persistently <60 mL/min signify possible Chronic Kidney Disease.    Anion gap 10 5 - 15    ABGS  Recent Labs  05/26/15 0450  PHART 7.495*  PO2ART 66.1*  TCO2 10.0  HCO3 34.8*   CULTURES Recent Results (from the past 240 hour(s))  MRSA PCR Screening  Status: Abnormal   Collection Time: 05/25/15 11:15 AM  Result Value Ref Range Status   MRSA by PCR POSITIVE (A) NEGATIVE Final    Comment:        The GeneXpert MRSA Assay (FDA approved for NASAL specimens only), is one component of a comprehensive MRSA  colonization surveillance program. It is not intended to diagnose MRSA infection nor to guide or monitor treatment for MRSA infections. RESULT CALLED TO, READ BACK BY AND VERIFIED WITH: SPANGLER,E. AT 8088 ON 05/25/2015 BY BAUGHAM,M.    Studies/Results: Dg Chest Port 1 View  05/26/2015   CLINICAL DATA:  Respiratory failure.  EXAM: PORTABLE CHEST - 1 VIEW  COMPARISON:  05/25/2015.  FINDINGS: Prior cardiac valve replacement. Left atrial appendage clip noted. Stable cardiomegaly. Diffuse multifocal bilateral pulmonary infiltrates particularly prominent in the right upper lobe again noted. Small bilateral pleural effusions. No pneumothorax.  IMPRESSION: 1. Diffuse multifocal bilateral pulmonary infiltrates particularly prominent in the right upper lobe again noted. No significant interim change. 2. Prior cardiac valve replacement.  Stable cardiomegaly.   Electronically Signed   By: Marcello Moores  Register   On: 05/26/2015 08:02   Dg Chest Port 1 View  05/25/2015   CLINICAL DATA:  Respiratory distress  EXAM: 05/22/2015  COMPARISON:  None.  FINDINGS: Severe bilateral multifocal infiltrates diffusely worse. Particularly increased severity lateral right lower lobe and diffusely throughout the right upper lobe. Stable small left pleural effusions. Stable cardiac enlargement, mild.  IMPRESSION: Extensive bilateral infiltrates diffusely worse particularly right upper lobe.   Electronically Signed   By: Skipper Cliche M.D.   On: 05/25/2015 09:23    Medications:  Prior to Admission:  Prescriptions prior to admission  Medication Sig Dispense Refill Last Dose  . albuterol (PROAIR HFA) 108 (90 BASE) MCG/ACT inhaler Inhale 2 puffs into the lungs 4 (four) times daily as needed for wheezing or shortness of breath.   05/22/2015 at Unknown time  . albuterol (PROVENTIL) (2.5 MG/3ML) 0.083% nebulizer solution Take 3 mLs (2.5 mg total) by nebulization every 4 (four) hours as needed for wheezing or shortness of breath. 75 mL  12 05/22/2015 at Unknown time  . amiodarone (PACERONE) 200 MG tablet Take 200 mg by mouth daily.   05/22/2015 at Unknown time  . budesonide (PULMICORT) 0.25 MG/2ML nebulizer solution Take 2 mLs (0.25 mg total) by nebulization 2 (two) times daily. 60 mL 11 05/22/2015 at Unknown time  . dabigatran (PRADAXA) 150 MG CAPS capsule Take 1 capsule (150 mg total) by mouth 2 (two) times daily. 60 capsule 6 05/22/2015 at Unknown time  . diltiazem (CARDIZEM) 30 MG tablet Take 1 tablet (30 mg total) by mouth 2 (two) times daily. 60 tablet 2 05/22/2015 at Unknown time  . Fluticasone Propionate, Inhal, (FLOVENT DISKUS) 250 MCG/BLIST AEPB Inhale 1 puff into the lungs 2 (two) times daily. 60 each 3 Past Week at Unknown time  . magnesium oxide (MAG-OX) 400 MG tablet Take 400 mg by mouth 2 (two) times daily.   05/22/2015 at Unknown time  . potassium chloride (K-DUR) 10 MEQ tablet Take 2 tablets (20 mEq total) by mouth daily. 60 tablet 6 05/22/2015 at Unknown time  . torsemide (DEMADEX) 20 MG tablet Take 2 tablets (40 mg total) by mouth AC breakfast. (Patient taking differently: Take 20 mg by mouth 2 (two) times daily. ) 90 tablet 3 05/22/2015 at Unknown time  . traMADol (ULTRAM) 50 MG tablet Take 1 tablet (50 mg total) by mouth every 6 (six) hours as needed (pain).  50 tablet 0 05/22/2015 at Unknown time  . docusate sodium (COLACE) 100 MG capsule Take 1 capsule (100 mg total) by mouth daily as needed for mild constipation. (Patient not taking: Reported on 05/22/2015) 30 capsule 1   . torsemide (DEMADEX) 20 MG tablet Take 1 tablet (20 mg total) by mouth daily after supper. (Patient not taking: Reported on 05/22/2015) 30 tablet 0 Taking   Scheduled: . aztreonam  2 g Intravenous 3 times per day  . budesonide  0.25 mg Nebulization BID  . Chlorhexidine Gluconate Cloth  6 each Topical Q0600  . dabigatran  150 mg Oral BID  . diltiazem  60 mg Oral 3 times per day  . furosemide  80 mg Intravenous TID  . levalbuterol  1.25 mg  Nebulization Q4H  . magnesium oxide  400 mg Oral BID  . methylPREDNISolone (SOLU-MEDROL) injection  40 mg Intravenous Q6H  . montelukast  10 mg Oral QHS  . mupirocin ointment  1 application Nasal BID  . potassium chloride  20 mEq Oral Daily  . vancomycin  1,250 mg Intravenous Q24H   Continuous:  PXT:GGYIRSWNI, benzocaine, traMADol  Assesment: She was admitted with heart failure and respiratory failure. She had acute deterioration yesterday requiring transfer to stepdown and the use of BiPAP. She is significantly better today. 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 See if she can come off BiPAP and switch to nasal oxygen    LOS: 3 days   Fount Bahe L 05/26/2015, 8:55 AM

## 2015-05-27 DIAGNOSIS — J181 Lobar pneumonia, unspecified organism: Secondary | ICD-10-CM

## 2015-05-27 LAB — CBC
HCT: 26.7 % — ABNORMAL LOW (ref 36.0–46.0)
HEMOGLOBIN: 8.4 g/dL — AB (ref 12.0–15.0)
MCH: 28.1 pg (ref 26.0–34.0)
MCHC: 31.5 g/dL (ref 30.0–36.0)
MCV: 89.3 fL (ref 78.0–100.0)
PLATELETS: 227 10*3/uL (ref 150–400)
RBC: 2.99 MIL/uL — AB (ref 3.87–5.11)
RDW: 15.7 % — ABNORMAL HIGH (ref 11.5–15.5)
WBC: 13 10*3/uL — AB (ref 4.0–10.5)

## 2015-05-27 LAB — BASIC METABOLIC PANEL
ANION GAP: 9 (ref 5–15)
BUN: 50 mg/dL — ABNORMAL HIGH (ref 6–20)
CALCIUM: 8.3 mg/dL — AB (ref 8.9–10.3)
CHLORIDE: 97 mmol/L — AB (ref 101–111)
CO2: 33 mmol/L — AB (ref 22–32)
CREATININE: 1.85 mg/dL — AB (ref 0.44–1.00)
GFR calc non Af Amer: 26 mL/min — ABNORMAL LOW (ref >60)
GFR, EST AFRICAN AMERICAN: 30 mL/min — AB (ref >60)
Glucose, Bld: 148 mg/dL — ABNORMAL HIGH (ref 65–99)
Potassium: 2.9 mmol/L — ABNORMAL LOW (ref 3.5–5.1)
SODIUM: 139 mmol/L (ref 135–145)

## 2015-05-27 LAB — MAGNESIUM: MAGNESIUM: 2 mg/dL (ref 1.7–2.4)

## 2015-05-27 MED ORDER — LEVOFLOXACIN IN D5W 750 MG/150ML IV SOLN
750.0000 mg | Freq: Once | INTRAVENOUS | Status: DC
  Filled 2015-05-27: qty 150

## 2015-05-27 MED ORDER — VANCOMYCIN HCL IN DEXTROSE 1-5 GM/200ML-% IV SOLN
1000.0000 mg | INTRAVENOUS | Status: DC
  Filled 2015-05-27: qty 200

## 2015-05-27 MED ORDER — POTASSIUM CHLORIDE CRYS ER 20 MEQ PO TBCR
40.0000 meq | EXTENDED_RELEASE_TABLET | ORAL | Status: AC
  Administered 2015-05-27 (×3): 40 meq via ORAL
  Filled 2015-05-27 (×3): qty 2

## 2015-05-27 MED ORDER — LEVOFLOXACIN IN D5W 500 MG/100ML IV SOLN
500.0000 mg | INTRAVENOUS | Status: DC
  Administered 2015-05-29: 500 mg via INTRAVENOUS
  Filled 2015-05-27: qty 100

## 2015-05-27 NOTE — Evaluation (Signed)
Physical Therapy Evaluation Patient Details Name: Sandra Turner MRN: [id number] DOB: March 15, 1941 Today's Date: 05/27/2015   History of Present Illness  With h/o afib s/p maze procedure in 02/2015, h/o aortic valve stenosis s/p bioprosthetic valve replacement, h/o diastolic chf and asthma, h/o chronic hypoxic respiratory failure on home oxygen 2.5 liter, presented to the ED due to sob, increasing bilateral lower extremity edema. She reported her lower extremity edema has resolved after she was discharged in July. But it has gradually become worse. Her diuretic dose was decreased on 8/25. She also reported there might be component of seasonal allergy, since she stayed outside whole day yesterday. She denies fever, no cough, no wheezing, no chest pain.  Clinical Impression   Pt was seen for an evaluation in ICU.  She was found to be generally deconditioned due to recent illness.  She lives alone in a mobile home with 4 steps to enter.  She is normally independent at home but is very sedentary at baseline.  She refused to get OOB due to general fatigue.  She states that she will have no difficulty transitioning to home and she does not need any HHPT at d/c.  I question her objectivity however cannot force PT upon her.      Follow Up Recommendations No PT follow up    Equipment Recommendations  None recommended by PT    Recommendations for Other Services   none    Precautions / Restrictions Precautions Precautions: Fall Restrictions Weight Bearing Restrictions: No      Mobility  Bed Mobility               General bed mobility comments: unable to assess...pt refuses  Transfers                    Ambulation/Gait                Stairs            Wheelchair Mobility    Modified Rankin (Stroke Patients Only)       Balance Overall balance assessment:  (unable to assess)                                           Pertinent Vitals/Pain  Pain Assessment: No/denies pain    Home Living   Living Arrangements: Alone Available Help at Discharge: Family;Available PRN/intermittently Type of Home: House Home Access: Stairs to enter Entrance Stairs-Rails: Right;Left;Can reach both Entrance Stairs-Number of Steps: 4 Home Layout: One level Home Equipment: None      Prior Function Level of Independence: Independent               Hand Dominance        Extremity/Trunk Assessment               Lower Extremity Assessment: Generalized weakness         Communication   Communication: No difficulties  Cognition Arousal/Alertness: Awake/alert Behavior During Therapy: WFL for tasks assessed/performed Overall Cognitive Status: Within Functional Limits for tasks assessed                      General Comments      Exercises        Assessment/Plan    PT Assessment Patent does not need any further PT services  PT Diagnosis     PT  Problem List    PT Treatment Interventions     PT Goals (Current goals can be found in the Care Plan section) Acute Rehab PT Goals PT Goal Formulation: All assessment and education complete, DC therapy    Frequency     Barriers to discharge        Co-evaluation               End of Session Equipment Utilized During Treatment: Oxygen Activity Tolerance: Patient limited by fatigue Patient left: in bed;with call bell/phone within reach Nurse Communication: Mobility status         Time: 1240-1255 PT Time Calculation (min) (ACUTE ONLY): 15 min   Charges:   PT Evaluation $Initial PT Evaluation Tier I: 1 Procedure     PT G CodesMyrlene Broker L  PT 05/27/2015, 4:09 PM [phone]

## 2015-05-27 NOTE — Care Management Important Message (Signed)
Important Message  Patient Details  Name: Sandra Turner MRN: [id number] Date of Birth: 06-Nov-1940   Medicare Important Message Given:  Yes-second notification given    Malcolm Metro, RN 05/27/2015, 11:05 AM

## 2015-05-27 NOTE — Care Management Note (Signed)
Case Management Note  Patient Details  Name: Tyann Niehaus MRN: [id number] Date of Birth: [birth year]  Expected Discharge Date:    05/30/2015              Expected Discharge Plan:  Home w Home Health Services  In-House Referral:  NA  Discharge planning Services  CM Consult  Post Acute Care Choice:  NA Choice offered to:  NA  DME Arranged:    DME Agency:     HH Arranged:    HH Agency:     Status of Service:  In process, will continue to follow  Medicare Important Message Given:  Yes-second notification given Date Medicare IM Given:    Medicare IM give by:    Date Additional Medicare IM Given:    Additional Medicare Important Message give by:     If discussed at Long Length of Stay Meetings, dates discussed:    Additional Comments: DC not anticipated over weekend. PT will eval to determine discharge needs. Pt hopeful she will be able to go home from hospital with [facility]. CM will cont to follow for DC planning. Anticipate transfer from SDU 05/28/2015.  Malcolm Metro, RN 05/27/2015, 11:05 AM

## 2015-05-27 NOTE — Progress Notes (Signed)
Patient ID: Sandra Turner, female   DOB: 1941-05-27, 74 y.o.   MRN: [id number]     Subjective:    SOB improving  Objective:   Temp:  [98.1 F (36.7 C)-98.7 F (37.1 C)] 98.7 F (37.1 C) (09/23 0400) Pulse Rate:  [67-135] 73 (09/23 0817) Resp:  [17-32] 19 (09/23 0800) BP: (112-140)/(32-50) 138/50 mmHg (09/23 0800) SpO2:  [88 %-99 %] 97 % (09/23 0817) FiO2 (%):  [50 %-55 %] 55 % (09/23 0257) Weight:  [209 lb 10.5 oz (95.1 kg)] 209 lb 10.5 oz (95.1 kg) (09/23 0500) Last BM Date: 05/26/15  Filed Weights   05/25/15 0457 05/26/15 0500 05/27/15 0500  Weight: 214 lb 3.2 oz (97.16 kg) 205 lb 11 oz (93.3 kg) 209 lb 10.5 oz (95.1 kg)    Intake/Output Summary (Last 24 hours) at 05/27/15 0956 Last data filed at 05/27/15 0600  Gross per 24 hour  Intake    880 ml  Output   2250 ml  Net  -1370 ml    Telemetry: afib rates 70s  Exam:  General: NAD  HEENT: sclera clear  Resp: Coarse bilaterally  Cardiac: irreg, 2/6 systolic murmur RUSB, no JVD but + hepatojulgarl reflux  GI: abdomen soft, NT, ND  MSK: no LE edema  Neuro: no focal deficits  Psych: appropriate affect  Lab Results:  Basic Metabolic Panel:  Recent Labs Lab 05/23/15 0708  05/25/15 0639 05/26/15 0519 05/27/15 0453  NA 138  < > 139 143 139  K 4.6  < > 3.9 3.3* 2.9*  CL 99*  < > 99* 98* 97*  CO2 32  < > 32 35* 33*  GLUCOSE 145*  < > 109* 122* 148*  BUN 26*  < > 37* 35* 50*  CREATININE 1.63*  < > 1.45* 1.39* 1.85*  CALCIUM 8.6*  < > 8.8* 8.3* 8.3*  MG 3.0*  --   --   --   --   < > = values in this interval not displayed.  Liver Function Tests:  Recent Labs Lab 05/22/15 1905 05/23/15 0708  AST 27 27  ALT 14 17  ALKPHOS 92 93  BILITOT 0.7 0.7  PROT 7.3 7.6  ALBUMIN 3.6 3.6    CBC:  Recent Labs Lab 05/23/15 0708 05/25/15 1301 05/26/15 0519  WBC 5.4 7.5 4.1  HGB 8.7* 7.5* 7.7*  HCT 28.0* 23.5* 24.5*  MCV 90.0 89.7 90.1  PLT 147* 161 157    Cardiac Enzymes: No results for input(s):  CKTOTAL, CKMB, CKMBINDEX, TROPONINI in the last 168 hours.  BNP: No results for input(s): PROBNP in the last 8760 hours.  Coagulation:  Recent Labs Lab 05/22/15 1905  INR 5.23*    ECG:   Medications:   Scheduled Medications: . aztreonam  2 g Intravenous 3 times per day  . budesonide  0.25 mg Nebulization BID  . Chlorhexidine Gluconate Cloth  6 each Topical Q0600  . dabigatran  150 mg Oral BID  . diltiazem  60 mg Oral 3 times per day  . furosemide  80 mg Intravenous TID  . levalbuterol  1.25 mg Nebulization Q4H  . [START ON 05/29/2015] levofloxacin (LEVAQUIN) IV  500 mg Intravenous Q48H  . levofloxacin (LEVAQUIN) IV  750 mg Intravenous Once  . magnesium oxide  400 mg Oral BID  . methylPREDNISolone (SOLU-MEDROL) injection  40 mg Intravenous Q6H  . montelukast  10 mg Oral QHS  . mupirocin ointment  1 application Nasal BID  . potassium chloride  20 mEq  Oral Daily  . potassium chloride  40 mEq Oral Q3H     Infusions:     PRN Medications:  albuterol, benzocaine, traMADol     Assessment/Plan    1. 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 She is on lasix  tid, elevated Cr and BUN. Will hold IV lasix today.  2. Afib -rate control with diltiazem, dose was increased to  tid yesterday. Rates 70s-80s.  - on pradaxa for stroke prevention. Decrease in GFR today, if remains <30 will need to change pradaxa to  bid. Suspect only transient AKI due to diuresis and potentially abx   3. Pneumonia - abx per primary team  4. Hypokalemia - replacement per primary team.  8. Anemia - per primary team    Dina Rich, M.D.

## 2015-05-27 NOTE — Progress Notes (Signed)
[facility] PROGRESS NOTE  Any Mcneice UJW:[id number] DOB: 11/26/1940 DOA: 05/22/2015 PCP: [name], MD  Assessment/Plan:  Acute on chronic diastolic CHF exacerbation. On IV Lasix with continued good urine output. Cardiology following. Volume status appears to be improving. With rising creatinine, can consider changing Lasix to PO. Will defer to cardiology.   Acute on chronic hypoxic respiratory failure on 2.5L at home, related to CHF exacerbation/ HCAP. Patient's breathing had decompensated requiring BiPAP however now she is clinically improved and back on nasal cannula. Pulmonology consulted and agrees with current treatment plan. Will continue to wean off O2 as tolerated.  Lobar Pneumonia. Patient found to have infiltrates in the right upper lobe. Continue broad spectrum abx. Afebrile, WBC wnl.   AKI superimposed on CKD stage III. Creatinine worsening today. Likely related to diuresis with Lasix and possibly Vancomycin. Continues to have good UOP. Will hold Vancomycin for now and follow renal function.    Bilateral lower extremity edema, likely related to CHF. Continues to improve.   Hyperglycemia. Glucose mildly elevated, 148. Will continue to monitor.  Asthma, stable. Will continue nebs and IV steroids.  Chronic atrial fibrillation, rate controlled with Cardizem and anticoagulated with Pradaxa. Cardiology consulted and agrees with current management.    Severe aortic stenosis s/p AVR with bioprosthetic valve May 2016, asymptomatic  Tongue lesion. Worrisome of malignancy, recommend outpatient f/u with ENT.  Hemoptysis/epistasis. Patient reports improvement of hemoptysis. There has also been reported nose bleeds. No nose bleeds today. Will continue Pradaxa.  Hypokalemia, likely related to lasix. Will replete  Anemia, chronic. Hemoglobin appears to be near baseline. Continue to follow.  Code Status:DNR DVT prophylaxis: SCDs Family Communication:No family at bedside.  Discussed with patient who understands and has no concerns at this time. Disposition Plan: Discharge home within 1-2 days.  Consultants:  Cardiology-Jonathan Margy Clarks, MD  Pulmonology- Kari Baars, MD  Procedures:    Antibiotics:  Vancomycin 9/21>>9/23  Aztreonam 9/21>>  Levaquin 9/23>>  HPI/Subjective: Feels improved. Has a cough productive of clear sputum with intermittent blood streaks. Breathing is much better. Denies any CP, nausea, vomiting, or gross hemoptysis.   Objective: Filed Vitals:   05/27/15 0500  BP: 120/40  Pulse: 79  Temp:   Resp: 25    Intake/Output Summary (Last 24 hours) at 05/27/15 0711 Last data filed at 05/27/15 0600  Gross per 24 hour  Intake    880 ml  Output   2250 ml  Net  -1370 ml   Filed Weights   05/25/15 0457 05/26/15 0500 05/27/15 0500  Weight: 97.16 kg (214 lb 3.2 oz) 93.3 kg (205 lb 11 oz) 95.1 kg (209 lb 10.5 oz)    Exam:   General: NAD, looks comfortable  Cardiovascular: Irregular rhythm and rate, S1, S2   Respiratory: clear bilaterally, No wheezing, rales or rhnochi  Abdomen: soft, non tender, no distention , bowel sounds normal  Musculoskeletal: No edema b/l  Data Reviewed: Basic Metabolic Panel:  Recent Labs Lab 05/23/15 0708 05/24/15 0628 05/25/15 0639 05/26/15 0519 05/27/15 0453  NA 138 139 139 143 139  K 4.6 4.1 3.9 3.3* 2.9*  CL 99* 100* 99* 98* 97*  CO2 32 32 32 35* 33*  GLUCOSE 145* 123* 109* 122* 148*  BUN 26* 36* 37* 35* 50*  CREATININE 1.63* 1.58* 1.45* 1.39* 1.85*  CALCIUM 8.6* 8.7* 8.8* 8.3* 8.3*  MG 3.0*  --   --   --   --    Liver Function Tests:  Recent Labs Lab 05/22/15  1905 05/23/15 0708  AST 27 27  ALT 14 17  ALKPHOS 92 93  BILITOT 0.7 0.7  PROT 7.3 7.6  ALBUMIN 3.6 3.6  CBC:  Recent Labs Lab 05/22/15 1905 05/23/15 0708 05/25/15 1301 05/26/15 0519  WBC 6.7 5.4 7.5 4.1  NEUTROABS 5.4  --   --   --   HGB 8.7* 8.7* 7.5* 7.7*  HCT 27.4* 28.0* 23.5* 24.5*  MCV  90.4 90.0 89.7 90.1  PLT 155 147* 161 157     Recent Labs  02/07/15 0520 02/12/15 0435 03/12/15 2156  BNP 204.8* 271.9* 178.0*    Studies: Dg Chest Port 1 View  05/26/2015   CLINICAL DATA:  Respiratory failure.  EXAM: PORTABLE CHEST - 1 VIEW  COMPARISON:  05/25/2015.  FINDINGS: Prior cardiac valve replacement. Left atrial appendage clip noted. Stable cardiomegaly. Diffuse multifocal bilateral pulmonary infiltrates particularly prominent in the right upper lobe again noted. Small bilateral pleural effusions. No pneumothorax.  IMPRESSION: 1. Diffuse multifocal bilateral pulmonary infiltrates particularly prominent in the right upper lobe again noted. No significant interim change. 2. Prior cardiac valve replacement.  Stable cardiomegaly.   Electronically Signed   By: Maisie Fus  Register   On: 05/26/2015 08:02   Dg Chest Port 1 View  05/25/2015   CLINICAL DATA:  Respiratory distress  EXAM: 05/22/2015  COMPARISON:  None.  FINDINGS: Severe bilateral multifocal infiltrates diffusely worse. Particularly increased severity lateral right lower lobe and diffusely throughout the right upper lobe. Stable small left pleural effusions. Stable cardiac enlargement, mild.  IMPRESSION: Extensive bilateral infiltrates diffusely worse particularly right upper lobe.   Electronically Signed   By: Esperanza Heir M.D.   On: 05/25/2015 09:23    Scheduled Meds: . aztreonam  2 g Intravenous 3 times per day  . budesonide  0.25 mg Nebulization BID  . Chlorhexidine Gluconate Cloth  6 each Topical Q0600  . dabigatran  150 mg Oral BID  . diltiazem  60 mg Oral 3 times per day  . furosemide  80 mg Intravenous TID  . levalbuterol  1.25 mg Nebulization Q4H  . magnesium oxide  400 mg Oral BID  . methylPREDNISolone (SOLU-MEDROL) injection  40 mg Intravenous Q6H  . montelukast  10 mg Oral QHS  . mupirocin ointment  1 application Nasal BID  . potassium chloride  20 mEq Oral Daily  . 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 If 7PM-7AM, please contact night-coverage at [email or web address], password [facility] 05/27/2015, 7:11 AM  LOS: 4 days     By signing my name below, I, Burnett Harry, attest that this documentation has been prepared under the direction and in the presence of [facility]. MD Electronically Signed: Burnett Harry, Scribe. 05/27/2015   I, Dr. Erick Blinks, personally performed the services described in this documentaiton. All medical record entries made by the scribe were at my direction  and in my presence. I have reviewed the chart and agree that the record reflects my personal performance and is accurate and complete  Erick Blinks, MD, 05/27/2015 9:17 AM

## 2015-05-27 NOTE — Progress Notes (Signed)
Subjective: She says she feels much better. She has had excellent urine output. She is being treated for healthcare associated pneumonia as well. She says she feels much improved. She did not require BiPAP last night she is coughing up sputum but it's mostly blood with a little infected appearing material with it  Objective: Vital signs in last 24 hours: Temp:  [98.1 F (36.7 C)-98.7 F (37.1 C)] 98.7 F (37.1 C) (09/23 0400) Pulse Rate:  [70-135] 79 (09/23 0500) Resp:  [17-32] 25 (09/23 0500) BP: (112-136)/(32-50) 120/40 mmHg (09/23 0500) SpO2:  [85 %-99 %] 93 % (09/23 0500) FiO2 (%):  [50 %-55 %] 55 % (09/23 0257) Weight:  [95.1 kg (209 lb 10.5 oz)] 95.1 kg (209 lb 10.5 oz) (09/23 0500) Weight change: 1.8 kg (3 lb 15.5 oz) Last BM Date: 05/26/15  Intake/Output from previous day: 09/22 0701 - 09/23 0700 In: 880 [P.O.:480; IV Piggyback:400] Out: 2250 [Urine:2250]  PHYSICAL EXAM General appearance: alert, cooperative and mild distress Resp: rhonchi bilaterally Cardio: irregularly irregular rhythm GI: soft, non-tender; bowel sounds normal; no masses,  no organomegaly Extremities: Her edema is improving  Lab Results:  Results for orders placed or performed during the hospital encounter of 05/22/15 (from the past 48 hour(s))  Blood gas, arterial     Status: Abnormal   Collection Time: 05/25/15  9:10 AM  Result Value Ref Range   FIO2 50.00    Delivery systems OXYGEN MASK    pH, Arterial 7.474 (H) 7.350 - 7.450   pCO2 arterial 42.4 35.0 - 45.0 mmHg   pO2, Arterial 58.4 (L) 80.0 - 100.0 mmHg   Bicarbonate 30.5 (H) 20.0 - 24.0 mEq/L   Acid-Base Excess 6.9 (H) 0.0 - 2.0 mmol/L   O2 Saturation 90.1 %   Collection site LEFT BRACHIAL    Drawn by [id number]    Sample type ARTERIAL   MRSA PCR Screening     Status: Abnormal   Collection Time: 05/25/15 11:15 AM  Result Value Ref Range   MRSA by PCR POSITIVE (A) NEGATIVE    Comment:        The GeneXpert MRSA Assay (FDA approved for  NASAL specimens only), is one component of a comprehensive MRSA colonization surveillance program. It is not intended to diagnose MRSA infection nor to guide or monitor treatment for MRSA infections. RESULT CALLED TO, READ BACK BY AND VERIFIED WITH: SPANGLER,E. AT 1618 ON 05/25/2015 BY BAUGHAM,M.   Blood gas, arterial     Status: Abnormal   Collection Time: 05/25/15 12:55 PM  Result Value Ref Range   FIO2 40.00    Delivery systems BILEVEL POSITIVE AIRWAY PRESSURE    Mode BILEVEL POSITIVE AIRWAY PRESSURE    Inspiratory PAP 20    Expiratory PAP 8    pH, Arterial 7.501 (H) 7.350 - 7.450   pCO2 arterial 43.7 35.0 - 45.0 mmHg   pO2, Arterial 69.4 (L) 80.0 - 100.0 mmHg   Bicarbonate 33.6 (H) 20.0 - 24.0 mEq/L   Acid-Base Excess 10.1 (H) 0.0 - 2.0 mmol/L   O2 Saturation 94.7 %   Collection site LEFT RADIAL    Drawn by [id number]    Sample type ARTHROGRAPHIS SPECIES    Allens test (pass/fail) PASS PASS  CBC     Status: Abnormal   Collection Time: 05/25/15  1:01 PM  Result Value Ref Range   WBC 7.5 4.0 - 10.5 K/uL   RBC 2.62 (L) 3.87 - 5.11 MIL/uL   Hemoglobin 7.5 (L) 12.0 - 15.0 g/dL  HCT 23.5 (L) 36.0 - 46.0 %   MCV 89.7 78.0 - 100.0 fL   MCH 28.6 26.0 - 34.0 pg   MCHC 31.9 30.0 - 36.0 g/dL   RDW 16.2 (H) 11.5 - 15.5 %   Platelets 161 150 - 400 K/uL  Blood gas, arterial     Status: Abnormal   Collection Time: 05/26/15  4:50 AM  Result Value Ref Range   FIO2 35.00    Delivery systems BILEVEL POSITIVE AIRWAY PRESSURE    Inspiratory PAP 12.0    Expiratory PAP 8.0    pH, Arterial 7.495 (H) 7.350 - 7.450   pCO2 arterial 46.2 (H) 35.0 - 45.0 mmHg   pO2, Arterial 66.1 (L) 80.0 - 100.0 mmHg   Bicarbonate 34.8 (H) 20.0 - 24.0 mEq/L   TCO2 10.0 0 - 100 mmol/L   Acid-Base Excess 11.3 (H) 0.0 - 2.0 mmol/L   Collection site RIGHT RADIAL    Drawn by [id number]    Sample type ARTERIAL    Allens test (pass/fail) PASS PASS  CBC     Status: Abnormal   Collection Time: 05/26/15  5:19 AM   Result Value Ref Range   WBC 4.1 4.0 - 10.5 K/uL   RBC 2.72 (L) 3.87 - 5.11 MIL/uL   Hemoglobin 7.7 (L) 12.0 - 15.0 g/dL   HCT 24.5 (L) 36.0 - 46.0 %   MCV 90.1 78.0 - 100.0 fL   MCH 28.3 26.0 - 34.0 pg   MCHC 31.4 30.0 - 36.0 g/dL   RDW 15.8 (H) 11.5 - 15.5 %   Platelets 157 150 - 400 K/uL  Basic metabolic panel     Status: Abnormal   Collection Time: 05/26/15  5:19 AM  Result Value Ref Range   Sodium 143 135 - 145 mmol/L   Potassium 3.3 (L) 3.5 - 5.1 mmol/L   Chloride 98 (L) 101 - 111 mmol/L   CO2 35 (H) 22 - 32 mmol/L   Glucose, Bld 122 (H) 65 - 99 mg/dL   BUN 35 (H) 6 - 20 mg/dL   Creatinine, Ser 1.39 (H) 0.44 - 1.00 mg/dL   Calcium 8.3 (L) 8.9 - 10.3 mg/dL   GFR calc non Af Amer 37 (L) >60 mL/min   GFR calc Af Amer 42 (L) >60 mL/min    Comment: (NOTE) The eGFR has been calculated using the CKD EPI equation. This calculation has not been validated in all clinical situations. eGFR's persistently <60 mL/min signify possible Chronic Kidney Disease.    Anion gap 10 5 - 15  Basic metabolic panel     Status: Abnormal   Collection Time: 05/27/15  4:53 AM  Result Value Ref Range   Sodium 139 135 - 145 mmol/L   Potassium 2.9 (L) 3.5 - 5.1 mmol/L   Chloride 97 (L) 101 - 111 mmol/L   CO2 33 (H) 22 - 32 mmol/L   Glucose, Bld 148 (H) 65 - 99 mg/dL   BUN 50 (H) 6 - 20 mg/dL   Creatinine, Ser 1.85 (H) 0.44 - 1.00 mg/dL   Calcium 8.3 (L) 8.9 - 10.3 mg/dL   GFR calc non Af Amer 26 (L) >60 mL/min   GFR calc Af Amer 30 (L) >60 mL/min    Comment: (NOTE) The eGFR has been calculated using the CKD EPI equation. This calculation has not been validated in all clinical situations. eGFR's persistently <60 mL/min signify possible Chronic Kidney Disease.    Anion gap 9 5 - 15  ABGS  Recent Labs  05/26/15 0450  PHART 7.495*  PO2ART 66.1*  TCO2 10.0  HCO3 34.8*   CULTURES Recent Results (from the past 240 hour(s))  MRSA PCR Screening     Status: Abnormal   Collection Time:  05/25/15 11:15 AM  Result Value Ref Range Status   MRSA by PCR POSITIVE (A) NEGATIVE Final    Comment:        The GeneXpert MRSA Assay (FDA approved for NASAL specimens only), is one component of a comprehensive MRSA colonization surveillance program. It is not intended to diagnose MRSA infection nor to guide or monitor treatment for MRSA infections. RESULT CALLED TO, READ BACK BY AND VERIFIED WITH: SPANGLER,E. AT 0160 ON 05/25/2015 BY BAUGHAM,M.    Studies/Results: Dg Chest Port 1 View  05/26/2015   CLINICAL DATA:  Respiratory failure.  EXAM: PORTABLE CHEST - 1 VIEW  COMPARISON:  05/25/2015.  FINDINGS: Prior cardiac valve replacement. Left atrial appendage clip noted. Stable cardiomegaly. Diffuse multifocal bilateral pulmonary infiltrates particularly prominent in the right upper lobe again noted. Small bilateral pleural effusions. No pneumothorax.  IMPRESSION: 1. Diffuse multifocal bilateral pulmonary infiltrates particularly prominent in the right upper lobe again noted. No significant interim change. 2. Prior cardiac valve replacement.  Stable cardiomegaly.   Electronically Signed   By: Marcello Moores  Register   On: 05/26/2015 08:02   Dg Chest Port 1 View  05/25/2015   CLINICAL DATA:  Respiratory distress  EXAM: 05/22/2015  COMPARISON:  None.  FINDINGS: Severe bilateral multifocal infiltrates diffusely worse. Particularly increased severity lateral right lower lobe and diffusely throughout the right upper lobe. Stable small left pleural effusions. Stable cardiac enlargement, mild.  IMPRESSION: Extensive bilateral infiltrates diffusely worse particularly right upper lobe.   Electronically Signed   By: Skipper Cliche M.D.   On: 05/25/2015 09:23    Medications:  Prior to Admission:  Prescriptions prior to admission  Medication Sig Dispense Refill Last Dose  . albuterol (PROAIR HFA) 108 (90 BASE) MCG/ACT inhaler Inhale 2 puffs into the lungs 4 (four) times daily as needed for wheezing or  shortness of breath.   05/22/2015 at Unknown time  . albuterol (PROVENTIL) (2.5 MG/3ML) 0.083% nebulizer solution Take 3 mLs (2.5 mg total) by nebulization every 4 (four) hours as needed for wheezing or shortness of breath. 75 mL 12 05/22/2015 at Unknown time  . amiodarone (PACERONE) 200 MG tablet Take 200 mg by mouth daily.   05/22/2015 at Unknown time  . budesonide (PULMICORT) 0.25 MG/2ML nebulizer solution Take 2 mLs (0.25 mg total) by nebulization 2 (two) times daily. 60 mL 11 05/22/2015 at Unknown time  . dabigatran (PRADAXA) 150 MG CAPS capsule Take 1 capsule (150 mg total) by mouth 2 (two) times daily. 60 capsule 6 05/22/2015 at Unknown time  . diltiazem (CARDIZEM) 30 MG tablet Take 1 tablet (30 mg total) by mouth 2 (two) times daily. 60 tablet 2 05/22/2015 at Unknown time  . Fluticasone Propionate, Inhal, (FLOVENT DISKUS) 250 MCG/BLIST AEPB Inhale 1 puff into the lungs 2 (two) times daily. 60 each 3 Past Week at Unknown time  . magnesium oxide (MAG-OX) 400 MG tablet Take 400 mg by mouth 2 (two) times daily.   05/22/2015 at Unknown time  . potassium chloride (K-DUR) 10 MEQ tablet Take 2 tablets (20 mEq total) by mouth daily. 60 tablet 6 05/22/2015 at Unknown time  . torsemide (DEMADEX) 20 MG tablet Take 2 tablets (40 mg total) by mouth AC breakfast. (Patient taking differently: Take 20  mg by mouth 2 (two) times daily. ) 90 tablet 3 05/22/2015 at Unknown time  . traMADol (ULTRAM) 50 MG tablet Take 1 tablet (50 mg total) by mouth every 6 (six) hours as needed (pain). 50 tablet 0 05/22/2015 at Unknown time  . docusate sodium (COLACE) 100 MG capsule Take 1 capsule (100 mg total) by mouth daily as needed for mild constipation. (Patient not taking: Reported on 05/22/2015) 30 capsule 1   . torsemide (DEMADEX) 20 MG tablet Take 1 tablet (20 mg total) by mouth daily after supper. (Patient not taking: Reported on 05/22/2015) 30 tablet 0 Taking   Scheduled: . aztreonam  2 g Intravenous 3 times per day  . budesonide   0.25 mg Nebulization BID  . Chlorhexidine Gluconate Cloth  6 each Topical Q0600  . dabigatran  150 mg Oral BID  . diltiazem  60 mg Oral 3 times per day  . furosemide  80 mg Intravenous TID  . levalbuterol  1.25 mg Nebulization Q4H  . magnesium oxide  400 mg Oral BID  . methylPREDNISolone (SOLU-MEDROL) injection  40 mg Intravenous Q6H  . montelukast  10 mg Oral QHS  . mupirocin ointment  1 application Nasal BID  . potassium chloride  20 mEq Oral Daily  . vancomycin  1,250 mg Intravenous Q24H   Continuous:  HAF:[id number], benzocaine, traMADol  Assesment: She has acute on chronic diastolic heart failure. Despite having diuresis she became much worse and appeared to develop pulmonary edema. She also had pneumonia which is healthcare associated. She developed acute on chronic hypoxic respiratory failure requiring BiPAP and that has improved. 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 She seems to be improving.    LOS: 4 days   Waqas Bruhl L 05/27/2015, 8:18 AM

## 2015-05-28 LAB — BASIC METABOLIC PANEL
Anion gap: 9 (ref 5–15)
BUN: 59 mg/dL — AB (ref 6–20)
CALCIUM: 8.7 mg/dL — AB (ref 8.9–10.3)
CO2: 32 mmol/L (ref 22–32)
Chloride: 98 mmol/L — ABNORMAL LOW (ref 101–111)
Creatinine, Ser: 1.55 mg/dL — ABNORMAL HIGH (ref 0.44–1.00)
GFR calc Af Amer: 37 mL/min — ABNORMAL LOW (ref >60)
GFR, EST NON AFRICAN AMERICAN: 32 mL/min — AB (ref >60)
GLUCOSE: 138 mg/dL — AB (ref 65–99)
Potassium: 4.1 mmol/L (ref 3.5–5.1)
Sodium: 139 mmol/L (ref 135–145)

## 2015-05-28 LAB — CBC
HCT: 25.8 % — ABNORMAL LOW (ref 36.0–46.0)
Hemoglobin: 8 g/dL — ABNORMAL LOW (ref 12.0–15.0)
MCH: 27.7 pg (ref 26.0–34.0)
MCHC: 31 g/dL (ref 30.0–36.0)
MCV: 89.3 fL (ref 78.0–100.0)
Platelets: 230 10*3/uL (ref 150–400)
RBC: 2.89 MIL/uL — ABNORMAL LOW (ref 3.87–5.11)
RDW: 15.7 % — AB (ref 11.5–15.5)
WBC: 11.7 10*3/uL — ABNORMAL HIGH (ref 4.0–10.5)

## 2015-05-28 MED ORDER — FUROSEMIDE 10 MG/ML IJ SOLN
40.0000 mg | Freq: Two times a day (BID) | INTRAMUSCULAR | Status: DC
  Administered 2015-05-28 – 2015-05-30 (×5): 40 mg via INTRAVENOUS
  Filled 2015-05-28 (×5): qty 4

## 2015-05-28 NOTE — Progress Notes (Signed)
Subjective: She says she feels better. She has not had to use BiPAP for the last 2 nights. She does however require oxygen by mask periodically.  Objective: Vital signs in last 24 hours: Temp:  [97.3 F (36.3 C)-98.4 F (36.9 C)] 97.8 F (36.6 C) (09/24 0740) Pulse Rate:  [71-109] 109 (09/24 0555) Resp:  [17-32] 23 (09/24 0555) BP: (129-149)/(49-63) 144/61 mmHg (09/24 0555) SpO2:  [89 %-100 %] 93 % (09/24 0911) FiO2 (%):  [40 %-55 %] 40 % (09/24 0555) Weight:  [96 kg (211 lb 10.3 oz)] 96 kg (211 lb 10.3 oz) (09/24 0400) Weight change: 0.9 kg (1 lb 15.8 oz) Last BM Date: 05/27/15  Intake/Output from previous day: 09/23 0701 - 09/24 0700 In: 340 [P.O.:240; IV Piggyback:100] Out: 1375 [Urine:1375]  PHYSICAL EXAM General appearance: alert, cooperative and mild distress Resp: rhonchi bilaterally Cardio: irregularly irregular rhythm GI: soft, non-tender; bowel sounds normal; no masses,  no organomegaly Extremities: extremities normal, atraumatic, no cyanosis or edema  Lab Results:  Results for orders placed or performed during the hospital encounter of 05/22/15 (from the past 48 hour(s))  Basic metabolic panel     Status: Abnormal   Collection Time: 05/27/15  4:53 AM  Result Value Ref Range   Sodium 139 135 - 145 mmol/L   Potassium 2.9 (L) 3.5 - 5.1 mmol/L   Chloride 97 (L) 101 - 111 mmol/L   CO2 33 (H) 22 - 32 mmol/L   Glucose, Bld 148 (H) 65 - 99 mg/dL   BUN 50 (H) 6 - 20 mg/dL   Creatinine, Ser 1.85 (H) 0.44 - 1.00 mg/dL   Calcium 8.3 (L) 8.9 - 10.3 mg/dL   GFR calc non Af Amer 26 (L) >60 mL/min   GFR calc Af Amer 30 (L) >60 mL/min    Comment: (NOTE) The eGFR has been calculated using the CKD EPI equation. This calculation has not been validated in all clinical situations. eGFR's persistently <60 mL/min signify possible Chronic Kidney Disease.    Anion gap 9 5 - 15  CBC     Status: Abnormal   Collection Time: 05/27/15  9:36 AM  Result Value Ref Range   WBC 13.0  (H) 4.0 - 10.5 K/uL   RBC 2.99 (L) 3.87 - 5.11 MIL/uL   Hemoglobin 8.4 (L) 12.0 - 15.0 g/dL   HCT 26.7 (L) 36.0 - 46.0 %   MCV 89.3 78.0 - 100.0 fL   MCH 28.1 26.0 - 34.0 pg   MCHC 31.5 30.0 - 36.0 g/dL   RDW 15.7 (H) 11.5 - 15.5 %   Platelets 227 150 - 400 K/uL  Magnesium     Status: None   Collection Time: 05/27/15  9:36 AM  Result Value Ref Range   Magnesium 2.0 1.7 - 2.4 mg/dL  Basic metabolic panel     Status: Abnormal   Collection Time: 05/28/15  4:48 AM  Result Value Ref Range   Sodium 139 135 - 145 mmol/L   Potassium 4.1 3.5 - 5.1 mmol/L    Comment: DELTA CHECK NOTED   Chloride 98 (L) 101 - 111 mmol/L   CO2 32 22 - 32 mmol/L   Glucose, Bld 138 (H) 65 - 99 mg/dL   BUN 59 (H) 6 - 20 mg/dL   Creatinine, Ser 1.55 (H) 0.44 - 1.00 mg/dL   Calcium 8.7 (L) 8.9 - 10.3 mg/dL   GFR calc non Af Amer 32 (L) >60 mL/min   GFR calc Af Amer 37 (L) >60 mL/min  Comment: (NOTE) The eGFR has been calculated using the CKD EPI equation. This calculation has not been validated in all clinical situations. eGFR's persistently <60 mL/min signify possible Chronic Kidney Disease.    Anion gap 9 5 - 15  CBC     Status: Abnormal   Collection Time: 05/28/15  4:48 AM  Result Value Ref Range   WBC 11.7 (H) 4.0 - 10.5 K/uL   RBC 2.89 (L) 3.87 - 5.11 MIL/uL   Hemoglobin 8.0 (L) 12.0 - 15.0 g/dL   HCT 25.8 (L) 36.0 - 46.0 %   MCV 89.3 78.0 - 100.0 fL   MCH 27.7 26.0 - 34.0 pg   MCHC 31.0 30.0 - 36.0 g/dL   RDW 15.7 (H) 11.5 - 15.5 %   Platelets 230 150 - 400 K/uL    ABGS  Recent Labs  05/26/15 0450  PHART 7.495*  PO2ART 66.1*  TCO2 10.0  HCO3 34.8*   CULTURES Recent Results (from the past 240 hour(s))  MRSA PCR Screening     Status: Abnormal   Collection Time: 05/25/15 11:15 AM  Result Value Ref Range Status   MRSA by PCR POSITIVE (A) NEGATIVE Final    Comment:        The GeneXpert MRSA Assay (FDA approved for NASAL specimens only), is one component of a comprehensive MRSA  colonization surveillance program. It is not intended to diagnose MRSA infection nor to guide or monitor treatment for MRSA infections. RESULT CALLED TO, READ BACK BY AND VERIFIED WITH: SPANGLER,E. AT 1618 ON 05/25/2015 BY BAUGHAM,M.    Studies/Results: No results found.  Medications:  Prior to Admission:  Prescriptions prior to admission  Medication Sig Dispense Refill Last Dose  . albuterol (PROAIR HFA) 108 (90 BASE) MCG/ACT inhaler Inhale 2 puffs into the lungs 4 (four) times daily as needed for wheezing or shortness of breath.   05/22/2015 at Unknown time  . albuterol (PROVENTIL) (2.5 MG/3ML) 0.083% nebulizer solution Take 3 mLs (2.5 mg total) by nebulization every 4 (four) hours as needed for wheezing or shortness of breath. 75 mL 12 05/22/2015 at Unknown time  . amiodarone (PACERONE) 200 MG tablet Take 200 mg by mouth daily.   05/22/2015 at Unknown time  . budesonide (PULMICORT) 0.25 MG/2ML nebulizer solution Take 2 mLs (0.25 mg total) by nebulization 2 (two) times daily. 60 mL 11 05/22/2015 at Unknown time  . dabigatran (PRADAXA) 150 MG CAPS capsule Take 1 capsule (150 mg total) by mouth 2 (two) times daily. 60 capsule 6 05/22/2015 at Unknown time  . diltiazem (CARDIZEM) 30 MG tablet Take 1 tablet (30 mg total) by mouth 2 (two) times daily. 60 tablet 2 05/22/2015 at Unknown time  . Fluticasone Propionate, Inhal, (FLOVENT DISKUS) 250 MCG/BLIST AEPB Inhale 1 puff into the lungs 2 (two) times daily. 60 each 3 Past Week at Unknown time  . magnesium oxide (MAG-OX) 400 MG tablet Take 400 mg by mouth 2 (two) times daily.   05/22/2015 at Unknown time  . potassium chloride (K-DUR) 10 MEQ tablet Take 2 tablets (20 mEq total) by mouth daily. 60 tablet 6 05/22/2015 at Unknown time  . torsemide (DEMADEX) 20 MG tablet Take 2 tablets (40 mg total) by mouth AC breakfast. (Patient taking differently: Take 20 mg by mouth 2 (two) times daily. ) 90 tablet 3 05/22/2015 at Unknown time  . traMADol (ULTRAM) 50 MG  tablet Take 1 tablet (50 mg total) by mouth every 6 (six) hours as needed (pain). 50 tablet 0 05/22/2015  at Unknown time  . docusate sodium (COLACE) 100 MG capsule Take 1 capsule (100 mg total) by mouth daily as needed for mild constipation. (Patient not taking: Reported on 05/22/2015) 30 capsule 1   . torsemide (DEMADEX) 20 MG tablet Take 1 tablet (20 mg total) by mouth daily after supper. (Patient not taking: Reported on 05/22/2015) 30 tablet 0 Taking   Scheduled: . aztreonam  2 g Intravenous 3 times per day  . budesonide  0.25 mg Nebulization BID  . Chlorhexidine Gluconate Cloth  6 each Topical Q0600  . dabigatran  150 mg Oral BID  . diltiazem  60 mg Oral 3 times per day  . levalbuterol  1.25 mg Nebulization Q4H  . [START ON 05/29/2015] levofloxacin (LEVAQUIN) IV  500 mg Intravenous Q48H  . levofloxacin (LEVAQUIN) IV  750 mg Intravenous Once  . magnesium oxide  400 mg Oral BID  . methylPREDNISolone (SOLU-MEDROL) injection  40 mg Intravenous Q6H  . montelukast  10 mg Oral QHS  . mupirocin ointment  1 application Nasal BID  . potassium chloride  20 mEq Oral Daily   Continuous:  VUY:EBXIDHWYS, benzocaine, traMADol  Assesment: She was admitted with acute on chronic diastolic heart failure and developed increasing respiratory distress and had acute on chronic hypoxic respiratory failure. She has pneumonia which is improving and she is improving in general. We discussed intubation and mechanical ventilation and she does not want that. 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    LOS: 5 days   HAWKINS,EDWARD L 05/28/2015, 9:25 AM

## 2015-05-28 NOTE — Progress Notes (Signed)
[facility] PROGRESS NOTE  Ahmaya Ostermiller EAV:[id number] DOB: Feb 01, 1941 DOA: 05/22/2015 PCP: [name], MD  Assessment/Plan:  Acute on chronic diastolic CHF exacerbation. On IV Lasix with continued good urine output. Volume status continues to improve. Per cardiology, due to elevated creatinine and BUN IV lasix was held.9/23. Will restart lasix today at  BID.   Acute on chronic hypoxic respiratory failure on 2.5L at home, related to CHF exacerbation/ HCAP. Patient's breathing had decompensated requiring BiPAP however now she is clinically improved and back on nasal cannula but is intermittently requiring venti-mask. Pulmonology consulted and agrees with current treatment plan. Since she continues to improve, and is currently stable on 4L [place] today, will continue to wean off O2 as tolerated.  Lobar Pneumonia. Patient found to have infiltrates in the right upper lobe. Continue broad spectrum abx. She remains afebrile, WBC trending down, 11.7 today.   AKI superimposed on CKD stage III. Likely related to diuresis with Lasix and possibly Vancomycin. Creatinine improving today and continues to have good UOP. Will continue to hold vancomycin, but will restart lasix today and follow renal function. Creatinine 1.55/BUN 59 today.    Bilateral lower extremity edema, likely related to CHF. Continues to improve.   Hyperglycemia. Glucose mildly elevated, yet trending down. Will continue to monitor.  Asthma exacerbation,stable. Will continue nebs and IV steroids. Slowly improving.   Chronic atrial fibrillation, rate controlled with Cardizem and anticoagulated with Pradaxa. Cardiology consulted and agrees with current management.    Severe aortic stenosis s/p AVR with bioprosthetic valve May 2016, asymptomatic  Tongue lesion. Worrisome of malignancy, recommend outpatient f/u with ENT.  Hemoptysis/epistasis. Patient reports improvement of hemoptysis. There were also reports of nose bleeds which  have resolved. Will continue Pradaxa.  Hypokalemia, likely related to lasix. Repleted.   Anemia, chronic. Hemoglobin appears to be near baseline and remains stable. Continue to follow.  Code Status:DNR DVT prophylaxis: SCDs Family Communication:No family at bedside. Discussed with patient who understands and has no concerns at this time. Disposition Plan: Move to medical bed within 24 hours  Consultants:  Cardiology-Jonathan Margy Clarks, MD  Pulmonology- Kari Baars, MD  Procedures:    Antibiotics:  Vancomycin 9/21>>9/23  Aztreonam 9/21>>  Levaquin 9/23>>  HPI/Subjective: Feeling better. Admits productive cough.   Objective: Filed Vitals:   05/28/15 0555  BP: 144/61  Pulse: 109  Temp:   Resp: 23    Intake/Output Summary (Last 24 hours) at 05/28/15 0635 Last data filed at 05/28/15 0600  Gross per 24 hour  Intake    240 ml  Output   1375 ml  Net  -1135 ml   Filed Weights   05/26/15 0500 05/27/15 0500 05/28/15 0400  Weight: 93.3 kg (205 lb 11 oz) 95.1 kg (209 lb 10.5 oz) 96 kg (211 lb 10.3 oz)    Exam:   General: NAD, looks comfortable. Sitting up in bed taking breathing treatment.   Cardiovascular: irregular rate and regular rhythm, S1, S2   Respiratory: Mild wheezing bilaterally , rales or rhnochi  Abdomen: soft, non tender, no distention , bowel sounds normal  Musculoskeletal: Trace edema b/l  Data Reviewed: Basic Metabolic Panel:  Recent Labs Lab 05/23/15 0708 05/24/15 0628 05/25/15 0639 05/26/15 0519 05/27/15 0453 05/27/15 0936 05/28/15 0448  NA 138 139 139 143 139  --  139  K 4.6 4.1 3.9 3.3* 2.9*  --  4.1  CL 99* 100* 99* 98* 97*  --  98*  CO2 32 32 32 35* 33*  --  32  GLUCOSE 145* 123* 109* 122* 148*  --  138*  BUN 26* 36* 37* 35* 50*  --  59*  CREATININE 1.63* 1.58* 1.45* 1.39* 1.85*  --  1.55*  CALCIUM 8.6* 8.7* 8.8* 8.3* 8.3*  --  8.7*  MG 3.0*  --   --   --   --  2.0  --    Liver Function Tests:  Recent Labs Lab  05/22/15 1905 05/23/15 0708  AST 27 27  ALT 14 17  ALKPHOS 92 93  BILITOT 0.7 0.7  PROT 7.3 7.6  ALBUMIN 3.6 3.6  CBC:  Recent Labs Lab 05/22/15 1905 05/23/15 0708 05/25/15 1301 05/26/15 0519 05/27/15 0936 05/28/15 0448  WBC 6.7 5.4 7.5 4.1 13.0* 11.7*  NEUTROABS 5.4  --   --   --   --   --   HGB 8.7* 8.7* 7.5* 7.7* 8.4* 8.0*  HCT 27.4* 28.0* 23.5* 24.5* 26.7* 25.8*  MCV 90.4 90.0 89.7 90.1 89.3 89.3  PLT 155 147* 161 157 227 230     Recent Labs  02/07/15 0520 02/12/15 0435 03/12/15 2156  BNP 204.8* 271.9* 178.0*    Studies: No results found.  Scheduled Meds: . aztreonam  2 g Intravenous 3 times per day  . budesonide  0.25 mg Nebulization BID  . Chlorhexidine Gluconate Cloth  6 each Topical Q0600  . dabigatran  150 mg Oral BID  . diltiazem  60 mg Oral 3 times per day  . levalbuterol  1.25 mg Nebulization Q4H  . [START ON 05/29/2015] levofloxacin (LEVAQUIN) IV  500 mg Intravenous Q48H  . levofloxacin (LEVAQUIN) IV  750 mg Intravenous Once  . magnesium oxide  400 mg Oral BID  . methylPREDNISolone (SOLU-MEDROL) injection  40 mg Intravenous Q6H  . montelukast  10 mg Oral QHS  . mupirocin ointment  1 application Nasal BID  . 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 If 7PM-7AM, please contact night-coverage at [email or web address], password [facility] 05/28/2015, 6:35 AM  LOS: 5 days      By signing my name below, I, Zadie Cleverly, attest that this documentation has been prepared under the direction and in the presence of Erick Blinks, MD. Electronically signed: Zadie Cleverly, Scribe. 05/28/2015 9:19 AM   I, Dr. Erick Blinks, personally performed the services described in this documentaiton. All medical record entries made by the scribe were at my direction and in my presence. I have reviewed the chart and agree that the record reflects my personal performance and is accurate and complete  Erick Blinks, MD, 05/28/2015 6:35 AM

## 2015-05-28 NOTE — Progress Notes (Signed)
Patient appear better, asked her to not use oxygen by mask if she could do so. She did have few wheezes first round but barely audible. Patient was given flutter valve. Oxygen by mask last night was by her request not because saturation indicated need. Will try to decrease treatments to q6 after midnight. Will give prn if needed.

## 2015-05-28 NOTE — Plan of Care (Signed)
Problem: Phase I Progression Outcomes Goal: Dyspnea controlled at rest (HF) Outcome: Not Progressing Pt continues to have episodes of sob while in bed,requiring her to go from O2 via [place]@4l /min  To venti mask @50 % Goal: Up in chair, BRP Outcome: Completed/Met Date Met:  05/28/15 PT UP TO BSC THEN SITTING IN RECLINER THIS AM. C/O OF WEAKNESS, BUT ONLY MODERATE SOB.

## 2015-05-28 NOTE — Progress Notes (Signed)
   05/28/15 0900 05/28/15 0911  Oxygen Therapy  SpO2 92 % 93 %  O2 Device Venturi Mask Nasal Cannula  PT IS ALTERNATING BETWEEN NASAL CANULA AND VENTI MASK. WHEN SHE BECOMES SOB SHE  CHANGES BACK TO VENTI MASK

## 2015-05-29 ENCOUNTER — Inpatient Hospital Stay (HOSPITAL_COMMUNITY): Payer: Medicare Other

## 2015-05-29 LAB — BASIC METABOLIC PANEL
ANION GAP: 8 (ref 5–15)
BUN: 60 mg/dL — ABNORMAL HIGH (ref 6–20)
CHLORIDE: 100 mmol/L — AB (ref 101–111)
CO2: 30 mmol/L (ref 22–32)
Calcium: 8.5 mg/dL — ABNORMAL LOW (ref 8.9–10.3)
Creatinine, Ser: 1.53 mg/dL — ABNORMAL HIGH (ref 0.44–1.00)
GFR calc Af Amer: 38 mL/min — ABNORMAL LOW (ref >60)
GFR calc non Af Amer: 33 mL/min — ABNORMAL LOW (ref >60)
GLUCOSE: 180 mg/dL — AB (ref 65–99)
POTASSIUM: 3.9 mmol/L (ref 3.5–5.1)
Sodium: 138 mmol/L (ref 135–145)

## 2015-05-29 LAB — CBC
HEMATOCRIT: 24.8 % — AB (ref 36.0–46.0)
Hemoglobin: 7.9 g/dL — ABNORMAL LOW (ref 12.0–15.0)
MCH: 28.5 pg (ref 26.0–34.0)
MCHC: 31.9 g/dL (ref 30.0–36.0)
MCV: 89.5 fL (ref 78.0–100.0)
Platelets: 221 10*3/uL (ref 150–400)
RBC: 2.77 MIL/uL — AB (ref 3.87–5.11)
RDW: 15.8 % — ABNORMAL HIGH (ref 11.5–15.5)
WBC: 12.6 10*3/uL — ABNORMAL HIGH (ref 4.0–10.5)

## 2015-05-29 MED ORDER — LEVALBUTEROL HCL 1.25 MG/0.5ML IN NEBU
1.2500 mg | INHALATION_SOLUTION | Freq: Four times a day (QID) | RESPIRATORY_TRACT | Status: DC
  Administered 2015-05-29 – 2015-05-30 (×5): 1.25 mg via RESPIRATORY_TRACT
  Filled 2015-05-29 (×5): qty 0.5

## 2015-05-29 NOTE — Progress Notes (Signed)
Called report to Feliberto Harts, RN on dept 300.  Verbalized understanding. Pt transferred to room 331 in safe and stable condition. Schonewitz, Candelaria Stagers 05/29/2015

## 2015-05-29 NOTE — Progress Notes (Signed)
Subjective: She continues to improve. She has no new complaints. She did not require BiPAP last night again. She is on nasal oxygen this morning.  Objective: Vital signs in last 24 hours: Temp:  [97.5 F (36.4 C)-98.6 F (37 C)] 97.5 F (36.4 C) (09/25 0746) Pulse Rate:  [63-81] 78 (09/25 0633) Resp:  [15-26] 25 (09/25 8676) BP: (122-141)/(30-57) 136/57 mmHg (09/25 0633) SpO2:  [90 %-99 %] 94 % (09/25 1950) Weight:  [94.6 kg (208 lb 8.9 oz)] 94.6 kg (208 lb 8.9 oz) (09/25 0500) Weight change: -1.4 kg (-3 lb 1.4 oz) Last BM Date: 05/28/15  Intake/Output from previous day: 09/24 0701 - 09/25 0700 In: 940 [P.O.:840; IV Piggyback:100] Out: 1975 [Urine:1975]  PHYSICAL EXAM General appearance: alert, cooperative and no distress Resp: rhonchi bilaterally Cardio: irregularly irregular rhythm GI: soft, non-tender; bowel sounds normal; no masses,  no organomegaly Extremities: Her edema looks better  Lab Results:  Results for orders placed or performed during the hospital encounter of 05/22/15 (from the past 48 hour(s))  CBC     Status: Abnormal   Collection Time: 05/27/15  9:36 AM  Result Value Ref Range   WBC 13.0 (H) 4.0 - 10.5 K/uL   RBC 2.99 (L) 3.87 - 5.11 MIL/uL   Hemoglobin 8.4 (L) 12.0 - 15.0 g/dL   HCT 26.7 (L) 36.0 - 46.0 %   MCV 89.3 78.0 - 100.0 fL   MCH 28.1 26.0 - 34.0 pg   MCHC 31.5 30.0 - 36.0 g/dL   RDW 15.7 (H) 11.5 - 15.5 %   Platelets 227 150 - 400 K/uL  Magnesium     Status: None   Collection Time: 05/27/15  9:36 AM  Result Value Ref Range   Magnesium 2.0 1.7 - 2.4 mg/dL  Basic metabolic panel     Status: Abnormal   Collection Time: 05/28/15  4:48 AM  Result Value Ref Range   Sodium 139 135 - 145 mmol/L   Potassium 4.1 3.5 - 5.1 mmol/L    Comment: DELTA CHECK NOTED   Chloride 98 (L) 101 - 111 mmol/L   CO2 32 22 - 32 mmol/L   Glucose, Bld 138 (H) 65 - 99 mg/dL   BUN 59 (H) 6 - 20 mg/dL   Creatinine, Ser 1.55 (H) 0.44 - 1.00 mg/dL   Calcium 8.7 (L)  8.9 - 10.3 mg/dL   GFR calc non Af Amer 32 (L) >60 mL/min   GFR calc Af Amer 37 (L) >60 mL/min    Comment: (NOTE) The eGFR has been calculated using the CKD EPI equation. This calculation has not been validated in all clinical situations. eGFR's persistently <60 mL/min signify possible Chronic Kidney Disease.    Anion gap 9 5 - 15  CBC     Status: Abnormal   Collection Time: 05/28/15  4:48 AM  Result Value Ref Range   WBC 11.7 (H) 4.0 - 10.5 K/uL   RBC 2.89 (L) 3.87 - 5.11 MIL/uL   Hemoglobin 8.0 (L) 12.0 - 15.0 g/dL   HCT 25.8 (L) 36.0 - 46.0 %   MCV 89.3 78.0 - 100.0 fL   MCH 27.7 26.0 - 34.0 pg   MCHC 31.0 30.0 - 36.0 g/dL   RDW 15.7 (H) 11.5 - 15.5 %   Platelets 230 150 - 400 K/uL  CBC     Status: Abnormal   Collection Time: 05/29/15  5:31 AM  Result Value Ref Range   WBC 12.6 (H) 4.0 - 10.5 K/uL   RBC  2.77 (L) 3.87 - 5.11 MIL/uL   Hemoglobin 7.9 (L) 12.0 - 15.0 g/dL   HCT 24.8 (L) 36.0 - 46.0 %   MCV 89.5 78.0 - 100.0 fL   MCH 28.5 26.0 - 34.0 pg   MCHC 31.9 30.0 - 36.0 g/dL   RDW 15.8 (H) 11.5 - 15.5 %   Platelets 221 150 - 400 K/uL  Basic metabolic panel     Status: Abnormal   Collection Time: 05/29/15  5:31 AM  Result Value Ref Range   Sodium 138 135 - 145 mmol/L   Potassium 3.9 3.5 - 5.1 mmol/L   Chloride 100 (L) 101 - 111 mmol/L   CO2 30 22 - 32 mmol/L   Glucose, Bld 180 (H) 65 - 99 mg/dL   BUN 60 (H) 6 - 20 mg/dL   Creatinine, Ser 1.53 (H) 0.44 - 1.00 mg/dL   Calcium 8.5 (L) 8.9 - 10.3 mg/dL   GFR calc non Af Amer 33 (L) >60 mL/min   GFR calc Af Amer 38 (L) >60 mL/min    Comment: (NOTE) The eGFR has been calculated using the CKD EPI equation. This calculation has not been validated in all clinical situations. eGFR's persistently <60 mL/min signify possible Chronic Kidney Disease.    Anion gap 8 5 - 15    ABGS No results for input(s): PHART, PO2ART, TCO2, HCO3 in the last 72 hours.  Invalid input(s): PCO2 CULTURES Recent Results (from the past  240 hour(s))  MRSA PCR Screening     Status: Abnormal   Collection Time: 05/25/15 11:15 AM  Result Value Ref Range Status   MRSA by PCR POSITIVE (A) NEGATIVE Final    Comment:        The GeneXpert MRSA Assay (FDA approved for NASAL specimens only), is one component of a comprehensive MRSA colonization surveillance program. It is not intended to diagnose MRSA infection nor to guide or monitor treatment for MRSA infections. RESULT CALLED TO, READ BACK BY AND VERIFIED WITH: SPANGLER,E. AT 1618 ON 05/25/2015 BY BAUGHAM,M.    Studies/Results: No results found.  Medications:  Prior to Admission:  Prescriptions prior to admission  Medication Sig Dispense Refill Last Dose  . albuterol (PROAIR HFA) 108 (90 BASE) MCG/ACT inhaler Inhale 2 puffs into the lungs 4 (four) times daily as needed for wheezing or shortness of breath.   05/22/2015 at Unknown time  . albuterol (PROVENTIL) (2.5 MG/3ML) 0.083% nebulizer solution Take 3 mLs (2.5 mg total) by nebulization every 4 (four) hours as needed for wheezing or shortness of breath. 75 mL 12 05/22/2015 at Unknown time  . amiodarone (PACERONE) 200 MG tablet Take 200 mg by mouth daily.   05/22/2015 at Unknown time  . budesonide (PULMICORT) 0.25 MG/2ML nebulizer solution Take 2 mLs (0.25 mg total) by nebulization 2 (two) times daily. 60 mL 11 05/22/2015 at Unknown time  . dabigatran (PRADAXA) 150 MG CAPS capsule Take 1 capsule (150 mg total) by mouth 2 (two) times daily. 60 capsule 6 05/22/2015 at Unknown time  . diltiazem (CARDIZEM) 30 MG tablet Take 1 tablet (30 mg total) by mouth 2 (two) times daily. 60 tablet 2 05/22/2015 at Unknown time  . Fluticasone Propionate, Inhal, (FLOVENT DISKUS) 250 MCG/BLIST AEPB Inhale 1 puff into the lungs 2 (two) times daily. 60 each 3 Past Week at Unknown time  . magnesium oxide (MAG-OX) 400 MG tablet Take 400 mg by mouth 2 (two) times daily.   05/22/2015 at Unknown time  . potassium chloride (K-DUR) 10  MEQ tablet Take 2  tablets (20 mEq total) by mouth daily. 60 tablet 6 05/22/2015 at Unknown time  . torsemide (DEMADEX) 20 MG tablet Take 2 tablets (40 mg total) by mouth AC breakfast. (Patient taking differently: Take 20 mg by mouth 2 (two) times daily. ) 90 tablet 3 05/22/2015 at Unknown time  . traMADol (ULTRAM) 50 MG tablet Take 1 tablet (50 mg total) by mouth every 6 (six) hours as needed (pain). 50 tablet 0 05/22/2015 at Unknown time  . docusate sodium (COLACE) 100 MG capsule Take 1 capsule (100 mg total) by mouth daily as needed for mild constipation. (Patient not taking: Reported on 05/22/2015) 30 capsule 1   . torsemide (DEMADEX) 20 MG tablet Take 1 tablet (20 mg total) by mouth daily after supper. (Patient not taking: Reported on 05/22/2015) 30 tablet 0 Taking   Scheduled: . aztreonam  2 g Intravenous 3 times per day  . budesonide  0.25 mg Nebulization BID  . Chlorhexidine Gluconate Cloth  6 each Topical Q0600  . dabigatran  150 mg Oral BID  . diltiazem  60 mg Oral 3 times per day  . furosemide  40 mg Intravenous BID  . levalbuterol  1.25 mg Nebulization Q4H  . levofloxacin (LEVAQUIN) IV  500 mg Intravenous Q48H  . levofloxacin (LEVAQUIN) IV  750 mg Intravenous Once  . magnesium oxide  400 mg Oral BID  . methylPREDNISolone (SOLU-MEDROL) injection  40 mg Intravenous Q6H  . montelukast  10 mg Oral QHS  . mupirocin ointment  1 application Nasal BID  . potassium chloride  20 mEq Oral Daily   Continuous:  CWU:[id number], benzocaine, traMADol  Assesment: She was admitted with acute on chronic diastolic heart failure and then developed acute on chronic hypoxic respiratory failure requiring BiPAP. This appeared to be mostly related to her heart failure but she also has developed what looks like pneumonia that is being appropriately treated. She is improving. Her oxygen requirements have decreased. She is no longer requiring BiPAP. 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

## 2015-05-29 NOTE — Progress Notes (Signed)
[facility] PROGRESS NOTE  Sandra Turner UJW:[id number] DOB: March 10, 1941 DOA: 05/22/2015 PCP: [name], MD  Assessment/Plan: 1. Acute on chronic diastolic CHF exacerbation. On IV Lasix with continued good urine output. -7.7L since admission.  Volume status continues to improve, weight down 13lbs since admission. Per cardiology, due to elevated creatinine and BUN IV lasix was held on 9/23 and resumed at  BID on 9/24.  2. Acute on chronic hypoxic respiratory failure on 2.5L at home, related to CHF exacerbation/ HCAP. Patient's breathing had decompensated requiring BiPAP however now she is clinically improved and back on nasal cannula. She has not required venti-mask in the last 24 hours. Pulmonology following. Will continue to wean down  O2 as tolerated. 3. Lobar Pneumonia. Patient found to have infiltrates in the right upper lobe. Continue broad spectrum abx. She remains afebrile, WBC 12.6 today although this may be related to steroids. Will recheck CXR.  4. AKI superimposed on CKD stage III. Likely related to diuresis with Lasix and possibly Vancomycin. Creatinine improving today and continues to have good UOP. Will continue to hold vancomycin, but will continue to give  Lasix BID and follow renal function. Creatinine 1.53/BUN 60 today.    5. Bilateral lower extremity edema, likely related to CHF. Continues to improve.  6. Hyperglycemia. Glucose mildly elevated. Will continue to monitor. 7. Asthma exacerbation,stable. Will continue nebs and IV steroids. Slowly improving.  8. Chronic atrial fibrillation, rate controlled with Cardizem and anticoagulated with Pradaxa. Cardiology consulted and agrees with current management.   9. Severe aortic stenosis s/p AVR with bioprosthetic valve May 2016, asymptomatic 10. Tongue lesion. Worrisome of malignancy, recommend outpatient f/u with ENT. 11. Hemoptysis/epistasis. Hemoptysis continuing to improve. There were also reports of nose bleeds which  have resolved. Will continue Pradaxa. 12. Hypokalemia, likely related to lasix. Repleted.   13. Anemia, chronic. Hemoglobin appears to be near baseline and remains stable. Continue to follow.  Code Status:DNR DVT prophylaxis: SCDs Family Communication:No family at bedside. Discussed with patient who understands and has no concerns at this time. Disposition Plan: Move to medical bed today.   Consultants:  Cardiology-Jonathan Margy Clarks, MD  Pulmonology- Kari Baars, MD  Procedures:    Antibiotics:  Vancomycin 9/21>>9/23  Aztreonam 9/21>>  Levaquin 9/23>>  HPI/Subjective: Feels improved. Still has a cough but was able to ambulate around hospital bed without having to use venti-mask.   Objective: Filed Vitals:   05/29/15 0633  BP: 136/57  Pulse: 78  Temp:   Resp: 25    Intake/Output Summary (Last 24 hours) at 05/29/15 0708 Last data filed at 05/29/15 0400  Gross per 24 hour  Intake    940 ml  Output   1975 ml  Net  -1035 ml   Filed Weights   05/27/15 0500 05/28/15 0400 05/29/15 0500  Weight: 95.1 kg (209 lb 10.5 oz) 96 kg (211 lb 10.3 oz) 94.6 kg (208 lb 8.9 oz)    Exam:   General: NAD, looks comfortable. Sitting in chair.   Cardiovascular: RRR, S1, S2   Respiratory: Crackles at the bases. No wheezing or rhonchi  Abdomen: soft, non tender, no distention , bowel sounds normal  Musculoskeletal: Trace edema bilaterally  Data Reviewed: Basic Metabolic Panel:  Recent Labs Lab 05/23/15 0708  05/25/15 0639 05/26/15 0519 05/27/15 0453 05/27/15 0936 05/28/15 0448 05/29/15 0531  NA 138  < > 139 143 139  --  139 138  K 4.6  < > 3.9 3.3* 2.9*  --  4.1 3.9  CL 99*  < > 99* 98* 97*  --  98* 100*  CO2 32  < > 32 35* 33*  --  32 30  GLUCOSE 145*  < > 109* 122* 148*  --  138* 180*  BUN 26*  < > 37* 35* 50*  --  59* 60*  CREATININE 1.63*  < > 1.45* 1.39* 1.85*  --  1.55* 1.53*  CALCIUM 8.6*  < > 8.8* 8.3* 8.3*  --  8.7* 8.5*  MG 3.0*  --   --   --   --   2.0  --   --   < > = values in this interval not displayed. Liver Function Tests:  Recent Labs Lab 05/22/15 1905 05/23/15 0708  AST 27 27  ALT 14 17  ALKPHOS 92 93  BILITOT 0.7 0.7  PROT 7.3 7.6  ALBUMIN 3.6 3.6  CBC:  Recent Labs Lab 05/22/15 1905  05/25/15 1301 05/26/15 0519 05/27/15 0936 05/28/15 0448 05/29/15 0531  WBC 6.7  < > 7.5 4.1 13.0* 11.7* 12.6*  NEUTROABS 5.4  --   --   --   --   --   --   HGB 8.7*  < > 7.5* 7.7* 8.4* 8.0* 7.9*  HCT 27.4*  < > 23.5* 24.5* 26.7* 25.8* 24.8*  MCV 90.4  < > 89.7 90.1 89.3 89.3 89.5  PLT 155  < > 161 157 227 230 221  < > = values in this interval not displayed.   Recent Labs  02/07/15 0520 02/12/15 0435 03/12/15 2156  BNP 204.8* 271.9* 178.0*    Studies: No results found.  Scheduled Meds: . aztreonam  2 g Intravenous 3 times per day  . budesonide  0.25 mg Nebulization BID  . Chlorhexidine Gluconate Cloth  6 each Topical Q0600  . dabigatran  150 mg Oral BID  . diltiazem  60 mg Oral 3 times per day  . furosemide  40 mg Intravenous BID  . levalbuterol  1.25 mg Nebulization Q4H  . levofloxacin (LEVAQUIN) IV  500 mg Intravenous Q48H  . levofloxacin (LEVAQUIN) IV  750 mg Intravenous Once  . magnesium oxide  400 mg Oral BID  . methylPREDNISolone (SOLU-MEDROL) injection  40 mg Intravenous Q6H  . montelukast  10 mg Oral QHS  . mupirocin ointment  1 application Nasal BID  . 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 If 7PM-7AM, please contact night-coverage at [email or web address], password [facility] 05/29/2015, 7:08 AM  LOS: 6 days     By signing my name below, I, Burnett Harry, attest that this documentation has been prepared under the direction and in the presence of [facility]. MD Electronically Signed: Burnett Harry, Scribe. 05/29/2015  I, Dr. Erick Blinks, personally performed the services described in this documentaiton. All medical record entries made by the scribe were at my direction and  in my presence. I have reviewed the chart and agree that the record reflects my personal performance and is accurate and complete  Erick Blinks, MD, 05/29/2015 9:10 AM

## 2015-05-29 NOTE — Progress Notes (Signed)
Patient was on 4 liters nasal cannula all night with saturation 93-94%, out of bed to recliner chair, no discomforts or complaints noted.

## 2015-05-30 ENCOUNTER — Ambulatory Visit: Payer: Medicare Other | Admitting: Pulmonary Disease

## 2015-05-30 LAB — CBC
HEMATOCRIT: 25.2 % — AB (ref 36.0–46.0)
HEMOGLOBIN: 8.1 g/dL — AB (ref 12.0–15.0)
MCH: 28.2 pg (ref 26.0–34.0)
MCHC: 32.1 g/dL (ref 30.0–36.0)
MCV: 87.8 fL (ref 78.0–100.0)
Platelets: 227 10*3/uL (ref 150–400)
RBC: 2.87 MIL/uL — AB (ref 3.87–5.11)
RDW: 15.7 % — ABNORMAL HIGH (ref 11.5–15.5)
WBC: 13.7 10*3/uL — AB (ref 4.0–10.5)

## 2015-05-30 LAB — BASIC METABOLIC PANEL
ANION GAP: 9 (ref 5–15)
BUN: 65 mg/dL — ABNORMAL HIGH (ref 6–20)
CALCIUM: 8.5 mg/dL — AB (ref 8.9–10.3)
CO2: 29 mmol/L (ref 22–32)
Chloride: 99 mmol/L — ABNORMAL LOW (ref 101–111)
Creatinine, Ser: 1.55 mg/dL — ABNORMAL HIGH (ref 0.44–1.00)
GFR, EST AFRICAN AMERICAN: 37 mL/min — AB (ref >60)
GFR, EST NON AFRICAN AMERICAN: 32 mL/min — AB (ref >60)
GLUCOSE: 157 mg/dL — AB (ref 65–99)
POTASSIUM: 3.7 mmol/L (ref 3.5–5.1)
Sodium: 137 mmol/L (ref 135–145)

## 2015-05-30 MED ORDER — DILTIAZEM HCL ER COATED BEADS 180 MG PO CP24: 180.0000 mg | ORAL_CAPSULE | Freq: Every day | ORAL | Status: DC

## 2015-05-30 MED ORDER — LEVOFLOXACIN 500 MG PO TABS: 500.0000 mg | ORAL_TABLET | ORAL | Status: DC

## 2015-05-30 MED ORDER — PREDNISONE 10 MG PO TABS: ORAL_TABLET | ORAL | Status: DC

## 2015-05-30 MED ORDER — TORSEMIDE 20 MG PO TABS: 40.0000 mg | ORAL_TABLET | Freq: Every day | ORAL | Status: DC

## 2015-05-30 MED ORDER — TORSEMIDE 20 MG PO TABS: 20.0000 mg | ORAL_TABLET | Freq: Every evening | ORAL | Status: DC

## 2015-05-30 NOTE — Progress Notes (Signed)
She feels well. She did not require mask oxygen at all yesterday. She has moved to the floor. She has CHF exacerbation plus healthcare associated pneumonia. She has oxygen at home and hopes to go home today. She will need chest x-ray to document clearing of her infiltrates. She follows with a pulmonologist in [place] and will continue follow-up there. I will plan to sign off. Thank you for allowing me to see her with you

## 2015-05-30 NOTE — Progress Notes (Signed)
Patient ID: Kao Berkheimer, female   DOB: [birth year], 74 y.o.   MRN: [id number]     Subjective:    SOB improving.   Objective:   Temp:  [98 F (36.7 C)-98.8 F (37.1 C)] 98 F (36.7 C) (09/26 0550) Pulse Rate:  [69-75] 75 (09/26 0550) Resp:  [18-20] 18 (09/26 0550) BP: (98-152)/(41-71) 136/52 mmHg (09/26 0550) SpO2:  [91 %-95 %] 93 % (09/26 0726) Weight:  [211 lb 12.8 oz (96.072 kg)] 211 lb 12.8 oz (96.072 kg) (09/26 0707) Last BM Date: 05/28/15  Filed Weights   05/28/15 0400 05/29/15 0500 05/30/15 0707  Weight: 211 lb 10.3 oz (96 kg) 208 lb 8.9 oz (94.6 kg) 211 lb 12.8 oz (96.072 kg)    Intake/Output Summary (Last 24 hours) at 05/30/15 0925 Last data filed at 05/30/15 0903  Gross per 24 hour  Intake    920 ml  Output   1400 ml  Net   -480 ml    Telemetry: afib rates 70s  Exam:  General:NAD  HEENT: sclera clear  Resp: CTAB  Cardiac: irreg, 2/6 systolic murmur at apex, no JVD  GI: abdomen soft, NT, ND  MSK: no LE edema  Neuro: no focal deficits  Psych: appropriate affect  Lab Results:  Basic Metabolic Panel:  Recent Labs Lab 05/27/15 0936 05/28/15 0448 05/29/15 0531 05/30/15 0602  NA  --  139 138 137  K  --  4.1 3.9 3.7  CL  --  98* 100* 99*  CO2  --  32 30 29  GLUCOSE  --  138* 180* 157*  BUN  --  59* 60* 65*  CREATININE  --  1.55* 1.53* 1.55*  CALCIUM  --  8.7* 8.5* 8.5*  MG 2.0  --   --   --     Liver Function Tests: No results for input(s): AST, ALT, ALKPHOS, BILITOT, PROT, ALBUMIN in the last 168 hours.  CBC:  Recent Labs Lab 05/28/15 0448 05/29/15 0531 05/30/15 0602  WBC 11.7* 12.6* 13.7*  HGB 8.0* 7.9* 8.1*  HCT 25.8* 24.8* 25.2*  MCV 89.3 89.5 87.8  PLT 230 221 227    Cardiac Enzymes: No results for input(s): CKTOTAL, CKMB, CKMBINDEX, TROPONINI in the last 168 hours.  BNP: No results for input(s): PROBNP in the last 8760 hours.  Coagulation: No results for input(s): INR in the last 168  hours.  ECG:   Medications:   Scheduled Medications: . aztreonam  2 g Intravenous 3 times per day  . budesonide  0.25 mg Nebulization BID  . dabigatran  150 mg Oral BID  . diltiazem  60 mg Oral 3 times per day  . furosemide  40 mg Intravenous BID  . levalbuterol  1.25 mg Nebulization 4 times per day  . levofloxacin (LEVAQUIN) IV  500 mg Intravenous Q48H  . magnesium oxide  400 mg Oral BID  . methylPREDNISolone (SOLU-MEDROL) injection  40 mg Intravenous Q6H  . montelukast  10 mg Oral QHS  . mupirocin ointment  1 application Nasal BID  . potassium chloride  20 mEq Oral Daily     Infusions:     PRN Medications:  albuterol, benzocaine, traMADol     Assessment/Plan   1. Acute on chronic LV diastolic/RV systolic HF - 03/2015 echo 55-60%, cannot eval diastolic function due to afib but severe LAE, moderate to severe RV failure - negative  yesterday, negative 8.2 liters since admission. She is on lasix  bid, stable Cr and BUN over last  few days though slightly above her baseline of 1.0-1.4  - will d/c IV lasix, change to torsemide  in AM and  in PM  2. Afib -rate control with diltiazem, dose was increased to  tid yesterday. Rates 70s. Will change to long acting dilt  daily starting tomorrow. - on pradaxa for stroke prevention.  3. Pneumonia - abx per primary team  4. Hypokalemia - replacement per primary team.  8. Anemia - per primary team   [facility] for discharge from cardiac standpoint. She will need to f/u with NP Lyman Bishop in 1 week with labs around that time. We will sign off.      Dina Rich, M.D.

## 2015-05-30 NOTE — Discharge Summary (Signed)
Physician Discharge Summary  Sandra Turner IHK:[id number] DOB: [birth year] DOA: 05/22/2015  PCP: [name], MD  Admit date: 05/22/2015 Discharge date: 05/30/2015  Time spent: 35 minutes  Recommendations for Outpatient Follow-up:  1. Follow up with PCP in 1-2 weeks.   2. Follow up with pulmonologist in 1-2 weeks for repeat CXR for resolution of PNA.  3. Outpatient ENT evaluation for tongue lesion.  4. Follow up with cardiology in 1 week.  5. Will arrange home health RN.  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 female with a hx of atrial fibrillation, diastolic CHF,  asthma, and chronic hypoxic respiratory failure presented with SOB and LE edema. While in the ED, CXR revealed bilateral infiltrates consistent with CHF and PNA. She was admitted for further management.    Hospital Course:  Acute on chronic diastolic CHF exacerbation, improved. Received IV Lasix with continued good urine output and improved volume status.    Cardiology input appreciated. She appears to be approaching euvolemia. IV Lasix was discontinued and she was transitioned to Torsemide. She will follow up with cardiology as an outpatient.    Acute on chronic hypoxic respiratory failure on 2.5L at home, related  to CHF exacerbation / HCAP. Patient's breathing had decompensated requiring BiPAP however with abx and Lasix, she is clinically improved and back on 4L nasal cannula. She has not required venti-mask in the last 48 hours. She feels like she is starting to approach her baseline.  Pulmonology input appreciated and recommended follow up CXR for resolution of PNA.   Lobar Pneumonia. Patient found to have infiltrates in the right upper lobe. Given broad spectrum abx with significant improvement. Afebrile throughout ED stay. WBC was elevated however this may be related to steroids. Repeat CXR revealed mild to moderate edema/ CHF, improved since prior study. She will complete a course of abx.    AKI superimposed on CKD stage III. Likely related to diuresis with Lasix and possibly Vancomycin. Creatinine improved and continued to have good UOP. Creatinine has been stable, will discharge on Torsemide.   Bilateral lower extremity edema, likely related to CHF. Improved.  Hyperglycemia, stable. Continue home medications.   Asthma exacerbation,stable. Patient was continued on bronchodilators and IV steroids. Her SOB and wheezing have resolved. She will be discharged on a Prednisone taper and continue on nebulizer treatments.    Chronic atrial fibrillation, rate controlled with Cardizem and anticoagulated with Pradaxa. Continue home meds.   Severe aortic stenosis s/p AVR with bioprosthetic valve May 2016, asymptomatic  Tongue lesion. Worrisome of malignancy, recommend outpatient f/u with ENT.  Hemoptysis/epistasis, resolved. Continue Pradaxa.  Hypokalemia, likely related to lasix. Repleted.   Anemia, chronic. Hemoglobin appears to be near baseline and remains stable. Follow up with  PCP for further workup.    Procedures:  none  Consultations:  Cardiology-Jonathan Margy Clarks, MD  Pulmonology- Kari Baars, MD  Discharge Exam: Filed Vitals:   05/30/15 0550  BP: 136/52  Pulse: 75  Temp: 98 F  (36.7 C)  Resp: 18     General: NAD, looks comfortable  Cardiovascular: RRR, S1, S2   Respiratory: clear bilaterally, No wheezing, rales or rhonchi  Abdomen: soft, non tender, no distention , bowel sounds normal  Musculoskeletal: No edema b/l   Discharge Instructions    Current Discharge Medication List    CONTINUE these medications which have NOT CHANGED   Details  albuterol (PROAIR HFA) 108 (90 BASE) MCG/ACT inhaler Inhale 2 puffs into the lungs 4 (four) times daily as needed for wheezing or shortness of breath.    albuterol (PROVENTIL) (2.5 MG/3ML) 0.083% nebulizer solution Take 3 mLs (2.5 mg total) by nebulization every 4 (four) hours as needed for wheezing or shortness of breath. Qty: 75 mL, Refills: 12    amiodarone (PACERONE) 200 MG tablet Take 200 mg by mouth daily.    budesonide (PULMICORT) 0.25 MG/2ML nebulizer solution Take 2 mLs (0.25 mg total) by nebulization 2 (two) times daily. Qty: 60 mL, Refills: 11    dabigatran (PRADAXA) 150 MG CAPS capsule Take 1 capsule (150 mg total) by mouth 2 (two) times daily. Qty: 60 capsule, Refills: 6    diltiazem (CARDIZEM) 30 MG tablet Take 1 tablet (30 mg total) by mouth 2 (two) times daily. Qty: 60 tablet, Refills: 2    Fluticasone Propionate, Inhal, (FLOVENT DISKUS) 250 MCG/BLIST AEPB Inhale 1 puff into the lungs 2 (two) times daily. Qty: 60 each, Refills: 3    magnesium oxide (MAG-OX) 400 MG tablet Take 400 mg by mouth 2 (two) times daily.    potassium chloride (K-DUR) 10 MEQ tablet Take 2 tablets (20 mEq total) by mouth daily. Qty: 60 tablet, Refills: 6    !! torsemide (DEMADEX) 20 MG tablet Take 2 tablets (40 mg total) by mouth AC breakfast. Qty: 90 tablet, Refills: 3    traMADol (ULTRAM) 50 MG tablet Take 1 tablet (50 mg total) by mouth every 6 (six) hours as needed (pain). Qty: 50 tablet, Refills: 0    docusate sodium (COLACE) 100 MG capsule Take 1 capsule (100 mg total) by mouth daily as needed for mild  constipation. Qty: 30 capsule, Refills: 1    !! torsemide (DEMADEX) 20 MG tablet Take 1 tablet (20 mg total) by mouth daily after supper. Qty: 30 tablet, Refills: 0     !! - Potential duplicate medications found. Please discuss with provider.     Allergies  Allergen Reactions  . Bee Venom Swelling  . Latex Rash  . Penicillins Rash   Follow-up Information    Follow up with Nona Dell, MD. Call in 1 day.   Specialty:  Cardiology   Contact information:   [address] [place] [address] [phone]        The results of significant diagnostics from this hospitalization (including imaging, microbiology, ancillary and laboratory) are listed below for reference.    Significant Diagnostic Studies: Dg Chest 2 View  05/22/2015   CLINICAL DATA:  Shortness of breath since prior new lung surgery in May of the shear now worsened, chronic diastolic CHF, COPD, atrial fibrillation, post AVR and Maze procedure, pulmonary hypertension, history pneumonia  EXAM: CHEST  2 VIEW  COMPARISON:  04/07/2015  FINDINGS: Enlargement of cardiac silhouette post median sternotomy, AVR,  and LEFT atrial appendage occlusion.  Atherosclerotic calcification aorta.  Bibasilar pleural effusions and atelectasis greater on LEFT.  Diffuse BILATERAL pulmonary infiltrates asymmetrically greater on RIGHT, could represent asymmetric edema or pneumonia, favor edema.  No pneumothorax.  Bones demineralized.  IMPRESSION: Enlargement of cardiac silhouette post AVR with asymmetric infiltrates RIGHT greater than LEFT, bibasilar effusions and atelectasis, favor pulmonary edema over pneumonia.   Electronically Signed   By: Ulyses Southward M.D.   On: 05/22/2015 19:39   US Venous Img Lower Bilateral  05/23/2015   CLINICAL DATA:  74 year old female with bilateral lower extremity edema for the past several months since heart surgery in May  EXAM: BILATERAL LOWER EXTREMITY VENOUS DOPPLER ULTRASOUND  TECHNIQUE: Gray-scale sonography  with graded compression, as well as color Doppler and duplex ultrasound were performed to evaluate the lower extremity deep venous systems from the level of the common femoral vein and including the common femoral, femoral, profunda femoral, popliteal and calf veins including the posterior tibial, peroneal and gastrocnemius veins when visible. The superficial great saphenous vein was also interrogated. Spectral Doppler was utilized to evaluate flow at rest and with distal augmentation maneuvers in the common femoral, femoral and popliteal veins.  COMPARISON:  None.  FINDINGS: RIGHT LOWER EXTREMITY  Common Femoral Vein: No evidence of thrombus. Normal compressibility, respiratory phasicity and response to augmentation. Increased pulsatility of the venous waveform.  Saphenofemoral Junction: No evidence of thrombus. Normal compressibility and flow on color Doppler imaging.  Profunda Femoral Vein: No evidence of thrombus. Normal compressibility and flow on color Doppler imaging.  Femoral Vein: No evidence of thrombus. Normal compressibility, respiratory phasicity and response to augmentation.  Popliteal Vein: No evidence of thrombus. Normal compressibility, respiratory phasicity and response to augmentation.  Calf Veins: No evidence of thrombus. Normal compressibility and flow on color Doppler imaging.  Superficial Great Saphenous Vein: No evidence of thrombus. Normal compressibility and flow on color Doppler imaging.  Venous Reflux:  None.  Other Findings:  Superficial subcutaneous edema  LEFT LOWER EXTREMITY  Common Femoral Vein: No evidence of thrombus. Normal compressibility, respiratory phasicity and response to augmentation. Increased pulsatility of the venous waveform.  Saphenofemoral Junction: No evidence of thrombus. Normal compressibility and flow on color Doppler imaging.  Profunda Femoral Vein: No evidence of thrombus. Normal compressibility and flow on color Doppler imaging.  Femoral Vein: No evidence of  thrombus. Normal compressibility, respiratory phasicity and response to augmentation.  Popliteal Vein: No evidence of thrombus. Normal compressibility, respiratory phasicity and response to augmentation.  Calf Veins: No evidence of thrombus. Normal compressibility and flow on color Doppler imaging.  Superficial Great Saphenous Vein: No evidence of thrombus. Normal compressibility and flow on color Doppler imaging.  Venous Reflux:  None.  Other Findings:  Superficial subcutaneous edema.  IMPRESSION: 1. No evidence of deep venous thrombosis in either lower extremity. 2. Increased pulsatility of the venous waveforms bilaterally. This can be seen in the setting of elevated right heart pressures. Differential considerations include tricuspid regurgitation, right heart dysfunction, pulmonary arterial hypertension and COPD. 3. Superficial subcutaneous edema in the lower extremities from the knees to the ankles.   Electronically Signed   By: Malachy Moan M.D.   On: 05/23/2015 10:32   Dg Chest Port 1 View  05/29/2015   CLINICAL DATA:  CHF  EXAM: PORTABLE CHEST 1 VIEW  COMPARISON:  05/26/2015  FINDINGS: Cardiomegaly. Prior valve replacement. Bilateral airspace opacities have improved since prior study, likely improving edema with mild to moderate edema persisting. Small bilateral  effusions.  IMPRESSION: Mild to moderate edema/ CHF, improved since prior study.   Electronically Signed   By: Charlett Nose M.D.   On: 05/29/2015 10:23   Dg Chest Port 1 View  05/26/2015   CLINICAL DATA:  Respiratory failure.  EXAM: PORTABLE CHEST - 1 VIEW  COMPARISON:  05/25/2015.  FINDINGS: Prior cardiac valve replacement. Left atrial appendage clip noted. Stable cardiomegaly. Diffuse multifocal bilateral pulmonary infiltrates particularly prominent in the right upper lobe again noted. Small bilateral pleural effusions. No pneumothorax.  IMPRESSION: 1. Diffuse multifocal bilateral pulmonary infiltrates particularly prominent in the right  upper lobe again noted. No significant interim change. 2. Prior cardiac valve replacement.  Stable cardiomegaly.   Electronically Signed   By: Maisie Fus  Register   On: 05/26/2015 08:02   Dg Chest Port 1 View  05/25/2015   CLINICAL DATA:  Respiratory distress  EXAM: 05/22/2015  COMPARISON:  None.  FINDINGS: Severe bilateral multifocal infiltrates diffusely worse. Particularly increased severity lateral right lower lobe and diffusely throughout the right upper lobe. Stable small left pleural effusions. Stable cardiac enlargement, mild.  IMPRESSION: Extensive bilateral infiltrates diffusely worse particularly right upper lobe.   Electronically Signed   By: Esperanza Heir M.D.   On: 05/25/2015 09:23    Microbiology: Recent Results (from the past 240 hour(s))  MRSA PCR Screening     Status: Abnormal   Collection Time: 05/25/15 11:15 AM  Result Value Ref Range Status   MRSA by PCR POSITIVE (A) NEGATIVE Final    Comment:        The GeneXpert MRSA Assay (FDA approved for NASAL specimens only), is one component of a comprehensive MRSA colonization surveillance program. It is not intended to diagnose MRSA infection nor to guide or monitor treatment for MRSA infections. RESULT CALLED TO, READ BACK BY AND VERIFIED WITH: SPANGLER,E. AT 1618 ON 05/25/2015 BY BAUGHAM,M.      Labs: Basic Metabolic Panel:  Recent Labs Lab 05/26/15 0519 05/27/15 0453 05/27/15 0936 05/28/15 0448 05/29/15 0531 05/30/15 0602  NA 143 139  --  139 138 137  K 3.3* 2.9*  --  4.1 3.9 3.7  CL 98* 97*  --  98* 100* 99*  CO2 35* 33*  --  32 30 29  GLUCOSE 122* 148*  --  138* 180* 157*  BUN 35* 50*  --  59* 60* 65*  CREATININE 1.39* 1.85*  --  1.55* 1.53* 1.55*  CALCIUM 8.3* 8.3*  --  8.7* 8.5* 8.5*  MG  --   --  2.0  --   --   --     CBC:  Recent Labs Lab 05/26/15 0519 05/27/15 0936 05/28/15 0448 05/29/15 0531 05/30/15 0602  WBC 4.1 13.0* 11.7* 12.6* 13.7*  HGB 7.7* 8.4* 8.0* 7.9* 8.1*  HCT 24.5* 26.7*  25.8* 24.8* 25.2*  MCV 90.1 89.3 89.3 89.5 87.8  PLT 157 227 230 221 227    BNP: BNP (last 3 results)  Recent Labs  02/07/15 0520 02/12/15 0435 03/12/15 2156  BNP 204.8* 271.9* 178.0*       Signed:  Jehanzeb Memon. MD [facility] 05/30/2015, 11:48 AM  By signing my name below, I, Burnett Harry, attest that this documentation has been prepared under the direction and in the presence of [facility]. MD Electronically Signed: Burnett Harry, Scribe. 05/30/2015 11:47am  I, Dr. Erick Blinks, personally performed the services described in this documentaiton. All medical record entries made by the scribe were at my direction and in my presence.  I have reviewed the chart and agree that the record reflects my personal performance and is accurate and complete  Erick Blinks, MD, 05/30/2015 4:50 PM

## 2015-05-30 NOTE — Care Management Important Message (Signed)
Important Message  Patient Details  Name: Sandra Turner MRN: [id number] Date of Birth: June 06, 1941   Medicare Important Message Given:  Yes-third notification given    Cheryl Flash, RN 05/30/2015, 2:33 PM

## 2015-05-30 NOTE — Progress Notes (Signed)
Foley removed this afternoon per order, patient denied voiding stating "I don't have any trouble voiding after having a foley".  Informed patient importance of being able to void and to call PCP if unable to void.  Central Telemetry called, telemetry removed.  Discharge instructions reviewed with patient, teaching sheets on CHF given and reviewed with patient.  Left via wheelchair with home portable tank in place at 3L per patients request.  Left to be discharged home with friend. Stable at discharge.

## 2015-05-30 NOTE — Care Management Note (Signed)
Case Management Note  Patient Details  Name: Sandra Turner MRN: [id number] Date of Birth: 1941-06-17  Subjective/Objective:                    Action/Plan:   Expected Discharge Date:                  Expected Discharge Plan:  Home w Home Health Services  In-House Referral:  NA  Discharge planning Services  CM Consult  Post Acute Care Choice:  Home Health Choice offered to:  Patient  DME Arranged:    DME Agency:     HH Arranged:  RN, PT HH Agency:  [facility]  Status of Service:  Completed, signed off  Medicare Important Message Given:  Yes-third notification given Date Medicare IM Given:    Medicare IM give by:    Date Additional Medicare IM Given:    Additional Medicare Important Message give by:     If discussed at Long Length of Stay Meetings, dates discussed:    Additional Comments: Pt to be discharged home today with [facility] RN and PT (per pts choice). Alroy Bailiff of [facility] is aware and will collect pts information from the chart. HH services to start within 48 hours of discharge. No new DME needs noted as pt has home O2 and neb machine. Pt would like EMS to transport pt home. Pt and pts nurse aware of discharge arrangements. Arlyss Queen [place], RN 05/30/2015, 2:35 PM

## 2015-05-31 ENCOUNTER — Telehealth: Payer: Self-pay | Admitting: Adult Health

## 2015-05-31 NOTE — Telephone Encounter (Signed)
States she was told to hold this am dose bu start pradaxa back tonight,has f/u next week

## 2015-05-31 NOTE — Telephone Encounter (Signed)
Patient wants to know when she is start back taking Pradaxa. / tg

## 2015-06-06 ENCOUNTER — Ambulatory Visit (INDEPENDENT_AMBULATORY_CARE_PROVIDER_SITE_OTHER): Payer: Medicare Other | Admitting: Adult Health

## 2015-06-06 ENCOUNTER — Encounter: Payer: Self-pay | Admitting: Adult Health

## 2015-06-06 VITALS — BP 112/58 | HR 72 | Ht 62.0 in

## 2015-06-06 DIAGNOSIS — I482 Chronic atrial fibrillation: Secondary | ICD-10-CM | POA: Diagnosis not present

## 2015-06-06 DIAGNOSIS — I5032 Chronic diastolic (congestive) heart failure: Secondary | ICD-10-CM

## 2015-06-06 DIAGNOSIS — I4821 Permanent atrial fibrillation: Secondary | ICD-10-CM

## 2015-06-06 NOTE — Progress Notes (Deleted)
Name: Sandra Turner    DOB: 05/08/1941  Age: 74 y.o.  MR#: [id number]       PCP:  [name], MD      Insurance: Payor: [employer] MEDICARE / Plan: [facility] MEDICARE / Product Type: *No Product type* /   CC:   No chief complaint on file.   VS Filed Vitals:   06/06/15 1302  BP: 112/58  Pulse: 72  Height:  (1.575 m)  SpO2: 84%    Weights Current Weight  05/30/15 211 lb 12.8 oz (96.072 kg)  04/28/15 192 lb (87.091 kg)  04/07/15 218 lb (98.884 kg)    Blood Pressure  BP Readings from Last 3 Encounters:  06/06/15 112/58  05/30/15 154/60  04/28/15 134/72     Admit date:  (Not on file) Last encounter with RMR:  05/31/2015   Allergy Bee venom; Latex; and Penicillins  Current Outpatient Prescriptions  Medication Sig Dispense Refill  . albuterol (PROAIR HFA) 108 (90 BASE) MCG/ACT inhaler Inhale 2 puffs into the lungs 4 (four) times daily as needed for wheezing or shortness of breath.    Marland Kitchen albuterol (PROVENTIL) (2.5 MG/3ML) 0.083% nebulizer solution Take 3 mLs (2.5 mg total) by nebulization every 4 (four) hours as needed for wheezing or shortness of breath. 75 mL 12  . amiodarone (PACERONE) 200 MG tablet Take 200 mg by mouth daily.    . budesonide (PULMICORT) 0.25 MG/2ML nebulizer solution Take 2 mLs (0.25 mg total) by nebulization 2 (two) times daily. 60 mL 11  . dabigatran (PRADAXA) 150 MG CAPS capsule Take 1 capsule (150 mg total) by mouth 2 (two) times daily. 60 capsule 6  . diltiazem (CARDIZEM CD) 180 MG 24 hr capsule Take 1 capsule (180 mg total) by mouth daily. 30 capsule 1  . docusate sodium (COLACE) 100 MG capsule Take 1 capsule (100 mg total) by mouth daily as needed for mild constipation. 30 capsule 1  . Fluticasone Propionate, Inhal, (FLOVENT DISKUS) 250 MCG/BLIST AEPB Inhale 1 puff into the lungs 2 (two) times daily. 60 each 3  . levofloxacin (LEVAQUIN) 500 MG tablet Take 1 tablet (500 mg total) by mouth every other day. 1 tablet 0  . magnesium oxide (MAG-OX) 400 MG  tablet Take 400 mg by mouth 2 (two) times daily.    . potassium chloride (K-DUR) 10 MEQ tablet Take 2 tablets (20 mEq total) by mouth daily. 60 tablet 6  . predniSONE (DELTASONE) 10 MG tablet Take  po daily for 2 days then  daily for 2 days then  daily for 2 days then  daily for 2 days then stop 20 tablet 0  . torsemide (DEMADEX) 20 MG tablet Take 2 tablets (40 mg total) by mouth AC breakfast. (Patient taking differently: Take 20 mg by mouth 2 (two) times daily. ) 90 tablet 3  . torsemide (DEMADEX) 20 MG tablet Take 1 tablet (20 mg total) by mouth daily after supper. 30 tablet 0  . traMADol (ULTRAM) 50 MG tablet Take 1 tablet (50 mg total) by mouth every 6 (six) hours as needed (pain). 50 tablet 0   No current facility-administered medications for this visit.    Discontinued Meds:   There are no discontinued medications.  Patient Active Problem List   Diagnosis Date Noted  . Hypokalemia 05/26/2015  . Chronic anemia 05/26/2015  . Lobar pneumonia (HCC) 05/26/2015  . Acute on chronic respiratory failure with hypoxemia (HCC)   . Respiratory distress   . Right-sided heart failure (HCC) 05/24/2015  .  Edema   . Tongue lesion 05/23/2015  . Chronic respiratory failure with hypoxia (HCC) 05/23/2015  . AKI (acute kidney injury) (HCC) 05/23/2015  . CKD (chronic kidney disease), stage III 05/23/2015  . Congestive heart disease (HCC)   . Acute on chronic diastolic heart failure (HCC) 03/14/2015  . Anemia 03/12/2015  . ARF (acute renal failure) (HCC) 03/12/2015  . Dyspnea 03/12/2015  . 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 Pressure ulcer 02/01/2015  . Asthmatic bronchitis with acute exacerbation 01/31/2015  . Mucus plugging of bronchi 01/31/2015  . Atelectasis   . Shortness of breath   . Pleural effusion   . 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 S/P Maze operation for atrial fibrillation 01/19/2015  . Severe aortic stenosis  11/22/2014  . Acute respiratory failure with hypoxia (HCC) 09/11/2014  . Chronic bronchitis with acute exacerbation (HCC) 09/11/2014  . Family history of malignant hyperthermia 04/26/2014  . Critical aortic valve stenosis 08/25/2012  . Pulmonary hypertension (HCC) 08/25/2012  . Atrial fibrillation (HCC) 08/22/2012  . Obesity 08/22/2012    LABS    Component Value Date/Time   NA 137 05/30/2015 0602   NA 138 05/29/2015 0531   NA 139 05/28/2015 0448   K 3.7 05/30/2015 0602   K 3.9 05/29/2015 0531   K 4.1 05/28/2015 0448   CL 99* 05/30/2015 0602   CL 100* 05/29/2015 0531   CL 98* 05/28/2015 0448   CO2 29 05/30/2015 0602   CO2 30 05/29/2015 0531   CO2 32 05/28/2015 0448   GLUCOSE 157* 05/30/2015 0602   GLUCOSE 180* 05/29/2015 0531   GLUCOSE 138* 05/28/2015 0448   BUN 65* 05/30/2015 0602   BUN 60* 05/29/2015 0531   BUN 59* 05/28/2015 0448   CREATININE 1.55* 05/30/2015 0602   CREATININE 1.53* 05/29/2015 0531   CREATININE 1.55* 05/28/2015 0448   CREATININE 1.58* 05/06/2015 1730   CREATININE 1.84* 04/29/2015 1444   CALCIUM 8.5* 05/30/2015 0602   CALCIUM 8.5* 05/29/2015 0531   CALCIUM 8.7* 05/28/2015 0448   GFRNONAA 32* 05/30/2015 0602   GFRNONAA 33* 05/29/2015 0531   GFRNONAA 32* 05/28/2015 0448   GFRAA 37* 05/30/2015 0602   GFRAA 38* 05/29/2015 0531   GFRAA 37* 05/28/2015 0448   CMP     Component Value Date/Time   NA 137 05/30/2015 0602   K 3.7 05/30/2015 0602   CL 99* 05/30/2015 0602   CO2 29 05/30/2015 0602   GLUCOSE 157* 05/30/2015 0602   BUN 65* 05/30/2015 0602   CREATININE 1.55* 05/30/2015 0602   CREATININE 1.58* 05/06/2015 1730   CALCIUM 8.5* 05/30/2015 0602   PROT 7.6 05/23/2015 0708   ALBUMIN 3.6 05/23/2015 0708   AST 27 05/23/2015 0708   ALT 17 05/23/2015 0708   ALKPHOS 93 05/23/2015 0708   BILITOT 0.7 05/23/2015 0708   GFRNONAA 32* 05/30/2015 0602   GFRAA 37* 05/30/2015 0602       Component Value Date/Time   WBC 13.7* 05/30/2015 0602   WBC 12.6*  05/29/2015 0531   WBC 11.7* 05/28/2015 0448   HGB 8.1* 05/30/2015 0602   HGB 7.9* 05/29/2015 0531   HGB 8.0* 05/28/2015 0448   HCT 25.2* 05/30/2015 0602   HCT 24.8* 05/29/2015 0531   HCT 25.8* 05/28/2015 0448   MCV 87.8 05/30/2015 0602   MCV 89.5 05/29/2015 0531   MCV 89.3 05/28/2015 0448    Lipid Panel  No results found for: CHOL, TRIG, HDL, CHOLHDL, VLDL, LDLCALC, LDLDIRECT  ABG  Component Value Date/Time   PHART 7.495* 05/26/2015 0450   PCO2ART 46.2* 05/26/2015 0450   PO2ART 66.1* 05/26/2015 0450   HCO3 34.8* 05/26/2015 0450   TCO2 10.0 05/26/2015 0450   ACIDBASEDEF TEST WILL BE CREDITED 01/20/2015 0930   O2SAT 94.7 05/25/2015 1255     Lab Results  Component Value Date   TSH 4.069 03/13/2015   BNP (last 3 results)  Recent Labs  02/07/15 0520 02/12/15 0435 03/12/15 2156  BNP 204.8* 271.9* 178.0*    ProBNP (last 3 results) No results for input(s): PROBNP in the last 8760 hours.  Cardiac Panel (last 3 results) No results for input(s): CKTOTAL, CKMB, TROPONINI, RELINDX in the last 72 hours.  Iron/TIBC/Ferritin/ %Sat    Component Value Date/Time   IRON 34 03/13/2015 0615   TIBC 279 03/13/2015 0615   FERRITIN 195 03/13/2015 0615   IRONPCTSAT 12 03/13/2015 0615     EKG Orders placed or performed during the hospital encounter of 05/22/15  . EKG 12-Lead  . EKG 12-Lead  . EKG     Prior Assessment and Plan Problem List as of 06/06/2015      Cardiovascular and Mediastinum   Atrial fibrillation [facility])   Last Assessment & Plan 08/16/2014 Office Visit Written 08/16/2014  1:03 PM by [name], MD    She continues on Pradaxa without reported bleeding problems.      Critical aortic valve stenosis   Last Assessment & Plan 08/16/2014 Office Visit Written 08/16/2014  1:02 PM by [name], MD    Severe calcific aortic stenosis. Patient has associated lung disease with moderate ventilatory defect and airway obstruction, severe pulmonary  hypertension, atrial fibrillation, and also reported family history of malignant hyperthermia resulting in 2 deaths with surgery. She is symptomatically stable at this point. Follow-up echocardiogram will be obtained, and she should keep her follow-up with Dr. [name] in the multidisciplinary valve clinic.       Pulmonary hypertension (HCC)   Severe aortic stenosis   Acute on chronic diastolic heart failure (HCC)   Congestive heart disease (HCC)   Right-sided heart failure (HCC)     Respiratory   Acute respiratory failure with hypoxia [facility])   Last Assessment & Plan 04/07/2015 Office Visit Written 04/07/2015  2:47 PM by Lupita Leash, MD    She is still hypoxemic to some degree. I believe this is multifactorial from atelectasis as well as some pulmonary edema.  Plan: Continue management per cardiology Continue using 2 L of oxygen continuously for now      Chronic bronchitis with acute exacerbation (HCC)   Atelectasis   Pleural effusion   Asthmatic bronchitis with acute exacerbation   Last Assessment & Plan 04/07/2015 Office Visit Edited 04/07/2015  2:49 PM by Lupita Leash, MD    During her hospitalization after her surgery she had severe respiratory failure requiring multiple intubations and mechanical ventilation. She produces thick copious mucus requiring frequent bronchoscopies for airway clearance. A CT scan at that time showed findings which were worrisome for possible mold exposure. The BAL culture did not grow a fungal organism.  Her wheezing has improved but she continues to have some mucus production. I believe that she still has asthmatic bronchitis which has not been treated because her prednisone was stopped during her recent hospitalization and she's not been taking an inhaled corticosteroid.  Plan: Check a full pulmonary function test Restart budesonide (she has not been taking this) Use albuterol as needed Consider Repeat CT chest  Mucus plugging of bronchi    Tracheomalacia   Chronic respiratory failure with hypoxia (HCC)   Acute on chronic respiratory failure with hypoxemia (HCC)   Lobar pneumonia (HCC)     Musculoskeletal and Integument   Pressure ulcer     Genitourinary   ARF (acute renal failure) (HCC)   AKI (acute kidney injury) (HCC)   CKD (chronic kidney disease), stage III     Other   Obesity   Family history of malignant hyperthermia   Last Assessment & Plan 04/26/2014 Office Visit Written 04/26/2014  2:42 PM by [name], MD    I continue to recommend that she get screened and have provided her the information to make contact with a center that performs this testing. Frankly, I am not certain whether our anesthesia group can help with this or not. Clearly, this will need to be investigated before she is considered for any type of open aortic valve repair.      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  Atherosclerotic calcification aorta.  Bibasilar pleural effusions and atelectasis greater on LEFT.  Diffuse BILATERAL pulmonary infiltrates asymmetrically greater on RIGHT, could represent asymmetric edema or pneumonia, favor edema.  No pneumothorax.  Bones demineralized.  IMPRESSION: Enlargement of cardiac silhouette post AVR with asymmetric infiltrates RIGHT greater than LEFT, bibasilar effusions and  atelectasis, favor pulmonary edema over pneumonia.   Electronically Signed   By: Ulyses Southward M.D.   On: 05/22/2015 19:39   US Venous Img Lower Bilateral  05/23/2015   CLINICAL DATA:  74 year old female with bilateral lower extremity edema for the past several months since heart surgery in May  EXAM: BILATERAL LOWER EXTREMITY VENOUS DOPPLER ULTRASOUND  TECHNIQUE: Gray-scale sonography with graded compression, as well as color Doppler and duplex ultrasound were performed to evaluate the lower extremity deep venous systems from the level of the common femoral vein and including the common femoral, femoral, profunda femoral, popliteal and calf veins including the posterior tibial, peroneal and gastrocnemius veins when visible. The superficial great saphenous vein was also interrogated. Spectral Doppler was utilized to evaluate flow at rest and with distal augmentation maneuvers in the common femoral, femoral and popliteal veins.  COMPARISON:  None.  FINDINGS: RIGHT LOWER EXTREMITY  Common Femoral Vein: No evidence of thrombus. Normal compressibility, respiratory phasicity and response to augmentation. Increased pulsatility of the venous waveform.  Saphenofemoral Junction: No evidence of thrombus. Normal compressibility and flow on color Doppler imaging.  Profunda Femoral Vein: No evidence of thrombus. Normal compressibility and flow on color Doppler imaging.  Femoral Vein: No evidence of thrombus. Normal compressibility, respiratory phasicity and response to augmentation.  Popliteal Vein: No evidence of thrombus. Normal compressibility, respiratory phasicity and response to augmentation.  Calf Veins: No evidence of thrombus. Normal compressibility and flow on color Doppler imaging.  Superficial  Great Saphenous Vein: No evidence of thrombus. Normal compressibility and flow on color Doppler imaging.  Venous Reflux:  None.  Other Findings:  Superficial subcutaneous edema  LEFT LOWER EXTREMITY  Common Femoral Vein: No  evidence of thrombus. Normal compressibility, respiratory phasicity and response to augmentation. Increased pulsatility of the venous waveform.  Saphenofemoral Junction: No evidence of thrombus. Normal compressibility and flow on color Doppler imaging.  Profunda Femoral Vein: No evidence of thrombus. Normal compressibility and flow on color Doppler imaging.  Femoral Vein: No evidence of thrombus. Normal compressibility, respiratory phasicity and response to augmentation.  Popliteal Vein: No evidence of thrombus. Normal compressibility, respiratory phasicity and response to augmentation.  Calf Veins: No evidence of thrombus. Normal compressibility and flow on color Doppler imaging.  Superficial Great Saphenous Vein: No evidence of thrombus. Normal compressibility and flow on color Doppler imaging.  Venous Reflux:  None.  Other Findings:  Superficial subcutaneous edema.  IMPRESSION: 1. No evidence of deep venous thrombosis in either lower extremity. 2. Increased pulsatility of the venous waveforms bilaterally. This can be seen in the setting of elevated right heart pressures. Differential considerations include tricuspid regurgitation, right heart dysfunction, pulmonary arterial hypertension and COPD. 3. Superficial subcutaneous edema in the lower extremities from the knees to the ankles.   Electronically Signed   By: Malachy Moan M.D.   On: 05/23/2015 10:32   Dg Chest Port 1 View  05/29/2015   CLINICAL DATA:  CHF  EXAM: PORTABLE CHEST 1 VIEW  COMPARISON:  05/26/2015  FINDINGS: Cardiomegaly. Prior valve replacement. Bilateral airspace opacities have improved since prior study, likely improving edema with mild to moderate edema persisting. Small bilateral effusions.  IMPRESSION: Mild to moderate edema/ CHF, improved since prior study.   Electronically Signed   By: Charlett Nose M.D.   On: 05/29/2015 10:23   Dg Chest Port 1 View  05/26/2015   CLINICAL DATA:  Respiratory failure.  EXAM: PORTABLE CHEST - 1 VIEW   COMPARISON:  05/25/2015.  FINDINGS: Prior cardiac valve replacement. Left atrial appendage clip noted. Stable cardiomegaly. Diffuse multifocal bilateral pulmonary infiltrates particularly prominent in the right upper lobe again noted. Small bilateral pleural effusions. No pneumothorax.  IMPRESSION: 1. Diffuse multifocal bilateral pulmonary infiltrates particularly prominent in the right upper lobe again noted. No significant interim change. 2. Prior cardiac valve replacement.  Stable cardiomegaly.   Electronically Signed   By: Maisie Fus  Register   On: 05/26/2015 08:02   Dg Chest Port 1 View  05/25/2015   CLINICAL DATA:  Respiratory distress  EXAM: 05/22/2015  COMPARISON:  None.  FINDINGS: Severe bilateral multifocal infiltrates diffusely worse. Particularly increased severity lateral right lower lobe and diffusely throughout the right upper lobe. Stable small left pleural effusions. Stable cardiac enlargement, mild.  IMPRESSION: Extensive bilateral infiltrates diffusely worse particularly right upper lobe.   Electronically Signed   By: Esperanza Heir M.D.   On: 05/25/2015 09:23

## 2015-06-06 NOTE — Patient Instructions (Signed)
Your physician recommends that you schedule a follow-up appointment in: 3 Months with Joni Reining, NP.  Your physician recommends that you continue on your current medications as directed. Please refer to the Current Medication list given to you today.  Thank you for choosing [facility]!

## 2015-06-06 NOTE — Progress Notes (Signed)
Cardiology Office Note   Date:  06/06/2015   ID:  Sandra Turner, DOB [birth year], MRN [id number]  PCP:  [name], MD  Cardiologist:  McDowell/ Joni Reining, NP   Chief Complaint  Patient presents with  . Atrial Fibrillation  . Congestive Heart Failure      History of Present Illness: Sandra Turner is a 74 y.o. 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  She is here today without any cardiac complaints.  She is finishing up her prednisone and her antibiotics and is feeling and breathing much better.  She denies any chest pain, rapid heart rate, or fluid retention.  She is watching her salt and taking her fluid medication with good results.  Her weight is unchanged since being discharged from the hospital.  Past Medical History  Diagnosis Date  . Arthritis   . Chronic atrial fibrillation (HCC)     a. previously on Pradaxa - on Coumadin since valve surgery.  . Aortic stenosis     a. Severe - s/p pericardial AVR with MAZE 02/04/15.  Marland Kitchen Pneumonia 09/2014  . History of bronchitis 2015  . Joint pain   . Back pain     reason unknown  . Family history of adverse reaction to anesthesia     uncle with MH in the 60's,cousin in the 29's with MH  . 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 COPD (chronic obstructive pulmonary disease) (HCC)   . Normal coronary arteries     a. By cath 11/2014.  . Pulmonary hypertension (HCC)     a. Mod by cath 11/2014.  Marland Kitchen Chronic diastolic CHF (congestive heart failure) (HCC)   . Vocal cord paralysis     a. Dx post-AVR in 01/2015. Required PANDA, intubation during that admission.  . Pleural effusion   . AKI (acute kidney injury) (HCC)     a. H/o AKI after AVR in 01/2015.  . Tracheomalacia   . Anemia     a. 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    Past Surgical History  Procedure Laterality Date  . None    . Left and right heart catheterization with coronary angiogram N/A 11/01/2014    Procedure: LEFT AND RIGHT HEART CATHETERIZATION WITH CORONARY ANGIOGRAM;  Surgeon: [name], MD;  Location: [facility] CATH LAB;  Service: Cardiovascular;  Laterality: N/A;  . Aortic valve replacement N/A 01/19/2015    Procedure: AORTIC VALVE REPLACEMENT (AVR);  Surgeon: Purcell Nails, MD;  Location: [facility] OR;  Service: Open Heart Surgery;  Laterality: N/A;  . Maze N/A 01/19/2015    Procedure: MAZE;  Surgeon: Purcell Nails, MD;  Location: [facility] OR;  Service: Open Heart Surgery;  Laterality: N/A;  . Aortic root enlargement N/A 01/19/2015    Procedure:  AORTIC ROOT ENLARGEMENT;  Surgeon: Purcell Nails, MD;  Location: [facility] OR;  Service: Open Heart  Surgery;  Laterality: N/A;  . Tee without cardioversion N/A 01/19/2015    Procedure: TRANSESOPHAGEAL ECHOCARDIOGRAM (TEE);  Surgeon: Purcell Nails, MD;  Location: [facility] OR;  Service: Open Heart Surgery;  Laterality: N/A;     Current Outpatient Prescriptions  Medication Sig Dispense Refill  . albuterol (PROAIR HFA) 108 (90 BASE) MCG/ACT inhaler Inhale 2 puffs into the lungs 4 (four) times daily as needed for wheezing or shortness of breath.    Marland Kitchen albuterol (PROVENTIL) (2.5 MG/3ML) 0.083% nebulizer solution Take 3 mLs (2.5 mg total) by nebulization every 4 (four) hours as needed for wheezing or  shortness of breath. 75 mL 12  . amiodarone (PACERONE) 200 MG tablet Take 200 mg by mouth daily.    . budesonide (PULMICORT) 0.25 MG/2ML nebulizer solution Take 2 mLs (0.25 mg total) by nebulization 2 (two) times daily. 60 mL 11  . dabigatran (PRADAXA) 150 MG CAPS capsule Take 1 capsule (150 mg total) by mouth 2 (two) times daily. 60 capsule 6  . diltiazem (CARDIZEM CD) 180 MG 24 hr capsule Take 1 capsule (180 mg total) by mouth daily. 30 capsule 1  . docusate sodium (COLACE) 100 MG capsule Take 1 capsule (100 mg total) by mouth daily as needed for mild constipation. 30 capsule 1  . Fluticasone Propionate, Inhal, (FLOVENT DISKUS) 250 MCG/BLIST AEPB Inhale 1 puff into the lungs 2 (two) times daily. 60 each 3  . levofloxacin (LEVAQUIN) 500 MG tablet Take 1 tablet (500 mg total) by mouth every other day. 1 tablet 0  . magnesium oxide (MAG-OX) 400 MG tablet Take 400 mg by mouth 2 (two) times daily.    . potassium chloride (K-DUR) 10 MEQ tablet Take 2 tablets (20 mEq total) by mouth daily. 60 tablet 6  . predniSONE (DELTASONE) 10 MG tablet Take  po daily for 2 days then  daily for 2 days then  daily for 2 days then  daily for 2 days then stop 20 tablet 0  . torsemide (DEMADEX) 20 MG tablet Take 2 tablets (40 mg total) by mouth AC breakfast. (Patient taking differently: Take 20 mg by mouth 2 (two) times daily. ) 90 tablet 3  . torsemide (DEMADEX) 20 MG tablet Take 1 tablet (20 mg total) by mouth daily after supper. 30 tablet 0  . traMADol (ULTRAM) 50 MG tablet Take 1 tablet (50 mg total) by mouth every 6 (six) hours as needed (pain). 50 tablet 0   No current facility-administered medications for this visit.    Allergies:   Bee venom; Latex; and Penicillins    Social History:  The patient  reports that she has never smoked. She has never used smokeless tobacco. She reports that she does not drink alcohol or use illicit drugs.   Family History:  The patient's family history includes  Breast cancer in her mother; Cancer in her father; Congestive Heart Failure in her mother; Malignant hyperthermia in her cousin.    ROS: All other systems are reviewed and negative. Unless otherwise mentioned in H&P    PHYSICAL EXAM: VS:  BP 112/58 mmHg  Pulse 72  Ht  (1.575 m)  Wt   SpO2 84% , BMI There is no weight on file to calculate BMI. GEN: Well nourished, well developed, in no acute distress HEENT: normal Neck: no JVD, carotid bruits, or masses Cardiac: IRRR; no murmurs, rubs, or gallops,no edema  Respiratory:  clear to auscultation bilaterally, normal work of breathing GI: soft, nontender, nondistended, + BS MS: no deformity  or atrophy Skin: warm and dry, no rash Neuro:  Strength and sensation are intact Psych: euthymic mood, full affect   Recent Labs: 03/12/2015: B Natriuretic Peptide 178.0* 03/13/2015: TSH 4.069 05/23/2015: ALT 17 05/27/2015: Magnesium 2.0 05/30/2015: BUN 65*; Creatinine, Ser 1.55*; Hemoglobin 8.1*; Platelets 227; Potassium 3.7; Sodium 137    Lipid Panel No results found for: CHOL, TRIG, HDL, CHOLHDL, VLDL, LDLCALC, LDLDIRECT    Wt Readings from Last 3 Encounters:  05/30/15 211 lb 12.8 oz (96.072 kg)  04/28/15 192 lb (87.091 kg)  04/07/15 218 lb (98.884 kg)     ASSESSMENT AND PLAN:  1. Atrial fibrillation: Doing well with good heart rate control.  No evidence of bleeding, but she does have some bruising noted.  She is without complaints of chest pain.  We will continue her on her current medication regimen, no changes or followup labs currently.   We will see her in 3 months.  2. Diastolic CHF:she has some dependent edema, is wearing some support hose.  She is taking diuretic as directed and doing her best to avoid salt.  I reminded her that her salt limitations 2 g.  Also reminded her that in the cc been getting cooler to avoid heavily salted soups, and canned meals, such as beef stew or chili.  To make her food fresh.  She will continue  daily weights.  She verbalizes understanding.  Current medicines are reviewed at length with the patient today.    Labs/ tests ordered today include:None No orders of the defined types were placed in this encounter.     Disposition:   FU with  3 months  Signed, Joni Reining, NP  06/06/2015 1:21 PM    [facility] [address] [place], [place] [address] Phone: [phone]; Fax: [phone]

## 2015-06-20 ENCOUNTER — Ambulatory Visit (INDEPENDENT_AMBULATORY_CARE_PROVIDER_SITE_OTHER): Payer: Medicare Other | Admitting: *Deleted

## 2015-06-20 DIAGNOSIS — I482 Chronic atrial fibrillation: Secondary | ICD-10-CM | POA: Diagnosis not present

## 2015-06-20 NOTE — Progress Notes (Signed)
Pt was transitioned from warfarin to Pradaxa 150mg  bid for atrial fib and bioprosthetic AVR on 05/20/15.    Pt denies any adverse effects since starting Pradaxa.  Denies extreme bruising, bleeding or GI upset.  Has had a small amt of nose bleed but she wears O2.  Does not have a water bottle on her O2 tank.  [facility].  Also encouraged the use of saline nasal spray several x daily and use of a humidifier in her room.  She verbalized understanding.  Reviewed patients medication list.  Pt is not currently on any combined P-gp and strong CYP3A4 inhibitors/inducers (ketoconazole, traconazole, ritonavir, carbamazepine, phenytoin, rifampin, St. John's wort).  Reviewed labs from 05/30/15:  SCr 1.55, Weight 96kg, CrCl  48.99.  Dose is appropriate based on CrCl.   Hgb and HCT: 8.7/27.4  A full discussion of the nature of anticoagulants has been carried out.  A benefit/risk analysis has been presented to the patient, so that they understand the justification for choosing anticoagulation with Praxada at this time.  The need for compliance is stressed.  Pt is aware to take the medication twice daily.  Side effects of potential bleeding are discussed, including unusual colored urine or stools, coughing up blood or coffee ground emesis, nose bleeds or serious fall or head trauma.  Discussed signs and symptoms of stroke. The patient should avoid any OTC items containing aspirin or ibuprofen.  Avoid alcohol consumption.   Call if any signs of abnormal bleeding.  Discussed financial obligations and resolved any difficulty in obtaining medication.  Asked pt to call MD if any medications change or he is scheduled for a procedure. Next lab test test in 6 months.  Placed in recall for April 2017.

## 2015-06-27 ENCOUNTER — Telehealth: Payer: Self-pay

## 2015-06-27 NOTE — Telephone Encounter (Signed)
Pt called to c/o legs were weeping fluid today which is new for her.Her weight today 215 lbs per Harriet PhoK Lawrence NP take Extra Torsemide dose tonight,co to ED if worsens for IV Lasix.Made her apt with dr Diona BrownerMcDowell 10/27 at 840 am

## 2015-06-30 ENCOUNTER — Encounter: Payer: Self-pay | Admitting: Cardiology

## 2015-06-30 ENCOUNTER — Ambulatory Visit (INDEPENDENT_AMBULATORY_CARE_PROVIDER_SITE_OTHER): Payer: Medicare Other | Admitting: Cardiology

## 2015-06-30 VITALS — BP 132/68 | HR 82 | Ht 62.0 in | Wt 219.0 lb

## 2015-06-30 DIAGNOSIS — I481 Persistent atrial fibrillation: Secondary | ICD-10-CM

## 2015-06-30 DIAGNOSIS — I509 Heart failure, unspecified: Secondary | ICD-10-CM | POA: Diagnosis not present

## 2015-06-30 DIAGNOSIS — E8779 Other fluid overload: Secondary | ICD-10-CM | POA: Diagnosis not present

## 2015-06-30 DIAGNOSIS — N183 Chronic kidney disease, stage 3 (moderate): Secondary | ICD-10-CM

## 2015-06-30 DIAGNOSIS — I4819 Other persistent atrial fibrillation: Secondary | ICD-10-CM

## 2015-06-30 DIAGNOSIS — Z952 Presence of prosthetic heart valve: Secondary | ICD-10-CM

## 2015-06-30 DIAGNOSIS — I5033 Acute on chronic diastolic (congestive) heart failure: Secondary | ICD-10-CM

## 2015-06-30 DIAGNOSIS — Z954 Presence of other heart-valve replacement: Secondary | ICD-10-CM

## 2015-06-30 DIAGNOSIS — I50810 Right heart failure, unspecified: Secondary | ICD-10-CM

## 2015-06-30 MED ORDER — TORSEMIDE 20 MG PO TABS: ORAL_TABLET | ORAL | Status: DC

## 2015-06-30 NOTE — Progress Notes (Signed)
Cardiology Office Note  Date: 06/30/2015   ID: Sandra Turner, DOB 07/15/1941, MRN [id number]  PCP: [name], MD  Primary Cardiologist: Nona Dell, MD   Chief Complaint  Patient presents with  . Leg Swelling  . Valvular heart disease  . Atrial Fibrillation    History of Present Illness: Sandra Turner is a medically complex 74 y.o. female seen recently by Ms. Lawrence NP. I have not seen her since December 2015. I reviewed interval records including hospitalization in September with pneumonia and acute on chronic diastolic heart failure. She diuresed well with IV Lasix and was transitioned to Demadex 40 mg in the morning and 20 mg in the evening, which she continues on at this point. Her weight has gone up 10 pounds over the last month and she reports worsening leg edema, now with weeping of the right leg. She has also been more short of breath. She denies any major sodium indiscretion but does state that she is eating lots of sweets.  Echocardiogram from July is outlined below. Important to note is the fact that she has severe RV dysfunction.  Lab work from September reviewed, she has CKD stage III, her baseline creatinine has been around 1.5.   She also has persistent atrial fibrillation, at least for the last several months. I see that she is on amiodarone. She has severe left atrial enlargement by echocardiogram, did undergo surgical MAZE with her aortic valve replacement. She continues on anticoagulation. Heart rate is controlled today.  Today we discussed her medications and plan to try and increase outpatient diuretics, although I am concerned that if this is not effective over the next week, or results in further deterioration of renal function, she may require rehospitalization. I do not think that an attempt at restoring sinus rhythm would be very effective at this point, and plan is to stop amiodarone for now.  Past Medical History  Diagnosis Date  . Arthritis   .  Chronic atrial fibrillation (HCC)     a. previously on Pradaxa - on Coumadin since valve surgery.  . Aortic stenosis     a. Severe - s/p pericardial AVR with MAZE 02/04/15.  Marland Kitchen Pneumonia 09/2014  . History of bronchitis 2015  . Family history of adverse reaction to anesthesia     Uncle with MH in the 60's,cousin in the 87's with MH  . 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 COPD (chronic obstructive pulmonary disease) (HCC)   . Normal coronary arteries     a. By cath 11/2014.  . Pulmonary hypertension (HCC)     a. Mod by cath 11/2014.  Marland Kitchen Chronic diastolic CHF (congestive heart failure) (HCC)   . Vocal cord paralysis     a. Dx post-AVR in 01/2015. Required PANDA, intubation during that admission.  . Pleural effusion   . AKI (acute kidney injury) (HCC)     a. H/o AKI after AVR in 01/2015.  . Tracheomalacia   . Anemia     a. 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    Past Surgical History  Procedure Laterality Date  . None    . Left and right heart catheterization with coronary angiogram  N/A 11/01/2014    Procedure: LEFT AND RIGHT HEART CATHETERIZATION WITH CORONARY ANGIOGRAM;  Surgeon: Kathleene Hazelhristopher D McAlhany, MD;  [facility];  Service: Cardiovascular;  Laterality: N/A;  . Aortic valve replacement N/A 01/19/2015    Procedure: AORTIC VALVE REPLACEMENT (AVR);  Surgeon: Purcell Nailslarence H Owen, MD;  [facility];  Service: Open Heart Surgery;  Laterality: N/A;  . Maze N/A 01/19/2015    Procedure: MAZE;  Surgeon: Purcell Nailslarence H Owen, MD;  [facility];  Service: Open Heart Surgery;  Laterality: N/A;  . Aortic root enlargement N/A 01/19/2015    Procedure:  AORTIC ROOT  ENLARGEMENT;  Surgeon: Purcell Nailslarence H Owen, MD;  [facility];  Service: Open Heart Surgery;  Laterality: N/A;  . Tee without cardioversion N/A 01/19/2015    Procedure: TRANSESOPHAGEAL ECHOCARDIOGRAM (TEE);  Surgeon: Purcell Nailslarence H Owen, MD;  [facility];  Service: Open Heart Surgery;  Laterality: N/A;    Current Outpatient Prescriptions  Medication Sig Dispense Refill  . albuterol (PROAIR HFA) 108 (90 BASE) MCG/ACT inhaler Inhale 2 puffs into the lungs 4 (four) times daily as needed for wheezing or shortness of breath.    Marland Kitchen. albuterol (PROVENTIL) (2.5 MG/3ML) 0.083% nebulizer solution Take 3 mLs (2.5 mg total) by nebulization every 4 (four) hours as needed for wheezing or shortness of breath. 75 mL 12  . budesonide (PULMICORT) 0.25 MG/2ML nebulizer solution Take 2 mLs (0.25 mg total) by nebulization 2 (two) times daily. 60 mL 11  . dabigatran (PRADAXA) 150 MG CAPS capsule Take 1 capsule (150 mg total) by mouth 2 (two) times daily. 60 capsule 6  . diltiazem (CARDIZEM CD) 180 MG 24 hr capsule Take 1 capsule (180 mg total) by mouth daily. 30 capsule 1  . docusate sodium (COLACE) 100 MG capsule Take 1 capsule (100 mg total) by mouth daily as needed for mild constipation. 30 capsule 1  . Fluticasone Propionate, Inhal, (FLOVENT DISKUS) 250 MCG/BLIST AEPB Inhale 1 puff into the lungs 2 (two) times daily. 60 each 3  . magnesium oxide (MAG-OX) 400 MG tablet Take 400 mg by mouth 2 (two) times daily.    . potassium chloride (K-DUR) 10 MEQ tablet Take 2 tablets (20 mEq total) by mouth daily. 60 tablet 6  . torsemide (DEMADEX) 20 MG tablet For the next 2 days, 06/30/15 and 07/01/15  take torsemide 60 mg am and 40 mg pm, THEN take 40 mg twice a day 130 tablet 3  . traMADol (ULTRAM) 50 MG tablet Take 1 tablet (50 mg total) by mouth every 6 (six) hours as needed (pain). 50 tablet 0   No current facility-administered medications for this visit.    Allergies:  Bee venom; Latex; and Penicillins   Social History:  The patient  reports that she has never smoked. She has never used smokeless tobacco. She reports that she does not drink alcohol or use illicit drugs.   ROS:  Please see the history of present illness. Otherwise, complete review of systems is positive for worsening leg edema and abdominal girth, shortness of breath. Reports stable appetite..  All other systems are reviewed and negative.   Physical Exam: VS:  BP 132/68 mmHg  Pulse 82  Ht 5\' 2"  (1.575 m)  Wt 219 lb (99.338 kg)  BMI 40.05 kg/m2  SpO2 95%, BMI Body mass index is 40.05 kg/(m^2).  Wt Readings from Last 3 Encounters:  06/30/15 219 lb (99.338 kg)  05/30/15 211 lb 12.8 oz (96.072 kg)  04/28/15 192 lb (87.091  kg)     General: Obese woman seated in wheelchair, no distress. HEENT: Conjunctiva and lids normal, oropharynx clear. Neck: Supple, elevated JVP present, no carotid bruits, no thyromegaly. Lungs: Decreased basilar breath sounds, nonlabored breathing at rest. Cardiac: Irregularly irregular, no S3, 2/6 systolic murmur, no pericardial rub. Abdomen: Protuberant, nontender, bowel sounds present. Extremities: Bilateral firm leg edema and stasis with weeping on the right, distal pulses 1+. Skin: Warm and dry. Musculoskeletal: No kyphosis. Neuropsychiatric: Alert and oriented x3, affect grossly appropriate.   ECG: Recent ECG from September showed rate-controlled atrial fibrillation with rightward axis.  Recent Labwork: 03/12/2015: B Natriuretic Peptide 178.0* 03/13/2015: TSH 4.069 05/23/2015: ALT 17; AST 27 05/27/2015: Magnesium 2.0 05/30/2015: BUN 65*; Creatinine, Ser 1.55*; Hemoglobin 8.1*; Platelets 227; Potassium 3.7; Sodium 137   Other Studies Reviewed Today:  Study Conclusions  - Left ventricle: The cavity size was normal. Wall thickness was increased in a pattern of mild LVH. Systolic function was normal. The estimated ejection fraction was in the range of 55% to 60%. - Mitral valve: Mildly calcified annulus.  Normal thickness leaflets . There was mild regurgitation. - Left atrium: The atrium was severely dilated. - Right ventricle: The cavity size was moderately dilated. It shares the apex with the LV. Systolic function was moderately to severely reduced. RV TAPSE is 1.0 cm. Tricuspid anular systolic tissue velocity is 7 cm/s. - Right atrium: The atrium was severely dilated. - Tricuspid valve: There was mild-moderate regurgitation. - Pulmonary arteries: Systolic pressure was moderately increased. PA peak pressure: 44 mm Hg (S). - Pericardium, extracardiac: There is a large left pleural effusion. - Technically difficult study.  ASSESSMENT AND PLAN:  1. Volume overload in the setting of acute on chronic diastolic heart failure and RV failure as well. We are providing some 4 x 4 dressings to put on her right leg weeping, she will get some more from the drugstore. Does not appear infected today. Demadex will be increased to 60 mg in the morning and 40 mg in the evening for the next 2 days, then down to 40 mg twice daily which is an increase over her prior baseline. Close follow-up in the office is being arranged for next Wednesday at which time she will have a BMET to reassess renal function. Depending on her clinical progress, she will be reassessed at that time for continued outpatient management versus need for hospitalization.  2. Persistent atrial fibrillation, rate controlled. I am stopping amiodarone today. She will continue on Pradaxa and Cardizem CD. Unlikely that cardioversion would be successful at this time. She did have a surgical MAZE procedure, but has severe left atrial enlargement and I am concerned that continuing amiodarone long-term might lead to further pulmonary deterioration.  3. History of severe stenosis status post bioprosthetic AVR in May of this year.  4. Normal coronary arteries documented at cardiac catheterization in March of this year.  Current medicines  were reviewed at length with the patient today.   Orders Placed This Encounter  Procedures  . Basic Metabolic Panel (BMET)    Disposition: FU next Wednesday.   Signed, [name], MD, [facility] 06/30/2015 9:23 AM    [facility] at [facility] [address] [place], [place] [address] Phone: [phone]; Fax: [phone]

## 2015-06-30 NOTE — Patient Instructions (Addendum)
Your physician recommends that you schedule a follow-up appointment in: next Wednesday with Leda GauzeM Lenze PA-C    For the next 2 days (thursday and Friday) take Torsemide (demadex) 60 mg (3 pills in the morning) and 40 mg (2 pills) in the evening.THEN on Saturday start taking 40 mg (2 pills) in the morning and 40 mg (2 pills) in the evening    Next Wednesday morning November 2 nd get STAT blood work at [facility] lab,then see Ms Geni BersLenze in the afternoon      STOP Amiodarone    If you need a refill on your cardiac medications before your next appointment, please call your pharmacy.      Thank you for choosing [facility] !

## 2015-07-06 ENCOUNTER — Ambulatory Visit (INDEPENDENT_AMBULATORY_CARE_PROVIDER_SITE_OTHER): Payer: Medicare Other | Admitting: Physician Assistant

## 2015-07-06 ENCOUNTER — Telehealth: Payer: Self-pay

## 2015-07-06 ENCOUNTER — Other Ambulatory Visit (HOSPITAL_COMMUNITY)
Admission: RE | Admit: 2015-07-06 | Discharge: 2015-07-06 | Disposition: A | Discharge status: Home / Self Care | Payer: Medicare Other | Source: Ambulatory Visit | Attending: Cardiology | Admitting: Cardiology

## 2015-07-06 ENCOUNTER — Encounter: Payer: Self-pay | Admitting: Physician Assistant

## 2015-07-06 VITALS — BP 124/60 | HR 59 | Ht 62.0 in | Wt 212.6 lb

## 2015-07-06 DIAGNOSIS — I482 Chronic atrial fibrillation, unspecified: Secondary | ICD-10-CM

## 2015-07-06 DIAGNOSIS — I272 Other secondary pulmonary hypertension: Secondary | ICD-10-CM | POA: Diagnosis not present

## 2015-07-06 DIAGNOSIS — Z79899 Other long term (current) drug therapy: Secondary | ICD-10-CM

## 2015-07-06 DIAGNOSIS — N183 Chronic kidney disease, stage 3 unspecified: Secondary | ICD-10-CM

## 2015-07-06 DIAGNOSIS — I5033 Acute on chronic diastolic (congestive) heart failure: Secondary | ICD-10-CM | POA: Diagnosis not present

## 2015-07-06 DIAGNOSIS — I350 Nonrheumatic aortic (valve) stenosis: Secondary | ICD-10-CM

## 2015-07-06 DIAGNOSIS — E8770 Fluid overload, unspecified: Secondary | ICD-10-CM | POA: Diagnosis present

## 2015-07-06 LAB — BASIC METABOLIC PANEL
Anion gap: 8 (ref 5–15)
BUN: 30 mg/dL — AB (ref 6–20)
CALCIUM: 8.9 mg/dL (ref 8.9–10.3)
CO2: 37 mmol/L — ABNORMAL HIGH (ref 22–32)
CREATININE: 1.85 mg/dL — AB (ref 0.44–1.00)
Chloride: 92 mmol/L — ABNORMAL LOW (ref 101–111)
GFR calc Af Amer: 30 mL/min — ABNORMAL LOW (ref >60)
GFR, EST NON AFRICAN AMERICAN: 26 mL/min — AB (ref >60)
Glucose, Bld: 114 mg/dL — ABNORMAL HIGH (ref 65–99)
POTASSIUM: 3.9 mmol/L (ref 3.5–5.1)
SODIUM: 137 mmol/L (ref 135–145)

## 2015-07-06 NOTE — Progress Notes (Signed)
Cardiology Office Note   Date:  07/06/2015   ID:  Sandra Turner, DOB [birth year], MRN [id number]  PCP:  Ernestine ConradBLUTH, [name], MD  Cardiologist:  Dr. Nona DellSamuel McDowell  Chief Complaint: Edema    History of Present Illness: Sandra Turner is a 74 y.o. female who presents for one-week follow-up of diastolic heart failure. She was up approximately 10 pounds last week. Demadex was increased to 60 mg in the morning and 40 in the evening for 2 days then 40 mg twice a day which is an increase over her prior baseline. Her amiodarone was also stopped because  of persistent atrial fibrillation CHADSVASC=4. She was continued on Pradaxa and Cardizem CD. It's unlikely cardioversion would be successful. She did have surgical maze procedure but had severe left atrial enlargement. She also has history of severe stenosis status post prosthetic aVR 01/2015 and normal cardiac catheterization in March 2016. She had a hospitalization in September with pneumonia and acute on chronic diastolic heart failure. Echo in July shows severe RV dysfunction. She also has CK D stage III baseline creatinine around 1.5. It was checked Monday and up to 1.85.  Patient comes in today feeling much better. She says her breathing is much better and her edema has gone way down. Her weight is down 7 pounds. She still has weeping from her right leg but it is less. Weight today is 212. It was 219 last week and 211 pounds when she was discharged from the hospital in September. She says her hair isn't falling out any more since she stopped the amiodarone. She continues to use oxygen almost 24 hours a day. Patient inadvertently stopped her diltiazem because she thought was another fluid pill.  Past Medical History  Diagnosis Date  . Arthritis   . Chronic atrial fibrillation (HCC)     a. previously on Pradaxa - on Coumadin since valve surgery.  . Aortic stenosis     a. Severe - s/p pericardial AVR with MAZE 02/04/15.  Marland Kitchen. Pneumonia 09/2014  . History of  bronchitis 2015  . Family history of adverse reaction to anesthesia     Uncle with MH in the 60's,cousin in the 5370's with MH  . 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 COPD (chronic obstructive pulmonary disease) (HCC)   . Normal coronary arteries     a. By cath 11/2014.  . Pulmonary hypertension (HCC)     a. Mod by cath 11/2014.  Marland Kitchen. Chronic diastolic CHF (congestive heart failure) (HCC)   . Vocal cord paralysis     a. Dx post-AVR in 01/2015. Required PANDA, intubation during that admission.  . Pleural effusion   . AKI (acute kidney injury) (HCC)     a. H/o AKI after AVR in 01/2015.  . Tracheomalacia   . Anemia     a. 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    Past Surgical History  Procedure Laterality Date  . None    . Left and right heart catheterization with coronary angiogram N/A 11/01/2014    Procedure: LEFT AND RIGHT HEART CATHETERIZATION WITH CORONARY ANGIOGRAM;  Surgeon: Kathleene Hazelhristopher D McAlhany, MD;  [facility];  Service: Cardiovascular;  Laterality: N/A;  .  Aortic valve replacement N/A 01/19/2015    Procedure: AORTIC VALVE REPLACEMENT (AVR);  Surgeon: Purcell Nails, MD;  Location: [facility] OR;  Service: Open Heart Surgery;  Laterality: N/A;  . Maze N/A 01/19/2015    Procedure: MAZE;  Surgeon: Purcell Nails, MD;  Location: [facility] OR;  Service: Open Heart Surgery;  Laterality: N/A;  . Aortic root enlargement N/A 01/19/2015    Procedure:  AORTIC ROOT ENLARGEMENT;  Surgeon: Purcell Nails, MD;  Location[facility] OR;  Service: Open Heart Surgery;  Laterality: N/A;  . Tee without cardioversion N/A 01/19/2015    Procedure: TRANSESOPHAGEAL ECHOCARDIOGRAM (TEE);   Surgeon: Purcell Nails, MD;  Location: [facility] OR;  Service: Open Heart Surgery;  Laterality: N/A;     Current Outpatient Prescriptions  Medication Sig Dispense Refill  . albuterol (PROAIR HFA) 108 (90 BASE) MCG/ACT inhaler Inhale 2 puffs into the lungs 4 (four) times daily as needed for wheezing or shortness of breath.    Marland Kitchen albuterol (PROVENTIL) (2.5 MG/3ML) 0.083% nebulizer solution Take 3 mLs (2.5 mg total) by nebulization every 4 (four) hours as needed for wheezing or shortness of breath. 75 mL 12  . budesonide (PULMICORT) 0.25 MG/2ML nebulizer solution Take 2 mLs (0.25 mg total) by nebulization 2 (two) times daily. 60 mL 11  . dabigatran (PRADAXA) 150 MG CAPS capsule Take 1 capsule (150 mg total) by mouth 2 (two) times daily. 60 capsule 6  . docusate sodium (COLACE) 100 MG capsule Take 1 capsule (100 mg total) by mouth daily as needed for mild constipation. 30 capsule 1  . Fluticasone Propionate, Inhal, (FLOVENT DISKUS) 250 MCG/BLIST AEPB Inhale 1 puff into the lungs 2 (two) times daily. 60 each 3  . magnesium oxide (MAG-OX) 400 MG tablet Take 400 mg by mouth 2 (two) times daily.    . potassium chloride (K-DUR) 10 MEQ tablet Take 2 tablets (20 mEq total) by mouth daily. 60 tablet 6  . torsemide (DEMADEX) 20 MG tablet For the next 2 days, 06/30/15 and 07/01/15  take torsemide 60 mg am and 40 mg pm, THEN take 40 mg twice a day 130 tablet 3  . traMADol (ULTRAM) 50 MG tablet Take 1 tablet (50 mg total) by mouth every 6 (six) hours as needed (pain). 50 tablet 0   No current facility-administered medications for this visit.    Allergies:   Bee venom; Latex; and Penicillins    Social History:  The patient  reports that she has never smoked. She has never used smokeless tobacco. She reports that she does not drink alcohol or use illicit drugs.   Family History:  The patient's    family history includes Breast cancer in her mother; Cancer in her father; Congestive Heart Failure in her mother;  Malignant hyperthermia in her cousin.    ROS:  Please see the history of present illness.   Otherwise, review of systems are positive for none.   All other systems are reviewed and negative.    PHYSICAL EXAM: VS:  BP 124/60 mmHg  Pulse 59  Ht  (1.575 m)  Wt 212 lb 9.6 oz (96.435 kg)  BMI 38.88 kg/m2  SpO2 97% , BMI Body mass index is 38.88 kg/(m^2). GEN: Well nourished, well developed, in no acute distress Neck: no JVD, HJR, carotid bruits, or masses Cardiac:  Irregular irregular with 1/6 systolic murmur at the left sternal border, no gallop, rubs, thrill or heave,  Respiratory:  clear to auscultation bilaterally, normal work of breathing GI:  soft, nontender, nondistended, + BS MS: no deformity or atrophy Extremities: +2 edema with weeping on the right leg clear fluid, can't palpate distal pulses.  Skin: warm and dry, no rash Neuro:  Strength and sensation are intact    EKG:  EKG is not ordered today.    Recent Labs: 03/12/2015: B Natriuretic Peptide 178.0* 03/13/2015: TSH 4.069 05/23/2015: ALT 17 05/27/2015: Magnesium 2.0 05/30/2015: Hemoglobin 8.1*; Platelets 227 07/06/2015: BUN 30*; Creatinine, Ser 1.85*; Potassium 3.9; Sodium 137    Lipid Panel No results found for: CHOL, TRIG, HDL, CHOLHDL, VLDL, LDLCALC, LDLDIRECT    Wt Readings from Last 3 Encounters:  07/06/15 212 lb 9.6 oz (96.435 kg)  06/30/15 219 lb (99.338 kg)  05/30/15 211 lb 12.8 oz (96.072 kg)      Other studies Reviewed: Additional studies/ records that were reviewed today include and review of the records demonstrates:  2-D echo 03/2015 Study Conclusions  - Left ventricle: The cavity size was normal. Wall thickness was   increased in a pattern of mild LVH. Systolic function was normal.   The estimated ejection fraction was in the range of 55% to 60%. - Mitral valve: Mildly calcified annulus. Normal thickness leaflets   . There was mild regurgitation. - Left atrium: The atrium was severely  dilated. - Right ventricle: The cavity size was moderately dilated. It   shares the apex with the LV. Systolic function was moderately to   severely reduced. RV TAPSE is 1.0 cm. Tricuspid anular systolic   tissue velocity is 7 cm/s. - Right atrium: The atrium was severely dilated. - Tricuspid valve: There was mild-moderate regurgitation. - Pulmonary arteries: Systolic pressure was moderately increased.   PA peak pressure: 44 mm Hg (S). - Pericardium, extracardiac: There is a large left pleural   effusion. - Technically difficult study.     ASSESSMENT AND PLAN:  Acute on chronic diastolic heart failure Patient continues to have fluid overload. She has lost 7 pounds on current diuretics. Her creatinine went up a little to 1.85. I discussed this patient with [name] this morning. We'll continue current diuretics since she's had improvement. I will see her back in 2 weeks or sooner if necessary. Repeat blood work in 2 weeks. Follow-up with [name] in 2 months. Call if 2-3 pound weight gain overnight or increased shortness of breath. 2 g sodium diet reinforced.  Pulmonary hypertension Good blood pressure control.  CKD (chronic kidney disease), stage III We'll continue to monitor kidney function on increased diuretics.  Atrial fibrillation Patient now has chronic atrial fibrillation. Feeling better off amiodarone. Continue Pradaxa. Restart diltiazem 180 mg once daily.  Severe aortic stenosis Status post prosthetic AVR 01/2015    Signed, Jacolyn Reedy, PA-C  07/06/2015 1:54 PM    [facility] [address] [place], [place], [place]  [address] Phone: [phone]; Fax: [phone]

## 2015-07-06 NOTE — Assessment & Plan Note (Signed)
Status post prosthetic AVR 01/2015

## 2015-07-06 NOTE — Telephone Encounter (Signed)
Called pt. No answer. Pt has an appointment this afternoon with Jacolyn ReedyMichele Lenze,  As she will discuss labs and any medication changes with her at her follow up.

## 2015-07-06 NOTE — Assessment & Plan Note (Signed)
Patient continues to have fluid overload. She has lost 7 pounds on current diuretics. Her creatinine went up a little to 1.85. I discussed this patient with Dr. Diona BrownerMcDowell this morning. We'll continue current diuretics since she's had improvement. I will see her back in 2 weeks or sooner if necessary. Repeat blood work in 2 weeks. Follow-up with Dr. Diona BrownerMcDowell in 2 months. Call if 2-3 pound weight gain overnight or increased shortness of breath. 2 g sodium diet reinforced.

## 2015-07-06 NOTE — Assessment & Plan Note (Signed)
We'll continue to monitor kidney function on increased diuretics.

## 2015-07-06 NOTE — Assessment & Plan Note (Addendum)
Patient now has chronic atrial fibrillation. Feeling better off amiodarone. Continue Pradaxa. Restart diltiazem 180 mg once daily.

## 2015-07-06 NOTE — Patient Instructions (Addendum)
Medication Instructions:  Your physician recommends that you continue on your current medications as directed. Please refer to the Current Medication list given to you today. RESUME TAKING CARTIA 180 MG DAILY  Labwork: Your physician recommends that you return for lab work in: 2 weeks   Testing/Procedures: None  Follow-Up: 2 weeks with Jacolyn ReedyMichele Lenze, PA-C 2 Months with Dr Diona BrownerMcDowell  Any Other Special Instructions Will Be Listed Below (If Applicable).     If you need a refill on your cardiac medications before your next appointment, please call your pharmacy.  Thank you for choosing [facility]!

## 2015-07-06 NOTE — Assessment & Plan Note (Signed)
Good blood pressure control 

## 2015-07-06 NOTE — Telephone Encounter (Signed)
-----   Message from Jonelle SidleSamuel G McDowell, MD sent at 07/06/2015 10:29 AM EDT ----- Reviewed. Patient is scheduled to come in this afternoon for a follow-up clinical visit and reassessment of volume status after recent adjustments in diuretics. Creatinine has bumped up from 1.5-1.8, and she will likely need some adjustment in therapy.

## 2015-07-20 ENCOUNTER — Encounter: Payer: Self-pay | Admitting: Physician Assistant

## 2015-07-20 ENCOUNTER — Other Ambulatory Visit: Payer: Self-pay | Admitting: *Deleted

## 2015-07-20 ENCOUNTER — Other Ambulatory Visit (HOSPITAL_COMMUNITY)
Admission: RE | Admit: 2015-07-20 | Discharge: 2015-07-20 | Disposition: A | Discharge status: Home / Self Care | Payer: Medicare Other | Source: Ambulatory Visit | Attending: Thoracic Surgery (Cardiothoracic Vascular Surgery) | Admitting: Thoracic Surgery (Cardiothoracic Vascular Surgery)

## 2015-07-20 ENCOUNTER — Ambulatory Visit (INDEPENDENT_AMBULATORY_CARE_PROVIDER_SITE_OTHER): Payer: Medicare Other | Admitting: Physician Assistant

## 2015-07-20 VITALS — BP 126/62 | HR 73 | Ht 62.0 in | Wt 213.6 lb

## 2015-07-20 DIAGNOSIS — I5033 Acute on chronic diastolic (congestive) heart failure: Secondary | ICD-10-CM | POA: Diagnosis not present

## 2015-07-20 DIAGNOSIS — N183 Chronic kidney disease, stage 3 unspecified: Secondary | ICD-10-CM

## 2015-07-20 DIAGNOSIS — I482 Chronic atrial fibrillation, unspecified: Secondary | ICD-10-CM

## 2015-07-20 DIAGNOSIS — Z029 Encounter for administrative examinations, unspecified: Secondary | ICD-10-CM | POA: Diagnosis present

## 2015-07-20 DIAGNOSIS — Z79899 Other long term (current) drug therapy: Secondary | ICD-10-CM

## 2015-07-20 LAB — BASIC METABOLIC PANEL
Anion gap: 8 (ref 5–15)
BUN: 32 mg/dL — ABNORMAL HIGH (ref 6–20)
CALCIUM: 8.9 mg/dL (ref 8.9–10.3)
CO2: 33 mmol/L — AB (ref 22–32)
CREATININE: 2.09 mg/dL — AB (ref 0.44–1.00)
Chloride: 94 mmol/L — ABNORMAL LOW (ref 101–111)
GFR, EST AFRICAN AMERICAN: 26 mL/min — AB (ref >60)
GFR, EST NON AFRICAN AMERICAN: 22 mL/min — AB (ref >60)
Glucose, Bld: 140 mg/dL — ABNORMAL HIGH (ref 65–99)
Potassium: 4.2 mmol/L (ref 3.5–5.1)
SODIUM: 135 mmol/L (ref 135–145)

## 2015-07-20 NOTE — Patient Instructions (Signed)
Your physician recommends that you keep your scheduled appointment with Dr. Diona BrownerMcDowell.   Your physician has recommended you make the following change in your medication:   Increase Demadex to 60 mg in the AM and 40 mg in the PM  For 2 Days and then return to 40 mg Twice Daily.  Increase Potassium to 3 Tabs for the next 2 Days and then return to Twice Daily   If you need a refill on your cardiac medications before your next appointment, please call your pharmacy.  Thank you for choosing [facility]!

## 2015-07-20 NOTE — Assessment & Plan Note (Addendum)
Patient's heart rate is well controlled back on Cartia XT 180 mg daily, also on Pradaxa

## 2015-07-20 NOTE — Assessment & Plan Note (Signed)
Patient's weight is up 1 pound since I last saw her in 07/06/15. She continues to have edema. She had blood work drawn today but I don't have those results back yet. Increase Demadex to 60 mg in the morning and 40 in the evening for 2 days then back to 40 mg twice a day. If her renal function is worse we may need to change this dosing. She already has follow-up with Dr. Diona BrownerMcDowell in January. I discussed once again the importance of a 2 g sodium diet especially going into the holidays. She is to call if she has weight gain of 2 or 3 pounds overnight.

## 2015-07-20 NOTE — Assessment & Plan Note (Signed)
Renal function checked today. Last creatinine was 1.85. Before that was in the 1.5 range. We'll adjust diuretics as needed based on renal function.

## 2015-07-20 NOTE — Progress Notes (Signed)
Cardiology Office Note   Date:  07/20/2015   ID:  Sandra Turner, DOB 26-Nov-1940, MRN [id number]  PCP:  [name], MD  Cardiologist:  Dr. Nona Dell  Chief Complaint: Edema    History of Present Illness: Sandra Turner is a 74 y.o. female who presents for one-week follow-up of diastolic heart failure. I saw her on 07/06/15 and she had lost 7 pounds since a week before. Her creatinine went up a little to 1.85. [name] recommended continuing same diuretic dose of Demadex 40 mg twice a day which is an increase over her prior baseline.   Her amiodarone was also stopped because of persistent atrial fibrillation CHADSVASC=4. She was continued on Pradaxa and Cardizem CD. It's unlikely cardioversion would be successful. She did have surgical maze procedure but had severe left atrial enlargement. She also has history of severe stenosis status post prosthetic aVR 01/2015 and normal cardiac catheterization in March 2016. She had a hospitalization in September with pneumonia and acute on chronic diastolic heart failure. Echo in July shows severe RV dysfunction. She also has CKD stage III baseline creatinine around 1.5. It was checked Monday and up to 1.85.  Patient comes in today feeling about the same. Her weight today is 213 pounds, last week it was 212 pounds. Her breathing is stable on oxygen and her leg weeping has decreased. She had blood work this morning but I don't have the results back yet.  Wt Readings from Last 3 Encounters:  07/20/15 213 lb 9.6 oz (96.888 kg)  07/06/15 212 lb 9.6 oz (96.435 kg)  06/30/15 219 lb (99.338 kg)     Past Medical History  Diagnosis Date  . Arthritis   . Chronic atrial fibrillation (HCC)     a. previously on Pradaxa - on Coumadin since valve surgery.  . Aortic stenosis     a. Severe - s/p pericardial AVR with MAZE 02/04/15.  Marland Kitchen Pneumonia 09/2014  . History of bronchitis 2015  . Family history of adverse reaction to anesthesia     Uncle with MH in the  60's,cousin in the 43's with MH  . 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 COPD (chronic obstructive pulmonary disease) (HCC)   . Normal coronary arteries     a. By cath 11/2014.  . Pulmonary hypertension (HCC)     a. Mod by cath 11/2014.  Marland Kitchen Chronic diastolic CHF (congestive heart failure) (HCC)   . Vocal cord paralysis     a. Dx post-AVR in 01/2015. Required PANDA, intubation during that admission.  . Pleural effusion   . AKI (acute kidney injury) (HCC)     a. H/o AKI after AVR in 01/2015.  . Tracheomalacia   . Anemia     a. 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    Past Surgical History  Procedure Laterality Date  . None    . Left and right heart catheterization with coronary angiogram N/A 11/01/2014    Procedure: LEFT AND RIGHT HEART CATHETERIZATION WITH CORONARY ANGIOGRAM;  Surgeon: [name], MD;  Location: [facility] CATH LAB;  Service: Cardiovascular;  Laterality: N/A;  . Aortic valve replacement N/A 01/19/2015    Procedure: AORTIC VALVE REPLACEMENT (  AVR);  Surgeon: Purcell Nailslarence H Owen, MD;  [facility];  Service: Open Heart Surgery;  Laterality: N/A;  . Maze N/A 01/19/2015    Procedure: MAZE;  Surgeon: Purcell Nailslarence H Owen, MD;  [facility];  Service: Open Heart Surgery;  Laterality: N/A;  . Aortic root enlargement N/A 01/19/2015    Procedure:  AORTIC ROOT ENLARGEMENT;  Surgeon: Purcell Nailslarence H Owen, MD;  [facility];  Service: Open Heart Surgery;  Laterality: N/A;  . Tee without cardioversion N/A 01/19/2015    Procedure: TRANSESOPHAGEAL ECHOCARDIOGRAM (TEE);  Surgeon: Purcell Nailslarence H Owen, MD;  [facility];  Service: Open Heart Surgery;  Laterality: N/A;      Current Outpatient Prescriptions  Medication Sig Dispense Refill  . albuterol (PROAIR HFA) 108 (90 BASE) MCG/ACT inhaler Inhale 2 puffs into the lungs 4 (four) times daily as needed for wheezing or shortness of breath.    Marland Kitchen. albuterol (PROVENTIL) (2.5 MG/3ML) 0.083% nebulizer solution Take 3 mLs (2.5 mg total) by nebulization every 4 (four) hours as needed for wheezing or shortness of breath. 75 mL 12  . budesonide (PULMICORT) 0.25 MG/2ML nebulizer solution Take 2 mLs (0.25 mg total) by nebulization 2 (two) times daily. 60 mL 11  . CARTIA XT 180 MG 24 hr capsule     . dabigatran (PRADAXA) 150 MG CAPS capsule Take 1 capsule (150 mg total) by mouth 2 (two) times daily. 60 capsule 6  . docusate sodium (COLACE) 100 MG capsule Take 1 capsule (100 mg total) by mouth daily as needed for mild constipation. 30 capsule 1  . Fluticasone Propionate, Inhal, (FLOVENT DISKUS) 250 MCG/BLIST AEPB Inhale 1 puff into the lungs 2 (two) times daily. 60 each 3  . magnesium oxide (MAG-OX) 400 MG tablet Take 400 mg by mouth 2 (two) times daily.    . potassium chloride (K-DUR) 10 MEQ tablet Take 2 tablets (20 mEq total) by mouth daily. 60 tablet 6  . torsemide (DEMADEX) 20 MG tablet For the next 2 days, 06/30/15 and 07/01/15  take torsemide 60 mg am and 40 mg pm, THEN take 40 mg twice a day 130 tablet 3  . traMADol (ULTRAM) 50 MG tablet Take 1 tablet (50 mg total) by mouth every 6 (six) hours as needed (pain). 50 tablet 0   No current facility-administered medications for this visit.    Allergies:   Bee venom; Latex; and Penicillins    Social History:  The patient  reports that she has never smoked. She has never used smokeless tobacco. She reports that she does not drink alcohol or use illicit drugs.   Family History:  The patient's    family history includes Breast cancer in her mother; Cancer in her father; Congestive Heart Failure in her mother; Malignant hyperthermia in her cousin.    ROS:  Please see  the history of present illness.   Otherwise, review of systems are positive for none.   All other systems are reviewed and negative.    PHYSICAL EXAM: VS:  BP 126/62 mmHg  Pulse 73  Ht 5\' 2"  (1.575 m)  Wt 213 lb 9.6 oz (96.888 kg)  BMI 39.06 kg/m2  SpO2 98% , BMI Body mass index is 39.06 kg/(m^2). GEN: Obese, in no acute distress Neck: no JVD, HJR, carotid bruits, or masses Cardiac:  Irregular irregular with 1/6 systolic murmur at the left sternal border, no gallop, rubs, thrill or heave,  Respiratory:  clear to auscultation bilaterally, normal work of breathing GI: soft, nontender, nondistended, +  BS MS: no deformity or atrophy Extremities: +1-2 edema with weeping on the right leg clear fluid, can't palpate distal pulses.  Skin: warm and dry, no rash Neuro:  Strength and sensation are intact    EKG:  EKG is not ordered today.    Recent Labs: 03/12/2015: B Natriuretic Peptide 178.0* 03/13/2015: TSH 4.069 05/23/2015: ALT 17 05/27/2015: Magnesium 2.0 05/30/2015: Hemoglobin 8.1*; Platelets 227 07/06/2015: BUN 30*; Creatinine, Ser 1.85*; Potassium 3.9; Sodium 137    Lipid Panel No results found for: CHOL, TRIG, HDL, CHOLHDL, VLDL, LDLCALC, LDLDIRECT    Wt Readings from Last 3 Encounters:  07/20/15 213 lb 9.6 oz (96.888 kg)  07/06/15 212 lb 9.6 oz (96.435 kg)  06/30/15 219 lb (99.338 kg)      Other studies Reviewed: Additional studies/ records that were reviewed today include and review of the records demonstrates:  2-D echo 03/2015 Study Conclusions  - Left ventricle: The cavity size was normal. Wall thickness was   increased in a pattern of mild LVH. Systolic function was normal.   The estimated ejection fraction was in the range of 55% to 60%. - Mitral valve: Mildly calcified annulus. Normal thickness leaflets   . There was mild regurgitation. - Left atrium: The atrium was severely dilated. - Right ventricle: The cavity size was moderately dilated. It   shares the apex  with the LV. Systolic function was moderately to   severely reduced. RV TAPSE is 1.0 cm. Tricuspid anular systolic   tissue velocity is 7 cm/s. - Right atrium: The atrium was severely dilated. - Tricuspid valve: There was mild-moderate regurgitation. - Pulmonary arteries: Systolic pressure was moderately increased.   PA peak pressure: 44 mm Hg (S). - Pericardium, extracardiac: There is a large left pleural   effusion. - Technically difficult study.     ASSESSMENT AND PLAN:  Acute on chronic diastolic heart failure Patient's weight is up 1 pound since I last saw her in 07/06/15. She continues to have edema. She had blood work drawn today but I don't have those results back yet. Increase Demadex to 60 mg in the morning and 40 in the evening for 2 days then back to 40 mg twice a day. If her renal function is worse we may need to change this dosing. She already has follow-up with [name] in January. I discussed once again the importance of a 2 g sodium diet especially going into the holidays. She is to call if she has weight gain of 2 or 3 pounds overnight.  Atrial fibrillation Patient's heart rate is well controlled back on Cartia XT 180 mg daily, also on Pradaxa  CKD (chronic kidney disease), stage III Renal function checked today. Last creatinine was 1.85. Before that was in the 1.5 range. We'll adjust diuretics as needed based on renal function.     Elson Clan, PA-C  07/20/2015 1:30 PM    [facility] [address] [place], [place], [place]  [address] Phone: [phone]; Fax: [phone]

## 2015-07-21 ENCOUNTER — Telehealth: Payer: Self-pay | Admitting: *Deleted

## 2015-07-21 DIAGNOSIS — Z79899 Other long term (current) drug therapy: Secondary | ICD-10-CM

## 2015-07-21 NOTE — Telephone Encounter (Signed)
Orders placed. Spoke with Pt regarding labs and medication adjustments. Pt voiced understanding.

## 2015-07-21 NOTE — Telephone Encounter (Signed)
Spoke with Jacolyn ReedyMichele Lenze, PA-C. Lab results given. Orders received and repeated. Patient will have BMET done in 2 weeks. Patient to resume Demadex 40 mg Two Times Daily. Called to inform patient. No answer left message to call back.

## 2015-07-25 ENCOUNTER — Encounter: Payer: Self-pay | Admitting: Thoracic Surgery (Cardiothoracic Vascular Surgery)

## 2015-07-25 ENCOUNTER — Ambulatory Visit (INDEPENDENT_AMBULATORY_CARE_PROVIDER_SITE_OTHER): Payer: Self-pay | Admitting: Thoracic Surgery (Cardiothoracic Vascular Surgery)

## 2015-07-25 VITALS — BP 117/56 | HR 80 | Resp 20 | Ht 62.0 in | Wt 213.0 lb

## 2015-07-25 DIAGNOSIS — Z953 Presence of xenogenic heart valve: Secondary | ICD-10-CM

## 2015-07-25 DIAGNOSIS — Z8679 Personal history of other diseases of the circulatory system: Secondary | ICD-10-CM

## 2015-07-25 DIAGNOSIS — Z954 Presence of other heart-valve replacement: Secondary | ICD-10-CM

## 2015-07-25 DIAGNOSIS — Z9889 Other specified postprocedural states: Secondary | ICD-10-CM

## 2015-07-25 NOTE — Progress Notes (Signed)
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 She was last seen here in our office on 03/22/2015.  Routine follow-up transthoracic echocardiogram performed 03/16/2015 revealed normal functioning bioprosthetic tissue valve in the aortic position with low transvalvular gradient (Peak velocity 2.7 Turner/s corresponding to mean transvalvular gradient estimated 13 mmHg) and normal left ventricular systolic function with ejection fraction estimated 55-60%.  Echocardiogram also demonstrated the presence of moderate right ventricular chamber enlargement with moderate to severe right ventricular dysfunction and pulmonary artery pressures estimated at 44 mmHg.  Since then the patient has been seen in follow-up by Dr. Kendrick Turner for management of her chronic lung disease including chronic asthmatic bronchitis with history of mold exposure.  In September the patient was hospitalized at [facility] with an acute exacerbation of chronic diastolic congestive heart failure, right heart failure, and pneumonia. Following this the patient states that she did well through the month of October when she was feeling much better. She has been seen on several occasions by Dr. Diona Turner and Sandra Turner at [facility], Most recently on 07/20/2015. She has been taken off of amiodarone because of hair loss.  She returns to our office for routine follow-up today. She remains quite limited by symptoms of chronic exertional shortness of breath. She gets short of breath with very mild activity. She cannot lie flat in bed. She has chronic bilateral lower extremity edema. She states  that she typically has a mild cough in the morning when she first gets up that is nonproductive. Otherwise she denies any symptoms of chronic cough or wheezing. She has not had any chest pain or chest tightness. Weight has been stable for the last several weeks. She denies any palpitations or dizzy spells.  Current Outpatient Prescriptions  Medication Sig Dispense Refill  . albuterol (PROAIR HFA) 108 (90 BASE) MCG/ACT inhaler Inhale 2 puffs into the lungs 4 (four) times daily as needed for wheezing or shortness of breath.    Marland Kitchen albuterol (PROVENTIL) (2.5 MG/3ML) 0.083% nebulizer solution Take 3 mLs (2.5 mg total) by nebulization every 4 (four) hours as needed for wheezing or shortness of breath. 75 mL 12  . budesonide (PULMICORT) 0.25 MG/2ML nebulizer solution Take 2 mLs (0.25 mg total) by nebulization 2 (two) times daily. 60 mL 11  . CARTIA XT 180 MG 24 hr capsule     . dabigatran (PRADAXA) 150 MG CAPS capsule Take 1 capsule (150 mg total) by mouth 2 (two) times daily. 60 capsule 6  . docusate sodium (COLACE) 100 MG capsule Take 1 capsule (100 mg total) by mouth daily as needed for mild constipation. 30 capsule 1  . Fluticasone Propionate, Inhal, (FLOVENT DISKUS) 250 MCG/BLIST AEPB Inhale 1 puff into the lungs 2 (two) times daily. 60 each 3  . magnesium oxide (MAG-OX) 400 MG tablet Take 400 mg by mouth 2 (two) times daily.    . potassium chloride (K-DUR) 10 MEQ tablet Take 2 tablets (20 mEq total) by mouth daily. 60 tablet 6  . torsemide (DEMADEX) 20 MG tablet For the next 2 days,  06/30/15 and 07/01/15  take torsemide 60 mg am and 40 mg pm, THEN take 40 mg twice a day 130 tablet 3  . traMADol (ULTRAM) 50 MG tablet Take 1 tablet (50 mg total) by mouth every 6 (six) hours as needed (pain). 50 tablet 0   No current facility-administered medications for this visit.      Physical Exam:   BP 117/56 mmHg  Pulse 80  Resp 20  Ht  (1.575 Turner)  Wt 213 lb (96.616 kg)  BMI 38.95 kg/m2  SpO2  96%  General:  Morbidly obese and chronically ill-appearing but no acute distress  Chest:   Clear to auscultation  CV:   Regular rate and rhythm with systolic murmur heard best at left upper sternal border  Incisions:  Completely healed, sternum is stable  Abdomen:  Soft nontender  Extremities:  Warm and well-perfused, moderate bilateral lower extremity edema  Diagnostic Tests:  2 channel telemetry rhythm strip demonstrates what appears to be atrial fibrillation with heart rates estimated 80 bpm   Transthoracic Echocardiography  Patient:  Sandra Turner, Sandra Turner MR #:    [id number] Study Date: 03/16/2015 Gender:   F Age:    73 Height:   157.5 cm Weight:   105.7 kg BSA:    2.21 Turner^2 Pt. Status: Room:    [address]  Sandra Turner [name]  QMV, HQION [id number] Sandra Turner, Sandra Turner REFERRING  [place], [place] Turner PERFORMING  Chmg, Sandra Turner SONOGRAPHER Sandra Turner  cc:  ------------------------------------------------------------------- LV EF: 55% -  60%  ------------------------------------------------------------------- Indications:   Dyspnea 786.09.  ------------------------------------------------------------------- History:  PMH:  Atrial fibrillation. Aortic valve disease. Chronic obstructive pulmonary disease.  ------------------------------------------------------------------- Study Conclusions  - Left ventricle: The cavity size was normal. Wall thickness was increased in a pattern of mild LVH. Systolic function was normal. The estimated ejection fraction was in the range of 55% to 60%. - Mitral valve: Mildly calcified annulus. Normal thickness leaflets . There was mild regurgitation. - Left atrium: The atrium was severely dilated. - Right ventricle: The cavity size was moderately dilated. It shares the apex with the LV. Systolic function was moderately to severely reduced. RV TAPSE is 1.0 cm.  Tricuspid anular systolic tissue velocity is 7 cm/s. - Right atrium: The atrium was severely dilated. - Tricuspid valve: There was mild-moderate regurgitation. - Pulmonary arteries: Systolic pressure was moderately increased. PA peak pressure: 44 mm Hg (S). - Pericardium, extracardiac: There is a large left pleural effusion. - Technically difficult study.  ------------------------------------------------------------------- Labs, prior tests, procedures, and surgery: Aortic valve replacement with a bioprosthetic valve.  Transthoracic echocardiography. Turner-mode, complete 2D, spectral Doppler, and color Doppler. Birthdate: Patient birthdate: Sep 29, 1940. Age: Patient is 74 yr old. Sex: Gender: female. BMI: 42.6 kg/Turner^2. Blood pressure:   139/52 Patient status: Inpatient. Study date: Study date: 03/16/2015. Study time: 02:12 PM. Location: Bedside.  -------------------------------------------------------------------  ------------------------------------------------------------------- Left ventricle: The cavity size was normal. Wall thickness was increased in a pattern of mild LVH. Systolic function was normal. The estimated ejection fraction was in the range of 55% to 60%. Indeterminate diastolic function. Septal motion is consistent with prior cardiac surgery. Images were inadequate for LV wall motion assessment.  ------------------------------------------------------------------- Aortic valve: There is a 23 mm [facility] Ease bovine pericardial tissue valve in the AV position. Poorly visualized. Doppler:  There was no stenosis.  There was no significant regurgitation.  VTI ratio of LVOT to aortic valve: 0.47. Mean velocity ratio of LVOT to aortic valve:  0.51.  Mean gradient (S): 13 mm Hg. Peak gradient (S): 28 mm Hg.  ------------------------------------------------------------------- Aorta: Aortic root: The aortic root was normal in  size.  ------------------------------------------------------------------- Mitral valve:  Mildly calcified annulus. Normal thickness leaflets . Doppler:  There was no evidence for stenosis.  There was mild regurgitation.  Mean gradient (D): 6 mm Hg. Peak gradient (D): 12 mm Hg.  ------------------------------------------------------------------- Left atrium: The atrium was severely dilated.  ------------------------------------------------------------------- Right ventricle: The cavity size was moderately dilated. It shares the apex with the LV. Systolic function was moderately to severely reduced. RV TAPSE is 1.0 cm. Tricuspid anular systolic tissue velocity is 7 cm/s.  ------------------------------------------------------------------- Pulmonic valve:  Not well visualized. Doppler:  There was no evidence for stenosis.  There was no significant regurgitation.  ------------------------------------------------------------------- Tricuspid valve:  Normal thickness leaflets. Doppler:  There was no evidence for stenosis.  There was mild-moderate regurgitation.  ------------------------------------------------------------------- Pulmonary artery:  Systolic pressure was moderately increased.  ------------------------------------------------------------------- Right atrium: The atrium was severely dilated.  ------------------------------------------------------------------- Pericardium: There is a large left pleural effusion.  ------------------------------------------------------------------- Systemic veins: Inferior vena cava: The vessel was normal in size. The respirophasic diameter changes were in the normal range (= 50%), consistent with normal central venous pressure.  ------------------------------------------------------------------- Measurements  Left ventricle             Value     Reference LV ID, ED, PLAX chordal         43.8  mm   43 - 52 LV ID, ES, PLAX chordal        26   mm   23 - 38 LV fx shortening, PLAX chordal     41   %   >=29 LV PW thickness, ED          10   mm   --------- IVS/LV PW ratio, ED          1       <=1.3 LV ejection time            350  ms   ---------  Ventricular septum           Value     Reference IVS thickness, ED           10   mm   ---------  LVOT                  Value     Reference LVOT ID, S               18   mm   --------- LVOT area               2.54  cm^2  --------- LVOT peak velocity, S         127.8 cm/s  --------- LVOT mean velocity, S         82.8  cm/s  --------- LVOT VTI, S              26.21 cm   --------- LVOT peak gradient, S         7   mm Hg ---------  Aortic valve              Value     Reference Aortic valve peak velocity, S     266.82 cm/s  --------- Aortic valve mean velocity, S     162.22 cm/s  --------- Aortic valve VTI, S          55.69 cm   ---------  Aortic mean gradient, S        13   mm Hg --------- Aortic peak gradient, S        28   mm Hg --------- VTI ratio, LVOT/AV           0.47     --------- Velocity ratio, mean, LVOT/AV     0.51     ---------  Aorta                 Value     Reference Aortic root ID, ED           24   mm   ---------  Left atrium              Value     Reference LA ID, A-P, ES             54   mm   --------- LA ID/bsa, A-P         (H)   2.44  cm/Turner^2 <=2.2 LA volume/bsa, S            41.1  ml/Turner^2 ---------  Mitral valve              Value     Reference Mitral E-wave peak velocity       172  cm/s  --------- Mitral mean velocity, D        115  cm/s  --------- Mitral deceleration time    (H)   285  ms   150 - 230 Mitral mean gradient, D        6   mm Hg --------- Mitral peak gradient, D        12   mm Hg ---------  Pulmonary arteries           Value     Reference PA pressure, S, DP       (H)   44   mm Hg <=30  Legend: (L) and (H) mark values outside specified reference range.  ------------------------------------------------------------------- Prepared and Electronically Authenticated by  Patrick JupiterJonathon Branch, Turner.D. 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 Most recent follow-up echocardiogram demonstrates normal functioning bioprosthetic tissue valve in aortic position with relatively low transvalvular gradients and normal left ventricular systolic function.  She does have moderate to severe right ventricular dysfunction and she continues to experience symptoms of chronic diastolic and right sided congestive heart failure, New York Heart Association functional class IIIB.  She remains in rate controlled atrial fibrillation on chronic anticoagulation using Pradaxa.  Amiodarone has been discontinued. Symptoms related to chronic asthmatic bronchitis with history of heavy mold exposure remained stable, and specifically the patient denies persistent cough or significant mucus production.   Plan:  We have not recommended any changes to the patient's current medications at this time.  I think it would be reasonable to consider an attempt at DC cardioversion since the patient underwent maze procedure at the time of her aortic valve replacement last May. Although I would agree that she would remain at significant risk for recurrence,  maintenance of sinus rhythm might improve the patient's symptoms considerably.  We will have the patient return for follow-up and rhythm check in 6 months. All of her questions have been addressed.  I spent in excess of 15 minutes during the conduct of this office consultation and >50% of this time involved  direct face-to-face encounter with the patient for counseling and/or coordination of their care.    Salvatore Decent. Cornelius Moras, MD 07/25/2015 1:27 PM

## 2015-07-25 NOTE — Patient Instructions (Signed)
Continue all previous medications without any changes at this time  

## 2015-08-04 IMAGING — DX DG CHEST 2V
2 series · 2 of 2 positions shown · non-contrast
Comparison: Chest radiograph dated 02/15/2015

ADDENDUM:
Please note the is a two view radiograph of the chest. On the
lateral projection fluid is noted within the fissure.
CLINICAL DATA: 73-year-old female with aortic valve replacement,
history of pneumonia and atrial fibrillation.

EXAM:
CHEST  2 VIEW

[chest ap]
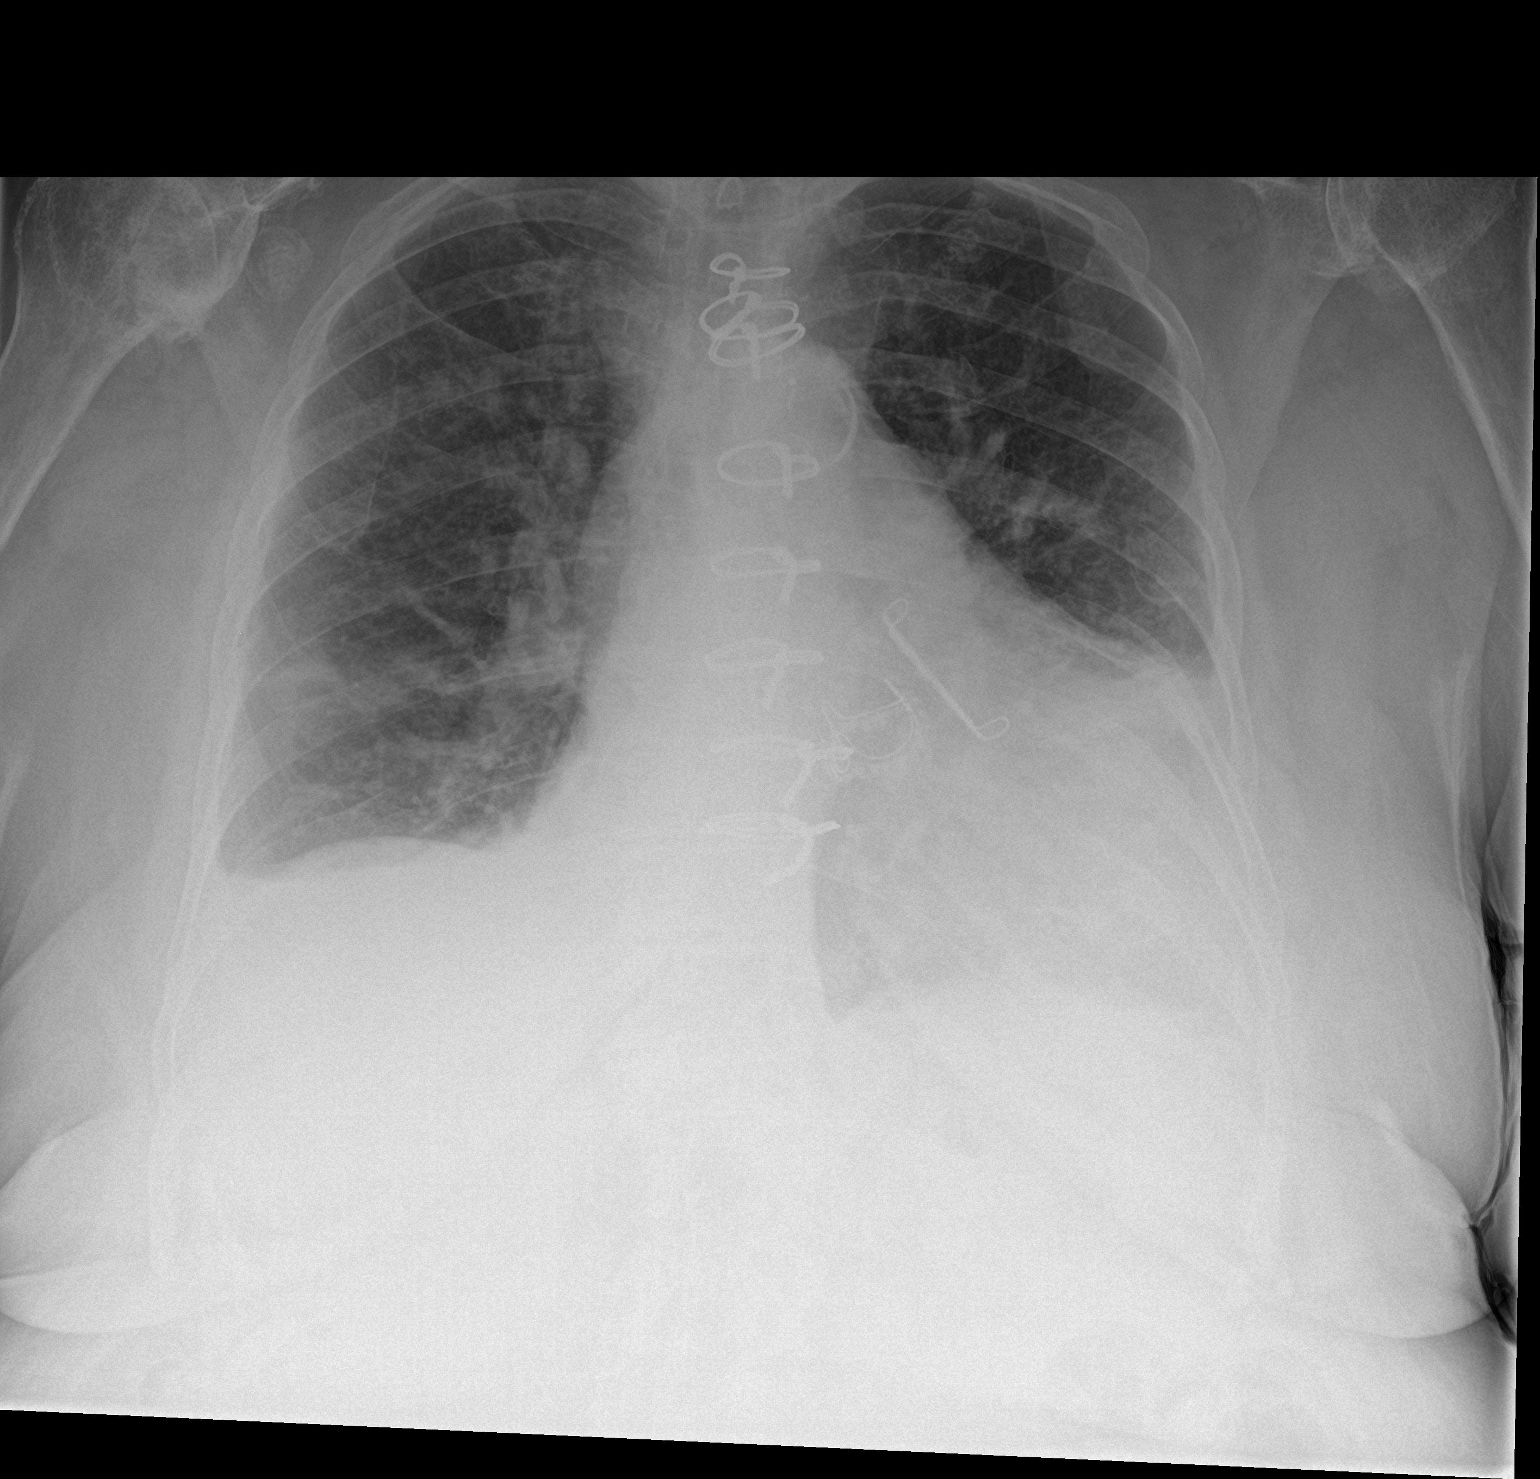

[chest lat]
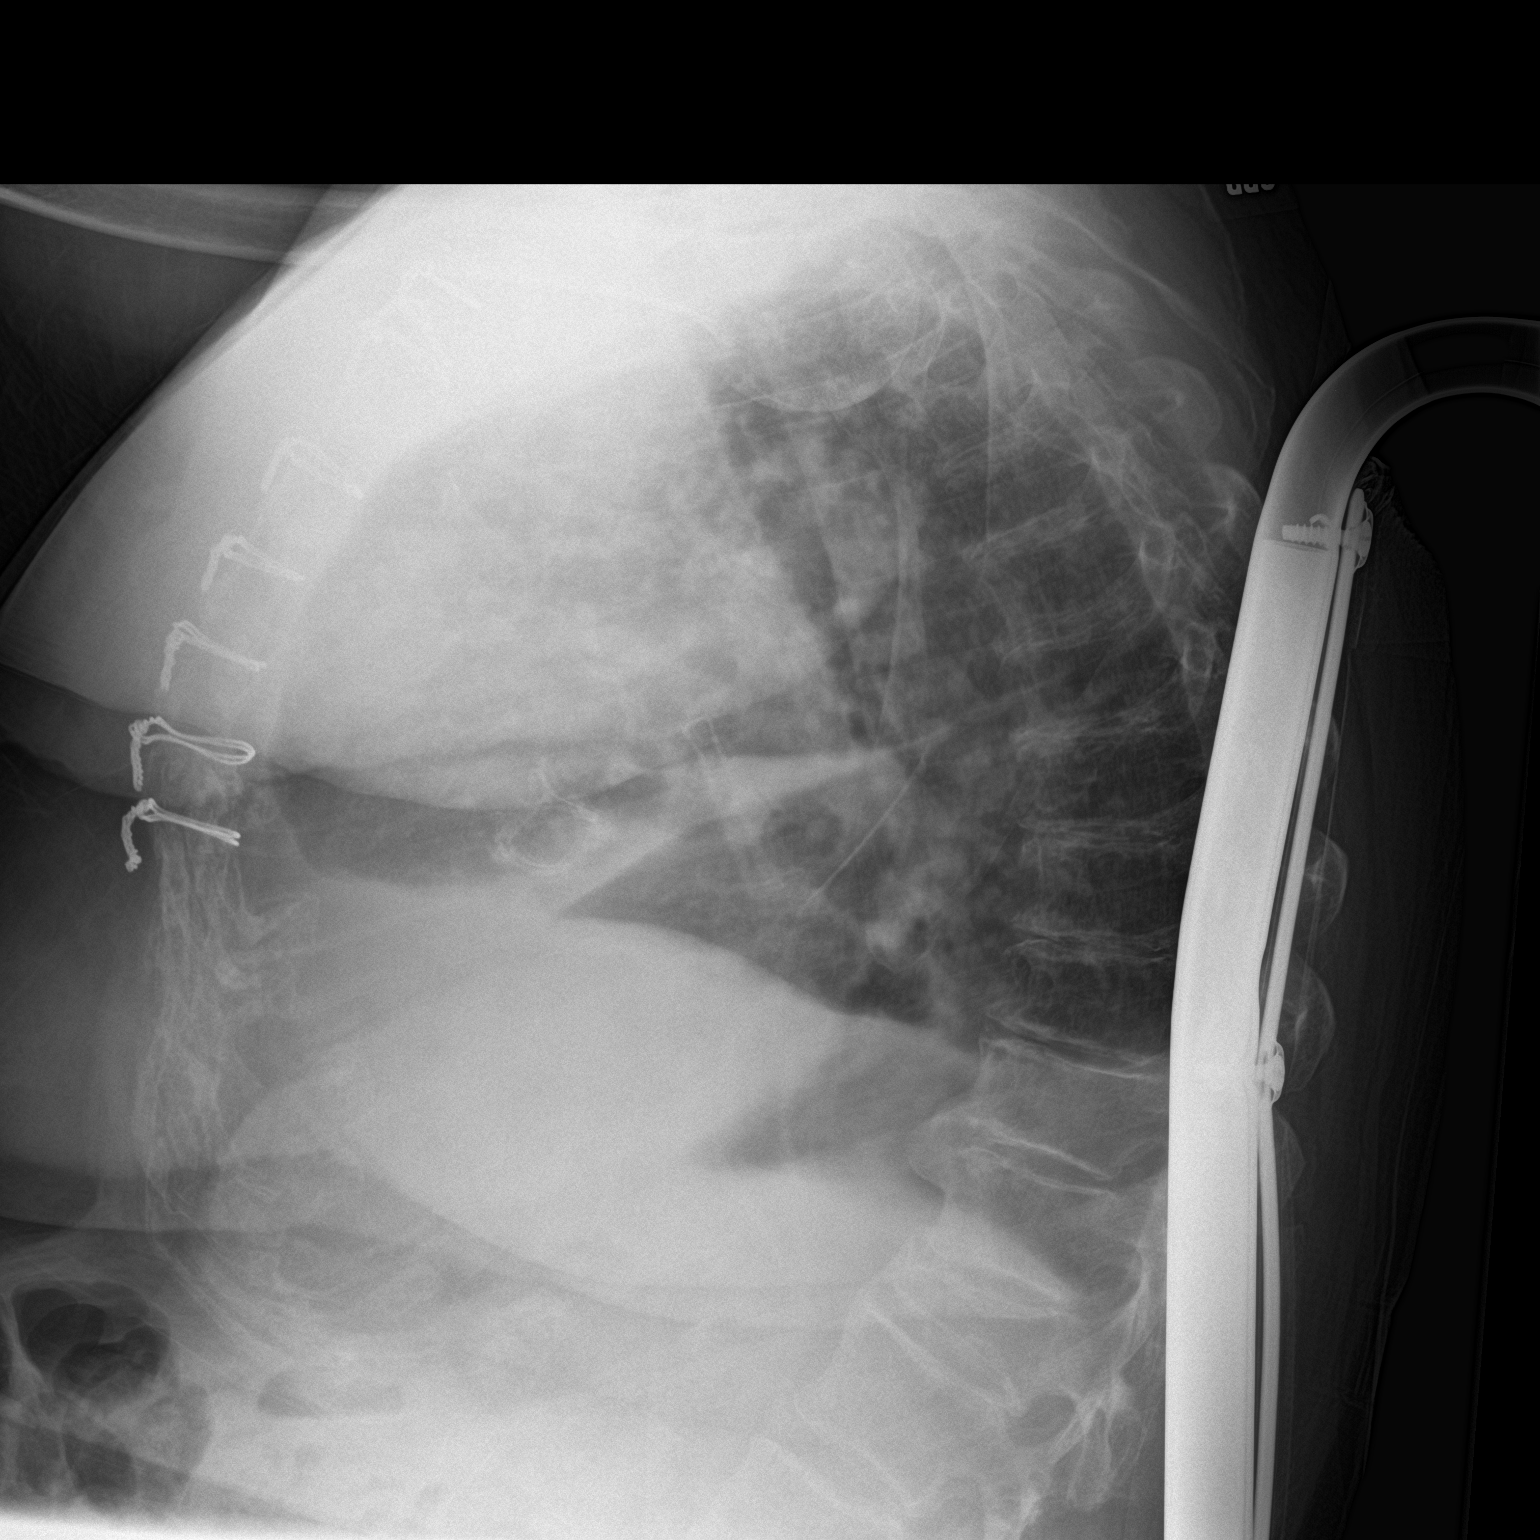

[2 of 2 positions shown; findings below may reference images not displayed]

FINDINGS: Single-view of the chest few droplets demonstrate an area increased
density the left lower lung field likely atelectatic changes.
Superimposed pneumonia not excluded. There are small bilateral
pleural effusions, decreased compared to the prior study. There is
stable cardiomegaly. Median sternotomy wires, mechanical heart valve
and left atrial appendage indication again noted. The osseous
structures are otherwise unremarkable.
IMPRESSION: Small bilateral pleural effusions, decreased compared to prior
studies.

Left lower lung zone atelectasis/pneumonia. Clinical correlation and
follow-up recommended.

## 2015-08-05 LAB — BASIC METABOLIC PANEL
BUN: 36 mg/dL — ABNORMAL HIGH (ref 7–25)
CALCIUM: 9 mg/dL (ref 8.6–10.4)
CO2: 32 mmol/L — AB (ref 20–31)
CREATININE: 2.15 mg/dL — AB (ref 0.60–0.93)
Chloride: 93 mmol/L — ABNORMAL LOW (ref 98–110)
Glucose, Bld: 121 mg/dL — ABNORMAL HIGH (ref 65–99)
Potassium: 4 mmol/L (ref 3.5–5.3)
Sodium: 136 mmol/L (ref 135–146)

## 2015-08-08 ENCOUNTER — Other Ambulatory Visit: Payer: Self-pay | Admitting: Pulmonary Disease

## 2015-08-09 ENCOUNTER — Encounter: Payer: Medicare Other | Admitting: Adult Health

## 2015-08-09 ENCOUNTER — Telehealth: Payer: Self-pay | Admitting: Cardiology

## 2015-08-09 ENCOUNTER — Telehealth: Payer: Self-pay | Admitting: Adult Health

## 2015-08-09 NOTE — Telephone Encounter (Signed)
    ERROR. No show 

## 2015-08-09 NOTE — Addendum Note (Signed)
Addended by: Kerney ElbePINNIX, LUKISHA G on: 08/09/2015 10:20 AM   Modules accepted: Orders

## 2015-08-09 NOTE — Progress Notes (Signed)
Cardiology Office Note   Date:  08/09/2015   ERROR No show

## 2015-08-09 NOTE — Telephone Encounter (Signed)
Mrs. Sandra Turner called and states that she is having shortness of breath. States this has been going on since Sunday.

## 2015-08-09 NOTE — Telephone Encounter (Signed)
Pt states she can walk only 10 ft without becoming SOB.I offered her a 1:50 apt with NP,tastes she will attempt to get a ride

## 2015-08-11 ENCOUNTER — Ambulatory Visit (INDEPENDENT_AMBULATORY_CARE_PROVIDER_SITE_OTHER): Payer: Medicare Other | Admitting: Adult Health

## 2015-08-11 ENCOUNTER — Encounter: Payer: Self-pay | Admitting: Adult Health

## 2015-08-11 ENCOUNTER — Ambulatory Visit (HOSPITAL_COMMUNITY)
Admission: RE | Admit: 2015-08-11 | Discharge: 2015-08-11 | Disposition: A | Discharge status: Home / Self Care | Payer: Medicare Other | Source: Ambulatory Visit | Attending: Adult Health | Admitting: Adult Health

## 2015-08-11 VITALS — BP 124/48 | HR 92 | Ht 62.0 in | Wt 218.0 lb

## 2015-08-11 DIAGNOSIS — R0602 Shortness of breath: Secondary | ICD-10-CM | POA: Insufficient documentation

## 2015-08-11 DIAGNOSIS — I509 Heart failure, unspecified: Secondary | ICD-10-CM | POA: Insufficient documentation

## 2015-08-11 DIAGNOSIS — M4856XA Collapsed vertebra, not elsewhere classified, lumbar region, initial encounter for fracture: Secondary | ICD-10-CM | POA: Diagnosis not present

## 2015-08-11 DIAGNOSIS — Z952 Presence of prosthetic heart valve: Secondary | ICD-10-CM | POA: Diagnosis not present

## 2015-08-11 NOTE — Progress Notes (Deleted)
Name: Sandra Turner    DOB: 05/26/1941  Age: 74 y.o.  MR#: [id number]       PCP:  [name], MD      Insurance: Payor: [employer] MEDICARE / Plan: [facility] MEDICARE / Product Type: *No Product type* /   CC:   No chief complaint on file.   VS Filed Vitals:   08/11/15 1439  BP: 124/48  Pulse: 92  Height: 5\' 2"  (1.575 m)  Weight: 218 lb (98.884 kg)  SpO2: 95%    Weights Current Weight  08/11/15 218 lb (98.884 kg)  07/25/15 213 lb (96.616 kg)  07/20/15 213 lb 9.6 oz (96.888 kg)    Blood Pressure  BP Readings from Last 3 Encounters:  08/11/15 124/48  07/25/15 117/56  07/20/15 126/62     Admit date:  (Not on file) Last encounter with RMR:  08/09/2015   Allergy Bee venom; Latex; and Penicillins  Current Outpatient Prescriptions  Medication Sig Dispense Refill  . albuterol (PROAIR HFA) 108 (90 BASE) MCG/ACT inhaler Inhale 2 puffs into the lungs 4 (four) times daily as needed for wheezing or shortness of breath.    Marland Kitchen albuterol (PROVENTIL) (2.5 MG/3ML) 0.083% nebulizer solution Take 3 mLs (2.5 mg total) by nebulization every 4 (four) hours as needed for wheezing or shortness of breath. 75 mL 12  . budesonide (PULMICORT) 0.25 MG/2ML nebulizer solution Take 2 mLs (0.25 mg total) by nebulization 2 (two) times daily. 60 mL 11  . CARTIA XT 180 MG 24 hr capsule Take 180 mg by mouth daily.     . dabigatran (PRADAXA) 150 MG CAPS capsule Take 1 capsule (150 mg total) by mouth 2 (two) times daily. 60 capsule 6  . docusate sodium (COLACE) 100 MG capsule Take 1 capsule (100 mg total) by mouth daily as needed for mild constipation. 30 capsule 1  . FLOVENT DISKUS 250 MCG/BLIST AEPB INHALE ONE PUFF BY MOUTH TWICE DAILY 60 each 5  . magnesium oxide (MAG-OX) 400 MG tablet Take 400 mg by mouth 2 (two) times daily.    . potassium chloride (K-DUR) 10 MEQ tablet Take 2 tablets (20 mEq total) by mouth daily. 60 tablet 6  . torsemide (DEMADEX) 20 MG tablet For the next 2 days, 06/30/15 and 07/01/15   take torsemide 60 mg am and 40 mg pm, THEN take 40 mg twice a day 130 tablet 3  . traMADol (ULTRAM) 50 MG tablet Take 1 tablet (50 mg total) by mouth every 6 (six) hours as needed (pain). 50 tablet 0   No current facility-administered medications for this visit.    Discontinued Meds:   There are no discontinued medications.  Patient Active Problem List   Diagnosis Date Noted  . Hypokalemia 05/26/2015  . Chronic anemia 05/26/2015  . Lobar pneumonia (HCC) 05/26/2015  . Acute on chronic respiratory failure with hypoxemia (HCC)   . Respiratory distress   . Right-sided heart failure (HCC) 05/24/2015  . Edema   . Tongue lesion 05/23/2015  . Chronic respiratory failure with hypoxia (HCC) 05/23/2015  . AKI (acute kidney injury) (HCC) 05/23/2015  . CKD (chronic kidney disease), stage III 05/23/2015  . Congestive heart disease (HCC)   . Acute on chronic diastolic heart failure (HCC) 03/14/2015  . Anemia 03/12/2015  . ARF (acute renal failure) (HCC) 03/12/2015  . Dyspnea 03/12/2015  . 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 Pressure ulcer 02/01/2015  . Asthmatic bronchitis with acute exacerbation 01/31/2015  . Mucus plugging of bronchi 01/31/2015  . Atelectasis   .  Shortness of breath   . Pleural effusion   . 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 S/P Maze operation for atrial fibrillation 01/19/2015  . Acute respiratory failure with hypoxia (HCC) 09/11/2014  . Chronic bronchitis with acute exacerbation (HCC) 09/11/2014  . Family history of malignant hyperthermia 04/26/2014  . Pulmonary hypertension (HCC) 08/25/2012  . Atrial fibrillation (HCC) 08/22/2012  . Obesity 08/22/2012    LABS    Component Value Date/Time   NA 136 08/04/2015 1102   NA 135 07/20/2015 1240   NA 137 07/06/2015 0913   K 4.0 08/04/2015 1102   K 4.2 07/20/2015 1240   K 3.9 07/06/2015 0913   CL 93* 08/04/2015 1102   CL 94* 07/20/2015 1240   CL 92* 07/06/2015  0913   CO2 32* 08/04/2015 1102   CO2 33* 07/20/2015 1240   CO2 37* 07/06/2015 0913   GLUCOSE 121* 08/04/2015 1102   GLUCOSE 140* 07/20/2015 1240   GLUCOSE 114* 07/06/2015 0913   BUN 36* 08/04/2015 1102   BUN 32* 07/20/2015 1240   BUN 30* 07/06/2015 0913   CREATININE 2.15* 08/04/2015 1102   CREATININE 2.09* 07/20/2015 1240   CREATININE 1.85* 07/06/2015 0913   CREATININE 1.55* 05/30/2015 0602   CREATININE 1.58* 05/06/2015 1730   CREATININE 1.84* 04/29/2015 1444   CALCIUM 9.0 08/04/2015 1102   CALCIUM 8.9 07/20/2015 1240   CALCIUM 8.9 07/06/2015 0913   GFRNONAA 22* 07/20/2015 1240   GFRNONAA 26* 07/06/2015 0913   GFRNONAA 32* 05/30/2015 0602   GFRAA 26* 07/20/2015 1240   GFRAA 30* 07/06/2015 0913   GFRAA 37* 05/30/2015 0602   CMP     Component Value Date/Time   NA 136 08/04/2015 1102   K 4.0 08/04/2015 1102   CL 93* 08/04/2015 1102   CO2 32* 08/04/2015 1102   GLUCOSE 121* 08/04/2015 1102   BUN 36* 08/04/2015 1102   CREATININE 2.15* 08/04/2015 1102   CREATININE 2.09* 07/20/2015 1240   CALCIUM 9.0 08/04/2015 1102   PROT 7.6 05/23/2015 0708   ALBUMIN 3.6 05/23/2015 0708   AST 27 05/23/2015 0708   ALT 17 05/23/2015 0708   ALKPHOS 93 05/23/2015 0708   BILITOT 0.7 05/23/2015 0708   GFRNONAA 22* 07/20/2015 1240   GFRAA 26* 07/20/2015 1240       Component Value Date/Time   WBC 13.7* 05/30/2015 0602   WBC 12.6* 05/29/2015 0531   WBC 11.7* 05/28/2015 0448   HGB 8.1* 05/30/2015 0602   HGB 7.9* 05/29/2015 0531   HGB 8.0* 05/28/2015 0448   HCT 25.2* 05/30/2015 0602   HCT 24.8* 05/29/2015 0531   HCT 25.8* 05/28/2015 0448   MCV 87.8 05/30/2015 0602   MCV 89.5 05/29/2015 0531   MCV 89.3 05/28/2015 0448    Lipid Panel  No results found for: CHOL, TRIG, HDL, CHOLHDL, VLDL, LDLCALC, LDLDIRECT  ABG    Component Value Date/Time   PHART 7.495* 05/26/2015 0450   PCO2ART 46.2* 05/26/2015 0450   PO2ART 66.1* 05/26/2015 0450   HCO3 34.8* 05/26/2015 0450   TCO2 10.0  05/26/2015 0450   ACIDBASEDEF TEST WILL BE CREDITED 01/20/2015 0930   O2SAT 94.7 05/25/2015 1255     Lab Results  Component Value Date   TSH 4.069 03/13/2015   BNP (last 3 results)  Recent Labs  02/07/15 0520 02/12/15 0435 03/12/15 2156  BNP 204.8* 271.9* 178.0*    ProBNP (last 3 results) No results for input(s): PROBNP in the last 8760 hours.  Cardiac Panel (last 3 results) No results for input(s): CKTOTAL, CKMB, TROPONINI, RELINDX in the last 72 hours.  Iron/TIBC/Ferritin/ %Sat    Component Value Date/Time   IRON 34 03/13/2015 0615   TIBC 279 03/13/2015 0615   FERRITIN 195 03/13/2015 0615   IRONPCTSAT 12 03/13/2015 0615     EKG Orders placed or performed during the hospital encounter of 05/22/15  . EKG 12-Lead  . EKG 12-Lead  . EKG     Prior Assessment and Plan Problem List as of 08/11/2015      Cardiovascular and Mediastinum   Atrial fibrillation [facility])   Last Assessment & Plan 07/20/2015 Office Visit Edited 07/20/2015  1:33 PM by Dyann Kief, PA-C    Patient's heart rate is well controlled back on Cartia XT 180 mg daily, also on Pradaxa      Pulmonary hypertension [facility])   Last Assessment & Plan 07/06/2015 Office Visit Written 07/06/2015  1:49 PM by Dyann Kief, PA-C    Good blood pressure control.      Acute on chronic diastolic heart failure [facility])   Last Assessment & Plan 07/20/2015 Office Visit Written 07/20/2015  1:32 PM by Dyann Kief, PA-C    Patient's weight is up 1 pound since I last saw her in 07/06/15. She continues to have edema. She had blood work drawn today but I don't have those results back yet. Increase Demadex to 60 mg in the morning and 40 in the evening for 2 days then back to 40 mg twice a day. If her renal function is worse we may need to change this dosing. She already has follow-up with [name] in January. I discussed once again the importance of a 2 g sodium diet especially going into the holidays. She is to call if she  has weight gain of 2 or 3 pounds overnight.      Congestive heart disease (HCC)   Right-sided heart failure (HCC)     Respiratory   Acute respiratory failure with hypoxia [facility])   Last Assessment & Plan 04/07/2015 Office Visit Written 04/07/2015  2:47 PM by Lupita Leash, MD    She is still hypoxemic to some degree. I believe this is multifactorial from atelectasis as well as some pulmonary edema.  Plan: Continue management per cardiology Continue using 2 L of oxygen continuously for now      Chronic bronchitis with acute exacerbation (HCC)   Atelectasis   Pleural effusion   Asthmatic bronchitis with acute exacerbation   Last Assessment & Plan 04/07/2015 Office Visit Edited 04/07/2015  2:49 PM by Lupita Leash, MD    During her hospitalization after her surgery she had severe respiratory failure requiring multiple intubations and mechanical ventilation. She produces thick copious mucus requiring frequent bronchoscopies for airway clearance. A CT scan at that time showed findings which were worrisome for possible mold exposure. The BAL culture did not grow a fungal organism.  Her wheezing has improved but she continues to have some mucus production. I believe that she still has asthmatic bronchitis which has not been treated because her prednisone was stopped during her recent hospitalization and she's not been taking an inhaled corticosteroid.  Plan: Check a full pulmonary function test Restart budesonide (she has not been taking this) Use albuterol as needed Consider Repeat CT chest      Mucus plugging of bronchi   Tracheomalacia   Chronic respiratory failure with hypoxia (HCC)   Acute on chronic respiratory failure with hypoxemia (  HCC)   Lobar pneumonia (HCC)     Musculoskeletal and Integument   Pressure ulcer     Genitourinary   ARF (acute renal failure) (HCC)   AKI (acute kidney injury) (HCC)   CKD (chronic kidney disease), stage III   Last Assessment & Plan 07/20/2015  Office Visit Written 07/20/2015  1:35 PM by Dyann KiefMichele M Lenze, PA-C    Renal function checked today. Last creatinine was 1.85. Before that was in the 1.5 range. We'll adjust diuretics as needed based on renal function.        Other   Obesity   Family history of malignant hyperthermia   Last Assessment & Plan 04/26/2014 Office Visit Written 04/26/2014  2:42 PM by Jonelle SidleSamuel G McDowell, MD    I continue to recommend that she get screened and have provided her the information to make contact with a center that performs this testing. Frankly, I am not certain whether our anesthesia group can help with this or not. Clearly, this will need to be investigated before she is considered for any type of open aortic valve repair.      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

## 2015-08-11 NOTE — Progress Notes (Signed)
Cardiology Office Note   Date:  08/11/2015   ID:  Sandra Turner, DOB Feb 08, 1941, MRN [id number]  PCP:  Ernestine ConradBLUTH, [name], MD  Cardiologist:   Joni ReiningKathryn Lawrence, NP   Chief Complaint  Patient presents with  . Shortness of Breath      History of Present Illness: Sandra Turner is a 74 y.o. female who presents for ongoing assessment and management of atrial fib, AoV replacement with bioprosthetic tissue valve with aortic root enlargement and Maze procedure on 01/19/2015. echocardiogram performed 03/16/2015 revealed normal functioning bioprosthetic tissue valve in the aortic position with low transvalvular gradient (Peak velocity 2.7 m/s corresponding to mean transvalvular gradient estimated 13 mmHg) and normal left ventricular systolic function with ejection fraction estimated 55-60%. Echocardiogram also demonstrated the presence of moderate right ventricular chamber enlargement with moderate to severe right ventricular dysfunction and pulmonary artery pressures estimated at 44 mmHg.  She comes today for a focused appointment with 3-4 days of increasing DOE with history of DOE and symptoms of chronic exertional shortness of breath. She gets short of breath with very mild activity. She has chronic asthmatic bronchitis, NYHA Class llB. O2 dependent.  She states her breathing is worsening with chronic coughing. States yellow to white expectorant. She has put on 3-4 lbs but has not been active due to her breathing. She is in wheelchair today. She has not had any significant edema, but has chronic dependent edema.    Past Medical History  Diagnosis Date  . Arthritis   . Chronic atrial fibrillation (HCC)     a. previously on Pradaxa - on Coumadin since valve surgery.  . Aortic stenosis     a. Severe - s/p pericardial AVR with MAZE 02/04/15.  Marland Kitchen. Pneumonia 09/2014  . History of bronchitis 2015  . Family history of adverse reaction to anesthesia     Uncle with MH in the 60's,cousin in the 1470's with MH  .  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 COPD (chronic obstructive pulmonary disease) (HCC)   . Normal coronary arteries     a. By cath 11/2014.  . Pulmonary hypertension (HCC)     a. Mod by cath 11/2014.  Marland Kitchen. Chronic diastolic CHF (congestive heart failure) (HCC)   . Vocal cord paralysis     a. Dx post-AVR in 01/2015. Required PANDA, intubation during that admission.  . Pleural effusion   . AKI (acute kidney injury) (HCC)     a. H/o AKI after AVR in 01/2015.  . Tracheomalacia   . Anemia     a. 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    Past Surgical History  Procedure Laterality Date  . None    . Left and right heart catheterization with coronary angiogram N/A 11/01/2014    Procedure: LEFT AND RIGHT HEART CATHETERIZATION WITH CORONARY ANGIOGRAM;  Surgeon: Kathleene Hazelhristopher D McAlhany, MD;  [facility];  Service: Cardiovascular;  Laterality: N/A;  . Aortic valve replacement N/A 01/19/2015    Procedure: AORTIC VALVE REPLACEMENT (AVR);  Surgeon: Purcell Nailslarence H Owen, MD;  [facility];  Service: Open Heart Surgery;  Laterality: N/A;  . Maze N/A 01/19/2015    Procedure: MAZE;  Surgeon: Purcell Nailslarence H Owen,  MD;  Location: [facility] OR;  Service: Open Heart Surgery;  Laterality: N/A;  . Aortic root enlargement N/A 01/19/2015    Procedure:  AORTIC ROOT ENLARGEMENT;  Surgeon: Purcell Nails, MD;  Location[facility];  Service: Open Heart Surgery;  Laterality: N/A;  . Tee without cardioversion N/A 01/19/2015    Procedure: TRANSESOPHAGEAL ECHOCARDIOGRAM (TEE);  Surgeon: Purcell Nails, MD;  Location: [facility] OR;  Service: Open Heart Surgery;  Laterality: N/A;     Current Outpatient  Prescriptions  Medication Sig Dispense Refill  . albuterol (PROAIR HFA) 108 (90 BASE) MCG/ACT inhaler Inhale 2 puffs into the lungs 4 (four) times daily as needed for wheezing or shortness of breath.    Marland Kitchen albuterol (PROVENTIL) (2.5 MG/3ML) 0.083% nebulizer solution Take 3 mLs (2.5 mg total) by nebulization every 4 (four) hours as needed for wheezing or shortness of breath. 75 mL 12  . budesonide (PULMICORT) 0.25 MG/2ML nebulizer solution Take 2 mLs (0.25 mg total) by nebulization 2 (two) times daily. 60 mL 11  . CARTIA XT 180 MG 24 hr capsule Take 180 mg by mouth daily.     . dabigatran (PRADAXA) 150 MG CAPS capsule Take 1 capsule (150 mg total) by mouth 2 (two) times daily. 60 capsule 6  . docusate sodium (COLACE) 100 MG capsule Take 1 capsule (100 mg total) by mouth daily as needed for mild constipation. 30 capsule 1  . FLOVENT DISKUS 250 MCG/BLIST AEPB INHALE ONE PUFF BY MOUTH TWICE DAILY 60 each 5  . magnesium oxide (MAG-OX) 400 MG tablet Take 400 mg by mouth 2 (two) times daily.    . potassium chloride (K-DUR) 10 MEQ tablet Take 2 tablets (20 mEq total) by mouth daily. 60 tablet 6  . torsemide (DEMADEX) 20 MG tablet For the next 2 days, 06/30/15 and 07/01/15  take torsemide 60 mg am and 40 mg pm, THEN take 40 mg twice a day 130 tablet 3  . traMADol (ULTRAM) 50 MG tablet Take 1 tablet (50 mg total) by mouth every 6 (six) hours as needed (pain). 50 tablet 0   No current facility-administered medications for this visit.    Allergies:   Bee venom; Latex; and Penicillins    Social History:  The patient  reports that she has never smoked. She has never used smokeless tobacco. She reports that she does not drink alcohol or use illicit drugs.   Family History:  The patient's family history includes Breast cancer in her mother; Cancer in her father; Congestive Heart Failure in her mother; Malignant hyperthermia in her cousin.    ROS: All other systems are reviewed and negative. Unless otherwise  mentioned in H&P    PHYSICAL EXAM: VS:  BP 124/48 mmHg  Pulse 92  Ht  (1.575 m)  Wt 218 lb (98.884 kg)  BMI 39.86 kg/m2  SpO2 95% , BMI Body mass index is 39.86 kg/(m^2). GEN: Well nourished, well developed, in no acute distress HEENT: normal Neck: no JVD, carotid bruits, or masses Cardiac: RRR; no murmurs, rubs, or gallops,no edema  Respiratory: Crackles in the bases. No wheezing. Wearing O2.  MS: no deformity or atrophy1+-2+ pitting edema in the dependent position.  Skin: warm and dry, no rash Neuro:  Strength and sensation are intact Psych: euthymic mood, full affect   Recent Labs: 03/12/2015: B Natriuretic Peptide 178.0* 03/13/2015: TSH 4.069 05/23/2015: ALT 17 05/27/2015: Magnesium 2.0 05/30/2015: Hemoglobin 8.1*; Platelets 227 08/04/2015: BUN 36*; Creat 2.15*; Potassium 4.0; Sodium 136  Lipid Panel No results found for: CHOL, TRIG, HDL, CHOLHDL, VLDL, LDLCALC, LDLDIRECT    Wt Readings from Last 3 Encounters:  08/11/15 218 lb (98.884 kg)  07/25/15 213 lb (96.616 kg)  07/20/15 213 lb 9.6 oz (96.888 kg)     ASSESSMENT AND PLAN:  1.  Chronic Dyspnea: Worsening with coughing and congestion with COPD which is O2 dependent. I will have a CXR completed. I do not think she is in CHF presently despite her mild LEE. This is chronic for her. I have advised her to use her Neb tx QID as directed instead of BID as she is doing. She is to follow up with Dr. Kendrick Fries for ongoing treatment of lung disease. I will not provide more lasix unless I find evidence of CHF on CXR.   2. CAD: No complaints of chest pain. Dyspnea does not appear to cardiac related.   3. Hypertension: Currently controlled. No changes.   Current medicines are reviewed at length with the patient today.    Labs/ tests ordered today include: CXR  Orders Placed This Encounter  Procedures  . DG Chest 2 View     Disposition:   FU with with [name] 3 months.  Signed, Joni Reining, NP  08/11/2015  4:07 PM    [facility] [address] [place], [place] [address] Phone: [phone]; Fax: [phone]

## 2015-08-11 NOTE — Patient Instructions (Addendum)
Your physician recommends that you schedule a follow-up appointment with Dr. Diona BrownerMcDowell on 09/06/15  Please have chest x-ray done today  Your physician recommends that you continue on your current medications as directed. Please refer to the Current Medication list given to you today.  If you need a refill on your cardiac medications before your next appointment, please call your pharmacy.  Take Albuterol nebulizer Every 4 hours as needed for shortness of breath.   Please schedule appointment with Dr. Kendrick FriesMcQuaid  Thank you for choosing [facility]!

## 2015-08-12 ENCOUNTER — Telehealth: Payer: Self-pay

## 2015-08-12 ENCOUNTER — Telehealth: Payer: Self-pay | Admitting: Adult Health

## 2015-08-12 ENCOUNTER — Telehealth: Payer: Self-pay | Admitting: *Deleted

## 2015-08-12 DIAGNOSIS — Z79899 Other long term (current) drug therapy: Secondary | ICD-10-CM

## 2015-08-12 MED ORDER — TORSEMIDE 20 MG PO TABS: ORAL_TABLET | ORAL | Status: DC

## 2015-08-12 MED ORDER — POTASSIUM CHLORIDE ER 20 MEQ PO TBCR: 40.0000 meq | EXTENDED_RELEASE_TABLET | Freq: Two times a day (BID) | ORAL | Status: DC

## 2015-08-12 MED ORDER — METOLAZONE 2.5 MG PO TABS: 2.5000 mg | ORAL_TABLET | Freq: Every day | ORAL | Status: DC

## 2015-08-12 NOTE — Telephone Encounter (Signed)
Prior auth for Potassim CL sent.

## 2015-08-12 NOTE — Telephone Encounter (Signed)
-----   Message from Jodelle GrossKathryn M Lawrence, NP sent at 08/12/2015  7:05 AM EST ----- CXR positive for heart failure. Please send her Rx fo metolazone 2.5 mg #5,  to take one today only. She is to go up on her torsemide to 60 mg BID with increase in potassium to 40 mEq BID. Repeat BMET on Monday. If no drop in wt or improvement in symptoms by Sunday, she should be seen in ER for possible admission. If she is doing well, have follow up arranged with me or Dr. Wyline MoodBranch in a couple of weeks.

## 2015-08-12 NOTE — Telephone Encounter (Signed)
Pt is needs to know how much of her Metolazone she's supposed to take

## 2015-08-12 NOTE — Telephone Encounter (Signed)
Prior auth for Pot[facility] Chlor[facility]q [facility]

## 2015-08-12 NOTE — Telephone Encounter (Signed)
Spoke with Pt. Instructed to take Metolazone 2.5 mg Today only.

## 2015-08-15 ENCOUNTER — Telehealth: Payer: Self-pay

## 2015-08-15 NOTE — Telephone Encounter (Signed)
Potassium Chloride 20 meq approved throigh 09/02/2016. PA# [id number].

## 2015-08-16 ENCOUNTER — Telehealth: Payer: Self-pay | Admitting: *Deleted

## 2015-08-16 ENCOUNTER — Other Ambulatory Visit (HOSPITAL_COMMUNITY)
Admission: RE | Admit: 2015-08-16 | Discharge: 2015-08-16 | Disposition: A | Discharge status: Home / Self Care | Payer: Medicare Other | Source: Ambulatory Visit | Attending: Adult Health | Admitting: Adult Health

## 2015-08-16 DIAGNOSIS — Z79899 Other long term (current) drug therapy: Secondary | ICD-10-CM | POA: Insufficient documentation

## 2015-08-16 LAB — BASIC METABOLIC PANEL
ANION GAP: 9 (ref 5–15)
BUN: 58 mg/dL — ABNORMAL HIGH (ref 6–20)
CALCIUM: 9 mg/dL (ref 8.9–10.3)
CO2: 35 mmol/L — AB (ref 22–32)
Chloride: 91 mmol/L — ABNORMAL LOW (ref 101–111)
Creatinine, Ser: 2.34 mg/dL — ABNORMAL HIGH (ref 0.44–1.00)
GFR, EST AFRICAN AMERICAN: 23 mL/min — AB (ref >60)
GFR, EST NON AFRICAN AMERICAN: 20 mL/min — AB (ref >60)
Glucose, Bld: 114 mg/dL — ABNORMAL HIGH (ref 65–99)
Potassium: 3.4 mmol/L — ABNORMAL LOW (ref 3.5–5.1)
SODIUM: 135 mmol/L (ref 135–145)

## 2015-08-16 NOTE — Telephone Encounter (Signed)
-----   Message from Jodelle GrossKathryn M Lawrence, NP sent at 08/16/2015  4:50 PM EST ----- Potassium a little low today. She is to take additional 40 mEq today only,  on top of the 40 mEq she is supposed to be taking BID. She is to be reminded to only take the metolazone for weight gain. Only was supposed to take it once. Hopefully she has lost some fluid weight.

## 2015-08-16 NOTE — Telephone Encounter (Signed)
Patent notified of lab results and the need to take additional 40 mEq of Potassium. Patient voiced understanding. Patient reports feeling better this week and a weight of 9 lbs. Pt states she did only take the one metolazone. Current weight is 209 lbs. Will forward to Joni ReiningKathryn Lawrence, NP.

## 2015-08-17 IMAGING — DX DG CHEST 2V
2 series · 2 of 2 positions shown · non-contrast
Comparison: 03/12/2015

CLINICAL DATA: Shortness of breath and cough for 3 weeks. Pleural
effusions. Atrial fibrillation. COPD.

EXAM:
CHEST  2 VIEW

[chest lat]
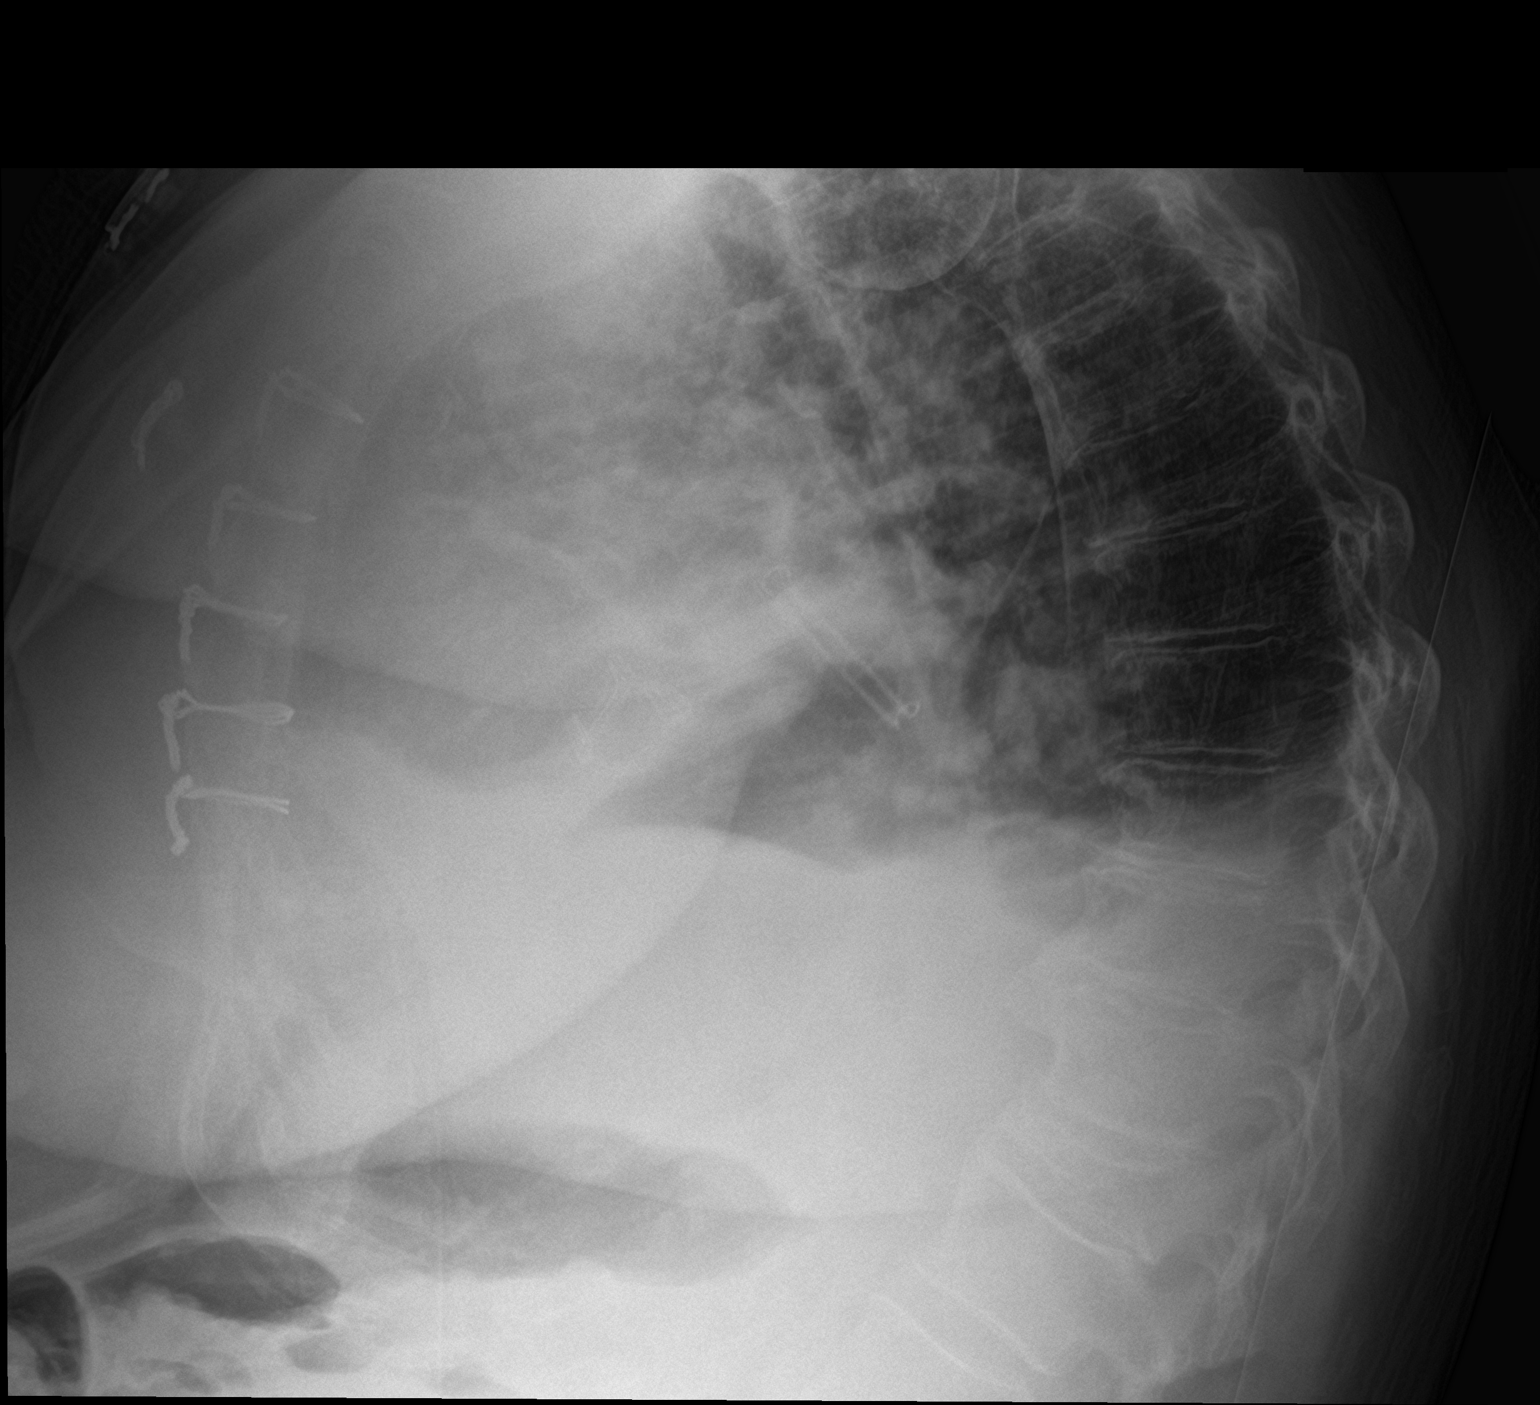

[chest ap]
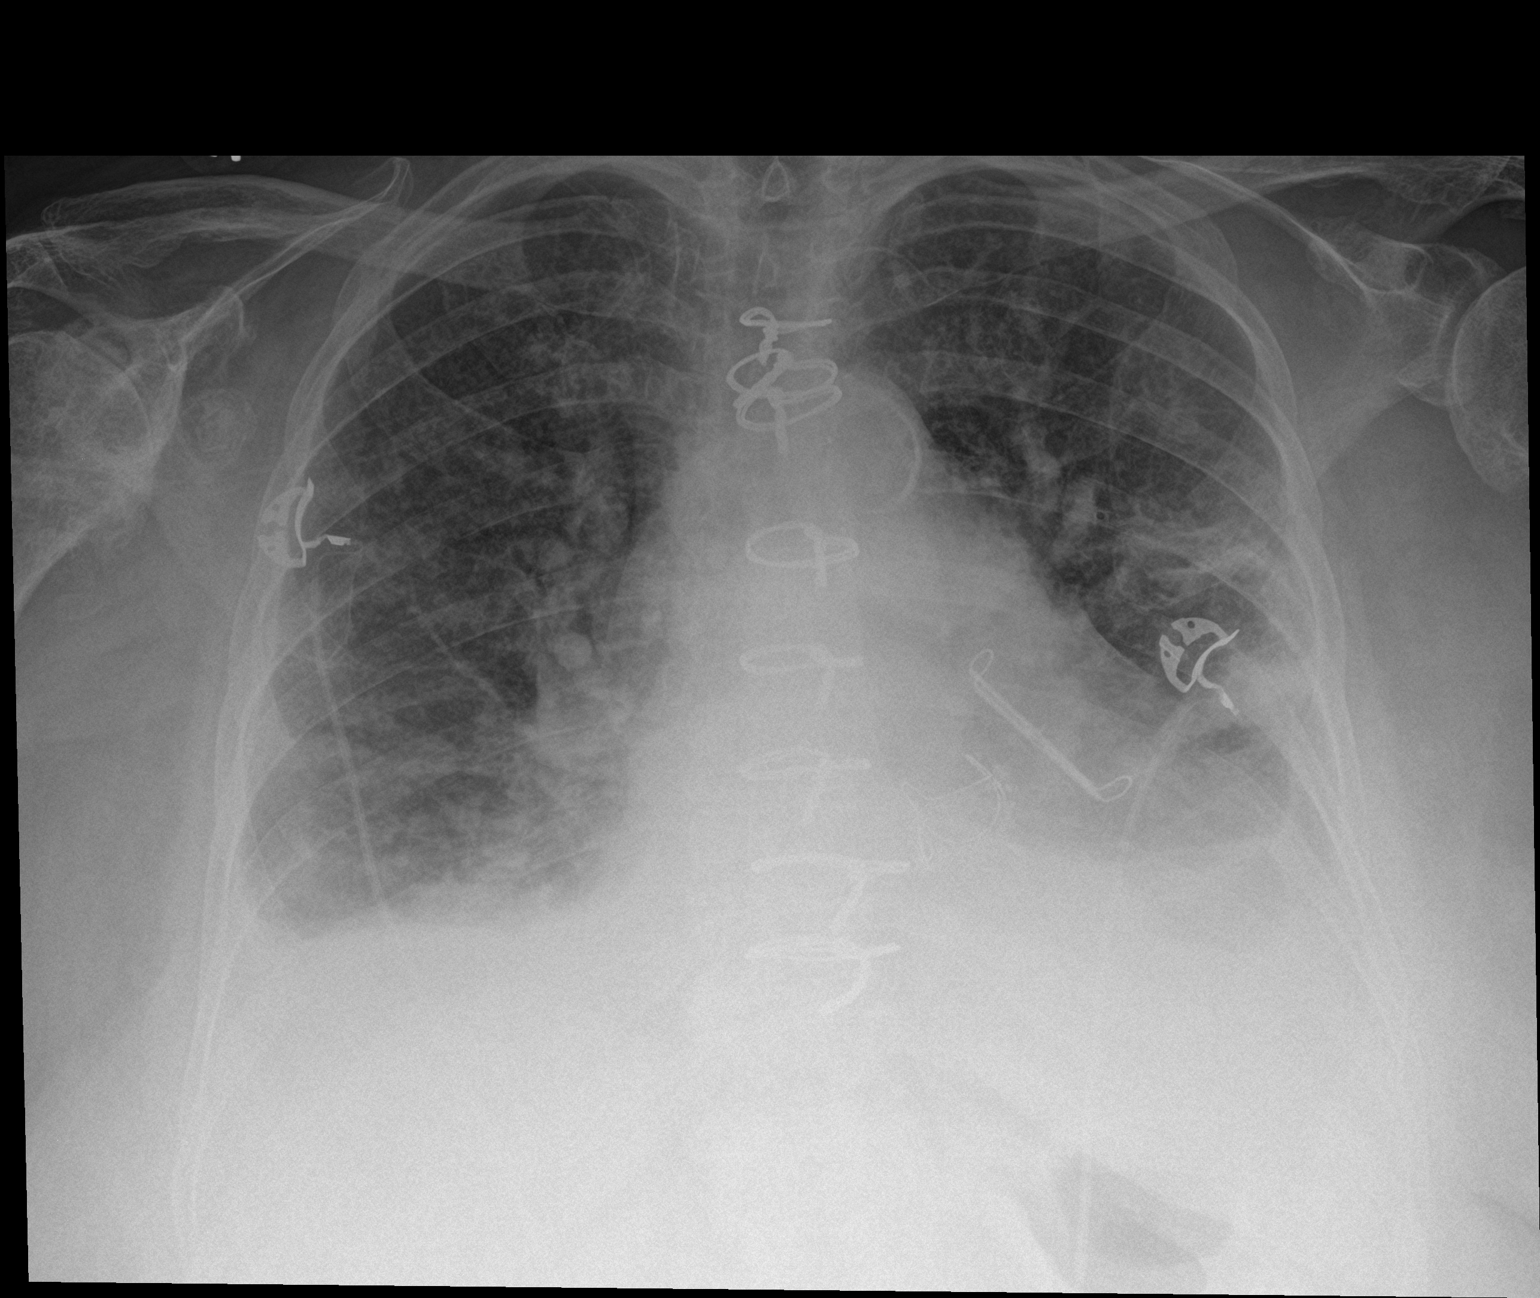

[2 of 2 positions shown; findings below may reference images not displayed]

FINDINGS: Patient has undergone previous aortic valve replacement. Mild
cardiomegaly stable. Diffuse pulmonary vascular congestion and
interstitial prominence is suspicious for mild interstitial edema.
Infiltrate or atelectasis is seen in the lingula, without
significant change. Small bilateral pleural effusions appears
stable.
IMPRESSION: Mild congestive heart failure and small bilateral pleural effusions,
without significant change.

Stable lingular infiltrate versus atelectasis.

## 2015-08-19 NOTE — Telephone Encounter (Signed)
Thank you :)

## 2015-08-30 ENCOUNTER — Telehealth: Payer: Self-pay | Admitting: Adult Health

## 2015-08-30 ENCOUNTER — Ambulatory Visit: Payer: Medicare Other | Admitting: Adult Health

## 2015-08-30 NOTE — Telephone Encounter (Signed)
Called pt and spoke to her about her potassium prescription. She had only been taking 40 meq daily total. I explained to her that she needs to be taking 40 meq two times daily (80 meq total daily) She said she thought that was too much to be taking, but will start taking it. She also wanted to know if she could start taking an 81 mg aspirin daily. I told her I would ask the doctor and get back to her.

## 2015-08-30 NOTE — Telephone Encounter (Signed)
She does not need to take ASA. Just the Pradaxa.

## 2015-08-30 NOTE — Telephone Encounter (Signed)
Patient states she would like to speak with nurse regarding her Potassium / tg

## 2015-08-30 NOTE — Telephone Encounter (Signed)
Let pt know she does not need to take any ASA. She voiced understanding.

## 2015-09-06 ENCOUNTER — Encounter: Payer: Self-pay | Admitting: Cardiology

## 2015-09-06 ENCOUNTER — Ambulatory Visit (INDEPENDENT_AMBULATORY_CARE_PROVIDER_SITE_OTHER): Payer: Medicare Other | Admitting: Cardiology

## 2015-09-06 VITALS — BP 116/62 | HR 80 | Ht 62.0 in | Wt 216.6 lb

## 2015-09-06 DIAGNOSIS — I482 Chronic atrial fibrillation, unspecified: Secondary | ICD-10-CM

## 2015-09-06 DIAGNOSIS — I5032 Chronic diastolic (congestive) heart failure: Secondary | ICD-10-CM

## 2015-09-06 DIAGNOSIS — Z79899 Other long term (current) drug therapy: Secondary | ICD-10-CM

## 2015-09-06 DIAGNOSIS — R0602 Shortness of breath: Secondary | ICD-10-CM | POA: Diagnosis not present

## 2015-09-06 DIAGNOSIS — N183 Chronic kidney disease, stage 3 unspecified: Secondary | ICD-10-CM

## 2015-09-06 DIAGNOSIS — Z954 Presence of other heart-valve replacement: Secondary | ICD-10-CM

## 2015-09-06 DIAGNOSIS — Z952 Presence of prosthetic heart valve: Secondary | ICD-10-CM

## 2015-09-06 NOTE — Progress Notes (Signed)
Cardiology Office Note  Date: 09/06/2015   ID: Sandra Turner, DOB 08-29-1941, MRN [id number]  PCP: Ernestine ConradBLUTH, [name], MD  Primary Cardiologist: Nona DellSamuel Cheyne Bungert, MD   Chief Complaint  Patient presents with  . Status post AVR  . Diastolic heart failure    History of Present Illness: Sandra Turner is a 75 y.o. female that I last saw in October. She has had follow-up visits with Dr. Cornelius Moraswen, and most recently Ms. Lawrence NP - I reviewed the notes. She has had intensification in her diuretic regimen including increase of Demadex to 60 mg twice daily and the addition of as needed low-dose metolazone. Most recent follow-up lab work is outlined below. Her creatinine has climbed from 1.8 back in November up to 2.3 most recently. Weight is down 2 pounds from her last encounter.  Despite these changes, she has not noticed any substantial difference in her chronic shortness of breath. She reports chest congestion and intermittently productive cough. She uses oxygen continuously, also very sedentary. She is here with a friend today who told me in private that she really does not get up and move around at all.  She has not had a follow-up visit with the Pulmonary division recently. Continues on inhalers and nebulizer treatments at home.  She has been in persistent atrial fibrillation, was taken off of amiodarone previously. We have talked about the possibility of an elective cardioversion, although this would carry increased risk with her pulmonary disease and oxygen requirement, and frankly am not at all certain that she would maintain sinus rhythm for any significant duration in light of her comorbidities.  She continues on Pradaxa, does not report any bleeding problems. Also Cartia XT for heart rate control.  Past Medical History  Diagnosis Date  . Arthritis   . Chronic atrial fibrillation (HCC)     a. previously on Pradaxa - on Coumadin since valve surgery.  . Aortic stenosis     a. Severe - s/p  pericardial AVR with MAZE 02/04/15.  Marland Kitchen. Pneumonia 09/2014  . History of bronchitis 2015  . Family history of adverse reaction to anesthesia     Uncle with MH in the 60's,cousin in the 7970's with MH  . 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 COPD (chronic obstructive pulmonary disease) (HCC)   . Normal coronary arteries     a. By cath 11/2014.  . Pulmonary hypertension (HCC)     a. Mod by cath 11/2014.  Marland Kitchen. Chronic diastolic CHF (congestive heart failure) (HCC)   . Vocal cord paralysis     a. Dx post-AVR in 01/2015. Required PANDA, intubation during that admission.  . Pleural effusion   . AKI (acute kidney injury) (HCC)     a. H/o AKI after AVR in 01/2015.  . Tracheomalacia   . Anemia     a. 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    Past Surgical History  Procedure Laterality Date  . None    . Left and right heart catheterization with coronary angiogram N/A 11/01/2014    Procedure: LEFT AND RIGHT HEART CATHETERIZATION WITH CORONARY ANGIOGRAM;  Surgeon: Kathleene Hazelhristopher D McAlhany, MD;  [facility];  Service: Cardiovascular;  Laterality: N/A;  . Aortic valve replacement N/A 01/19/2015    Procedure: AORTIC VALVE REPLACEMENT (AVR);  Surgeon: Purcell Nails, MD;  Location: [facility] OR;  Service: Open Heart Surgery;  Laterality: N/A;  . Maze N/A 01/19/2015    Procedure: MAZE;  Surgeon: Purcell Nails, MD;  Location: [facility] OR;  Service: Open Heart Surgery;  Laterality: N/A;  . Aortic root enlargement N/A 01/19/2015    Procedure:  AORTIC ROOT ENLARGEMENT;  Surgeon: Purcell Nails, MD;  Location: [facility] OR;  Service: Open Heart Surgery;  Laterality: N/A;  . Tee without cardioversion  N/A 01/19/2015    Procedure: TRANSESOPHAGEAL ECHOCARDIOGRAM (TEE);  Surgeon: Purcell Nails, MD;  Location: [facility] OR;  Service: Open Heart Surgery;  Laterality: N/A;    Current Outpatient Prescriptions  Medication Sig Dispense Refill  . albuterol (PROAIR HFA) 108 (90 BASE) MCG/ACT inhaler Inhale 2 puffs into the lungs 4 (four) times daily as needed for wheezing or shortness of breath.    Marland Kitchen albuterol (PROVENTIL) (2.5 MG/3ML) 0.083% nebulizer solution Take 3 mLs (2.5 mg total) by nebulization every 4 (four) hours as needed for wheezing or shortness of breath. 75 mL 12  . budesonide (PULMICORT) 0.25 MG/2ML nebulizer solution Take 2 mLs (0.25 mg total) by nebulization 2 (two) times daily. 60 mL 11  . CARTIA XT 180 MG 24 hr capsule Take 180 mg by mouth daily.     . dabigatran (PRADAXA) 150 MG CAPS capsule Take 1 capsule (150 mg total) by mouth 2 (two) times daily. 60 capsule 6  . docusate sodium (COLACE) 100 MG capsule Take 1 capsule (100 mg total) by mouth daily as needed for mild constipation. 30 capsule 1  . FLOVENT DISKUS 250 MCG/BLIST AEPB INHALE ONE PUFF BY MOUTH TWICE DAILY 60 each 5  . magnesium oxide (MAG-OX) 400 MG tablet Take 400 mg by mouth 2 (two) times daily.    . metolazone (ZAROXOLYN) 2.5 MG tablet Take 1 tablet (2.5 mg total) by mouth daily. 5 tablet 0  . potassium chloride 20 MEQ TBCR Take 40 mEq by mouth 2 (two) times daily. 120 tablet 6  . torsemide (DEMADEX) 20 MG tablet Take 60 mg Two Times Daily 180 tablet 6  . traMADol (ULTRAM) 50 MG tablet Take 1 tablet (50 mg total) by mouth every 6 (six) hours as needed (pain). 50 tablet 0   No current facility-administered medications for this visit.   Allergies:  Bee venom; Latex; and Penicillins   Social History: The patient  reports that she has never smoked. She has never used smokeless tobacco. She reports that she does not drink alcohol or use illicit drugs.   Family History: The patient's family history includes Breast cancer in  her mother; Cancer in her father; Congestive Heart Failure in her mother; Malignant hyperthermia in her cousin.   ROS:  Please see the history of present illness. Otherwise, complete review of systems is positive for chronic fatigue and shortness of breath.  All other systems are reviewed and negative.   Physical Exam: VS:  BP 116/62 mmHg  Pulse 80  Ht 5\' 2"  (1.575 m)  Wt 216 lb 9.6 oz (98.249 kg)  BMI 39.61 kg/m2  SpO2 94%, BMI Body mass index is 39.61 kg/(m^2).  Wt Readings from Last 3 Encounters:  09/06/15 216 lb 9.6 oz (98.249 kg)  08/11/15 218 lb (98.884 kg)  07/25/15 213 lb (96.616 kg)    General: Obese woman seated in wheelchair, no distress. HEENT: Conjunctiva and lids  normal, oropharynx clear. Neck: Supple, elevated JVP present, no carotid bruits, no thyromegaly. Lungs: Decreased breath sounds without wheeze, nonlabored breathing at rest. Cardiac: Irregularly irregular, no S3, 2-3/6 systolic murmur, no pericardial rub. Abdomen: Protuberant, nontender, bowel sounds present. Extremities: Bilateral leg edema and stasis - better compared to last visit, distal pulses 1+. Skin: Warm and dry. Musculoskeletal: No kyphosis. Neuropsychiatric: Alert and oriented x3, affect grossly appropriate.  ECG: Rate-controlled atrial fibrillation with low voltage and possible old inferolateral infarct pattern.  Recent Labwork: 03/12/2015: B Natriuretic Peptide 178.0* 03/13/2015: TSH 4.069 05/23/2015: ALT 17; AST 27 05/27/2015: Magnesium 2.0 05/30/2015: Hemoglobin 8.1*; Platelets 227 08/16/2015: BUN 58*; Creatinine, Ser 2.34*; Potassium 3.4*; Sodium 135   Other Studies Reviewed Today:  Echocardiogram 03/16/2015: Study Conclusions  - Left ventricle: The cavity size was normal. Wall thickness was increased in a pattern of mild LVH. Systolic function was normal. The estimated ejection fraction was in the range of 55% to 60%. - Mitral valve: Mildly calcified annulus. Normal thickness  leaflets . There was mild regurgitation. - Left atrium: The atrium was severely dilated. - Right ventricle: The cavity size was moderately dilated. It shares the apex with the LV. Systolic function was moderately to severely reduced. RV TAPSE is 1.0 cm. Tricuspid anular systolic tissue velocity is 7 cm/s. - Right atrium: The atrium was severely dilated. - Tricuspid valve: There was mild-moderate regurgitation. - Pulmonary arteries: Systolic pressure was moderately increased. PA peak pressure: 44 mm Hg (S). - Pericardium, extracardiac: There is a large left pleural effusion. - Technically difficult study.  Assessment and Plan:  1. Chronic shortness of breath. This is likely multifactorial and contributed to by chronic diastolic heart failure, atrial fibrillation, deconditioning with limited activity, and COPD with moderate pulmonary hypertension. She is already on oxygen continuously with pulmonary inhalers. We are making sure that she has a follow-up scheduled with the Pulmonary division. Current diuretic regimen has resulted in weight loss, however with bump in creatinine, we will hold off further advancing her dosing. She knows to use metolazone only as needed, not every day. We did talk about the possibility of considering a cardioversion attempt for her atrial fibrillation, although this would carry increased risk of pulmonary decompensation, and I am not sure how much benefit she would feel and for that matter how long she would maintain sinus rhythm. She was not able to tolerate amiodarone previously.  2. Aortic stenosis status post pericardial AVR in March of this year by Dr. Cornelius Moras. Valve function was stable by echocardiogram in July.  3. Normal coronary arteries at cardiac catheterization in March 2016. She does not report any angina symptoms.  4. CKD stage 3, recent creatinine 2.2.  Current medicines were reviewed with the patient today.   Orders Placed This Encounter   Procedures  . Basic Metabolic Panel (BMET)    Disposition: FU with me in 6 weeks.   Signed, [name], MD, [facility] 09/06/2015 2:15 PM    [facility] at [facility] [address], [place], [place] [address] Phone: [phone]; Fax: [phone]

## 2015-09-06 NOTE — Patient Instructions (Signed)
Medication Instructions:  Your physician recommends that you continue on your current medications as directed. Please refer to the Current Medication list given to you today.   Labwork: Your physician recommends that you return for lab work in: 6 WEEKS JUST BEFORE NEXT VISIT BMET   Testing/Procedures: NONE  Follow-Up: Your physician recommends that you schedule a follow-up appointment in: 6 WEEKS WITH DR. MCDOWELL   Any Other Special Instructions Will Be Listed Below (If Applicable).     If you need a refill on your cardiac medications before your next appointment, please call your pharmacy.

## 2015-09-23 ENCOUNTER — Ambulatory Visit: Payer: Medicare Other | Admitting: Acute Care

## 2015-09-28 ENCOUNTER — Encounter: Payer: Self-pay | Admitting: Internal Medicine

## 2015-09-28 ENCOUNTER — Ambulatory Visit (INDEPENDENT_AMBULATORY_CARE_PROVIDER_SITE_OTHER): Payer: Medicare Other | Admitting: Acute Care

## 2015-09-28 ENCOUNTER — Telehealth: Payer: Self-pay | Admitting: Acute Care

## 2015-09-28 ENCOUNTER — Ambulatory Visit (INDEPENDENT_AMBULATORY_CARE_PROVIDER_SITE_OTHER)
Admission: RE | Admit: 2015-09-28 | Discharge: 2015-09-28 | Disposition: A | Discharge status: Home / Self Care | Payer: Medicare Other | Source: Ambulatory Visit | Attending: Acute Care | Admitting: Acute Care

## 2015-09-28 ENCOUNTER — Encounter: Payer: Self-pay | Admitting: Acute Care

## 2015-09-28 VITALS — BP 138/66 | HR 74 | Temp 98.9°F | Ht 62.0 in

## 2015-09-28 DIAGNOSIS — J90 Pleural effusion, not elsewhere classified: Secondary | ICD-10-CM | POA: Diagnosis not present

## 2015-09-28 DIAGNOSIS — J209 Acute bronchitis, unspecified: Secondary | ICD-10-CM | POA: Diagnosis not present

## 2015-09-28 DIAGNOSIS — J45901 Unspecified asthma with (acute) exacerbation: Secondary | ICD-10-CM

## 2015-09-28 DIAGNOSIS — J9601 Acute respiratory failure with hypoxia: Secondary | ICD-10-CM

## 2015-09-28 DIAGNOSIS — J42 Unspecified chronic bronchitis: Secondary | ICD-10-CM

## 2015-09-28 MED ORDER — DOXYCYCLINE HYCLATE 100 MG PO TABS: 100.0000 mg | ORAL_TABLET | Freq: Two times a day (BID) | ORAL | Status: DC

## 2015-09-28 MED ORDER — PREDNISONE 10 MG PO TABS: ORAL_TABLET | ORAL | Status: DC

## 2015-09-28 NOTE — Assessment & Plan Note (Signed)
Chest x ray today shows Chronic congestive heart failure unchanged compared prior exam. Bilateral pleural effusions with consolidation of bilateral lung bases unchanged. Pleural effusions first noted on CXR's 03/16/15. Echo 7/16 reflects EF of 55-60%, left atrium severely dilated, right ventricle dilated, right atrium severely dilated, PAP 44 mm Hg. Suspect etiology is cardiogenic in nature. Chronic kidney disease stage III limits cardis ability to diurese.  Plan: Follow up Dr. Kendrick Fries in 2 weeks CXR at follow up. BNP/Sed Rate ratio to determine inflammatory vs. Cardiogenic etiology. Communication with Cardiology re: chronic bilateral pleural efffusions.further work up.

## 2015-09-28 NOTE — Progress Notes (Signed)
Subjective:    Patient ID: Sandra Turner, female    DOB: Jan 07, 1941, 75 y.o.   MRN: [id number]  HPI 75 year old first seen by [facility] while hospitalized after an aortic valve replacement and maze procedure, she had significant mucus plugging requiring multiple bronchoscopies and courses of mechanical ventilation. Has a history of heavy mold exposure. Also has mild vocal cord paralysis, and chronic shortness of breath that is multifactorial due to chronic diastolic heart failure, atrial fibrillation, deconditioning with limited activity,oxygen dependent COPD, mild pulmonary hypertension, Aortic stenosis status post pericardial AVR in 01/2015.CKD stage 3, recent creatinine 2.2.   Significant Events: 03/2015: Bilateral Pleural Effusions first noted on CXR Echo 03/16/15: EF: 55-60% LV: Severe dilation RV: Dilated RA: Severely dilated PAP: 44 mmHg. Aortic Valve function stable  09/27/2014 ROV: Follow Up Patient presents today for follow up. She complains of increasing SOB.She is using her Flovent Disc twice daily. She states she has yellow brown secretions, but denies fever or chills.Chest x ray today shows chronic bilateral pleural effusions.She is using her oxygen at 2L. Chest x-ray results were reviewed personally by me and through consultation with Dr. Sherene Sires.  Past Medical History  Diagnosis Date  . Arthritis   . Chronic atrial fibrillation (HCC)     a. previously on Pradaxa - on Coumadin since valve surgery.  . Aortic stenosis     a. Severe - s/p pericardial AVR with MAZE 02/04/15.  Marland Kitchen Pneumonia 09/2014  . History of bronchitis 2015  . Family history of adverse reaction to anesthesia     Uncle with MH in the 60's,cousin in the 50's with MH  . 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 S/P aortic valve replacement with bioprosthetic valve and aortic root  enlargement 01/19/2015    23 mm [facility] Ease bovine pericardial tissue valve with bovine pericardial patch enlargement of the aortic root  . COPD (chronic obstructive pulmonary disease) (HCC)   . Normal coronary arteries     a. By cath 11/2014.  . Pulmonary hypertension (HCC)     a. Mod by cath 11/2014.  Marland Kitchen Chronic diastolic CHF (congestive heart failure) (HCC)   . Vocal cord paralysis     a. Dx post-AVR in 01/2015. Required PANDA, intubation during that admission.  . Pleural effusion   . AKI (acute kidney injury) (HCC)     a. H/o AKI after AVR in 01/2015.  . Tracheomalacia   . Anemia     a. 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    Current outpatient prescriptions:  .  albuterol (PROAIR HFA) 108 (90 BASE) MCG/ACT inhaler, Inhale 2 puffs into the lungs 4 (four) times daily as needed for wheezing or shortness of breath., Disp: , Rfl:  .  albuterol (PROVENTIL) (2.5 MG/3ML) 0.083% nebulizer solution, Take 3 mLs (2.5 mg total) by nebulization every 4 (four) hours as needed for wheezing or shortness of breath. (Patient taking differently: Take 2.5 mg by nebulization 4 (four) times daily. ), Disp: 75 mL, Rfl: 12 .  budesonide (PULMICORT) 0.25 MG/2ML nebulizer solution, Take 2 mLs (0.25 mg total) by nebulization 2 (two) times daily., Disp: 60 mL, Rfl: 11 .  CARTIA XT 180 MG 24 hr capsule, Take 180 mg by mouth daily. , Disp: , Rfl:  .  dabigatran (PRADAXA) 150 MG CAPS capsule, Take 1 capsule (150 mg total) by mouth 2 (two) times daily., Disp:  60 capsule, Rfl: 6 .  docusate sodium (COLACE) 100 MG capsule, Take 1 capsule (100 mg total) by mouth daily as needed for mild constipation., Disp: 30 capsule, Rfl: 1 .  FLOVENT DISKUS 250 MCG/BLIST AEPB, INHALE ONE PUFF BY MOUTH TWICE DAILY, Disp: 60 each, Rfl: 5 .  magnesium oxide (MAG-OX) 400 MG tablet, Take 400 mg by mouth 2 (two) times daily., Disp: , Rfl:  .  metolazone (ZAROXOLYN) 2.5 MG tablet, Take 1 tablet (2.5 mg total) by mouth daily., Disp: 5 tablet, Rfl:  0 .  OXYGEN, Inhale into the lungs. 2.5 lpm with exertion, Disp: , Rfl:  .  potassium chloride 20 MEQ TBCR, Take 40 mEq by mouth 2 (two) times daily., Disp: 120 tablet, Rfl: 6 .  torsemide (DEMADEX) 20 MG tablet, Take 60 mg Two Times Daily, Disp: 180 tablet, Rfl: 6 .  traMADol (ULTRAM) 50 MG tablet, Take 1 tablet (50 mg total) by mouth every 6 (six) hours as needed (pain)., Disp: 50 tablet, Rfl: 0 .  doxycycline (VIBRA-TABS) 100 MG tablet, Take 1 tablet (100 mg total) by mouth 2 (two) times daily., Disp: 14 tablet, Rfl: 0 .  predniSONE (DELTASONE) 10 MG tablet, Take 4 tabs daily x 2 days, 3 tabs daily x 2 days, 2 tabs daily x 2 days, 1 tab daily x 2 days then stop, Disp: 20 tablet, Rfl: 0  Allergies  Allergen Reactions  . Bee Venom Swelling  . Latex Rash  . Penicillins Rash    Review of Systems    Constitutional:   No  weight loss, night sweats,  Fevers, chills, fatigue, or  lassitude.  HEENT:   No headaches,  Difficulty swallowing,  Tooth/dental problems, or  Sore throat,                No sneezing, itching, ear ache, nasal congestion, post nasal drip,   CV:  No chest pain,  Orthopnea, PND,+ swelling in lower extremities, no anasarca, dizziness, palpitations, syncope.   GI  No heartburn, indigestion, abdominal pain, nausea, vomiting, diarrhea, change in bowel habits, loss of appetite, bloody stools.   Resp: Positive shortness of breath with exertion not at rest.  Positive excess mucus, positive productive cough,  No non-productive cough,  No coughing up of blood.  Positive change in color of mucus.  No wheezing.  No chest wall deformity  Skin: no rash or lesions.  GU: no dysuria, change in color of urine, no urgency or frequency.  No flank pain, no hematuria   MS:  No joint pain or swelling.  No decreased range of motion.  No back pain.  Psych:  No change in mood or affect. No depression or anxiety.  No memory loss.     Objective:   Physical Exam  Physical Exam:  General-  No distress,  A&Ox3, obese female ENT: No sinus tenderness, TM clear, pale nasal mucosa, no oral exudate,no post nasal drip, no LAN Cardiac: S1, S2, irregular rate rate and rhythm, systolic murmur Chest: No wheeze/ rales/ dullness per bases bilaterally , no accessory muscle use, no nasal flaring, no sternal retractions Abd.: Soft Non-tender, obese Ext: 3+ lower extremity edema Neuro:  Deconditioned Skin: No rashes, warm and dry Psych: normal mood and behavior         Assessment & Plan:

## 2015-09-28 NOTE — Telephone Encounter (Signed)
Called to give patient the results of her chest x-ray. There was no answer. I left a message requesting the patient call the office for results.

## 2015-09-28 NOTE — Patient Instructions (Addendum)
Continue using your Flovent Disc twice daily. Remember to rinse your mouth after each use.. Wear your oxygen, even at rest. Your saturations today at rest were 79%. Evaluation by [facility] for the over the shoulder portable oxygen device. We will get a chest x ray today and call you with the results. We will start a prednisone taper today. Prednisone taper; 10 mg tablets: 4 tabs x 2 days, 3 tabs x 2 days, 2 tabs x 2 days 1 tab x 2 days then stop. Doxycycline 100 mg twice daily for 7 days. Eat yogurt or take probiotics while on antibiotics. Follow up in 2 weeks with me  And with Dr. Kendrick Fries  At first available appointment. Please contact office for sooner follow up if symptoms do not improve or worsen or seek emergency care

## 2015-09-28 NOTE — Assessment & Plan Note (Signed)
Saturation at rest is 79% in the office.  Pt. Is not wearing her oxygen at home at rest, only "sometimes" with exertion. Last PFT's 8/16. Plan: Wear your oxygen at 2 L, even at rest. Your saturations today at rest were 79%. Evaluation by [facility] for the over the shoulder portable oxygen device. We will check a CXR today, and call results.

## 2015-09-28 NOTE — Assessment & Plan Note (Addendum)
Coughing up colorful mucus, ranging from green to brown. Denies fever or chills. Worsening SOB requiring more frequent use of her albuterol nebs. ( 4 times daily) Questionable compliance with medication regimen.  Plan: Continue using your Flovent Disc twice daily. Discussed that this is an important element of her treatment and she must take it twice daily as proscribed. Remember to rinse your mouth after each use.. CXR today. Doxycycline 100 BID x 7 days Prednisone taper; 10 mg tablets: 4 tabs x 2 days, 3 tabs x 2 days, 2 tabs x 2 days 1 tab x 2 days then stop. Eat yogurt or take probiotics while on antibiotics. Follow up in 2 weeks with me  And with Dr. Kendrick Fries  At first available appointment. Please contact office for sooner follow up if symptoms do not improve or worsen or seek emergency care

## 2015-09-28 NOTE — Assessment & Plan Note (Signed)
Pt. Presents with worsening cough, increased shortness of breath,sputum change to yellow brown. Denies fever and chills. She continues to use her Flovent disc twice daily and states she is compliant with its use..  Plan: Continue using your Flovent Disc twice daily. Remember to rinse your mouth after each use.. Wear your oxygen, even at rest. Your saturations today at rest were 79%. Evaluation by [facility] for the over the shoulder portable oxygen device.  chest x ray today We will start a prednisone taper today. Prednisone taper; 10 mg tablets: 4 tabs x 2 days, 3 tabs x 2 days, 2 tabs x 2 days 1 tab x 2 days then stop. Doxycycline 100 mg twice daily for 7 days. Eat yogurt or take probiotics while on antibiotics. Follow up in 2 weeks with me  And with Dr. Kendrick Fries  At first available appointment. Please contact office for sooner follow up if symptoms do not improve or worsen or seek emergency care

## 2015-09-29 NOTE — Progress Notes (Signed)
Chart and office note reviewed in detail along with available xrays/ labs > agree with a/p as outlined plus 1) she has Chronic hypoxemic and hypercarbic resp failure so goal for 02 is high 80's/ low 90's and needs to wear her 02 more consistently.  2) chronic bilateral effusions on cxr with severe LAE on last echo c/w cardiogenic effusions but difficult to control given she also has CRI > f/u cards / Dr Kendrick Fries planned

## 2015-09-30 ENCOUNTER — Telehealth: Payer: Self-pay | Admitting: Acute Care

## 2015-09-30 NOTE — Telephone Encounter (Signed)
[phone], pt cb

## 2015-09-30 NOTE — Telephone Encounter (Signed)
LMTCB x1 for pt.  

## 2015-09-30 NOTE — Telephone Encounter (Signed)
Called spoke with pt. She is returning Sandra Turner's call for CXR results. Please advise Maralyn Sago thanks

## 2015-10-01 ENCOUNTER — Telehealth: Payer: Self-pay | Admitting: Acute Care

## 2015-10-01 NOTE — Telephone Encounter (Signed)
I have called Sandra Turner the results of her chest x-ray. I explained that since July 2016 she has had some fluid in the lining of her lungs. I explained that her chest x-ray is relatively unchanged since July. I encouraged her to make an appointment with her cardiologist, and she informed me that she has an appointment scheduled for 10/12/2015 with Bailey Mech in [place]. This Pha stated that she is taking her doxycycline as we prescribed. I reminded her to wear her oxygen at all times. I told her that her goal oxygen saturation is between 88 and 90%. I reminded her of her appointment with me on 10/12/2015, And her appointment with Dr. Kendrick Fries 12/02/2015. She verbalized understanding of all the above and had no further questions at the time we ended the call.

## 2015-10-03 NOTE — Telephone Encounter (Signed)
Per SG:  I have called Mrs. Sandra Turner the results of her chest x-ray. I explained that since July 2016 she has had some fluid in the lining of her lungs. I explained that her chest x-ray is relatively unchanged since July. I encouraged her to make an appointment with her cardiologist, and she informed me that she has an appointment scheduled for 10/12/2015 with Bailey Mech in [place]. This Cotten stated that she is taking her doxycycline as we prescribed. I reminded her to wear her oxygen at all times. I told her that her goal oxygen saturation is between 88 and 90%. I reminded her of her appointment with me on 10/12/2015, And her appointment with Dr. Kendrick Fries 12/02/2015. She verbalized understanding of all the above and had no further questions at the time we ended the call.

## 2015-10-12 ENCOUNTER — Ambulatory Visit (INDEPENDENT_AMBULATORY_CARE_PROVIDER_SITE_OTHER): Payer: Medicare Other | Admitting: Acute Care

## 2015-10-12 ENCOUNTER — Ambulatory Visit (INDEPENDENT_AMBULATORY_CARE_PROVIDER_SITE_OTHER)
Admission: RE | Admit: 2015-10-12 | Discharge: 2015-10-12 | Disposition: A | Discharge status: Home / Self Care | Payer: Medicare Other | Source: Ambulatory Visit | Attending: Acute Care | Admitting: Acute Care

## 2015-10-12 ENCOUNTER — Encounter: Payer: Self-pay | Admitting: Acute Care

## 2015-10-12 VITALS — BP 114/52 | HR 74 | Ht 62.0 in | Wt 214.0 lb

## 2015-10-12 DIAGNOSIS — Z23 Encounter for immunization: Secondary | ICD-10-CM

## 2015-10-12 DIAGNOSIS — J90 Pleural effusion, not elsewhere classified: Secondary | ICD-10-CM

## 2015-10-12 DIAGNOSIS — J9601 Acute respiratory failure with hypoxia: Secondary | ICD-10-CM | POA: Diagnosis not present

## 2015-10-12 DIAGNOSIS — J45901 Unspecified asthma with (acute) exacerbation: Secondary | ICD-10-CM | POA: Diagnosis not present

## 2015-10-12 NOTE — Progress Notes (Signed)
Subjective:    Patient ID: Sandra Turner, female    DOB: Jun 23, 1941, 75 y.o.   MRN: [id number]  HPI   Expand All Collapse All     Subjective:    Patient ID: Sandra Turner, female DOB: 02-04-1941, 75 y.o. MRN: [id number]  HPI 75 year old first seen by [facility] while hospitalized after an aortic valve replacement and maze procedure, she had significant mucus plugging requiring multiple bronchoscopies and courses of mechanical ventilation. Has a history of heavy mold exposure. Also has mild vocal cord paralysis, and chronic shortness of breath that is multifactorial due to chronic diastolic heart failure, atrial fibrillation, deconditioning with limited activity,oxygen dependent COPD, mild pulmonary hypertension, Aortic stenosis status post pericardial AVR in 01/2015.CKD stage 3, recent creatinine 2.2.       Significant Events: 03/2015: Bilateral Pleural Effusions first noted on CXR 03/2015 Echo 03/16/15: EF: 55-60% LV: Severe dilation RV: Dilated RA: Severely dilated PAP: 44 mmHg. Aortic Valve function stable  09/28/15 : Chest 2 view  IMPRESSION: Chronic congestive heart failure unchanged compared prior exam.  Bilateral pleural effusions with consolidation of bilateral lung bases unchanged.  10/12/15 : Chest 2 View:  IMPRESSION: 1. Regressed but not resolved bilateral pleural effusions, small right greater than left residual. 2. Mildly improved lung base ventilation. Interstitial opacity favored at least in part due to interstitial edema not significantly changed since January but regressed compared to December. 3. No new cardiopulmonary abnormality.  10/12/15: Follow Up visit:  Patient presents today for follow up of visit 09/28/15. She has completed her previously prescribed course of doxycycline and prednisone taper. She she states that she continues to have a cough , her breathing is better but she denies fever or chills. Sputum color has a slight brown tinge  that she suspects is secondary to some nosebleeds caused by her oxygen use. Chest x ray today shows slight improvement in chronic bilateral pleural effusions (present since July 2016) Suspect cardiac etiology .She is scheduled to see her cardiologist in [place] on 10/19/2015 .She is using her oxygen at 2L. She does complain of significantly dry nasal passages due to use of oxygen, but she is using her oxygen as prescribed. We checked oxygen saturations today on room air and patient saturations were 82%. She understands that this is too low and knows that she must wear her oxygen even at rest. Chest x-ray results today were reviewed personally by me and  Dr. Sherene Sires. I reviewed the results of her chest x-ray with her. There are no clinical signs or symptoms of infection at present.   Current outpatient prescriptions:  .  albuterol (PROAIR HFA) 108 (90 BASE) MCG/ACT inhaler, Inhale 2 puffs into the lungs 4 (four) times daily as needed for wheezing or shortness of breath., Disp: , Rfl:  .  albuterol (PROVENTIL) (2.5 MG/3ML) 0.083% nebulizer solution, Take 3 mLs (2.5 mg total) by nebulization every 4 (four) hours as needed for wheezing or shortness of breath. (Patient taking differently: Take 2.5 mg by nebulization 4 (four) times daily. ), Disp: 75 mL, Rfl: 12 .  budesonide (PULMICORT) 0.25 MG/2ML nebulizer solution, Take 2 mLs (0.25 mg total) by nebulization 2 (two) times daily., Disp: 60 mL, Rfl: 11 .  CARTIA XT 180 MG 24 hr capsule, Take 180 mg by mouth daily. , Disp: , Rfl:  .  dabigatran (PRADAXA) 150 MG CAPS capsule, Take 1 capsule (150 mg total) by mouth 2 (two) times daily., Disp: 60 capsule, Rfl: 6 .  docusate sodium (COLACE) 100  MG capsule, Take 1 capsule (100 mg total) by mouth daily as needed for mild constipation., Disp: 30 capsule, Rfl: 1 .  FLOVENT DISKUS 250 MCG/BLIST AEPB, INHALE ONE PUFF BY MOUTH TWICE DAILY, Disp: 60 each, Rfl: 5 .  magnesium oxide (MAG-OX) 400 MG tablet, Take 400 mg by  mouth 2 (two) times daily., Disp: , Rfl:  .  metolazone (ZAROXOLYN) 2.5 MG tablet, Take 1 tablet (2.5 mg total) by mouth daily., Disp: 5 tablet, Rfl: 0 .  OXYGEN, Inhale into the lungs. 2.5 lpm with exertion, Disp: , Rfl:  .  potassium chloride 20 MEQ TBCR, Take 40 mEq by mouth 2 (two) times daily., Disp: 120 tablet, Rfl: 6 .  torsemide (DEMADEX) 20 MG tablet, Take 60 mg Two Times Daily, Disp: 180 tablet, Rfl: 6 .  traMADol (ULTRAM) 50 MG tablet, Take 1 tablet (50 mg total) by mouth every 6 (six) hours as needed (pain)., Disp: 50 tablet, Rfl: 0   Past Medical History  Diagnosis Date  . Arthritis   . Chronic atrial fibrillation (HCC)     a. previously on Pradaxa - on Coumadin since valve surgery.  . Aortic stenosis     a. Severe - s/p pericardial AVR with MAZE 02/04/15.  Marland Kitchen Pneumonia 09/2014  . History of bronchitis 2015  . Family history of adverse reaction to anesthesia     Uncle with MH in the 60's,cousin in the 75's with MH  . 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 COPD (chronic obstructive pulmonary disease) (HCC)   . Normal coronary arteries     a. By cath 11/2014.  . Pulmonary hypertension (HCC)     a. Mod by cath 11/2014.  Marland Kitchen Chronic diastolic CHF (congestive heart failure) (HCC)   . Vocal cord paralysis     a. Dx post-AVR in 01/2015. Required PANDA, intubation during that admission.  . Pleural effusion   . AKI (acute kidney injury) (HCC)     a. H/o AKI after AVR in 01/2015.  . Tracheomalacia   . Anemia     a. 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    Allergies  Allergen Reactions  . Bee Venom Swelling  . Doxycycline     Hoarseness / throat irritation   . Latex Rash  . Penicillins Rash    Review of Systems Constitutional:   No  weight loss, night sweats,  Fevers, chills, fatigue, or  lassitude.  HEENT:   No headaches,  Difficulty swallowing,  Tooth/dental problems, or  Sore throat,                No sneezing, itching, ear ache, nasal congestion, post nasal drip,   CV:  No chest pain,  Orthopnea, PND,  Slight swelling in lower extremities, no anasarca, dizziness, palpitations, syncope.   GI  No heartburn, indigestion, abdominal pain, nausea, vomiting, diarrhea, change in bowel habits, loss of appetite, bloody stools.   Resp: + shortness of breath with exertion not  at rest.  + excess mucus, + productive cough,  No non-productive cough,  No coughing up of blood.  + change in color of mucus.  No wheezing.  No chest wall deformity  Skin: no  rash or lesions.  GU: no dysuria, change in color of urine, no urgency or frequency.  No flank pain, no hematuria   MS:  No joint pain or swelling.  No decreased range of motion.  No back pain.  Psych:  No change in mood or affect. No depression or anxiety.  No memory loss.        Objective:   Physical Exam BP 114/52 mmHg  Pulse 74  Ht 5\' 2"  (1.575 m)  Wt 214 lb (97.07 kg)  BMI 39.13 kg/m2  SpO2 96%  Physical Exam:  General- No distress,  A&Ox3, elderly female in a wheelchair ENT: No sinus tenderness, TM clear, pale nasal mucosa, no oral exudate,no post nasal drip, no LAN Cardiac: S1, S2, regular rate and rhythm, no murmur Chest: No wheeze/ rales/ breath sounds diminished bilaterally in the bases, no accessory muscle use, no nasal flaring, no sternal retractions Abd.: Soft Non-tender Ext: No clubbing cyanosis, 2+ edema bilaterally lower extremities. Neuro:  Baseline strength Skin: No rashes, warm and dry Psych: normal mood and behavior      Assessment & Plan:

## 2015-10-12 NOTE — Assessment & Plan Note (Signed)
Patient states she is feeling better. Continues to cough, but denies fever or chills. Chest x-ray today with slight interval improvement of chronic bilateral pleural effusions since July 2016.  Plan: Continue using Flovent Diskus twice daily. Rinse mouth after each use.Continue your Albuterol nebs 4 times a day. Wear oxygen even at rest. Saturations today on room air were 82%. Evaluation by [facility] for humidification of oxygen. Follow up with cardiology 10/19/15 Follow up with [facility] 12/02/15 Please contact office for sooner follow up if symptoms do not improve or worsen or seek emergency care

## 2015-10-12 NOTE — Assessment & Plan Note (Signed)
Saturation on room air today was 81% Complaining of very dry nasal passages with some nose bleeds due to drying oxygen Plan: Continue oxygen for activity and at rest. Oxygen saturation goals should always be 88-90% Use the nasal saline to try to moisten your nasal airways. Can also try saline gel AYR for your nasal dryness. [facility] to evaluate for humidification of home oxygen.

## 2015-10-12 NOTE — Patient Instructions (Signed)
You look better today. You chest x ray is improved. We will have [facility] add humidity to your oxygen. Use the nasal spray to keep your nasal passages moist while using oxygen. You can also try the AYR saline gel. Continue your Flovent twice daily Continue your Albuterol nebs 4 times a day. Follow up with cardiology 10/19/15 Follow up with [facility] 12/02/15 We will check your oxygen level on room air before you leave. Remember your goal saturation is >88%. Please contact office for sooner follow up if symptoms do not improve or worsen or seek emergency care

## 2015-10-12 NOTE — Assessment & Plan Note (Addendum)
Slightly better but not resolved per CXR today. Has a follow up appointment with Cards 10/19/15 Suspect cardiac etiology. Pt. Is now wearing her oxygen.  The hope is that the decreased strain on her heart caused by her chronic hypoxia may help in resolution of the  Effusions. Plan: Continue wearing oxygen for goal saturations of 88-90% Follow up with [place] sooner if you feel more short of breath than is your baseline. Follow up with [facility] 12/02/15 as is already scheduled. Cardiology appointment 10/19/15 With next acute flare, check BNP/Sed Rate ratio to determine inflammatory vs. Cardiogenic etiology.

## 2015-10-13 NOTE — Progress Notes (Signed)
Chart and office note reviewed in detail along with available xrays > agree with a/p as outlined though I would have a low threshold to d/c dpi flovent as I have not only found it not useful in cough but sometimes actually the cause of the cough -of course she would have to be trained in alternative device or consider for pulmocort neb if can't learn hfa

## 2015-10-19 ENCOUNTER — Ambulatory Visit (INDEPENDENT_AMBULATORY_CARE_PROVIDER_SITE_OTHER): Payer: Medicare Other | Admitting: Cardiology

## 2015-10-19 ENCOUNTER — Encounter: Payer: Self-pay | Admitting: Cardiology

## 2015-10-19 ENCOUNTER — Other Ambulatory Visit (HOSPITAL_COMMUNITY)
Admission: RE | Admit: 2015-10-19 | Discharge: 2015-10-19 | Disposition: A | Discharge status: Home / Self Care | Payer: Medicare Other | Source: Ambulatory Visit | Attending: Cardiology | Admitting: Cardiology

## 2015-10-19 VITALS — BP 122/56 | HR 89 | Ht 62.0 in | Wt 220.0 lb

## 2015-10-19 DIAGNOSIS — Z79899 Other long term (current) drug therapy: Secondary | ICD-10-CM | POA: Diagnosis present

## 2015-10-19 DIAGNOSIS — I5033 Acute on chronic diastolic (congestive) heart failure: Secondary | ICD-10-CM | POA: Diagnosis not present

## 2015-10-19 DIAGNOSIS — J9611 Chronic respiratory failure with hypoxia: Secondary | ICD-10-CM

## 2015-10-19 DIAGNOSIS — Z954 Presence of other heart-valve replacement: Secondary | ICD-10-CM | POA: Diagnosis not present

## 2015-10-19 DIAGNOSIS — I482 Chronic atrial fibrillation, unspecified: Secondary | ICD-10-CM

## 2015-10-19 DIAGNOSIS — N183 Chronic kidney disease, stage 3 unspecified: Secondary | ICD-10-CM

## 2015-10-19 DIAGNOSIS — Z952 Presence of prosthetic heart valve: Secondary | ICD-10-CM

## 2015-10-19 LAB — BASIC METABOLIC PANEL
ANION GAP: 10 (ref 5–15)
BUN: 46 mg/dL — AB (ref 6–20)
CO2: 30 mmol/L (ref 22–32)
Calcium: 8.9 mg/dL (ref 8.9–10.3)
Chloride: 97 mmol/L — ABNORMAL LOW (ref 101–111)
Creatinine, Ser: 2.34 mg/dL — ABNORMAL HIGH (ref 0.44–1.00)
GFR calc Af Amer: 23 mL/min — ABNORMAL LOW (ref >60)
GFR, EST NON AFRICAN AMERICAN: 20 mL/min — AB (ref >60)
GLUCOSE: 117 mg/dL — AB (ref 65–99)
POTASSIUM: 5.3 mmol/L — AB (ref 3.5–5.1)
Sodium: 137 mmol/L (ref 135–145)

## 2015-10-19 MED ORDER — TORSEMIDE 20 MG PO TABS: 40.0000 mg | ORAL_TABLET | Freq: Two times a day (BID) | ORAL | Status: AC

## 2015-10-19 NOTE — Progress Notes (Signed)
Cardiology Office Note  Date: 10/19/2015   ID: Sandra Turner, DOB 1940/09/14, MRN [id number]  PCP: [name], MD  Primary Cardiologist: Nona Dell, MD   Chief Complaint  Patient presents with  . Aortic Stenosis  . Atrial Fibrillation    History of Present Illness: Sandra Turner is a medically complex 75 y.o. female last seen in January. She is here today for a follow-up visit. Overall reports no major change in her symptoms, chronic shortness of breath requiring oxygen, also significant bilateral leg edema and lymphedema.  She continues to follow in the Pulmonary division, I reviewed the recent note from February 8. Follow-up chest x-ray shows improving pleural effusions. I see that she was treated with a course of doxycycline and prednisone.  Her weight is up about 5 pounds from the last visit. We reviewed her medications. She tells me that she has been taking Demadex 40 mg once daily, had been on a much higher dose previously. Sounds like there was some confusion in her prescription instructions. I have recommended that we go to Demadex 40 mg twice daily, and we will follow-up on the BMET that is pending for reassessment of renal function and potassium.  She remains in atrial fibrillation, although rate controlled without significant palpitations. I am not pushing for cardioversion as I doubt that she will maintain sinus rhythm very well over time, and previously did not tolerate amiodarone.  She does not report any bleeding problems on Pradaxa.  Past Medical History  Diagnosis Date  . Arthritis   . Chronic atrial fibrillation (HCC)     a. previously on Pradaxa - on Coumadin since valve surgery.  . Aortic stenosis     a. Severe - s/p pericardial AVR with MAZE 02/04/15.  Marland Kitchen Pneumonia 09/2014  . History of bronchitis 2015  . Family history of adverse reaction to anesthesia     Uncle with MH in the 60's,cousin in the 19's with MH  . Malignant hyperthermia     Patient without  known history (no testing, no surgeries prior to 01/17/15), but reported biopsy proven MH in aunts/uncles/first cousins  . 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 COPD (chronic obstructive pulmonary disease) (HCC)   . Normal coronary arteries     a. By cath 11/2014.  . Pulmonary hypertension (HCC)     a. Mod by cath 11/2014.  Marland Kitchen Chronic diastolic CHF (congestive heart failure) (HCC)   . Vocal cord paralysis     a. Dx post-AVR in 01/2015. Required PANDA, intubation during that admission.  . Pleural effusion   . AKI (acute kidney injury) (HCC)     a. H/o AKI after AVR in 01/2015.  . Tracheomalacia   . Anemia     a. 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    Past Surgical History  Procedure Laterality Date  . None    . Left and right heart catheterization with coronary angiogram N/A 11/01/2014    Procedure: LEFT AND RIGHT HEART CATHETERIZATION WITH CORONARY ANGIOGRAM;  Surgeon: [name], MD;  Location: [facility] CATH LAB;  Service: Cardiovascular;  Laterality: N/A;  . Aortic valve replacement N/A 01/19/2015    Procedure: AORTIC VALVE REPLACEMENT (AVR);  Surgeon: Purcell Nails, MD;  Location: [facility] OR;  Service: Open Heart Surgery;  Laterality: N/A;  . Maze N/A 01/19/2015  Procedure: MAZE;  Surgeon: Purcell Nails, MD;  Location: [facility] OR;  Service: Open Heart Surgery;  Laterality: N/A;  . Aortic root enlargement N/A 01/19/2015    Procedure:  AORTIC ROOT ENLARGEMENT;  Surgeon: Purcell Nails, MD;  Location: [facility] OR;  Service: Open Heart Surgery;  Laterality: N/A;  . Tee without cardioversion N/A 01/19/2015    Procedure: TRANSESOPHAGEAL ECHOCARDIOGRAM (TEE);  Surgeon: Purcell Nails, MD;  Location: [facility] OR;  Service: Open Heart Surgery;  Laterality: N/A;    Current Outpatient Prescriptions  Medication Sig Dispense Refill  .  albuterol (PROAIR HFA) 108 (90 BASE) MCG/ACT inhaler Inhale 2 puffs into the lungs 4 (four) times daily as needed for wheezing or shortness of breath.    Marland Kitchen albuterol (PROVENTIL) (2.5 MG/3ML) 0.083% nebulizer solution Take 3 mLs (2.5 mg total) by nebulization every 4 (four) hours as needed for wheezing or shortness of breath. (Patient taking differently: Take 2.5 mg by nebulization 4 (four) times daily. ) 75 mL 12  . budesonide (PULMICORT) 0.25 MG/2ML nebulizer solution Take 2 mLs (0.25 mg total) by nebulization 2 (two) times daily. 60 mL 11  . CARTIA XT 180 MG 24 hr capsule Take 180 mg by mouth daily.     . dabigatran (PRADAXA) 150 MG CAPS capsule Take 1 capsule (150 mg total) by mouth 2 (two) times daily. 60 capsule 6  . docusate sodium (COLACE) 100 MG capsule Take 1 capsule (100 mg total) by mouth daily as needed for mild constipation. 30 capsule 1  . FLOVENT DISKUS 250 MCG/BLIST AEPB INHALE ONE PUFF BY MOUTH TWICE DAILY 60 each 5  . magnesium oxide (MAG-OX) 400 MG tablet Take 400 mg by mouth 2 (two) times daily.    . metolazone (ZAROXOLYN) 2.5 MG tablet Take 1 tablet (2.5 mg total) by mouth daily. 5 tablet 0  . OXYGEN Inhale into the lungs. 2.5 lpm with exertion    . potassium chloride 20 MEQ TBCR Take 40 mEq by mouth 2 (two) times daily. 120 tablet 6  . traMADol (ULTRAM) 50 MG tablet Take 1 tablet (50 mg total) by mouth every 6 (six) hours as needed (pain). 50 tablet 0  . torsemide (DEMADEX) 20 MG tablet Take 2 tablets (40 mg total) by mouth 2 (two) times daily. 120 tablet 6   No current facility-administered medications for this visit.   Allergies:  Bee venom; Doxycycline; Latex; and Penicillins   Social History: The patient  reports that she has never smoked. She has never used smokeless tobacco. She reports that she does not drink alcohol or use illicit drugs.   ROS:  Please see the history of present illness. Otherwise, complete review of systems is positive for NYHA class 3 dyspnea, leg  edema and lymphedema.  All other systems are reviewed and negative.   Physical Exam: VS:  BP 122/56 mmHg  Pulse 89  Ht  (1.575 m)  Wt 220 lb (99.791 kg)  BMI 40.23 kg/m2  SpO2 95%, BMI Body mass index is 40.23 kg/(m^2).  Wt Readings from Last 3 Encounters:  10/19/15 220 lb (99.791 kg)  10/12/15 214 lb (97.07 kg)  09/06/15 216 lb 9.6 oz (98.249 kg)    General: Obese woman seated in wheelchair, no distress. HEENT: Conjunctiva and lids normal, oropharynx clear. Neck: Supple, elevated JVP present, no carotid bruits, no thyromegaly. Lungs: Decreased breath sounds without wheeze, nonlabored breathing at rest. Cardiac: Irregularly irregular, no S3, 2/6 systolic murmur, no pericardial rub. Abdomen: Protuberant, nontender, bowel sounds  present. Extremities: Bilateral leg edema and stasis with lymphedema, distal pulses 1+. Skin: Warm and dry. Musculoskeletal: No kyphosis. Neuropsychiatric: Alert and oriented x3, affect grossly appropriate.  ECG: I personally reviewed the prior tracing from 05/22/2015 which showed rate-controlled atrial fibrillation with rightward axis.  Recent Labwork: 03/12/2015: B Natriuretic Peptide 178.0* 03/13/2015: TSH 4.069 05/23/2015: ALT 17; AST 27 05/27/2015: Magnesium 2.0 05/30/2015: Hemoglobin 8.1*; Platelets 227 08/16/2015: BUN 58*; Creatinine, Ser 2.34*; Potassium 3.4*; Sodium 135   Other Studies Reviewed Today:  Echocardiogram 03/16/2015: Study Conclusions  - Left ventricle: The cavity size was normal. Wall thickness was increased in a pattern of mild LVH. Systolic function was normal. The estimated ejection fraction was in the range of 55% to 60%. - Mitral valve: Mildly calcified annulus. Normal thickness leaflets . There was mild regurgitation. - Left atrium: The atrium was severely dilated. - Right ventricle: The cavity size was moderately dilated. It shares the apex with the LV. Systolic function was moderately to severely reduced. RV  TAPSE is 1.0 cm. Tricuspid anular systolic tissue velocity is 7 cm/s. - Right atrium: The atrium was severely dilated. - Tricuspid valve: There was mild-moderate regurgitation. - Pulmonary arteries: Systolic pressure was moderately increased. PA peak pressure: 44 mm Hg (S). - Pericardium, extracardiac: There is a large left pleural effusion. - Technically difficult study.  Chest x-ray 10/12/2015: IMPRESSION: 1. Regressed but not resolved bilateral pleural effusions, small right greater than left residual. 2. Mildly improved lung base ventilation. Interstitial opacity favored at least in part due to interstitial edema not significantly changed since January but regressed compared to December. 3. No new cardiopulmonary abnormality.  Assessment and Plan:  1. Acute on chronic diastolic heart failure. She has inadvertently been on lower dose Demadex and her weight is up about 5 pounds. Recent chest x-ray did show some improvement in bilateral pleural effusions however. We will increase Demadex to 40 mg twice daily and follow-up BMET which is pending. May need higher dose. She also has Zaroxolyn to use as needed.  2. Chronic atrial fibrillation. Heart rate controlled on no specific medication and no sense of palpitations. She will continue on Pradaxa. I doubt that cardioversion would be successful long-term, and we are not pursuing it at this point. She previously did not tolerate amiodarone.  3. Aortic stenosis status post pericardial AVR. Will plan to obtain follow-up echocardiogram in about 6 months.  4. Hypoxic respiratory failure with history of bronchitis, tracheomalacia, and vocal cord dysfunction. She is on chronic oxygen and followed by the Pulmonary division.  5. CKD, stage 3. Creatinine 2.3 in December 2016. Follow-up lab work pending.  Current medicines were reviewed with the patient today.  Disposition: FU with me in 1 month.   Signed, [name], MD,  [facility] 10/19/2015 3:09 PM    [facility] at [facility] [address] [place], [place] [address] Phone: [phone]; Fax: [phone]

## 2015-10-19 NOTE — Patient Instructions (Addendum)
Your physician recommends that you schedule a follow-up appointment in: 1 month with Dr [name]   Take Torsemide (Demadex) 2 tablets (40 mg total) twice a day   If you need a refill on your cardiac medications before your next appointment, please call your pharmacy.     Thank you for choosing [facility] !

## 2015-10-20 ENCOUNTER — Telehealth: Payer: Self-pay

## 2015-10-20 DIAGNOSIS — Z79899 Other long term (current) drug therapy: Secondary | ICD-10-CM

## 2015-10-20 MED ORDER — POTASSIUM CHLORIDE ER 20 MEQ PO TBCR: EXTENDED_RELEASE_TABLET | ORAL | Status: AC

## 2015-10-20 NOTE — Telephone Encounter (Signed)
Left messgae for pt to call back. Put in lab orders. Changed dosage on potassium to 40 meq daily.

## 2015-10-20 NOTE — Telephone Encounter (Signed)
-----   Message from Samuel G McDowell, MD sent at 10/19/2015  4:53 PM EST ----- Reviewed. Creatinine stable at 2.3. Potassium up to 5.3. Have her cut KCl back to 40 mEq daily. Needs follow-up BMET for her next office visit. 

## 2015-10-20 NOTE — Telephone Encounter (Signed)
-----   Message from [name], MD sent at 10/19/2015  4:53 PM EST ----- Reviewed. Creatinine stable at 2.3. Potassium up to 5.3. Have her cut KCl back to 40 mEq daily. Needs follow-up BMET for her next office visit.

## 2015-11-01 ENCOUNTER — Other Ambulatory Visit: Payer: Self-pay | Admitting: Adult Health

## 2015-11-07 ENCOUNTER — Other Ambulatory Visit: Payer: Self-pay | Admitting: Adult Health

## 2015-11-24 ENCOUNTER — Other Ambulatory Visit: Payer: Self-pay | Admitting: Cardiology

## 2015-11-24 MED ORDER — METOLAZONE 2.5 MG PO TABS: 2.5000 mg | ORAL_TABLET | Freq: Every day | ORAL | Status: DC

## 2015-11-24 NOTE — Telephone Encounter (Signed)
°  1. Which medications need to be refilled? (please list name of each medication and dose if known) Metolazone 2.5mg   2. Which pharmacy/location (including street and city if local pharmacy) is medication to be sent to? [place] [facility]  3. Do they need a 30 day or 90 day supply? 30 day

## 2015-11-24 NOTE — Telephone Encounter (Signed)
Refill complete 

## 2015-11-30 ENCOUNTER — Ambulatory Visit: Payer: Medicare Other | Admitting: Cardiology

## 2015-12-02 ENCOUNTER — Ambulatory Visit: Payer: Medicare Other | Admitting: Pulmonary Disease

## 2015-12-30 ENCOUNTER — Ambulatory Visit: Payer: Medicare Other | Admitting: Pulmonary Disease

## 2016-01-02 ENCOUNTER — Other Ambulatory Visit: Payer: Self-pay | Admitting: Adult Health

## 2016-01-11 ENCOUNTER — Encounter: Payer: Medicare Other | Admitting: Cardiology

## 2016-01-11 ENCOUNTER — Encounter: Payer: Self-pay | Admitting: Cardiology

## 2016-01-11 NOTE — Progress Notes (Signed)
Patient canceled.  This encounter was created in error - please disregard. 

## 2016-01-23 ENCOUNTER — Encounter: Payer: Self-pay | Admitting: Thoracic Surgery (Cardiothoracic Vascular Surgery)

## 2016-01-23 ENCOUNTER — Ambulatory Visit (INDEPENDENT_AMBULATORY_CARE_PROVIDER_SITE_OTHER): Payer: Medicare Other | Admitting: Thoracic Surgery (Cardiothoracic Vascular Surgery)

## 2016-01-23 VITALS — BP 127/58 | HR 77 | Resp 20 | Ht 62.0 in | Wt 230.0 lb

## 2016-01-23 DIAGNOSIS — Z953 Presence of xenogenic heart valve: Secondary | ICD-10-CM

## 2016-01-23 DIAGNOSIS — I482 Chronic atrial fibrillation, unspecified: Secondary | ICD-10-CM

## 2016-01-23 DIAGNOSIS — I350 Nonrheumatic aortic (valve) stenosis: Secondary | ICD-10-CM

## 2016-01-23 DIAGNOSIS — Z9889 Other specified postprocedural states: Secondary | ICD-10-CM | POA: Diagnosis not present

## 2016-01-23 DIAGNOSIS — Z954 Presence of other heart-valve replacement: Secondary | ICD-10-CM | POA: Diagnosis not present

## 2016-01-23 DIAGNOSIS — Z8679 Personal history of other diseases of the circulatory system: Secondary | ICD-10-CM

## 2016-01-23 NOTE — Patient Instructions (Addendum)

## 2016-01-23 NOTE — Progress Notes (Signed)
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 The patient's postoperative recovery was complicated by chronic respiratory failure secondary to chronic lung disease with asthmatic bronchitis and history of mold exposure further complicated by the presence of morbid obesity and pulmonary hypertension. Follow-up echocardiogram performed 03/16/2015 revealed normal functioning bioprosthetic tissue valve in aortic position with low transvalvular gradient and normal left ventricular systolic function. However, echocardiogram also confirmed the presence of at least moderate right ventricular chamber enlargement, moderate to severe right ventricular systolic dysfunction, and pulmonary hypertension. She was last seen here in our office on 07/25/2015 at which time she was still struggling with chronic dyspnea and severe bilateral lower extremity edema.  Since then she has been seen on several occasions by [name] and she is scheduled for follow-up appointment with Dr. Kendrick Fries next week.  She returns to our office for routine follow-up today. She remains oxygen dependent with severe exertional shortness of breath. Her functional capacity has not improved. She gets short of breath with very mild activity and occasionally at rest. She does not get any chest pain or chest tightness. She still has problems with severe abdominal swelling and lower extremity edema. She occasionally takes additional Metolazone when she feels as though she needs extra fluid medication, but she does not record her weight on a daily basis. She denies any persistent cough or  hemoptysis. 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   Current Outpatient Prescriptions  Medication Sig Dispense Refill  . albuterol (PROAIR HFA) 108 (90 BASE) MCG/ACT inhaler Inhale 2 puffs into the lungs 4 (four) times daily as needed for wheezing or shortness of breath.    Marland Kitchen albuterol (PROVENTIL) (2.5 MG/3ML) 0.083% nebulizer solution Take 3 mLs (2.5 mg total) by nebulization every 4 (four) hours as needed for wheezing or shortness of breath. (Patient taking differently: Take 2.5 mg by nebulization 4 (four) times daily. ) 75 mL 12  . budesonide (PULMICORT) 0.25 MG/2ML nebulizer solution Take 2 mLs (0.25 mg total) by nebulization 2 (two) times daily. 60 mL 11  . CARTIA XT 180 MG 24 hr capsule Take 180 mg by mouth daily.     . Coenzyme Q10 (CO Q-10) 100 MG CAPS Take by mouth daily.    Marland Kitchen docusate sodium (COLACE) 100 MG capsule Take 1 capsule (100 mg total) by mouth daily as needed for mild constipation. 30 capsule 1  . FLOVENT DISKUS 250 MCG/BLIST AEPB INHALE ONE PUFF BY MOUTH TWICE DAILY 60 each 5  . magnesium oxide (MAG-OX) 400 MG tablet Take 400 mg by mouth 2 (two) times daily.    . metolazone (ZAROXOLYN) 2.5 MG tablet TAKE ONE TABLET BY MOUTH ONCE DAILY 5 tablet 0  . OXYGEN Inhale into the lungs. 2.5 lpm with exertion    . Potassium Chloride ER 20 MEQ TBCR Take 2 tablets (40 meq) daily 120 tablet 6  . PRADAXA 150 MG CAPS capsule TAKE ONE CAPSULE BY MOUTH TWICE DAILY 60 capsule 6  . torsemide (DEMADEX) 20 MG tablet Take 2 tablets (40 mg total)  by mouth 2 (two) times daily. 120 tablet 6  . traMADol (ULTRAM) 50 MG tablet Take 1 tablet (50 mg total) by mouth every 6 (six) hours as needed (pain). 50 tablet 0   No current facility-administered medications for this visit.      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 Her most recent echocardiogram revealed normal left ventricular systolic function with normal functioning bioprosthetic tissue valve in aortic position but significant right ventricular chamber enlargement and right ventricular systolic dysfunction with pulmonary hypertension.  She remains in chronic rate controlled persistent atrial fibrillation. It is difficult to know for certain how much her underlying chronic lung disease contributes to her problems.    Plan:  We have not recommended any changes to the patient's current medications at this time. We will defer any subsequent decision-making regarding long-term treatment of chronic failure to Dr. Diona BrownerMcDowell and colleagues, including whether or not the patient might benefit from follow-up in the advanced heart failure clinic. All of the patient's questions have been addressed. She will call and return to see us should specific problems or questions arise.  I spent in excess of 15 minutes during the conduct of this office consultation and >50% of this time involved direct face-to-face encounter with the patient for counseling and/or coordination of their care.    Salvatore Decentlarence H. Cornelius Moraswen, MD 01/23/2016 5:05 PM

## 2016-02-01 ENCOUNTER — Ambulatory Visit: Payer: Medicare Other | Admitting: Pulmonary Disease

## 2016-02-12 ENCOUNTER — Inpatient Hospital Stay (HOSPITAL_COMMUNITY)
Admission: EM | Admit: 2016-02-12 | Discharge: 2016-03-03 | DRG: 291 | Disposition: E | Payer: Medicare Other | Attending: Pulmonary Disease | Admitting: Pulmonary Disease

## 2016-02-12 ENCOUNTER — Encounter (HOSPITAL_COMMUNITY): Payer: Self-pay

## 2016-02-12 ENCOUNTER — Emergency Department (HOSPITAL_COMMUNITY): Payer: Medicare Other

## 2016-02-12 DIAGNOSIS — Z7901 Long term (current) use of anticoagulants: Secondary | ICD-10-CM

## 2016-02-12 DIAGNOSIS — Z7951 Long term (current) use of inhaled steroids: Secondary | ICD-10-CM

## 2016-02-12 DIAGNOSIS — I509 Heart failure, unspecified: Secondary | ICD-10-CM

## 2016-02-12 DIAGNOSIS — R319 Hematuria, unspecified: Secondary | ICD-10-CM | POA: Diagnosis not present

## 2016-02-12 DIAGNOSIS — J9690 Respiratory failure, unspecified, unspecified whether with hypoxia or hypercapnia: Secondary | ICD-10-CM

## 2016-02-12 DIAGNOSIS — Z978 Presence of other specified devices: Secondary | ICD-10-CM

## 2016-02-12 DIAGNOSIS — Z953 Presence of xenogenic heart valve: Secondary | ICD-10-CM | POA: Diagnosis not present

## 2016-02-12 DIAGNOSIS — Z881 Allergy status to other antibiotic agents status: Secondary | ICD-10-CM

## 2016-02-12 DIAGNOSIS — R791 Abnormal coagulation profile: Secondary | ICD-10-CM

## 2016-02-12 DIAGNOSIS — J80 Acute respiratory distress syndrome: Secondary | ICD-10-CM | POA: Diagnosis not present

## 2016-02-12 DIAGNOSIS — I482 Chronic atrial fibrillation: Secondary | ICD-10-CM | POA: Diagnosis present

## 2016-02-12 DIAGNOSIS — T82838A Hemorrhage of vascular prosthetic devices, implants and grafts, initial encounter: Secondary | ICD-10-CM | POA: Diagnosis not present

## 2016-02-12 DIAGNOSIS — E876 Hypokalemia: Secondary | ICD-10-CM | POA: Diagnosis present

## 2016-02-12 DIAGNOSIS — I872 Venous insufficiency (chronic) (peripheral): Secondary | ICD-10-CM | POA: Diagnosis present

## 2016-02-12 DIAGNOSIS — Z9889 Other specified postprocedural states: Secondary | ICD-10-CM

## 2016-02-12 DIAGNOSIS — I272 Other secondary pulmonary hypertension: Secondary | ICD-10-CM | POA: Diagnosis present

## 2016-02-12 DIAGNOSIS — Z91030 Bee allergy status: Secondary | ICD-10-CM | POA: Diagnosis not present

## 2016-02-12 DIAGNOSIS — Z91040 Latex allergy status: Secondary | ICD-10-CM | POA: Diagnosis not present

## 2016-02-12 DIAGNOSIS — J811 Chronic pulmonary edema: Secondary | ICD-10-CM

## 2016-02-12 DIAGNOSIS — Z880 Allergy status to penicillin: Secondary | ICD-10-CM

## 2016-02-12 DIAGNOSIS — D696 Thrombocytopenia, unspecified: Secondary | ICD-10-CM | POA: Diagnosis present

## 2016-02-12 DIAGNOSIS — Z9109 Other allergy status, other than to drugs and biological substances: Secondary | ICD-10-CM

## 2016-02-12 DIAGNOSIS — R0602 Shortness of breath: Secondary | ICD-10-CM | POA: Diagnosis present

## 2016-02-12 DIAGNOSIS — M899 Disorder of bone, unspecified: Secondary | ICD-10-CM | POA: Diagnosis present

## 2016-02-12 DIAGNOSIS — J440 Chronic obstructive pulmonary disease with acute lower respiratory infection: Secondary | ICD-10-CM | POA: Diagnosis present

## 2016-02-12 DIAGNOSIS — J441 Chronic obstructive pulmonary disease with (acute) exacerbation: Secondary | ICD-10-CM | POA: Diagnosis not present

## 2016-02-12 DIAGNOSIS — J9600 Acute respiratory failure, unspecified whether with hypoxia or hypercapnia: Secondary | ICD-10-CM

## 2016-02-12 DIAGNOSIS — Z8249 Family history of ischemic heart disease and other diseases of the circulatory system: Secondary | ICD-10-CM

## 2016-02-12 DIAGNOSIS — J189 Pneumonia, unspecified organism: Secondary | ICD-10-CM | POA: Diagnosis not present

## 2016-02-12 DIAGNOSIS — N184 Chronic kidney disease, stage 4 (severe): Secondary | ICD-10-CM | POA: Diagnosis present

## 2016-02-12 DIAGNOSIS — D509 Iron deficiency anemia, unspecified: Secondary | ICD-10-CM | POA: Diagnosis present

## 2016-02-12 DIAGNOSIS — D649 Anemia, unspecified: Secondary | ICD-10-CM | POA: Diagnosis present

## 2016-02-12 DIAGNOSIS — I130 Hypertensive heart and chronic kidney disease with heart failure and stage 1 through stage 4 chronic kidney disease, or unspecified chronic kidney disease: Principal | ICD-10-CM | POA: Diagnosis present

## 2016-02-12 DIAGNOSIS — Z452 Encounter for adjustment and management of vascular access device: Secondary | ICD-10-CM

## 2016-02-12 DIAGNOSIS — Z6841 Body Mass Index (BMI) 40.0 and over, adult: Secondary | ICD-10-CM | POA: Diagnosis not present

## 2016-02-12 DIAGNOSIS — E873 Alkalosis: Secondary | ICD-10-CM | POA: Diagnosis not present

## 2016-02-12 DIAGNOSIS — Z4659 Encounter for fitting and adjustment of other gastrointestinal appliance and device: Secondary | ICD-10-CM

## 2016-02-12 DIAGNOSIS — Y95 Nosocomial condition: Secondary | ICD-10-CM | POA: Diagnosis present

## 2016-02-12 DIAGNOSIS — G9340 Encephalopathy, unspecified: Secondary | ICD-10-CM | POA: Diagnosis not present

## 2016-02-12 DIAGNOSIS — J9601 Acute respiratory failure with hypoxia: Secondary | ICD-10-CM | POA: Diagnosis not present

## 2016-02-12 DIAGNOSIS — Z79899 Other long term (current) drug therapy: Secondary | ICD-10-CM | POA: Diagnosis not present

## 2016-02-12 DIAGNOSIS — I4891 Unspecified atrial fibrillation: Secondary | ICD-10-CM | POA: Diagnosis present

## 2016-02-12 DIAGNOSIS — I5033 Acute on chronic diastolic (congestive) heart failure: Secondary | ICD-10-CM | POA: Diagnosis present

## 2016-02-12 DIAGNOSIS — R739 Hyperglycemia, unspecified: Secondary | ICD-10-CM | POA: Diagnosis present

## 2016-02-12 DIAGNOSIS — J810 Acute pulmonary edema: Secondary | ICD-10-CM | POA: Diagnosis not present

## 2016-02-12 DIAGNOSIS — R0682 Tachypnea, not elsewhere classified: Secondary | ICD-10-CM

## 2016-02-12 DIAGNOSIS — E43 Unspecified severe protein-calorie malnutrition: Secondary | ICD-10-CM | POA: Diagnosis present

## 2016-02-12 DIAGNOSIS — R0902 Hypoxemia: Secondary | ICD-10-CM

## 2016-02-12 DIAGNOSIS — I5023 Acute on chronic systolic (congestive) heart failure: Secondary | ICD-10-CM | POA: Diagnosis not present

## 2016-02-12 DIAGNOSIS — Z9289 Personal history of other medical treatment: Secondary | ICD-10-CM

## 2016-02-12 DIAGNOSIS — Z8679 Personal history of other diseases of the circulatory system: Secondary | ICD-10-CM

## 2016-02-12 DIAGNOSIS — Z515 Encounter for palliative care: Secondary | ICD-10-CM | POA: Diagnosis present

## 2016-02-12 DIAGNOSIS — Z954 Presence of other heart-valve replacement: Secondary | ICD-10-CM | POA: Diagnosis not present

## 2016-02-12 DIAGNOSIS — Z66 Do not resuscitate: Secondary | ICD-10-CM | POA: Diagnosis not present

## 2016-02-12 DIAGNOSIS — N179 Acute kidney failure, unspecified: Secondary | ICD-10-CM | POA: Diagnosis present

## 2016-02-12 DIAGNOSIS — D638 Anemia in other chronic diseases classified elsewhere: Secondary | ICD-10-CM | POA: Diagnosis present

## 2016-02-12 LAB — COMPREHENSIVE METABOLIC PANEL
ALBUMIN: 3.5 g/dL (ref 3.5–5.0)
ALK PHOS: 108 U/L (ref 38–126)
ALT: 14 U/L (ref 14–54)
ALT: 15 U/L (ref 14–54)
ANION GAP: 9 (ref 5–15)
ANION GAP: 11 (ref 5–15)
AST: 24 U/L (ref 15–41)
AST: 27 U/L (ref 15–41)
Albumin: 3.3 g/dL — ABNORMAL LOW (ref 3.5–5.0)
Alkaline Phosphatase: 108 U/L (ref 38–126)
BILIRUBIN TOTAL: 1.2 mg/dL (ref 0.3–1.2)
BILIRUBIN TOTAL: 1.2 mg/dL (ref 0.3–1.2)
BUN: 82 mg/dL — AB (ref 6–20)
BUN: 82 mg/dL — ABNORMAL HIGH (ref 6–20)
CALCIUM: 8.7 mg/dL — AB (ref 8.9–10.3)
CO2: 37 mmol/L — ABNORMAL HIGH (ref 22–32)
CO2: 35 mmol/L — ABNORMAL HIGH (ref 22–32)
Calcium: 8.9 mg/dL (ref 8.9–10.3)
Chloride: 90 mmol/L — ABNORMAL LOW (ref 101–111)
Chloride: 90 mmol/L — ABNORMAL LOW (ref 101–111)
Creatinine, Ser: 3.32 mg/dL — ABNORMAL HIGH (ref 0.44–1.00)
Creatinine, Ser: 3.29 mg/dL — ABNORMAL HIGH (ref 0.44–1.00)
GFR calc Af Amer: 15 mL/min — ABNORMAL LOW (ref >60)
GFR calc Af Amer: 15 mL/min — ABNORMAL LOW (ref >60)
GFR calc non Af Amer: 13 mL/min — ABNORMAL LOW (ref >60)
GFR, EST NON AFRICAN AMERICAN: 13 mL/min — AB (ref >60)
GLUCOSE: 115 mg/dL — AB (ref 65–99)
Glucose, Bld: 106 mg/dL — ABNORMAL HIGH (ref 65–99)
POTASSIUM: 3.7 mmol/L (ref 3.5–5.1)
POTASSIUM: 3.9 mmol/L (ref 3.5–5.1)
SODIUM: 136 mmol/L (ref 135–145)
Sodium: 136 mmol/L (ref 135–145)
TOTAL PROTEIN: 7 g/dL (ref 6.5–8.1)
TOTAL PROTEIN: 7.2 g/dL (ref 6.5–8.1)

## 2016-02-12 LAB — URINE MICROSCOPIC-ADD ON

## 2016-02-12 LAB — CBC WITH DIFFERENTIAL/PLATELET
Basophils Absolute: 0.1 10*3/uL (ref 0.0–0.1)
Basophils Relative: 1 %
EOS PCT: 6 %
Eosinophils Absolute: 0.4 10*3/uL (ref 0.0–0.7)
HEMATOCRIT: 18.8 % — AB (ref 36.0–46.0)
HEMOGLOBIN: 5.2 g/dL — AB (ref 12.0–15.0)
LYMPHS ABS: 0.9 10*3/uL (ref 0.7–4.0)
Lymphocytes Relative: 14 %
MCH: 20.5 pg — ABNORMAL LOW (ref 26.0–34.0)
MCHC: 27.7 g/dL — AB (ref 30.0–36.0)
MCV: 74 fL — AB (ref 78.0–100.0)
MONOS PCT: 10 %
Monocytes Absolute: 0.6 10*3/uL (ref 0.1–1.0)
NEUTROS ABS: 4.2 10*3/uL (ref 1.7–7.7)
Neutrophils Relative %: 69 %
Platelets: 209 10*3/uL (ref 150–400)
RBC: 2.54 MIL/uL — ABNORMAL LOW (ref 3.87–5.11)
RDW: 19 % — AB (ref 11.5–15.5)
WBC: 6.2 10*3/uL (ref 4.0–10.5)

## 2016-02-12 LAB — CBC
HCT: 19.9 % — ABNORMAL LOW (ref 36.0–46.0)
Hemoglobin: 5.4 g/dL — CL (ref 12.0–15.0)
MCH: 20 pg — AB (ref 26.0–34.0)
MCHC: 27.1 g/dL — ABNORMAL LOW (ref 30.0–36.0)
MCV: 73.7 fL — ABNORMAL LOW (ref 78.0–100.0)
PLATELETS: 258 10*3/uL (ref 150–400)
RBC: 2.7 MIL/uL — AB (ref 3.87–5.11)
RDW: 19.2 % — ABNORMAL HIGH (ref 11.5–15.5)
WBC: 8.3 10*3/uL (ref 4.0–10.5)

## 2016-02-12 LAB — PROTIME-INR
INR: 7.12 — AB (ref 0.00–1.49)
INR: 6.68 (ref 0.00–1.49)
PROTHROMBIN TIME: 55.9 s — AB (ref 11.6–15.2)
Prothrombin Time: 58.6 seconds — ABNORMAL HIGH (ref 11.6–15.2)

## 2016-02-12 LAB — URINALYSIS, ROUTINE W REFLEX MICROSCOPIC
Bilirubin Urine: NEGATIVE
GLUCOSE, UA: NEGATIVE mg/dL
Hgb urine dipstick: NEGATIVE
Ketones, ur: NEGATIVE mg/dL
NITRITE: NEGATIVE
PROTEIN: NEGATIVE mg/dL
Specific Gravity, Urine: <1.005 — ABNORMAL LOW (ref 1.005–1.030)
pH: 7 (ref 5.0–8.0)

## 2016-02-12 LAB — ABO/RH: ABO/RH(D): A NEG

## 2016-02-12 LAB — POC OCCULT BLOOD, ED: FECAL OCCULT BLD: NEGATIVE

## 2016-02-12 LAB — PREPARE RBC (CROSSMATCH)

## 2016-02-12 LAB — BRAIN NATRIURETIC PEPTIDE: B Natriuretic Peptide: 574 pg/mL — ABNORMAL HIGH (ref 0.0–100.0)

## 2016-02-12 LAB — TROPONIN I: TROPONIN I: 0.03 ng/mL (ref <0.031)

## 2016-02-12 LAB — MRSA PCR SCREENING: MRSA BY PCR: POSITIVE — AB

## 2016-02-12 MED ORDER — TRAMADOL HCL 50 MG PO TABS
50.0000 mg | ORAL_TABLET | Freq: Four times a day (QID) | ORAL | Status: DC | PRN
  Administered 2016-02-12 – 2016-02-16 (×6): 50 mg via ORAL
  Filled 2016-02-12 (×6): qty 1

## 2016-02-12 MED ORDER — BUDESONIDE 0.25 MG/2ML IN SUSP: 0.2500 mg | Freq: Two times a day (BID) | RESPIRATORY_TRACT | Status: DC

## 2016-02-12 MED ORDER — CO Q-10 100 MG PO CAPS: ORAL_CAPSULE | Freq: Every day | ORAL | Status: DC

## 2016-02-12 MED ORDER — SODIUM CHLORIDE 0.9 % IV SOLN
Freq: Once | INTRAVENOUS | Status: AC
  Administered 2016-02-12: 11:00:00 via INTRAVENOUS

## 2016-02-12 MED ORDER — DABIGATRAN ETEXILATE MESYLATE 75 MG PO CAPS
75.0000 mg | ORAL_CAPSULE | Freq: Two times a day (BID) | ORAL | Status: DC
  Administered 2016-02-12 – 2016-02-15 (×7): 75 mg via ORAL
  Filled 2016-02-12 (×12): qty 1

## 2016-02-12 MED ORDER — FUROSEMIDE 10 MG/ML IJ SOLN
80.0000 mg | Freq: Once | INTRAMUSCULAR | Status: AC
  Administered 2016-02-12: 80 mg via INTRAVENOUS
  Filled 2016-02-12: qty 8

## 2016-02-12 MED ORDER — ALBUTEROL SULFATE (2.5 MG/3ML) 0.083% IN NEBU
2.5000 mg | INHALATION_SOLUTION | Freq: Four times a day (QID) | RESPIRATORY_TRACT | Status: DC | PRN
  Administered 2016-02-13: 2.5 mg via RESPIRATORY_TRACT
  Filled 2016-02-12 (×2): qty 3

## 2016-02-12 MED ORDER — BUDESONIDE 0.5 MG/2ML IN SUSP
0.5000 mg | Freq: Two times a day (BID) | RESPIRATORY_TRACT | Status: DC
  Administered 2016-02-12 – 2016-02-16 (×10): 0.5 mg via RESPIRATORY_TRACT
  Filled 2016-02-12 (×10): qty 2

## 2016-02-12 MED ORDER — FUROSEMIDE 10 MG/ML IJ SOLN
40.0000 mg | Freq: Two times a day (BID) | INTRAMUSCULAR | Status: DC
  Administered 2016-02-12 – 2016-02-13 (×2): 40 mg via INTRAVENOUS
  Filled 2016-02-12 (×2): qty 4

## 2016-02-12 MED ORDER — METOLAZONE 2.5 MG PO TABS
2.5000 mg | ORAL_TABLET | Freq: Every day | ORAL | Status: DC
  Administered 2016-02-12: 2.5 mg via ORAL
  Filled 2016-02-12 (×4): qty 1

## 2016-02-12 MED ORDER — DOCUSATE SODIUM 100 MG PO CAPS: 100.0000 mg | ORAL_CAPSULE | Freq: Every day | ORAL | Status: DC | PRN

## 2016-02-12 MED ORDER — CETYLPYRIDINIUM CHLORIDE 0.05 % MT LIQD
7.0000 mL | Freq: Two times a day (BID) | OROMUCOSAL | Status: DC
  Administered 2016-02-12 – 2016-02-16 (×8): 7 mL via OROMUCOSAL

## 2016-02-12 MED ORDER — VITAMIN K1 10 MG/ML IJ SOLN
10.0000 mg | Freq: Once | INTRAMUSCULAR | Status: AC
  Administered 2016-02-12: 10 mg via SUBCUTANEOUS
  Filled 2016-02-12: qty 1

## 2016-02-12 MED ORDER — MUPIROCIN 2 % EX OINT
1.0000 "application " | TOPICAL_OINTMENT | Freq: Two times a day (BID) | CUTANEOUS | Status: AC
  Administered 2016-02-12 – 2016-02-17 (×10): 1 via NASAL
  Filled 2016-02-12 (×3): qty 22

## 2016-02-12 MED ORDER — DILTIAZEM HCL ER COATED BEADS 180 MG PO CP24
180.0000 mg | ORAL_CAPSULE | Freq: Every day | ORAL | Status: DC
  Administered 2016-02-12 – 2016-02-15 (×4): 180 mg via ORAL
  Filled 2016-02-12 (×5): qty 1

## 2016-02-12 MED ORDER — CHLORHEXIDINE GLUCONATE CLOTH 2 % EX PADS
6.0000 | MEDICATED_PAD | Freq: Every day | CUTANEOUS | Status: AC
  Administered 2016-02-13 – 2016-02-17 (×4): 6 via TOPICAL

## 2016-02-12 MED ORDER — SODIUM CHLORIDE 0.9% FLUSH
3.0000 mL | Freq: Two times a day (BID) | INTRAVENOUS | Status: DC
  Administered 2016-02-12 – 2016-02-22 (×17): 3 mL via INTRAVENOUS
  Administered 2016-02-23: 10:00:00 via INTRAVENOUS
  Administered 2016-02-24 – 2016-02-27 (×7): 3 mL via INTRAVENOUS

## 2016-02-12 MED ORDER — ALBUTEROL SULFATE (2.5 MG/3ML) 0.083% IN NEBU
2.5000 mg | INHALATION_SOLUTION | RESPIRATORY_TRACT | Status: DC | PRN
  Administered 2016-02-13 – 2016-02-15 (×6): 2.5 mg via RESPIRATORY_TRACT
  Filled 2016-02-12 (×6): qty 3

## 2016-02-12 NOTE — ED Notes (Signed)
CRITICAL VALUE ALERT  Critical value received:  Hemoglobin 5.4  Date of notification:  02/28/2016  Time of notification:  0948  Critical value read back:Yes.    Nurse who received alert:  [facility] RN  MD notified (1st page):  Dr. Clayborne DanaMesner  Time of first page:  [phone]  MD notified (2nd page):  Time of second page:  Responding MD:  Dr. Clayborne DanaMesner  Time MD responded:  [phone]

## 2016-02-12 NOTE — ED Notes (Signed)
CRITICAL VALUE ALERT  Critical value received:  INR 6.68  Date of notification:  Jan 10, 2016  Time of notification:  0956  Critical value read back:Yes.    Nurse who received alert:  Viviano SimasLauren Gurley Climer, RN  MD notified (1st page):  Mesner

## 2016-02-12 NOTE — ED Notes (Signed)
CRITICAL VALUE ALERT  Critical value received:  Hemoglobin/ 5.2  Date of notification:  02/26/2016  Time of notification:  0849  Critical value read back:Yes.    Nurse who received alert:  Dorris Fetchaphyne Anderson, RN  MD notified (1st page):  Dr Clayborne DanaMesner  Time of first page:  [phone]

## 2016-02-12 NOTE — ED Provider Notes (Signed)
CSN: [id number]     Arrival date & time 02/23/2016  4098 History   First MD Initiated Contact with Patient 02/05/2016 [phone]     Chief Complaint  Patient presents with  . Shortness of Breath     (Consider location/radiation/quality/duration/timing/severity/associated sxs/prior Treatment) Patient is a 75 y.o. female presenting with shortness of breath.  Shortness of Breath Severity:  Mild Onset quality:  Gradual Duration:  5 days Timing:  Constant Progression:  Worsening Chronicity:  Recurrent Context: not activity and not strong odors   Relieved by:  None tried Worsened by:  Nothing tried Ineffective treatments:  Inhaler Associated symptoms: cough   Associated symptoms: no abdominal pain, no chest pain, no fever, no neck pain and no sore throat     Past Medical History  Diagnosis Date  . Arthritis   . Chronic atrial fibrillation (HCC)     a. previously on Pradaxa - on Coumadin since valve surgery.  . Aortic stenosis     a. Severe - s/p pericardial AVR with MAZE 02/04/15.  Marland Kitchen Pneumonia 09/2014  . History of bronchitis 2015  . Family history of adverse reaction to anesthesia     Uncle with MH in the 60's,cousin in the 46's with MH  . 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 COPD (chronic obstructive pulmonary disease) (HCC)   . Normal coronary arteries     a. By cath 11/2014.  . Pulmonary hypertension (HCC)     a. Mod by cath 11/2014.  Marland Kitchen Chronic diastolic CHF (congestive heart failure) (HCC)   . Vocal cord paralysis     a. Dx post-AVR in 01/2015. Required PANDA, intubation during that admission.  . Pleural effusion   . AKI (acute kidney injury)  (HCC)     a. H/o AKI after AVR in 01/2015.  . Tracheomalacia   . Anemia     a. 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   Past Surgical History  Procedure Laterality Date  . None    . Left and right heart catheterization with coronary angiogram N/A 11/01/2014    Procedure: LEFT AND RIGHT HEART CATHETERIZATION WITH CORONARY ANGIOGRAM;  Surgeon: [name], MD;  Location: [facility] CATH LAB;  Service: Cardiovascular;  Laterality: N/A;  . Aortic valve replacement N/A 01/19/2015    Procedure: AORTIC VALVE REPLACEMENT (AVR);  Surgeon: Purcell Nails, MD;  Location: [facility] OR;  Service: Open Heart Surgery;  Laterality: N/A;  . Maze N/A 01/19/2015    Procedure: MAZE;  Surgeon: Purcell Nails, MD;  Location: [facility] OR;  Service: Open Heart Surgery;  Laterality: N/A;  . Aortic root enlargement N/A 01/19/2015    Procedure:  AORTIC ROOT ENLARGEMENT;  Surgeon: Purcell Nails, MD;  Location: [facility] OR;  Service: Open Heart Surgery;  Laterality: N/A;  . Tee without cardioversion N/A 01/19/2015    Procedure: TRANSESOPHAGEAL ECHOCARDIOGRAM (TEE);  Surgeon: Purcell Nails, MD;  Location: [facility] OR;  Service: Open Heart Surgery;  Laterality: N/A;   Family History  Problem Relation Age of Onset  . Breast cancer Mother   . Congestive Heart Failure Mother   . Cancer Father     ? Lymphoma  . Malignant hyperthermia Cousin  Social History  Substance Use Topics  . Smoking status: Never Smoker   . Smokeless tobacco: Never Used  . Alcohol Use: No   OB History    No data available     Review of Systems  Constitutional: Negative for fever and chills.  HENT: Negative for sore throat.   Respiratory: Positive for cough and shortness of breath.   Cardiovascular: Negative for chest pain.  Gastrointestinal: Negative for abdominal pain.  Endocrine: Negative for polydipsia and polyuria.  Musculoskeletal: Negative for back pain and neck pain.  All other systems reviewed and are negative.     Allergies  Bee venom;  Doxycycline; Latex; and Penicillins  Home Medications   Prior to Admission medications   Medication Sig Start Date End Date Taking? Authorizing Provider  albuterol (PROAIR HFA) 108 (90 BASE) MCG/ACT inhaler Inhale 2 puffs into the lungs 4 (four) times daily as needed for wheezing or shortness of breath.   Yes Historical Provider, MD  albuterol (PROVENTIL) (2.5 MG/3ML) 0.083% nebulizer solution Take 3 mLs (2.5 mg total) by nebulization every 4 (four) hours as needed for wheezing or shortness of breath. Patient taking differently: Take 2.5 mg by nebulization 4 (four) times daily.  09/15/14  Yes Erick Blinks, MD  budesonide (PULMICORT) 0.25 MG/2ML nebulizer solution Take 2 mLs (0.25 mg total) by nebulization 2 (two) times daily. 04/07/15  Yes Lupita Leash, MD  CARTIA XT 180 MG 24 hr capsule Take 180 mg by mouth daily.  05/30/15  Yes Historical Provider, MD  Coenzyme Q10 (CO Q-10) 100 MG CAPS Take by mouth daily.   Yes Historical Provider, MD  docusate sodium (COLACE) 100 MG capsule Take 1 capsule (100 mg total) by mouth daily as needed for mild constipation. 04/28/15  Yes Jodelle Gross, NP  FLOVENT DISKUS 250 MCG/BLIST AEPB INHALE ONE PUFF BY MOUTH TWICE DAILY 08/08/15  Yes Lupita Leash, MD  magnesium oxide (MAG-OX) 400 MG tablet Take 400 mg by mouth 2 (two) times daily.   Yes Historical Provider, MD  metolazone (ZAROXOLYN) 2.5 MG tablet TAKE ONE TABLET BY MOUTH ONCE DAILY 11/02/15  Yes [name], MD  OXYGEN Inhale into the lungs. 2.5 lpm with exertion   Yes Historical Provider, MD  Potassium Chloride ER 20 MEQ TBCR Take 2 tablets (40 meq) daily 10/20/15  Yes [name], MD  PRADAXA 150 MG CAPS capsule TAKE ONE CAPSULE BY MOUTH TWICE DAILY 01/02/16  Yes [name], MD  torsemide (DEMADEX) 20 MG tablet Take 2 tablets (40 mg total) by mouth 2 (two) times daily. 10/19/15  Yes [name], MD  traMADol (ULTRAM) 50 MG tablet Take 1 tablet (50 mg total) by mouth every 6  (six) hours as needed (pain). 02/21/15  Yes Wayne E Gold, PA-C   BP 109/49 mmHg  Pulse 75  Temp(Src) 97.6 F (36.4 C) (Oral)  Resp 29  Ht 5\' 2"  (1.575 m)  Wt 248 lb 7.3 oz (112.7 kg)  BMI 45.43 kg/m2  SpO2 97% Physical Exam  Constitutional: She is oriented to person, place, and time. She appears well-developed and well-nourished.  HENT:  Head: Normocephalic and atraumatic.  Neck: Normal range of motion.  Cardiovascular: Normal rate and regular rhythm.   Pulmonary/Chest: No stridor. Tachypnea noted. No respiratory distress. She has rales.  Abdominal: Soft. She exhibits no distension.  Musculoskeletal: She exhibits edema.  Neurological: She is alert and oriented to person, place, and time.  Skin: Skin is warm and dry.  Nursing note and vitals reviewed.   ED Course  Procedures (including critical care time)  CRITICAL CARE Performed by: Marily MemosMesner, Jeremaine Maraj Total critical care time: 35 minutes Critical care time was exclusive of separately billable procedures and treating other patients. Critical care was necessary to treat or prevent imminent or life-threatening deterioration. Critical care was time spent personally by me on the following activities: development of treatment plan with patient and/or surrogate as well as nursing, discussions with consultants, evaluation of patient's response to treatment, examination of patient, obtaining history from patient or surrogate, ordering and performing treatments and interventions, ordering and review of laboratory studies, ordering and review of radiographic studies, pulse oximetry and re-evaluation of patient's condition.   Labs Review Labs Reviewed  MRSA PCR SCREENING - Abnormal; Notable for the following:    MRSA by PCR POSITIVE (*)    All other components within normal limits  CBC WITH DIFFERENTIAL/PLATELET - Abnormal; Notable for the following:    RBC 2.54 (*)    Hemoglobin 5.2 (*)    HCT 18.8 (*)    MCV 74.0 (*)    MCH 20.5 (*)     MCHC 27.7 (*)    RDW 19.0 (*)    All other components within normal limits  COMPREHENSIVE METABOLIC PANEL - Abnormal; Notable for the following:    Chloride 90 (*)    CO2 37 (*)    Glucose, Bld 106 (*)    BUN 82 (*)    Creatinine, Ser 3.32 (*)    Calcium 8.7 (*)    Albumin 3.3 (*)    GFR calc non Af Amer 13 (*)    GFR calc Af Amer 15 (*)    All other components within normal limits  BRAIN NATRIURETIC PEPTIDE - Abnormal; Notable for the following:    B Natriuretic Peptide 574.0 (*)    All other components within normal limits  PROTIME-INR - Abnormal; Notable for the following:    Prothrombin Time 58.6 (*)    INR 7.12 (*)    All other components within normal limits  CBC - Abnormal; Notable for the following:    RBC 2.70 (*)    Hemoglobin 5.4 (*)    HCT 19.9 (*)    MCV 73.7 (*)    MCH 20.0 (*)    MCHC 27.1 (*)    RDW 19.2 (*)    All other components within normal limits  PROTIME-INR - Abnormal; Notable for the following:    Prothrombin Time 55.9 (*)    INR 6.68 (*)    All other components within normal limits  COMPREHENSIVE METABOLIC PANEL - Abnormal; Notable for the following:    Chloride 90 (*)    CO2 35 (*)    Glucose, Bld 115 (*)    BUN 82 (*)    Creatinine, Ser 3.29 (*)    GFR calc non Af Amer 13 (*)    GFR calc Af Amer 15 (*)    All other components within normal limits  URINALYSIS, ROUTINE W REFLEX MICROSCOPIC (NOT AT [facility]) - Abnormal; Notable for the following:    Specific Gravity, Urine <1.005 (*)    Leukocytes, UA SMALL (*)    All other components within normal limits  URINE MICROSCOPIC-ADD ON - Abnormal; Notable for the following:    Squamous Epithelial / LPF 0-5 (*)    Bacteria, UA FEW (*)    All other components within normal limits  TROPONIN I  OCCULT BLOOD X 1 CARD TO LAB, STOOL  POC OCCULT BLOOD, ED  TYPE AND SCREEN  PREPARE RBC (CROSSMATCH)  ABO/RH    Imaging Review Dg Chest 2 View  02/21/2016  CLINICAL DATA:  SOB this AM. Non-smoker. A-fib,  heart valve replacement, lung surgery. EXAM: CHEST  2 VIEW COMPARISON:  10/12/2015 FINDINGS: Lateral view degraded by patient arm position. Bilateral glenohumeral joint osteoarthritis. Prior median sternotomy. Midline trachea. Moderate cardiomegaly. Bilateral pleural effusions, larger on the right. Similar. No pneumothorax. Interstitial edema is moderate and increased. Bibasilar airspace disease is increased. IMPRESSION: Moderate congestive heart failure, increased. Bilateral pleural effusions with increased airspace disease which is most likely atelectasis. Electronically Signed   By: Jeronimo Greaves M.D.   On: 02/05/2016 08:01   I have personally reviewed and evaluated these images and lab results as part of my medical decision-making.   EKG Interpretation   Date/Time:  Sunday February 12 2016 07:53:28 EDT Ventricular Rate:  77 PR Interval:    QRS Duration: 95 QT Interval:  400 QTC Calculation: 453 R Axis:   167 Text Interpretation:  Right and left arm electrode reversal,  interpretation assumes no reversal Atrial fibrillation Ventricular  premature complex Probable anterolateral infarct, age indeterm Baseline  wander in lead(s) I III aVL Confirmed by Alistair Senft MD, Barbara Cower [phone]) on  02/05/2016 9:18:38 AM      MDM   Final diagnoses:  Anemia, unspecified anemia type  Acute on chronic congestive heart failure, unspecified congestive heart failure type (HCC)  AKI (acute kidney injury) (HCC)  Supratherapeutic INR   Likely chf exacerbation. Will evaluate appropriately.   cxr with e/o fluid overload on my read, no obvious consolidation. Will order lasix.   Lab called with Hb of 5.2 and acute on chronic kidney disease. INR elevated as well. Repeat labs same. Type and screen done, transfusion ordered. Also with likely fluid overload so lasix at same time.   Discussed with medicine who will admit to stepdown.     Marily Memos, MD 02/26/2016 [phone]

## 2016-02-12 NOTE — ED Notes (Signed)
This is third EKG done.  Monitor in ED room 6 not working, portable EKG done.  Dr Clayborne DanaMesner in, says 2 nd EKG did not cross over.  Third EKG done and given to MD.

## 2016-02-12 NOTE — H&P (Signed)
[facility] History and Physical  Sandra Turner ZOX:[id number] DOB: 02/11/1941    PCP:   [name], MD   Chief Complaint: SOB>   HPI: Sandra Turner is an 75 y.o. female with hx of CKD3, s/p pericardial AVR, afib on Pradaxa, pulm HTN,  COPD, diastolic CHF, hx of anemia, presented to the ER as she was having more DOE.  She has some coughs, but non productive, and denied fever, chills, or chest pain.  Evaluation in the ER showed CXR with vascular congestion, and Hb of 5.4 g per dL, microcytic, with OB negative stool, and no melana or rectal bleeding.  She has elevated Cr to 3 with normal K.  She was ordered 2 units of PRBCs, given IV Lasix 60mg  x 1 in the ER, and hospitalist was asked to admit her for AKI, pulmonary edema, in the setting of severe anemia.  Her vitals were stable, she was alert, orient, and converses meaningfully.    Rewiew of Systems Constitutional: Negative for malaise, fever and chills. No significant weight loss or weight gain Eyes: Negative for eye pain, redness and discharge, diplopia, visual changes, or flashes of light. ENMT: Negative for ear pain, hoarseness, nasal congestion, sinus pressure and sore throat. No headaches; tinnitus, drooling, or problem swallowing. Cardiovascular: Negative for chest pain, palpitations, diaphoresis, dyspnea and peripheral edema. ; No orthopnea, PND Respiratory: Negative for cough, hemoptysis, wheezing and stridor. No pleuritic chestpain. Gastrointestinal: Negative for nausea, vomiting, diarrhea, constipation, abdominal pain, melena, blood in stool, hematemesis, jaundice and rectal bleeding.   Genitourinary: Negative for frequency, dysuria, incontinence,flank pain and hematuria; Musculoskeletal: Negative for back pain and neck pain. Negative for swelling and trauma.;  Skin: . Negative for pruritus, rash, abrasions, bruising and skin lesion.; ulcerations Neuro: Negative for headache, lightheadedness and neck stiffness. Negative for  weakness, altered level of consciousness , altered mental status, extremity weakness, burning feet, involuntary movement, seizure and syncope.  Psych: negative for anxiety, depression, insomnia, tearfulness, panic attacks, hallucinations, paranoia, suicidal or homicidal ideation    Past Medical History  Diagnosis Date  . Arthritis   . Chronic atrial fibrillation (HCC)     a. previously on Pradaxa - on Coumadin since valve surgery.  . Aortic stenosis     a. Severe - s/p pericardial AVR with MAZE 02/04/15.  Marland Kitchen Pneumonia 09/2014  . History of bronchitis 2015  . Family history of adverse reaction to anesthesia     Uncle with MH in the 60's,cousin in the 70's with MH  . 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 COPD (chronic obstructive pulmonary disease) (HCC)   . Normal coronary arteries     a. By cath 11/2014.  . Pulmonary hypertension (HCC)     a. Mod by cath 11/2014.  Marland Kitchen Chronic diastolic CHF (congestive heart failure) (HCC)   . Vocal cord paralysis     a. Dx post-AVR in 01/2015. Required PANDA, intubation during that admission.  . Pleural effusion   . AKI (acute kidney injury) (HCC)     a. H/o AKI after AVR in 01/2015.  . Tracheomalacia   . Anemia     a. 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    Past  Surgical History  Procedure Laterality Date  . None    . Left and right heart catheterization with coronary angiogram N/A 11/01/2014    Procedure: LEFT AND RIGHT HEART CATHETERIZATION WITH CORONARY ANGIOGRAM;  Surgeon: Kathleene Hazelhristopher D McAlhany, MD;  [facility];  Service: Cardiovascular;  Laterality: N/A;  . Aortic valve replacement N/A 01/19/2015     Procedure: AORTIC VALVE REPLACEMENT (AVR);  Surgeon: Purcell Nailslarence H Owen, MD;  [facility];  Service: Open Heart Surgery;  Laterality: N/A;  . Maze N/A 01/19/2015    Procedure: MAZE;  Surgeon: Purcell Nailslarence H Owen, MD;  [facility];  Service: Open Heart Surgery;  Laterality: N/A;  . Aortic root enlargement N/A 01/19/2015    Procedure:  AORTIC ROOT ENLARGEMENT;  Surgeon: Purcell Nailslarence H Owen, MD;  [facility];  Service: Open Heart Surgery;  Laterality: N/A;  . Tee without cardioversion N/A 01/19/2015    Procedure: TRANSESOPHAGEAL ECHOCARDIOGRAM (TEE);  Surgeon: Purcell Nailslarence H Owen, MD;  [facility];  Service: Open Heart Surgery;  Laterality: N/A;    Medications:  HOME MEDS: Prior to Admission medications   Medication Sig Start Date End Date Taking? Authorizing Provider  albuterol (PROAIR HFA) 108 (90 BASE) MCG/ACT inhaler Inhale 2 puffs into the lungs 4 (four) times daily as needed for wheezing or shortness of breath.    Historical Provider, MD  albuterol (PROVENTIL) (2.5 MG/3ML) 0.083% nebulizer solution Take 3 mLs (2.5 mg total) by nebulization every 4 (four) hours as needed for wheezing or shortness of breath. Patient taking differently: Take 2.5 mg by nebulization 4 (four) times daily.  09/15/14   Erick BlinksJehanzeb Memon, MD  budesonide (PULMICORT) 0.25 MG/2ML nebulizer solution Take 2 mLs (0.25 mg total) by nebulization 2 (two) times daily. 04/07/15   Lupita Leashouglas B McQuaid, MD  CARTIA XT 180 MG 24 hr capsule Take 180 mg by mouth daily.  05/30/15   Historical Provider, MD  Coenzyme Q10 (CO Q-10) 100 MG CAPS Take by mouth daily.    Historical Provider, MD  docusate sodium (COLACE) 100 MG capsule Take 1 capsule (100 mg total) by mouth daily as needed for mild constipation. 04/28/15   Jodelle GrossKathryn M Lawrence, NP  FLOVENT DISKUS 250 MCG/BLIST AEPB INHALE ONE PUFF BY MOUTH TWICE DAILY 08/08/15   Lupita Leashouglas B McQuaid, MD  magnesium oxide (MAG-OX) 400 MG tablet Take 400 mg by mouth 2 (two) times daily.    Historical Provider, MD   metolazone (ZAROXOLYN) 2.5 MG tablet TAKE ONE TABLET BY MOUTH ONCE DAILY 11/02/15   Jonelle SidleSamuel G McDowell, MD  OXYGEN Inhale into the lungs. 2.5 lpm with exertion    Historical Provider, MD  Potassium Chloride ER 20 MEQ TBCR Take 2 tablets (40 meq) daily 10/20/15   Jonelle SidleSamuel G McDowell, MD  PRADAXA 150 MG CAPS capsule TAKE ONE CAPSULE BY MOUTH TWICE DAILY 01/02/16   Jonelle SidleSamuel G McDowell, MD  torsemide (DEMADEX) 20 MG tablet Take 2 tablets (40 mg total) by mouth 2 (two) times daily. 10/19/15   Jonelle SidleSamuel G McDowell, MD  traMADol (ULTRAM) 50 MG tablet Take 1 tablet (50 mg total) by mouth every 6 (six) hours as needed (pain). 02/21/15   Rowe ClackWayne E Gold, PA-C     Allergies:  Allergies  Allergen Reactions  . Bee Venom Swelling  . Doxycycline     Hoarseness / throat irritation  . Latex Rash  . Penicillins Rash    Social History:   reports that she has never smoked. She has never used smokeless tobacco. She  reports that she does not drink alcohol or use illicit drugs.  Family History: Family History  Problem Relation Age of Onset  . Breast cancer Mother   . Congestive Heart Failure Mother   . Cancer Father     ? Lymphoma  . Malignant hyperthermia Cousin      Physical Exam: Filed Vitals:   02/18/2016 0815 02/09/2016 0828 02/26/2016 0945 02/10/2016 1047  BP:  129/57 136/55 117/44  Pulse: 70 72 90 76  Temp:    97.5 F (36.4 C)  TempSrc:    Oral  Resp: Height:      Weight:      SpO2: 94% 97% 99% 100%   Blood pressure 117/44, pulse 76, temperature 97.5 F (36.4 C), temperature source Oral, resp. rate 16, height  (1.575 m), weight 99.791 kg (220 lb), SpO2 100 %.  GEN:  Pleasant patient lying in the stretcher in no acute distress; cooperative with exam. PSYCH:  alert and oriented x4; does not appear anxious or depressed; affect is appropriate. HEENT: Mucous membranes pink and anicteric; PERRLA; EOM intact; no cervical lymphadenopathy nor thyromegaly or carotid bruit; no JVD; There were no  stridor. Neck is very supple. Breasts:: Not examined CHEST WALL: No tenderness CHEST: Normal respiration, clear to auscultation bilaterally.  HEART: Regular rate and rhythm.  There are no murmur, rub, or gallops.   BACK: No kyphosis or scoliosis; no CVA tenderness ABDOMEN: soft and non-tender; no masses, no organomegaly, normal abdominal bowel sounds; no pannus; no intertriginous candida. There is no rebound and no distention. Rectal Exam: Not done EXTREMITIES: No bone or joint deformity; age-appropriate arthropathy of the hands and knees; no edema; no ulcerations.  There is no calf tenderness. Genitalia: not examined PULSES: 2+ and symmetric SKIN: Normal hydration no rash or ulceration CNS: Cranial nerves 2-12 grossly intact no focal lateralizing neurologic deficit.  Speech is fluent; uvula elevated with phonation, facial symmetry and tongue midline. DTR are normal bilaterally, cerebella exam is intact, barbinski is negative and strengths are equaled bilaterally.  No sensory loss.   Labs on Admission:  Basic Metabolic Panel:  Recent Labs Lab 02/03/2016 0755 02/02/2016 0915  NA 136 136  K 3.7 3.9  CL 90* 90*  CO2 37* 35*  GLUCOSE 106* 115*  BUN 82* 82*  CREATININE 3.32* 3.29*  CALCIUM 8.7* 8.9   Liver Function Tests:  Recent Labs Lab 02/23/2016 0755 02/22/2016 0915  AST 24 27  ALT 14 15  ALKPHOS 108 108  BILITOT 1.2 1.2  PROT 7.0 7.2  ALBUMIN 3.3* 3.5   Cardiac Enzymes:  Recent Labs Lab 02/09/2016 0755  TROPONINI 0.03    Radiological Exams on Admission: Dg Chest 2 View  02/02/2016  CLINICAL DATA:  SOB this AM. Non-smoker. A-fib, heart valve replacement, lung surgery. EXAM: CHEST  2 VIEW COMPARISON:  10/12/2015 FINDINGS: Lateral view degraded by patient arm position. Bilateral glenohumeral joint osteoarthritis. Prior median sternotomy. Midline trachea. Moderate cardiomegaly. Bilateral pleural effusions, larger on the right. Similar. No pneumothorax. Interstitial edema is  moderate and increased. Bibasilar airspace disease is increased. IMPRESSION: Moderate congestive heart failure, increased. Bilateral pleural effusions with increased airspace disease which is most likely atelectasis. Electronically Signed   By: Jeronimo Greaves M.D.   On: 02/04/2016 08:01    EKG: Independently reviewed.   Assessment/Plan Present on Admission:  . AKI (acute kidney injury) (HCC) . Acute on chronic diastolic heart failure (HCC) . Atrial fibrillation (HCC) . Chronic anemia . CKD (  chronic kidney disease), stage III . Shortness of breath . Anemia  PLAN:  Anemia:  I suspect she has ACD, and/or anemia of CKD.  Will give her 2 units of PRBCs with IV lasix given her pulmonary edema.  She may need EPOGEN or Procrit.   AKI on CKD:  Will consult nephrology for further recommendation.  CHF:  WIll give her IV Lasix.  This is especially important with upcoming PRBCs x 2.  Will admit to SDU.  Afib:  Continue with Pradaxa.  Her INR is elevated, which would be expected with Pradaxa.  However,  Will need dose adjustment for renal failure.  Will give Vit K in case she has vit K deficient causing more elevated INR.    Other plans as per orders. Code Status: FULL CODE.  Reconfirmed today.    Houston Siren, MD. FACP [facility] Pager [phone] 7pm to 7am.  02/05/2016, 11:09 AM

## 2016-02-12 NOTE — ED Notes (Signed)
EMS reports pt c/o SOB.  Pt has copd and is on home 02.  EMS reports o2 sat was 91% on 2 liters.  They increased 02 to 4 liters and pt used her rescue inhaler and sat increased to 95%.  PT denies pain but reports generalized weakness.

## 2016-02-12 NOTE — ED Notes (Signed)
CRITICAL VALUE ALERT  Critical value received:  INR/ 7.12  Date of notification:  02/29/2016  Time of notification:  0854  Critical value read back:Yes.    Nurse who received alert:  Dorris Fetchaphyne Anderson, RN  MD notified (1st page):  Dr Clayborne DanaMesner  Time of first page:  [phone]

## 2016-02-12 NOTE — Progress Notes (Signed)

## 2016-02-13 LAB — TYPE AND SCREEN
ABO/RH(D): A NEG
ANTIBODY SCREEN: NEGATIVE
Unit division: 0
Unit division: 0

## 2016-02-13 LAB — CBC
HCT: 23.8 % — ABNORMAL LOW (ref 36.0–46.0)
HCT: 24.4 % — ABNORMAL LOW (ref 36.0–46.0)
HEMOGLOBIN: 7 g/dL — AB (ref 12.0–15.0)
HEMOGLOBIN: 7.2 g/dL — AB (ref 12.0–15.0)
MCH: 22.4 pg — AB (ref 26.0–34.0)
MCH: 22.5 pg — AB (ref 26.0–34.0)
MCHC: 29.4 g/dL — ABNORMAL LOW (ref 30.0–36.0)
MCHC: 29.5 g/dL — ABNORMAL LOW (ref 30.0–36.0)
MCV: 76 fL — AB (ref 78.0–100.0)
MCV: 76.3 fL — AB (ref 78.0–100.0)
PLATELETS: 214 10*3/uL (ref 150–400)
Platelets: 187 10*3/uL (ref 150–400)
RBC: 3.13 MIL/uL — AB (ref 3.87–5.11)
RBC: 3.2 MIL/uL — AB (ref 3.87–5.11)
RDW: 18.6 % — ABNORMAL HIGH (ref 11.5–15.5)
RDW: 18.5 % — ABNORMAL HIGH (ref 11.5–15.5)
WBC: 7.4 10*3/uL (ref 4.0–10.5)
WBC: 7.3 10*3/uL (ref 4.0–10.5)

## 2016-02-13 LAB — COMPREHENSIVE METABOLIC PANEL
ALBUMIN: 3.2 g/dL — AB (ref 3.5–5.0)
ALT: 13 U/L — AB (ref 14–54)
AST: 28 U/L (ref 15–41)
Alkaline Phosphatase: 99 U/L (ref 38–126)
Anion gap: 10 (ref 5–15)
BUN: 81 mg/dL — ABNORMAL HIGH (ref 6–20)
CHLORIDE: 90 mmol/L — AB (ref 101–111)
CO2: 37 mmol/L — AB (ref 22–32)
CREATININE: 3.18 mg/dL — AB (ref 0.44–1.00)
Calcium: 8.6 mg/dL — ABNORMAL LOW (ref 8.9–10.3)
GFR calc non Af Amer: 13 mL/min — ABNORMAL LOW (ref >60)
GFR, EST AFRICAN AMERICAN: 16 mL/min — AB (ref >60)
Glucose, Bld: 100 mg/dL — ABNORMAL HIGH (ref 65–99)
Potassium: 3.3 mmol/L — ABNORMAL LOW (ref 3.5–5.1)
SODIUM: 137 mmol/L (ref 135–145)
Total Bilirubin: 1.5 mg/dL — ABNORMAL HIGH (ref 0.3–1.2)
Total Protein: 6.8 g/dL (ref 6.5–8.1)

## 2016-02-13 LAB — PROTIME-INR
INR: 6.12 (ref 0.00–1.49)
Prothrombin Time: 52.3 seconds — ABNORMAL HIGH (ref 11.6–15.2)

## 2016-02-13 MED ORDER — FUROSEMIDE 10 MG/ML IJ SOLN
40.0000 mg | Freq: Once | INTRAMUSCULAR | Status: AC
  Administered 2016-02-13: 40 mg via INTRAVENOUS

## 2016-02-13 MED ORDER — METOLAZONE 5 MG PO TABS
5.0000 mg | ORAL_TABLET | Freq: Every day | ORAL | Status: DC
  Administered 2016-02-13 – 2016-02-15 (×3): 5 mg via ORAL
  Filled 2016-02-13 (×4): qty 1

## 2016-02-13 MED ORDER — POTASSIUM CHLORIDE CRYS ER 20 MEQ PO TBCR
40.0000 meq | EXTENDED_RELEASE_TABLET | Freq: Two times a day (BID) | ORAL | Status: DC
  Administered 2016-02-13 (×2): 40 meq via ORAL
  Filled 2016-02-13 (×2): qty 2

## 2016-02-13 MED ORDER — FUROSEMIDE 10 MG/ML IJ SOLN
80.0000 mg | Freq: Two times a day (BID) | INTRAMUSCULAR | Status: DC
  Administered 2016-02-13 – 2016-02-14 (×2): 80 mg via INTRAVENOUS
  Filled 2016-02-13 (×3): qty 8

## 2016-02-13 NOTE — Progress Notes (Addendum)
CRITICAL VALUE ALERT  Critical value received:  PT- 52.3 INR 6.12  Date of notification:  02/13/16  Time of notification:  0655  Critical value read back:Yes.    Nurse who received alert:  Loney LaurenceJ Maudene Stotler [phone]  MD notified (1st page):  Toniann FailKakrakandy  Time of first page:  [phone]  MD notified (2nd page):Le, P  Time of second page:[phone]  Responding MD:    Time MD responded:

## 2016-02-13 NOTE — Progress Notes (Signed)
[facility] PROGRESS NOTE  Sandra Turner ZOX:[id number] DOB: May 13, 1941    PCP:   [name], MD   HPI:  Sandra Turner is an 75 y.o. female with hx of CKD3, s/p pericardial AVR, afib on Pradaxa, pulm HTN, COPD, diastolic CHF, hx of anemia, presented to the ER as she was having more DOE. She has some coughs, but non productive, and denied fever, chills, or chest pain. Evaluation in the ER showed CXR with vascular congestion, and Hb of 5.4 g per dL, microcytic, with OB negative stool, and no melana or rectal bleeding. She has elevated Cr to 3 with normal K. She was given 2 units of PRBCs, given IV Lasix 60mg  x 1 in the ER, and this brought her Hb to 7 grams.  She still feel "not well" and still has DOE.    Rewiew of Systems:  Constitutional: Negative for malaise, fever and chills. No significant weight loss or weight gain Eyes: Negative for eye pain, redness and discharge, diplopia, visual changes, or flashes of light. ENMT: Negative for ear pain, hoarseness, nasal congestion, sinus pressure and sore throat. No headaches; tinnitus, drooling, or problem swallowing. Cardiovascular: Negative for chest pain, palpitations, diaphoresis, dyspnea and peripheral edema. ; No orthopnea, PND Respiratory: Negative for cough, hemoptysis, wheezing and stridor. No pleuritic chestpain. Gastrointestinal: Negative for nausea, vomiting, diarrhea, constipation, abdominal pain, melena, blood in stool, hematemesis, jaundice and rectal bleeding.    Genitourinary: Negative for frequency, dysuria, incontinence,flank pain and hematuria; Musculoskeletal: Negative for back pain and neck pain. Negative for swelling and trauma.;  Skin: . Negative for pruritus, rash, abrasions, bruising and skin lesion.; ulcerations Neuro: Negative for headache, lightheadedness and neck stiffness. Negative for weakness, altered level of consciousness , altered mental status, extremity weakness, burning feet, involuntary movement,  seizure and syncope.  Psych: negative for anxiety, depression, insomnia, tearfulness, panic attacks, hallucinations, paranoia, suicidal or homicidal ideation    Past Medical History  Diagnosis Date  . Arthritis   . Chronic atrial fibrillation (HCC)     a. previously on Pradaxa - on Coumadin since valve surgery.  . Aortic stenosis     a. Severe - s/p pericardial AVR with MAZE 02/04/15.  Marland Kitchen Pneumonia 09/2014  . History of bronchitis 2015  . Family history of adverse reaction to anesthesia     Uncle with MH in the 60's,cousin in the 52's with MH  . 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 COPD (chronic obstructive pulmonary disease) (HCC)   . Normal coronary arteries     a. By cath 11/2014.  . Pulmonary hypertension (HCC)     a. Mod by cath 11/2014.  Marland Kitchen Chronic diastolic CHF (congestive heart failure) (HCC)   . Vocal cord paralysis     a. Dx post-AVR in 01/2015. Required PANDA, intubation during that admission.  . Pleural effusion   . AKI (acute kidney injury) (HCC)     a. H/o AKI after AVR in 01/2015.  . Tracheomalacia   . Anemia     a. 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    Past Surgical History  Procedure Laterality Date  . None    . Left and right heart catheterization with  coronary angiogram N/A 11/01/2014    Procedure: LEFT AND RIGHT HEART CATHETERIZATION WITH CORONARY ANGIOGRAM;  Surgeon: Kathleene Hazelhristopher D McAlhany, MD;  [facility];  Service: Cardiovascular;  Laterality: N/A;  . Aortic valve replacement N/A 01/19/2015    Procedure: AORTIC VALVE REPLACEMENT (AVR);  Surgeon: Purcell Nailslarence H Owen, MD;  [facility];  Service: Open Heart Surgery;   Laterality: N/A;  . Maze N/A 01/19/2015    Procedure: MAZE;  Surgeon: Purcell Nailslarence H Owen, MD;  [facility];  Service: Open Heart Surgery;  Laterality: N/A;  . Aortic root enlargement N/A 01/19/2015    Procedure:  AORTIC ROOT ENLARGEMENT;  Surgeon: Purcell Nailslarence H Owen, MD;  [facility];  Service: Open Heart Surgery;  Laterality: N/A;  . Tee without cardioversion N/A 01/19/2015    Procedure: TRANSESOPHAGEAL ECHOCARDIOGRAM (TEE);  Surgeon: Purcell Nailslarence H Owen, MD;  [facility];  Service: Open Heart Surgery;  Laterality: N/A;    Medications:  HOME MEDS: Prior to Admission medications   Medication Sig Start Date End Date Taking? Authorizing Provider  albuterol (PROAIR HFA) 108 (90 BASE) MCG/ACT inhaler Inhale 2 puffs into the lungs 4 (four) times daily as needed for wheezing or shortness of breath.   Yes Historical Provider, MD  albuterol (PROVENTIL) (2.5 MG/3ML) 0.083% nebulizer solution Take 3 mLs (2.5 mg total) by nebulization every 4 (four) hours as needed for wheezing or shortness of breath. Patient taking differently: Take 2.5 mg by nebulization 4 (four) times daily.  09/15/14  Yes Erick BlinksJehanzeb Memon, MD  budesonide (PULMICORT) 0.25 MG/2ML nebulizer solution Take 2 mLs (0.25 mg total) by nebulization 2 (two) times daily. 04/07/15  Yes Lupita Leashouglas B McQuaid, MD  CARTIA XT 180 MG 24 hr capsule Take 180 mg by mouth daily.  05/30/15  Yes Historical Provider, MD  Coenzyme Q10 (CO Q-10) 100 MG CAPS Take by mouth daily.   Yes Historical Provider, MD  docusate sodium (COLACE) 100 MG capsule Take 1 capsule (100 mg total) by mouth daily as needed for mild constipation. 04/28/15  Yes Jodelle GrossKathryn M Lawrence, NP  FLOVENT DISKUS 250 MCG/BLIST AEPB INHALE ONE PUFF BY MOUTH TWICE DAILY 08/08/15  Yes Lupita Leashouglas B McQuaid, MD  magnesium oxide (MAG-OX) 400 MG tablet Take 400 mg by mouth 2 (two) times daily.   Yes Historical Provider, MD  metolazone (ZAROXOLYN) 2.5 MG tablet TAKE ONE TABLET BY MOUTH ONCE DAILY 11/02/15  Yes Jonelle SidleSamuel G  McDowell, MD  OXYGEN Inhale into the lungs. 2.5 lpm with exertion   Yes Historical Provider, MD  Potassium Chloride ER 20 MEQ TBCR Take 2 tablets (40 meq) daily 10/20/15  Yes Jonelle SidleSamuel G McDowell, MD  PRADAXA 150 MG CAPS capsule TAKE ONE CAPSULE BY MOUTH TWICE DAILY 01/02/16  Yes Jonelle SidleSamuel G McDowell, MD  torsemide (DEMADEX) 20 MG tablet Take 2 tablets (40 mg total) by mouth 2 (two) times daily. 10/19/15  Yes Jonelle SidleSamuel G McDowell, MD  traMADol (ULTRAM) 50 MG tablet Take 1 tablet (50 mg total) by mouth every 6 (six) hours as needed (pain). 02/21/15  Yes Rowe ClackWayne E Gold, PA-C     Allergies:  Allergies  Allergen Reactions  . Bee Venom Swelling  . Doxycycline     Hoarseness / throat irritation  . Latex Rash  . Penicillins Rash    Social History:   reports that she has never smoked. She has never used smokeless tobacco. She reports that she does not drink alcohol or use illicit drugs.  Family History: Family History  Problem Relation  Age of Onset  . Breast cancer Mother   . Congestive Heart Failure Mother   . Cancer Father     ? Lymphoma  . Malignant hyperthermia Cousin      Physical Exam: Filed Vitals:   02/13/16 0600 02/13/16 0700 02/13/16 0757 02/13/16 0800  BP: 105/44   117/58  Pulse: 78 76  81  Temp:   98.3 F (36.8 C)   TempSrc:   Oral   Resp: 30 14  20   Height:      Weight:      SpO2: 94% 96%  90%   Blood pressure 117/58, pulse 81, temperature 98.3 F (36.8 C), temperature source Oral, resp. rate 20, height 5\' 2"  (1.575 m), weight 110.1 kg (242 lb 11.6 oz), SpO2 90 %.  GEN:  Pleasant  patient lying in the stretcher in no acute distress; cooperative with exam. PSYCH:  alert and oriented x4; does not appear anxious or depressed; affect is appropriate. HEENT: Mucous membranes pink and anicteric; PERRLA; EOM intact; no cervical lymphadenopathy nor thyromegaly or carotid bruit; no JVD; There were no stridor. Neck is very supple. Breasts:: Not examined CHEST WALL: No  tenderness CHEST: Normal respiration, clear to auscultation bilaterally.  HEART: Regular rate and rhythm.  There are no murmur, rub, or gallops.   BACK: No kyphosis or scoliosis; no CVA tenderness ABDOMEN: soft and non-tender; no masses, no organomegaly, normal abdominal bowel sounds; no pannus; no intertriginous candida. There is no rebound and no distention. Rectal Exam: Not done EXTREMITIES: No bone or joint deformity; age-appropriate arthropathy of the hands and knees; no edema; no ulcerations.  There is no calf tenderness. Genitalia: not examined PULSES: 2+ and symmetric SKIN: Normal hydration no rash or ulceration CNS: Cranial nerves 2-12 grossly intact no focal lateralizing neurologic deficit.  Speech is fluent; uvula elevated with phonation, facial symmetry and tongue midline. DTR are normal bilaterally, cerebella exam is intact, barbinski is negative and strengths are equaled bilaterally.  No sensory loss.   Labs on Admission:  Basic Metabolic Panel:  Recent Labs Lab 02-19-2016 0755 02/19/16 0915 02/13/16 0446  NA 136 136 137  K 3.7 3.9 3.3*  CL 90* 90* 90*  CO2 37* 35* 37*  GLUCOSE 106* 115* 100*  BUN 82* 82* 81*  CREATININE 3.32* 3.29* 3.18*  CALCIUM 8.7* 8.9 8.6*   Liver Function Tests:  Recent Labs Lab 02/19/2016 0755 Feb 19, 2016 0915 02/13/16 0446  AST 24 27 28   ALT 14 15 13*  ALKPHOS 108 108 99  BILITOT 1.2 1.2 1.5*  PROT 7.0 7.2 6.8  ALBUMIN 3.3* 3.5 3.2*   CBC:  Recent Labs Lab 02-19-16 0755 2016/02/19 0915 02/13/16 0446  WBC 6.2 8.3 7.4  NEUTROABS 4.2  --   --   HGB 5.2* 5.4* 7.0*  HCT 18.8* 19.9* 23.8*  MCV 74.0* 73.7* 76.0*  PLT 209 258 214   Cardiac Enzymes:  Recent Labs Lab 19-Feb-2016 0755  TROPONINI 0.03   Radiological Exams on Admission: Dg Chest 2 View  February 19, 2016  CLINICAL DATA:  SOB this AM. Non-smoker. A-fib, heart valve replacement, lung surgery. EXAM: CHEST  2 VIEW COMPARISON:  10/12/2015 FINDINGS: Lateral view degraded by patient  arm position. Bilateral glenohumeral joint osteoarthritis. Prior median sternotomy. Midline trachea. Moderate cardiomegaly. Bilateral pleural effusions, larger on the right. Similar. No pneumothorax. Interstitial edema is moderate and increased. Bibasilar airspace disease is increased. IMPRESSION: Moderate congestive heart failure, increased. Bilateral pleural effusions with increased airspace disease which is most likely atelectasis. Electronically Signed  By: Jeronimo Greaves M.D.   On: 2016-03-08 08:01    EKG: Independently reviewed.    Assessment/Plan Present on Admission:  . AKI (acute kidney injury) (HCC) . Acute on chronic diastolic heart failure (HCC) . Atrial fibrillation (HCC) . Chronic anemia . CKD (chronic kidney disease), stage III . Shortness of breath . Anemia  PLAN:  AKI;  Await nephrology's consultation.  Will continue with current meds.  Afib:  Continue with Pradaxa.  INR is up and Vit K was given.  Likely from Pradaxa.  No clinical sign of bleeding.  Acute on chronic diastolic CHF:   Will continue with IV Lasix.  Keep in ICU today.   Valvular disease:  S/p Pericardial AVR.    Stable.   Other plans as per orders. Code Status: FULL Unk Lightning, MD.  FACP [facility] Pager [phone] 7pm to 7am.  02/13/2016, 8:18 AM

## 2016-02-13 NOTE — Clinical Documentation Improvement (Signed)
Hospitalist  Can the diagnosis of anemia be further specified?   Iron deficiency Anemia  Acute Blood Loss Anemia, including the suspected or known cause  Nutritional anemia, including the nutrition or mineral deficits  Chronic Anemia, including the suspected or known cause  Anemia of chronic disease, including the associated chronic disease state  Other  Clinically Undetermined  Document any associated diagnoses/conditions.   Supporting Information: Anemia, unspecified per 6/11 ED provider note. Labs: H/H: 6/12:  7.2/24.4. 6/11:  5.4/19.9.   Please exercise your independent, professional judgment when responding. A specific answer is not anticipated or expected.   Thank Sabino DonovanYou,  Leoncio Hansen [facility] [place] [phone]

## 2016-02-13 NOTE — Consult Note (Signed)
Reason for Consult: Acute kidney injury superimposed on chronic Referring Physician: Dr. Raynelle Chary  Sandra Turner is an 75 y.o. female.  HPI: She is a patient who has history of COPD, diastolic dysfunction, history of aortic stenosis is status post bioprosthetic valve replacement, pulmonary hypertension presently came to the hospital with complaints of difficulty breathing, orthopnea for the last couple of weeks. According to the patient she has this problem on and off for the last 1 year after she had surgery. However recently seems to be getting worse and decided to come to the hospital. When she was evaluated she was found to have worsening of her renal failure and also significant fluid overload hence admitted to the hospital. He was O patient denies any previous history of renal failure patient seems to have underlying chronic renal failure stage III. Patient denies any nausea or vomiting. She has some cough but no sputum production. Presently she states that she is feeling slightly better.  Past Medical History  Diagnosis Date  . Arthritis   . Chronic atrial fibrillation (HCC)     a. previously on Pradaxa - on Coumadin since valve surgery.  . Aortic stenosis     a. Severe - s/p pericardial AVR with MAZE 02/04/15.  Marland Kitchen Pneumonia 09/2014  . History of bronchitis 2015  . Family history of adverse reaction to anesthesia     Uncle with MH in the 60's,cousin in the 28's with MH  . 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 COPD (chronic obstructive pulmonary disease) ([place])   . Normal coronary arteries     a. By cath 11/2014.  . Pulmonary hypertension ([place])     a. Mod  by cath 11/2014.  Marland Kitchen Chronic diastolic CHF (congestive heart failure) ([place])   . Vocal cord paralysis     a. Dx post-AVR in 01/2015. Required PANDA, intubation during that admission.  . Pleural effusion   . AKI (acute kidney injury) ([place])     a. H/o AKI after AVR in 01/2015.  . Tracheomalacia   . Anemia     a. 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    Past Surgical History  Procedure Laterality Date  . None    . Left and right heart catheterization with coronary angiogram N/A 11/01/2014    Procedure: LEFT AND RIGHT HEART CATHETERIZATION WITH CORONARY ANGIOGRAM;  Surgeon: Burnell Blanks, MD;  Location: [facility] CATH LAB;  Service: Cardiovascular;  Laterality: N/A;  . Aortic valve replacement N/A 01/19/2015    Procedure: AORTIC VALVE REPLACEMENT (AVR);  Surgeon: Rexene Alberts, MD;  Location: [place];  Service: Open Heart Surgery;  Laterality: N/A;  . Maze N/A 01/19/2015    Procedure: MAZE;  Surgeon: Rexene Alberts, MD;  Location: [place];  Service: Open Heart Surgery;  Laterality: N/A;  . Aortic root enlargement N/A 01/19/2015    Procedure:  AORTIC ROOT ENLARGEMENT;  Surgeon: Rexene Alberts, MD;  Location: [place];  Service: Open Heart Surgery;  Laterality: N/A;  . Tee without cardioversion N/A 01/19/2015    Procedure: TRANSESOPHAGEAL ECHOCARDIOGRAM (TEE);  Surgeon: Rexene Alberts, MD;  Location: [place];  Service: Open Heart Surgery;  Laterality: N/A;  Family History  Problem Relation Age of Onset  . Breast cancer Mother   . Congestive Heart Failure Mother   . Cancer Father     ? Lymphoma  . Malignant hyperthermia Cousin     Social History:  reports that she has never smoked. She has never used smokeless tobacco. She reports that she does not drink alcohol or use illicit drugs.  Allergies:  Allergies  Allergen Reactions  . Bee Venom Swelling  . Doxycycline     Hoarseness / throat irritation  . Latex Rash  . Penicillins Rash    Medications: I have reviewed the patient's current  medications.  Results for orders placed or performed during the hospital encounter of 03/01/2016 (from the past 48 hour(s))  CBC with Differential     Status: Abnormal   Collection Time: 02/25/2016  7:55 AM  Result Value Ref Range   WBC 6.2 4.0 - 10.5 K/uL   RBC 2.54 (L) 3.87 - 5.11 MIL/uL   Hemoglobin 5.2 (LL) 12.0 - 15.0 g/dL    Comment: REPEATED TO VERIFY CRITICAL RESULT CALLED TO, READ BACK BY AND VERIFIED WITH: ANDERSON D. AT 0848A ON [id number] BY THOMPSON S.    HCT 18.8 (L) 36.0 - 46.0 %   MCV 74.0 (L) 78.0 - 100.0 fL   MCH 20.5 (L) 26.0 - 34.0 pg   MCHC 27.7 (L) 30.0 - 36.0 g/dL   RDW 19.0 (H) 11.5 - 15.5 %   Platelets 209 150 - 400 K/uL   Neutrophils Relative % 69 %   Lymphocytes Relative 14 %   Monocytes Relative 10 %   Eosinophils Relative 6 %   Basophils Relative 1 %   Neutro Abs 4.2 1.7 - 7.7 K/uL   Lymphs Abs 0.9 0.7 - 4.0 K/uL   Monocytes Absolute 0.6 0.1 - 1.0 K/uL   Eosinophils Absolute 0.4 0.0 - 0.7 K/uL   Basophils Absolute 0.1 0.0 - 0.1 K/uL   RBC Morphology POLYCHROMASIA PRESENT     Comment: BASOPHILIC STIPPLING HYPOCHROMIA   Comprehensive metabolic panel     Status: Abnormal   Collection Time: 02/20/2016  7:55 AM  Result Value Ref Range   Sodium 136 135 - 145 mmol/L   Potassium 3.7 3.5 - 5.1 mmol/L   Chloride 90 (L) 101 - 111 mmol/L   CO2 37 (H) 22 - 32 mmol/L   Glucose, Bld 106 (H) 65 - 99 mg/dL   BUN 82 (H) 6 - 20 mg/dL   Creatinine, Ser 3.32 (H) 0.44 - 1.00 mg/dL   Calcium 8.7 (L) 8.9 - 10.3 mg/dL   Total Protein 7.0 6.5 - 8.1 g/dL   Albumin 3.3 (L) 3.5 - 5.0 g/dL   AST 24 15 - 41 U/L   ALT 14 14 - 54 U/L   Alkaline Phosphatase 108 38 - 126 U/L   Total Bilirubin 1.2 0.3 - 1.2 mg/dL   GFR calc non Af Amer 13 (L) >60 mL/min   GFR calc Af Amer 15 (L) >60 mL/min    Comment: (NOTE) The eGFR has been calculated using the CKD EPI equation. This calculation has not been validated in all clinical situations. eGFR's persistently <60 mL/min signify possible  Chronic Kidney Disease.    Anion gap 9 5 - 15  Troponin I     Status: None   Collection Time: 02/09/2016  7:55 AM  Result Value Ref Range   Troponin I 0.03 <0.031 ng/mL    Comment:  NO INDICATION OF MYOCARDIAL INJURY.   Brain natriuretic peptide     Status: Abnormal   Collection Time: 02/25/2016  7:55 AM  Result Value Ref Range   B Natriuretic Peptide 574.0 (H) 0.0 - 100.0 pg/mL  Protime-INR     Status: Abnormal   Collection Time: 02/04/2016  7:55 AM  Result Value Ref Range   Prothrombin Time 58.6 (H) 11.6 - 15.2 seconds   INR 7.12 (HH) 0.00 - 1.49    Comment: REPEATED TO VERIFY CRITICAL RESULT CALLED TO, READ BACK BY AND VERIFIED WITH: ANDERSON D. AT 0856A ON [id number] BY THOMPSON S.    Type and screen [facility]     Status: None   Collection Time: 02/08/2016  9:15 AM  Result Value Ref Range   ABO/RH(D) A NEG    Antibody Screen NEG    Sample Expiration 02/15/2016    Unit Number [id number]    Blood Component Type RED CELLS,LR    Unit division 00    Status of Unit ISSUED,FINAL    Transfusion Status OK TO TRANSFUSE    Crossmatch Result Compatible    Unit Number [id number]    Blood Component Type RED CELLS,LR    Unit division 00    Status of Unit ISSUED,FINAL    Transfusion Status OK TO TRANSFUSE    Crossmatch Result Compatible   CBC     Status: Abnormal   Collection Time: 02/03/2016  9:15 AM  Result Value Ref Range   WBC 8.3 4.0 - 10.5 K/uL   RBC 2.70 (L) 3.87 - 5.11 MIL/uL   Hemoglobin 5.4 (LL) 12.0 - 15.0 g/dL    Comment: REPEATED TO VERIFY CRITICAL RESULT CALLED TO, READ BACK BY AND VERIFIED WITH: CARDWELL,L AT 0948 ON 02/15/2016 BY MOSLEY,J    HCT 19.9 (L) 36.0 - 46.0 %   MCV 73.7 (L) 78.0 - 100.0 fL   MCH 20.0 (L) 26.0 - 34.0 pg   MCHC 27.1 (L) 30.0 - 36.0 g/dL   RDW 19.2 (H) 11.5 - 15.5 %   Platelets 258 150 - 400 K/uL  Protime-INR     Status: Abnormal   Collection Time: 02/19/2016  9:15 AM  Result Value Ref Range   Prothrombin Time 55.9 (H) 11.6  - 15.2 seconds   INR 6.68 (HH) 0.00 - 1.49    Comment: CRITICAL RESULT CALLED TO, READ BACK BY AND VERIFIED WITH: ROBINSON L. AT 0955A ON [id number] BY THOMPSON S.   Comprehensive metabolic panel     Status: Abnormal   Collection Time: 02/08/2016  9:15 AM  Result Value Ref Range   Sodium 136 135 - 145 mmol/L   Potassium 3.9 3.5 - 5.1 mmol/L   Chloride 90 (L) 101 - 111 mmol/L   CO2 35 (H) 22 - 32 mmol/L   Glucose, Bld 115 (H) 65 - 99 mg/dL   BUN 82 (H) 6 - 20 mg/dL   Creatinine, Ser 3.29 (H) 0.44 - 1.00 mg/dL   Calcium 8.9 8.9 - 10.3 mg/dL   Total Protein 7.2 6.5 - 8.1 g/dL   Albumin 3.5 3.5 - 5.0 g/dL   AST 27 15 - 41 U/L   ALT 15 14 - 54 U/L   Alkaline Phosphatase 108 38 - 126 U/L   Total Bilirubin 1.2 0.3 - 1.2 mg/dL   GFR calc non Af Amer 13 (L) >60 mL/min   GFR calc Af Amer 15 (L) >60 mL/min    Comment: (NOTE) The eGFR has been calculated using the  CKD EPI equation. This calculation has not been validated in all clinical situations. eGFR's persistently <60 mL/min signify possible Chronic Kidney Disease.    Anion gap 11 5 - 15  Prepare RBC     Status: None   Collection Time: 02/22/2016  9:15 AM  Result Value Ref Range   Order Confirmation ORDER PROCESSED BY BLOOD BANK   ABO/Rh     Status: None   Collection Time: 02/18/2016  9:15 AM  Result Value Ref Range   ABO/RH(D) A NEG   POC occult blood, ED     Status: None   Collection Time: 02/23/2016  9:18 AM  Result Value Ref Range   Fecal Occult Bld NEGATIVE NEGATIVE  Urinalysis, Routine w reflex microscopic (not at [facility])     Status: Abnormal   Collection Time: 02/15/2016  9:38 AM  Result Value Ref Range   Color, Urine YELLOW YELLOW   APPearance CLEAR CLEAR   Specific Gravity, Urine <1.005 (L) 1.005 - 1.030   pH 7.0 5.0 - 8.0   Glucose, UA NEGATIVE NEGATIVE mg/dL   Hgb urine dipstick NEGATIVE NEGATIVE   Bilirubin Urine NEGATIVE NEGATIVE   Ketones, ur NEGATIVE NEGATIVE mg/dL   Protein, ur NEGATIVE NEGATIVE mg/dL   Nitrite NEGATIVE  NEGATIVE   Leukocytes, UA SMALL (A) NEGATIVE  Urine microscopic-add on     Status: Abnormal   Collection Time: 02/07/2016  9:38 AM  Result Value Ref Range   Squamous Epithelial / LPF 0-5 (A) NONE SEEN   WBC, UA 6-30 0 - 5 WBC/hpf   RBC / HPF 0-5 0 - 5 RBC/hpf   Bacteria, UA FEW (A) NONE SEEN  MRSA PCR Screening     Status: Abnormal   Collection Time: 02/29/2016 11:38 AM  Result Value Ref Range   MRSA by PCR POSITIVE (A) NEGATIVE    Comment: RESULT CALLED TO, READ BACK BY AND VERIFIED WITH: SCHOENEWITZ L. AT 1541 ON [id number] BY THOMPSON S.        The GeneXpert MRSA Assay (FDA approved for NASAL specimens only), is one component of a comprehensive MRSA colonization surveillance program. It is not intended to diagnose MRSA infection nor to guide or monitor treatment for MRSA infections.   Comprehensive metabolic panel     Status: Abnormal   Collection Time: 02/13/16  4:46 AM  Result Value Ref Range   Sodium 137 135 - 145 mmol/L   Potassium 3.3 (L) 3.5 - 5.1 mmol/L   Chloride 90 (L) 101 - 111 mmol/L   CO2 37 (H) 22 - 32 mmol/L   Glucose, Bld 100 (H) 65 - 99 mg/dL   BUN 81 (H) 6 - 20 mg/dL   Creatinine, Ser 3.18 (H) 0.44 - 1.00 mg/dL   Calcium 8.6 (L) 8.9 - 10.3 mg/dL   Total Protein 6.8 6.5 - 8.1 g/dL   Albumin 3.2 (L) 3.5 - 5.0 g/dL   AST 28 15 - 41 U/L   ALT 13 (L) 14 - 54 U/L   Alkaline Phosphatase 99 38 - 126 U/L   Total Bilirubin 1.5 (H) 0.3 - 1.2 mg/dL   GFR calc non Af Amer 13 (L) >60 mL/min   GFR calc Af Amer 16 (L) >60 mL/min    Comment: (NOTE) The eGFR has been calculated using the CKD EPI equation. This calculation has not been validated in all clinical situations. eGFR's persistently <60 mL/min signify possible Chronic Kidney Disease.    Anion gap 10 5 - 15  CBC  Status: Abnormal   Collection Time: 02/13/16  4:46 AM  Result Value Ref Range   WBC 7.4 4.0 - 10.5 K/uL   RBC 3.13 (L) 3.87 - 5.11 MIL/uL   Hemoglobin 7.0 (L) 12.0 - 15.0 g/dL    Comment: DELTA  CHECK NOTED CORRECTED ON 06/12 AT 0610: PREVIOUSLY REPORTED AS DELTA CHECK NOTED    HCT 23.8 (L) 36.0 - 46.0 %   MCV 76.0 (L) 78.0 - 100.0 fL   MCH 22.4 (L) 26.0 - 34.0 pg   MCHC 29.4 (L) 30.0 - 36.0 g/dL   RDW 18.6 (H) 11.5 - 15.5 %   Platelets 214 150 - 400 K/uL  Protime-INR     Status: Abnormal   Collection Time: 02/13/16  4:46 AM  Result Value Ref Range   Prothrombin Time 52.3 (H) 11.6 - 15.2 seconds   INR 6.12 (HH) 0.00 - 1.49    Comment: CRITICAL RESULT CALLED TO, READ BACK BY AND VERIFIED WITH: JESSICA HEARN RN ON [id number] AT 0650 BY RESSEGGER R     Dg Chest 2 View  02/19/2016  CLINICAL DATA:  SOB this AM. Non-smoker. A-fib, heart valve replacement, lung surgery. EXAM: CHEST  2 VIEW COMPARISON:  10/12/2015 FINDINGS: Lateral view degraded by patient arm position. Bilateral glenohumeral joint osteoarthritis. Prior median sternotomy. Midline trachea. Moderate cardiomegaly. Bilateral pleural effusions, larger on the right. Similar. No pneumothorax. Interstitial edema is moderate and increased. Bibasilar airspace disease is increased. IMPRESSION: Moderate congestive heart failure, increased. Bilateral pleural effusions with increased airspace disease which is most likely atelectasis. Electronically Signed   By: Abigail Miyamoto M.D.   On: 02/11/2016 08:01    Review of Systems  Constitutional: Negative for fever.  HENT: Positive for nosebleeds.   Respiratory: Positive for sputum production.   Cardiovascular: Positive for orthopnea, leg swelling and PND. Negative for chest pain.  Gastrointestinal: Negative for nausea, vomiting, diarrhea and blood in stool.  Neurological: Positive for weakness.   Blood pressure 117/58, pulse 81, temperature 98.3 F (36.8 C), temperature source Oral, resp. rate 20, height 5' 2"  (1.575 m), weight 110.1 kg (242 lb 11.6 oz), SpO2 90 %. Physical Exam  Constitutional: She is oriented to person, place, and time. No distress.  Eyes: No scleral icterus.  Neck:  JVD present.  Cardiovascular:  Murmur heard. Irregular rate and rhythm  Respiratory: No respiratory distress. She has no wheezes.  Decreased breath sounds bilaterally  GI: There is no tenderness. There is no rebound.  Musculoskeletal: She exhibits edema.  Neurological: She is alert and oriented to person, place, and time.    Assessment/Plan: Problem #1 acute kidney injury superimposed on chronic: Possibly secondary to cardiorenal syndrome/ATN. Prerenal syndrome cannot be ruled out. Patient is nonoliguric and her BUN and creatinine is slightly better today. Problem #2 chronic renal failure: Stage III. Etiology could be secondary to cardiorenal/recurrent AK I/ischemic. She has ultrasound of the kidneys done on 03/13/15 which showed right kidney to be 10.6 and left kidney 10.5. Presently patient denies any nausea or vomiting. Problem #3 difficulty in breathing: Multifactorial including COPD/bilateral pleural effusion/fluid overload. Presently she is on Lasix but continued complaints of difficulty breathing. Problem #4 anemia: Her hemoglobin is low. Patient has received blood transfusion and her hemoglobin is better. Problem #5 history of aortic stenosis: Status post bioprosthetic valve replacement. Problem #6 history of arterial fibrillation Problem #7 significant anasarca Problem #8 history of COPD Problem #9 bilateral pleural effusion Plan: 1]We'll increase Lasix to 80 mg IV twice a day 2]  will increase metolazone to 5 mg every 3 times a day 3] will start patient potassium 40 mEq by mouth twice a day 4] will check her renal panel in the morning.  Darriana Deboy S 02/13/2016, 9:32 AM

## 2016-02-13 NOTE — Care Management Important Message (Signed)
Important Message  Patient Details  Name: Sandra Turner MRN: [id number] Date of Birth: 1940/12/08   Medicare Important Message Given:  Yes    Malcolm MetroChildress, Chailyn Racette Demske, RN 02/13/2016, 12:31 PM

## 2016-02-14 LAB — RENAL FUNCTION PANEL
ALBUMIN: 3.1 g/dL — AB (ref 3.5–5.0)
Anion gap: 11 (ref 5–15)
BUN: 80 mg/dL — AB (ref 6–20)
CHLORIDE: 89 mmol/L — AB (ref 101–111)
CO2: 36 mmol/L — AB (ref 22–32)
Calcium: 8.4 mg/dL — ABNORMAL LOW (ref 8.9–10.3)
Creatinine, Ser: 2.86 mg/dL — ABNORMAL HIGH (ref 0.44–1.00)
GFR calc Af Amer: 18 mL/min — ABNORMAL LOW (ref >60)
GFR calc non Af Amer: 15 mL/min — ABNORMAL LOW (ref >60)
GLUCOSE: 101 mg/dL — AB (ref 65–99)
POTASSIUM: 3.6 mmol/L (ref 3.5–5.1)
Phosphorus: 4.9 mg/dL — ABNORMAL HIGH (ref 2.5–4.6)
Sodium: 136 mmol/L (ref 135–145)

## 2016-02-14 LAB — FERRITIN: Ferritin: 54 ng/mL (ref 11–307)

## 2016-02-14 MED ORDER — DABIGATRAN ETEXILATE MESYLATE 75 MG PO CAPS
ORAL_CAPSULE | ORAL | Status: AC
  Filled 2016-02-14: qty 1

## 2016-02-14 MED ORDER — FUROSEMIDE 10 MG/ML IJ SOLN
100.0000 mg | Freq: Two times a day (BID) | INTRAVENOUS | Status: DC
  Administered 2016-02-14 – 2016-02-16 (×5): 100 mg via INTRAVENOUS
  Filled 2016-02-14 (×6): qty 10

## 2016-02-14 MED ORDER — POTASSIUM CHLORIDE CRYS ER 20 MEQ PO TBCR
40.0000 meq | EXTENDED_RELEASE_TABLET | Freq: Every day | ORAL | Status: DC
  Administered 2016-02-14: 40 meq via ORAL
  Filled 2016-02-14: qty 2

## 2016-02-14 NOTE — Progress Notes (Signed)
Dr. Conley RollsLe gave order to check CBC 02/15/16 in the morning.

## 2016-02-14 NOTE — Progress Notes (Addendum)
[facility] PROGRESS NOTE  Sandra Turner BMW:[id number] DOB: Oct 19, 1940    PCP:   [name], MD   HPI:  Sandra Turner is an 75 y.o. female with hx of CKD3, s/p pericardial AVR, afib on Pradaxa, pulm HTN, COPD, diastolic CHF, hx of anemia, presented to the ER as she was having more DOE. She has some coughs, but non productive, and denied fever, chills, or chest pain. Evaluation in the ER showed CXR with vascular congestion, and Hb of 5.4 g per dL, microcytic, with OB negative stool, and no melana or rectal bleeding. She has elevated Cr to 3 with normal K. She was given 2 units of PRBCs, given IV Lasix 60mg  x 1 in the ER, and this brought her Hb to 7 grams. She still feel "not well" and still has DOE. Dr Adalberto Cole has seen her in consultation, increased her Lasix and give metalazone.  She is feeling better, but still having SOB, unclear her baseline.   Rewiew of Systems:  Constitutional: Negative for malaise, fever and chills. No significant weight loss or weight gain Eyes: Negative for eye pain, redness and discharge, diplopia, visual changes, or flashes of light. ENMT: Negative for ear pain, hoarseness, nasal congestion, sinus pressure and sore throat. No headaches; tinnitus, drooling, or problem swallowing. Cardiovascular: Negative for chest pain, palpitations, diaphoresis, dyspnea and peripheral edema. ; No orthopnea, PND Respiratory: Negative for cough, hemoptysis, wheezing and stridor. No pleuritic chestpain. Gastrointestinal: Negative for nausea, vomiting, diarrhea, constipation, abdominal pain, melena, blood in stool, hematemesis, jaundice and rectal bleeding.    Genitourinary: Negative for frequency, dysuria, incontinence,flank pain and hematuria; Musculoskeletal: Negative for back pain and neck pain. Negative for swelling and trauma.;  Skin: . Negative for pruritus, rash, abrasions, bruising and skin lesion.; ulcerations Neuro: Negative for headache, lightheadedness and neck  stiffness. Negative for weakness, altered level of consciousness , altered mental status, extremity weakness, burning feet, involuntary movement, seizure and syncope.  Psych: negative for anxiety, depression, insomnia, tearfulness, panic attacks, hallucinations, paranoia, suicidal or homicidal ideation    Past Medical History  Diagnosis Date  . Arthritis   . Chronic atrial fibrillation (HCC)     a. previously on Pradaxa - on Coumadin since valve surgery.  . Aortic stenosis     a. Severe - s/p pericardial AVR with MAZE 02/04/15.  Marland Kitchen Pneumonia 09/2014  . History of bronchitis 2015  . Family history of adverse reaction to anesthesia     Uncle with MH in the 60's,cousin in the 33's with MH  . 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 COPD (chronic obstructive pulmonary disease) (HCC)   . Normal coronary arteries     a. By cath 11/2014.  . Pulmonary hypertension (HCC)     a. Mod by cath 11/2014.  Marland Kitchen Chronic diastolic CHF (congestive heart failure) (HCC)   . Vocal cord paralysis     a. Dx post-AVR in 01/2015. Required PANDA, intubation during that admission.  . Pleural effusion   . AKI (acute kidney injury) (HCC)     a. H/o AKI after AVR in 01/2015.  . Tracheomalacia   . Anemia     a. 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  Past Surgical History  Procedure Laterality Date  . None    . Left and right heart catheterization with coronary angiogram N/A 11/01/2014    Procedure: LEFT AND RIGHT HEART CATHETERIZATION WITH CORONARY ANGIOGRAM;  Surgeon: [name], MD;  Location[facility] CATH LAB;  Service: Cardiovascular;  Laterality: N/A;  . Aortic valve replacement  N/A 01/19/2015    Procedure: AORTIC VALVE REPLACEMENT (AVR);  Surgeon: Purcell Nails, MD;  Location: [facility] OR;  Service: Open Heart Surgery;  Laterality: N/A;  . Maze N/A 01/19/2015    Procedure: MAZE;  Surgeon: Purcell Nails, MD;  Location: [facility] OR;  Service: Open Heart Surgery;  Laterality: N/A;  . Aortic root enlargement N/A 01/19/2015    Procedure:  AORTIC ROOT ENLARGEMENT;  Surgeon: Purcell Nails, MD;  Location: [facility] OR;  Service: Open Heart Surgery;  Laterality: N/A;  . Tee without cardioversion N/A 01/19/2015    Procedure: TRANSESOPHAGEAL ECHOCARDIOGRAM (TEE);  Surgeon: Purcell Nails, MD;  Location: [facility] OR;  Service: Open Heart Surgery;  Laterality: N/A;    Medications:  HOME MEDS: Prior to Admission medications   Medication Sig Start Date End Date Taking? Authorizing Provider  albuterol (PROAIR HFA) 108 (90 BASE) MCG/ACT inhaler Inhale 2 puffs into the lungs 4 (four) times daily as needed for wheezing or shortness of breath.   Yes Historical Provider, MD  albuterol (PROVENTIL) (2.5 MG/3ML) 0.083% nebulizer solution Take 3 mLs (2.5 mg total) by nebulization every 4 (four) hours as needed for wheezing or shortness of breath. Patient taking differently: Take 2.5 mg by nebulization 4 (four) times daily.  09/15/14  Yes Erick Blinks, MD  budesonide (PULMICORT) 0.25 MG/2ML nebulizer solution Take 2 mLs (0.25 mg total) by nebulization 2 (two) times daily. 04/07/15  Yes Lupita Leash, MD  CARTIA XT 180 MG 24 hr capsule Take 180 mg by mouth daily.  05/30/15  Yes Historical Provider, MD  Coenzyme Q10 (CO Q-10) 100 MG CAPS Take by mouth daily.   Yes Historical Provider, MD  docusate sodium (COLACE) 100 MG capsule Take 1 capsule (100 mg total) by mouth daily as needed for mild constipation. 04/28/15  Yes Jodelle Gross, NP  FLOVENT DISKUS 250 MCG/BLIST AEPB INHALE ONE PUFF BY MOUTH TWICE DAILY 08/08/15  Yes Lupita Leash, MD  magnesium oxide (MAG-OX) 400 MG tablet Take 400 mg by mouth 2 (two)  times daily.   Yes Historical Provider, MD  metolazone (ZAROXOLYN) 2.5 MG tablet TAKE ONE TABLET BY MOUTH ONCE DAILY 11/02/15  Yes [name], MD  OXYGEN Inhale into the lungs. 2.5 lpm with exertion   Yes Historical Provider, MD  Potassium Chloride ER 20 MEQ TBCR Take 2 tablets (40 meq) daily 10/20/15  Yes [name], MD  PRADAXA 150 MG CAPS capsule TAKE ONE CAPSULE BY MOUTH TWICE DAILY 01/02/16  Yes [name], MD  torsemide (DEMADEX) 20 MG tablet Take 2 tablets (40 mg total) by mouth 2 (two) times daily. 10/19/15  Yes [name], MD  traMADol (ULTRAM) 50 MG tablet Take 1 tablet (50 mg total) by mouth every 6 (six) hours as needed (pain). 02/21/15  Yes Rowe Clack, PA-C     Allergies:  Allergies  Allergen Reactions  . Bee Venom Swelling  . Doxycycline     Hoarseness / throat irritation  . Latex Rash  . Penicillins Rash    Social History:   reports that she has never smoked. She has never used smokeless tobacco.  She reports that she does not drink alcohol or use illicit drugs.  Family History: Family History  Problem Relation Age of Onset  . Breast cancer Mother   . Congestive Heart Failure Mother   . Cancer Father     ? Lymphoma  . Malignant hyperthermia Cousin      Physical Exam: Filed Vitals:   02/14/16 0600 02/14/16 0650 02/14/16 0700 02/14/16 0800  BP: 116/58  105/43 110/41  Pulse: 78  78 62  Temp:      TempSrc:      Resp: 22  19 27   Height:      Weight:      SpO2: 96% 100% 97% 98%   Blood pressure 110/41, pulse 62, temperature 98.4 F (36.9 C), temperature source Oral, resp. rate 27, height 5\' 2"  (1.575 m), weight 103.9 kg (229 lb 0.9 oz), SpO2 98 %.  GEN:  Pleasant  patient lying in the stretcher in no acute distress; cooperative with exam. PSYCH:  alert and oriented x4; does not appear anxious or depressed; affect is appropriate. HEENT: Mucous membranes pink and anicteric; PERRLA; EOM intact; no cervical lymphadenopathy nor thyromegaly  or carotid bruit; no JVD; There were no stridor. Neck is very supple. Breasts:: Not examined CHEST WALL: No tenderness CHEST: Normal respiration, clear to auscultation bilaterally. Mild rales, minimal wheezing. HEART: Regular rate and rhythm.  There are no murmur, rub, or gallops.   BACK: No kyphosis or scoliosis; no CVA tenderness ABDOMEN: soft and non-tender; no masses, no organomegaly, normal abdominal bowel sounds; no pannus; no intertriginous candida. There is no rebound and no distention. Rectal Exam: Not done EXTREMITIES: No bone or joint deformity; age-appropriate arthropathy of the hands and knees; no edema; no ulcerations.  There is no calf tenderness. Genitalia: not examined PULSES: 2+ and symmetric SKIN: Normal hydration no rash or ulceration CNS: Cranial nerves 2-12 grossly intact no focal lateralizing neurologic deficit.  Speech is fluent; uvula elevated with phonation, facial symmetry and tongue midline. DTR are normal bilaterally, cerebella exam is intact, barbinski is negative and strengths are equaled bilaterally.  No sensory loss.   Labs on Admission:  Basic Metabolic Panel:  Recent Labs Lab 02/26/2016 0755 02/20/2016 0915 02/13/16 0446 02/14/16 0415  NA 136 136 137 136  K 3.7 3.9 3.3* 3.6  CL 90* 90* 90* 89*  CO2 37* 35* 37* 36*  GLUCOSE 106* 115* 100* 101*  BUN 82* 82* 81* 80*  CREATININE 3.32* 3.29* 3.18* 2.86*  CALCIUM 8.7* 8.9 8.6* 8.4*  PHOS  --   --   --  4.9*   Liver Function Tests:  Recent Labs Lab 02/07/2016 0755 02/13/2016 0915 02/13/16 0446 02/14/16 0415  AST 24 27 28   --   ALT 14 15 13*  --   ALKPHOS 108 108 99  --   BILITOT 1.2 1.2 1.5*  --   PROT 7.0 7.2 6.8  --   ALBUMIN 3.3* 3.5 3.2* 3.1*     Recent Labs Lab 02/05/2016 0755 02/23/2016 0915 02/13/16 0446 02/13/16 1030  WBC 6.2 8.3 7.4 7.3  NEUTROABS 4.2  --   --   --   HGB 5.2* 5.4* 7.0* 7.2*  HCT 18.8* 19.9* 23.8* 24.4*  MCV 74.0* 73.7* 76.0* 76.3*  PLT 209 258 214 187   Cardiac  Enzymes:  Recent Labs Lab 02/13/2016 0755  TROPONINI 0.03    Assessment/Plan Present on Admission:  . AKI (acute kidney injury) (HCC) . Acute on chronic diastolic heart failure (HCC) . Atrial  fibrillation (HCC) . Chronic anemia . CKD (chronic kidney disease), stage III . Shortness of breath . Anemia  PLAN:  AKI; Await nephrology's consultation. Will continue with current meds.  Afib: Continue with Pradaxa. INR is up and Vit K was given. Likely from Pradaxa. No clinical sign of bleeding.  Acute on chronic diastolic CHF: Will continue with IV Lasix. Transfer her to telemetry today.  Increase activitiy.   Anemia:  2 units of PRBC given, unclear if EPOGEN would help.  Have deferred to nephrology.  Will check iron studies  Valvular disease: S/p Pericardial AVR. Stable.   Other plans as per orders. Code Status: FULL Unk LightningODE.    Joel Cowin, MD.  FACP [facility] Pager [phone] 7pm to 7am.  02/14/2016, 9:47 AM

## 2016-02-14 NOTE — Progress Notes (Signed)
Subjective: Interval History: has complaints still some difficulty breathing but she is much better. She has less cough. She doesn't have any nausea or vomiting..  Objective: Vital signs in last 24 hours: Temp:  [97.5 F (36.4 C)-98.5 F (36.9 C)] 98.4 F (36.9 C) (06/13 0400) Pulse Rate:  [38-92] 62 (06/13 0800) Resp:  [13-27] 27 (06/13 0800) BP: (98-136)/(30-69) 110/41 mmHg (06/13 0800) SpO2:  [89 %-100 %] 98 % (06/13 0800) Weight:  [103.9 kg (229 lb 0.9 oz)] 103.9 kg (229 lb 0.9 oz) (06/13 0500) Weight change: 4.109 kg (9 lb 0.9 oz)  Intake/Output from previous day: 06/12 0701 - 06/13 0700 In: 960 [P.O.:960] Out: 2100 [Urine:2100] Intake/Output this shift:    General appearance: alert, cooperative and no distress Resp: diminished breath sounds bibasilar and bilaterally Cardio: regularly irregular rhythm and systolic murmur: holosystolic 3/6, blowing at 2nd left intercostal space GI: Distended but nontender Extremities: venous stasis dermatitis noted and edem hea  Lab Results:  Recent Labs  02/13/16 0446 02/13/16 1030  WBC 7.4 7.3  HGB 7.0* 7.2*  HCT 23.8* 24.4*  PLT 214 187   BMET:  Recent Labs  02/13/16 0446 02/14/16 0415  NA 137 136  K 3.3* 3.6  CL 90* 89*  CO2 37* 36*  GLUCOSE 100* 101*  BUN 81* 80*  CREATININE 3.18* 2.86*  CALCIUM 8.6* 8.4*   No results for input(s): PTH in the last 72 hours. Iron Studies: No results for input(s): IRON, TIBC, TRANSFERRIN, FERRITIN in the last 72 hours.  Studies/Results: No results found.  I have reviewed the patient's current medications.  Assessment/Plan: Problem #1 acute kidney injury superimposed on chronic. Presently her BUN and creatinine seems to be improving. Patient at this moment doesn't have any uremic signs and symptoms. Problem but to hypokalemia: Potassium is normal Problem #3 anasarca: She is on Lasix and metolazone. She has about 2 L of urine output. Seems to be improving but patient still with  significant fluid overload Problem #4 anemia: Her hemoglobin remains low Problem #5: Difficulty breathing. Presently multifactorial. Seems to be improving Problem #6 metabolic bone disease: Calcium is in range. Problem #7 history of aortic valve replacement Plan: 1]We'll increase Lasix to 100 mg IV twice a day 2] will continue with metolazone 3 we'll check her renal panel in the morning.    LOS: 2 days   Cathyrn Deas S 02/14/2016,8:46 AM

## 2016-02-14 NOTE — Progress Notes (Signed)
Pt transferred to 326, belongings with patient. Report given to RN.

## 2016-02-15 LAB — RENAL FUNCTION PANEL
ALBUMIN: 3.4 g/dL — AB (ref 3.5–5.0)
Anion gap: 10 (ref 5–15)
BUN: 77 mg/dL — AB (ref 6–20)
CALCIUM: 8.6 mg/dL — AB (ref 8.9–10.3)
CO2: 36 mmol/L — AB (ref 22–32)
CREATININE: 2.51 mg/dL — AB (ref 0.44–1.00)
Chloride: 88 mmol/L — ABNORMAL LOW (ref 101–111)
GFR calc Af Amer: 21 mL/min — ABNORMAL LOW (ref >60)
GFR calc non Af Amer: 18 mL/min — ABNORMAL LOW (ref >60)
GLUCOSE: 95 mg/dL (ref 65–99)
PHOSPHORUS: 4 mg/dL (ref 2.5–4.6)
Potassium: 3.2 mmol/L — ABNORMAL LOW (ref 3.5–5.1)
SODIUM: 134 mmol/L — AB (ref 135–145)

## 2016-02-15 LAB — CBC
HCT: 23.9 % — ABNORMAL LOW (ref 36.0–46.0)
HEMOGLOBIN: 7 g/dL — AB (ref 12.0–15.0)
MCH: 22.6 pg — AB (ref 26.0–34.0)
MCHC: 29.3 g/dL — AB (ref 30.0–36.0)
MCV: 77.1 fL — ABNORMAL LOW (ref 78.0–100.0)
Platelets: 199 10*3/uL (ref 150–400)
RBC: 3.1 MIL/uL — ABNORMAL LOW (ref 3.87–5.11)
RDW: 20 % — AB (ref 11.5–15.5)
WBC: 8.8 10*3/uL (ref 4.0–10.5)

## 2016-02-15 MED ORDER — ALBUTEROL SULFATE (2.5 MG/3ML) 0.083% IN NEBU
2.5000 mg | INHALATION_SOLUTION | Freq: Three times a day (TID) | RESPIRATORY_TRACT | Status: DC
Start: 2016-02-15 — End: 2016-02-16
  Administered 2016-02-15 – 2016-02-16 (×4): 2.5 mg via RESPIRATORY_TRACT
  Filled 2016-02-15 (×4): qty 3

## 2016-02-15 MED ORDER — POTASSIUM CHLORIDE CRYS ER 20 MEQ PO TBCR
40.0000 meq | EXTENDED_RELEASE_TABLET | Freq: Two times a day (BID) | ORAL | Status: DC
  Administered 2016-02-15 (×2): 40 meq via ORAL
  Filled 2016-02-15 (×3): qty 2

## 2016-02-15 NOTE — Progress Notes (Signed)
PROGRESS NOTE    Sandra Turner  ZOX:[id number] DOB: [birth year] DOA: 02/07/2016 PCP: [name], MD     Brief Narrative:  75 year old woman admitted on 6/11 with complaints of shortness of breath. She was found to have acute CHF, hemoglobin of 5.4 and a creatinine of 3. She was admitted for evaluation and management she has been transfused a total of 2 units of PRBCs. Nephrology has been on board and is assisting with Lasix dosing.   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 -Acute component is probably secondary to cardiorenal syndrome. -Appreciate nephrology's input in regards to Lasix and metolazone dosing.  Acute on chronic diastolic CHF -Echo from 7/16: Ejection fraction of 55-60% with mild LVH. -She is close to 4 L negative since admission. -Remains volume overloaded on exam.  Atrial fibrillation -Anticoagulated on Pradaxa.  Status post tissue aortic valve replacement  Anemia of chronic disease -Due to chronic kidney disease. -Has received a total of 2 units of PRBCs this admission. -Hemoglobin has responded appropriately and remains around 7. -Suspect may need another transfusion prior to discharge.   DVT prophylaxis: on pradaxa Code Status: Full code Family Communication: Patient only Disposition Plan: To be determined  Consultants:   Nephrology  Procedures:   None  Antimicrobials:   None    Subjective: Still feels short of breath, chest pain, no headache  Objective: Filed Vitals:   02/15/16 0744  02/15/16 0751 02/15/16 1400 02/15/16 1410  BP:   166/52   Pulse:   67   Temp:   97.8 F (36.6 C)   TempSrc:   Oral   Resp:   18   Height:      Weight:      SpO2: 90% 95% 100% 98%    Intake/Output Summary (Last 24 hours) at 02/15/16 1552 Last data filed at 02/15/16 1200  Gross per 24 hour  Intake    240 ml  Output   1800 ml  Net  -1560 ml   Filed Weights   02/23/2016 1157 02/13/16 0500 02/14/16 0500  Weight: 112.7 kg (248 lb 7.3 oz) 110.1 kg (242 lb 11.6 oz) 103.9 kg (229 lb 0.9 oz)    Examination:  General exam: Alert, awake, oriented x 3 Respiratory system: Mild bibasilar crackles Cardiovascular system:Irregularly irregular Gastrointestinal system: Abdomen is nondistended, soft and nontender. No organomegaly or masses felt. Normal bowel sounds heard. Central nervous system: Alert and oriented. No focal neurological deficits. Extremities: 2+ pitting edema bilaterally, also component of nonpitting edema. Skin: No rashes, lesions or ulcers Psychiatry: Judgement and insight appear normal. Mood & affect appropriate.     Data Reviewed: I have personally reviewed following labs and imaging studies  CBC:  Recent Labs Lab 02/14/2016 0755 03/01/2016 0915 02/13/16 0446 02/13/16 1030 02/15/16 0420  WBC 6.2 8.3 7.4 7.3 8.8  NEUTROABS 4.2  --   --   --   --   HGB 5.2* 5.4* 7.0* 7.2* 7.0*  HCT 18.8* 19.9* 23.8* 24.4* 23.9*  MCV 74.0* 73.7*  76.0* 76.3* 77.1*  PLT 209 258 214 187 199   Basic Metabolic Panel:  Recent Labs Lab 2016-03-09 0755 09-Mar-2016 0915 02/13/16 0446 02/14/16 0415 02/15/16 0420  NA 136 136 137 136 134*  K 3.7 3.9 3.3* 3.6 3.2*  CL 90* 90* 90* 89* 88*  CO2 37* 35* 37* 36* 36*  GLUCOSE 106* 115* 100* 101* 95  BUN 82* 82* 81* 80* 77*  CREATININE 3.32* 3.29* 3.18* 2.86* 2.51*  CALCIUM 8.7* 8.9 8.6* 8.4* 8.6*  PHOS  --   --   --  4.9* 4.0   GFR: Estimated Creatinine Clearance: 22.6 mL/min (by C-G formula based on Cr of 2.51). Liver Function  Tests:  Recent Labs Lab 03/09/16 0755 Mar 09, 2016 0915 02/13/16 0446 02/14/16 0415 02/15/16 0420  AST --   --   ALT 14 15 13*  --   --   ALKPHOS 108 108 99  --   --   BILITOT 1.2 1.2 1.5*  --   --   PROT 7.0 7.2 6.8  --   --   ALBUMIN 3.3* 3.5 3.2* 3.1* 3.4*   No results for input(s): LIPASE, AMYLASE in the last 168 hours. No results for input(s): AMMONIA in the last 168 hours. Coagulation Profile:  Recent Labs Lab 2016/03/09 0755 2016-03-09 0915 02/13/16 0446  INR 7.12* 6.68* 6.12*   Cardiac Enzymes:  Recent Labs Lab 2016/03/09 0755  TROPONINI 0.03   BNP (last 3 results) No results for input(s): PROBNP in the last 8760 hours. HbA1C: No results for input(s): HGBA1C in the last 72 hours. CBG: No results for input(s): GLUCAP in the last 168 hours. Lipid Profile: No results for input(s): CHOL, HDL, LDLCALC, TRIG, CHOLHDL, LDLDIRECT in the last 72 hours. Thyroid Function Tests: No results for input(s): TSH, T4TOTAL, FREET4, T3FREE, THYROIDAB in the last 72 hours. Anemia Panel:  Recent Labs  02/14/16 1014  FERRITIN 54   Urine analysis:    Component Value Date/Time   COLORURINE YELLOW 09-Mar-2016 0938   APPEARANCEUR CLEAR 03-09-16 0938   LABSPEC <1.005* 03-09-16 0938   PHURINE 7.0 Mar 09, 2016 0938   GLUCOSEU NEGATIVE 2016-03-09 0938   HGBUR NEGATIVE 03/09/16 0938   BILIRUBINUR NEGATIVE 2016/03/09 0938   KETONESUR NEGATIVE 09-Mar-2016 0938   PROTEINUR NEGATIVE 09-Mar-2016 0938   UROBILINOGEN 0.2 01/17/2015 1451   NITRITE NEGATIVE March 09, 2016 0938   LEUKOCYTESUR SMALL* Mar 09, 2016 0938   Sepsis Labs: (procalcitonin:4,lacticidven:4)  ) Recent Results (from the past 240 hour(s))  MRSA PCR Screening     Status: Abnormal   Collection Time: 2016-03-09 11:38 AM  Result Value Ref Range Status   MRSA by PCR POSITIVE (A) NEGATIVE Final    Comment: RESULT CALLED TO, READ BACK BY AND VERIFIED WITH: SCHOENEWITZ L. AT 1541 ON [id number] BY THOMPSON S.         The GeneXpert MRSA Assay (FDA approved for NASAL specimens only), is one component of a comprehensive MRSA colonization surveillance program. It is not intended to diagnose MRSA infection nor to guide or monitor treatment for MRSA infections.          Radiology Studies: No results found.      Scheduled Meds: . albuterol  2.5 mg Inhalation TID  . antiseptic oral rinse  7 mL Mouth Rinse BID  . budesonide  0.5 mg Inhalation BID  . Chlorhexidine Gluconate Cloth  6 each Topical Q0600  . dabigatran  75 mg Oral BID  . diltiazem  180 mg Oral Daily  .  furosemide  100 mg Intravenous BID  . metolazone  5 mg Oral Daily  . mupirocin ointment  1 application Nasal BID  . potassium chloride  40 mEq Oral BID  . sodium chloride flush  3 mL Intravenous Q12H   Continuous Infusions:    LOS: 3 days    Time spent: 30 minutes. Greater than 50% of this time was spent in direct contact with the patient coordinating care.     Chaya JanHERNANDEZ ACOSTA,ESTELA, MD [facility] Pager [phone]  If 7PM-7AM, please contact night-coverage [email or web address] Password [email or web address] 02/15/2016, 3:52 PM

## 2016-02-15 NOTE — Care Management Important Message (Signed)
Important Message  Patient Details  Name: Sandra Turner MRN: [id number] Date of Birth: 1941/08/14   Medicare Important Message Given:  Yes    Malcolm MetroChildress, Tyreese Thain Demske, RN 02/15/2016, 3:57 PM

## 2016-02-15 NOTE — Progress Notes (Signed)
Subjective: Interval History: has complaints still some difficulty breathing but her cough is better. Denies any nausea or vomting. Her appetite is ok  Objective: Vital signs in last 24 hours: Pulse Rate:  [60-116] 102 (06/13 1400) Resp:  [19-27] 21 (06/13 1400) BP: (110-122)/(41-60) 122/48 mmHg (06/13 1400) SpO2:  [90 %-100 %] 92 % (06/14 0224) Weight change:   Intake/Output from previous day: 06/13 0701 - 06/14 0700 In: -  Out: 1150 [Urine:1150] Intake/Output this shift:    Generally alert and on Oxygen Chest bilateral inspiratory crackles and wheezing Heart Irrigular rate and rhythm, III/VI systolic murmur Abdomen: Obese Extremities: She has 2-3+ edema/venous stasis with chronic changes.  Lab Results:  Recent Labs  02/13/16 1030 02/15/16 0420  WBC 7.3 8.8  HGB 7.2* 7.0*  HCT 24.4* 23.9*  PLT 187 199   BMET:   Recent Labs  02/14/16 0415 02/15/16 0420  NA 136 134*  K 3.6 3.2*  CL 89* 88*  CO2 36* 36*  GLUCOSE 101* 95  BUN 80* 77*  CREATININE 2.86* 2.51*  CALCIUM 8.4* 8.6*   No results for input(s): PTH in the last 72 hours. Iron Studies:   Recent Labs  02/14/16 1014  FERRITIN 54    Studies/Results: No results found.  I have reviewed the patient's current medications.  Assessment/Plan: Problem #1 acute kidney injury superimposed on chronic. Renal function continued to improve. Patient presently is asymptomatic. Problem #2 hypokalemia: Potassium is low. Patient is on potassium supplement. Problem #3 anasarca: She is on Lasix and metolazone. She has about 1500 L of urine output. Patient is still with significant edema. Lasix has been increased yesterday but her urine output doesn't improve. Problem #4 anemia: Her hemoglobin remains low. Seems to be possibly iron deficiency anemia. Ferritin is low but iron saturation is not available. Problem #5: Difficulty breathing. Presently multifactorial. Still patient complains of difficulty breathing. Problem  #6 metabolic bone disease: Calcium is in range. Problem #7 history of aortic valve replacement Plan: 1]We'll increase Lasix to 160 mg IV twice a day 2] will continue with metolazone 3] we'll check her renal panel and iron saturation in the morning.    LOS: 3 days   Zachery Niswander S 02/15/2016,7:30 AM

## 2016-02-16 ENCOUNTER — Inpatient Hospital Stay (HOSPITAL_COMMUNITY): Payer: Medicare Other

## 2016-02-16 ENCOUNTER — Inpatient Hospital Stay (HOSPITAL_COMMUNITY): Payer: Medicare Other | Admitting: Anesthesiology

## 2016-02-16 DIAGNOSIS — J9600 Acute respiratory failure, unspecified whether with hypoxia or hypercapnia: Secondary | ICD-10-CM

## 2016-02-16 DIAGNOSIS — D649 Anemia, unspecified: Secondary | ICD-10-CM

## 2016-02-16 DIAGNOSIS — J9601 Acute respiratory failure with hypoxia: Secondary | ICD-10-CM

## 2016-02-16 DIAGNOSIS — I509 Heart failure, unspecified: Secondary | ICD-10-CM

## 2016-02-16 DIAGNOSIS — Z8679 Personal history of other diseases of the circulatory system: Secondary | ICD-10-CM

## 2016-02-16 DIAGNOSIS — N189 Chronic kidney disease, unspecified: Secondary | ICD-10-CM

## 2016-02-16 DIAGNOSIS — N179 Acute kidney failure, unspecified: Secondary | ICD-10-CM

## 2016-02-16 DIAGNOSIS — Z9889 Other specified postprocedural states: Secondary | ICD-10-CM

## 2016-02-16 DIAGNOSIS — Z954 Presence of other heart-valve replacement: Secondary | ICD-10-CM

## 2016-02-16 LAB — PREPARE RBC (CROSSMATCH)

## 2016-02-16 LAB — BLOOD GAS, ARTERIAL
ACID-BASE EXCESS: 13.8 mmol/L — AB (ref 0.0–2.0)
ACID-BASE EXCESS: 16.8 mmol/L — AB (ref 0.0–2.0)
Acid-Base Excess: 15.7 mmol/L — ABNORMAL HIGH (ref 0.0–2.0)
Acid-Base Excess: 15.7 mmol/L — ABNORMAL HIGH (ref 0.0–2.0)
BICARBONATE: 39.2 meq/L — AB (ref 20.0–24.0)
Bicarbonate: 37.3 mEq/L — ABNORMAL HIGH (ref 20.0–24.0)
Bicarbonate: 39.3 mEq/L — ABNORMAL HIGH (ref 20.0–24.0)
Bicarbonate: 40.2 mEq/L — ABNORMAL HIGH (ref 20.0–24.0)
DELIVERY SYSTEMS: POSITIVE
Drawn by: 22223
Drawn by: 22223
Drawn by: 234301
Drawn by: 234301
EXPIRATORY PAP: 6
FIO2: 100
FIO2: 40
FIO2: 40
FIO2: 50
Inspiratory PAP: 12
LHR: 15 {breaths}/min
Mode: POSITIVE
O2 SAT: 91.4 %
O2 Saturation: 88.6 %
O2 Saturation: 96.3 %
O2 Saturation: 98.9 %
PCO2 ART: 58.9 mmHg — AB (ref 35.0–45.0)
PEEP/CPAP: 5 cmH2O
PEEP: 5 cmH2O
PH ART: 7.435 (ref 7.350–7.450)
PH ART: 7.554 — AB (ref 7.350–7.450)
PO2 ART: 166 mmHg — AB (ref 80.0–100.0)
PO2 ART: 58.6 mmHg — AB (ref 80.0–100.0)
Patient temperature: 37
RATE: 20 resp/min
TCO2: 11.7 mmol/L (ref 0–100)
TCO2: 9.3 mmol/L (ref 0–100)
VT: 440 mL
VT: 440 mL
pCO2 arterial: 55.8 mmHg — ABNORMAL HIGH (ref 35.0–45.0)
pCO2 arterial: 48.3 mmHg — ABNORMAL HIGH (ref 35.0–45.0)
pCO2 arterial: 46.4 mmHg — ABNORMAL HIGH (ref 35.0–45.0)
pH, Arterial: 7.475 — ABNORMAL HIGH (ref 7.350–7.450)
pH, Arterial: 7.529 — ABNORMAL HIGH (ref 7.350–7.450)
pO2, Arterial: 72.3 mmHg — ABNORMAL LOW (ref 80.0–100.0)
pO2, Arterial: 50.2 mmHg — ABNORMAL LOW (ref 80.0–100.0)

## 2016-02-16 LAB — CBC
HCT: 23 % — ABNORMAL LOW (ref 36.0–46.0)
Hemoglobin: 6.7 g/dL — CL (ref 12.0–15.0)
MCH: 22.6 pg — ABNORMAL LOW (ref 26.0–34.0)
MCHC: 29.1 g/dL — ABNORMAL LOW (ref 30.0–36.0)
MCV: 77.7 fL — ABNORMAL LOW (ref 78.0–100.0)
PLATELETS: 222 10*3/uL (ref 150–400)
RBC: 2.96 MIL/uL — AB (ref 3.87–5.11)
RDW: 20.5 % — AB (ref 11.5–15.5)
WBC: 10.6 10*3/uL — AB (ref 4.0–10.5)

## 2016-02-16 LAB — RENAL FUNCTION PANEL
ALBUMIN: 3.4 g/dL — AB (ref 3.5–5.0)
Anion gap: 13 (ref 5–15)
BUN: 77 mg/dL — ABNORMAL HIGH (ref 6–20)
CALCIUM: 8.7 mg/dL — AB (ref 8.9–10.3)
CO2: 35 mmol/L — ABNORMAL HIGH (ref 22–32)
CREATININE: 2.5 mg/dL — AB (ref 0.44–1.00)
Chloride: 89 mmol/L — ABNORMAL LOW (ref 101–111)
GFR, EST AFRICAN AMERICAN: 21 mL/min — AB (ref >60)
GFR, EST NON AFRICAN AMERICAN: 18 mL/min — AB (ref >60)
Glucose, Bld: 132 mg/dL — ABNORMAL HIGH (ref 65–99)
Phosphorus: 4.7 mg/dL — ABNORMAL HIGH (ref 2.5–4.6)
Potassium: 3.6 mmol/L (ref 3.5–5.1)
SODIUM: 137 mmol/L (ref 135–145)

## 2016-02-16 LAB — HEMOGLOBIN AND HEMATOCRIT, BLOOD
HEMATOCRIT: 23.8 % — AB (ref 36.0–46.0)
Hemoglobin: 7.2 g/dL — ABNORMAL LOW (ref 12.0–15.0)

## 2016-02-16 LAB — IRON AND TIBC
IRON: 24 ug/dL — AB (ref 28–170)
SATURATION RATIOS: 6 % — AB (ref 10.4–31.8)
TIBC: 392 ug/dL (ref 250–450)
UIBC: 368 ug/dL

## 2016-02-16 LAB — GLUCOSE, CAPILLARY: Glucose-Capillary: 66 mg/dL (ref 65–99)

## 2016-02-16 LAB — PROCALCITONIN: Procalcitonin: 0.15 ng/mL

## 2016-02-16 LAB — STREP PNEUMONIAE URINARY ANTIGEN: STREP PNEUMO URINARY ANTIGEN: NEGATIVE

## 2016-02-16 MED ORDER — VANCOMYCIN HCL 1000 MG IV SOLR
INTRAVENOUS | Status: AC
  Filled 2016-02-16: qty 2000

## 2016-02-16 MED ORDER — CHLORHEXIDINE GLUCONATE 0.12% ORAL RINSE (MEDLINE KIT)
15.0000 mL | Freq: Two times a day (BID) | OROMUCOSAL | Status: DC
  Administered 2016-02-16: 15 mL via OROMUCOSAL

## 2016-02-16 MED ORDER — LEVALBUTEROL HCL 0.63 MG/3ML IN NEBU
0.6300 mg | INHALATION_SOLUTION | Freq: Four times a day (QID) | RESPIRATORY_TRACT | Status: DC
  Administered 2016-02-17 – 2016-02-27 (×42): 0.63 mg via RESPIRATORY_TRACT
  Filled 2016-02-16 (×85): qty 3

## 2016-02-16 MED ORDER — ANTISEPTIC ORAL RINSE SOLUTION (CORINZ)
7.0000 mL | Freq: Four times a day (QID) | OROMUCOSAL | Status: DC
  Administered 2016-02-16: 7 mL via OROMUCOSAL

## 2016-02-16 MED ORDER — MIDAZOLAM HCL 50 MG/10ML IJ SOLN
INTRAMUSCULAR | Status: AC
  Filled 2016-02-16: qty 1

## 2016-02-16 MED ORDER — DOCUSATE SODIUM 50 MG/5ML PO LIQD
100.0000 mg | Freq: Two times a day (BID) | ORAL | Status: DC | PRN
  Filled 2016-02-16: qty 10

## 2016-02-16 MED ORDER — FENTANYL CITRATE (PF) 100 MCG/2ML IJ SOLN: 50.0000 ug | INTRAMUSCULAR | Status: DC | PRN

## 2016-02-16 MED ORDER — FUROSEMIDE 10 MG/ML IJ SOLN
60.0000 mg | Freq: Once | INTRAMUSCULAR | Status: AC
  Administered 2016-02-16: 60 mg via INTRAVENOUS
  Filled 2016-02-16: qty 6

## 2016-02-16 MED ORDER — SODIUM CHLORIDE 0.9 % IV SOLN
10.0000 ug/h | INTRAVENOUS | Status: DC
  Administered 2016-02-16: 25 ug/h via INTRAVENOUS
  Filled 2016-02-16: qty 50

## 2016-02-16 MED ORDER — DEXTROSE 50 % IV SOLN
INTRAVENOUS | Status: AC
  Administered 2016-02-16: 25 mL
  Filled 2016-02-16: qty 50

## 2016-02-16 MED ORDER — DEXTROSE 5 % IV SOLN
100.0000 mg | Freq: Once | INTRAVENOUS | Status: AC
  Administered 2016-02-17: 100 mg via INTRAVENOUS
  Filled 2016-02-16: qty 10

## 2016-02-16 MED ORDER — SODIUM CHLORIDE 0.9 % IV SOLN
25.0000 ug/h | INTRAVENOUS | Status: DC
  Administered 2016-02-17: 200 ug/h via INTRAVENOUS
  Administered 2016-02-18: 100 ug/h via INTRAVENOUS
  Filled 2016-02-16 (×3): qty 50

## 2016-02-16 MED ORDER — DEXTROSE 5 % IV SOLN
INTRAVENOUS | Status: AC
  Filled 2016-02-16: qty 1

## 2016-02-16 MED ORDER — ANTISEPTIC ORAL RINSE SOLUTION (CORINZ)
7.0000 mL | Freq: Four times a day (QID) | OROMUCOSAL | Status: DC
  Administered 2016-02-17 – 2016-02-27 (×42): 7 mL via OROMUCOSAL

## 2016-02-16 MED ORDER — SODIUM CHLORIDE 0.9 % IV SOLN: Freq: Once | INTRAVENOUS | Status: DC

## 2016-02-16 MED ORDER — DILTIAZEM 12 MG/ML ORAL SUSPENSION
30.0000 mg | Freq: Four times a day (QID) | ORAL | Status: DC
  Administered 2016-02-16 – 2016-02-21 (×19): 30 mg
  Filled 2016-02-16 (×21): qty 3

## 2016-02-16 MED ORDER — IPRATROPIUM BROMIDE 0.02 % IN SOLN
0.5000 mg | Freq: Four times a day (QID) | RESPIRATORY_TRACT | Status: DC
  Administered 2016-02-17 – 2016-02-27 (×42): 0.5 mg via RESPIRATORY_TRACT
  Filled 2016-02-16 (×43): qty 2.5

## 2016-02-16 MED ORDER — FAMOTIDINE IN NACL 20-0.9 MG/50ML-% IV SOLN
20.0000 mg | Freq: Two times a day (BID) | INTRAVENOUS | Status: DC
  Administered 2016-02-17 (×2): 20 mg via INTRAVENOUS
  Filled 2016-02-16 (×5): qty 50

## 2016-02-16 MED ORDER — ETOMIDATE 2 MG/ML IV SOLN
INTRAVENOUS | Status: DC | PRN
  Administered 2016-02-16: 10 mg via INTRAVENOUS

## 2016-02-16 MED ORDER — MIDAZOLAM BOLUS VIA INFUSION
1.0000 mg | INTRAVENOUS | Status: DC | PRN
  Filled 2016-02-16: qty 4

## 2016-02-16 MED ORDER — MIDAZOLAM HCL 5 MG/ML IJ SOLN
1.0000 mg/h | INTRAMUSCULAR | Status: DC
  Administered 2016-02-17: 6 mg/h via INTRAVENOUS
  Administered 2016-02-18: 2 mg/h via INTRAVENOUS
  Filled 2016-02-16 (×2): qty 10

## 2016-02-16 MED ORDER — FUROSEMIDE 10 MG/ML IJ SOLN
40.0000 mg | Freq: Once | INTRAMUSCULAR | Status: AC
  Administered 2016-02-16: 40 mg via INTRAVENOUS
  Filled 2016-02-16: qty 4

## 2016-02-16 MED ORDER — ACETAZOLAMIDE ER 500 MG PO CP12
500.0000 mg | ORAL_CAPSULE | Freq: Once | ORAL | Status: AC
  Administered 2016-02-17: 500 mg via ORAL
  Filled 2016-02-16 (×2): qty 1

## 2016-02-16 MED ORDER — DEXTROSE 50 % IV SOLN: 25.0000 g | Freq: Once | INTRAVENOUS | Status: DC

## 2016-02-16 MED ORDER — DEXTROSE 5 % IV SOLN
1.0000 g | Freq: Three times a day (TID) | INTRAVENOUS | Status: DC
  Administered 2016-02-16 – 2016-02-20 (×14): 1 g via INTRAVENOUS
  Filled 2016-02-16 (×17): qty 1

## 2016-02-16 MED ORDER — SODIUM CHLORIDE 0.9 % IV SOLN
Freq: Once | INTRAVENOUS | Status: AC
  Administered 2016-02-16: 18:00:00 via INTRAVENOUS

## 2016-02-16 MED ORDER — VANCOMYCIN HCL 10 G IV SOLR
2000.0000 mg | Freq: Once | INTRAVENOUS | Status: AC
  Administered 2016-02-16: 2000 mg via INTRAVENOUS
  Filled 2016-02-16: qty 2000

## 2016-02-16 MED ORDER — ACETAZOLAMIDE ER 500 MG PO CP12
500.0000 mg | ORAL_CAPSULE | Freq: Two times a day (BID) | ORAL | Status: DC
  Filled 2016-02-16 (×3): qty 1

## 2016-02-16 MED ORDER — FENTANYL BOLUS VIA INFUSION
25.0000 ug | INTRAVENOUS | Status: DC | PRN
  Filled 2016-02-16: qty 25

## 2016-02-16 MED ORDER — FENTANYL CITRATE (PF) 2500 MCG/50ML IJ SOLN
INTRAMUSCULAR | Status: AC
  Filled 2016-02-16: qty 50

## 2016-02-16 MED ORDER — ALBUTEROL SULFATE (2.5 MG/3ML) 0.083% IN NEBU
2.5000 mg | INHALATION_SOLUTION | RESPIRATORY_TRACT | Status: DC | PRN
  Administered 2016-02-16 – 2016-02-23 (×2): 2.5 mg via RESPIRATORY_TRACT
  Filled 2016-02-16 (×2): qty 3

## 2016-02-16 MED ORDER — FENTANYL CITRATE (PF) 100 MCG/2ML IJ SOLN
50.0000 ug | Freq: Once | INTRAMUSCULAR | Status: DC
Start: 2016-02-16 — End: 2016-02-18

## 2016-02-16 MED ORDER — ANTISEPTIC ORAL RINSE SOLUTION (CORINZ): 7.0000 mL | Freq: Four times a day (QID) | OROMUCOSAL | Status: DC

## 2016-02-16 MED ORDER — VANCOMYCIN HCL 10 G IV SOLR
1500.0000 mg | INTRAVENOUS | Status: AC
  Administered 2016-02-18 – 2016-02-22 (×3): 1500 mg via INTRAVENOUS
  Filled 2016-02-16 (×3): qty 1500

## 2016-02-16 MED ORDER — SODIUM CHLORIDE 0.9 % IV SOLN
250.0000 mL | INTRAVENOUS | Status: DC | PRN
  Administered 2016-02-20: 18:00:00 via INTRAVENOUS
  Administered 2016-02-23: 500 mL via INTRAVENOUS

## 2016-02-16 MED ORDER — CHLORHEXIDINE GLUCONATE 0.12% ORAL RINSE (MEDLINE KIT)
15.0000 mL | Freq: Two times a day (BID) | OROMUCOSAL | Status: DC
  Administered 2016-02-16 – 2016-02-27 (×22): 15 mL via OROMUCOSAL

## 2016-02-16 MED ORDER — DEXTROSE 5 % IV SOLN
2.0000 g | Freq: Three times a day (TID) | INTRAVENOUS | Status: DC
  Filled 2016-02-16 (×2): qty 2

## 2016-02-16 MED ORDER — SODIUM CHLORIDE 0.9 % IV SOLN
1.0000 mg/h | INTRAVENOUS | Status: DC
  Administered 2016-02-16: 5 mg/h via INTRAVENOUS
  Administered 2016-02-16: 1 mg/h via INTRAVENOUS
  Filled 2016-02-16 (×2): qty 10

## 2016-02-16 NOTE — Progress Notes (Signed)
Pt appeared to be experiencing respiratory distress with labored breathing, RR of 24, o2 sat of 92% on 6 liters of o2 via nasal cannula, BP of 144/45 and heart rate of 77. [facility] was notified, chest xray was ordered, 60 mg of lasix was ordered and administered, and ABG was obtained. Pt was also placed on a non-rebreather, o2 level is now 100%, BP 135/94, HR 81, RR 22.

## 2016-02-16 NOTE — Progress Notes (Signed)
Responded to a page requesting evaluation of acute respiratory distress. [name] is found to be in significant respiratory distress with tachypnea and accessory muscle recruitment. She is saturating adequately on NRB.   A CXR and ABG had just been obtained and a 60 mg IVP Lasix had been given. CXR features diffuse bilateral opacities concerning for interval development of PNA and possibly ARDS. ABG 7.345/60/166/37. She is afebrile and without leukocytosis on the morning CBC, but given her critical illness, decision was made to initiate empiric abx. Blood and sputum cultures requested. Will check PCT and trend. Treating as HCAP with vancomycin and Azactam in setting of penicillin allergy. She will be transferred to ICU with BiPAP.

## 2016-02-16 NOTE — Progress Notes (Signed)
Pt left facility via [facility], sedated with versed and fentanyl on vent. 3rd unit of blood infusing. Report given to [id number] RN.

## 2016-02-16 NOTE — Anesthesia Procedure Notes (Signed)
Procedure Name: Intubation Date/Time: 02/16/2016 1:26 PM Performed by: Franco NonesYATES, Trevel Dillenbeck S Pre-anesthesia Checklist: Patient identified, Emergency Drugs available, Suction available, Patient being monitored and Timeout performed Patient Re-evaluated:Patient Re-evaluated prior to inductionOxygen Delivery Method: Ambu bag Preoxygenation: Pre-oxygenation with 100% oxygen Intubation Type: IV induction Ventilation: Mask ventilation without difficulty Laryngoscope Size: Mac and 3 Grade View: Grade I Tube type: Subglottic suction tube Tube size: 7.5 mm Number of attempts: 1 ETT to lip (cm): tube securement by repiratory. Dental Injury: Teeth and Oropharynx as per pre-operative assessment

## 2016-02-16 NOTE — Progress Notes (Signed)
Follow-up to patient post ABG; patient was placed on NREBRE and Lasix was given. Patient still continued to have SOB. Patient was transferred to ICU and placed on BIPAP 12/6 45% RR 10. Patient will continue to be monitored for any changes.

## 2016-02-16 NOTE — Progress Notes (Signed)
Pt was evaluated by Dr Antionette Charpyd and orders were placed to transfer pt to ICU to begin abx treatment and BiPAP. Report was called and given to [employer] Fred. Pt was transferred via bed on O2 by nurse and nurse tech. Family members to be notified of pt's recent transfer.

## 2016-02-16 NOTE — Addendum Note (Signed)
Addendum  created 02/16/16 1343 by Franco Noneseresa S Liona Wengert, CRNA   Modules edited: Charges VN

## 2016-02-16 NOTE — Progress Notes (Addendum)
ANTIBIOTIC CONSULT NOTE-Preliminary  Pharmacy Consult for Vancomycin Indication: Pneumonia  Allergies  Allergen Reactions  . Bee Venom Swelling  . Doxycycline     Hoarseness / throat irritation  . Latex Rash  . Penicillins Rash    Patient Measurements: Height:  (157.5 cm) Weight: 229 lb 0.9 oz (103.9 kg) IBW/kg (Calculated) : 50.1   Vital Signs: Temp: 98 F (36.7 C) (06/15 0103) Temp Source: Oral (06/15 0103) BP: 135/94 mmHg (06/15 0103) Pulse Rate: 81 (06/15 0103)  Labs:  Recent Labs  02/13/16 0446 02/13/16 1030 02/14/16 0415 02/15/16 0420  WBC 7.4 7.3  --  8.8  HGB 7.0* 7.2*  --  7.0*  PLT 214 187  --  199  CREATININE 3.18*  --  2.86* 2.51*    Estimated Creatinine Clearance: 22.6 mL/min (by C-G formula based on Cr of 2.51).  No results for input(s): VANCOTROUGH, VANCOPEAK, VANCORANDOM, GENTTROUGH, GENTPEAK, GENTRANDOM, TOBRATROUGH, TOBRAPEAK, TOBRARND, AMIKACINPEAK, AMIKACINTROU, AMIKACIN in the last 72 hours.   Microbiology: Recent Results (from the past 720 hour(s))  MRSA PCR Screening     Status: Abnormal   Collection Time: 02-26-16 11:38 AM  Result Value Ref Range Status   MRSA by PCR POSITIVE (A) NEGATIVE Final    Comment: RESULT CALLED TO, READ BACK BY AND VERIFIED WITH: SCHOENEWITZ L. AT 1541 ON [id number] BY THOMPSON S.        The GeneXpert MRSA Assay (FDA approved for NASAL specimens only), is one component of a comprehensive MRSA colonization surveillance program. It is not intended to diagnose MRSA infection nor to guide or monitor treatment for MRSA infections.     Medical History: Past Medical History  Diagnosis Date  . Arthritis   . Chronic atrial fibrillation (HCC)     a. previously on Pradaxa - on Coumadin since valve surgery.  . Aortic stenosis     a. Severe - s/p pericardial AVR with MAZE 02/04/15.  Marland Kitchen Pneumonia 09/2014  . History of bronchitis 2015  . Family history of adverse reaction to anesthesia     Uncle with MH in  the 60's,cousin in the 35's with MH  . 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 COPD (chronic obstructive pulmonary disease) (HCC)   . Normal coronary arteries     a. By cath 11/2014.  . Pulmonary hypertension (HCC)     a. Mod by cath 11/2014.  Marland Kitchen Chronic diastolic CHF (congestive heart failure) (HCC)   . Vocal cord paralysis     a. Dx post-AVR in 01/2015. Required PANDA, intubation during that admission.  . Pleural effusion   . AKI (acute kidney injury) (HCC)     a. H/o AKI after AVR in 01/2015.  . Tracheomalacia   . Anemia     a. 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    Medications:  Aztreonam 1 Gm IV every 8 hours  Assessment: 75 yo female admitted 02-26-16 with SOB, AKI on CKD; acute on chronic diastolic heart failure, and anemia. Pt now with increased SOB; chest xray shows pneumonia. Empiric antibiotics.  Goal of Therapy:  Vancomycin troughs 15-20 mcg/ml  Plan:  Preliminary review of pertinent patient information completed.  Protocol will be  initiated with a one-time dose of Vancomycin 2000 mg IV.  Jeani HawkingAnnie Penn clinical pharmacist will complete review during morning rounds to assess patient and finalize treatment regimen.  Arelia SneddonMason, Mary Anne, K[facility] HospitalRPH 02/16/2016,2:05 AM Addum:  Cont vancomycin 1500 mg IV q48 hours.  F/u renal function, cultures and clinical course.  Check vanc trough when appropriate.

## 2016-02-16 NOTE — Progress Notes (Signed)
MEDICATION RELATED CONSULT NOTE - INITIAL   Pharmacy Consult: Renal adjustment of antibiotics Indication: AKI on CKD  Allergies  Allergen Reactions  . Bee Venom Swelling  . Doxycycline     Hoarseness / throat irritation  . Latex Rash  . Penicillins Rash    Patient Measurements: Height:  (157.5 cm) Weight: 229 lb 0.9 oz (103.9 kg) IBW/kg (Calculated) : 50.1 Adjusted Body Weight:   Vital Signs: Temp: 98 F (36.7 C) (06/15 0103) Temp Source: Oral (06/15 0103) BP: 135/94 mmHg (06/15 0103) Pulse Rate: 81 (06/15 0103) Intake/Output from previous day: 06/14 0701 - 06/15 0700 In: 360 [P.O.:360] Out: 3200 [Urine:3200] Intake/Output from this shift: Total I/O In: -  Out: 600 [Urine:600]  Labs:  Recent Labs  02/13/16 0446 02/13/16 1030 02/14/16 0415 02/15/16 0420  WBC 7.4 7.3  --  8.8  HGB 7.0* 7.2*  --  7.0*  HCT 23.8* 24.4*  --  23.9*  PLT 214 187  --  199  CREATININE 3.18*  --  2.86* 2.51*  PHOS  --   --  4.9* 4.0  ALBUMIN 3.2*  --  3.1* 3.4*  PROT 6.8  --   --   --   AST 28  --   --   --   ALT 13*  --   --   --   ALKPHOS 99  --   --   --   BILITOT 1.5*  --   --   --    Estimated Creatinine Clearance: 22.6 mL/min (by C-G formula based on Cr of 2.51).   Microbiology: Recent Results (from the past 720 hour(s))  MRSA PCR Screening     Status: Abnormal   Collection Time: 02/19/2016 11:38 AM  Result Value Ref Range Status   MRSA by PCR POSITIVE (A) NEGATIVE Final    Comment: RESULT CALLED TO, READ BACK BY AND VERIFIED WITH: SCHOENEWITZ L. AT 1541 ON [id number] BY THOMPSON S.        The GeneXpert MRSA Assay (FDA approved for NASAL specimens only), is one component of a comprehensive MRSA colonization surveillance program. It is not intended to diagnose MRSA infection nor to guide or monitor treatment for MRSA infections.     Medical History: Past Medical History  Diagnosis Date  . Arthritis   . Chronic atrial fibrillation (HCC)     a. previously  on Pradaxa - on Coumadin since valve surgery.  . Aortic stenosis     a. Severe - s/p pericardial AVR with MAZE 02/04/15.  Marland Kitchen Pneumonia 09/2014  . History of bronchitis 2015  . Family history of adverse reaction to anesthesia     Uncle with MH in the 60's,cousin in the 10's with MH  . 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 COPD (chronic obstructive pulmonary disease) (HCC)   . Normal coronary arteries     a. By cath 11/2014.  . Pulmonary hypertension (HCC)     a. Mod by cath 11/2014.  Marland Kitchen Chronic diastolic CHF (congestive heart failure) (HCC)   . Vocal cord paralysis     a. Dx post-AVR in 01/2015. Required PANDA, intubation during that admission.  . Pleural effusion   .  AKI (acute kidney injury) (HCC)     a. H/o AKI after AVR in 01/2015.  . Tracheomalacia   . Anemia     a. 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    Medications:  Aztreonam 2 Gm IV every 8 hours  Assessment:  75 yo female admitted 01-18-16 with AKI on CKD; now with new SOB and pneumonia. Pharmacy has been asked to renally adjust her dose of Aztreonam.  Plan:  Based on her estimated CrCl of <30 ml/min, Aztreonam dose was reduced to 1 Gm IV every 8 hours. Pharmacy will continue to follow as her AKI resolves and her SCr returns to baseline.  Arelia SneddonMason, Denissa Cozart Anne 02/16/2016,2:15 AM

## 2016-02-16 NOTE — Progress Notes (Signed)
Subjective: Interval History: Patient is sleepy but arousable and offers no complains. Objective: Vital signs in last 24 hours: Temp:  [97.1 F (36.2 C)-98.4 F (36.9 C)] 97.3 F (36.3 C) (06/15 0552) Pulse Rate:  [37-87] 75 (06/15 0630) Resp:  [18-33] 24 (06/15 0630) BP: (99-166)/(42-94) 127/61 mmHg (06/15 0630) SpO2:  [90 %-100 %] 100 % (06/15 0630) FiO2 (%):  [45 %] 45 % (06/15 0630) Weight:  [107.6 kg (237 lb 3.4 oz)] 107.6 kg (237 lb 3.4 oz) (06/15 0300) Weight change:   Intake/Output from previous day: 06/14 0701 - 06/15 0700 In: 910 [P.O.:360; IV Piggyback:550] Out: 3450 [Urine:3450] Intake/Output this shift:    Generally : Patient is very sleepy today. She is arousable but unable to answer questions. She is on BiPAP and seems to be very comfortable. Chest bilateral inspiratory crackles and wheezing Heart Irrigular rate and rhythm, III/VI systolic murmur Abdomen: Obese Extremities: She has 2-3+ edema/venous stasis with chronic changes.  Lab Results:  Recent Labs  02/15/16 0420 02/16/16 0213  WBC 8.8 10.6*  HGB 7.0* 6.7*  HCT 23.9* 23.0*  PLT 199 222   BMET:   Recent Labs  02/15/16 0420 02/16/16 0213  NA 134* 137  K 3.2* 3.6  CL 88* 89*  CO2 36* 35*  GLUCOSE 95 132*  BUN 77* 77*  CREATININE 2.51* 2.50*  CALCIUM 8.6* 8.7*   No results for input(s): PTH in the last 72 hours. Iron Studies:   Recent Labs  02/14/16 1014  FERRITIN 54    Studies/Results: Dg Chest Port 1 View  02/16/2016  CLINICAL DATA:  Acute onset of shortness of breath and wheezing. Initial encounter. EXAM: PORTABLE CHEST 1 VIEW COMPARISON:  Chest radiograph from April 22, 2016 FINDINGS: Fluffy bilateral airspace opacification is noted, worsened from the prior study, with small bilateral pleural effusions. This is concerning for significant pneumonia. Pulmonary edema is considered less likely. ARDS cannot be excluded. No pneumothorax is seen. The cardiomediastinal silhouette is mildly  enlarged. The patient is status post median sternotomy. A valve replacement is noted. Mild degenerative change is noted at the glenohumeral joints. No acute osseous abnormalities are seen. IMPRESSION: 1. Fluffy bilateral airspace opacification, worsened from prior study, with small bilateral pleural effusions. This is concerning for significant pneumonia. Pulmonary edema is considered less likely. ARDS cannot be excluded. 2. Mild cardiomegaly. 3. Mild degenerative change at the glenohumeral joints. Electronically Signed   By: Roanna RaiderJeffery  Chang M.D.   On: 02/16/2016 00:29    I have reviewed the patient's current medications.  Assessment/Plan: Problem #1 acute kidney injury superimposed on chronic. Her renal function is stable.  Problem #2 hypokalemia:. Patient is on potassium supplement. Her potassium has corrected. Problem #3 anasarca: Patient presently on metolazone and Lasix. Dose of Lasix was increased yesterday because of difficulty breathing. Patient had 3400 mL of urine output the last 24 hours. Patient however has still significant edema. Problem #4 anemia: Her hemoglobin remains low. Seems to be possibly iron deficiency anemia. Ferritin is low but iron saturation is pending. Presently patient is receiving blood transfusion. Problem #5: Difficulty breathing. Presently multifactorial. Including CHF/pleural effusion/asthmatic bronchitis. Presently she is on BiPAP seems to be more comfortable. Problem #6 metabolic bone disease: Calcium and phosphorus is in range. Problem #7 history of aortic valve replacement Problem #8 metabolic alkalosis: Her pH was 7.43, PCO2 of 58 .9, CO2 of 36. This is possibly secondary to post-hypercapnic metabolic alkalosis. Plan: 1]We'll continue his present management 2] we'll check her renal panel  in the  morning. 3] we'll DC metolazone 4] we'll start patient on Diamox 500 mg by mouth twice a day.    LOS: 4 days   Leonela Kivi S 02/16/2016,7:41 AM

## 2016-02-16 NOTE — Consult Note (Signed)
PULMONARY / CRITICAL CARE MEDICINE   Name: Sandra Turner MRN: [id number] DOB: 1940-11-30    ADMISSION DATE:  02/08/2016 CONSULTATION DATE:  02/16/2016  REFERRING MD:  Christy Gentles, M.D. / [facility]  CHIEF COMPLAINT:  Shortness of breath  HISTORY OF PRESENT ILLNESS:   This is a 75 year old female well known to the [place] pulmonary and critical care service who comes to [facility] on 02/29/2016 in the setting of dyspnea. She was admitted that day and was noted to have acute kidney injury and a low hemoglobin value. She was admitted by the hospitalist service and nephrology was consulted. She was administered packed red blood cells and Lasix was given as well. Despite these measures she had ongoing respiratory distress with hypoxemia. She ultimately required intubation on June 15. Pulmonary and critical care medicine in [facility] was asked to take the patient on transfer because of no pulmonary and critical care support at [facility].  PAST MEDICAL HISTORY :  Past Medical History  Diagnosis Date  . Arthritis   . Chronic atrial fibrillation (HCC)     a. previously on Pradaxa - on Coumadin since valve surgery.  . Aortic stenosis     a. Severe - s/p pericardial AVR with MAZE 02/04/15.  Marland Kitchen Pneumonia 09/2014  . History of bronchitis 2015  . Family history of adverse reaction to anesthesia     Uncle with MH in the 60's,cousin in the 23's with MH  . 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 COPD (chronic obstructive pulmonary disease) (HCC)   . Normal coronary arteries     a. By cath 11/2014.  . Pulmonary hypertension (HCC)     a. Mod by cath  11/2014.  Marland Kitchen Chronic diastolic CHF (congestive heart failure) (HCC)   . Vocal cord paralysis     a. Dx post-AVR in 01/2015. Required PANDA, intubation during that admission.  . Pleural effusion   . AKI (acute kidney injury) (HCC)     a. H/o AKI after AVR in 01/2015.  . Tracheomalacia   . Anemia     a. 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    PAST SURGICAL HISTORY: Past Surgical History  Procedure Laterality Date  . None    . Left and right heart catheterization with coronary angiogram N/A 11/01/2014    Procedure: LEFT AND RIGHT HEART CATHETERIZATION WITH CORONARY ANGIOGRAM;  Surgeon: [name], MD;  Location: [facility] CATH LAB;  Service: Cardiovascular;  Laterality: N/A;  . Aortic valve replacement N/A 01/19/2015    Procedure: AORTIC VALVE REPLACEMENT (AVR);  Surgeon: Purcell Nails, MD;  Location: [facility] OR;  Service: Open Heart Surgery;  Laterality: N/A;  . Maze N/A 01/19/2015    Procedure: MAZE;  Surgeon: Purcell Nails, MD;  Location: [facility] OR;  Service: Open Heart Surgery;  Laterality: N/A;  . Aortic root enlargement N/A 01/19/2015    Procedure:  AORTIC ROOT ENLARGEMENT;  Surgeon: Purcell Nails, MD;  Location: [facility] OR;  Service: Open Heart Surgery;  Laterality: N/A;  . Tee without cardioversion N/A 01/19/2015    Procedure: TRANSESOPHAGEAL ECHOCARDIOGRAM (TEE);  Surgeon: Purcell Nails, MD;  Location: [facility] OR;  Service: Open Heart  Surgery;  Laterality: N/A;     Allergies  Allergen Reactions  . Bee Venom Swelling  . Doxycycline     Hoarseness / throat irritation  . Latex Rash  . Penicillins Rash    No current facility-administered medications on file prior to encounter.   Current Outpatient Prescriptions on File Prior to Encounter  Medication Sig  . albuterol (PROAIR HFA) 108 (90 BASE) MCG/ACT inhaler Inhale 2 puffs into the lungs 4 (four) times daily as needed for wheezing or shortness of breath.  Marland Kitchen. albuterol (PROVENTIL) (2.5 MG/3ML) 0.083% nebulizer solution Take 3 mLs (2.5 mg total)  by nebulization every 4 (four) hours as needed for wheezing or shortness of breath. (Patient taking differently: Take 2.5 mg by nebulization 4 (four) times daily. )  . budesonide (PULMICORT) 0.25 MG/2ML nebulizer solution Take 2 mLs (0.25 mg total) by nebulization 2 (two) times daily.  Marland Kitchen. CARTIA XT 180 MG 24 hr capsule Take 180 mg by mouth daily.   . Coenzyme Q10 (CO Q-10) 100 MG CAPS Take by mouth daily.  Marland Kitchen. docusate sodium (COLACE) 100 MG capsule Take 1 capsule (100 mg total) by mouth daily as needed for mild constipation.  Marland Kitchen. FLOVENT DISKUS 250 MCG/BLIST AEPB INHALE ONE PUFF BY MOUTH TWICE DAILY  . magnesium oxide (MAG-OX) 400 MG tablet Take 400 mg by mouth 2 (two) times daily.  . metolazone (ZAROXOLYN) 2.5 MG tablet TAKE ONE TABLET BY MOUTH ONCE DAILY  . OXYGEN Inhale into the lungs. 2.5 lpm with exertion  . Potassium Chloride ER 20 MEQ TBCR Take 2 tablets (40 meq) daily  . PRADAXA 150 MG CAPS capsule TAKE ONE CAPSULE BY MOUTH TWICE DAILY  . torsemide (DEMADEX) 20 MG tablet Take 2 tablets (40 mg total) by mouth 2 (two) times daily.  . traMADol (ULTRAM) 50 MG tablet Take 1 tablet (50 mg total) by mouth every 6 (six) hours as needed (pain).    FAMILY HISTORY:  Family History  Problem Relation Age of Onset  . Breast cancer Mother   . Congestive Heart Failure Mother   . Cancer Father     ? Lymphoma  . Malignant hyperthermia Cousin     SOCIAL HISTORY: Social History   Social History  . Marital Status: Divorced    Spouse Name: N/A  . Number of Children: 0  . Years of Education: N/A   Occupational History  . Hairdresser    Social History Main Topics  . Smoking status: Never Smoker   . Smokeless tobacco: Never Used  . Alcohol Use: No  . Drug Use: No  . Sexual Activity: No   Other Topics Concern  . None   Social History Narrative    REVIEW OF SYSTEMS:  Unable to obtain secondary to intubation.  SUBJECTIVE:  As above.  VITAL SIGNS: BP 113/36 mmHg  Pulse 76  Temp(Src)  98.3 F (36.8 C) (Axillary)  Resp 23  Ht 5\' 2"  (1.575 m)  Wt 107.6 kg (237 lb 3.4 oz)  BMI 43.38 kg/m2  SpO2 100%  HEMODYNAMICS:    VENTILATOR SETTINGS: Vent Mode:  [-] PRVC FiO2 (%):  [40 %-50 %] 50 % Set Rate:  [15 bmp] 15 bmp Vt Set:  [400 mL-440 mL] 400 mL PEEP:  [5 cmH20] 5 cmH20 Plateau Pressure:  [22 cmH20] 22 cmH20  INTAKE / OUTPUT: I/O last 3 completed shifts: In: 910 [P.O.:360; IV Piggyback:550] Out: 3450 [Urine:3450]  PHYSICAL EXAMINATION: General:  Sedated. No acute distress. No family at bedside.  Integument:  Warm & dry. No rash on exposed skin. Bruising on bilateral upper extremities. Scaling dermis in lower extremities. Lymphatics:  No appreciated cervical or supraclavicular lymphadenoapthy with body habitus. HEENT:  No scleral injection or icterus. Endotracheal tube in place.  Cardiovascular:  Regular rate. Anasarca. No appreciable JVD.  Pulmonary:  Coarse breath sounds on auscultation bilaterally. Symmetric chest wall rise on ventilator. Abdomen: Soft. Normal bowel sounds. Protuberant.  Musculoskeletal:  Normal bulk. No joint deformity or effusion appreciated. Neurological:  No meningismus. Sedated. Withdraws to pain in extremities. PERRL. Psychiatric:   Unable to assess given intubation and sedation.  LABS:  BMET  Recent Labs Lab 02/14/16 0415 02/15/16 0420 02/16/16 0213  NA 136 134* 137  K 3.6 3.2* 3.6  CL 89* 88* 89*  CO2 36* 36* 35*  BUN 80* 77* 77*  CREATININE 2.86* 2.51* 2.50*  GLUCOSE 101* 95 132*    Electrolytes  Recent Labs Lab 02/14/16 0415 02/15/16 0420 02/16/16 0213  CALCIUM 8.4* 8.6* 8.7*  PHOS 4.9* 4.0 4.7*    CBC  Recent Labs Lab 02/13/16 1030 02/15/16 0420 02/16/16 0213 02/16/16 0912  WBC 7.3 8.8 10.6*  --   HGB 7.2* 7.0* 6.7* 7.2*  HCT 24.4* 23.9* 23.0* 23.8*  PLT 187 199 222  --     Coag's  Recent Labs Lab 16-Feb-2016 0755 16-Feb-2016 0915 02/13/16 0446  INR 7.12* 6.68* 6.12*    Sepsis  Markers  Recent Labs Lab 02/16/16 0213  PROCALCITON 0.15    ABG  Recent Labs Lab 02/16/16 0029 02/16/16 1235 02/16/16 1500  PHART 7.435 7.475* 7.529*  PCO2ART 58.9* 55.8* 48.3*  PO2ART 166.0* 58.6* 72.3*    Liver Enzymes  Recent Labs Lab 02-16-2016 0755 02-16-16 0915 02/13/16 0446 02/14/16 0415 02/15/16 0420 02/16/16 0213  AST 24 27 28   --   --   --   ALT 14 15 13*  --   --   --   ALKPHOS 108 108 99  --   --   --   BILITOT 1.2 1.2 1.5*  --   --   --   ALBUMIN 3.3* 3.5 3.2* 3.1* 3.4* 3.4*    Cardiac Enzymes  Recent Labs Lab 16-Feb-2016 0755  TROPONINI 0.03    Glucose No results for input(s): GLUCAP in the last 168 hours.  Imaging Dg Chest Port 1 View  02/16/2016  CLINICAL DATA:  Intubated EXAM: PORTABLE CHEST 1 VIEW COMPARISON:  02/16/2016 FINDINGS: Cardiomediastinal silhouette is stable. There is NG tube in place with tip 2.2 cm above the carina. NG tube in place. Cardiomegaly again noted. Again noted bilateral patchy airspace is right greater than left highly suspicious for bilateral pneumonia or ARDS. Bilateral small pleural effusion with bilateral basilar atelectasis or infiltrate. Again noted status post median sternotomy and cardiac valve replacement. No pneumothorax. IMPRESSION: Endotracheal and NG tube in place. No pneumothorax. Again noted bilateral patchy airspace disease right greater than left highly suspicious for bilateral pneumonia or ARDS. Bilateral small pleural effusion with bilateral basilar atelectasis or infiltrate. Electronically Signed   By: Natasha Mead M.D.   On: 02/16/2016 14:17   Dg Chest Port 1 View  02/16/2016  CLINICAL DATA:  Acute onset of shortness of breath and wheezing. Initial encounter. EXAM: PORTABLE CHEST 1 VIEW COMPARISON:  Chest radiograph from 02-16-2016 FINDINGS: Fluffy bilateral airspace opacification is noted, worsened from the prior study, with small bilateral pleural effusions. This is concerning for significant pneumonia.  Pulmonary edema is considered less likely. ARDS cannot be excluded. No  pneumothorax is seen. The cardiomediastinal silhouette is mildly enlarged. The patient is status post median sternotomy. A valve replacement is noted. Mild degenerative change is noted at the glenohumeral joints. No acute osseous abnormalities are seen. IMPRESSION: 1. Fluffy bilateral airspace opacification, worsened from prior study, with small bilateral pleural effusions. This is concerning for significant pneumonia. Pulmonary edema is considered less likely. ARDS cannot be excluded. 2. Mild cardiomegaly. 3. Mild degenerative change at the glenohumeral joints. Electronically Signed   By: Roanna Raider M.D.   On: 02/16/2016 00:29   Dg Chest Port 1v Same Day  02/16/2016  CLINICAL DATA:  Status post PICC line EXAM: PORTABLE CHEST 1 VIEW COMPARISON:  02/16/2016 FINDINGS: New PICC line is noted on the right with the tip at the cavoatrial junction. Postsurgical changes are again seen. An endotracheal tube and nasogastric catheter are noted in satisfactory position. Diffuse bilateral patchy airspace disease is again identified. Small bilateral pleural effusions are noted. IMPRESSION: PICC line in satisfactory position. The remainder of the exam is stable. Electronically Signed   By: Alcide Clever M.D.   On: 02/16/2016 17:28     STUDIES:  TTE (03/16/15): LV EF 55-60, mildly calcified mitral valve, bioprosthetic aortic valve functioning well but poorly visualized, right ventricle moderately dilated, RVSP estimate 44 mmHg, right atrium severely dilated, mild to moderate tricuspid regurgitation PFT (04/15/15): FVC 0.96 L (36%) FEV1 0.74 L (36%) FEV1/FVC 0.76 no bronchodilator response TLC 2.90 L (61%) DLCO uncorrected 40% CXR PA/LAT 6/11: Silhouetting of bilateral hemidiaphragms with lower lobe opacification suggestive of pleural effusions. PORT CXR 6/15: Patchy bilateral alveolar opacities now involving the upper lung zones. Improved  visualization of hemidiaphragms bilaterally. Endotracheal tube & orogastric tube in good position. Right PICC line with tip in SVC. Blunting of right costocardiac angle suggestive of pleural effusion.  MICROBIOLOGY: MRSA PCR 6/11:  Positive Blood Ctx x2 6/15 >> Tracheal Asp Ctx 6/15 >> Urine Strep Ag 6/15:  Negative   ANTIBIOTICS: Aztreonam 6/15 >> Vancomycin 6/15 >>  SIGNIFICANT EVENTS: 6/11 - Admission to [facility] 6/11 - Transfused 2u PRBC 6/15 - Transfused 3u PRBC 6/15 - Intubation for respiratory failure and transferred to [facility]  LINES/TUBES: OETT 7.5 6/15 >> OGT 6/15 >> FOLEY 6/11>> R TL PICC 6/15 >> PIV x2  DISCUSSION: This is a 75 year old female with a past medical history significant for aortic valve replacement in 2016 complicated by recurrent episodes of respiratory failure with hypoxemia due to mucus plugging and underlying severe asthma who returned to the hospital on 02/16/2016 in the setting of acute kidney injury, anemia, and acute respiratory failure with hypoxemia. She has a recent history of recurrent episodes of diastolic heart failure requiring multiple hospitalizations. The differential diagnosis of her acute respiratory failure with hypoxemia includes cardiogenic pulmonary edema given her recent history, however concern for healthcare associated pneumonia or lung injury related to blood transfusion is reasonable as her chest x-ray is suggestive of ARDS and her condition worsen despite diuresis.  ASSESSMENT / PLAN:  PULMONARY A: Acute Respiratory Failure with Hypoxia - Initially pulmonary edema. Now TRALI vs HCAP. ARDS Possible HCAP H/O Asthma H/O Tracheomalacia  P:   Full Vent Support w/ PEEP for recruitment Daily SBT & WUA ABG & Port CXR  D/C Pulmicort Xopenex & Atrovent Nebs q6hr  CARDIOVASCULAR A:  Chronic Diastolic CHF H/O Atrial fibrillation - S/P MAZE 2016 H/O Aortic Stenosis - S/P bioprosthetic aortic valve.  P:  Telemetry  monitoring  Vitals per unit protocol Holding Pradaxa Heparin  gtt per pharmacy protocol Change to Diltizem  VT q6hr Lasix  IV x1 in AM Diamox  VT x1 in AM  RENAL A:   Acute on Chronic Renal Failure - Improving with diuresis. Metabolic Akalosis  P:   Trending UOP with Foley Monitoring electrolytes & renal function daily Replace electrolytes as needed  GASTROINTESTINAL A:   No acute issues.  P:   NPO Pepcid IV q12hr Dietary Consult to initiate tube feedings  HEMATOLOGIC A:   Chronic Anticoagulation - Pradaxa for A fib. Anemia - No obvious bleeding source. S/P 2u PRBC 6/11 & 3u PRBC 6/15.  P:  Trending cell counts daily w/ CBC CBC post last transfusion SCDs Transfuse for Hgb <7.0 F/U Hemoccult Stool CT Abdomen/Pelvis w/o   INFECTIOUS A:   HCAP  P:   Empiric Vancomycin & Aztreonam Day #1 Awaiting blood & tracheal aspirate cultures May require bronchoscopy for sampling Trending Procalcitonin per algorithm Checking Urine Legionella Antigen  ENDOCRINE A:   Hyperglycemia - Mild.    P:   Monitor glucose on daily labs.  NEUROLOGIC A:   Sedation on Ventilator.  P:   RASS goal:  0 to -1 Fentanyl gtt & IV prn Versed gtt & IV prn  FAMILY  - Updates: Julaine Fusi Horton updated via phone 6/15 & she is to contact family.  - Inter-disciplinary family meet or Palliative Care meeting due by:  6/22  I have spent a total of 39 minutes of critical care time today contacting Ms. Horton, caring for the patient and reviewing the patient's electronic medical record.   Donna Christen Jamison Neighbor, M.D. [facility] Pulmonary & Critical Care Pager:  [phone] After 3pm or if no response, call [phone] 02/16/2016, 6:28 PM

## 2016-02-16 NOTE — Care Management Note (Signed)
Case Management Note  Patient Details  Name: Sandra Turner MRN: [id number] Date of Birth: 1941/01/08  Subjective/Objective:                  Pt admitted with AKI. Pt lives alone and says she has a sister and neighbors who provide support. Pt is ind with ADL's and has a walker she uses as needed for ambulation. Pt has home O2 and neb, she uses 3.5L at baseline. [facility], anticipate pt will need HH services, possible SNF.   Action/Plan: Will need PT eval when appropriate, will cont to follow for DC planning.   Expected Discharge Date:     02/18/2016             Expected Discharge Plan:  Home w Home Health Services  In-House Referral:  NA  Discharge planning Services  CM Consult  Post Acute Care Choice:  Home Health Choice offered to:     DME Arranged:    DME Agency:     HH Arranged:    HH Agency:     Status of Service:  In process, will continue to follow  Medicare Important Message Given:  Yes Date Medicare IM Given:    Medicare IM give by:    Date Additional Medicare IM Given:    Additional Medicare Important Message give by:     If discussed at Long Length of Stay Meetings, dates discussed:    Additional Comments:  Malcolm MetroChildress, Shai Mckenzie Demske, RN 02/16/2016, 7:15 AM

## 2016-02-16 NOTE — Progress Notes (Signed)
Entered pt's room and non rebreathing mask was off of pt's face. O2 sats was 83%, mask was placed back on pt and MD was notified.

## 2016-02-16 NOTE — Progress Notes (Signed)
PROGRESS NOTE    Sandra Turner  OAC:[id number] DOB: [birth year] DOA: 02/23/2016 PCP: Celedonio Savage, MD     Brief Narrative:  75 year old woman admitted on 6/11 with complaints of shortness of breath. She was found to have acute CHF, hemoglobin of 5.4 and a creatinine of 3. She was admitted for evaluation and management she has been transfused a total of 2 units of PRBCs. Nephrology has been on board and is assisting with Lasix dosing. On the night of 6/14 she developed acute respiratory distress with hypoxemia. Chest x-ray shows fluffy bilateral airspace opacification concerning for pneumonia, ARDS is not excluded. She was transferred to the ICU and placed on BiPAP. ABG shows significant hypoxemia despite BiPAP and patient is becoming more unresponsive, decision has been made to mechanically intubate and ventilate patient as of 6/15.   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  -Is failing BiPAP, will proceed to intubate and mechanically ventilate now.  Acute on chronic kidney disease stage IV -Creatinine is currently very close to her baseline of 2.3. -Acute component is probably secondary to cardiorenal syndrome. -Appreciate nephrology's input in regards to Lasix dosing.  Acute on chronic diastolic CHF -Echo from 0/10: Ejection fraction of 55-60% with mild LVH. -She is 4.5 L negative since admission. -Remains markedly volume overloaded on exam. -Nephrology has discontinued metolazone and started Diamox in  addition to Lasix.  Atrial fibrillation -Anticoagulated on Pradaxa.  Status post tissue aortic valve replacement  Anemia of chronic disease -Due to chronic kidney disease. -Has received a total of 2 units of PRBCs this admission. -Hemoglobin is 6.7. -Will transfuse another 2 units with Lasix dosing in between.   DVT prophylaxis: on pradaxa Code Status: Full code Family Communication: Discussed with neighbor over phone, patient without immediate family members. Disposition Plan: May need transfer to [facility] for ventilator assistance.  Consultants:   Nephrology  Procedures:   None  Antimicrobials:   None    Subjective: Overnight events noted. Patient is basically unresponsive, ABG shows significant hypoxemia. Will proceed with intubation immediately.  Objective: Filed Vitals:   02/16/16 1042 02/16/16 1045 02/16/16 1100 02/16/16 1219  BP: 99/80 110/27 119/92   Pulse: 69 65 79 81  Temp:      TempSrc:      Resp: '20 20 20 25  '$ Height:      Weight:      SpO2: 100% 99% 99% 96%    Intake/Output Summary (Last 24 hours) at 02/16/16 1259 Last data filed at 02/16/16 0745  Gross per 24 hour  Intake   1005 ml  Output   1650 ml  Net   -645 ml   Filed Weights   02/13/16 0500 02/14/16 0500 02/16/16 0300  Weight: 110.1 kg (242 lb 11.6 oz) 103.9 kg (229 lb 0.9 oz) 107.6 kg (237 lb 3.4 oz)    Examination:  General exam: Lying in bed, drowsy Respiratory system: Mild bibasilar crackles Cardiovascular system:Irregularly irregular Gastrointestinal system: Abdomen is nondistended, soft and nontender. No  organomegaly or masses felt. Normal bowel sounds heard. Central nervous system: Alert and oriented. No focal neurological deficits. Extremities: 2 3+ + pitting edema bilaterally right greater than left, also component of nonpitting edema. Skin: No rashes, lesions or ulcers Psychiatry: Unable to assess given current mental state    Data Reviewed: I have personally  reviewed following labs and imaging studies  CBC:  Recent Labs Lab 02/10/2016 0755 02/15/2016 0915 02/13/16 0446 02/13/16 1030 02/15/16 0420 02/16/16 0213 02/16/16 0912  WBC 6.2 8.3 7.4 7.3 8.8 10.6*  --   NEUTROABS 4.2  --   --   --   --   --   --   HGB 5.2* 5.4* 7.0* 7.2* 7.0* 6.7* 7.2*  HCT 18.8* 19.9* 23.8* 24.4* 23.9* 23.0* 23.8*  MCV 74.0* 73.7* 76.0* 76.3* 77.1* 77.7*  --   PLT 209 258 214 187 199 222  --    Basic Metabolic Panel:  Recent Labs Lab 02/18/2016 0915 02/13/16 0446 02/14/16 0415 02/15/16 0420 02/16/16 0213  NA 136 137 136 134* 137  K 3.9 3.3* 3.6 3.2* 3.6  CL 90* 90* 89* 88* 89*  CO2 35* 37* 36* 36* 35*  GLUCOSE 115* 100* 101* 95 132*  BUN 82* 81* 80* 77* 77*  CREATININE 3.29* 3.18* 2.86* 2.51* 2.50*  CALCIUM 8.9 8.6* 8.4* 8.6* 8.7*  PHOS  --   --  4.9* 4.0 4.7*   GFR: Estimated Creatinine Clearance: 23.1 mL/min (by C-G formula based on Cr of 2.5). Liver Function Tests:  Recent Labs Lab 02/16/2016 0755 02/15/2016 0915 02/13/16 0446 02/14/16 0415 02/15/16 0420 02/16/16 0213  AST '24 27 28  '$ --   --   --   ALT 14 15 13*  --   --   --   ALKPHOS 108 108 99  --   --   --   BILITOT 1.2 1.2 1.5*  --   --   --   PROT 7.0 7.2 6.8  --   --   --   ALBUMIN 3.3* 3.5 3.2* 3.1* 3.4* 3.4*   No results for input(s): LIPASE, AMYLASE in the last 168 hours. No results for input(s): AMMONIA in the last 168 hours. Coagulation Profile:  Recent Labs Lab 02/16/2016 0755 02/26/2016 0915 02/13/16 0446  INR 7.12* 6.68* 6.12*   Cardiac Enzymes:  Recent Labs Lab 02/11/2016 0755  TROPONINI 0.03   BNP (last 3 results) No results for input(s): PROBNP in the last 8760 hours. HbA1C: No results for input(s): HGBA1C in the last 72 hours. CBG: No results for input(s): GLUCAP in the last 168 hours. Lipid Profile: No results for input(s): CHOL, HDL, LDLCALC, TRIG, CHOLHDL, LDLDIRECT in the last 72 hours. Thyroid Function Tests: No results for input(s): TSH, T4TOTAL,  FREET4, T3FREE, THYROIDAB in the last 72 hours. Anemia Panel:  Recent Labs  02/14/16 1014  FERRITIN 54   Urine analysis:    Component Value Date/Time   COLORURINE YELLOW 02/18/2016 [place] 02/11/2016 0938   LABSPEC <1.005* 02/17/2016 0938   PHURINE 7.0 02/07/2016 0938   GLUCOSEU NEGATIVE 02/25/2016 0938   HGBUR NEGATIVE 02/16/2016 0938   BILIRUBINUR NEGATIVE 02/15/2016 0938   KETONESUR NEGATIVE 02/17/2016 0938   PROTEINUR NEGATIVE 02/26/2016 0938   UROBILINOGEN 0.2 01/17/2015 1451   NITRITE NEGATIVE 02/24/2016 0938   LEUKOCYTESUR SMALL* 02/24/2016 0938   Sepsis Labs: '@LABRCNTIP'$ (procalcitonin:4,lacticidven:4)  ) Recent Results (from the past 240 hour(s))  MRSA PCR Screening     Status: Abnormal   Collection  Time: 02/02/2016 11:38 AM  Result Value Ref Range Status   MRSA by PCR POSITIVE (A) NEGATIVE Final    Comment: RESULT CALLED TO, READ BACK BY AND VERIFIED WITH: SCHOENEWITZ L. AT 1541 ON [id number] BY THOMPSON S.        The GeneXpert MRSA Assay (FDA approved for NASAL specimens only), is one component of a comprehensive MRSA colonization surveillance program. It is not intended to diagnose MRSA infection nor to guide or monitor treatment for MRSA infections.   Culture, blood (routine x 2)     Status: None (Preliminary result)   Collection Time: 02/16/16  2:13 AM  Result Value Ref Range Status   Specimen Description BLOOD RIGHT ANTECUBITAL  Final   Special Requests BOTTLES DRAWN AEROBIC AND ANAEROBIC 12CC EACH  Final   Culture NO GROWTH < 12 HOURS  Final   Report Status PENDING  Incomplete  Culture, blood (routine x 2)     Status: None (Preliminary result)   Collection Time: 02/16/16  2:30 AM  Result Value Ref Range Status   Specimen Description BLOOD RIGHT ARM  Final   Special Requests BOTTLES DRAWN AEROBIC ONLY 2CC  Final   Culture NO GROWTH < 12 HOURS  Final   Report Status PENDING  Incomplete         Radiology Studies: Dg Chest Port  1 View  02/16/2016  CLINICAL DATA:  Acute onset of shortness of breath and wheezing. Initial encounter. EXAM: PORTABLE CHEST 1 VIEW COMPARISON:  Chest radiograph from 02/04/2016 FINDINGS: Fluffy bilateral airspace opacification is noted, worsened from the prior study, with small bilateral pleural effusions. This is concerning for significant pneumonia. Pulmonary edema is considered less likely. ARDS cannot be excluded. No pneumothorax is seen. The cardiomediastinal silhouette is mildly enlarged. The patient is status post median sternotomy. A valve replacement is noted. Mild degenerative change is noted at the glenohumeral joints. No acute osseous abnormalities are seen. IMPRESSION: 1. Fluffy bilateral airspace opacification, worsened from prior study, with small bilateral pleural effusions. This is concerning for significant pneumonia. Pulmonary edema is considered less likely. ARDS cannot be excluded. 2. Mild cardiomegaly. 3. Mild degenerative change at the glenohumeral joints. Electronically Signed   By: Garald Balding M.D.   On: 02/16/2016 00:29        Scheduled Meds: . sodium chloride   Intravenous Once  . acetaZOLAMIDE  500 mg Oral BID  . albuterol  2.5 mg Inhalation TID  . antiseptic oral rinse  7 mL Mouth Rinse BID  . antiseptic oral rinse  7 mL Mouth Rinse QID  . aztreonam  1 g Intravenous Q8H  . budesonide  0.5 mg Inhalation BID  . chlorhexidine gluconate (SAGE KIT)  15 mL Mouth Rinse BID  . Chlorhexidine Gluconate Cloth  6 each Topical Q0600  . dabigatran  75 mg Oral BID  . diltiazem  180 mg Oral Daily  . furosemide  100 mg Intravenous BID  . mupirocin ointment  1 application Nasal BID  . potassium chloride  40 mEq Oral BID  . sodium chloride flush  3 mL Intravenous Q12H  . [START ON 02/18/2016] vancomycin  1,500 mg Intravenous Q48H   Continuous Infusions:    LOS: 4 days    Critical care time spent: 55 minutes. Greater than 50% of this time was spent in direct contact with  the patient coordinating care.     Lelon Frohlich, MD [facility] Pager [phone]  If 7PM-7AM, please contact night-coverage [email or web address] Password [facility] 02/16/2016, 12:59  PM

## 2016-02-17 ENCOUNTER — Inpatient Hospital Stay (HOSPITAL_COMMUNITY): Payer: Medicare Other

## 2016-02-17 DIAGNOSIS — I509 Heart failure, unspecified: Secondary | ICD-10-CM

## 2016-02-17 DIAGNOSIS — I5033 Acute on chronic diastolic (congestive) heart failure: Secondary | ICD-10-CM

## 2016-02-17 LAB — TYPE AND SCREEN
ABO/RH(D): A NEG
Antibody Screen: NEGATIVE
UNIT DIVISION: 0
Unit division: 0
Unit division: 0

## 2016-02-17 LAB — CBC WITH DIFFERENTIAL/PLATELET
Basophils Absolute: 0 10*3/uL (ref 0.0–0.1)
Basophils Relative: 1 %
EOS ABS: 0.8 10*3/uL — AB (ref 0.0–0.7)
EOS PCT: 10 %
HCT: 26.5 % — ABNORMAL LOW (ref 36.0–46.0)
Hemoglobin: 7.9 g/dL — ABNORMAL LOW (ref 12.0–15.0)
LYMPHS ABS: 0.9 10*3/uL (ref 0.7–4.0)
LYMPHS PCT: 11 %
MCH: 23.5 pg — AB (ref 26.0–34.0)
MCHC: 29.8 g/dL — AB (ref 30.0–36.0)
MCV: 78.9 fL (ref 78.0–100.0)
MONOS PCT: 10 %
Monocytes Absolute: 0.8 10*3/uL (ref 0.1–1.0)
Neutro Abs: 5.6 10*3/uL (ref 1.7–7.7)
Neutrophils Relative %: 70 %
PLATELETS: 133 10*3/uL — AB (ref 150–400)
RBC: 3.36 MIL/uL — AB (ref 3.87–5.11)
RDW: 19.4 % — ABNORMAL HIGH (ref 11.5–15.5)
WBC: 8 10*3/uL (ref 4.0–10.5)

## 2016-02-17 LAB — ECHOCARDIOGRAM COMPLETE
AO mean calculated velocity dopler: 191 cm/s
AOPV: 0.42 m/s
AV Area mean vel: 1.13 cm2
AV VEL mean LVOT/AV: 0.44
AV area mean vel ind: 0.51 cm2/m2
AV peak Index: 0.49
AV vel: 1.09
AVA: 1.09 cm2
AVAREAVTI: 1.08 cm2
AVAREAVTIIND: 0.49 cm2/m2
AVG: 17 mmHg
AVPG: 34 mmHg
AVPKVEL: 292 cm/s
CHL CUP AV VALUE AREA INDEX: 0.49
CHL CUP DOP CALC LVOT VTI: 24.2 cm
FS: 23 % — AB (ref 28–44)
HEIGHTINCHES: 62 in
IV/PV OW: 0.88
LA diam end sys: 51 mm
LA vol A4C: 103 ml
LA vol index: 49.6 mL/m2
LA vol: 110 mL
LADIAMINDEX: 2.3 cm/m2
LASIZE: 51 mm
LVOT SV: 61 mL
LVOT area: 2.54 cm2
LVOT peak VTI: 0.43 cm
LVOT peak grad rest: 6 mmHg
LVOT peak vel: 124 cm/s
LVOTD: 18 mm
P 1/2 time: 391 ms
PW: 10.6 mm — AB (ref 0.6–1.1)
VTI: 56.6 cm
WEIGHTICAEL: 3756.64 [oz_av]

## 2016-02-17 LAB — TROPONIN I
Troponin I: 0.03 ng/mL (ref <0.031)
Troponin I: 0.03 ng/mL (ref <0.031)
Troponin I: 0.04 ng/mL — ABNORMAL HIGH (ref <0.031)

## 2016-02-17 LAB — GLUCOSE, CAPILLARY
GLUCOSE-CAPILLARY: 94 mg/dL (ref 65–99)
Glucose-Capillary: 79 mg/dL (ref 65–99)
Glucose-Capillary: 80 mg/dL (ref 65–99)
Glucose-Capillary: 95 mg/dL (ref 65–99)

## 2016-02-17 LAB — COMPREHENSIVE METABOLIC PANEL
ALBUMIN: 2.6 g/dL — AB (ref 3.5–5.0)
ALK PHOS: 78 U/L (ref 38–126)
ALT: 14 U/L (ref 14–54)
ANION GAP: 8 (ref 5–15)
AST: 26 U/L (ref 15–41)
BILIRUBIN TOTAL: 2.3 mg/dL — AB (ref 0.3–1.2)
BUN: 72 mg/dL — ABNORMAL HIGH (ref 6–20)
CALCIUM: 8.7 mg/dL — AB (ref 8.9–10.3)
CO2: 37 mmol/L — AB (ref 22–32)
CREATININE: 2.13 mg/dL — AB (ref 0.44–1.00)
Chloride: 93 mmol/L — ABNORMAL LOW (ref 101–111)
GFR calc non Af Amer: 22 mL/min — ABNORMAL LOW (ref >60)
GFR, EST AFRICAN AMERICAN: 25 mL/min — AB (ref >60)
GLUCOSE: 88 mg/dL (ref 65–99)
Potassium: 3 mmol/L — ABNORMAL LOW (ref 3.5–5.1)
SODIUM: 138 mmol/L (ref 135–145)
TOTAL PROTEIN: 5.8 g/dL — AB (ref 6.5–8.1)

## 2016-02-17 LAB — MAGNESIUM: Magnesium: 2.4 mg/dL (ref 1.7–2.4)

## 2016-02-17 LAB — BRAIN NATRIURETIC PEPTIDE: B Natriuretic Peptide: 711.4 pg/mL — ABNORMAL HIGH (ref 0.0–100.0)

## 2016-02-17 LAB — HEPARIN LEVEL (UNFRACTIONATED): Heparin Unfractionated: 0.18 IU/mL — ABNORMAL LOW (ref 0.30–0.70)

## 2016-02-17 LAB — POTASSIUM: Potassium: 3.3 mmol/L — ABNORMAL LOW (ref 3.5–5.1)

## 2016-02-17 LAB — PHOSPHORUS: PHOSPHORUS: 3.8 mg/dL (ref 2.5–4.6)

## 2016-02-17 LAB — APTT: aPTT: 82 seconds — ABNORMAL HIGH (ref 24–37)

## 2016-02-17 LAB — PROTIME-INR
INR: 3.21 — ABNORMAL HIGH (ref 0.00–1.49)
PROTHROMBIN TIME: 32.2 s — AB (ref 11.6–15.2)

## 2016-02-17 LAB — HIV ANTIBODY (ROUTINE TESTING W REFLEX): HIV Screen 4th Generation wRfx: NONREACTIVE

## 2016-02-17 LAB — FIBRINOGEN: FIBRINOGEN: 358 mg/dL (ref 204–475)

## 2016-02-17 MED ORDER — POTASSIUM CHLORIDE 10 MEQ/50ML IV SOLN
10.0000 meq | INTRAVENOUS | Status: AC
  Administered 2016-02-17 (×6): 10 meq via INTRAVENOUS
  Filled 2016-02-17 (×6): qty 50

## 2016-02-17 MED ORDER — FUROSEMIDE 10 MG/ML IJ SOLN: 80.0000 mg | Freq: Every day | INTRAMUSCULAR | Status: DC

## 2016-02-17 MED ORDER — FUROSEMIDE 10 MG/ML IJ SOLN
80.0000 mg | Freq: Every day | INTRAMUSCULAR | Status: DC
  Administered 2016-02-17 – 2016-02-18 (×2): 80 mg via INTRAVENOUS
  Filled 2016-02-17 (×2): qty 8

## 2016-02-17 MED ORDER — HEPARIN (PORCINE) IN NACL 100-0.45 UNIT/ML-% IJ SOLN
1400.0000 [IU]/h | INTRAMUSCULAR | Status: DC
  Administered 2016-02-17: 1050 [IU]/h via INTRAVENOUS
  Administered 2016-02-18: 1400 [IU]/h via INTRAVENOUS
  Filled 2016-02-17 (×4): qty 250

## 2016-02-17 MED ORDER — FAMOTIDINE IN NACL 20-0.9 MG/50ML-% IV SOLN
20.0000 mg | INTRAVENOUS | Status: DC
  Filled 2016-02-17: qty 50

## 2016-02-17 MED ORDER — VITAL HIGH PROTEIN PO LIQD
1000.0000 mL | ORAL | Status: DC
  Administered 2016-02-17: 1000 mL
  Administered 2016-02-17 – 2016-02-18 (×2)
  Administered 2016-02-18: 1000 mL
  Administered 2016-02-18
  Administered 2016-02-19 – 2016-02-25 (×8): 1000 mL
  Administered 2016-02-26: 23:00:00
  Administered 2016-02-26: 1000 mL
  Administered 2016-02-27
  Filled 2016-02-17: qty 1000

## 2016-02-17 MED ORDER — PRO-STAT SUGAR FREE PO LIQD
30.0000 mL | Freq: Two times a day (BID) | ORAL | Status: DC
  Administered 2016-02-17 – 2016-02-27 (×21): 30 mL
  Filled 2016-02-17 (×22): qty 30

## 2016-02-17 MED ORDER — SODIUM CHLORIDE 0.9% FLUSH
10.0000 mL | INTRAVENOUS | Status: DC | PRN
Start: 2016-02-17 — End: 2016-02-27
  Administered 2016-02-17: 20 mL
  Filled 2016-02-17: qty 40

## 2016-02-17 MED ORDER — POTASSIUM CHLORIDE 10 MEQ/50ML IV SOLN
INTRAVENOUS | Status: AC
  Filled 2016-02-17: qty 50

## 2016-02-17 NOTE — Progress Notes (Signed)
  Echocardiogram 2D Echocardiogram has been performed.  Delcie RochENNINGTON, Kathlen Sakurai 02/17/2016, 12:18 PM

## 2016-02-17 NOTE — Progress Notes (Signed)
eLink Physician-Brief Progress Note Patient Name: Cheyenne AdasRita Schmieg DOB: April 02, 1941 MRN: [id number]   Date of Service  02/17/2016  HPI/Events of Note  Hypokalemia  eICU Interventions  Potassium replaced     Intervention Category Intermediate Interventions: Electrolyte abnormality - evaluation and management  Kinney Sackmann 02/17/2016, 2:56 AM

## 2016-02-17 NOTE — Progress Notes (Signed)
Hypoglycemic Event  CBG: 66  Treatment: D50 IV 25 mL  Symptoms: None  Follow-up CBG: Time:0005 CBG Result:94  Possible Reasons for Event: Inadequate meal intake  Comments/MD notified:Yes    Janee Mornhompson, Jacy Brocker E

## 2016-02-17 NOTE — Progress Notes (Signed)
ANTICOAGULATION CONSULT NOTE  Pharmacy Consult for heparin Indication: atrial fibrillation  Allergies  Allergen Reactions  . Bee Venom Swelling  . Doxycycline     Hoarseness / throat irritation  . Latex Rash  . Penicillins Rash    Patient Measurements: Height: 5\' 2"  (157.5 cm) Weight: 234 lb 12.6 oz (106.5 kg) IBW/kg (Calculated) : 50.1 Heparin Dosing Weight: 75.8 kg  Vital Signs: Temp: 98.6 F (37 C) (06/16 1600) Temp Source: Oral (06/16 1600) BP: 89/47 mmHg (06/16 1500) Pulse Rate: 75 (06/16 1500)  Labs:  Recent Labs  02/15/16 0420 02/16/16 0213 02/16/16 0912 02/17/16 0120 02/17/16 0625 02/17/16 1300 02/17/16 1656  HGB 7.0* 6.7* 7.2* 7.9*  --   --   --   HCT 23.9* 23.0* 23.8* 26.5*  --   --   --   PLT 199 222  --  133*  --   --   --   APTT  --   --   --  82*  --   --   --   LABPROT  --   --   --  32.2*  --   --   --   INR  --   --   --  3.21*  --   --   --   HEPARINUNFRC  --   --   --   --   --   --  0.18*  CREATININE 2.51* 2.50*  --  2.13*  --   --   --   TROPONINI  --   --   --  0.03 0.03 0.04*  --     Estimated Creatinine Clearance: 27 mL/min (by C-G formula based on Cr of 2.13).     Assessment: 75 yo woman [facility] with hx AFib on Pradaxa PTA. Pharmacy consulted to dose heparin.  PMH arthritis, AFib, bioprosthetic AVR, COPD, HFpEF (EF 55-60%)  Last dose dabigatran on 6/14 @ 2110. Hgb 7.9, plt 133. No noted bleeding.   Initial heparin level = 0.18  Goal of Therapy:  Heparin level 0.3-0.7 units/ml aPTT 66-102 seconds Monitor platelets by anticoagulation protocol: Yes   Plan:  Heparin to 1200 units / hr Daily HL/CBC Monitor s/sx bleeding  Thank you Okey RegalLisa Pearly Bartosik, PharmD [phone] 02/17/2016,5:30 PM

## 2016-02-17 NOTE — Care Management Note (Signed)
Case Management Note  Patient Details  Name: Sandra Turner MRN: [id number] Date of Birth: [birth year]  Subjective/Objective:    [facility] on ventialtor, sedation, tube feeds.                Pt admitted with AKI. Pt lives alone and says she has a sister and neighbors who provide support. Pt is ind with ADL'Turner and has a walker she uses as needed for ambulation. Pt has home O2 and neb, she uses 3.5L at baseline. [facility], anticipate pt will need HH services, possible SNF.   Action/Plan: Will need PT eval when appropriate, will cont to follow for DC planning.   Expected Discharge Date:     02/18/2016             Expected Discharge Plan:  Home w Home Health Services  In-House Referral:  NA  Discharge planning Services  CM Consult  Post Acute Care Choice:  Home Health Choice offered to:     DME Arranged:    DME Agency:     HH Arranged:    HH Agency:     Status of Service:  In process, will continue to follow  Medicare Important Message Given:  Yes Date Medicare IM Given:    Medicare IM give by:    Date Additional Medicare IM Given:    Additional Medicare Important Message give by:     If discussed at Long Length of Stay Meetings, dates discussed:    Additional Comments:  Sandra Turner, Sandra Welby S, RN 02/17/2016, 8:45 AM

## 2016-02-17 NOTE — Progress Notes (Addendum)
ANTICOAGULATION CONSULT NOTE - Initial Consult  Pharmacy Consult for heparin Indication: atrial fibrillation  Allergies  Allergen Reactions  . Bee Venom Swelling  . Doxycycline     Hoarseness / throat irritation  . Latex Rash  . Penicillins Rash    Patient Measurements: Height: _0  (157.5 cm) Weight: 234 lb 12.6 oz (106.5 kg) IBW/kg (Calculated) : 50.1 Heparin Dosing Weight: 75.8 kg  Vital Signs: Temp: 100 F (37.8 C) (06/16 1117) Temp Source: Axillary (06/16 1117) BP: 117/71 mmHg (06/16 1300) Pulse Rate: 82 (06/16 1300)  Labs:  Recent Labs  02/15/16 0420 02/16/16 0213 02/16/16 0912 02/17/16 0120 02/17/16 0625 02/17/16 1300  HGB 7.0* 6.7* 7.2* 7.9*  --   --   HCT 23.9* 23.0* 23.8* 26.5*  --   --   PLT 199 222  --  133*  --   --   APTT  --   --   --  82*  --   --   LABPROT  --   --   --  32.2*  --   --   INR  --   --   --  3.21*  --   --   CREATININE 2.51* 2.50*  --  2.13*  --   --   TROPONINI  --   --   --  0.03 0.03 0.04*    Estimated Creatinine Clearance: 27 mL/min (by C-G formula based on Cr of 2.13).   Medical History: Past Medical History  Diagnosis Date  . Arthritis   . Chronic atrial fibrillation (HCC)     a. previously on Pradaxa - on Coumadin since valve surgery.  . Aortic stenosis     a. Severe - s/p pericardial AVR with MAZE 02/04/15.  Marland Kitchen Pneumonia 09/2014  . History of bronchitis 2015  . Family history of adverse reaction to anesthesia     Uncle with MH in the 60's,cousin in the 53's with MH  . 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 COPD (chronic obstructive pulmonary disease) ([place])   . Normal coronary  arteries     a. By cath 11/2014.  . Pulmonary hypertension ([place])     a. Mod by cath 11/2014.  Marland Kitchen Chronic diastolic CHF (congestive heart failure) ([place])   . Vocal cord paralysis     a. Dx post-AVR in 01/2015. Required PANDA, intubation during that admission.  . Pleural effusion   . AKI (acute kidney injury) ([place])     a. H/o AKI after AVR in 01/2015.  . Tracheomalacia   . Anemia     a. 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    Medications:  Scheduled:  . antiseptic oral rinse  7 mL Mouth Rinse QID  . aztreonam  1 g Intravenous Q8H  . chlorhexidine gluconate (SAGE KIT)  15 mL Mouth Rinse BID  . Chlorhexidine Gluconate Cloth  6 each Topical Q0600  . dextrose  25 g Intravenous Once  . diltiazem  30 mg Per Tube Q6H  . [START ON 02/18/2016] famotidine (PEPCID) IV  20 mg Intravenous Q24H  . feeding supplement (PRO-STAT SUGAR FREE 64)  30 mL Per Tube BID  . feeding supplement (VITAL HIGH PROTEIN)  1,000 mL Per Tube Q24H  . fentaNYL (SUBLIMAZE) injection  50 mcg Intravenous Once  . [START ON 02/18/2016] furosemide  80 mg Intravenous Daily  . ipratropium  0.5 mg Nebulization Q6H  . levalbuterol  0.63 mg Nebulization Q6H  . sodium chloride flush  3 mL Intravenous Q12H  . [START ON 02/18/2016] vancomycin  1,500 mg Intravenous Q48H    Assessment: 75 yo woman admitted 03/01/2016 from [facility] for dyspnea with hx AFib on Pradaxa PTA. Pharmacy consulted to dose heparin.  PMH arthritis, AFib, bioprosthetic AVR, COPD, HFpEF (EF 55-60%)  Last dose dabigatran on 6/14 @ 2110. Hgb 7.9, plt 133. No noted bleeding.   Goal of Therapy:  Heparin level 0.3-0.7 units/ml aPTT 66-102 seconds Monitor platelets by anticoagulation protocol: Yes   Plan:  Heparin 1050 units/h 1830 HL Daily HL/CBC Monitor s/sx bleeding   Heloise Ochoa, Pharm.D., BCPS PGY2 Cardiology Pharmacy Resident Pager: [phone]  02/17/2016,3:28 PM

## 2016-02-17 NOTE — Progress Notes (Signed)
PULMONARY / CRITICAL CARE MEDICINE   Name: Sandra Turner MRN: [id number] DOB: 1940-09-26    ADMISSION DATE:  03-11-2016 CONSULTATION DATE:  02/16/2016  REFERRING MD:  Christy Gentles, M.D. / [facility]  CHIEF COMPLAINT:  Shortness of breath  HISTORY OF PRESENT ILLNESS:   This is a 75 year old female well known to the [place] pulmonary and critical care service who comes to [facility] on 11-Mar-2016 in the setting of dyspnea. She was admitted that day and was noted to have acute kidney injury and a low hemoglobin value. She was admitted by the hospitalist service and nephrology was consulted. She was administered packed red blood cells and Lasix was given as well. Despite these measures she had ongoing respiratory distress with hypoxemia. She ultimately required intubation on June 15. Pulmonary and critical care medicine in [place] was asked to take the patient on transfer because of no pulmonary and critical care support at [facility].  REVIEW OF SYSTEMS:  Unable to obtain secondary to intubation.  SUBJECTIVE:  Pt intubated and sedated with versed and fentanyl does not follow commands.  VITAL SIGNS: BP 105/30 mmHg  Pulse 84  Temp(Src) 98.4 F (36.9 C) (Oral)  Resp 25  Ht  (1.575 m)  Wt 234 lb 12.6 oz (106.5 kg)  BMI 42.93 kg/m2  SpO2 98%  HEMODYNAMICS:    VENTILATOR SETTINGS: Vent Mode:  [-] PRVC FiO2 (%):  [40 %-60 %] 40 % Set Rate:  [15 bmp-20 bmp] 20 bmp Vt Set:  [400 mL-440 mL] 440 mL PEEP:  [5 cmH20-8 cmH20] 8 cmH20 Plateau Pressure:  [22 cmH20-28 cmH20] 28 cmH20  INTAKE / OUTPUT: I/O last 3 completed shifts: In: 3364.4 [I.V.:1029.4; Blood:1005; Other:20; NG/GT:200; IV Piggyback:1110] Out: 3475 [Urine:3475]  PHYSICAL EXAMINATION: General:  Sedated. No acute distress  Integument:  Warm & dry. No rash on exposed skin. Bruising and weeping bilateral upper extremities. Scaling dermis in lower extremities HEENT: Endotracheal tube in place, supple, no  JVD Cardiovascular:  irregular rhythm regular rate, Anasarca Pulmonary:  Coarse breath sounds on auscultation bilaterally. Symmetric chest wall rise on ventilator Abdomen: Soft. Normal bowel sounds x 4. Protuberant  Musculoskeletal:  Normal bulk. No joint deformity or effusion appreciated Neurological:  Sedated. Withdraws to pain in extremities. PERRL   LABS:  BMET  Recent Labs Lab 02/15/16 0420 02/16/16 0213 02/17/16 0120  NA 134* 137 138  K 3.2* 3.6 3.0*  CL 88* 89* 93*  CO2 36* 35* 37*  BUN 77* 77* 72*  CREATININE 2.51* 2.50* 2.13*  GLUCOSE 95 132* 88    Electrolytes  Recent Labs Lab 02/15/16 0420 02/16/16 0213 02/17/16 0120  CALCIUM 8.6* 8.7* 8.7*  MG  --   --  2.4  PHOS 4.0 4.7* 3.8    CBC  Recent Labs Lab 02/15/16 0420 02/16/16 0213 02/16/16 0912 02/17/16 0120  WBC 8.8 10.6*  --  8.0  HGB 7.0* 6.7* 7.2* 7.9*  HCT 23.9* 23.0* 23.8* 26.5*  PLT 199 222  --  133*    Coag's  Recent Labs Lab 03/11/2016 0915 02/13/16 0446 02/17/16 0120  APTT  --   --  82*  INR 6.68* 6.12* 3.21*    Sepsis Markers  Recent Labs Lab 02/16/16 0213  PROCALCITON 0.15    ABG  Recent Labs Lab 02/16/16 1235 02/16/16 1500 02/16/16 2008  PHART 7.475* 7.529* 7.554*  PCO2ART 55.8* 48.3* 46.4*  PO2ART 58.6* 72.3* 50.2*    Liver Enzymes  Recent Labs Lab March 11, 2016 0915 02/13/16 0446  02/15/16 0420 02/16/16 0213 02/17/16 0120  AST 27 28  --   --   --  26  ALT 15 13*  --   --   --  14  ALKPHOS 108 99  --   --   --  78  BILITOT 1.2 1.5*  --   --   --  2.3*  ALBUMIN 3.5 3.2*  < > 3.4* 3.4* 2.6*  < > = values in this interval not displayed.  Cardiac Enzymes  Recent Labs Lab 21-Feb-2016 0755 02/17/16 0120 02/17/16 0625  TROPONINI 0.03 0.03 0.03    Glucose  Recent Labs Lab 02/16/16 2317 02/17/16 0009  GLUCAP 66 94    Imaging Ct Abdomen Pelvis Wo Contrast  02/17/2016  CLINICAL DATA:  Acute onset of anemia.  Initial encounter. EXAM: CT ABDOMEN AND  PELVIS WITHOUT CONTRAST TECHNIQUE: Multidetector CT imaging of the abdomen and pelvis was performed following the standard protocol without IV contrast. COMPARISON:  Renal ultrasound performed 03/13/2015 FINDINGS: Moderate right and small left pleural effusions are noted, with patchy bibasilar airspace opacities, which may reflect pneumonia or pulmonary edema. The heart is mildly enlarged. Diffuse coronary artery calcifications are seen. An aortic valve replacement is noted. The patient is status post median sternotomy. A small amount of free fluid is seen tracking within the abdomen and pelvis, some of which is high in attenuation. This is of uncertain significance. The patient's enteric tube is seen ending at the antrum of the stomach. The liver and spleen are unremarkable in appearance. The gallbladder is within normal limits. Vague soft tissue inflammation about the pancreas is nonspecific. There is incomplete rotation of both kidneys. The kidneys are otherwise grossly unremarkable. Nonspecific perinephric stranding is noted bilaterally. There is no evidence of hydronephrosis. No renal or ureteral stones are identified. No free fluid is identified. The small bowel is unremarkable in appearance. The stomach is within normal limits. No acute vascular abnormalities are seen. Prominence of the inferior vena cava may reflect volume repletion. The appendix is not definitely characterized; there is no evidence for appendicitis. Scattered diverticulosis is noted along the sigmoid colon, without evidence of diverticulitis. The bladder is decompressed, with a Foley catheter in place. It is difficult to assess due to surrounding ascites. No inguinal lymphadenopathy is seen. Diffuse soft tissue edema is noted tracking about the abdomen and pelvis, concerning for anasarca. No acute osseous abnormalities are identified. There is chronic compression deformity of vertebral body T12. There is mild grade 1 anterolisthesis of L4  on L5, reflecting underlying facet disease. Vacuum phenomenon is noted at L2-L3. IMPRESSION: 1. Moderate right and small left pleural effusions, with patchy bibasilar airspace opacities, which may reflect pneumonia or pulmonary edema. 2. Small amount of ascites within the abdomen and pelvis, some of which is high in attenuation, of uncertain significance. 3. Vague nonspecific soft tissue inflammation about the pancreas may simply reflect underlying edema. Would correlate with the patient's symptoms. 4. Diffuse soft tissue edema tracking about the abdomen and pelvis, concerning for anasarca. 5. Scattered diverticulosis along the sigmoid colon without evidence of diverticulitis. 6. Mild cardiomegaly.  Diffuse coronary artery calcifications seen. 7. Chronic compression deformity of vertebral body T12. Electronically Signed   By: Roanna Raider M.D.   On: 02/17/2016 04:08   Portable Chest Xray  02/17/2016  CLINICAL DATA:  Acute onset of respiratory failure. Hypoxemia. Initial encounter. EXAM: PORTABLE CHEST 1 VIEW COMPARISON:  Chest radiograph performed earlier today at 5:10 p.m. FINDINGS: The patient's endotracheal tube is  seen ending 2 cm above the carina. An enteric tube is noted extending below the diaphragm. A right PICC is noted ending about the distal SVC. Small bilateral pleural effusions are noted. Diffuse bilateral airspace opacification may reflect pulmonary edema or pneumonia. ARDS cannot be excluded. The cardiomediastinal silhouette is borderline enlarged. The patient is status post median sternotomy. An aortic valve replacement noted. No acute osseous abnormalities are seen. Degenerative change is noted at the glenohumeral joints bilaterally. An apparent loose body is seen on the right. IMPRESSION: 1. Endotracheal tube seen ending 2 cm above the carina. 2. Small bilateral pleural effusions noted. Diffuse bilateral airspace opacification may reflect pulmonary edema or pneumonia. ARDS cannot be excluded.  3. Borderline cardiomegaly. 4. Degenerative change at the glenohumeral joints bilaterally, with apparent loose body on the right. Electronically Signed   By: Roanna Raider M.D.   On: 02/17/2016 00:09   Dg Chest Port 1 View  02/16/2016  CLINICAL DATA:  Intubated EXAM: PORTABLE CHEST 1 VIEW COMPARISON:  02/16/2016 FINDINGS: Cardiomediastinal silhouette is stable. There is NG tube in place with tip 2.2 cm above the carina. NG tube in place. Cardiomegaly again noted. Again noted bilateral patchy airspace is right greater than left highly suspicious for bilateral pneumonia or ARDS. Bilateral small pleural effusion with bilateral basilar atelectasis or infiltrate. Again noted status post median sternotomy and cardiac valve replacement. No pneumothorax. IMPRESSION: Endotracheal and NG tube in place. No pneumothorax. Again noted bilateral patchy airspace disease right greater than left highly suspicious for bilateral pneumonia or ARDS. Bilateral small pleural effusion with bilateral basilar atelectasis or infiltrate. Electronically Signed   By: Natasha Mead M.D.   On: 02/16/2016 14:17   Dg Chest Port 1v Same Day  02/16/2016  CLINICAL DATA:  Status post PICC line EXAM: PORTABLE CHEST 1 VIEW COMPARISON:  02/16/2016 FINDINGS: New PICC line is noted on the right with the tip at the cavoatrial junction. Postsurgical changes are again seen. An endotracheal tube and nasogastric catheter are noted in satisfactory position. Diffuse bilateral patchy airspace disease is again identified. Small bilateral pleural effusions are noted. IMPRESSION: PICC line in satisfactory position. The remainder of the exam is stable. Electronically Signed   By: Alcide Clever M.D.   On: 02/16/2016 17:28     STUDIES:  Echo (03/16/15): LV EF: 55%-60% TTE (03/16/15): LV EF 55-60, mildly calcified mitral valve, bioprosthetic aortic valve functioning well but poorly visualized, right ventricle moderately dilated, RVSP estimate 44 mmHg, right atrium  severely dilated, mild to moderate tricuspid regurgitation PFT (04/15/15): FVC 0.96 L (36%) FEV1 0.74 L (36%) FEV1/FVC 0.76 no bronchodilator response TLC 2.90 L (61%) DLCO uncorrected 40% CXR PA/LAT 6/11: Silhouetting of bilateral hemidiaphragms with lower lobe opacification suggestive of pleural effusions. PORT CXR 6/15: Patchy bilateral alveolar opacities now involving the upper lung zones. Improved visualization of hemidiaphragms bilaterally. Endotracheal tube & orogastric tube in good position. Right PICC line with tip in SVC. Blunting of right costocardiac angle suggestive of pleural effusion. CT Abdomen Pelvis 6/16: Moderate right and small left pleural effusions, with patchy bibasilar airspace opacities, which may reflect pneumonia or pulmonary edema. Small amount of ascites within the abdomen and pelvis, some of which is high in attenuation, of uncertain significance. Vague nonspecific soft tissue inflammation about the pancreas may simply reflect underlying edema. Would correlate with the patient's symptoms. Diffuse soft tissue edema tracking about the abdomen and pelvis, concerning for anasarca. Scattered diverticulosis along the sigmoid colon without evidence of diverticulitis. Mild cardiomegaly. Diffuse coronary  artery calcifications seen.  MICROBIOLOGY: MRSA PCR 6/11:  Positive Blood Ctx x2 6/15 >> Tracheal Asp Ctx 6/15 >> Urine Strep Ag 6/15:  Negative   ANTIBIOTICS: Aztreonam 6/15 >> Vancomycin 6/15 >>  SIGNIFICANT EVENTS: 6/11 - Admission to [facility] 6/11 - Transfused 2u PRBC 6/15 - Transfused 3u PRBC 6/15 - Intubation for respiratory failure and [facility]  LINES/TUBES: OETT 7.5 6/15 >> OGT 6/15 >> FOLEY 6/11>> R TL PICC 6/15 >> PIV x2  DISCUSSION: This is a 75 year old female with a past medical history significant for aortic valve replacement in 2016 complicated by recurrent episodes of respiratory failure with hypoxemia due to mucus plugging and  underlying severe asthma who returned to the hospital on 02/16/2016 in the setting of acute kidney injury, anemia, and acute respiratory failure with hypoxemia. She has a recent history of recurrent episodes of diastolic heart failure requiring multiple hospitalizations. The differential diagnosis of her acute respiratory failure with hypoxemia includes cardiogenic pulmonary edema given her recent history, however concern for healthcare associated pneumonia or lung injury related to blood transfusion is reasonable as her chest x-ray is suggestive of ARDS and her condition worsen despite diuresis.  ASSESSMENT / PLAN:  PULMONARY A: Acute Respiratory Failure with Hypoxia - Volume overload vs TRALI vs HCAP. Possible ARDS, clearly ARDS physiology Possible HCAP H/O Asthma H/O Tracheomalacia  P:   Full Vent Support w/ PEEP for recruitment, ARDS protocol Daily SBT & WUA ABG & Port CXR  Xopenex & Atrovent Nebs q6hr There may be some benefit to thora, although suspect that effusions are transudative Consider FOB for infectious causes depending on progress, empiric abx as below/   CARDIOVASCULAR A:  Chronic Diastolic CHF H/O Atrial fibrillation - S/P MAZE 2016 H/O Aortic Stenosis - S/P bioprosthetic aortic valve.  P:  Telemetry monitoring  Vitals per unit protocol Holding Pradaxa for anemia, will restart when able given AVR; Heparin gtt for now per pharmacy protocol Continue Diltizem 30 mg VT q6hr Lasix 80 mg IV qd Diamox 500mg  VT x1 6/16  RENAL A:   Acute on Chronic Renal Failure - Improving with diuresis. Metabolic Akalosis  P:   Trending UOP with Foley Monitoring electrolytes & renal function daily Replace electrolytes as needed Replace Potassium will recheck post replacement  GASTROINTESTINAL A:   No acute issues.  P:   NPO Pepcid IV q12hr Dietary Consult to initiate tube feedings  HEMATOLOGIC A:   Chronic Anticoagulation - Pradaxa for A fib. Anemia - No obvious  bleeding source. S/P 2u PRBC 6/11 & 3u PRBC 6/15.  P:  Trending cell counts daily w/ CBC CBC post last transfusion SCDs Transfuse for Hgb <7.0 F/U Hemoccult Stool  INFECTIOUS A:   HCAP  P:   Empiric Vancomycin & Aztreonam Day #2 6/16 Awaiting blood & tracheal aspirate cultures May require bronchoscopy for sampling Trending Procalcitonin per algorithm Checking Urine Legionella Antigen  ENDOCRINE A:   Hyperglycemia - Mild.    P:   Monitor glucose on daily labs.  NEUROLOGIC A:   Sedation on Ventilator.  P:   RASS goal:  0 to -1 Fentanyl gtt & IV prn to maintain RASS goal Versed gtt & IV prn to maintain RASS goal  FAMILY  - Updates: No family at bedside to update  - Inter-disciplinary family meet or Palliative Care meeting due by:  6/22  Sonda Rumbleana Blakeney, AGNP  Pulmonary/Critical Care   Attending Note:  I have examined patient, reviewed labs, studies and notes. I have discussed  the case with Murvin Natal, and I agree with the data and plans as amended above. 75 yo woman, hx AVR and chronic CHF, asthma/COPD with hx resp failure and mucous plugging. Admitted to [facility] with dyspnea, B infiltrates, anemia. Received 5u PRBC since 6/11. Has worsening B infiltrates and B effusions. Suspect this is volume overload - consider also TRALI or HCAP. On my eval she has anasarca, is sedated and ventilated. B crackles on exam. Weeping from skin, widespread bruising. We changed her anticoag to heparin gtt, will work to wean FiO2 and PEEP. She might benefit from thoracentesis but I suspect these effusions are transudates. Diuretics ordered. Consider HCAP - on empiric abx. May require FOB for cx data if infiltrates are not clearing w further diuresis.  Independent critical care time is 36 minutes.   Levy Pupa, MD, PhD 02/17/2016, 1:14 PM [facility] [phone] or if no answer [phone]

## 2016-02-17 NOTE — Progress Notes (Signed)
Initial Nutrition Assessment  DOCUMENTATION CODES:   Morbid obesity  INTERVENTION:    Initiate TF via OGT with Vital High Protein at goal rate of 45 ml/h (1080 ml per day) and Prostat 30 ml BID to provide 1280 kcals, 125 gm protein, 903 ml free water daily.  NUTRITION DIAGNOSIS:   Inadequate oral intake related to inability to eat as evidenced by NPO status.  GOAL:   Provide needs based on ASPEN/SCCM guidelines  MONITOR:   Vent status, Labs, Weight trends, TF tolerance, Skin, I & O's  REASON FOR ASSESSMENT:   Consult Enteral/tube feeding initiation and management  ASSESSMENT:   75 year old female who was [facility] on 06[facility]/16 in the setting of dyspnea. She ultimately required intubation on June 15 and was [facility].   [facility] patient was on a renal diet, consuming 25-50% of meals. [facility] with TRALI vs HCAP. Labs reviewed: potassium low, receiving IV repletion. Received MD Consult for TF initiation and management. OGT in place. Patient is currently intubated on ventilator support MV: 9.5 L/min Temp (24hrs), Avg:97.9 F (36.6 C), Min:97 F (36.1 C), Max:98.4 F (36.9 C)    Diet Order:  Diet NPO time specified  Skin:  Reviewed, no issues  Last BM:  6/14  Height:   Ht Readings from Last 1 Encounters:  02/16/16 5\' 2"  (1.575 m)    Weight:   Wt Readings from Last 1 Encounters:  02/17/16 234 lb 12.6 oz (106.5 kg)    Ideal Body Weight:  50 kg  BMI:  Body mass index is 42.93 kg/(m^2).  BMI=39.4 using usual weight of ~215 lbs (per review of chart)  Estimated Nutritional Needs:   Kcal:  [id number]  Protein:  125 gm  Fluid:  >/= 1.5 L  EDUCATION NEEDS:   No education needs identified at this time  Joaquin CourtsKimberly Lusero Nordlund, RD, LDN, CNSC Pager [phone] After Hours Pager [phone]

## 2016-02-18 ENCOUNTER — Inpatient Hospital Stay (HOSPITAL_COMMUNITY): Payer: Medicare Other

## 2016-02-18 DIAGNOSIS — J810 Acute pulmonary edema: Secondary | ICD-10-CM

## 2016-02-18 LAB — BASIC METABOLIC PANEL
ANION GAP: 9 (ref 5–15)
Anion gap: 8 (ref 5–15)
BUN: 77 mg/dL — AB (ref 6–20)
BUN: 83 mg/dL — ABNORMAL HIGH (ref 6–20)
CALCIUM: 7.7 mg/dL — AB (ref 8.9–10.3)
CHLORIDE: 90 mmol/L — AB (ref 101–111)
CO2: 36 mmol/L — ABNORMAL HIGH (ref 22–32)
CO2: 34 mmol/L — AB (ref 22–32)
CREATININE: 2.21 mg/dL — AB (ref 0.44–1.00)
Calcium: 8.1 mg/dL — ABNORMAL LOW (ref 8.9–10.3)
Chloride: 94 mmol/L — ABNORMAL LOW (ref 101–111)
Creatinine, Ser: 2.39 mg/dL — ABNORMAL HIGH (ref 0.44–1.00)
GFR calc Af Amer: 24 mL/min — ABNORMAL LOW (ref >60)
GFR calc Af Amer: 22 mL/min — ABNORMAL LOW (ref >60)
GFR calc non Af Amer: 19 mL/min — ABNORMAL LOW (ref >60)
GFR, EST NON AFRICAN AMERICAN: 21 mL/min — AB (ref >60)
GLUCOSE: 296 mg/dL — AB (ref 65–99)
Glucose, Bld: 108 mg/dL — ABNORMAL HIGH (ref 65–99)
Potassium: 3.4 mmol/L — ABNORMAL LOW (ref 3.5–5.1)
Potassium: 3.7 mmol/L (ref 3.5–5.1)
SODIUM: 138 mmol/L (ref 135–145)
Sodium: 133 mmol/L — ABNORMAL LOW (ref 135–145)

## 2016-02-18 LAB — GLUCOSE, CAPILLARY
GLUCOSE-CAPILLARY: 106 mg/dL — AB (ref 65–99)
GLUCOSE-CAPILLARY: 110 mg/dL — AB (ref 65–99)
GLUCOSE-CAPILLARY: 119 mg/dL — AB (ref 65–99)
Glucose-Capillary: 126 mg/dL — ABNORMAL HIGH (ref 65–99)
Glucose-Capillary: 145 mg/dL — ABNORMAL HIGH (ref 65–99)
Glucose-Capillary: 142 mg/dL — ABNORMAL HIGH (ref 65–99)

## 2016-02-18 LAB — CBC
HCT: 24.3 % — ABNORMAL LOW (ref 36.0–46.0)
Hemoglobin: 7 g/dL — ABNORMAL LOW (ref 12.0–15.0)
MCH: 23.6 pg — AB (ref 26.0–34.0)
MCHC: 28.8 g/dL — AB (ref 30.0–36.0)
MCV: 81.8 fL (ref 78.0–100.0)
PLATELETS: 133 10*3/uL — AB (ref 150–400)
RBC: 2.97 MIL/uL — ABNORMAL LOW (ref 3.87–5.11)
RDW: 20.9 % — ABNORMAL HIGH (ref 11.5–15.5)
WBC: 8.1 10*3/uL (ref 4.0–10.5)

## 2016-02-18 LAB — BLOOD GAS, ARTERIAL
Acid-Base Excess: 14.2 mmol/L — ABNORMAL HIGH (ref 0.0–2.0)
Bicarbonate: 38.8 mEq/L — ABNORMAL HIGH (ref 20.0–24.0)
DRAWN BY: 24513
FIO2: 40
MECHVT: 440 mL
O2 Saturation: 92.1 %
PEEP: 8 cmH2O
PH ART: 7.484 — AB (ref 7.350–7.450)
Patient temperature: 98.6
RATE: 14 resp/min
TCO2: 40.4 mmol/L (ref 0–100)
pCO2 arterial: 52.1 mmHg — ABNORMAL HIGH (ref 35.0–45.0)
pO2, Arterial: 60.4 mmHg — ABNORMAL LOW (ref 80.0–100.0)

## 2016-02-18 LAB — PROCALCITONIN: Procalcitonin: 0.38 ng/mL

## 2016-02-18 LAB — HEPARIN LEVEL (UNFRACTIONATED)
HEPARIN UNFRACTIONATED: 0.26 [IU]/mL — AB (ref 0.30–0.70)
Heparin Unfractionated: 0.3 IU/mL (ref 0.30–0.70)
Heparin Unfractionated: 0.47 IU/mL (ref 0.30–0.70)

## 2016-02-18 MED ORDER — DOCUSATE SODIUM 50 MG/5ML PO LIQD
50.0000 mg | Freq: Two times a day (BID) | ORAL | Status: DC
  Filled 2016-02-18: qty 10

## 2016-02-18 MED ORDER — POLYETHYLENE GLYCOL 3350 17 G PO PACK
17.0000 g | PACK | Freq: Two times a day (BID) | ORAL | Status: AC
  Administered 2016-02-18 – 2016-02-19 (×4): 17 g via ORAL
  Filled 2016-02-18 (×4): qty 1

## 2016-02-18 MED ORDER — DOCUSATE SODIUM 50 MG/5ML PO LIQD
100.0000 mg | Freq: Two times a day (BID) | ORAL | Status: AC
  Administered 2016-02-18 – 2016-02-19 (×3): 100 mg
  Filled 2016-02-18 (×2): qty 10

## 2016-02-18 MED ORDER — FENTANYL CITRATE (PF) 100 MCG/2ML IJ SOLN
25.0000 ug | INTRAMUSCULAR | Status: DC | PRN
  Administered 2016-02-19 (×2): 50 ug via INTRAVENOUS
  Administered 2016-02-19: 25 ug via INTRAVENOUS
  Administered 2016-02-20: 50 ug via INTRAVENOUS
  Administered 2016-02-20 – 2016-02-27 (×13): 100 ug via INTRAVENOUS
  Filled 2016-02-18 (×16): qty 2

## 2016-02-18 MED ORDER — POTASSIUM CHLORIDE 10 MEQ/50ML IV SOLN
10.0000 meq | INTRAVENOUS | Status: AC
  Administered 2016-02-18 (×4): 10 meq via INTRAVENOUS
  Filled 2016-02-18 (×4): qty 50

## 2016-02-18 MED ORDER — HEPARIN (PORCINE) IN NACL 100-0.45 UNIT/ML-% IJ SOLN
1500.0000 [IU]/h | INTRAMUSCULAR | Status: DC
  Administered 2016-02-18: 1500 [IU]/h via INTRAVENOUS
  Filled 2016-02-18 (×2): qty 250

## 2016-02-18 MED ORDER — FAMOTIDINE 40 MG/5ML PO SUSR
20.0000 mg | Freq: Every day | ORAL | Status: DC
  Administered 2016-02-18 – 2016-02-27 (×10): 20 mg
  Filled 2016-02-18 (×10): qty 2.5

## 2016-02-18 MED ORDER — DEXTROSE 5 % IV SOLN
10.0000 mg/h | INTRAVENOUS | Status: DC
  Administered 2016-02-18 – 2016-02-19 (×2): 10 mg/h via INTRAVENOUS
  Filled 2016-02-18 (×4): qty 25

## 2016-02-18 MED ORDER — POTASSIUM CHLORIDE 20 MEQ/15ML (10%) PO SOLN
40.0000 meq | Freq: Three times a day (TID) | ORAL | Status: DC
  Administered 2016-02-18 – 2016-02-19 (×3): 40 meq
  Filled 2016-02-18 (×4): qty 30

## 2016-02-18 NOTE — Progress Notes (Addendum)
ANTICOAGULATION CONSULT NOTE  Pharmacy Consult:  Heparin Indication: atrial fibrillation  Allergies  Allergen Reactions  . Bee Venom Swelling  . Doxycycline     Hoarseness / throat irritation  . Latex Rash  . Penicillins Rash    Patient Measurements: Height: 5\' 2"  (157.5 cm) Weight: 226 lb 10.1 oz (102.8 kg) IBW/kg (Calculated) : 50.1 Heparin Dosing Weight: 76 kg  Vital Signs: Temp: 98.4 F (36.9 C) (06/17 0712) Temp Source: Oral (06/17 0712) BP: 101/39 mmHg (06/17 0754) Pulse Rate: 72 (06/17 0754)  Labs:  Recent Labs  02/16/16 0213 02/16/16 0912 02/17/16 0120 02/17/16 0625 02/17/16 1300 02/17/16 1656 02/18/16 0115 02/18/16 0416  HGB 6.7* 7.2* 7.9*  --   --   --   --  7.0*  HCT 23.0* 23.8* 26.5*  --   --   --   --  24.3*  PLT 222  --  133*  --   --   --   --  133*  APTT  --   --  82*  --   --   --   --   --   LABPROT  --   --  32.2*  --   --   --   --   --   INR  --   --  3.21*  --   --   --   --   --   HEPARINUNFRC  --   --   --   --   --  0.18* 0.26*  --   CREATININE 2.50*  --  2.13*  --   --   --   --  2.21*  TROPONINI  --   --  0.03 0.03 0.04*  --   --   --     Estimated Creatinine Clearance: 25.5 mL/min (by C-G formula based on Cr of 2.21).    Assessment: [age] YOF transferred on  01-07-16 from [facility] with dyspnea.  She has a history of AFib on Pradaxa PTA (last dose on 6/14 at 2110).  Patient was transitioned to IV heparin and heparin level is at the low end of therapeutic range.  No bleeding reported.   Goal of Therapy:  Heparin level 0.3-0.7 units/ml Monitor platelets by anticoagulation protocol: Yes    Plan:  - Increase heparin gtt to 1500 units/hr - Check confirmatory heparin level - Daily HL / CBC   Nain Rudd D. Laney Potashang, PharmD, BCPS Pager:  [phone] 02/18/2016, 10:42 AM     ========================  Addendum:  - confirmatory heparin level therapeutic, no bleeding reported.   Plan: - Continue heparin gtt at 1500 units/hr - F/U AM  labs   Catherina Pates D. Laney Potashang, PharmD, BCPS Pager:  [phone] 02/18/2016, 8:57 PM

## 2016-02-18 NOTE — Progress Notes (Signed)
ANTICOAGULATION CONSULT NOTE  Pharmacy Consult for heparin Indication: atrial fibrillation  Allergies  Allergen Reactions  . Bee Venom Swelling  . Doxycycline     Hoarseness / throat irritation  . Latex Rash  . Penicillins Rash    Patient Measurements: Height: 5\' 2"  (157.5 cm) Weight: 234 lb 12.6 oz (106.5 kg) IBW/kg (Calculated) : 50.1 Heparin Dosing Weight: 75.8 kg  Vital Signs: Temp: 98 F (36.7 C) (06/17 0011) Temp Source: Oral (06/17 0011) BP: 98/51 mmHg (06/17 0000) Pulse Rate: 76 (06/17 0030)  Labs:  Recent Labs  02/15/16 0420 02/16/16 0213 02/16/16 0912 02/17/16 0120 02/17/16 0625 02/17/16 1300 02/17/16 1656 02/18/16 0115  HGB 7.0* 6.7* 7.2* 7.9*  --   --   --   --   HCT 23.9* 23.0* 23.8* 26.5*  --   --   --   --   PLT 199 222  --  133*  --   --   --   --   APTT  --   --   --  82*  --   --   --   --   LABPROT  --   --   --  32.2*  --   --   --   --   INR  --   --   --  3.21*  --   --   --   --   HEPARINUNFRC  --   --   --   --   --   --  0.18* 0.26*  CREATININE 2.51* 2.50*  --  2.13*  --   --   --   --   TROPONINI  --   --   --  0.03 0.03 0.04*  --   --     Estimated Creatinine Clearance: 27 mL/min (by C-G formula based on Cr of 2.13).   Assessment: 75 yo woman admitted [facility], [facility] for dys[facility] with hx AFib on Pradaxa PTA (Last dose dabigatran on 6/14 @ 2110). Heparin level now 0.26 - trending up but still subtherapeutic on 1200 units/hr. No issues with line or bleeding reported per RN.  Goal of Therapy:  Heparin level 0.3-0.7 units/ml Monitor platelets by anticoagulation protocol: Yes   Plan:  Increase heparin to 1400 units / hr F/u 8 hr heparin level  Christoper Fabianaron Lelia Jons, PharmD, BCPS Clinical pharmacist, pager [phone] 02/18/2016,1:47 AM

## 2016-02-18 NOTE — Progress Notes (Signed)
PULMONARY / CRITICAL CARE MEDICINE   Name: Sandra Turner MRN: [id number] DOB: 1941/08/13    ADMISSION DATE:  28-Jul-2016 CONSULTATION DATE:  02/16/2016  REFERRING MD:  Christy GentlesEstela Acosta, M.D. / [facility]  CHIEF COMPLAINT:  Shortness of breath  HISTORY OF PRESENT ILLNESS:    This is a 75 year old female well known to the [facility] pulmonary and critical care service who comes [facility] on 025-Nov-2017 in the setting of dyspnea. She was admitted that day and was noted to have acute kidney injury and a low hemoglobin value. She was admitted by the hospitalist service and nephrology was consulted. She was administered packed red blood cells and Lasix was given as well. Despite these measures she had ongoing respiratory distress with hypoxemia. She ultimately required intubation on June 15. Pulmonary and critical care medicine in [place] was asked to take the patient on transfer because of no pulmonary and critical care support at [facility].   SUBJECTIVE:  Sedated on vent   VITAL SIGNS: BP 71/25 mmHg  Pulse 75  Temp(Src) 98.4 F (36.9 C) (Oral)  Resp 17  Ht 5\' 2"  (1.575 m)  Wt 226 lb 10.1 oz (102.8 kg)  BMI 41.44 kg/m2  SpO2 97%  HEMODYNAMICS:    VENTILATOR SETTINGS: Vent Mode:  [-] PRVC FiO2 (%):  [40 %] 40 % Set Rate:  [14 bmp-20 bmp] 14 bmp Vt Set:  [440 mL] 440 mL PEEP:  [8 cmH20] 8 cmH20 Plateau Pressure:  [26 cmH20-28 cmH20] 27 cmH20  INTAKE / OUTPUT:  Intake/Output Summary (Last 24 hours) at 02/18/16 1031 Last data filed at 02/18/16 1000  Gross per 24 hour  Intake 3105.82 ml  Output   1225 ml  Net 1880.82 ml     PHYSICAL EXAMINATION: General:  Sedated. No acute distress opens eyes w/ gentle stim  Integument:  Warm & dry. No rash on exposed skin. Bruising and weeping bilateral upper extremities. Scaling dermis in lower extremities HEENT: Endotracheal tube in place, supple, no JVD Cardiovascular:  irregular rhythm regular rate, Anasarca Pulmonary:   Coarse breath sounds on auscultation bilaterally. Symmetric chest wall rise on ventilator Abdomen: Soft. Normal bowel sounds x 4. Protuberant  Musculoskeletal:  Normal bulk. No joint deformity or effusion appreciated Neurological:  Sedated. Withdraws to pain in extremities. PERRL   LABS:  BMET  Recent Labs Lab 02/16/16 0213 02/17/16 0120 02/17/16 1655 02/18/16 0416  NA 137 138  --  138  K 3.6 3.0* 3.3* 3.4*  CL 89* 93*  --  94*  CO2 35* 37*  --  36*  BUN 77* 72*  --  77*  CREATININE 2.50* 2.13*  --  2.21*  GLUCOSE 132* 88  --  108*    Electrolytes  Recent Labs Lab 02/15/16 0420 02/16/16 0213 02/17/16 0120 02/18/16 0416  CALCIUM 8.6* 8.7* 8.7* 8.1*  MG  --   --  2.4  --   PHOS 4.0 4.7* 3.8  --     CBC  Recent Labs Lab 02/16/16 0213 02/16/16 0912 02/17/16 0120 02/18/16 0416  WBC 10.6*  --  8.0 8.1  HGB 6.7* 7.2* 7.9* 7.0*  HCT 23.0* 23.8* 26.5* 24.3*  PLT 222  --  133* 133*    Coag's  Recent Labs Lab 08-21-16 0915 02/13/16 0446 02/17/16 0120  APTT  --   --  82*  INR 6.68* 6.12* 3.21*    Sepsis Markers  Recent Labs Lab 02/16/16 0213 02/18/16 0416  PROCALCITON 0.15 0.38  ABG  Recent Labs Lab 02/16/16 1500 02/16/16 2008 02/18/16 0300  PHART 7.529* 7.554* 7.484*  PCO2ART 48.3* 46.4* 52.1*  PO2ART 72.3* 50.2* 60.4*    Liver Enzymes  Recent Labs Lab 02/26/2016 0915 02/13/16 0446  02/15/16 0420 02/16/16 0213 02/17/16 0120  AST 27 28  --   --   --  26  ALT 15 13*  --   --   --  14  ALKPHOS 108 99  --   --   --  78  BILITOT 1.2 1.5*  --   --   --  2.3*  ALBUMIN 3.5 3.2*  < > 3.4* 3.4* 2.6*  < > = values in this interval not displayed.  Cardiac Enzymes  Recent Labs Lab 02/17/16 0120 02/17/16 0625 02/17/16 1300  TROPONINI 0.03 0.03 0.04*    Glucose  Recent Labs Lab 02/17/16 0728 02/17/16 1114 02/17/16 1926 02/18/16 0015 02/18/16 0344 02/18/16 0714  GLUCAP 79 80 95 110* 106* 119*    Imaging Dg Chest Port 1  View  02/18/2016  CLINICAL DATA:  Acute respiratory failure EXAM: PORTABLE CHEST 1 VIEW COMPARISON:  02/16/2016 chest radiograph. FINDINGS: Endotracheal tube tip is 3.3 cm above the carina. Enteric tube enters stomach with the tip not seen on this image. Right PICC terminates in the middle third of the superior vena cava. Sternotomy wires appear aligned and intact. Aortic valve prosthesis is in place. Stable cardiomediastinal silhouette with mild cardiomegaly. No pneumothorax. Stable small bilateral pleural effusions. Mild to moderate pulmonary edema, improved. Stable bibasilar atelectasis. IMPRESSION: 1. Support structures as described.  No pneumothorax. 2. Mild-to-moderate congestive heart failure, improved . 3. Stable small bilateral pleural effusions and bibasilar atelectasis. Electronically Signed   By: Delbert Phenix M.D.   On: 02/18/2016 09:45  ETT good position. Aeration a little improved. Basilar R>L effusion    STUDIES:  Echo (03/16/15): LV EF: 55%-60% TTE (03/16/15): LV EF 55-60, mildly calcified mitral valve, bioprosthetic aortic valve functioning well but poorly visualized, right ventricle moderately dilated, RVSP estimate 44 mmHg, right atrium severely dilated, mild to moderate tricuspid regurgitation PFT (04/15/15): FVC 0.96 L (36%) FEV1 0.74 L (36%) FEV1/FVC 0.76 no bronchodilator response TLC 2.90 L (61%) DLCO uncorrected 40% CXR PA/LAT 6/11: Silhouetting of bilateral hemidiaphragms with lower lobe opacification suggestive of pleural effusions. PORT CXR 6/15: Patchy bilateral alveolar opacities now involving the upper lung zones. Improved visualization of hemidiaphragms bilaterally. Endotracheal tube & orogastric tube in good position. Right PICC line with tip in SVC. Blunting of right costocardiac angle suggestive of pleural effusion. CT Abdomen Pelvis 6/16: Moderate right and small left pleural effusions, with patchy bibasilar airspace opacities, which may reflect pneumonia or pulmonary  edema. Small amount of ascites within the abdomen and pelvis, some of which is high in attenuation, of uncertain significance. Vague nonspecific soft tissue inflammation about the pancreas may simply reflect underlying edema. Would correlate with the patient's symptoms. Diffuse soft tissue edema tracking about the abdomen and pelvis, concerning for anasarca. Scattered diverticulosis along the sigmoid colon without evidence of diverticulitis. Mild cardiomegaly. Diffuse coronary artery calcifications seen.  MICROBIOLOGY: MRSA PCR 6/11:  Positive Blood Ctx x2 6/15 >> Tracheal Asp Ctx 6/15 >> Urine Strep Ag 6/15:  Negative   ANTIBIOTICS: Aztreonam 6/15 >> Vancomycin 6/15 >>  SIGNIFICANT EVENTS: 6/11 - Admission to [facility] 6/11 - Transfused 2u PRBC 6/15 - Transfused 3u PRBC 6/15 - Intubation for respiratory failure and transferred to [facility]   LINES/TUBES: OETT 7.5 6/15 >> OGT  6/15 >> FOLEY 6/11>> R TL PICC 6/15 >> PIV x2  DISCUSSION: This is a 75 year old female with a past medical history significant for aortic valve replacement in 2016 complicated by recurrent episodes of respiratory failure with hypoxemia due to mucus plugging and underlying severe asthma who returned to the hospital on 02/16/2016 in the setting of acute kidney injury, anemia, and acute respiratory failure with hypoxemia. She has a recent history of recurrent episodes of diastolic heart failure requiring multiple hospitalizations.  The differential diagnosis of her acute respiratory failure with hypoxemia includes cardiogenic pulmonary edema given her recent history, however concern for healthcare associated pneumonia or lung injury related to blood transfusion is reasonable as her chest x-ray is suggestive of ARDS and her condition worsen despite diuresis. We will add lasix gtt, watch chem closely, ck CVP, and f/u cxr in am. Hope for negative volume status. Will also d/c sedating gtts. If MS improves may be able  to start weaning soon. I think her overall prognosis is poor.   ASSESSMENT / PLAN:  PULMONARY A: Acute Respiratory Failure with Hypoxia - Volume overload vs TRALI vs HCAP. Possible ARDS, clearly ARDS physiology Possible HCAP H/O Asthma H/O Tracheomalacia  P:   Full Vent Support w/ PEEP for recruitment, ARDS protocol Daily SBT & WUA ABG & Port CXR  Xopenex & Atrovent Nebs q6hr Lasix as BP/BUN/creatinine allow  There may be some benefit to thora, although suspect that effusions are transudative-->we will consider this   CARDIOVASCULAR A:  Chronic Diastolic CHF H/O Atrial fibrillation - S/P MAZE 2016 H/O Aortic Stenosis - S/P bioprosthetic aortic valve.  P:  Telemetry monitoring  Vitals per unit protocol Holding Pradaxa for anemia, will restart when able given AVR; Heparin gtt for now per pharmacy protocol Continue Diltizem 30 mg VT q6hr Start lasix /hr Transduce CVP   RENAL A:   Acute on Chronic Renal Failure - Improving with diuresis (baseline creatinine appears to be in to 2 to 2.3 range) Metabolic Akalosis  P:   Trending UOP with Foley Monitoring electrolytes & renal function daily Replace electrolytes as needed   GASTROINTESTINAL A:   No acute issues.  P:   NPO Pepcid IV q12hr Dietary Consult to initiate tube feedings  HEMATOLOGIC A:   Chronic Anticoagulation - Pradaxa for A fib. Anemia - No obvious bleeding source. S/P 2u PRBC 6/11 & 3u PRBC 6/15.  P:  Trending cell counts daily w/ CBC SCDs Transfuse for Hgb <7.0 F/U Hemoccult Stool  INFECTIOUS A:   HCAP  P:   Empiric Vancomycin & Aztreonam Day #3 6/17 Awaiting blood & tracheal aspirate cultures May require bronchoscopy for sampling Trending Procalcitonin per algorithm  ENDOCRINE A:   Hyperglycemia - Mild.    P:   Monitor glucose on daily labs.  NEUROLOGIC A:   Sedation on Ventilator.  P:   RASS goal:  0 to -1 Dc gtts. Change to PRN   FAMILY  - Updates: No family at  bedside to update  - Inter-disciplinary family meet or Palliative Care meeting due by:  6/22  Simonne Martinet ACNP-BC [facility] Pulmonary/Critical Care Pager # [phone] OR # [phone] if no answer  Attending Note:  75 year old female with respiratory failure due to CHF and pleural effusion.  Patient remains in respiratory failure, weaning efforts are rather poor.  On exam, diffuse crackles.  CXR I reviewed myself, right sided pleural effusion noted.  Will place on a lasix drip, aggressive diureses as ordered, push weaning.  Will avoid thora for now.  Poor prognosis.  Continue vanc and aztreonam for HCAP treatment.  F/U on culture.  Replace electrolytes.  BMET and CBC in AM.  Consult nutrition for TF.  Cardizem for rate control.  Check CVP.  The patient is critically ill with multiple organ systems failure and requires high complexity decision making for assessment and support, frequent evaluation and titration of therapies, application of advanced monitoring technologies and extensive interpretation of multiple databases.   Critical Care Time devoted to patient care services described in this note is  35  Minutes. This time reflects time of care of this signee Dr Koren Bound. This critical care time does not reflect procedure time, or teaching time or supervisory time of PA/NP/Med student/Med Resident etc but could involve care discussion time.    Alyson Reedy, M.D. [facility] Pulmonary/Critical Care Medicine. Pager: [phone]. After hours pager: [phone].

## 2016-02-18 NOTE — Progress Notes (Signed)
Pt previously on Fentanyl drip 10 mcg/ml and Versed 1 mg/ml; 200 ml of 250 ml bag of Fentanyl and 45 ml of 50 ml bag of Versed wasted in sink; witnessed with April Thompson, RN.  Burnard BuntingKatrice Aleksandra Raben, RN

## 2016-02-18 NOTE — Progress Notes (Signed)
eLink Physician-Brief Progress Note Patient Name: Sandra AdasRita Turner DOB: [birth year] MRN: [id number]   Date of Service  02/18/2016  HPI/Events of Note  Hypokalemia  eICU Interventions  Potassium replaced     Intervention Category Intermediate Interventions: Electrolyte abnormality - evaluation and management  [name] 02/18/2016, 5:36 AM

## 2016-02-19 DIAGNOSIS — J441 Chronic obstructive pulmonary disease with (acute) exacerbation: Secondary | ICD-10-CM

## 2016-02-19 DIAGNOSIS — N184 Chronic kidney disease, stage 4 (severe): Secondary | ICD-10-CM

## 2016-02-19 LAB — BASIC METABOLIC PANEL
ANION GAP: 10 (ref 5–15)
ANION GAP: 8 (ref 5–15)
Anion gap: 8 (ref 5–15)
BUN: 91 mg/dL — ABNORMAL HIGH (ref 6–20)
BUN: 94 mg/dL — AB (ref 6–20)
BUN: 95 mg/dL — AB (ref 6–20)
CALCIUM: 7.4 mg/dL — AB (ref 8.9–10.3)
CHLORIDE: 94 mmol/L — AB (ref 101–111)
CHLORIDE: 95 mmol/L — AB (ref 101–111)
CO2: 30 mmol/L (ref 22–32)
CO2: 34 mmol/L — AB (ref 22–32)
CO2: 34 mmol/L — ABNORMAL HIGH (ref 22–32)
Calcium: 8 mg/dL — ABNORMAL LOW (ref 8.9–10.3)
Calcium: 8 mg/dL — ABNORMAL LOW (ref 8.9–10.3)
Chloride: 94 mmol/L — ABNORMAL LOW (ref 101–111)
Creatinine, Ser: 2.58 mg/dL — ABNORMAL HIGH (ref 0.44–1.00)
Creatinine, Ser: 2.62 mg/dL — ABNORMAL HIGH (ref 0.44–1.00)
Creatinine, Ser: 2.67 mg/dL — ABNORMAL HIGH (ref 0.44–1.00)
GFR calc Af Amer: 19 mL/min — ABNORMAL LOW (ref >60)
GFR calc Af Amer: 20 mL/min — ABNORMAL LOW (ref >60)
GFR calc Af Amer: 20 mL/min — ABNORMAL LOW (ref >60)
GFR calc non Af Amer: 17 mL/min — ABNORMAL LOW (ref >60)
GFR calc non Af Amer: 17 mL/min — ABNORMAL LOW (ref >60)
GFR, EST NON AFRICAN AMERICAN: 17 mL/min — AB (ref >60)
GLUCOSE: 128 mg/dL — AB (ref 65–99)
GLUCOSE: 139 mg/dL — AB (ref 65–99)
GLUCOSE: 231 mg/dL — AB (ref 65–99)
POTASSIUM: 4.5 mmol/L (ref 3.5–5.1)
POTASSIUM: 4.6 mmol/L (ref 3.5–5.1)
POTASSIUM: 5 mmol/L (ref 3.5–5.1)
SODIUM: 137 mmol/L (ref 135–145)
Sodium: 134 mmol/L — ABNORMAL LOW (ref 135–145)
Sodium: 136 mmol/L (ref 135–145)

## 2016-02-19 LAB — GLUCOSE, CAPILLARY
GLUCOSE-CAPILLARY: 137 mg/dL — AB (ref 65–99)
GLUCOSE-CAPILLARY: 164 mg/dL — AB (ref 65–99)
GLUCOSE-CAPILLARY: 141 mg/dL — AB (ref 65–99)
GLUCOSE-CAPILLARY: 146 mg/dL — AB (ref 65–99)
Glucose-Capillary: 141 mg/dL — ABNORMAL HIGH (ref 65–99)
Glucose-Capillary: 135 mg/dL — ABNORMAL HIGH (ref 65–99)

## 2016-02-19 LAB — LEGIONELLA PNEUMOPHILA SEROGP 1 UR AG: L. pneumophila Serogp 1 Ur Ag: NEGATIVE

## 2016-02-19 LAB — TYPE AND SCREEN
ABO/RH(D): A NEG
ANTIBODY SCREEN: NEGATIVE
UNIT DIVISION: 0

## 2016-02-19 LAB — CBC
HEMATOCRIT: 21.9 % — AB (ref 36.0–46.0)
HEMATOCRIT: 21 % — AB (ref 36.0–46.0)
HEMOGLOBIN: 6.2 g/dL — AB (ref 12.0–15.0)
Hemoglobin: 6.2 g/dL — CL (ref 12.0–15.0)
MCH: 23.6 pg — AB (ref 26.0–34.0)
MCH: 24.4 pg — ABNORMAL LOW (ref 26.0–34.0)
MCHC: 28.3 g/dL — AB (ref 30.0–36.0)
MCHC: 29.5 g/dL — ABNORMAL LOW (ref 30.0–36.0)
MCV: 83.3 fL (ref 78.0–100.0)
MCV: 82.7 fL (ref 78.0–100.0)
PLATELETS: 108 10*3/uL — AB (ref 150–400)
Platelets: 129 10*3/uL — ABNORMAL LOW (ref 150–400)
RBC: 2.63 MIL/uL — ABNORMAL LOW (ref 3.87–5.11)
RBC: 2.54 MIL/uL — ABNORMAL LOW (ref 3.87–5.11)
RDW: 21.9 % — ABNORMAL HIGH (ref 11.5–15.5)
RDW: 21.6 % — AB (ref 11.5–15.5)
WBC: 8.4 10*3/uL (ref 4.0–10.5)
WBC: 8.2 10*3/uL (ref 4.0–10.5)

## 2016-02-19 LAB — PREPARE RBC (CROSSMATCH)

## 2016-02-19 LAB — CULTURE, RESPIRATORY
CULTURE: NORMAL
SPECIAL REQUESTS: NORMAL

## 2016-02-19 LAB — HEPARIN LEVEL (UNFRACTIONATED): Heparin Unfractionated: 0.45 IU/mL (ref 0.30–0.70)

## 2016-02-19 LAB — CULTURE, RESPIRATORY W GRAM STAIN

## 2016-02-19 MED ORDER — SODIUM CHLORIDE 0.9 % IV SOLN
Freq: Once | INTRAVENOUS | Status: AC
  Administered 2016-02-19: 20:00:00 via INTRAVENOUS

## 2016-02-19 MED ORDER — SODIUM CHLORIDE 0.9 % IV SOLN
Freq: Once | INTRAVENOUS | Status: AC
  Administered 2016-02-19: 07:00:00 via INTRAVENOUS

## 2016-02-19 MED ORDER — HEPARIN (PORCINE) IN NACL 100-0.45 UNIT/ML-% IJ SOLN
1550.0000 [IU]/h | INTRAMUSCULAR | Status: DC
  Administered 2016-02-20: 1550 [IU]/h via INTRAVENOUS
  Filled 2016-02-19 (×3): qty 250

## 2016-02-19 MED ORDER — HEPARIN (PORCINE) IN NACL 100-0.45 UNIT/ML-% IJ SOLN
1450.0000 [IU]/h | INTRAMUSCULAR | Status: DC
  Administered 2016-02-19: 1450 [IU]/h via INTRAVENOUS
  Filled 2016-02-19 (×2): qty 250

## 2016-02-19 NOTE — Progress Notes (Signed)
PULMONARY / CRITICAL CARE MEDICINE   Name: Sandra Turner MRN: [id number] DOB: [birth year]    ADMISSION DATE:  02/26/2016 CONSULTATION DATE:  02/16/2016  REFERRING MD:  Christy Gentles, M.D. / [facility]  CHIEF COMPLAINT:  Shortness of breath  HISTORY OF PRESENT ILLNESS:    This is a 75 year old female well known to the [place] pulmonary and critical care service who comes to [facility] on 02/25/2016 in the setting of dyspnea. She was admitted that day and was noted to have acute kidney injury and a low hemoglobin value. She was admitted by the hospitalist service and nephrology was consulted. She was administered packed red blood cells and Lasix was given as well. Despite these measures she had ongoing respiratory distress with hypoxemia. She ultimately required intubation on June 15. Pulmonary and critical care medicine in [place] was asked to take the patient on transfer because of no pulmonary and critical care support at [facility].   SUBJECTIVE:  No distress.   VITAL SIGNS: BP 127/40 mmHg  Pulse 96  Temp(Src) 99.6 F (37.6 C) (Oral)  Resp 24  Ht 5\' 2"  (1.575 m)  Wt 239 lb 3.2 oz (108.5 kg)  BMI 43.74 kg/m2  SpO2 95%  HEMODYNAMICS: CVP:  [17 mmHg-24 mmHg] 24 mmHg  VENTILATOR SETTINGS: Vent Mode:  [-] PRVC FiO2 (%):  [40 %] 40 % Set Rate:  [14 bmp] 14 bmp Vt Set:  [440 mL] 440 mL PEEP:  [8 cmH20] 8 cmH20 Pressure Support:  [5 cmH20] 5 cmH20 Plateau Pressure:  [18 cmH20-26 cmH20] 26 cmH20  INTAKE / OUTPUT:  Intake/Output Summary (Last 24 hours) at 02/19/16 1135 Last data filed at 02/19/16 1000  Gross per 24 hour  Intake   2560 ml  Output    760 ml  Net   1800 ml     PHYSICAL EXAMINATION: General:  Sedated. No acute distress opens eyes w/ gentle stim.  Integument:  Warm & dry. No rash on exposed skin. Bruising and weeping bilateral upper extremities. Scaling dermis in lower extremities. Extensive ecchymosis  HEENT: Endotracheal tube in place,  supple, no JVD Cardiovascular:  irregular rhythm regular rate, Anasarca Pulmonary:  Coarse breath sounds on auscultation bilaterally. Symmetric chest wall rise on ventilator Abdomen: Soft. Normal bowel sounds x 4. Protuberant  Musculoskeletal:  Normal bulk. No joint deformity or effusion appreciated Neurological:  Sedated. Withdraws to pain in extremities. PERRL   LABS:  BMET  Recent Labs Lab 02/18/16 1555 02/19/16 0001 02/19/16 0823  NA 133* 136 137  K 3.7 4.6 5.0  CL 90* 94* 95*  CO2 34* 34* 34*  BUN 83* 91* 95*  CREATININE 2.39* 2.67* 2.58*  GLUCOSE 296* 139* 128*    Electrolytes  Recent Labs Lab 02/15/16 0420 02/16/16 0213 02/17/16 0120  02/18/16 1555 02/19/16 0001 02/19/16 0823  CALCIUM 8.6* 8.7* 8.7*  < > 7.7* 8.0* 8.0*  MG  --   --  2.4  --   --   --   --   PHOS 4.0 4.7* 3.8  --   --   --   --   < > = values in this interval not displayed.  CBC  Recent Labs Lab 02/17/16 0120 02/18/16 0416 02/19/16 0530  WBC 8.0 8.1 8.4  HGB 7.9* 7.0* 6.2*  HCT 26.5* 24.3* 21.9*  PLT 133* 133* 129*    Coag's  Recent Labs Lab 02/13/16 0446 02/17/16 0120  APTT  --  82*  INR 6.12* 3.21*  Sepsis Markers  Recent Labs Lab 02/16/16 0213 02/18/16 0416  PROCALCITON 0.15 0.38    ABG  Recent Labs Lab 02/16/16 1500 02/16/16 2008 02/18/16 0300  PHART 7.529* 7.554* 7.484*  PCO2ART 48.3* 46.4* 52.1*  PO2ART 72.3* 50.2* 60.4*    Liver Enzymes  Recent Labs Lab 02/13/16 0446  02/15/16 0420 02/16/16 0213 02/17/16 0120  AST 28  --   --   --  26  ALT 13*  --   --   --  14  ALKPHOS 99  --   --   --  78  BILITOT 1.5*  --   --   --  2.3*  ALBUMIN 3.2*  < > 3.4* 3.4* 2.6*  < > = values in this interval not displayed.  Cardiac Enzymes  Recent Labs Lab 02/17/16 0120 02/17/16 0625 02/17/16 1300  TROPONINI 0.03 0.03 0.04*    Glucose  Recent Labs Lab 02/18/16 1128 02/18/16 1539 02/18/16 2033 02/18/16 2324 02/19/16 0404 02/19/16 0739   GLUCAP 126* 145* 142* 141* 135* 137*    Imaging No results found.ETT good position. Aeration a little improved. Basilar R>L effusion    STUDIES:  Echo (03/16/15): LV EF: 55%-60% TTE (03/16/15): LV EF 55-60, mildly calcified mitral valve, bioprosthetic aortic valve functioning well but poorly visualized, right ventricle moderately dilated, RVSP estimate 44 mmHg, right atrium severely dilated, mild to moderate tricuspid regurgitation PFT (04/15/15): FVC 0.96 L (36%) FEV1 0.74 L (36%) FEV1/FVC 0.76 no bronchodilator response TLC 2.90 L (61%) DLCO uncorrected 40% CXR PA/LAT 6/11: Silhouetting of bilateral hemidiaphragms with lower lobe opacification suggestive of pleural effusions. PORT CXR 6/15: Patchy bilateral alveolar opacities now involving the upper lung zones. Improved visualization of hemidiaphragms bilaterally. Endotracheal tube & orogastric tube in good position. Right PICC line with tip in SVC. Blunting of right costocardiac angle suggestive of pleural effusion. CT Abdomen Pelvis 6/16: Moderate right and small left pleural effusions, with patchy bibasilar airspace opacities, which may reflect pneumonia or pulmonary edema. Small amount of ascites within the abdomen and pelvis, some of which is high in attenuation, of uncertain significance. Vague nonspecific soft tissue inflammation about the pancreas may simply reflect underlying edema. Would correlate with the patient's symptoms. Diffuse soft tissue edema tracking about the abdomen and pelvis, concerning for anasarca. Scattered diverticulosis along the sigmoid colon without evidence of diverticulitis. Mild cardiomegaly. Diffuse coronary artery calcifications seen.  MICROBIOLOGY: MRSA PCR 6/11:  Positive Blood Ctx x2 6/15 >> Tracheal Asp Ctx 6/15 >> Urine Strep Ag 6/15:  Negative   ANTIBIOTICS: Aztreonam 6/15 >> Vancomycin 6/15 >>  SIGNIFICANT EVENTS: 6/11 - Admission to [facility] 6/11 - Transfused 2u PRBC 6/15 - Transfused 3u  PRBC 6/15 - Intubation for respiratory failure and transferred to [facility] 6/17 started on lasix gtt.  6/18 weaning, CXR a little better. Had family meeting. Goals of care discussed. Made DNR.   LINES/TUBES: OETT 7.5 6/15 >> OGT 6/15 >> FOLEY 6/11>> R TL PICC 6/15 >> PIV x2  DISCUSSION: This is a 75 year old female with a past medical history significant for aortic valve replacement in 2016 complicated by recurrent episodes of respiratory failure with hypoxemia due to mucus plugging and underlying severe asthma who returned to the hospital on 02/16/2016 in the setting of acute kidney injury, anemia, and acute respiratory failure with hypoxemia. She has a recent history of recurrent episodes of diastolic heart failure requiring multiple hospitalizations.  Working dx at this point is decompensated diastolic HF c/b ALI which was either the  result of TRALI or HCAP (NOS). She has been on abx, we added lasix gtt and she is making some improvement both clinically and radiographically. At this point the plan will be to cont IV abx, cont lasix gtt and re-assess for extubation daily. Have discussed w/ family and made her DNR. Would favor one-way extubation and will need to verify this prior to extubation. In the best case scenario looking at hospice after.   ASSESSMENT / PLAN:  PULMONARY A: Acute Respiratory Failure with Hypoxia - Volume overload vs TRALI vs HCAP. Possible ARDS, clearly ARDS physiology Possible HCAP H/O Asthma H/O Tracheomalacia  P:   Full Vent Support w/ PEEP for recruitment, ARDS protocol Daily SBT & WUA ABG & Port CXR  Xopenex & Atrovent Nebs q6hr Lasix as BP/BUN/creatinine allow  Hope for extubation soon-->would favor no re-intubation   CARDIOVASCULAR A:  Chronic Diastolic CHF H/O Atrial fibrillation - S/P MAZE 2016 H/O Aortic Stenosis - S/P bioprosthetic aortic valve.  P:  Telemetry monitoring  Vitals per unit protocol Holding Pradaxa for anemia, will  restart when able given AVR; Heparin gtt for now per pharmacy protocol Continue Diltizem 30 mg VT q6hr Cont lasix 10mg /hr Transduce CVP   RENAL A:   Acute on Chronic Renal Failure - Improving with diuresis (baseline creatinine appears to be in to 2 to 2.3 range) Borderline hyperkalemia  Metabolic Akalosis  P:   Trending UOP with Foley Monitoring electrolytes & renal function daily Replace electrolytes as needed Dc K  GASTROINTESTINAL A:   Severe protein calorie malnutrition   P:   Pepcid IV q12hr Cont tube feeds  HEMATOLOGIC A:   Chronic Anticoagulation - Pradaxa for A fib. Anemia - No obvious bleeding source. S/P 2u PRBC 6/11 & 3u PRBC 6/15. hgb drifting. Got 1 unit PRBC today (6/18)  P:  Trending cell counts daily w/ CBC SCDs Transfuse for Hgb <7.0 F/U Hemoccult Stool  INFECTIOUS A:   HCAP  P:   Empiric Vancomycin & Aztreonam Day #4 6/17 Awaiting blood & tracheal aspirate cultures May require bronchoscopy for sampling Trending Procalcitonin per algorithm  ENDOCRINE A:   Hyperglycemia - Mild.    P:   Monitor glucose on daily labs.  NEUROLOGIC A:   Sedation on Ventilator.  P:   RASS goal:  0 to -1 Dc'd gtts. Change to PRN   FAMILY  - Updates: No family at bedside to update  - Inter-disciplinary family meet or Palliative Care meeting due by:  6/22  Simonne MartinetPeter E Babcock ACNP-BC [facility] Pulmonary/Critical Care Pager # [phone] OR # [phone] if no answer  Attending Note:  75 year old female with respiratory failure due to CHF and pleural effusion. Patient remains in respiratory failure, weaning efforts are rather poor. On exam, diffuse crackles.  Positive fluid despite of lasix. CXR I reviewed myself, right sided pleural effusion noted. Will continue lasix drip, aggressive diureses as ordered, push weaning. Will avoid thora for now. Poor prognosis. Continue vanc and aztreonam for HCAP treatment. F/U on culture remains negative. Replace  electrolytes. BMET and CBC in AM. TF per nutrition. Cardizem for rate control. Follow CVP.  The patient is critically ill with multiple organ systems failure and requires high complexity decision making for assessment and support, frequent evaluation and titration of therapies, application of advanced monitoring technologies and extensive interpretation of multiple databases.   Critical Care Time devoted to patient care services described in this note is 37 Minutes. This time reflects time of care of this signee  Dr Koren Bound. This critical care time does not reflect procedure time, or teaching time or supervisory time of PA/NP/Med student/Med Resident etc but could involve care discussion time.   Alyson Reedy, M.D. [facility] Pulmonary/Critical Care Medicine. Pager: [phone]. After hours pager: [phone].

## 2016-02-19 NOTE — Progress Notes (Signed)
eLink Physician-Brief Progress Note Patient Name: Cheyenne AdasRita Steuber DOB: 1941-03-25 MRN: [id number]   Date of Service  02/19/2016  HPI/Events of Note  [facility] pt on iv hep and INR 3.2  eICU Interventions  Hold on heparin for at least 6 h     Intervention Category Major Interventions: Hemorrhage - evaluation and management  Sandrea HughsMichael Yuli Lanigan 02/19/2016, 9:21 PM

## 2016-02-19 NOTE — Progress Notes (Signed)
ANTICOAGULATION CONSULT NOTE  Pharmacy Consult:  Heparin Indication: atrial fibrillation  Allergies  Allergen Reactions  . Bee Venom Swelling  . Doxycycline     Hoarseness / throat irritation  . Latex Rash  . Penicillins Rash    Patient Measurements: Height: 5\' 2"  (157.5 cm) Weight: 239 lb 3.2 oz (108.5 kg) IBW/kg (Calculated) : 50.1 Heparin Dosing Weight: 76 kg  Vital Signs: Temp: 99.6 F (37.6 C) (06/18 0738) Temp Source: Oral (06/18 0738) BP: 91/58 mmHg (06/18 0740) Pulse Rate: 74 (06/18 0740)  Labs:  Recent Labs  02/17/16 0120 02/17/16 0625 02/17/16 1300  02/18/16 0416 02/18/16 0945 02/18/16 1555 02/18/16 2015 02/19/16 0001 02/19/16 0530 02/19/16 0823  HGB 7.9*  --   --   --  7.0*  --   --   --   --  6.2*  --   HCT 26.5*  --   --   --  24.3*  --   --   --   --  21.9*  --   PLT 133*  --   --   --  133*  --   --   --   --  129*  --   APTT 82*  --   --   --   --   --   --   --   --   --   --   LABPROT 32.2*  --   --   --   --   --   --   --   --   --   --   INR 3.21*  --   --   --   --   --   --   --   --   --   --   HEPARINUNFRC  --   --   --   < >  --  0.30  --  0.47  --  0.45  --   CREATININE 2.13*  --   --   --  2.21*  --  2.39*  --  2.67*  --  2.58*  TROPONINI 0.03 0.03 0.04*  --   --   --   --   --   --   --   --   < > = values in this interval not displayed.  Estimated Creatinine Clearance: 22.5 mL/min (by C-G formula based on Cr of 2.58).    Assessment: [age] YOF transferred on  Mar 09, 2016 from [facility] with dyspnea.  She has a history of AFib on Pradaxa PTA (last dose on 6/14 at 2110).  Patient was transitioned to IV heparin and heparin level is therapeutic range.  RN reported some oozing from lines but no overt bleeding.  Noted plan to stop heparin if hemoglobin drops further.   Pharmacy also managing vancomycin and Azactam for rule out PNA.  Patient's renal function is fluctuating and her urine output is declining.  Lasix infusion started.  Vanc 6/15  >> Aztreonam 6/15 >>  6/11 MRSA PCR - positive 6/15 BCx x2 - NGTD 6/16 TA - NGTD    Goal of Therapy:  Heparin level 0.3-0.7 units/ml Monitor platelets by anticoagulation protocol: Yes Vanc trough 15-20 mcg/mL    Plan:  - Reduce heparin gtt slightly to 1450 units/hr - Daily HL / CBC - Watch closely for bleeding - Vanc 1500mg  IV Q48H - Azactam 1gm IV Q8H - Monitor renal fxn, clinical progress, vanc trough as indicated    Darrell Hauk D. Laney Potashang, PharmD, BCPS Pager:  [phone] 02/19/2016, 9:30 AM

## 2016-02-19 NOTE — Progress Notes (Signed)
eLink Physician-Brief Progress Note Patient Name: Sandra Turner DOB: [birth year] MRN: [id number]   Date of Service  02/19/2016  HPI/Events of Note  Hgb drop from 7.0 to 6.2.  On heparin gtt  eICU Interventions  Plan: Transfuse 1 unit of pRBC Post-transfusion CBC If Hgb continues to drop consider d/c of heparin     Intervention Category Intermediate Interventions: Bleeding - evaluation and treatment with blood products  Sharlotte Baka 02/19/2016, 6:05 AM

## 2016-02-19 NOTE — Progress Notes (Signed)
Went to room d/t vent alarming.  Pt had hand pulling on ETT.  Increased WOB noted, desat to 88%,  and tachypnea.  RN came to bedside.  Increased PSV, but pt continued to have increased WOB.  RN giving sedation.  RN and Family now at bedside.

## 2016-02-19 NOTE — Progress Notes (Signed)
Pt's Hgb 6.2 after 1 unit PRBC's; pt still oozing blood profusely from PICC insertion site; Dr Molli KnockYacoub and Phillips Climeshuy Dang, RPh notified; order received to transfuse 2 units PRBCs;  Will continue to monitor patient.  Burnard BuntingKatrice Gavynn Duvall, RN

## 2016-02-19 NOTE — Progress Notes (Signed)
Pt oozing blood from PICC line. Dr. Sherene SiresWert notified and new orders carried out.

## 2016-02-20 ENCOUNTER — Inpatient Hospital Stay (HOSPITAL_COMMUNITY): Payer: Medicare Other

## 2016-02-20 LAB — CBC
HEMATOCRIT: 24.3 % — AB (ref 36.0–46.0)
HEMOGLOBIN: 7.4 g/dL — AB (ref 12.0–15.0)
MCH: 25.1 pg — ABNORMAL LOW (ref 26.0–34.0)
MCHC: 30.5 g/dL (ref 30.0–36.0)
MCV: 82.4 fL (ref 78.0–100.0)
Platelets: 115 10*3/uL — ABNORMAL LOW (ref 150–400)
RBC: 2.95 MIL/uL — ABNORMAL LOW (ref 3.87–5.11)
RDW: 20.7 % — ABNORMAL HIGH (ref 11.5–15.5)
WBC: 9.2 10*3/uL (ref 4.0–10.5)

## 2016-02-20 LAB — BASIC METABOLIC PANEL
Anion gap: 11 (ref 5–15)
BUN: 106 mg/dL — AB (ref 6–20)
CALCIUM: 8.2 mg/dL — AB (ref 8.9–10.3)
CO2: 33 mmol/L — AB (ref 22–32)
CREATININE: 2.82 mg/dL — AB (ref 0.44–1.00)
Chloride: 95 mmol/L — ABNORMAL LOW (ref 101–111)
GFR calc Af Amer: 18 mL/min — ABNORMAL LOW (ref >60)
GFR calc non Af Amer: 16 mL/min — ABNORMAL LOW (ref >60)
GLUCOSE: 135 mg/dL — AB (ref 65–99)
Potassium: 4 mmol/L (ref 3.5–5.1)
Sodium: 139 mmol/L (ref 135–145)

## 2016-02-20 LAB — GLUCOSE, CAPILLARY
GLUCOSE-CAPILLARY: 84 mg/dL (ref 65–99)
GLUCOSE-CAPILLARY: 89 mg/dL (ref 65–99)
GLUCOSE-CAPILLARY: 138 mg/dL — AB (ref 65–99)
GLUCOSE-CAPILLARY: 124 mg/dL — AB (ref 65–99)
GLUCOSE-CAPILLARY: 133 mg/dL — AB (ref 65–99)
GLUCOSE-CAPILLARY: 140 mg/dL — AB (ref 65–99)
Glucose-Capillary: 140 mg/dL — ABNORMAL HIGH (ref 65–99)

## 2016-02-20 LAB — HEPARIN LEVEL (UNFRACTIONATED)
HEPARIN UNFRACTIONATED: 0.39 [IU]/mL (ref 0.30–0.70)
Heparin Unfractionated: 0.26 IU/mL — ABNORMAL LOW (ref 0.30–0.70)

## 2016-02-20 LAB — PROCALCITONIN: PROCALCITONIN: 0.72 ng/mL

## 2016-02-20 MED ORDER — HEPARIN (PORCINE) IN NACL 100-0.45 UNIT/ML-% IJ SOLN
1550.0000 [IU]/h | INTRAMUSCULAR | Status: DC
  Filled 2016-02-20 (×2): qty 250

## 2016-02-20 MED ORDER — FUROSEMIDE 10 MG/ML IJ SOLN
10.0000 mg/h | INTRAVENOUS | Status: AC
  Administered 2016-02-20 – 2016-02-21 (×2): 10 mg/h via INTRAVENOUS
  Filled 2016-02-20 (×2): qty 25

## 2016-02-20 MED ORDER — CEFEPIME HCL 1 G IJ SOLR
1.0000 g | INTRAMUSCULAR | Status: AC
  Administered 2016-02-20 – 2016-02-23 (×4): 1 g via INTRAVENOUS
  Filled 2016-02-20 (×4): qty 1

## 2016-02-20 MED ORDER — BISACODYL 10 MG RE SUPP
10.0000 mg | Freq: Once | RECTAL | Status: AC
  Administered 2016-02-20: 10 mg via RECTAL
  Filled 2016-02-20: qty 1

## 2016-02-20 MED ORDER — "THROMBI-PAD 3""X3"" EX PADS"
1.0000 | MEDICATED_PAD | Freq: Once | CUTANEOUS | Status: AC
  Administered 2016-02-20: 1 via TOPICAL
  Filled 2016-02-20 (×3): qty 1

## 2016-02-20 MED ORDER — METOLAZONE 10 MG PO TABS
10.0000 mg | ORAL_TABLET | Freq: Once | ORAL | Status: AC
  Administered 2016-02-20: 10 mg via ORAL
  Filled 2016-02-20: qty 1

## 2016-02-20 NOTE — Progress Notes (Signed)
ANTICOAGULATION CONSULT NOTE - Follow Up Consult  Pharmacy Consult for Heparin Indication: atrial fibrillation  Allergies  Allergen Reactions  . Bee Venom Swelling  . Doxycycline     Hoarseness / throat irritation  . Latex Rash  . Penicillins Rash    Patient Measurements: Height: 5\' 2"  (157.5 cm) Weight: 241 lb 6.5 oz (109.5 kg) IBW/kg (Calculated) : 50.1 Heparin Dosing Weight: 76 kg  Vital Signs: Temp: 98.1 F (36.7 C) (06/19 1518) Temp Source: Oral (06/19 1518) BP: 115/41 mmHg (06/19 1900) Pulse Rate: 80 (06/19 1900)  Labs:  Recent Labs  02/19/16 0530 02/19/16 0823 02/19/16 1600 02/19/16 1634 02/20/16 0340 02/20/16 1000 02/20/16 1100 02/20/16 1825  HGB 6.2*  --   --  6.2* 7.4*  --   --   --   HCT 21.9*  --   --  21.0* 24.3*  --   --   --   PLT 129*  --   --  108* 115*  --   --   --   HEPARINUNFRC 0.45  --   --   --   --   --  0.26* 0.39  CREATININE  --  2.58* 2.62*  --   --  2.82*  --   --     Estimated Creatinine Clearance: 20.7 mL/min (by C-G formula based on Cr of 2.82).   Assessment: 75 year old female with history of atrial fibrillation on Pradaxa PTA (last dose 6/14 at 2110PM) now transitioned to IV heparin. Heparin was held overnight due to bleeding from PICC yesterday PM and drop in Hgb. Heparin was restarted at 0300 AM.   After an increase earlier today, heparin level is therapeutic at 0.39 units/mL. Noted PICC is out of place and needs to be replaced. No bleeding noted.  Goal of Therapy:  Heparin level 0.3-0.7 units/ml Monitor platelets by anticoagulation protocol: Yes   Plan:  Continue heparin at 1550 units/hr.  Daily heparin level and CBC Follow if infusion held for PICC reinsertion  Miah Boye D. Zakaria Fromer, PharmD, BCPS Clinical Pharmacist Pager: [phone] 02/20/2016 7:22 PM

## 2016-02-20 NOTE — Progress Notes (Signed)
Pt's PICC out of place and received order from Dr. Isaiah SergeMannam to d/c PICC line. 3 PIVs currently infusing as access for the night.

## 2016-02-20 NOTE — Progress Notes (Signed)
Pt's arm where PICC line d/c is trickling blood even with pressure dressing and sand bag applied. Dr. Isaiah SergeMannam notified and heparin gtt stopped for 3 hours per order. P. Hoffman NP made aware on unit and new order to be carried out.

## 2016-02-20 NOTE — Progress Notes (Signed)
PULMONARY / CRITICAL CARE MEDICINE   Name: Sandra Turner MRN: [id number] DOB: November 05, 1940    ADMISSION DATE:  March 11, 2016 CONSULTATION DATE:  02/16/2016  REFERRING MD:  Christy Gentles, M.D. / [facility]  CHIEF COMPLAINT:  Shortness of breath  HISTORY OF PRESENT ILLNESS:    This is a 75 year old female well known to the [facility] pulmonary and critical care service who comes to [facility] on 03-11-2016 in the setting of dyspnea. She was admitted that day and was noted to have acute kidney injury and a low hemoglobin value. She was admitted by the hospitalist service and nephrology was consulted. She was administered packed red blood cells and Lasix was given as well. Despite these measures she had ongoing respiratory distress with hypoxemia. She ultimately required intubation on June 15. Pulmonary and critical care medicine in [place] was asked to take the patient on transfer because of no pulmonary and critical care support at [facility].   SUBJECTIVE:  No distress.   VITAL SIGNS: BP 110/46 mmHg  Pulse 77  Temp(Src) 98.8 F (37.1 C) (Oral)  Resp 24  Ht 5\' 2"  (1.575 m)  Wt 109.5 kg (241 lb 6.5 oz)  BMI 44.14 kg/m2  SpO2 99%  HEMODYNAMICS:    VENTILATOR SETTINGS: Vent Mode:  [-] PSV;CPAP FiO2 (%):  [40 %] 40 % Set Rate:  [14 bmp] 14 bmp Vt Set:  [440 mL] 440 mL PEEP:  [8 cmH20] 8 cmH20 Pressure Support:  [5 cmH20] 5 cmH20 Plateau Pressure:  [26 cmH20-29 cmH20] 29 cmH20  INTAKE / OUTPUT:  Intake/Output Summary (Last 24 hours) at 02/20/16 0926 Last data filed at 02/20/16 0700  Gross per 24 hour  Intake 3496.97 ml  Output   1175 ml  Net 2321.97 ml     PHYSICAL EXAMINATION: General:  Sedated. No acute distress opens eyes w/ gentle stim.  Integument:  Warm & dry. No rash on exposed skin. Bruising and weeping bilateral upper extremities. Scaling dermis in lower extremities. Extensive ecchymosis  HEENT: Endotracheal tube in place, supple, no  JVD Cardiovascular:  irregular rhythm regular rate, Anasarca Pulmonary:  Coarse breath sounds on auscultation bilaterally. Symmetric chest wall rise on ventilator Abdomen: Soft. Normal bowel sounds x 4. Protuberant  Musculoskeletal:  Normal bulk. No joint deformity or effusion appreciated Neurological:  Sedated. Withdraws to pain in extremities. PERRL   LABS:  BMET  Recent Labs Lab 02/19/16 0001 02/19/16 0823 02/19/16 1600  NA 136 137 134*  K 4.6 5.0 4.5  CL 94* 95* 94*  CO2 34* 34* 30  BUN 91* 95* 94*  CREATININE 2.67* 2.58* 2.62*  GLUCOSE 139* 128* 231*    Electrolytes  Recent Labs Lab 02/15/16 0420 02/16/16 0213 02/17/16 0120  02/19/16 0001 02/19/16 0823 02/19/16 1600  CALCIUM 8.6* 8.7* 8.7*  < > 8.0* 8.0* 7.4*  MG  --   --  2.4  --   --   --   --   PHOS 4.0 4.7* 3.8  --   --   --   --   < > = values in this interval not displayed.  CBC  Recent Labs Lab 02/19/16 0530 02/19/16 1634 02/20/16 0340  WBC 8.4 8.2 9.2  HGB 6.2* 6.2* 7.4*  HCT 21.9* 21.0* 24.3*  PLT 129* 108* 115*    Coag's  Recent Labs Lab 02/17/16 0120  APTT 82*  INR 3.21*    Sepsis Markers  Recent Labs Lab 02/16/16 0213 02/18/16 0416 02/20/16 0340  PROCALCITON 0.15 0.38  0.72    ABG  Recent Labs Lab 02/16/16 1500 02/16/16 2008 02/18/16 0300  PHART 7.529* 7.554* 7.484*  PCO2ART 48.3* 46.4* 52.1*  PO2ART 72.3* 50.2* 60.4*    Liver Enzymes  Recent Labs Lab 02/15/16 0420 02/16/16 0213 02/17/16 0120  AST  --   --  26  ALT  --   --  14  ALKPHOS  --   --  78  BILITOT  --   --  2.3*  ALBUMIN 3.4* 3.4* 2.6*    Cardiac Enzymes  Recent Labs Lab 02/17/16 0120 02/17/16 0625 02/17/16 1300  TROPONINI 0.03 0.03 0.04*    Glucose  Recent Labs Lab 02/19/16 0739 02/19/16 1142 02/19/16 1605 02/19/16 2103 02/19/16 2324 02/20/16 0358  GLUCAP 137* 164* 141* 146* 138* 124*    Imaging Dg Chest Port 1 View  02/20/2016  CLINICAL DATA:  Pulmonary edema. EXAM:  PORTABLE CHEST 1 VIEW COMPARISON:  02/18/2016. FINDINGS: Endotracheal tube 13 mm above the carina retraction of approximately 2 cm should be considered. Hemidiaphragms are incompletely visualized. NG tube extends below the lower border of the image. Right PICC line stable position. Prior cardiac valve replacement. Left atrial appendage clip noted. Persistent cardiomegaly. Diffuse bilateral pulmonary infiltrates and bilateral pleural effusions again noted. Findings consist with bilateral pulmonary edema and/or pneumonia. No pneumothorax. Severely degenerative changes both shoulders. Probable loose body noted projected over the right shoulder . IMPRESSION: 1. Endotracheal tube tip 13 mm above the carina. Retraction of approximately 2 cm should be considered. Hemidiaphragms are incompletely visualized. NG tube extends below the lower border of the image. Right PICC line stable position. 2. Persistent unchanged bilateral dense pulmonary infiltrates. Consistent with pulmonary edema and/or pneumonia . Persistent bilateral pleural effusions. 3.  Prior cardiac valve replacement.  Persistent cardiomegaly . Electronically Signed   By: Maisie Fushomas  Register   On: 02/20/2016 06:57  ETT good position. Aeration a little improved. Basilar R>L effusion    STUDIES:  Echo (03/16/15): LV EF: 55%-60% TTE (03/16/15): LV EF 55-60, mildly calcified mitral valve, bioprosthetic aortic valve functioning well but poorly visualized, right ventricle moderately dilated, RVSP estimate 44 mmHg, right atrium severely dilated, mild to moderate tricuspid regurgitation PFT (04/15/15): FVC 0.96 L (36%) FEV1 0.74 L (36%) FEV1/FVC 0.76 no bronchodilator response TLC 2.90 L (61%) DLCO uncorrected 40% CXR PA/LAT 6/11: Silhouetting of bilateral hemidiaphragms with lower lobe opacification suggestive of pleural effusions. PORT CXR 6/15: Patchy bilateral alveolar opacities now involving the upper lung zones. Improved visualization of hemidiaphragms  bilaterally. Endotracheal tube & orogastric tube in good position. Right PICC line with tip in SVC. Blunting of right costocardiac angle suggestive of pleural effusion. CT Abdomen Pelvis 6/16: Moderate right and small left pleural effusions, with patchy bibasilar airspace opacities, which may reflect pneumonia or pulmonary edema. Small amount of ascites within the abdomen and pelvis, some of which is high in attenuation, of uncertain significance. Vague nonspecific soft tissue inflammation about the pancreas may simply reflect underlying edema. Would correlate with the patient's symptoms. Diffuse soft tissue edema tracking about the abdomen and pelvis, concerning for anasarca. Scattered diverticulosis along the sigmoid colon without evidence of diverticulitis. Mild cardiomegaly. Diffuse coronary artery calcifications seen.  MICROBIOLOGY: MRSA PCR 6/11:  Positive Blood Ctx x2 6/15 >> Tracheal Asp Ctx 6/15 >> Urine Strep Ag 6/15:  Negative   ANTIBIOTICS: Aztreonam 6/15 >>6/19 Vancomycin 6/15 >>6/22 Cefepime 6/19>>>6/22  SIGNIFICANT EVENTS: 6/11 - Admission to [facility] 6/11 - Transfused 2u PRBC 6/15 - Transfused 3u PRBC 6/15 - Intubation  for respiratory failure and transferred to [facility] 6/17 started on lasix gtt.  6/18 weaning, CXR a little better. Had family meeting. Goals of care discussed. Made DNR.   LINES/TUBES: OETT 7.5 6/15 >> OGT 6/15 >> FOLEY 6/11>> R TL PICC 6/15 >> PIV x2  DISCUSSION: This is a 75 year old female with a past medical history significant for aortic valve replacement in 2016 complicated by recurrent episodes of respiratory failure with hypoxemia due to mucus plugging and underlying severe asthma who returned to the hospital on 02/16/2016 in the setting of acute kidney injury, anemia, and acute respiratory failure with hypoxemia. She has a recent history of recurrent episodes of diastolic heart failure requiring multiple hospitalizations.  Working dx at  this point is decompensated diastolic HF c/b ALI which was either the result of TRALI or HCAP (NOS). She has been on abx, we added lasix gtt and she is making some improvement both clinically and radiographically. At this point the plan will be to cont IV abx, cont lasix gtt and re-assess for extubation daily. Have discussed w/ family and made her DNR. Would favor one-way extubation and will need to verify this prior to extubation. In the best case scenario looking at hospice after.   ASSESSMENT / PLAN:  PULMONARY A: Acute Respiratory Failure with Hypoxia - Volume overload vs TRALI vs HCAP. Possible ARDS, clearly ARDS physiology Possible HCAP H/O Asthma H/O Tracheomalacia  P:   Full Vent Support w/ PEEP for recruitment, ARDS protocol Daily SBT & WUA ABG & Port CXR  Xopenex & Atrovent Nebs q6hr Lasix as BP/BUN/creatinine allow  Hope for extubation soon-->would favor no re-intubation   CARDIOVASCULAR A:  Chronic Diastolic CHF H/O Atrial fibrillation - S/P MAZE 2016 H/O Aortic Stenosis - S/P bioprosthetic aortic valve.  P:  Telemetry monitoring  Vitals per unit protocol Holding Pradaxa for anemia, will restart when able given AVR; Heparin gtt for now per pharmacy protocol Continue Diltizem 30 mg VT q6hr Cont lasix /hr Transduce CVP  RENAL A:   Acute on Chronic Renal Failure - Improving with diuresis (baseline creatinine appears to be in to 2 to 2.3 range) Borderline hyperkalemia  Metabolic Akalosis  P:   Trending UOP with Foley Monitoring electrolytes & renal function daily Replace electrolytes as needed Lasix 10 mg/hr drip.  GASTROINTESTINAL A:   Severe protein calorie malnutrition   P:   Pepcid IV q12hr Cont tube feeds  HEMATOLOGIC A:   Chronic Anticoagulation - Pradaxa for A fib. Anemia - No obvious bleeding source. S/P 2u PRBC 6/11 & 3u PRBC 6/15. hgb drifting. Got 1 unit PRBC 6/18  P:  Trending cell counts daily w/ CBC SCDs Transfuse for Hgb  <7.0  INFECTIOUS A:   HCAP  P:   Empiric Vancomycin & Aztreonam Day #5, transition to vanc/cefepime (took it last year without difficulty) and have stop date at 6/22. Awaiting blood & tracheal aspirate cultures May require bronchoscopy for sampling  ENDOCRINE A:   Hyperglycemia - Mild.    P:   Monitor glucose on daily labs.  NEUROLOGIC A:   Sedation on Ventilator.  P:   RASS goal:  0 to -1 Dc'd gtts. Change to PRN   FAMILY  - Updates: No family at bedside to update.  Brother coming in tomorrow to discuss plan of care.  - Inter-disciplinary family meet or Palliative Care meeting due by:  6/22  The patient is critically ill with multiple organ systems failure and requires high complexity decision making for  assessment and support, frequent evaluation and titration of therapies, application of advanced monitoring technologies and extensive interpretation of multiple databases.   Critical Care Time devoted to patient care services described in this note is 37 Minutes. This time reflects time of care of this signee Dr Koren Bound. This critical care time does not reflect procedure time, or teaching time or supervisory time of PA/NP/Med student/Med Resident etc but could involve care discussion time.   Alyson Reedy, M.D. [facility] Pulmonary/Critical Care Medicine. Pager: [phone]. After hours pager: [phone].

## 2016-02-20 NOTE — Progress Notes (Signed)
Pt placed back on full support at this time due to inc WOB, dec VT <200, pt tolerating full support well, RT will monitor and try wean again later as tolerated

## 2016-02-20 NOTE — Progress Notes (Signed)
ANTICOAGULATION CONSULT NOTE - Follow Up Consult  Pharmacy Consult for Heparin Indication: atrial fibrillation  Allergies  Allergen Reactions  . Bee Venom Swelling  . Doxycycline     Hoarseness / throat irritation  . Latex Rash  . Penicillins Rash    Patient Measurements: Height: 5\' 2"  (157.5 cm) Weight: 241 lb 6.5 oz (109.5 kg) IBW/kg (Calculated) : 50.1 Heparin Dosing Weight: 76 kg  Vital Signs: Temp: 98.8 F (37.1 C) (06/19 0821) Temp Source: Oral (06/19 0821) BP: 115/57 mmHg (06/19 1100) Pulse Rate: 78 (06/19 1100)  Labs:  Recent Labs  02/17/16 1300  02/18/16 2015 02/19/16 0001 02/19/16 0530 02/19/16 0823 02/19/16 1600 02/19/16 1634 02/20/16 0340 02/20/16 1100  HGB  --   < >  --   --  6.2*  --   --  6.2* 7.4*  --   HCT  --   < >  --   --  21.9*  --   --  21.0* 24.3*  --   PLT  --   < >  --   --  129*  --   --  108* 115*  --   HEPARINUNFRC  --   < > 0.47  --  0.45  --   --   --   --  0.26*  CREATININE  --   < >  --  2.67*  --  2.58* 2.62*  --   --   --   TROPONINI 0.04*  --   --   --   --   --   --   --   --   --   < > = values in this interval not displayed.  Estimated Creatinine Clearance: 22.3 mL/min (by C-G formula based on Cr of 2.62).   Medications:  Infusions:  . furosemide (LASIX) infusion    . heparin 1,450 Units/hr (02/20/16 0343)    Assessment: 75 year old female with history of atrial fibrillation on Pradaxa PTA (last dose 6/14 at 2110PM) now transitioned to IV heparin. Heparin was held overnight due to bleeding from PICC yesterday PM and drop in Hgb. Hgb is improved s/p 2 units and oozing has decreased. Heparin was restarted at 0300 AM.   Heparin level after restart is 0.26 - slightly below goal.   Goal of Therapy:  Heparin level 0.3-0.7 units/ml Monitor platelets by anticoagulation protocol: Yes   Plan:  Increase heparin to 1550 units/hr.  Recheck in 8 hours.  Daily heparin level and CBC while on therapy.   Link SnufferJessica Sufyan Meidinger,  PharmD, BCPS Clinical Pharmacist [phone] 02/20/2016,11:35 AM

## 2016-02-20 NOTE — Progress Notes (Signed)
Pt's PICC line out of insertion site; stat-lock not attached to skin  Due to moisture from weeping; VATS notified and PXCR obtained; Pt's PICC line not in place per chest xray; Dr Isaiah SergeMannam notified; order received to have PICC line replaced; VATS team notified; Will continue to monitor.  Burnard BuntingKatrice Amory Zbikowski, RN

## 2016-02-21 ENCOUNTER — Inpatient Hospital Stay (HOSPITAL_COMMUNITY): Payer: Medicare Other

## 2016-02-21 LAB — BLOOD GAS, ARTERIAL
ACID-BASE EXCESS: 11.3 mmol/L — AB (ref 0.0–2.0)
BICARBONATE: 35.7 meq/L — AB (ref 20.0–24.0)
Drawn by: 449561
FIO2: 0.4
LHR: 14 {breaths}/min
O2 Saturation: 98.8 %
PEEP/CPAP: 8 cmH2O
Patient temperature: 98.6
TCO2: 37.2 mmol/L (ref 0–100)
VT: 440 mL
pCO2 arterial: 49.8 mmHg — ABNORMAL HIGH (ref 35.0–45.0)
pH, Arterial: 7.469 — ABNORMAL HIGH (ref 7.350–7.450)
pO2, Arterial: 90.5 mmHg (ref 80.0–100.0)

## 2016-02-21 LAB — BASIC METABOLIC PANEL
Anion gap: 8 (ref 5–15)
BUN: 117 mg/dL — AB (ref 6–20)
CHLORIDE: 96 mmol/L — AB (ref 101–111)
CO2: 34 mmol/L — AB (ref 22–32)
CREATININE: 2.7 mg/dL — AB (ref 0.44–1.00)
Calcium: 7.9 mg/dL — ABNORMAL LOW (ref 8.9–10.3)
GFR calc non Af Amer: 16 mL/min — ABNORMAL LOW (ref >60)
GFR, EST AFRICAN AMERICAN: 19 mL/min — AB (ref >60)
Glucose, Bld: 119 mg/dL — ABNORMAL HIGH (ref 65–99)
POTASSIUM: 3.6 mmol/L (ref 3.5–5.1)
SODIUM: 138 mmol/L (ref 135–145)

## 2016-02-21 LAB — CULTURE, BLOOD (ROUTINE X 2)
Culture: NO GROWTH
Culture: NO GROWTH

## 2016-02-21 LAB — PREPARE RBC (CROSSMATCH)

## 2016-02-21 LAB — GLUCOSE, CAPILLARY
GLUCOSE-CAPILLARY: 133 mg/dL — AB (ref 65–99)
GLUCOSE-CAPILLARY: 135 mg/dL — AB (ref 65–99)
Glucose-Capillary: 127 mg/dL — ABNORMAL HIGH (ref 65–99)
Glucose-Capillary: 129 mg/dL — ABNORMAL HIGH (ref 65–99)
Glucose-Capillary: 141 mg/dL — ABNORMAL HIGH (ref 65–99)
Glucose-Capillary: 141 mg/dL — ABNORMAL HIGH (ref 65–99)
Glucose-Capillary: 151 mg/dL — ABNORMAL HIGH (ref 65–99)

## 2016-02-21 LAB — PHOSPHORUS: PHOSPHORUS: 4.5 mg/dL (ref 2.5–4.6)

## 2016-02-21 LAB — CBC
HCT: 20.5 % — ABNORMAL LOW (ref 36.0–46.0)
HEMATOCRIT: 21.2 % — AB (ref 36.0–46.0)
HEMOGLOBIN: 6.3 g/dL — AB (ref 12.0–15.0)
Hemoglobin: 6.3 g/dL — CL (ref 12.0–15.0)
MCH: 25.1 pg — AB (ref 26.0–34.0)
MCH: 26.3 pg (ref 26.0–34.0)
MCHC: 29.7 g/dL — AB (ref 30.0–36.0)
MCHC: 30.7 g/dL (ref 30.0–36.0)
MCV: 84.5 fL (ref 78.0–100.0)
MCV: 85.4 fL (ref 78.0–100.0)
PLATELETS: 112 10*3/uL — AB (ref 150–400)
Platelets: 125 10*3/uL — ABNORMAL LOW (ref 150–400)
RBC: 2.51 MIL/uL — AB (ref 3.87–5.11)
RBC: 2.4 MIL/uL — AB (ref 3.87–5.11)
RDW: 21.6 % — ABNORMAL HIGH (ref 11.5–15.5)
RDW: 20.8 % — AB (ref 11.5–15.5)
WBC: 8.7 10*3/uL (ref 4.0–10.5)
WBC: 9.1 10*3/uL (ref 4.0–10.5)

## 2016-02-21 LAB — HEMOGLOBIN AND HEMATOCRIT, BLOOD
HCT: 25.6 % — ABNORMAL LOW (ref 36.0–46.0)
HEMOGLOBIN: 7.8 g/dL — AB (ref 12.0–15.0)

## 2016-02-21 LAB — MAGNESIUM: MAGNESIUM: 2.3 mg/dL (ref 1.7–2.4)

## 2016-02-21 LAB — HEPARIN LEVEL (UNFRACTIONATED): Heparin Unfractionated: 0.34 IU/mL (ref 0.30–0.70)

## 2016-02-21 MED ORDER — SODIUM CHLORIDE 0.9 % IV SOLN
Freq: Once | INTRAVENOUS | Status: AC
  Administered 2016-02-21: 04:00:00 via INTRAVENOUS

## 2016-02-21 MED ORDER — DEXTROSE 5 % IV SOLN
8.0000 mg/h | INTRAVENOUS | Status: AC
  Administered 2016-02-22: 8 mg/h via INTRAVENOUS
  Filled 2016-02-21 (×2): qty 25

## 2016-02-21 MED ORDER — DILTIAZEM 12 MG/ML ORAL SUSPENSION
30.0000 mg | Freq: Three times a day (TID) | ORAL | Status: DC
  Administered 2016-02-21 – 2016-02-26 (×14): 30 mg
  Filled 2016-02-21 (×19): qty 3

## 2016-02-21 MED ORDER — SODIUM CHLORIDE 0.9 % IV SOLN
Freq: Once | INTRAVENOUS | Status: AC
  Administered 2016-02-21: 14:00:00 via INTRAVENOUS

## 2016-02-21 MED ORDER — POTASSIUM CHLORIDE 20 MEQ/15ML (10%) PO SOLN
40.0000 meq | Freq: Once | ORAL | Status: AC
Start: 2016-02-21 — End: 2016-02-21
  Administered 2016-02-21: 40 meq
  Filled 2016-02-21: qty 30

## 2016-02-21 MED ORDER — FERROUS SULFATE 220 (44 FE) MG/5ML PO ELIX
220.0000 mg | ORAL_SOLUTION | Freq: Three times a day (TID) | ORAL | Status: DC
  Administered 2016-02-21 – 2016-02-22 (×4): 220 mg via ORAL
  Filled 2016-02-21 (×5): qty 5

## 2016-02-21 NOTE — H&P (Addendum)
Receiving RN updated to place H&H after blood tx, family meeting with Dr. Braxton FeathersYaccoub at 2pm

## 2016-02-21 NOTE — Progress Notes (Signed)
eLink Physician-Brief Progress Note Patient Name: Sandra Turner DOB: Feb 28, 1941 MRN: [id number]   Date of Service  02/21/2016  HPI/Events of Note  Hb down to 6.3.  eICU Interventions  Will give 1 unit PRBC.     Intervention Category Major Interventions: Other:  Lashaun Poch 02/21/2016, 3:02 AM

## 2016-02-21 NOTE — Progress Notes (Signed)
CRITICAL VALUE ALERT  Critical value received:  hgb 6.3  Date of notification:  02/21/2016  Time of notification:  0300  Critical value read back:Yes.    Nurse who received alert:  Lillia CorporalHancock, Jasia Hiltunen Elizabeth  MD notified (1st page):  Dr. Craige CottaSood  Time of first page:  0300  MD notified (2nd page):  Time of second page:  Responding MD:  Dr. Craige CottaSood  Time MD responded:  0300

## 2016-02-21 NOTE — Progress Notes (Signed)
PULMONARY / CRITICAL CARE MEDICINE   Name: Sandra Turner MRN: [id number] DOB: Apr 12, 1941    ADMISSION DATE:  02/26/2016 CONSULTATION DATE:  02/16/2016  REFERRING MD:  Christy Gentles, M.D. / [facility]  CHIEF COMPLAINT:  Shortness of breath  HISTORY OF PRESENT ILLNESS:  75 year old female well known to the [facility] pulmonary and critical care service who comes to [facility] on 02/29/2016 in the setting of dyspnea. She was admitted that day and was noted to have acute kidney injury and a low hemoglobin value. She was admitted by the hospitalist service and nephrology was consulted. She was administered packed red blood cells and Lasix was given as well. Despite these measures she had ongoing respiratory distress with hypoxemia. She ultimately required intubation on June 15. Pulmonary and critical care medicine in [place] was asked to take the patient on transfer because of no pulmonary and critical care support at [facility].   SUBJECTIVE: PICC line fell out last night 2/2 edema/"sloughing" per RN; afterwards it bleed. Heparin stopped for 3 hrs, bleeding stopped after thrombi pad. Drop in Hb from 7.4 to 6.3. One unit of PRBC infused. Awaiting repeat labs. 3 PIV remain in left shoulder. Soft MAP (taken on RLE). PRN sedation. Continued Lasix gtt/ +1500 ml.   VITAL SIGNS: BP 97/47 mmHg  Pulse 83  Temp(Src) 98.9 F (37.2 C) (Oral)  Resp 16  Ht 5\' 2"  (1.575 m)  Wt 238 lb 5.1 oz (108.1 kg)  BMI 43.58 kg/m2  SpO2 100%  HEMODYNAMICS:    VENTILATOR SETTINGS: Vent Mode:  [-] PRVC FiO2 (%):  [40 %] 40 % Set Rate:  [14 bmp] 14 bmp Vt Set:  [440 mL] 440 mL PEEP:  [8 cmH20] 8 cmH20 Plateau Pressure:  [20 cmH20-26 cmH20] 24 cmH20  INTAKE / OUTPUT:  Intake/Output Summary (Last 24 hours) at 02/21/16 1318 Last data filed at 02/21/16 1200  Gross per 24 hour  Intake 2214.5 ml  Output   2550 ml  Net -335.5 ml     PHYSICAL EXAMINATION: General: No acute distress opens  eyes w/ gentle stim.  Integument: Warm & dry. Pressure dsg/ace wrap to right upper arm, with extensive ecchymosis to lower arm. Weeping to bilateral upper extremities. Scaling dermis in lower extremities.  HEENT: Endotracheal tube in place, supple, no JVD Cardiovascular: irregular rhythm regular rate, Anasarca, +mumur Pulmonary: Wheezing throughout, diminished bibasilarly. Symmetric chest wall rise on ventilator Abdomen: Soft. Normal bowel sounds x 4. Protuberant/obese Musculoskeletal: Normal bulk. No joint deformity or effusion appreciated Neurological: PERRL, follows commands, generalized weakness, MAE  LABS:  BMET  Recent Labs Lab 02/19/16 1600 02/20/16 1000 02/21/16 0209  NA 134* 139 138  K 4.5 4.0 3.6  CL 94* 95* 96*  CO2 30 33* 34*  BUN 94* 106* 117*  CREATININE 2.62* 2.82* 2.70*  GLUCOSE 231* 135* 119*    Electrolytes  Recent Labs Lab 02/16/16 0213 02/17/16 0120  02/19/16 1600 02/20/16 1000 02/21/16 0209  CALCIUM 8.7* 8.7*  < > 7.4* 8.2* 7.9*  MG  --  2.4  --   --   --  2.3  PHOS 4.7* 3.8  --   --   --  4.5  < > = values in this interval not displayed.  CBC  Recent Labs Lab 02/20/16 0340 02/21/16 0209 02/21/16 0838  WBC 9.2 8.7 9.1  HGB 7.4* 6.3* 6.3*  HCT 24.3* 21.2* 20.5*  PLT 115* 125* 112*    Coag's  Recent Labs Lab 02/17/16 0120  APTT 82*  INR 3.21*    Sepsis Markers  Recent Labs Lab 02/16/16 0213 02/18/16 0416 02/20/16 0340  PROCALCITON 0.15 0.38 0.72    ABG  Recent Labs Lab 02/16/16 2008 02/18/16 0300 02/21/16 0400  PHART 7.554* 7.484* 7.469*  PCO2ART 46.4* 52.1* 49.8*  PO2ART 50.2* 60.4* 90.5    Liver Enzymes  Recent Labs Lab 02/15/16 0420 02/16/16 0213 02/17/16 0120  AST  --   --  26  ALT  --   --  14  ALKPHOS  --   --  78  BILITOT  --   --  2.3*  ALBUMIN 3.4* 3.4* 2.6*    Cardiac Enzymes  Recent Labs Lab 02/17/16 0120 02/17/16 0625 02/17/16 1300  TROPONINI 0.03 0.03 0.04*     Glucose  Recent Labs Lab 02/20/16 1517 02/20/16 1940 02/20/16 2358 02/21/16 0329 02/21/16 0833 02/21/16 1133  GLUCAP 140* 140* 151* 127* 133* 135*    Imaging Dg Chest Port 1 View  02/21/2016  CLINICAL DATA:  Shortness of breath. EXAM: PORTABLE CHEST 1 VIEW COMPARISON:  02/20/2016. FINDINGS: Endotracheal tube, NG tube in stable position. Interim removal of PICC line. Prior cardiac valve replacement. Left atrial appendage clip noted. Persistent cardiomegaly with bilateral pulmonary infiltrates pleural effusions consistent persistent congestive heart failure. No significant interim change. IMPRESSION: 1. Interim removal of right PICC line. Endotracheal tube and NG tube in stable position. 2. Prior cardiac valve replacement. Persistent cardiomegaly with bilateral pulmonary edema and bilateral pleural effusions. No interim change. Electronically Signed   By: Maisie Fushomas  Register   On: 02/21/2016 06:43   Dg Chest Port 1 View  02/20/2016  CLINICAL DATA:  75 year old female right side PICC line placement. Initial encounter. EXAM: PORTABLE CHEST 1 VIEW COMPARISON:  0449 hours. FINDINGS: Portable AP semi upright view at 1707 hours. Right side PICC line tip now projects over the proximal third right clavicle shaft. Stable endotracheal tube and visible enteric tube. Some regression of veiling opacity at both lung bases. Residual dense retrocardiac opacity and obscuration of the diaphragm. No pneumothorax. Pulmonary vascularity appears increased since 02/18/2016. Stable cardiac size and mediastinal contours. IMPRESSION: 1. Right PICC line catheter tip has been pulled back and now projects at the right subclavian vein level. 2.  Otherwise stable lines and tubes. 3. Continued bilateral pleural effusions with bibasilar atelectasis or consolidation, but improved basilar ventilation since this morning. 4. Pulmonary vascular congestion appears increased since 02/18/2016 compatible with mild or developing  interstitial edema. Electronically Signed   By: Odessa FlemingH  Hall M.D.   On: 02/20/2016 17:21  ETT good position. Aeration a little improved. Basilar R>L effusion    STUDIES:  Echo (03/16/15): LV EF: 55%-60% TTE (03/16/15): LV EF 55-60, mildly calcified mitral valve, bioprosthetic aortic valve functioning well but poorly visualized, right ventricle moderately dilated, RVSP estimate 44 mmHg, right atrium severely dilated, mild to moderate tricuspid regurgitation PFT (04/15/15): FVC 0.96 L (36%) FEV1 0.74 L (36%) FEV1/FVC 0.76 no bronchodilator response TLC 2.90 L (61%) DLCO uncorrected 40% CXR PA/LAT 6/11: Silhouetting of bilateral hemidiaphragms with lower lobe opacification suggestive of pleural effusions. PORT CXR 6/15: Patchy bilateral alveolar opacities now involving the upper lung zones. Improved visualization of hemidiaphragms bilaterally. Endotracheal tube & orogastric tube in good position. Right PICC line with tip in SVC. Blunting of right costocardiac angle suggestive of pleural effusion. CT Abdomen Pelvis 6/16: Moderate right and small left pleural effusions, with patchy bibasilar airspace opacities, which may reflect pneumonia or pulmonary edema. Small amount of ascites within  the abdomen and pelvis, some of which is high in attenuation, of uncertain significance. Vague nonspecific soft tissue inflammation about the pancreas may simply reflect underlying edema. Would correlate with the patient's symptoms. Diffuse soft tissue edema tracking about the abdomen and pelvis, concerning for anasarca. Scattered diverticulosis along the sigmoid colon without evidence of diverticulitis. Mild cardiomegaly. Diffuse coronary artery calcifications seen.  MICROBIOLOGY: MRSA PCR 6/11:  Positive Blood Ctx x2 6/15 >> Tracheal Asp Ctx 6/15 >> negative Urine Strep Ag 6/15:  Negative   ANTIBIOTICS: Aztreonam 6/15 >> 6/19 Vancomycin 6/15 >>  Cefepime 6/19>>   SIGNIFICANT EVENTS: 6/11 - Admission to [facility] 6/11 -  Transfused 2u PRBC 6/15 - Transfused 3u PRBC 6/15 - Intubation for respiratory failure and transferred to [facility] 6/17 - started on lasix gtt.  6/18 - weaning, CXR a little better. Had family meeting. Goals of care discussed. Made DNR.   LINES/TUBES: OETT 7.5 6/15 >> OGT 6/15 >> FOLEY 6/11>> R TL PICC 6/15 >> PIV x2  DISCUSSION:  75 year old female with a past medical history significant for aortic valve replacement in 2016 complicated by recurrent episodes of respiratory failure with hypoxemia due to mucus plugging and underlying severe asthma who returned to the hospital on 02/16/2016 in the setting of acute kidney injury, anemia, and acute respiratory failure with hypoxemia. She has a recent history of recurrent episodes of diastolic heart failure requiring multiple hospitalizations.  Working dx at this point is decompensated diastolic HF c/b ALI which was either the result of TRALI or HCAP (NOS). She has been on abx, we added lasix gtt and she is making some improvement both clinically and radiographically. At this point the plan will be to cont IV abx, cont lasix gtt and re-assess for extubation daily. Have discussed w/ family and made her DNR. Would favor one-way extubation and will need to verify this prior to extubation. In the best case scenario looking at hospice after.   ASSESSMENT / PLAN:  PULMONARY A: Acute Respiratory Failure with Hypoxia - Volume overload vs TRALI vs HCAP. Possible ARDS, clearly ARDS physiology Possible HCAP H/O Asthma H/O Tracheomalacia  P:   Full Vent Support w/ PEEP for recruitment, ARDS protocol Reduce PEEP to 5  Daily SBT & WUA Trend CXR Xopenex & Atrovent Nebs q6hr Lasix as BP/BUN/creatinine allow  Hope for extubation soon-->would favor no re-intubation   CARDIOVASCULAR A:  Chronic Diastolic CHF H/O Atrial fibrillation - S/P MAZE 2016 H/O Aortic Stenosis - S/P bioprosthetic aortic valve.  P:  Telemetry monitoring  Vitals per  unit protocol Hold Pradaxa for anemia, will restart when able given AVR Heparin gtt for now per pharmacy protocol >> hold due to bleeding 6/20 Decrease Diltizem to 30 mg VT q8hr Cont lasix 10mg /hr  RENAL A:   Acute on Chronic Renal Failure - Improving with diuresis (baseline creatinine appears to be in to 2 to 2.3 range) Borderline hyperkalemia  Metabolic Akalosis  P:   Trend UOP   Monitoring electrolytes & renal function daily Replace electrolytes as needed Lasix 10 mg/hr drip. KCL 40 mEq x1   GASTROINTESTINAL A:   Severe protein calorie malnutrition   P:   Pepcid IV q12hr Cont tube feeds  HEMATOLOGIC A:   Chronic Anticoagulation - Pradaxa for A fib. Anemia - No obvious bleeding source. S/P 2u PRBC 6/11 & 3u PRBC 6/15.  hgb drifting. Got 1 unit PRBC 6/18 Thrombocytopenia  P:  Trending cell counts daily w/ CBC SCDs Transfuse for Hgb <7.0, tx  1 unit PRBC's 6/20  Ferrous sulfate PT TID  INFECTIOUS A:   HCAP  P:   Empiric antibiotics, transition to vanc/cefepime (took it last year without difficulty) and have stop date at 6/22. Awaiting blood & tracheal aspirate cultures May require bronchoscopy for sampling  ENDOCRINE A:   Hyperglycemia - Mild.    P:   Monitor glucose on daily labs.  NEUROLOGIC A:   Sedation on Ventilator.  P:   RASS goal:  0 to -1 PRN sedation only   FAMILY:  - Updates:  No family available at bedside.      - Inter-disciplinary family meet or Palliative Care meeting due by:  6/22   Canary Brim, NP-C [facility] Pgr: [phone] or if no answer [phone] 02/21/2016, 1:18 PM  Attending Note:  75 year old female with a complicated PMH presenting with HCAP vs pulmonary edema induced respiratory failure.  Patient has been rather slow to wean.  On exam, diffuse crackles.  CXR that I reviewed myself persistent pulmonary edema.  Brother is to arrive today for conversation regarding plan of care.  Not yet time to  start considering trach.  Renal function improving and UOP improving, will continue with lasix drip and add zaroxolyn.  If unable to meet with brother today then will plan on an appointment tomorrow morning.  Transfuse one unit of blood in the meantime and hold heparin for 6 hours then restart.  The patient is critically ill with multiple organ systems failure and requires high complexity decision making for assessment and support, frequent evaluation and titration of therapies, application of advanced monitoring technologies and extensive interpretation of multiple databases.   Critical Care Time devoted to patient care services described in this note is  35  Minutes. This time reflects time of care of this signee Dr Koren Bound. This critical care time does not reflect procedure time, or teaching time or supervisory time of PA/NP/Med student/Med Resident etc but could involve care discussion time.  Alyson Reedy, M.D. [facility]monary/Critical Care Medicine. Pager: [phone]. After hours pager: [phone].

## 2016-02-22 ENCOUNTER — Inpatient Hospital Stay (HOSPITAL_COMMUNITY): Payer: Medicare Other

## 2016-02-22 DIAGNOSIS — G9340 Encephalopathy, unspecified: Secondary | ICD-10-CM

## 2016-02-22 LAB — TYPE AND SCREEN
ABO/RH(D): A NEG
ANTIBODY SCREEN: NEGATIVE
UNIT DIVISION: 0
UNIT DIVISION: 0
Unit division: 0
Unit division: 0
Unit division: 0

## 2016-02-22 LAB — CBC
HEMATOCRIT: 25.6 % — AB (ref 36.0–46.0)
HEMATOCRIT: 24.9 % — AB (ref 36.0–46.0)
HEMOGLOBIN: 7.8 g/dL — AB (ref 12.0–15.0)
Hemoglobin: 7.5 g/dL — ABNORMAL LOW (ref 12.0–15.0)
MCH: 26 pg (ref 26.0–34.0)
MCH: 26.2 pg (ref 26.0–34.0)
MCHC: 30.5 g/dL (ref 30.0–36.0)
MCHC: 30.1 g/dL (ref 30.0–36.0)
MCV: 85.3 fL (ref 78.0–100.0)
MCV: 87.1 fL (ref 78.0–100.0)
PLATELETS: 133 10*3/uL — AB (ref 150–400)
Platelets: 132 10*3/uL — ABNORMAL LOW (ref 150–400)
RBC: 3 MIL/uL — AB (ref 3.87–5.11)
RBC: 2.86 MIL/uL — AB (ref 3.87–5.11)
RDW: 20.5 % — ABNORMAL HIGH (ref 11.5–15.5)
RDW: 21.6 % — ABNORMAL HIGH (ref 11.5–15.5)
WBC: 9 10*3/uL (ref 4.0–10.5)
WBC: 8.2 10*3/uL (ref 4.0–10.5)

## 2016-02-22 LAB — GLUCOSE, CAPILLARY
GLUCOSE-CAPILLARY: 122 mg/dL — AB (ref 65–99)
GLUCOSE-CAPILLARY: 136 mg/dL — AB (ref 65–99)
Glucose-Capillary: 119 mg/dL — ABNORMAL HIGH (ref 65–99)
Glucose-Capillary: 122 mg/dL — ABNORMAL HIGH (ref 65–99)
Glucose-Capillary: 120 mg/dL — ABNORMAL HIGH (ref 65–99)
Glucose-Capillary: 127 mg/dL — ABNORMAL HIGH (ref 65–99)

## 2016-02-22 LAB — BASIC METABOLIC PANEL
Anion gap: 12 (ref 5–15)
BUN: 119 mg/dL — ABNORMAL HIGH (ref 6–20)
CHLORIDE: 95 mmol/L — AB (ref 101–111)
CO2: 33 mmol/L — ABNORMAL HIGH (ref 22–32)
Calcium: 8.6 mg/dL — ABNORMAL LOW (ref 8.9–10.3)
Creatinine, Ser: 2.5 mg/dL — ABNORMAL HIGH (ref 0.44–1.00)
GFR calc non Af Amer: 18 mL/min — ABNORMAL LOW (ref >60)
GFR, EST AFRICAN AMERICAN: 21 mL/min — AB (ref >60)
Glucose, Bld: 122 mg/dL — ABNORMAL HIGH (ref 65–99)
POTASSIUM: 3.5 mmol/L (ref 3.5–5.1)
SODIUM: 140 mmol/L (ref 135–145)

## 2016-02-22 LAB — HEPARIN LEVEL (UNFRACTIONATED): Heparin Unfractionated: 0.42 IU/mL (ref 0.30–0.70)

## 2016-02-22 MED ORDER — HEPARIN (PORCINE) IN NACL 100-0.45 UNIT/ML-% IJ SOLN
1450.0000 [IU]/h | INTRAMUSCULAR | Status: DC
  Administered 2016-02-22 – 2016-02-23 (×2): 1450 [IU]/h via INTRAVENOUS
  Filled 2016-02-22 (×3): qty 250

## 2016-02-22 MED ORDER — METOLAZONE 10 MG PO TABS
10.0000 mg | ORAL_TABLET | Freq: Once | ORAL | Status: AC
  Administered 2016-02-22: 10 mg via ORAL
  Filled 2016-02-22: qty 1

## 2016-02-22 MED ORDER — WHITE PETROLATUM GEL
Status: AC
  Administered 2016-02-22: 15:00:00
  Filled 2016-02-22: qty 1

## 2016-02-22 MED ORDER — DOCUSATE SODIUM 50 MG/5ML PO LIQD
50.0000 mg | Freq: Two times a day (BID) | ORAL | Status: DC
  Administered 2016-02-22 – 2016-02-27 (×8): 50 mg via ORAL
  Filled 2016-02-22 (×13): qty 10

## 2016-02-22 MED ORDER — POTASSIUM CHLORIDE 20 MEQ/15ML (10%) PO SOLN
40.0000 meq | Freq: Once | ORAL | Status: AC
  Administered 2016-02-22: 40 meq
  Filled 2016-02-22: qty 30

## 2016-02-22 MED ORDER — POTASSIUM CHLORIDE 20 MEQ/15ML (10%) PO SOLN
40.0000 meq | Freq: Every day | ORAL | Status: DC
  Administered 2016-02-22 – 2016-02-23 (×2): 40 meq
  Filled 2016-02-22 (×3): qty 30

## 2016-02-22 MED ORDER — SENNOSIDES 8.8 MG/5ML PO SYRP
5.0000 mL | ORAL_SOLUTION | Freq: Two times a day (BID) | ORAL | Status: DC
  Administered 2016-02-22 – 2016-02-27 (×8): 5 mL via ORAL
  Filled 2016-02-22 (×12): qty 5

## 2016-02-22 MED ORDER — FERROUS SULFATE 300 (60 FE) MG/5ML PO SYRP
300.0000 mg | ORAL_SOLUTION | Freq: Every day | ORAL | Status: DC
  Administered 2016-02-23 – 2016-02-26 (×4): 300 mg via ORAL
  Filled 2016-02-22 (×5): qty 5

## 2016-02-22 NOTE — Progress Notes (Signed)
ANTICOAGULATION CONSULT NOTE - Follow Up Consult  Pharmacy Consult for Heparin Indication: atrial fibrillation  Allergies  Allergen Reactions  . Bee Venom Swelling  . Doxycycline     Hoarseness / throat irritation  . Latex Rash  . Penicillins Rash    Patient Measurements: Height: 5\' 2"  (157.5 cm) Weight: 236 lb 5.3 oz (107.2 kg) IBW/kg (Calculated) : 50.1 Heparin Dosing Weight: 76 kg  Vital Signs: Temp: 99 F (37.2 C) (06/21 1547) Temp Source: Oral (06/21 1547) BP: 113/49 mmHg (06/21 1800) Pulse Rate: 75 (06/21 1800)  Labs:  Recent Labs  02/20/16 1000  02/20/16 1825 02/21/16 0209 02/21/16 0838 02/21/16 2209 02/22/16 0230 02/22/16 1812  HGB  --   --   --  6.3* 6.3* 7.8* 7.8* 7.5*  HCT  --   --   --  21.2* 20.5* 25.6* 25.6* 24.9*  PLT  --   --   --  125* 112*  --  132* 133*  HEPARINUNFRC  --   < > 0.39  --  0.34  --   --  0.42  CREATININE 2.82*  --   --  2.70*  --   --  2.50*  --   < > = values in this interval not displayed.  Estimated Creatinine Clearance: 23.1 mL/min (by C-G formula based on Cr of 2.5).   Assessment: 75 year old female with history of atrial fibrillation on Pradaxa PTA (last dose 6/14 at 2110PM) now transitioned to IV heparin.   Heparin held yesterday due to bleeding from PICC yesterday PM and drop in Hgb. Now s/p 2u PBRC with Hgb 7.8, plt up to 132. No overt bleeding.  Previously therapeutic at 1550 units/h, will restart at lower dose d/t bleeding with therapeutic rate.  1800 HL therapeutic at 0.42, HgB = 7.5 (slight decrease only)   Goal of Therapy:  Heparin level 0.3-0.7 units/ml Monitor platelets by anticoagulation protocol: Yes   Plan:  Continue heparin at 1450 units/hr Daily heparin level and CBC Monitor s/sx bleeding  Thank you Okey RegalLisa Jhovani Griswold, PharmD [phone] 02/22/2016 6:47 PM

## 2016-02-22 NOTE — Progress Notes (Signed)
ANTICOAGULATION CONSULT NOTE - Follow Up Consult  Pharmacy Consult for Heparin Indication: atrial fibrillation  Allergies  Allergen Reactions  . Bee Venom Swelling  . Doxycycline     Hoarseness / throat irritation  . Latex Rash  . Penicillins Rash    Patient Measurements: Height: 5\' 2"  (157.5 cm) Weight: 236 lb 5.3 oz (107.2 kg) IBW/kg (Calculated) : 50.1 Heparin Dosing Weight: 76 kg  Vital Signs: Temp: 99.1 F (37.3 C) (06/21 0801) Temp Source: Oral (06/21 0801) BP: 112/45 mmHg (06/21 0800) Pulse Rate: 78 (06/21 0800)  Labs:  Recent Labs  02/20/16 1000 02/20/16 1100 02/20/16 1825 02/21/16 0209 02/21/16 0838 02/21/16 2209 02/22/16 0230  HGB  --   --   --  6.3* 6.3* 7.8* 7.8*  HCT  --   --   --  21.2* 20.5* 25.6* 25.6*  PLT  --   --   --  125* 112*  --  132*  HEPARINUNFRC  --  0.26* 0.39  --  0.34  --   --   CREATININE 2.82*  --   --  2.70*  --   --  2.50*    Estimated Creatinine Clearance: 23.1 mL/min (by C-G formula based on Cr of 2.5).   Assessment: 75 year old female with history of atrial fibrillation on Pradaxa PTA (last dose 6/14 at 2110PM) now transitioned to IV heparin.   Heparin held yesterday due to bleeding from PICC yesterday PM and drop in Hgb. Now s/p 2u PBRC with Hgb 7.8, plt up to 132. No overt bleeding.  Previously therapeutic at 1550 units/h, will restart at lower dose d/t bleeding with therapeutic rate.   Goal of Therapy:  Heparin level 0.3-0.7 units/ml Monitor platelets by anticoagulation protocol: Yes   Plan:  Heparin at 1450 units/hr 1800 HL Daily heparin level and CBC Monitor s/sx bleeding   Hillery AldoElizabeth Carliss Porcaro, Pharm.D., BCPS PGY2 Cardiology Pharmacy Resident Pager: [phone]  02/22/2016 9:28 AM

## 2016-02-22 NOTE — Progress Notes (Signed)
eLink Physician-Brief Progress Note Patient Name: Sandra AdasRita Boniface DOB: [birth year] MRN: [id number]   Date of Service  02/22/2016  HPI/Events of Note  Bleeding gums - Patient on Heparin IV infusion for AFIB.   eICU Interventions  Continue Heparin IV infusion for present. If bleeding becomes more profuse will have to stop Heparin IV infusion.      Intervention Category Intermediate Interventions: Other:  Lenell AntuSommer,Steven Eugene 02/22/2016, 8:40 PM

## 2016-02-22 NOTE — Progress Notes (Signed)
PULMONARY / CRITICAL CARE MEDICINE   Name: Sandra Turner MRN: [id number] DOB: 1941/07/11    ADMISSION DATE:  03/02/2016 CONSULTATION DATE:  02/16/2016  REFERRING MD:  Christy Gentles, M.D. / [facility]  CHIEF COMPLAINT:  Shortness of breath  HISTORY OF PRESENT ILLNESS:  75 year old female well known to the  pulmonary and critical care service who comes to [facility] on 02/10/2016 in the setting of dyspnea. She was admitted that day and was noted to have acute kidney injury and a low hemoglobin value. She was admitted by the hospitalist service and nephrology was consulted. She was administered packed red blood cells and Lasix was given as well. Despite these measures she had ongoing respiratory distress with hypoxemia. She ultimately required intubation on June 15. Pulmonary and critical care medicine in [place] was asked to take the patient on transfer because of no pulmonary and critical care support at [facility].   SUBJECTIVE:  RT reports pt weaning on PSV.  RN notes arm bleeding has stopped.  Net neg 342.    VITAL SIGNS: BP 112/45 mmHg  Pulse 78  Temp(Src) 99.1 F (37.3 C) (Oral)  Resp 18  Ht 5\' 2"  (1.575 m)  Wt 236 lb 5.3 oz (107.2 kg)  BMI 43.21 kg/m2  SpO2 100%  HEMODYNAMICS:    VENTILATOR SETTINGS: Vent Mode:  [-] CPAP;PSV FiO2 (%):  [40 %] 40 % Set Rate:  [14 bmp] 14 bmp Vt Set:  [440 mL] 440 mL PEEP:  [5 cmH20-8 cmH20] 5 cmH20 Pressure Support:  [5 cmH20] 5 cmH20 Plateau Pressure:  [20 cmH20-24 cmH20] 20 cmH20  INTAKE / OUTPUT:  Intake/Output Summary (Last 24 hours) at 02/22/16 0854 Last data filed at 02/22/16 0800  Gross per 24 hour  Intake   2390 ml  Output   2750 ml  Net   -360 ml     PHYSICAL EXAMINATION: General: No acute distress opens eyes w/ voice  Integument: Warm & dry. Pressure dsg/ace wrap to right upper arm, with extensive ecchymosis to lower arm. Weeping to bilateral upper extremities. Scaling dermis in lower  extremities.  HEENT: Endotracheal tube in place, supple, no JVD Cardiovascular: irregular rhythm regular rate, Anasarca, +mumur Pulmonary: Wheezing throughout, diminished bibasilarly. Symmetric chest wall rise on ventilator Abdomen: Soft. Normal bowel sounds x 4. Protuberant/obese Musculoskeletal: Normal bulk. No joint deformity or effusion appreciated Neurological: PERRL, follows commands, generalized weakness, MAE  LABS:  BMET  Recent Labs Lab 02/20/16 1000 02/21/16 0209 02/22/16 0230  NA 139 138 140  K 4.0 3.6 3.5  CL 95* 96* 95*  CO2 33* 34* 33*  BUN 106* 117* 119*  CREATININE 2.82* 2.70* 2.50*  GLUCOSE 135* 119* 122*    Electrolytes  Recent Labs Lab 02/16/16 0213 02/17/16 0120  02/20/16 1000 02/21/16 0209 02/22/16 0230  CALCIUM 8.7* 8.7*  < > 8.2* 7.9* 8.6*  MG  --  2.4  --   --  2.3  --   PHOS 4.7* 3.8  --   --  4.5  --   < > = values in this interval not displayed.  CBC  Recent Labs Lab 02/21/16 0209 02/21/16 0838 02/21/16 2209 02/22/16 0230  WBC 8.7 9.1  --  9.0  HGB 6.3* 6.3* 7.8* 7.8*  HCT 21.2* 20.5* 25.6* 25.6*  PLT 125* 112*  --  132*    Coag's  Recent Labs Lab 02/17/16 0120  APTT 82*  INR 3.21*    Sepsis Markers  Recent Labs Lab 02/16/16 [phone]  02/18/16 0416 02/20/16 0340  PROCALCITON 0.15 0.38 0.72    ABG  Recent Labs Lab 02/16/16 2008 02/18/16 0300 02/21/16 0400  PHART 7.554* 7.484* 7.469*  PCO2ART 46.4* 52.1* 49.8*  PO2ART 50.2* 60.4* 90.5    Liver Enzymes  Recent Labs Lab 02/16/16 0213 02/17/16 0120  AST  --  26  ALT  --  14  ALKPHOS  --  78  BILITOT  --  2.3*  ALBUMIN 3.4* 2.6*    Cardiac Enzymes  Recent Labs Lab 02/17/16 0120 02/17/16 0625 02/17/16 1300  TROPONINI 0.03 0.03 0.04*    Glucose  Recent Labs Lab 02/21/16 1133 02/21/16 1504 02/21/16 2005 02/22/16 0022 02/22/16 0402 02/22/16 0753  GLUCAP 135* 141* 141* 136* 119* 122*    Imaging Dg Chest Port 1 View  02/22/2016   CLINICAL DATA:  Respiratory failure. EXAM: PORTABLE CHEST 1 VIEW COMPARISON:  02/21/2016. FINDINGS: Endotracheal tube and NG tube in stable position. Prior cardiac valve replacement. Left atrial appendage clip noted. Cardiomegaly with persistent bilateral pulmonary infiltrates consistent pulmonary edema. Persistent bilateral effusions. No interim change. No pneumothorax. Degenerative changes both shoulders. Sclerotic changes right humeral head. Avascular necrosis cannot be excluded. IMPRESSION: 1.  Lines and tubes in stable position. 2. Prior cardiac valve replacement. Cardiomegaly with diffuse bilateral pulmonary edema and bilateral pleural effusions again noted. No interim change. 3. Degenerative changes both shoulders. Sclerotic changes of the right humeral head. Avascular necrosis cannot be excluded. Electronically Signed   By: Maisie Fushomas  Register   On: 02/22/2016 06:55     STUDIES:  Echo (03/16/15): LV EF: 55%-60% TTE (03/16/15): LV EF 55-60, mildly calcified mitral valve, bioprosthetic aortic valve functioning well but poorly visualized, right ventricle moderately dilated, RVSP estimate 44 mmHg, right atrium severely dilated, mild to moderate tricuspid regurgitation PFT (04/15/15): FVC 0.96 L (36%) FEV1 0.74 L (36%) FEV1/FVC 0.76 no bronchodilator response TLC 2.90 L (61%) DLCO uncorrected 40% CXR PA/LAT 6/11: Silhouetting of bilateral hemidiaphragms with lower lobe opacification suggestive of pleural effusions. PORT CXR 6/15: Patchy bilateral alveolar opacities now involving the upper lung zones. Improved visualization of hemidiaphragms bilaterally. Endotracheal tube & orogastric tube in good position. Right PICC line with tip in SVC. Blunting of right costocardiac angle suggestive of pleural effusion. CT Abdomen Pelvis 6/16: Moderate right and small left pleural effusions, with patchy bibasilar airspace opacities, which may reflect pneumonia or pulmonary edema. Small amount of ascites within the abdomen  and pelvis, some of which is high in attenuation, of uncertain significance. Vague nonspecific soft tissue inflammation about the pancreas may simply reflect underlying edema. Would correlate with the patient's symptoms. Diffuse soft tissue edema tracking about the abdomen and pelvis, concerning for anasarca. Scattered diverticulosis along the sigmoid colon without evidence of diverticulitis. Mild cardiomegaly. Diffuse coronary artery calcifications seen.  MICROBIOLOGY: MRSA PCR 6/11:  Positive Blood Ctx x2 6/15 >> negative Tracheal Asp Ctx 6/15 >> negative Urine Strep Ag 6/15:  Negative   ANTIBIOTICS: Aztreonam 6/15 >> 6/19 Vancomycin 6/15 >>  Cefepime 6/19>>   SIGNIFICANT EVENTS: 6/11 - [facility]mission to [facility] 6/11 - Transfused 2u PRBC 6/15 - Transfused 3u PRBC 6/15 - Intubation for respiratory failure and [facility] 6/17 - started on lasix gtt.  6/18 - weaning, CXR a little better. Had family meeting. Goals of care discussed. Made DNR.  6/20 - heparin gtt held with bleeding from prior picc line site 6/21 - restarted heparin  LINES/TUBES: OETT 7.5 6/15 >> OGT 6/15 >> FOLEY 6/11>> R TL PICC  6/15 >> 6/19 PIV x2  DISCUSSION:  75 year old female with a past medical history significant for aortic valve replacement in 2016 complicated by recurrent episodes of respiratory failure with hypoxemia due to mucus plugging and underlying severe asthma who returned to the hospital on 02/16/2016 in the setting of acute kidney injury, anemia, and acute respiratory failure with hypoxemia. She has a recent history of recurrent episodes of diastolic heart failure requiring multiple hospitalizations.  Working dx at this point is decompensated diastolic HF c/b ALI which was either the result of TRALI or HCAP (NOS). She has been on abx, we added lasix gtt and she is making some improvement both clinically and radiographically. At this point the plan will be to cont IV abx, cont lasix gtt  and re-assess for extubation daily. Have discussed w/ family and made her DNR. Would favor one-way extubation and will need to verify this prior to extubation. In the best case scenario looking at hospice after.   ASSESSMENT / PLAN:  PULMONARY A: Acute Respiratory Failure with Hypoxia - Volume overload vs TRALI vs HCAP. Possible ARDS, clearly ARDS physiology Possible HCAP H/O Asthma H/O Tracheomalacia  P:   PRVC 8 cc/kg Wean PEEP / FiO2 for sats > 92% Daily SBT & WUA Trend CXR Xopenex & Atrovent Nebs q6hr Lasix as BP/BUN/creatinine allow  Hope for extubation soon-->would favor no re-intubation   CARDIOVASCULAR A:  Chronic Diastolic CHF H/O Atrial fibrillation - S/P MAZE 2016 H/O Aortic Stenosis - S/P bioprosthetic aortic valve.  P:  Telemetry monitoring  Vitals per unit protocol Hold Pradaxa for anemia, will restart when able given AVR Heparin gtt for now per pharmacy protocol >> resume 6/21 Decrease Diltizem to 30 mg VT q8hr Cont lasix @ 8 mg/hr  RENAL A:   Acute on Chronic Renal Failure - Improving with diuresis (baseline creatinine appears to be in to 2 to 2.3 range) Borderline hyperkalemia  Metabolic Akalosis  P:   Trend UOP   Monitoring electrolytes & renal function daily Replace electrolytes as needed Lasix 8 mg/hr drip. KCL 40 mEq x1   GASTROINTESTINAL A:   Severe protein calorie malnutrition   P:   Pepcid IV q12hr Cont tube feeds  HEMATOLOGIC A:   Chronic Anticoagulation - Pradaxa for A fib. Anemia - No obvious bleeding source. S/P 2u PRBC 6/11 & 3u PRBC 6/15.  hgb drifting. Got 1 unit PRBC 6/18 Thrombocytopenia  P:  Trending cell counts daily w/ CBC SCDs Transfuse for Hgb <7.0  Ferrous sulfate PT TID  INFECTIOUS A:   HCAP  P:   Empiric antibiotics, transition to vanc/cefepime (took it last year without difficulty) and have stop date at 6/22. Monitor fever curve / WBC trend  ENDOCRINE A:   Hyperglycemia - Mild.    P:   Monitor  glucose on daily labs.  NEUROLOGIC A:   Sedation on Ventilator.  P:   RASS goal:  0 to -1 PRN sedation only   FAMILY:  - Updates:  No family available at bedside, patient updated 6/21.  Family meeting planned for 2pm.      - Inter-disciplinary family meet or Palliative Care meeting due by:  6/22   Canary Brim, NP-C [place] [facility] Pgr: [phone] or if no answer [phone] 02/22/2016, 8:54 AM  Attending Note:  75 year old female with a complicated PMH presenting with HCAP vs pulmonary edema induced respiratory failure. Patient has been rather slow to wean. On exam, diffuse crackles. CXR that I reviewed myself  persistent pulmonary edema. Brother is to arrived yesterday late, to come in today at 2 PM for conversation regarding plan of care. Not yet time to start considering trach. Renal function improving and UOP improving, will continue with lasix drip 8 mg/h and add zaroxolyn. Plan is to extubate with no intention to reintubate once ready.  The patient is critically ill with multiple organ systems failure and requires high complexity decision making for assessment and support, frequent evaluation and titration of therapies, application of advanced monitoring technologies and extensive interpretation of multiple databases.   Critical Care Time devoted to patient care services described in this note is 35 Minutes. This time reflects time of care of this signee Dr Koren Bound. This critical care time does not reflect procedure time, or teaching time or supervisory time of PA/NP/Med student/Med Resident etc but could involve care discussion time.  Alyson Reedy, M.D. [facility] Pulmonary/Critical Care Medicine. Pager: [phone]. After hours pager: [phone].

## 2016-02-23 ENCOUNTER — Inpatient Hospital Stay (HOSPITAL_COMMUNITY): Payer: Medicare Other

## 2016-02-23 LAB — CBC
HCT: 25.2 % — ABNORMAL LOW (ref 36.0–46.0)
HEMOGLOBIN: 7.7 g/dL — AB (ref 12.0–15.0)
MCH: 26.3 pg (ref 26.0–34.0)
MCHC: 30.6 g/dL (ref 30.0–36.0)
MCV: 86 fL (ref 78.0–100.0)
Platelets: 138 10*3/uL — ABNORMAL LOW (ref 150–400)
RBC: 2.93 MIL/uL — AB (ref 3.87–5.11)
RDW: 22.1 % — ABNORMAL HIGH (ref 11.5–15.5)
WBC: 7.7 10*3/uL (ref 4.0–10.5)

## 2016-02-23 LAB — GLUCOSE, CAPILLARY
GLUCOSE-CAPILLARY: 110 mg/dL — AB (ref 65–99)
Glucose-Capillary: 124 mg/dL — ABNORMAL HIGH (ref 65–99)
Glucose-Capillary: 114 mg/dL — ABNORMAL HIGH (ref 65–99)
Glucose-Capillary: 117 mg/dL — ABNORMAL HIGH (ref 65–99)
Glucose-Capillary: 117 mg/dL — ABNORMAL HIGH (ref 65–99)
Glucose-Capillary: 119 mg/dL — ABNORMAL HIGH (ref 65–99)

## 2016-02-23 LAB — PHOSPHORUS: Phosphorus: 4.5 mg/dL (ref 2.5–4.6)

## 2016-02-23 LAB — MAGNESIUM: MAGNESIUM: 2.5 mg/dL — AB (ref 1.7–2.4)

## 2016-02-23 LAB — POCT I-STAT 3, ART BLOOD GAS (G3+)
ACID-BASE EXCESS: 13 mmol/L — AB (ref 0.0–2.0)
Bicarbonate: 37.6 mEq/L — ABNORMAL HIGH (ref 20.0–24.0)
O2 Saturation: 94 %
PH ART: 7.496 — AB (ref 7.350–7.450)
TCO2: 39 mmol/L (ref 0–100)
pCO2 arterial: 48.6 mmHg — ABNORMAL HIGH (ref 35.0–45.0)
pO2, Arterial: 66 mmHg — ABNORMAL LOW (ref 80.0–100.0)

## 2016-02-23 LAB — BASIC METABOLIC PANEL
Anion gap: 12 (ref 5–15)
BUN: 125 mg/dL — AB (ref 6–20)
CO2: 33 mmol/L — ABNORMAL HIGH (ref 22–32)
CREATININE: 2.18 mg/dL — AB (ref 0.44–1.00)
Calcium: 8.7 mg/dL — ABNORMAL LOW (ref 8.9–10.3)
Chloride: 95 mmol/L — ABNORMAL LOW (ref 101–111)
GFR, EST AFRICAN AMERICAN: 25 mL/min — AB (ref >60)
GFR, EST NON AFRICAN AMERICAN: 21 mL/min — AB (ref >60)
Glucose, Bld: 108 mg/dL — ABNORMAL HIGH (ref 65–99)
POTASSIUM: 3.6 mmol/L (ref 3.5–5.1)
SODIUM: 140 mmol/L (ref 135–145)

## 2016-02-23 LAB — HEPARIN LEVEL (UNFRACTIONATED)
HEPARIN UNFRACTIONATED: 0.49 [IU]/mL (ref 0.30–0.70)
HEPARIN UNFRACTIONATED: 0.43 [IU]/mL (ref 0.30–0.70)

## 2016-02-23 MED ORDER — FUROSEMIDE 10 MG/ML IJ SOLN
8.0000 mg/h | INTRAVENOUS | Status: DC
  Administered 2016-02-23: 8 mg/h via INTRAVENOUS
  Filled 2016-02-23: qty 25

## 2016-02-23 MED ORDER — HEPARIN (PORCINE) IN NACL 100-0.45 UNIT/ML-% IJ SOLN
1400.0000 [IU]/h | INTRAMUSCULAR | Status: DC
  Administered 2016-02-23: 1400 [IU]/h via INTRAVENOUS
  Filled 2016-02-23 (×4): qty 250

## 2016-02-23 NOTE — Progress Notes (Signed)
PULMONARY / CRITICAL CARE MEDICINE   Name: Sandra Turner MRN: [id number] DOB: Sep 01, 1941    ADMISSION DATE:  2016-03-13 CONSULTATION DATE:  02/16/2016  REFERRING MD:  Christy Gentles, M.D. / [facility]  CHIEF COMPLAINT:  Shortness of breath  HISTORY OF PRESENT ILLNESS:  75 year old female well known to the [place] pulmonary and critical care service who comes to [facility] on March 13, 2016 in the setting of dyspnea. She was admitted that day and was noted to have acute kidney injury and a low hemoglobin value. She was admitted by the hospitalist service and nephrology was consulted. She was administered packed red blood cells and Lasix was given as well. Despite these measures she had ongoing respiratory distress with hypoxemia. She ultimately required intubation on June 15. Pulmonary and critical care medicine in [place] was asked to take the patient on transfer because of no pulmonary and critical care support at [facility].   SUBJECTIVE:  RN reports mild oral / lip bleeding.  Mostly old blood, heparin gtt decreased to 1400 units/hr.  No acute events.  Pt weaned 8 hours on 6/21.  Net neg 1.8 L, sr cr improved to 2.18  VITAL SIGNS: BP 117/50 mmHg  Pulse 68  Temp(Src) 98.9 F (37.2 C) (Oral)  Resp 18  Ht 5\' 2"  (1.575 m)  Wt 236 lb 1.8 oz (107.1 kg)  BMI 43.17 kg/m2  SpO2 97%  HEMODYNAMICS:    VENTILATOR SETTINGS: Vent Mode:  [-] PSV;PRVC FiO2 (%):  [40 %] 40 % Set Rate:  [14 bmp-16 bmp] 16 bmp Vt Set:  [440 mL] 440 mL PEEP:  [5 cmH20] 5 cmH20 Pressure Support:  [5 cmH20] 5 cmH20 Plateau Pressure:  [14 cmH20-27 cmH20] 27 cmH20  INTAKE / OUTPUT:  Intake/Output Summary (Last 24 hours) at 02/23/16 0857 Last data filed at 02/23/16 0830  Gross per 24 hour  Intake 1380.25 ml  Output   2975 ml  Net -1594.75 ml     PHYSICAL EXAMINATION: General: No acute distress opens eyes w/ voice  Integument: Warm & dry. Pressure dsg/ace wrap to right upper arm, with  extensive ecchymosis to lower arm. Weeping to bilateral upper extremities. Scaling dermis in lower extremities.  HEENT: Endotracheal tube in place, supple, no JVD Cardiovascular: irregular rhythm regular rate, Anasarca, +mumur Pulmonary: Wheezing throughout, diminished bibasilarly. Symmetric chest wall rise on ventilator Abdomen: Soft. Normal bowel sounds x 4. Protuberant/obese Musculoskeletal: Normal bulk. No joint deformity or effusion appreciated Neurological: PERRL, follows commands, generalized weakness, MAE  LABS:  BMET  Recent Labs Lab 02/21/16 0209 02/22/16 0230 02/23/16 0327  NA 138 140 140  K 3.6 3.5 3.6  CL 96* 95* 95*  CO2 34* 33* 33*  BUN 117* 119* 125*  CREATININE 2.70* 2.50* 2.18*  GLUCOSE 119* 122* 108*    Electrolytes  Recent Labs Lab 02/17/16 0120  02/21/16 0209 02/22/16 0230 02/23/16 0327  CALCIUM 8.7*  < > 7.9* 8.6* 8.7*  MG 2.4  --  2.3  --  2.5*  PHOS 3.8  --  4.5  --  4.5  < > = values in this interval not displayed.  CBC  Recent Labs Lab 02/22/16 0230 02/22/16 1812 02/23/16 0327  WBC 9.0 8.2 7.7  HGB 7.8* 7.5* 7.7*  HCT 25.6* 24.9* 25.2*  PLT 132* 133* 138*    Coag's  Recent Labs Lab 02/17/16 0120  APTT 82*  INR 3.21*    Sepsis Markers  Recent Labs Lab 02/18/16 0416 02/20/16 0340  PROCALCITON 0.38 0.72  ABG  Recent Labs Lab 02/18/16 0300 02/21/16 0400 02/23/16 0442  PHART 7.484* 7.469* 7.496*  PCO2ART 52.1* 49.8* 48.6*  PO2ART 60.4* 90.5 66.0*    Liver Enzymes  Recent Labs Lab 02/17/16 0120  AST 26  ALT 14  ALKPHOS 78  BILITOT 2.3*  ALBUMIN 2.6*    Cardiac Enzymes  Recent Labs Lab 02/17/16 0120 02/17/16 0625 02/17/16 1300  TROPONINI 0.03 0.03 0.04*    Glucose  Recent Labs Lab 02/22/16 1054 02/22/16 1545 02/22/16 1920 02/22/16 2315 02/23/16 0319 02/23/16 0737  GLUCAP 122* 120* 127* 124* 114* 110*    Imaging Dg Chest Port 1 View  02/23/2016  CLINICAL DATA:  Shortness of  breath. EXAM: PORTABLE CHEST 1 VIEW COMPARISON:  02/22/2016. FINDINGS: Endotracheal tube, NG tube in stable position. Prior cardiac valve replacement. Left atrial appendage clip. Persistent cardiomegaly. Diffuse bilateral pulmonary alveolar infiltrates again noted without interim change. Findings consistent pulmonary edema. Bilateral pleural effusions again noted without change. No pneumothorax. IMPRESSION: 1. Lines and tubes in stable position. 2. Prior cardiac valve replacement. Persistent cardiomegaly with diffuse bilateral pulmonary infiltrates consistent with pulmonary edema. Bilateral pleural effusions again noted. No interim change. Electronically Signed   By: Maisie Fushomas  Register   On: 02/23/2016 07:05     STUDIES:  Echo (03/16/15): LV EF: 55%-60% TTE (03/16/15): LV EF 55-60, mildly calcified mitral valve, bioprosthetic aortic valve functioning well but poorly visualized, right ventricle moderately dilated, RVSP estimate 44 mmHg, right atrium severely dilated, mild to moderate tricuspid regurgitation PFT (04/15/15): FVC 0.96 L (36%) FEV1 0.74 L (36%) FEV1/FVC 0.76 no bronchodilator response TLC 2.90 L (61%) DLCO uncorrected 40% CXR PA/LAT 6/11: Silhouetting of bilateral hemidiaphragms with lower lobe opacification suggestive of pleural effusions. PORT CXR 6/15: Patchy bilateral alveolar opacities now involving the upper lung zones. Improved visualization of hemidiaphragms bilaterally. Endotracheal tube & orogastric tube in good position. Right PICC line with tip in SVC. Blunting of right costocardiac angle suggestive of pleural effusion. CT Abdomen Pelvis 6/16: Moderate right and small left pleural effusions, with patchy bibasilar airspace opacities, which may reflect pneumonia or pulmonary edema. Small amount of ascites within the abdomen and pelvis, some of which is high in attenuation, of uncertain significance. Vague nonspecific soft tissue inflammation about the pancreas may simply reflect  underlying edema. Would correlate with the patient's symptoms. Diffuse soft tissue edema tracking about the abdomen and pelvis, concerning for anasarca. Scattered diverticulosis along the sigmoid colon without evidence of diverticulitis. Mild cardiomegaly. Diffuse coronary artery calcifications seen.  MICROBIOLOGY: MRSA PCR 6/11:  Positive Blood Ctx x2 6/15 >> negative Tracheal Asp Ctx 6/15 >> negative Urine Strep Ag 6/15:  Negative   ANTIBIOTICS: Aztreonam 6/15 >> 6/19 Vancomycin 6/15 >> 6/22 Cefepime 6/19>> 6/22  SIGNIFICANT EVENTS: 6/11 - Admission to [facility] 6/11 - Transfused 2u PRBC 6/15 - Transfused 3u PRBC 6/15 - Intubation for respiratory failure and [facility] 6/17 - started on lasix gtt.  6/18 - weaning, CXR a little better. Had family meeting. Goals of care discussed. Made DNR.  6/20 - heparin gtt held with bleeding from prior picc line site 6/21 - restarted heparin, weaned 8 hours on PSV  LINES/TUBES: OETT 7.5 6/15 >> OGT 6/15 >> FOLEY 6/11>> R TL PICC 6/15 >> 6/19 PIV x2  DISCUSSION:  75 year old female with a past medical history significant for aortic valve replacement in 2016 complicated by recurrent episodes of respiratory failure with hypoxemia due to mucus plugging and underlying severe asthma who returned to the  hospital on 02/16/2016 in the setting of acute kidney injury, anemia, and acute respiratory failure with hypoxemia. She has a recent history of recurrent episodes of diastolic heart failure requiring multiple hospitalizations.  Working dx at this point is decompensated diastolic HF c/b ALI which was either the result of TRALI or HCAP (NOS). She has been on abx, we added lasix gtt and she is making some improvement both clinically and radiographically. At this point the plan will be to cont IV abx, cont lasix gtt and re-assess for extubation daily. Have discussed w/ family and made her DNR. Would favor one-way extubation and will need to  verify this prior to extubation. In the best case scenario looking at hospice after.   ASSESSMENT / PLAN:  PULMONARY A: Acute Respiratory Failure with Hypoxia - Volume overload vs TRALI vs HCAP. Possible ARDS, clearly ARDS physiology Possible HCAP H/O Asthma H/O Tracheomalacia  P:   PRVC 8 cc/kg Wean PEEP / FiO2 for sats > 92% Daily SBT & WUA Trend CXR Xopenex & Atrovent Nebs q6hr Lasix as BP/BUN/creatinine allow  Hope for extubation soon-->would favor no re-intubation   CARDIOVASCULAR A:  Chronic Diastolic CHF H/O Atrial fibrillation - S/P MAZE 2016 H/O Aortic Stenosis - S/P bioprosthetic aortic valve.  P:  Telemetry monitoring  Vitals per unit protocol Hold Pradaxa for anemia, will restart when able given AVR Heparin gtt for now per pharmacy protocol >> resume 6/21 Decrease Diltizem to 30 mg VT q8hr Cont lasix @ 8 mg/hr  RENAL A:   Acute on Chronic Renal Failure - Improving with diuresis (baseline creatinine appears to be in to 2 to 2.3 range) Borderline hyperkalemia  Metabolic Akalosis  P:   Trend UOP   Monitoring electrolytes & renal function daily Replace electrolytes as needed Lasix 8 mg/hr drip. KCL 40 mEq QD while on lasix gtt  GASTROINTESTINAL A:   Severe protein calorie malnutrition   P:   Pepcid IV q12hr Cont tube feeds  HEMATOLOGIC A:   Chronic Anticoagulation - Pradaxa for A fib. Anemia - No obvious bleeding source. S/P 2u PRBC 6/11 & 3u PRBC 6/15.  hgb drifting. Got 1 unit PRBC 6/18 Thrombocytopenia  P:  Trend daily CBC SCDs Transfuse for Hgb <7.0  Ferrous sulfate PT TID  INFECTIOUS A:   HCAP  P:   Empiric antibiotics, transition to vanc/cefepime (took it last year without difficulty) and have stop date at 6/22. Monitor fever curve / WBC trend  ENDOCRINE A:   Hyperglycemia - Mild.    P:   Monitor glucose on daily labs.  NEUROLOGIC A:   Sedation on Ventilator.  P:   RASS goal:  0   PRN sedation only   FAMILY:  -  Updates:  No family available at bedside, patient updated 6/21.  Family meeting planned for 2pm.      - Inter-disciplinary family meet or Palliative Care meeting due by:  6/22  - GOC: Family discussion on 6/21.  Her brother is going to discuss with his sister regarding what they feel the patient would want in this situation and get back to us.   Canary BrimBrandi Ollis, NP-C [place] [facility] Pgr: [phone] or if no answer [phone] 02/23/2016, 8:57 AM  Attending Note:  75 year old female with a complicated PMH presenting with HCAP vs pulmonary edema induced respiratory failure. Patient has been rather slow to wean. On exam, diffuse crackles. CXR that I reviewed myself persistent pulmonary edema. Spoke with brother, he does not wish  to make a decision since they have not spoken for 20 years.  Plan will be to diurese her then extubate in AM then ask her her wishes.  Continue lasix drip with zaroxolyn in the meantime.  The patient is critically ill with multiple organ systems failure and requires high complexity decision making for assessment and support, frequent evaluation and titration of therapies, application of advanced monitoring technologies and extensive interpretation of multiple databases.   Critical Care Time devoted to patient care services described in this note is 35 Minutes. This time reflects time of care of this signee Dr Koren Bound. This critical care time does not reflect procedure time, or teaching time or supervisory time of PA/NP/Med student/Med Resident etc but could involve care discussion time.  Alyson Reedy, M.D. [facility] Pulmonary/Critical Care Medicine. Pager: [phone]. After hours pager: [phone]. 75 year old female with MODS, fluid overloaded.

## 2016-02-23 NOTE — Progress Notes (Signed)
ANTICOAGULATION CONSULT NOTE - Follow Up Consult  Pharmacy Consult for Heparin Indication: atrial fibrillation  Allergies  Allergen Reactions  . Bee Venom Swelling  . Doxycycline     Hoarseness / throat irritation  . Latex Rash  . Penicillins Rash    Patient Measurements: Height: 5\' 2"  (157.5 cm) Weight: 236 lb 1.8 oz (107.1 kg) IBW/kg (Calculated) : 50.1 Heparin Dosing Weight: 76 kg  Vital Signs: Temp: 98.9 F (37.2 C) (06/22 0739) Temp Source: Oral (06/22 0739) BP: 117/50 mmHg (06/22 0800) Pulse Rate: 68 (06/22 0800)  Labs:  Recent Labs  02/21/16 0209 02/21/16 0838  02/22/16 0230 02/22/16 1812 02/23/16 0327  HGB 6.3* 6.3*  < > 7.8* 7.5* 7.7*  HCT 21.2* 20.5*  < > 25.6* 24.9* 25.2*  PLT 125* 112*  --  132* 133* 138*  HEPARINUNFRC  --  0.34  --   --  0.42 0.49  CREATININE 2.70*  --   --  2.50*  --  2.18*  < > = values in this interval not displayed.  Estimated Creatinine Clearance: 26.5 mL/min (by C-G formula based on Cr of 2.18).   Assessment: 75 year old female with history of atrial fibrillation on Pradaxa PTA (last dose 6/14 at 2110PM) now transitioned to IV heparin.   Heparin restarted yesterday, previously stopped d/t bleeding around PICC line, since removed.  HL therapeutic at 0.49 on heparin 1450 units/h however now has a little bit of gum bleeding started overnight. Bleeding has not increased this AM. Will decrease gtt slightly for now and reassess.    Goal of Therapy:  Heparin level 0.3-0.7 units/ml Monitor platelets by anticoagulation protocol: Yes   Plan:  Heparin at 1400 units/hr 1600 HL Daily heparin level and CBC Monitor s/sx bleeding   Hillery AldoElizabeth Chez Bulnes, Pharm.D., BCPS PGY2 Cardiology Pharmacy Resident Pager: [phone]  02/23/2016 8:07 AM

## 2016-02-23 NOTE — Progress Notes (Signed)
Nutrition Follow-up  DOCUMENTATION CODES:   Morbid obesity  INTERVENTION:    Continue Vital High Protein at goal rate of 45 ml/h (1080 ml per day) and Prostat 30 ml BID to provide 1280 kcals, 125 gm protein, 903 ml free water daily.  NUTRITION DIAGNOSIS:   Inadequate oral intake related to inability to eat as evidenced by NPO status.  Ongoing  GOAL:   Provide needs based on ASPEN/SCCM guidelines  Met  MONITOR:   Vent status, Labs, Weight trends, TF tolerance, Skin, I & O's  ASSESSMENT:   74 year old female who was admitted to [facility] on 02/24/2016 in the setting of dyspnea related to pulmonary edema. She ultimately required intubation on June 15 and was transferred to [facility].  Patient is currently intubated on ventilator support Temp (24hrs), Avg:98.4 F (36.9 C), Min:97.5 F (36.4 C), Max:99 F (37.2 C)  Patient is currently receiving Vital High Protein via OGT at 45 ml/h (1080 ml/day) and Prostat 30 ml BID to provide 1280 kcals, 125 gm protein, 903 ml free water daily. Tolerating well. Plans for one way extubation tomorrow morning.  Diet Order:  Diet NPO time specified  Skin:  Reviewed, no issues  Last BM:  6/21  Height:   Ht Readings from Last 1 Encounters:  02/16/16 5' 2"  (1.575 m)    Weight:   Wt Readings from Last 1 Encounters:  02/23/16 236 lb 1.8 oz (107.1 kg)    Ideal Body Weight:  50 kg  BMI:  Body mass index is 43.17 kg/(m^2).  Estimated Nutritional Needs:   Kcal:  9373-4287  Protein:  125 gm  Fluid:  >/= 1.5 L  EDUCATION NEEDS:   No education needs identified at this time  Molli Barrows, [place], [place], [place] Pager [phone] After Hours Pager [phone]

## 2016-02-23 NOTE — Progress Notes (Signed)
ANTICOAGULATION CONSULT NOTE - Follow Up Consult  Pharmacy Consult for Heparin Indication: atrial fibrillation  Allergies  Allergen Reactions  . Bee Venom Swelling  . Doxycycline     Hoarseness / throat irritation  . Latex Rash  . Penicillins Rash    Patient Measurements: Height: 5\' 2"  (157.5 cm) Weight: 236 lb 1.8 oz (107.1 kg) IBW/kg (Calculated) : 50.1 Heparin Dosing Weight: 76 kg  Vital Signs: Temp: 98.2 F (36.8 C) (06/22 1206) Temp Source: Oral (06/22 1206) BP: 91/74 mmHg (06/22 1300) Pulse Rate: 76 (06/22 1300)  Labs:  Recent Labs  02/21/16 0209  02/22/16 0230 02/22/16 1812 02/23/16 0327 02/23/16 1525  HGB 6.3*  < > 7.8* 7.5* 7.7*  --   HCT 21.2*  < > 25.6* 24.9* 25.2*  --   PLT 125*  < > 132* 133* 138*  --   HEPARINUNFRC  --   < >  --  0.42 0.49 0.43  CREATININE 2.70*  --  2.50*  --  2.18*  --   < > = values in this interval not displayed.  Estimated Creatinine Clearance: 26.5 mL/min (by C-G formula based on Cr of 2.18).   Assessment: 75 year old female with history of atrial fibrillation on Pradaxa PTA (last dose 6/14 at 2110PM) now transitioned to IV heparin.   Heparin restarted yesterday, previously stopped d/t bleeding around PICC line, since removed.  HL therapeutic at 0.49 on heparin 1450 units/h however now has a little bit of gum bleeding started overnight. Bleeding has not increased this AM. Will decrease gtt slightly for now and reassess.   PM HL = 0.43   Goal of Therapy:  Heparin level 0.3-0.7 units/ml Monitor platelets by anticoagulation protocol: Yes   Plan:  Heparin at 1400 units/hr Daily heparin level and CBC Monitor s/sx bleeding  Thank you Okey RegalLisa Lashona Schaaf, PharmD [phone]  02/23/2016 4:02 PM

## 2016-02-24 ENCOUNTER — Inpatient Hospital Stay (HOSPITAL_COMMUNITY): Payer: Medicare Other

## 2016-02-24 DIAGNOSIS — I509 Heart failure, unspecified: Secondary | ICD-10-CM | POA: Insufficient documentation

## 2016-02-24 DIAGNOSIS — I482 Chronic atrial fibrillation: Secondary | ICD-10-CM

## 2016-02-24 LAB — POCT I-STAT 3, ART BLOOD GAS (G3+)
Acid-Base Excess: 15 mmol/L — ABNORMAL HIGH (ref 0.0–2.0)
Bicarbonate: 37.2 mEq/L — ABNORMAL HIGH (ref 20.0–24.0)
O2 Saturation: 98 %
PCO2 ART: 36.7 mmHg (ref 35.0–45.0)
PH ART: 7.613 — AB (ref 7.350–7.450)
TCO2: 38 mmol/L (ref 0–100)
pO2, Arterial: 86 mmHg (ref 80.0–100.0)

## 2016-02-24 LAB — CBC
HEMATOCRIT: 25.6 % — AB (ref 36.0–46.0)
HEMOGLOBIN: 7.5 g/dL — AB (ref 12.0–15.0)
MCH: 26.1 pg (ref 26.0–34.0)
MCHC: 29.3 g/dL — ABNORMAL LOW (ref 30.0–36.0)
MCV: 89.2 fL (ref 78.0–100.0)
Platelets: 162 10*3/uL (ref 150–400)
RBC: 2.87 MIL/uL — AB (ref 3.87–5.11)
RDW: 22.8 % — ABNORMAL HIGH (ref 11.5–15.5)
WBC: 7.8 10*3/uL (ref 4.0–10.5)

## 2016-02-24 LAB — BASIC METABOLIC PANEL
ANION GAP: 9 (ref 5–15)
BUN: 128 mg/dL — ABNORMAL HIGH (ref 6–20)
CHLORIDE: 97 mmol/L — AB (ref 101–111)
CO2: 36 mmol/L — AB (ref 22–32)
CREATININE: 2.15 mg/dL — AB (ref 0.44–1.00)
Calcium: 8.8 mg/dL — ABNORMAL LOW (ref 8.9–10.3)
GFR calc non Af Amer: 22 mL/min — ABNORMAL LOW (ref >60)
GFR, EST AFRICAN AMERICAN: 25 mL/min — AB (ref >60)
Glucose, Bld: 121 mg/dL — ABNORMAL HIGH (ref 65–99)
POTASSIUM: 3.5 mmol/L (ref 3.5–5.1)
SODIUM: 142 mmol/L (ref 135–145)

## 2016-02-24 LAB — GLUCOSE, CAPILLARY
GLUCOSE-CAPILLARY: 117 mg/dL — AB (ref 65–99)
GLUCOSE-CAPILLARY: 313 mg/dL — AB (ref 65–99)
GLUCOSE-CAPILLARY: 113 mg/dL — AB (ref 65–99)
Glucose-Capillary: 121 mg/dL — ABNORMAL HIGH (ref 65–99)
Glucose-Capillary: 116 mg/dL — ABNORMAL HIGH (ref 65–99)
Glucose-Capillary: 138 mg/dL — ABNORMAL HIGH (ref 65–99)

## 2016-02-24 LAB — HEPARIN LEVEL (UNFRACTIONATED): Heparin Unfractionated: 0.46 IU/mL (ref 0.30–0.70)

## 2016-02-24 MED ORDER — ACETAZOLAMIDE 250 MG PO TABS
250.0000 mg | ORAL_TABLET | Freq: Once | ORAL | Status: AC
  Administered 2016-02-24: 250 mg
  Filled 2016-02-24: qty 1

## 2016-02-24 MED ORDER — POTASSIUM CHLORIDE 20 MEQ/15ML (10%) PO SOLN
40.0000 meq | Freq: Three times a day (TID) | ORAL | Status: AC
  Administered 2016-02-24 (×2): 40 meq
  Filled 2016-02-24: qty 30

## 2016-02-24 MED ORDER — FENTANYL BOLUS VIA INFUSION
25.0000 ug | INTRAVENOUS | Status: DC | PRN
  Filled 2016-02-24: qty 25

## 2016-02-24 MED ORDER — FENTANYL CITRATE (PF) 100 MCG/2ML IJ SOLN: 50.0000 ug | Freq: Once | INTRAMUSCULAR | Status: DC

## 2016-02-24 MED ORDER — ETOMIDATE 2 MG/ML IV SOLN
0.3000 mg/kg | Freq: Once | INTRAVENOUS | Status: AC
  Administered 2016-02-24: 20 mg via INTRAVENOUS

## 2016-02-24 MED ORDER — ACETAZOLAMIDE ORAL SUSPENSION 25 MG/ML: 250.0000 mg | Freq: Once | ORAL | Status: DC

## 2016-02-24 MED ORDER — MIDAZOLAM HCL 2 MG/2ML IJ SOLN
INTRAMUSCULAR | Status: AC
  Administered 2016-02-24: 2 mg
  Filled 2016-02-24: qty 2

## 2016-02-24 MED ORDER — MIDAZOLAM HCL 2 MG/2ML IJ SOLN
1.0000 mg | INTRAMUSCULAR | Status: DC | PRN
  Administered 2016-02-24 – 2016-02-27 (×10): 1 mg via INTRAVENOUS
  Filled 2016-02-24 (×9): qty 2

## 2016-02-24 MED ORDER — DOCUSATE SODIUM 50 MG/5ML PO LIQD: 100.0000 mg | Freq: Two times a day (BID) | ORAL | Status: DC | PRN

## 2016-02-24 MED ORDER — MIDAZOLAM HCL 2 MG/2ML IJ SOLN
1.0000 mg | INTRAMUSCULAR | Status: AC | PRN
  Administered 2016-02-25 (×3): 1 mg via INTRAVENOUS
  Filled 2016-02-24 (×4): qty 2

## 2016-02-24 MED ORDER — SODIUM CHLORIDE 0.9 % IV SOLN
25.0000 ug/h | INTRAVENOUS | Status: DC
  Administered 2016-02-24: 50 ug/h via INTRAVENOUS
  Administered 2016-02-25 – 2016-02-26 (×2): 100 ug/h via INTRAVENOUS
  Administered 2016-02-27: 200 ug/h via INTRAVENOUS
  Filled 2016-02-24 (×4): qty 50

## 2016-02-24 MED ORDER — FUROSEMIDE 10 MG/ML IJ SOLN
40.0000 mg | Freq: Four times a day (QID) | INTRAMUSCULAR | Status: DC
Start: 2016-02-24 — End: 2016-02-25
  Administered 2016-02-24 (×2): 40 mg via INTRAVENOUS
  Filled 2016-02-24 (×2): qty 4

## 2016-02-24 NOTE — Progress Notes (Signed)
RT responded to pt's room when Vent alarm was heard. Pt was disconnected from Vent and spo2 was 46%. Pt was manually ventilated for approximately 2 minutes. Pt placed back on ventilator with fio2 set at 100%. Vital signs within normal limits. RN aware. RT will continue to monitor.

## 2016-02-24 NOTE — Progress Notes (Signed)
ANTICOAGULATION CONSULT NOTE - Follow Up Consult  Pharmacy Consult for Heparin Indication: atrial fibrillation  Allergies  Allergen Reactions  . Bee Venom Swelling  . Doxycycline     Hoarseness / throat irritation  . Latex Rash  . Penicillins Rash    Patient Measurements: Height: 5\' 2"  (157.5 cm) Weight: 236 lb 12.4 oz (107.4 kg) IBW/kg (Calculated) : 50.1 Heparin Dosing Weight: 76 kg  Vital Signs: Temp: 98.4 F (36.9 C) (06/23 0750) Temp Source: Oral (06/23 0750) BP: 113/55 mmHg (06/23 0743) Pulse Rate: 77 (06/23 0743)  Labs:  Recent Labs  02/22/16 0230 02/22/16 1812 02/23/16 0327 02/23/16 1525 02/24/16 0213  HGB 7.8* 7.5* 7.7*  --  7.5*  HCT 25.6* 24.9* 25.2*  --  25.6*  PLT 132* 133* 138*  --  162  HEPARINUNFRC  --  0.42 0.49 0.43 0.46  CREATININE 2.50*  --  2.18*  --  2.15*    Estimated Creatinine Clearance: 26.9 mL/min (by C-G formula based on Cr of 2.15).   Assessment: 75 year old female with history of atrial fibrillation on Pradaxa PTA (last dose 6/14 at 2110PM) now transitioned to IV heparin.   Heparin restarted yesterday, previously stopped d/t bleeding around PICC line, since removed.  HL therapeutic at 0.46 on heparin 1400 units/h however now continues to have gum bleeding. Hgb 7.5, plt wnl. Discussed with Dr. Molli KnockYacoub, will hold heparin d/t continued bleeding for now.   Goal of Therapy:  Heparin level 0.3-0.7 units/ml Monitor platelets by anticoagulation protocol: Yes   Plan:  Hold heparin for 24h and restart 6/24 AM Daily heparin level and CBC Monitor s/sx bleeding   Hillery AldoElizabeth Ashraf Turner, Pharm.D., BCPS PGY2 Cardiology Pharmacy Resident Pager: [phone]  02/24/2016 8:32 AM

## 2016-02-24 NOTE — Progress Notes (Signed)
eLink Physician-Brief Progress Note Patient Name: Sandra AdasRita Cundari DOB: November 26, 1940 MRN: [id number]   Date of Service  02/24/2016  HPI/Events of Note  Camera check on patient with significant alkalosis. PH 7.61. PCO2 36.7. Ventilator rate set at 14 but patient with respiratory rate in the 20s. Nose reports patient is complaining of some discomfort. Patient currently on Lasix infusion. Serum bicarbonate 36.   eICU Interventions  1. Administering when necessary dose of fentanyl 2. Diamox 250 mg via tube 1      Intervention Category Major Interventions: Acid-Base disturbance - evaluation and management  Lawanda CousinsJennings Aarti Mankowski 02/24/2016, 4:53 AM

## 2016-02-24 NOTE — Care Management Note (Signed)
Case Management Note  Patient Details  Name: Sandra AdasRita Gallop MRN: [id number] Date of Birth: Apr 25, 1941  Subjective/Objective:    [facility] on ventialtor, sedation, tube feeds.                Pt admitted with AKI. Pt lives alone and says she has a sister and neighbors who provide support. Pt is ind with ADL's and has a walker she uses as needed for ambulation. Pt has home O2 and neb, she uses 3.5L at baseline. [facility], anticipate pt will need HH services, possible SNF.   Action/Plan: Will need PT eval when appropriate, will cont to follow for DC planning.   Expected Discharge Date:     02/18/2016             Expected Discharge Plan:  Home w Home Health Services  In-House Referral:  NA  Discharge planning Services  CM Consult  Post Acute Care Choice:  Home Health Choice offered to:     DME Arranged:    DME Agency:     HH Arranged:    HH Agency:     Status of Service:  In process, will continue to follow  Medicare Important Message Given:  Yes Date Medicare IM Given:    Medicare IM give by:    Date Additional Medicare IM Given:    Additional Medicare Important Message give by:     If discussed at Long Length of Stay Meetings, dates discussed:    Additional Comments: 02/24/2016 Pt remains on ventilator.  IV heparin had to be stopped due to oozing blood .  Cherylann ParrClaxton, Deandra Goering S, RN 02/24/2016, 11:53 AM

## 2016-02-24 NOTE — Progress Notes (Signed)
   [facility] PCCM  Pt self extubated and was re-intubated 2/2 resp distress. She said she did NOT want a trache or PEG.  I told her she needs a trache.  Suggest hold off on weaning this weekend.  Need to discuss with her re: need for trache again next week.  Keep sedated. Will switch to fentanyl drip and versed pushes.   Pollie MeyerJ. Angelo A de Dios, MD 02/24/2016, 5:38 PM [facility] Pager [phone] After 3 pm or if no answer, call [phone]

## 2016-02-24 NOTE — Progress Notes (Signed)
Self extubated. reintubated at patient request.

## 2016-02-24 NOTE — Progress Notes (Signed)
PULMONARY / CRITICAL CARE MEDICINE   Name: Sandra AdasRita Nesbit MRN: [id number] DOB: 01/06/1941    ADMISSION DATE:  02/18/2016 CONSULTATION DATE:  02/16/2016  REFERRING MD:  Christy GentlesEstela Acosta, M.D. / [facility]  CHIEF COMPLAINT:  Shortness of breath  HISTORY OF PRESENT ILLNESS:  75 year old female well known to the  pulmonary and critical care service who comes [facility] on 02/21/2016 in the setting of dyspnea. She was admitted that day and was noted to have acute kidney injury and a low hemoglobin value. She was admitted by the hospitalist service and nephrology was consulted. She was administered packed red blood cells and Lasix was given as well. Despite these measures she had ongoing respiratory distress with hypoxemia. She ultimately required intubation on June 15. Pulmonary and critical care medicine in [place] was asked to take the patient on transfer because of no pulmonary and critical care support at [facility].  SUBJECTIVE:  Unable to wean this AM.  Bleeding from oral cavity and arm overnight.  VITAL SIGNS: BP 115/45 mmHg  Pulse 81  Temp(Src) 98.4 F (36.9 C) (Oral)  Resp 22  Ht 5\' 2"  (1.575 m)  Wt 107.4 kg (236 lb 12.4 oz)  BMI 43.30 kg/m2  SpO2 94%  HEMODYNAMICS:    VENTILATOR SETTINGS: Vent Mode:  [-] PRVC FiO2 (%):  [40 %] 40 % Set Rate:  [14 bmp] 14 bmp Vt Set:  [440 mL] 440 mL PEEP:  [5 cmH20] 5 cmH20 Pressure Support:  [10 cmH20] 10 cmH20 Plateau Pressure:  [24 cmH20-37 cmH20] 37 cmH20  INTAKE / OUTPUT:  Intake/Output Summary (Last 24 hours) at 02/24/16 0905 Last data filed at 02/24/16 0830  Gross per 24 hour  Intake   1941 ml  Output   3400 ml  Net  -1459 ml   PHYSICAL EXAMINATION: General: Respiratory distress when started on wean this AM. Integument: Warm & dry. Pressure dsg/ace wrap to right upper arm, with extensive ecchymosis to lower arm. Weeping to bilateral upper extremities. Scaling dermis in lower extremities.  HEENT:  Endotracheal tube in place, supple, no JVD Cardiovascular: irregular rhythm regular rate, Anasarca, +mumur Pulmonary: Wheezing throughout, diminished bibasilarly. Symmetric chest wall rise on ventilator Abdomen: Soft. Normal bowel sounds x 4. Protuberant/obese Musculoskeletal: Normal bulk. No joint deformity or effusion appreciated Neurological: PERRL, follows commands, generalized weakness, MAE  LABS:  BMET  Recent Labs Lab 02/22/16 0230 02/23/16 0327 02/24/16 0213  NA 140 140 142  K 3.5 3.6 3.5  CL 95* 95* 97*  CO2 33* 33* 36*  BUN 119* 125* 128*  CREATININE 2.50* 2.18* 2.15*  GLUCOSE 122* 108* 121*   Electrolytes  Recent Labs Lab 02/21/16 0209 02/22/16 0230 02/23/16 0327 02/24/16 0213  CALCIUM 7.9* 8.6* 8.7* 8.8*  MG 2.3  --  2.5*  --   PHOS 4.5  --  4.5  --    CBC  Recent Labs Lab 02/22/16 1812 02/23/16 0327 02/24/16 0213  WBC 8.2 7.7 7.8  HGB 7.5* 7.7* 7.5*  HCT 24.9* 25.2* 25.6*  PLT 133* 138* 162   Coag's No results for input(s): APTT, INR in the last 168 hours.  Sepsis Markers  Recent Labs Lab 02/18/16 0416 02/20/16 0340  PROCALCITON 0.38 0.72   ABG  Recent Labs Lab 02/21/16 0400 02/23/16 0442 02/24/16 0353  PHART 7.469* 7.496* 7.613*  PCO2ART 49.8* 48.6* 36.7  PO2ART 90.5 66.0* 86.0   Liver Enzymes No results for input(s): AST, ALT, ALKPHOS, BILITOT, ALBUMIN in the last 168 hours.  Cardiac Enzymes  Recent Labs Lab 02/17/16 1300  TROPONINI 0.04*   Glucose  Recent Labs Lab 02/23/16 1609 02/23/16 2015 02/23/16 2325 02/24/16 0406 02/24/16 0745 02/24/16 0751  GLUCAP 117* 119* 117* 121* 116* 313*   Imaging Dg Chest Port 1 View  02/24/2016  CLINICAL DATA:  Respiratory failure. EXAM: PORTABLE CHEST 1 VIEW COMPARISON:  02/23/2016. FINDINGS: Endotracheal tube and NG tube in stable position. Prior cardiac valve replacement. Left atrial appendage clip in stable position. Cardiomegaly again noted. Diffuse bilateral  pulmonary infiltrates/pulmonary edema again noted. Bilateral pleural effusions noted. No interim change. Degenerative changes both shoulders. IMPRESSION: 1.  Lines and tubes in stable position. 2. Prior cardiac valve replacement. Cardiomegaly with diffuse bilateral pulmonary infiltrates/pulmonary edema and bilateral pleural effusions again noted. No interim change. Electronically Signed   By: Maisie Fushomas  Register   On: 02/24/2016 07:39   STUDIES:  Echo (03/16/15): LV EF: 55%-60% TTE (03/16/15): LV EF 55-60, mildly calcified mitral valve, bioprosthetic aortic valve functioning well but poorly visualized, right ventricle moderately dilated, RVSP estimate 44 mmHg, right atrium severely dilated, mild to moderate tricuspid regurgitation PFT (04/15/15): FVC 0.96 L (36%) FEV1 0.74 L (36%) FEV1/FVC 0.76 no bronchodilator response TLC 2.90 L (61%) DLCO uncorrected 40% CXR PA/LAT 6/11: Silhouetting of bilateral hemidiaphragms with lower lobe opacification suggestive of pleural effusions. PORT CXR 6/15: Patchy bilateral alveolar opacities now involving the upper lung zones. Improved visualization of hemidiaphragms bilaterally. Endotracheal tube & orogastric tube in good position. Right PICC line with tip in SVC. Blunting of right costocardiac angle suggestive of pleural effusion. CT Abdomen Pelvis 6/16: Moderate right and small left pleural effusions, with patchy bibasilar airspace opacities, which may reflect pneumonia or pulmonary edema. Small amount of ascites within the abdomen and pelvis, some of which is high in attenuation, of uncertain significance. Vague nonspecific soft tissue inflammation about the pancreas may simply reflect underlying edema. Would correlate with the patient's symptoms. Diffuse soft tissue edema tracking about the abdomen and pelvis, concerning for anasarca. Scattered diverticulosis along the sigmoid colon without evidence of diverticulitis. Mild cardiomegaly. Diffuse coronary  artery calcifications seen.  MICROBIOLOGY: MRSA PCR 6/11:  Positive Blood Ctx x2 6/15 >> negative Tracheal Asp Ctx 6/15 >> negative Urine Strep Ag 6/15:  Negative   ANTIBIOTICS: Aztreonam 6/15 >> 6/19 Vancomycin 6/15 >> 6/22 Cefepime 6/19>> 6/22  SIGNIFICANT EVENTS: 6/11 - Admission to [facility] 6/11 - Transfused 2u PRBC 6/15 - Transfused 3u PRBC 6/15 - Intubation for respiratory failure and [facility] 6/17 - started on lasix gtt.  6/18 - weaning, CXR a little better. Had family meeting. Goals of care discussed. Made DNR.  6/20 - heparin gtt held with bleeding from prior picc line site 6/21 - restarted heparin, weaned 8 hours on PSV  LINES/TUBES: OETT 7.5 6/15 >> OGT 6/15 >> FOLEY 6/11>> R TL PICC 6/15 >> 6/19 PIV x2  DISCUSSION:  75 year old female with a past medical history significant for aortic valve replacement in 2016 complicated by recurrent episodes of respiratory failure with hypoxemia due to mucus plugging and underlying severe asthma who returned to the hospital on 02/16/2016 in the setting of acute kidney injury, anemia, and acute respiratory failure with hypoxemia. She has a recent history of recurrent episodes of diastolic heart failure requiring multiple hospitalizations.  Working dx at this point is decompensated diastolic HF c/b ALI which was either the result of TRALI or HCAP (NOS). She has been on abx, we added lasix gtt and she is making some  improvement both clinically and radiographically. At this point the plan will be to cont IV abx, cont lasix gtt and re-assess for extubation daily. Have discussed w/ family and made her DNR. Would favor one-way extubation and will need to verify this prior to extubation. In the best case scenario looking at hospice after.   ASSESSMENT / PLAN:  PULMONARY A: Acute Respiratory Failure with Hypoxia - Volume overload vs TRALI vs HCAP. Possible ARDS, clearly ARDS physiology Possible HCAP H/O Asthma H/O  Tracheomalacia  P:   PRVC 8 cc/kg Wean PEEP / FiO2 for sats > 92% Daily SBT & WUA Trend CXR Xopenex & Atrovent Nebs q6hr Lasix as BP/BUN/creatinine allow  Spoke with patient, she was alert and interactive, seemed appropriate, questioned about trach/peg and she stated no.  Will continue diureses and when ready plan would be to extubate with no intention to reintubate (today is day 9).  CARDIOVASCULAR A:  Chronic Diastolic CHF H/O Atrial fibrillation - S/P MAZE 2016 H/O Aortic Stenosis - S/P bioprosthetic aortic valve.  P:  Telemetry monitoring  Vitals per unit protocol Hold Pradaxa for anemia, will restart when able given AVR Heparin gtt for now per pharmacy protocol >> resume 6/21 Decrease Diltizem to 30 mg VT q8hr D/C lasix drip.  RENAL A:   Acute on Chronic Renal Failure - Improving with diuresis (baseline creatinine appears to be in to 2 to 2.3 range) Borderline hyperkalemia  Metabolic Akalosis  P:   Trend UOP   Monitoring electrolytes & renal function daily Replace electrolytes as needed Lasix change to 40 mg IV q6 x3 doses. D/C schedule K, ordered 40 BID.  GASTROINTESTINAL A:   Severe protein calorie malnutrition   P:   Pepcid IV q12hr Cont tube feeds  HEMATOLOGIC A:   Chronic Anticoagulation - Pradaxa for A fib. Anemia - No obvious bleeding source. S/P 2u PRBC 6/11 & 3u PRBC 6/15.  hgb drifting. Got 1 unit PRBC 6/18 Thrombocytopenia  P:  Trend daily CBC SCDs Transfuse for Hgb <7.0  Ferrous sulfate PT TID  INFECTIOUS A:   HCAP P:   Empiric antibiotics, transition to vanc/cefepime (took it last year without difficulty) and have stop date at 6/22. Monitor fever curve / WBC trend  ENDOCRINE A:   Hyperglycemia - Mild. P:   Monitor glucose on daily labs.  NEUROLOGIC A:   Sedation on Ventilator. P:   RASS goal:  0   PRN sedation only   FAMILY:  - Updates:  Spoke with patient, she is alert and oriented, she elected no trach/peg, will notify  family, in the meantime, continue diureses and once closer to 2 wks or patient is ready then one way extubation.  - Inter-disciplinary family meet or Palliative Care meeting due by:  6/22  - GOC: Family discussion on 6/21.  Her brother is going to discuss with his sister regarding what they feel the patient would want in this situation and get back to [place].   The patient is critically ill with multiple organ systems failure and requires high complexity decision making for assessment and support, frequent evaluation and titration of therapies, application of advanced monitoring technologies and extensive interpretation of multiple databases.   Critical Care Time devoted to patient care services described in this note is 35 Minutes. This time reflects time of care of this signee Dr Koren Bound. This critical care time does not reflect procedure time, or teaching time or supervisory time of PA/NP/Med student/Med Resident etc but could involve care discussion  time.  Alyson Reedy, M.D. [facility] Pulmonary/Critical Care Medicine. Pager: [phone]. After hours pager: [phone]. 75 year old female with MODS, fluid overloaded.

## 2016-02-24 NOTE — Procedures (Signed)
Intubation Procedure Note Sandra Turner [id number] [birth year]  Procedure: Intubation Indications: Respiratory insufficiency  Procedure Details Consent: Risks of procedure as well as the alternatives and risks of each were explained to the (patient/caregiver).  Consent for procedure obtained. Time Out: Verified patient identification, verified procedure, site/side was marked, verified correct patient position, special equipment/implants available, medications/allergies/relevent history reviewed, required imaging and test results available.  Performed  Maximum sterile technique was used including gloves, gown, hand hygiene and mask.  MAC and 3    Evaluation Hemodynamic Status: BP stable throughout; O2 sats: stable throughout Patient's Current Condition: stable Complications: No apparent complications Patient did tolerate procedure well. Chest X-ray ordered to verify placement.  CXR: pending.  Pt self extubated at 1705 and was placed on 5L [name]. Pt was questioned by MD if she wanted to be re-intubated as she has already been intubated x11 days but stated today she does not wish to have a trach or peg. Pt agreed to be re-intubated today. Pt intubated using glidescope # blade with 7.5 ett on 1st attempt secured with commercial tube holder at 23 at the lip. Positive color change on etco2, direct visualization, bilateral BS, CXR pending. RT will continue to monitor.    Sandra Turner, Sandra Turner M 02/24/2016

## 2016-02-24 NOTE — Procedures (Signed)
Intubation Procedure Note Sandra Turner [id number] 10/05/1940  Procedure: Intubation Indications: Respiratory insufficiency  Pt self extubated.  She said she wants to be re intubated. Noted to have inc WOB with RR in 30-40s.   Procedure Details Consent: Risks of procedure as well as the alternatives and risks of each were explained to the (patient/caregiver).  Consent for procedure obtained. Time Out: Verified patient identification, verified procedure, site/side was marked, verified correct patient position, special equipment/implants available, medications/allergies/relevent history reviewed, required imaging and test results available.  Performed  Maximum sterile technique was used including cap, gloves, gown, hand hygiene and mask.  MAC and 3  With gleidoscope use, I was able to visualize the vocal cords. Vocal cords were swollen. ET tube, size 7-1/2, inserted without difficulty. Good color change. Breath sounds equal.  Patient ended up receiving 2 mg of IV Versed, 100 g of IV fentanyl, and 20 mg of etomidate IV.   Evaluation Hemodynamic Status: BP stable throughout; O2 sats: stable throughout Patient's Current Condition: stable Complications: No apparent complications Patient did tolerate procedure well. Chest X-ray ordered to verify placement.  CXR: pending.   Sandra Turner 02/24/2016

## 2016-02-24 NOTE — Progress Notes (Signed)
   02/24/16 1700  Clinical Encounter Type  Visited With Health care provider  Visit Type Follow-up  Referral From Nurse  Consult/Referral To Chaplain  Spiritual Encounters  Spiritual Needs Emotional  Stress Factors  Patient Stress Factors None identified  Family Stress Factors None identified   Chaplain responded to call from nurse secretary for pt. When Chaplain arrived, pt had extubated herself.  Chaplain not needed at this time.  Rosezella FloridaLisa M Carla Whilden 02/24/2016 5:09 PM

## 2016-02-25 LAB — CBC
HEMATOCRIT: 24.9 % — AB (ref 36.0–46.0)
HEMOGLOBIN: 7.2 g/dL — AB (ref 12.0–15.0)
MCH: 26.6 pg (ref 26.0–34.0)
MCHC: 28.9 g/dL — ABNORMAL LOW (ref 30.0–36.0)
MCV: 91.9 fL (ref 78.0–100.0)
Platelets: 161 10*3/uL (ref 150–400)
RBC: 2.71 MIL/uL — AB (ref 3.87–5.11)
RDW: 22.8 % — ABNORMAL HIGH (ref 11.5–15.5)
WBC: 6.1 10*3/uL (ref 4.0–10.5)

## 2016-02-25 LAB — BASIC METABOLIC PANEL
ANION GAP: 14 (ref 5–15)
BUN: 131 mg/dL — ABNORMAL HIGH (ref 6–20)
CHLORIDE: 99 mmol/L — AB (ref 101–111)
CO2: 30 mmol/L (ref 22–32)
Calcium: 8.9 mg/dL (ref 8.9–10.3)
Creatinine, Ser: 1.9 mg/dL — ABNORMAL HIGH (ref 0.44–1.00)
GFR calc Af Amer: 29 mL/min — ABNORMAL LOW (ref >60)
GFR, EST NON AFRICAN AMERICAN: 25 mL/min — AB (ref >60)
Glucose, Bld: 81 mg/dL (ref 65–99)
POTASSIUM: 4.6 mmol/L (ref 3.5–5.1)
SODIUM: 143 mmol/L (ref 135–145)

## 2016-02-25 LAB — MAGNESIUM: MAGNESIUM: 2.7 mg/dL — AB (ref 1.7–2.4)

## 2016-02-25 LAB — GLUCOSE, CAPILLARY
GLUCOSE-CAPILLARY: 101 mg/dL — AB (ref 65–99)
GLUCOSE-CAPILLARY: 113 mg/dL — AB (ref 65–99)
GLUCOSE-CAPILLARY: 121 mg/dL — AB (ref 65–99)
Glucose-Capillary: 97 mg/dL (ref 65–99)
Glucose-Capillary: 109 mg/dL — ABNORMAL HIGH (ref 65–99)
Glucose-Capillary: 117 mg/dL — ABNORMAL HIGH (ref 65–99)
Glucose-Capillary: 113 mg/dL — ABNORMAL HIGH (ref 65–99)

## 2016-02-25 LAB — PHOSPHORUS: PHOSPHORUS: 4.5 mg/dL (ref 2.5–4.6)

## 2016-02-25 LAB — HEPARIN LEVEL (UNFRACTIONATED)

## 2016-02-25 NOTE — Progress Notes (Signed)
PULMONARY / CRITICAL CARE MEDICINE   Name: Kelli Robeck MRN: [id number] DOB: [birth year]    ADMISSION DATE:  10-Mar-2016 CONSULTATION DATE:  02/16/2016  REFERRING MD:  Christy Gentles, M.D. / [facility]  CHIEF COMPLAINT:  Shortness of breath  HISTORY OF PRESENT ILLNESS:  75 year old female well known to the [place] pulmonary and critical care service who comes to [facility] on 03-10-2016 in the setting of dyspnea. She was admitted that day and was noted to have acute kidney injury and a low hemoglobin value. She was admitted by the hospitalist service and nephrology was consulted. She was administered packed red blood cells and Lasix was given as well. Despite these measures she had ongoing respiratory distress with hypoxemia. She ultimately required intubation on June 15. Pulmonary and critical care medicine in [place] was asked to take the patient on transfer because of no pulmonary and critical care support at [facility].  SUBJECTIVE:   Ongoing small amt oral bleeding / subglottic suction.  Pt self extubated overnight.  Requested no trach / PEG.  Reintubated.   VITAL SIGNS: BP 97/43 mmHg  Pulse 76  Temp(Src) 97.4 F (36.3 C) (Oral)  Resp 18  Ht  (1.575 m)  Wt 227 lb 8.2 oz (103.2 kg)  BMI 41.60 kg/m2  SpO2 92%  HEMODYNAMICS:    VENTILATOR SETTINGS: Vent Mode:  [-] PRVC FiO2 (%):  [40 %-100 %] 40 % Set Rate:  [15 bmp] 15 bmp Vt Set:  [440 mL] 440 mL PEEP:  [5 cmH20] 5 cmH20 Plateau Pressure:  [16 cmH20-25 cmH20] 25 cmH20  INTAKE / OUTPUT:  Intake/Output Summary (Last 24 hours) at 02/25/16 0815 Last data filed at 02/25/16 0600  Gross per 24 hour  Intake 893.17 ml  Output   1375 ml  Net -481.83 ml   PHYSICAL EXAMINATION: General: chronically ill appearing female in NAD on vent Integument: Warm & dry. bilateral upper ext's with ecchymosis, multiple dressings, Scaling dermis in lower extremities.  HEENT: Endotracheal tube in place, supple, no  JVD Cardiovascular: irregular rhythm regular rate, Anasarca, +mumur Pulmonary: Wheezing throughout, diminished bibasilarly. Symmetric chest wall rise on ventilator Abdomen: Soft. Normal bowel sounds x 4. Protuberant/obese Musculoskeletal: Normal bulk. No joint deformity or effusion appreciated Neurological: PERRL, follows commands, generalized weakness, MAE  LABS:  BMET  Recent Labs Lab 02/23/16 0327 02/24/16 0213 02/25/16 0354  NA 140 142 143  K 3.6 3.5 4.6  CL 95* 97* 99*  CO2 33* 36* 30  BUN 125* 128* 131*  CREATININE 2.18* 2.15* 1.90*  GLUCOSE 108* 121* 81   Electrolytes  Recent Labs Lab 02/21/16 0209  02/23/16 0327 02/24/16 0213 02/25/16 0354  CALCIUM 7.9*  < > 8.7* 8.8* 8.9  MG 2.3  --  2.5*  --  2.7*  PHOS 4.5  --  4.5  --  4.5  < > = values in this interval not displayed. CBC  Recent Labs Lab 02/23/16 0327 02/24/16 0213 02/25/16 0354  WBC 7.7 7.8 6.1  HGB 7.7* 7.5* 7.2*  HCT 25.2* 25.6* 24.9*  PLT 138* 162 161   Coag's No results for input(s): APTT, INR in the last 168 hours.  Sepsis Markers  Recent Labs Lab 02/20/16 0340  PROCALCITON 0.72   ABG  Recent Labs Lab 02/21/16 0400 02/23/16 0442 02/24/16 0353  PHART 7.469* 7.496* 7.613*  PCO2ART 49.8* 48.6* 36.7  PO2ART 90.5 66.0* 86.0   Liver Enzymes No results for input(s): AST, ALT, ALKPHOS, BILITOT, ALBUMIN in the last 168  hours.  Cardiac Enzymes No results for input(s): TROPONINI, PROBNP in the last 168 hours. Glucose  Recent Labs Lab 02/24/16 0751 02/24/16 1205 02/24/16 1540 02/24/16 2043 02/25/16 0027 02/25/16 0354  GLUCAP 313* 138* 121* 113* 113* 97   Imaging Dg Chest Port 1 View  02/24/2016  CLINICAL DATA:  Post adjustment of ET tube EXAM: PORTABLE CHEST 1 VIEW COMPARISON:  02/24/2016 FINDINGS: Cardiomediastinal silhouette is stable. Again noted status post aortic valve replacement. Bilateral diffuse infiltrates or edema again noted. Stable small right pleural  effusion. NG tube is unchanged in position. Endotracheal tube in place with tip 4 cm above the carina. There is no pneumothorax. IMPRESSION: Again noted status post aortic valve replacement. Bilateral diffuse infiltrates or edema again noted. Stable small right pleural effusion. NG tube is unchanged in position. Endotracheal tube in place with tip 4 cm above the carina. There is no pneumothorax. Electronically Signed   By: Natasha MeadLiviu  Pop M.D.   On: 02/24/2016 20:45   Dg Chest Port 1 View  02/24/2016  CLINICAL DATA:  OG tube placement EXAM: PORTABLE CHEST 1 VIEW COMPARISON:  02/24/2016 FINDINGS: NG tube is in place with the tip in the mid stomach. Endotracheal tube is in stable position. Continued diffuse bilateral airspace disease within the visualized lungs. Cardiomegaly. Prior median sternotomy and valve replacement. IMPRESSION: NG tube tip in the mid stomach. Continued diffuse bilateral airspace disease in the visualized lungs, similar to prior study. Electronically Signed   By: Charlett NoseKevin  Dover M.D.   On: 02/24/2016 10:56   Dg Abd Portable 1v  02/24/2016  CLINICAL DATA:  Post NG tube placement EXAM: PORTABLE ABDOMEN - 1 VIEW COMPARISON:  None. FINDINGS: OG tube extends the stomach.  No bowel obstruction evident. IMPRESSION: OG tube in stomach. Electronically Signed   By: Genevive BiStewart  Edmunds M.D.   On: 02/24/2016 21:24    STUDIES:  Echo (03/16/15): LV EF: 55%-60% TTE (03/16/15): LV EF 55-60, mildly calcified mitral valve, bioprosthetic aortic valve functioning well but poorly visualized, right ventricle moderately dilated, RVSP estimate 44 mmHg, right atrium severely dilated, mild to moderate tricuspid regurgitation PFT (04/15/15): FVC 0.96 L (36%) FEV1 0.74 L (36%) FEV1/FVC 0.76 no bronchodilator response TLC 2.90 L (61%) DLCO uncorrected 40% CXR PA/LAT 6/11: Silhouetting of bilateral hemidiaphragms with lower lobe opacification suggestive of pleural effusions. PORT CXR 6/15: Patchy bilateral alveolar opacities  now involving the upper lung zones. Improved visualization of hemidiaphragms bilaterally. Endotracheal tube & orogastric tube in good position. Right PICC line with tip in SVC. Blunting of right costocardiac angle suggestive of pleural effusion. CT Abdomen Pelvis 6/16: Moderate right and small left pleural effusions, with patchy bibasilar airspace opacities, which may reflect pneumonia or pulmonary edema. Small amount of ascites within the abdomen and pelvis, some of which is high in attenuation, of uncertain significance. Vague nonspecific soft tissue inflammation about the pancreas may simply reflect underlying edema. Would correlate with the patient's symptoms. Diffuse soft tissue edema tracking about the abdomen and pelvis, concerning for anasarca. Scattered diverticulosis along the sigmoid colon without evidence of diverticulitis. Mild cardiomegaly. Diffuse coronary artery calcifications seen.  MICROBIOLOGY: MRSA PCR 6/11:  Positive Blood Ctx x2 6/15 >> negative Tracheal Asp Ctx 6/15 >> negative Urine Strep Ag 6/15:  Negative   ANTIBIOTICS: Aztreonam 6/15 >> 6/19 Vancomycin 6/15 >> 6/22 Cefepime 6/19>> 6/22  SIGNIFICANT EVENTS: 6/11 - Admission to [facility] 6/11 - Transfused 2u PRBC 6/15 - Transfused 3u PRBC 6/15 - Intubation for respiratory failure [facility] - started on lasix gtt.  6/18 - weaning, CXR a little better. Had family meeting. Goals of care discussed. Made DNR.  6/20 - heparin gtt held with bleeding from prior picc line site 6/21 - restarted heparin, weaned 8 hours on PSV 6/23 - Self extubated, requested no trach  LINES/TUBES: OETT 7.5 6/15 >> 6/23, 6/23 >>  OGT 6/15 >> FOLEY 6/11>> R TL PICC 6/15 >> 6/19 PIV x2  DISCUSSION:  75 year old female with a past medical history significant for aortic valve replacement in 2016 complicated by recurrent episodes of respiratory failure with hypoxemia due to mucus plugging and underlying severe  asthma who returned to the hospital on 02/16/2016 in the setting of acute kidney injury, anemia, and acute respiratory failure with hypoxemia. She has a recent history of recurrent episodes of diastolic heart failure requiring multiple hospitalizations.  Working dx at this point is decompensated diastolic HF c/b ALI which was either the result of TRALI or HCAP (NOS). She has been on abx, with addition of lasix gtt she is making some improvement both clinically and radiographically. At this point the plan will re-assess for extubation daily. Have discussed w/ family and made her DNR. Pt requested no trach / PEG.    ASSESSMENT / PLAN:  PULMONARY A: Acute Respiratory Failure with Hypoxia - Volume overload vs TRALI vs HCAP. Possible ARDS, clearly ARDS physiology Possible HCAP H/O Asthma H/O Tracheomalacia  P:   PRVC 8 cc/kg Wean PEEP / FiO2 for sats > 92% Daily SBT & WUA Trend CXR Xopenex & Atrovent Nebs q6hr Spoke with patient, she was alert and interactive, seemed appropriate, questioned about trach/peg and she stated no.  Reassess daily for extubation with no intention to reintubate   CARDIOVASCULAR A:  Chronic Diastolic CHF H/O Atrial fibrillation - S/P MAZE 2016 H/O Aortic Stenosis - S/P bioprosthetic aortic valve. DNR  P:  Telemetry monitoring  Vitals per unit protocol Hold Pradaxa for anemia, will restart when able given AVR Heparin gtt for now per pharmacy protocol >> consider resume 6/25 Decrease Diltizem to 30 mg VT q8hr DNR  RENAL A:   Acute on Chronic Renal Failure - Improving with diuresis (baseline creatinine appears to be in to 2 to 2.3 range) Borderline hyperkalemia  Metabolic Akalosis - contraction alkalosis   P:   Trend UOP   Monitor electrolytes & renal function daily Replace electrolytes as needed Hold further lasix  GASTROINTESTINAL A:   Severe protein calorie malnutrition   P:   Pepcid IV q12hr Cont tube feeds  HEMATOLOGIC A:   Chronic  Anticoagulation - Pradaxa for A fib. Anemia - No obvious bleeding source. S/P 2u PRBC 6/11 & 3u PRBC 6/15.  hgb drifting. Got 1 unit PRBC 6/18 Thrombocytopenia  P:  Trend daily CBC SCDs Transfuse for Hgb <7.0  Ferrous sulfate PT TID  INFECTIOUS A:   HCAP P:   Completed abx as above Monitor fever curve / WBC trend  ENDOCRINE A:   Hyperglycemia - Mild. P:   Monitor glucose on daily labs.  NEUROLOGIC A:   Sedation on Ventilator. P:   RASS goal:  0   Fentanyl gtt with PRN versed  FAMILY:  - Updates:  Spoke with patient, she is alert and oriented, she elected no trach/peg, will notify family, in the meantime, continue diureses and once closer to 2 wks or patient is ready then one way extubation.  - Inter-disciplinary family meet or Palliative Care meeting due by:  6/22   Merry Proud  Veleta Minersllis, NP-C [place] [facility] Pgr: [phone] or if no answer [phone] 02/25/2016, 8:24 AM  Attending Note:  I have examined patient, reviewed labs, studies and notes. I have discussed the case with B Ollis, and I agree with the data and plans as amended above. 75 year old woman admitted with prosthetic aortic valve, recurrent acute on chronic respiratory failure in the setting of healthcare associated pneumonia, MODS, acute on chronic diastolic CHF and massive volume overload. She also has a history of reported asthma. She self extubated last night and was successfully reintubated. On evaluation she sedated, intubated. She has bilateral coarse breath sounds. Multiple scattered ecchymoses and skin tears. Massive generalized edema. We will continue her antibiotics, aggressive diuresis. Assess her for spontaneous breathing. She has indicated that she would not want to be chronically ventilated, would not want tracheostomy. Independent critical care time is 35 minutes.   Levy Pupaobert Armstrong Creasy, MD, PhD 02/25/2016, 10:22 AM [place] [facility] [phone] or if no answer [phone]

## 2016-02-25 NOTE — Progress Notes (Signed)
Pt is on Pradaxa PTA for hx of AFib and bioprosthetic aortic valve. She was transitioned to heparin gtt while intubated. This has been held since yesterday at 0830. Pt continues to have bleeding from gums/oropharynx area. Discussed with CCM, will continue to hold heparin and will reassess tomorrow AM.   Baldemar FridayMasters, Ophelia Sipe M  02/25/2016 10:45 AM

## 2016-02-26 ENCOUNTER — Inpatient Hospital Stay (HOSPITAL_COMMUNITY): Payer: Medicare Other

## 2016-02-26 LAB — GLUCOSE, CAPILLARY
GLUCOSE-CAPILLARY: 131 mg/dL — AB (ref 65–99)
GLUCOSE-CAPILLARY: 118 mg/dL — AB (ref 65–99)
Glucose-Capillary: 117 mg/dL — ABNORMAL HIGH (ref 65–99)
Glucose-Capillary: 117 mg/dL — ABNORMAL HIGH (ref 65–99)
Glucose-Capillary: 128 mg/dL — ABNORMAL HIGH (ref 65–99)
Glucose-Capillary: 129 mg/dL — ABNORMAL HIGH (ref 65–99)

## 2016-02-26 LAB — BASIC METABOLIC PANEL
Anion gap: 9 (ref 5–15)
BUN: 138 mg/dL — AB (ref 6–20)
CO2: 37 mmol/L — ABNORMAL HIGH (ref 22–32)
CREATININE: 2.03 mg/dL — AB (ref 0.44–1.00)
Calcium: 9 mg/dL (ref 8.9–10.3)
Chloride: 98 mmol/L — ABNORMAL LOW (ref 101–111)
GFR calc Af Amer: 27 mL/min — ABNORMAL LOW (ref >60)
GFR, EST NON AFRICAN AMERICAN: 23 mL/min — AB (ref >60)
Glucose, Bld: 120 mg/dL — ABNORMAL HIGH (ref 65–99)
Potassium: 4 mmol/L (ref 3.5–5.1)
SODIUM: 144 mmol/L (ref 135–145)

## 2016-02-26 LAB — CBC
HCT: 25.9 % — ABNORMAL LOW (ref 36.0–46.0)
Hemoglobin: 7.3 g/dL — ABNORMAL LOW (ref 12.0–15.0)
MCH: 26.7 pg (ref 26.0–34.0)
MCHC: 28.2 g/dL — AB (ref 30.0–36.0)
MCV: 94.9 fL (ref 78.0–100.0)
PLATELETS: 174 10*3/uL (ref 150–400)
RBC: 2.73 MIL/uL — ABNORMAL LOW (ref 3.87–5.11)
RDW: 22.9 % — AB (ref 11.5–15.5)
WBC: 6.2 10*3/uL (ref 4.0–10.5)

## 2016-02-26 MED ORDER — HEPARIN (PORCINE) IN NACL 100-0.45 UNIT/ML-% IJ SOLN
1400.0000 [IU]/h | INTRAMUSCULAR | Status: DC
  Filled 2016-02-26: qty 250

## 2016-02-26 NOTE — Progress Notes (Addendum)
ANTICOAGULATION CONSULT NOTE  Pharmacy Consult for Heparin Indication: atrial fibrillation  Allergies  Allergen Reactions  . Bee Venom Swelling  . Doxycycline     Hoarseness / throat irritation  . Latex Rash  . Penicillins Rash    Patient Measurements: Height: 5\' 2"  (157.5 cm) Weight: 225 lb 15.5 oz (102.5 kg) IBW/kg (Calculated) : 50.1 Heparin Dosing Weight: 76 kg  Vital Signs: Temp: 97.6 F (36.4 C) (06/25 0731) Temp Source: Oral (06/25 0731) BP: 133/51 mmHg (06/25 0800) Pulse Rate: 71 (06/25 0805)  Labs:  Recent Labs  02/23/16 1525  02/24/16 0213 02/25/16 0308 02/25/16 0354 02/26/16 0238  HGB  --   < > 7.5*  --  7.2* 7.3*  HCT  --   --  25.6*  --  24.9* 25.9*  PLT  --   --  162  --  161 174  HEPARINUNFRC 0.43  --  0.46 <0.10*  --   --   CREATININE  --   --  2.15*  --  1.90* 2.03*  < > = values in this interval not displayed.  Estimated Creatinine Clearance: 27.7 mL/min (by C-G formula based on Cr of 2.03).   Assessment: 75 year old female with history of atrial fibrillation on Pradaxa PTA now transitioned to IV heparin.   Pt was previously having bleeding around PICC, PICC was removed and bleeding resolved there. Then she began having some blood around her gums and oropharynx area. Heparin gtt [place] and will now be resumed. Bleeding in mouth has lessened. Hgb 7.3, plts wnl.   Goal of Therapy:  Heparin level 0.3-0.7 units/ml Monitor platelets by anticoagulation protocol: Yes   Plan:  Resume heparin at 1400 units/hr Daily HL, CBC Monitor s/sx bleeding closely Check level this evening    Agapito GamesAlison Ralphael Southgate, PharmD, BCPS Clinical Pharmacist 02/26/2016 9:06 AM     Addendum Heparin never restarted due to hematuria Labs dc'd F/u plan in AM   Baldemar FridayMasters, Christapher Gillian M  02/26/2016 2:17 PM

## 2016-02-26 NOTE — Progress Notes (Signed)
PULMONARY / CRITICAL CARE MEDICINE   Name: Sandra AdasRita Turner MRN: [id number] DOB: 07/01/1941    ADMISSION DATE:  02/13/2016 CONSULTATION DATE:  02/16/2016  REFERRING MD:  Christy GentlesEstela Acosta, M.D. / [facility]  CHIEF COMPLAINT:  Shortness of breath  HISTORY OF PRESENT ILLNESS:  75 year old female well known to the [place] pulmonary and critical care service who comes to [facility] on 02/09/2016 in the setting of dyspnea. She was admitted that day and was noted to have acute kidney injury and a low hemoglobin value. She was admitted by the hospitalist service and nephrology was consulted. She was administered packed red blood cells and Lasix was given as well. Despite these measures she had ongoing respiratory distress with hypoxemia. She ultimately required intubation on June 15. Pulmonary and critical care medicine in [place] was asked to take the patient on transfer because of no pulmonary and critical care support at [facility].  SUBJECTIVE:   Decreased oral bleeding.  RT reports pt weaning on PSV 10/5.  Has not made progress with weaning in the last few days.  D14 on vent.    VITAL SIGNS: BP 133/51 mmHg  Pulse 71  Temp(Src) 97.6 F (36.4 C) (Oral)  Resp 16  Ht 5\' 2"  (1.575 m)  Wt 225 lb 15.5 oz (102.5 kg)  BMI 41.32 kg/m2  SpO2 94%  HEMODYNAMICS:    VENTILATOR SETTINGS: Vent Mode:  [-] PRVC FiO2 (%):  [40 %] 40 % Set Rate:  [15 bmp] 15 bmp Vt Set:  [440 mL] 440 mL PEEP:  [5 cmH20] 5 cmH20 Plateau Pressure:  [17 cmH20-22 cmH20] 17 cmH20  INTAKE / OUTPUT:  Intake/Output Summary (Last 24 hours) at 02/26/16 0843 Last data filed at 02/26/16 0805  Gross per 24 hour  Intake   1610 ml  Output    800 ml  Net    810 ml   PHYSICAL EXAMINATION: General: chronically ill appearing female in NAD on vent Integument: Warm & dry. bilateral upper ext's with ecchymosis, multiple dressings, Scaling dermis in lower extremities.  HEENT: Endotracheal tube in place, supple, no  JVD Cardiovascular: irregular rhythm regular rate, Anasarca, +mumur Pulmonary: coarse, diminished bibasilarly. Symmetric chest wall rise on ventilator Abdomen: Soft. Normal bowel sounds x 4. Protuberant/obese Musculoskeletal: Normal bulk. No joint deformity or effusion appreciated Neurological: PERRL, follows commands, generalized weakness, MAE  LABS:  BMET  Recent Labs Lab 02/24/16 0213 02/25/16 0354 02/26/16 0238  NA 142 143 144  K 3.5 4.6 4.0  CL 97* 99* 98*  CO2 36* 30 37*  BUN 128* 131* 138*  CREATININE 2.15* 1.90* 2.03*  GLUCOSE 121* 81 120*   Electrolytes  Recent Labs Lab 02/21/16 0209  02/23/16 0327 02/24/16 0213 02/25/16 0354 02/26/16 0238  CALCIUM 7.9*  < > 8.7* 8.8* 8.9 9.0  MG 2.3  --  2.5*  --  2.7*  --   PHOS 4.5  --  4.5  --  4.5  --   < > = values in this interval not displayed. CBC  Recent Labs Lab 02/24/16 0213 02/25/16 0354 02/26/16 0238  WBC 7.8 6.1 6.2  HGB 7.5* 7.2* 7.3*  HCT 25.6* 24.9* 25.9*  PLT 162 161 174   Coag's No results for input(s): APTT, INR in the last 168 hours.  Sepsis Markers  Recent Labs Lab 02/20/16 0340  PROCALCITON 0.72   ABG  Recent Labs Lab 02/21/16 0400 02/23/16 0442 02/24/16 0353  PHART 7.469* 7.496* 7.613*  PCO2ART 49.8* 48.6* 36.7  PO2ART  90.5 66.0* 86.0   Liver Enzymes No results for input(s): AST, ALT, ALKPHOS, BILITOT, ALBUMIN in the last 168 hours.  Cardiac Enzymes No results for input(s): TROPONINI, PROBNP in the last 168 hours. Glucose  Recent Labs Lab 02/25/16 1149 02/25/16 1558 02/25/16 1957 02/25/16 2348 02/26/16 0350 02/26/16 0732  GLUCAP 101* 117* 113* 117* 131* 128*   Imaging Dg Chest Port 1 View  02/26/2016  CLINICAL DATA:  Respiratory failure. EXAM: PORTABLE CHEST 1 VIEW COMPARISON:  02/24/2016 FINDINGS: Endotracheal tube remains with the tip approximately 3 cm above the carina. Nasogastric tube extends into the stomach. Lungs continue to show bilateral airspace  disease, slightly more prominently in the right lung. Aeration of the left lung appears mildly improved. Findings are consistent with edema/ARDS. Bilateral pleural effusions are present, appearing similar to the prior study. The heart size and mediastinal contours are stable. Stable radiographic appearance of prosthetic aortic valve and left atrial appendage clip IMPRESSION: Similar pattern of airspace disease/ air ds with slight improvement in aeration of the left lung. Electronically Signed   By: Irish Lack M.D.   On: 02/26/2016 08:05    STUDIES:  Echo (03/16/15): LV EF: 55%-60% TTE (03/16/15): LV EF 55-60, mildly calcified mitral valve, bioprosthetic aortic valve functioning well but poorly visualized, right ventricle moderately dilated, RVSP estimate 44 mmHg, right atrium severely dilated, mild to moderate tricuspid regurgitation PFT (04/15/15): FVC 0.96 L (36%) FEV1 0.74 L (36%) FEV1/FVC 0.76 no bronchodilator response TLC 2.90 L (61%) DLCO uncorrected 40% CXR PA/LAT 6/11: Silhouetting of bilateral hemidiaphragms with lower lobe opacification suggestive of pleural effusions. PORT CXR 6/15: Patchy bilateral alveolar opacities now involving the upper lung zones. Improved visualization of hemidiaphragms bilaterally. Endotracheal tube & orogastric tube in good position. Right PICC line with tip in SVC. Blunting of right costocardiac angle suggestive of pleural effusion. CT Abdomen Pelvis 6/16: Moderate right and small left pleural effusions, with patchy bibasilar airspace opacities, which may reflect pneumonia or pulmonary edema. Small amount of ascites within the abdomen and pelvis, some of which is high in attenuation, of uncertain significance. Vague nonspecific soft tissue inflammation about the pancreas may simply reflect underlying edema. Would correlate with the patient's symptoms. Diffuse soft tissue edema tracking about the abdomen and pelvis, concerning for anasarca. Scattered diverticulosis  along the sigmoid colon without evidence of diverticulitis. Mild cardiomegaly. Diffuse coronary artery calcifications seen.  MICROBIOLOGY: MRSA PCR 6/11:  Positive Blood Ctx x2 6/15 >> negative Tracheal Asp Ctx 6/15 >> negative Urine Strep Ag 6/15:  Negative   ANTIBIOTICS: Aztreonam 6/15 >> 6/19 Vancomycin 6/15 >> 6/22 Cefepime 6/19>> 6/22  SIGNIFICANT EVENTS: 6/11 - Admission to [facility] 6/11 - Transfused 2u PRBC 6/15 - Transfused 3u PRBC 6/15 - Intubation for respiratory failure and transferred to [facility] 6/17 - started on lasix gtt.  6/18 - weaning, CXR a little better. Had family meeting. Goals of care discussed. Made DNR.  6/20 - heparin gtt held with bleeding from prior picc line site 6/21 - restarted heparin, weaned 8 hours on PSV 6/23 - Self extubated, requested no trach  LINES/TUBES: OETT 7.5 6/15 >> 6/23, 6/23 >>  OGT 6/15 >> 6/23, 6/23 >>  FOLEY 6/11 >> R TL PICC 6/15 >> 6/19 PIV x2  DISCUSSION:  75 year old female with a past medical history significant for aortic valve replacement in 2016 complicated by recurrent episodes of respiratory failure with hypoxemia due to mucus plugging and underlying severe asthma who returned to the hospital on 02/16/2016 in the  setting of acute kidney injury, anemia, and acute respiratory failure with hypoxemia. She has a recent history of recurrent episodes of diastolic heart failure requiring multiple hospitalizations.  Working dx at this point is decompensated diastolic HF c/b ALI which was either the result of TRALI or HCAP (NOS). She has been on abx, with addition of lasix gtt she is making some improvement both clinically and radiographically. At this point the plan will re-assess for extubation daily. Have discussed w/ family and made her DNR. Pt requested no trach / PEG.    ASSESSMENT / PLAN:  PULMONARY A: Acute Respiratory Failure with Hypoxia - Volume overload vs TRALI vs HCAP. ARDS  Possible HCAP H/O Asthma H/O  Tracheomalacia  P:   PRVC 8 cc/kg Wean PEEP / FiO2 for sats > 92% Daily SBT & WUA Trend CXR Xopenex & Atrovent Nebs q6hr Conversation with patient, when she was alert and interactive, seemed appropriate, questioned about trach/peg and she stated no.  Reassess daily for extubation with no intention to reintubate   CARDIOVASCULAR A:  Chronic Diastolic CHF H/O Atrial fibrillation - S/P MAZE 2016 H/O Aortic Stenosis - S/P bioprosthetic aortic valve. DNR  P:  Telemetry monitoring  Vitals per unit protocol Hold Pradaxa for anemia, will restart when able given AVR HOLD Heparin gtt additional 24 hours with hematuria per   Decrease Diltizem to 30 mg VT q8hr DNR  RENAL A:   Acute on Chronic Renal Failure - Improving with diuresis (baseline creatinine appears to be in to 2 to 2.3 range) Borderline hyperkalemia  Metabolic Akalosis - contraction alkalosis   P:   Trend UOP   Monitor electrolytes & renal function daily Replace electrolytes as needed  GASTROINTESTINAL A:   Severe protein calorie malnutrition   P:   Pepcid IV q12hr Cont tube feeds  HEMATOLOGIC A:   Chronic Anticoagulation - Pradaxa for A fib. Anemia - No obvious bleeding source. S/P 2u PRBC 6/11 & 3u PRBC 6/15.  hgb drifting. Got 1 unit PRBC 6/18 Thrombocytopenia  P:  Trend CBC SCDs Transfuse for Hgb <7.0  Ferrous sulfate PT TID  INFECTIOUS A:   HCAP P:   Completed abx as above Monitor fever curve / WBC trend  ENDOCRINE A:   Hyperglycemia - Mild. P:   Monitor glucose on daily labs.  NEUROLOGIC A:   Sedation on Ventilator. P:   RASS goal:  0   Fentanyl gtt with PRN versed  FAMILY:  - Updates:  On prior conversation, she elected no trach/peg.  Concerned she is not making progress with weaning.  We are at D14 of intubation.  Will likely need further meeting with family regarding plan of care given lack of progress.    - Inter-disciplinary family meet or Palliative Care meeting due by:   Ongoing.    Canary Brim, NP-C [facility] Pgr: [phone] or if no answer [phone] 02/26/2016, 8:43 AM   Attending Note:  I have examined patient, reviewed labs, studies and notes. I have discussed the case with B Ollis, and I agree with the data and plans as amended above. 75 year old woman with prosthetic aortic valve, recurrent acute on chronic respiratory failure in the setting of healthcare associated pneumonia, MODS, acute on chronic diastolic CHF and massive volume overload. She also has a history of reported asthma. She was able to do some PSV 10 yesterday, remains marginal on SBT's. On my evaluation she sedated, intubated. She has bilateral coarse breath sounds. Multiple scattered ecchymoses and skin  tears. Massive generalized edema. She has some hematuria in foley bag. We will continue her antibiotics, aggressive diuresis. Assess her for spontaneous breathing. She has indicated that she would not want to be chronically ventilated, would not want tracheostomy. I suspect we are headed for one-way extubation with comfort if she fails. Will need to discuss w family this week. We will hold off on heparin given the hematuria, would like to restart when possible.   Independent critical care time is 35 minutes.   Levy Pupaobert Illyana Schorsch, MD, PhD 02/26/2016, 1:17 PM [facility] [phone] or if no answer [phone]

## 2016-02-27 ENCOUNTER — Inpatient Hospital Stay (HOSPITAL_COMMUNITY): Payer: Medicare Other

## 2016-02-27 DIAGNOSIS — I5023 Acute on chronic systolic (congestive) heart failure: Secondary | ICD-10-CM

## 2016-02-27 LAB — BASIC METABOLIC PANEL
Anion gap: 10 (ref 5–15)
BUN: 149 mg/dL — ABNORMAL HIGH (ref 6–20)
CHLORIDE: 101 mmol/L (ref 101–111)
CO2: 33 mmol/L — AB (ref 22–32)
CREATININE: 2.06 mg/dL — AB (ref 0.44–1.00)
Calcium: 8.9 mg/dL (ref 8.9–10.3)
GFR calc non Af Amer: 23 mL/min — ABNORMAL LOW (ref >60)
GFR, EST AFRICAN AMERICAN: 26 mL/min — AB (ref >60)
GLUCOSE: 117 mg/dL — AB (ref 65–99)
Potassium: 3.9 mmol/L (ref 3.5–5.1)
Sodium: 144 mmol/L (ref 135–145)

## 2016-02-27 LAB — CBC
HCT: 24.3 % — ABNORMAL LOW (ref 36.0–46.0)
HEMOGLOBIN: 6.8 g/dL — AB (ref 12.0–15.0)
MCH: 25.9 pg — AB (ref 26.0–34.0)
MCHC: 28 g/dL — AB (ref 30.0–36.0)
MCV: 92.4 fL (ref 78.0–100.0)
Platelets: 172 10*3/uL (ref 150–400)
RBC: 2.63 MIL/uL — AB (ref 3.87–5.11)
RDW: 23.1 % — ABNORMAL HIGH (ref 11.5–15.5)
WBC: 4.6 10*3/uL (ref 4.0–10.5)

## 2016-02-27 LAB — GLUCOSE, CAPILLARY
GLUCOSE-CAPILLARY: 107 mg/dL — AB (ref 65–99)
GLUCOSE-CAPILLARY: 108 mg/dL — AB (ref 65–99)
Glucose-Capillary: 101 mg/dL — ABNORMAL HIGH (ref 65–99)
Glucose-Capillary: 120 mg/dL — ABNORMAL HIGH (ref 65–99)

## 2016-02-27 LAB — TYPE AND SCREEN
ABO/RH(D): A NEG
Antibody Screen: NEGATIVE

## 2016-02-27 MED ORDER — RACEPINEPHRINE HCL 2.25 % IN NEBU
0.5000 mL | INHALATION_SOLUTION | Freq: Once | RESPIRATORY_TRACT | Status: DC
  Filled 2016-02-27: qty 0.5

## 2016-02-27 MED ORDER — DEXAMETHASONE SODIUM PHOSPHATE 4 MG/ML IJ SOLN
4.0000 mg | Freq: Once | INTRAMUSCULAR | Status: DC
  Filled 2016-02-27: qty 1

## 2016-02-29 ENCOUNTER — Telehealth: Payer: Self-pay

## 2016-02-29 NOTE — Telephone Encounter (Signed)
On 02/29/2016 I received a death certificate from [employer] [employer] [employer] (original). The death certificate is for cremation. The patient is a patient of Doctor Tyson AliasFeinstein. The death certificate will be taken to [employer] this am for signature. On 03/01/2016 I received the death certificate back from Doctor Tyson AliasFeinstein. I got the death certificate ready and called the funeral home to let them know the death certificate is ready for pickup. I also faxed a copy to the funeral home per their request.

## 2016-02-29 NOTE — Telephone Encounter (Signed)
This note was entered by mistake. °

## 2016-03-03 NOTE — Progress Notes (Signed)
PULMONARY / CRITICAL CARE MEDICINE   Name: Ricci Paff MRN: [id number] DOB: 29-Mar-1941    ADMISSION DATE:  02/23/2016 CONSULTATION DATE:  02/16/2016  REFERRING MD:  Christy Gentles, M.D. / [facility]  CHIEF COMPLAINT:  Shortness of breath  HISTORY OF PRESENT ILLNESS:  75 year old female well known to the [place] pulmonary and critical care service who comes to [facility] on 02/11/2016 in the setting of dyspnea. She was admitted that day and was noted to have acute kidney injury and a low hemoglobin value. She was admitted by the hospitalist service and nephrology was consulted. She was administered packed red blood cells and Lasix was given as well. Despite these measures she had ongoing respiratory distress with hypoxemia. She ultimately required intubation on June 15. Pulmonary and critical care medicine in [place] was asked to take the patient on transfer because of no pulmonary and critical care support at [facility].  SUBJECTIVE:  Drop in hgb  VITAL SIGNS: BP 114/53 mmHg  Pulse 71  Temp(Src) 97.2 F (36.2 C) (Oral)  Resp 22  Ht  (1.575 m)  Wt 102.5 kg (225 lb 15.5 oz)  BMI 41.32 kg/m2  SpO2 97%  HEMODYNAMICS:    VENTILATOR SETTINGS: Vent Mode:  [-] PRVC FiO2 (%):  [40 %] 40 % Set Rate:  [15 bmp] 15 bmp Vt Set:  [440 mL] 440 mL PEEP:  [5 cmH20] 5 cmH20 Plateau Pressure:  [17 cmH20-33 cmH20] 33 cmH20  INTAKE / OUTPUT:  Intake/Output Summary (Last 24 hours) at 03/03/16 0555 Last data filed at 03-Mar-2016 0400  Gross per 24 hour  Intake 1746.5 ml  Output   1175 ml  Net  571.5 ml   PHYSICAL EXAMINATION: General: chronically ill appearing female in NAD on vent Integument: Warm & dry. bilateral upper ext's with ecchymosis HEENT: ett, jvd Cardiovascular: irregular rhythm regular rate, Anasarca, +mumur Pulmonary:ronchi , reduced Abdomen: Soft. Normal bowel sounds. Protuberant/obese Musculoskeletal:edema 3 Neurological: PERRL, follows commands,  generalized weakness, int agitation   LABS:  BMET  Recent Labs Lab 02/25/16 0354 02/26/16 0238 03-03-2016 0250  NA 143 144 144  K 4.6 4.0 3.9  CL 99* 98* 101  CO2 30 37* 33*  BUN 131* 138* 149*  CREATININE 1.90* 2.03* 2.06*  GLUCOSE 81 120* 117*   Electrolytes  Recent Labs Lab 02/21/16 0209  02/23/16 0327  02/25/16 0354 02/26/16 0238 2016/03/03 0250  CALCIUM 7.9*  < > 8.7*  < > 8.9 9.0 8.9  MG 2.3  --  2.5*  --  2.7*  --   --   PHOS 4.5  --  4.5  --  4.5  --   --   < > = values in this interval not displayed. CBC  Recent Labs Lab 02/25/16 0354 02/26/16 0238 03-Mar-2016 0250  WBC 6.1 6.2 4.6  HGB 7.2* 7.3* 6.8*  HCT 24.9* 25.9* 24.3*  PLT 161 174 172   Coag's No results for input(s): APTT, INR in the last 168 hours.  Sepsis Markers No results for input(s): LATICACIDVEN, PROCALCITON, O2SATVEN in the last 168 hours. ABG  Recent Labs Lab 02/21/16 0400 02/23/16 0442 02/24/16 0353  PHART 7.469* 7.496* 7.613*  PCO2ART 49.8* 48.6* 36.7  PO2ART 90.5 66.0* 86.0   Liver Enzymes No results for input(s): AST, ALT, ALKPHOS, BILITOT, ALBUMIN in the last 168 hours.  Cardiac Enzymes No results for input(s): TROPONINI, PROBNP in the last 168 hours. Glucose  Recent Labs Lab 02/26/16 0732 02/26/16 1224 02/26/16 1622 02/26/16 1947  02/26/16 2351 September 26, 2015 0406  GLUCAP 128* 129* 118* 117* 101* 107*   Imaging No results found.  STUDIES:  Echo (03/16/15): LV EF: 55%-60% TTE (03/16/15): LV EF 55-60, mildly calcified mitral valve, bioprosthetic aortic valve functioning well but poorly visualized, right ventricle moderately dilated, RVSP estimate 44 mmHg, right atrium severely dilated, mild to moderate tricuspid regurgitation PFT (04/15/15): FVC 0.96 L (36%) FEV1 0.74 L (36%) FEV1/FVC 0.76 no bronchodilator response TLC 2.90 L (61%) DLCO uncorrected 40% CXR PA/LAT 6/11: Silhouetting of bilateral hemidiaphragms with lower lobe opacification suggestive of pleural  effusions. PORT CXR 6/15: Patchy bilateral alveolar opacities now involving the upper lung zones. Improved visualization of hemidiaphragms bilaterally. Endotracheal tube & orogastric tube in good position. Right PICC line with tip in SVC. Blunting of right costocardiac angle suggestive of pleural effusion. CT Abdomen Pelvis 6/16: Moderate right and small left pleural effusions, with patchy bibasilar airspace opacities, which may reflect pneumonia or pulmonary edema. Small amount of ascites within the abdomen and pelvis, some of which is high in attenuation, of uncertain significance. Vague nonspecific soft tissue inflammation about the pancreas may simply reflect underlying edema. Would correlate with the patient's symptoms. Diffuse soft tissue edema tracking about the abdomen and pelvis, concerning for anasarca. Scattered diverticulosis along the sigmoid colon without evidence of diverticulitis. Mild cardiomegaly. Diffuse coronary artery calcifications seen.  MICROBIOLOGY: MRSA PCR 6/11:  Positive Blood Ctx x2 6/15 >> negative Tracheal Asp Ctx 6/15 >> negative Urine Strep Ag 6/15:  Negative   ANTIBIOTICS: Aztreonam 6/15 >> 6/19 Vancomycin 6/15 >> 6/22 Cefepime 6/19>> 6/22  SIGNIFICANT EVENTS: 6/11 - Admission to [facility] 6/11 - Transfused 2u PRBC 6/15 - Transfused 3u PRBC 6/15 - Intubation for respiratory failure and [facility]  6/17 - started on lasix gtt.  6/18 - weaning, CXR a little better. Had family meeting. Goals of care discussed. Made DNR.  6/20 - heparin gtt held with bleeding from prior picc line site 6/21 - restarted heparin, weaned 8 hours on PSV 6/23 - Self extubated, requested no trach  LINES/TUBES: OETT 7.5 6/15 >> 6/23, 6/23 >>  OGT 6/15 >> 6/23, 6/23 >>  FOLEY 6/11 >> R TL PICC 6/15 >> 6/19 PIV x2  DISCUSSION:  75 year old female with a past medical history significant for aortic valve replacement in 2016 complicated by recurrent episodes of  respiratory failure with hypoxemia due to mucus plugging and underlying severe asthma who returned to the hospital on 02/16/2016 in the setting of acute kidney injury, anemia, and acute respiratory failure with hypoxemia. She has a recent history of recurrent episodes of diastolic heart failure requiring multiple hospitalizations.  Working dx at this point is decompensated diastolic HF c/b ALI which was either the result of TRALI or HCAP (NOS). She has been on abx, with addition of lasix gtt she is making some improvement both clinically and radiographically. At this point the plan will re-assess for extubation daily. Have discussed w/ family and made her DNR. Pt requested no trach / PEG.    ASSESSMENT / PLAN:  PULMONARY A: Acute Respiratory Failure with Hypoxia - Volume overload vs TRALI vs HCAP. ARDS  Possible HCAP H/O Asthma H/O Tracheomalacia  P:   Daily SBT cpap 5 ps5, likley will need PS increase Her prognosis is bad to extubate successfully, will discuss option comfort with family asap Trend CXR for rt changes Xopenex & Atrovent Nebs q6hr Conversation with patient, when she was alert and interactive, seemed appropriate, questioned about trach/peg and she stated  no. Neg blance as able Add chest pt for atx rt thora will not help her wean or alter her outcomr  CARDIOVASCULAR A:  Chronic Diastolic CHF H/O Atrial fibrillation - S/P MAZE 2016 H/O Aortic Stenosis - S/P bioprosthetic aortic valve. DNR  P:  Telemetry monitoring  Vitals per unit protocol Hold Pradaxa for anemia, will restart when able given AVR HOLD Heparin gtt additional with drop hgb concerns Diltizem to 30 mg VT q8hr DNR  RENAL A:   Acute on Chronic Renal Failure - Improving with diuresis (baseline creatinine appears to be in to 2 to 2.3 range) Borderline hyperkalemia  Metabolic Akalosis - contraction alkalosis   P:   Trend UOP   Lasix consider in next 24 hours HD not wished and would not change  outcome  GASTROINTESTINAL A:   Severe protein calorie malnutrition   P:   Pepcid IV q12hr Cont tube feeds  HEMATOLOGIC A:   Chronic Anticoagulation - Pradaxa for A fib. Anemia - No obvious bleeding source. S/P 2u PRBC 6/11 & 3u PRBC 6/15.  hgb drifting. Got 1 unit PRBC 6/18 Thrombocytopenia  P:  Trend CBC at noon SCDs Transfuse for Hgb <7.0 if repeat hgb down and goals of care are are to continue Lasix also may help hemococnetrate Ferrous sulfate PT TID - dc in icu  INFECTIOUS A:   HCAP P:   Follow fever curve  ENDOCRINE A:   Hyperglycemia - Mild. P:   Monitor glucose on daily labs.  NEUROLOGIC A:   Sedation on Ventilator. P:   RASS goal:  0   Fentanyl gtt with PRN versed  FAMILY:  - Updates:  She needs cofort care. Will d.w family   - Inter-disciplinary family meet or Palliative Care meeting due by:  Ongoing.   Cmm time 30 min   Mcarthur Rossettianiel J. Tyson AliasFeinstein, MD, FACP Pgr: [phone] [facility]

## 2016-03-03 NOTE — Accreditation Note (Signed)
o Restraints reported to CMS  Pursuant to regulation 482.13 (G) (3) use of restraints was logged and CMS was notified via email on 06.28.2017 at [phone] by Rosine DoorAngus Yaremi Stahlman, RN, Patient Safety and Accreditation.

## 2016-03-03 NOTE — Procedures (Signed)
Extubation Procedure Note  Patient Details:   Name: Sandra Turner DOB: Jul 21, 1941 MRN: [id number]   Airway Documentation:     Evaluation  O2 sats: stable throughout Complications: No apparent complications Patient did tolerate procedure well. Bilateral Breath Sounds: Diminished, Clear   Yes   Patient extubated to 4lnc. Vital signs stable at this time. No complications. RN at bedside. RT will continue to monitor.  Ave Filterdkins, Kieon Lawhorn Williams 02/19/2016, 12:45 PM

## 2016-03-03 NOTE — Progress Notes (Signed)
   01/15/16 1600  Clinical Encounter Type  Visited With Patient  Visit Type Death  Spiritual Encounters  Spiritual Needs Prayer   Chaplain prayed for deceased patient.  Sandra Turner 03/23/2016 4:31 PM

## 2016-03-03 NOTE — Progress Notes (Signed)
Pt belongings included clothing, copper rings, and home medications. One medication, Tramadol, was counted with Schuyler Amorarla Fulk RN. Twenty eight 50mg  tabs were counted and wasted. The other medication, Metolazone, was also wasted. All other items were sent to the morgue with the body.

## 2016-03-03 NOTE — Progress Notes (Signed)
ANTICOAGULATION CONSULT NOTE  Pharmacy Consult for Heparin Indication: atrial fibrillation  Allergies  Allergen Reactions  . Bee Venom Swelling  . Doxycycline     Hoarseness / throat irritation  . Latex Rash  . Penicillins Rash    Patient Measurements: Height: 5\' 2"  (157.5 cm) Weight: 227 lb 8.2 oz (103.2 kg) IBW/kg (Calculated) : 50.1 Heparin Dosing Weight: 76 kg  Vital Signs: Temp: 97.2 F (36.2 C) (06/26 0409) Temp Source: Oral (06/26 0409) BP: 112/56 mmHg (06/26 0802) Pulse Rate: 79 (06/26 0802)  Labs:  Recent Labs  02/25/16 0308  02/25/16 0354 02/26/16 0238 02/08/2016 0250  HGB  --   < > 7.2* 7.3* 6.8*  HCT  --   --  24.9* 25.9* 24.3*  PLT  --   --  161 174 172  HEPARINUNFRC <0.10*  --   --   --   --   CREATININE  --   --  1.90* 2.03* 2.06*  < > = values in this interval not displayed.  Estimated Creatinine Clearance: 27.4 mL/min (by C-G formula based on Cr of 2.06).   Assessment: 75 year old female with history of atrial fibrillation on Pradaxa PTA now transitioned to IV heparin.   Pt was previously having bleeding around PICC, PICC was removed and bleeding resolved there. Then she began having some blood around her gums and oropharynx area. Heparin gtt was held from [place] and will now be resumed. Bleeding in mouth has lessened. Hgb 6.8, plts wnl.   Goal of Therapy:  Heparin level 0.3-0.7 units/ml Monitor platelets by anticoagulation protocol: Yes   Plan:  Holding heparin 2/2 Hgb drop and hematuria Daily CBC Monitor s/sx bleeding closely  Arlean Hoppingorey M. Newman PiesBall, PharmD, BCPS Clinical Pharmacist Pager [phone]  02/11/2016 9:15 AM

## 2016-03-03 NOTE — Progress Notes (Signed)
Pt expired at 1348. No breath or heart sounds auscultated, no femoral pulse felt or dopplared, pupils fixed and dilated. Confirmed by Edison PaceAshley Jarrell RN. No family at bedside but RN and Nurse extern at bedside at time of death. Family notified.

## 2016-03-03 NOTE — Progress Notes (Signed)
Wasted 200mL fentanyl with Tory EmeraldMichelle Toler RN

## 2016-03-03 NOTE — Progress Notes (Signed)
eLink Physician-Brief Progress Note Patient Name: Sandra Turner DOB: 08-19-1941 MRN: [id number]   Date of Service  02/26/2016  HPI/Events of Note  Notified by bedside nurse of worsening anemia. Hemoglobin 6.8 down from 7.3. Heparin still on hold. Borderline Blood pressure.   eICU Interventions  1. Stat type and screen 2. Hemoglobin/hematocrit at noon today      Intervention Category Intermediate Interventions: Bleeding - evaluation and treatment with blood products  Lawanda CousinsJennings Michal Strzelecki 02/10/2016, 3:21 AM

## 2016-03-03 NOTE — Progress Notes (Signed)
Spoke w/family.  Pt prognosis grim at best.  Best case scenario would be hospice if she were to survive.  Nothing else for use to offer her.   Plan Extubate Continue medical support If declines we would transition to comfort.   Simonne MartinetPeter E Tamura Lasky ACNP-BC [facility] Pager # [phone] OR # [phone] if no answer

## 2016-03-03 NOTE — Progress Notes (Signed)
CSW consulted to assist with locating family. Patient's brother is Renne MuscaBarney Swenson [phone]; Patient's niece is Westley HummerCharlene [phone]; Patient's niece Speedy is [phone]. CSW spoke with Patient's niece Westley HummerCharlene via T/C who reports that [place] HavenBarney is coming to the hospital today. CSW signing off. Please contact if new need(s) arise.        Lance MussAshley Gardner,MSW, LCSW [facility]M Clinical Social Worker [phone]

## 2016-03-03 DEATH — deceased

## 2016-03-09 ENCOUNTER — Ambulatory Visit: Payer: Medicare Other | Admitting: Pulmonary Disease

## 2016-04-03 NOTE — Discharge Summary (Signed)
NAME:  TIMICA, MANES                ACCOUNT NO.:  [id number]  MEDICAL RECORD NO.:  [id number]  LOCATION:                                 FACILITY:  PHYSICIAN:  Nelda Bucks, MD DATE OF BIRTH:  [birth year]  DATE OF ADMISSION:  02/16/2016 DATE OF DISCHARGE:                              DISCHARGE SUMMARY   DEATH SUMMARY  This is a 75 year old female with known history of Lyme disease, admitted to the Critical Care Service who was transferred from the [facility] on February 12, 2016, __________ day and was noted to have acute kidney injury, low hemoglobin.  She was admitted by the Hospitalist Service.  Nephrology was consulted.  She was admitted for anemia with packed red blood cell transfusion, so Lasix was given. Despite all these, she was requiring oxygenation, she required intubation on February 16, 2016 __________ she was transferred to [facility], she had a drop in her hemoglobin. Her hospitalization was significant for an echo on March 15, 2016, showed ejection fraction of 55-60% on __________ with some right ventricular pressures of 64 with some minor tricuspid regurgitation, PFTs with forced vital capacity of 0.96 L and FEV1 was 36%.  CT scan of the abdomen and pelvis on 06/16 with moderate right and small left pleural effusions with patchy bibasilar airspace opacities, may reflect pneumonia.  Cultures were positive for MRSA screen and negative otherwise.  Antibiotics were utilized, aztreonam, vanc and cefepime. Significant events included being admitted and __________ on February 12, 2016, transfused with 2 units of blood and then 3 units of blood on the February 17, 2016.  She required intubation on February 16, 2016.  Transferred to [facility], drip started on February 18, 2016, __________ on February 19, 2016.  Heparin drip was held secondary to bleeding from prior PICC line site.  Heparin was restarted on the 21st. __________ x-ray on  March 25, 2016, and they refused intubation __________.  The patient is being treated for acute respiratory failure with hypoxia secondary to volume overload versus __________ versus HCAP. She is being treated for ARDS.  Comfort care was in need secondary to lack of progress, __________ was held and re-verified with deep __________ comfort care was provided.  The patient expired.  FINAL DIAGNOSES UPON DEATH: 1. Volume overload. 2. Health care associated pneumonia. 3. History of asthma. 4. Chronic diastolic heart failure. 5. History of atrial fibrillation status post maze procedure. 6. History of aortic stenosis status post bioprosthetic aortic valve     replacement. 7. DNR status. 8. Acute on chronic renal failure. 9. Anemia transferred. 10.Thrombocytopenia.     Nelda Bucks, MD   ______________________________ Nelda Bucks, MD    DJF/MEDQ  D:  03/26/2016  T:  03/27/2016  Job:  [id number]

## 2017-02-19 IMAGING — US US RENAL
1 series · 14 of 25 positions shown · non-contrast
Comparison: None.

CLINICAL DATA: 73-year-old female with acute renal failure.

EXAM:
RENAL / URINARY TRACT ULTRASOUND COMPLETE

[Series 1: us renal · 0.26mm/px · 14 of 39 slices shown]
[im 1/39]
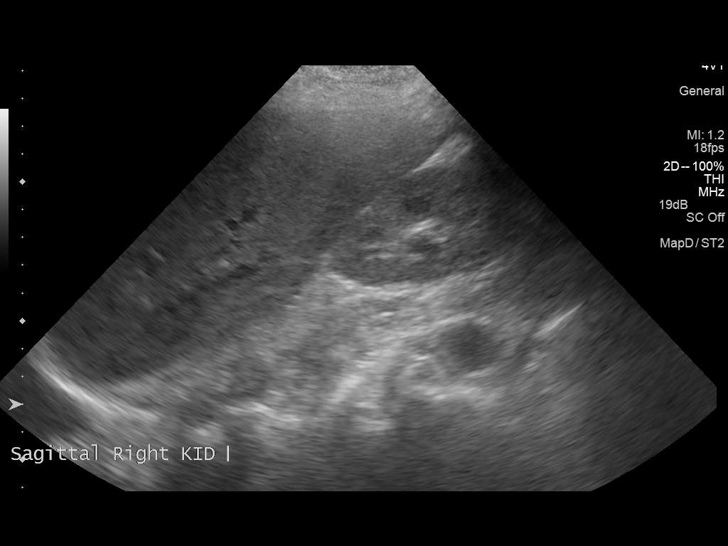
[im 4/39]
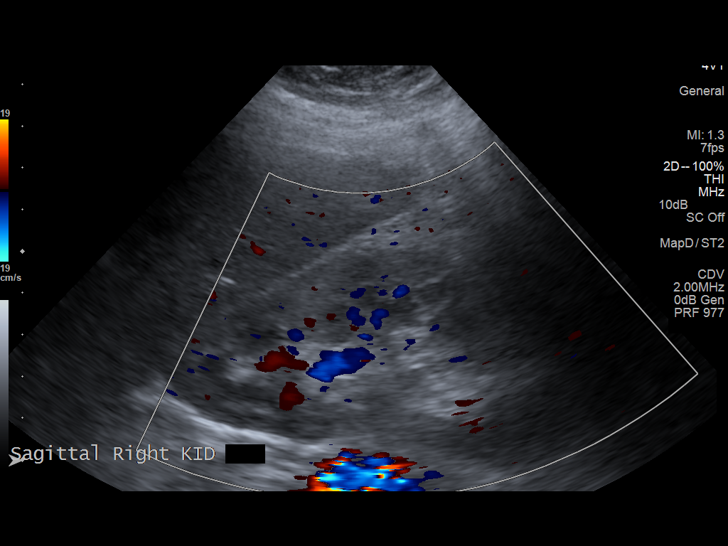
[im 7/39]
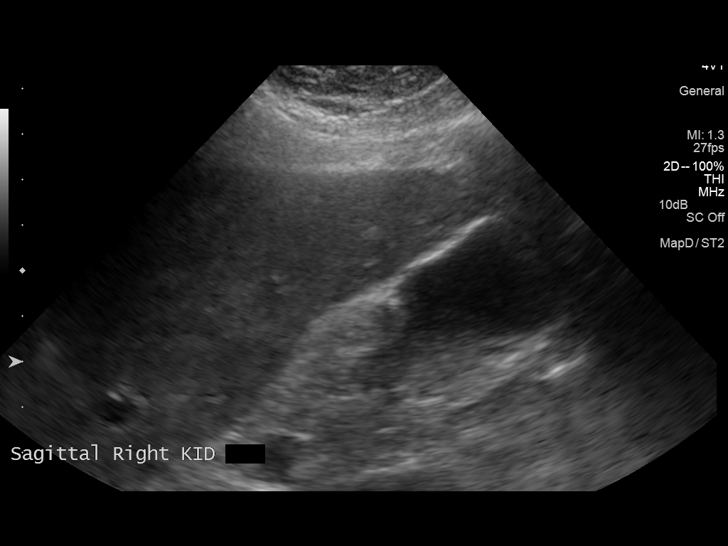
[im 10/39]
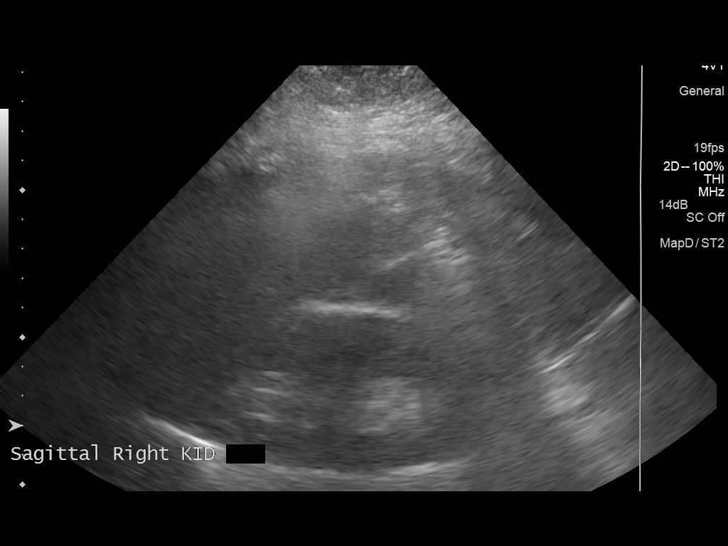
[im 13/39]
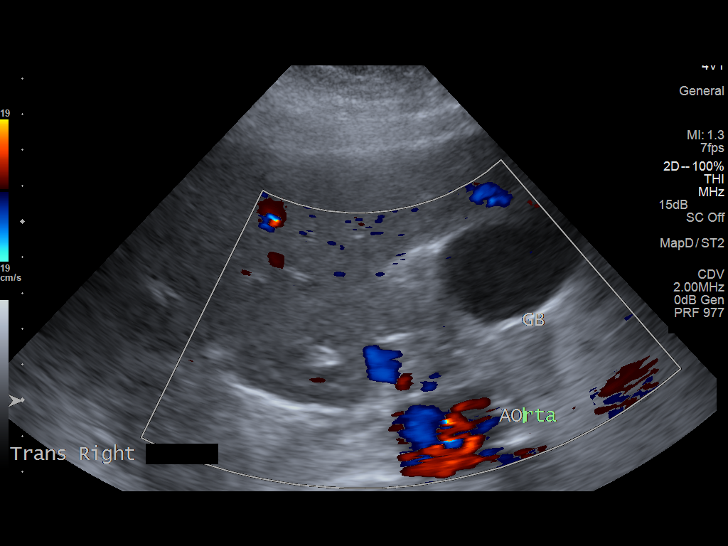
[im 15/39]
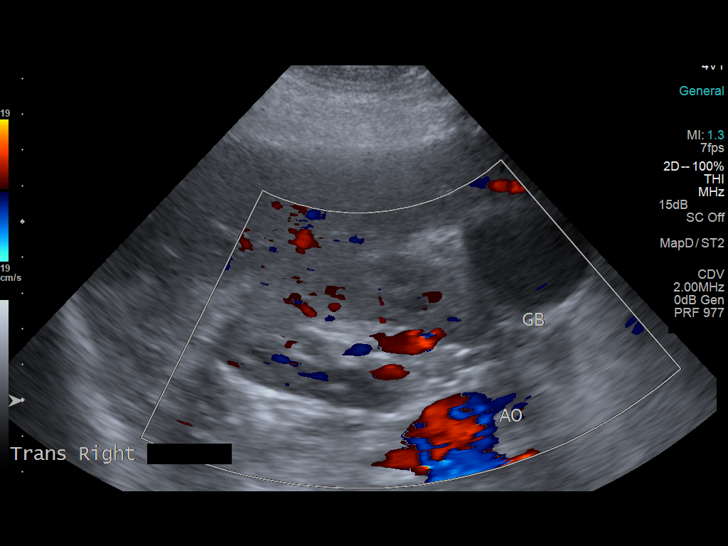
[im 18/39]
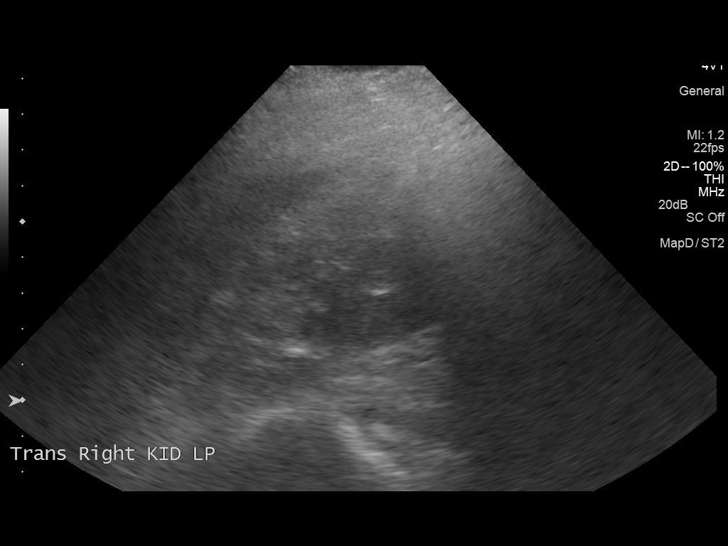
[im 21/39]
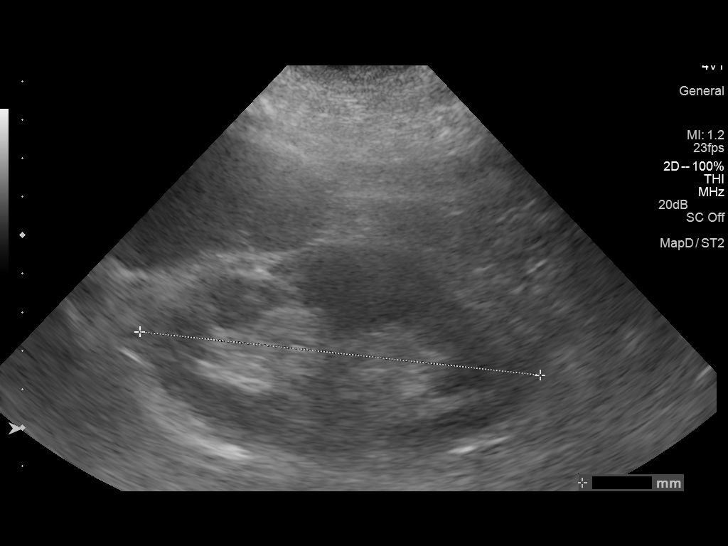
[im 24/39]
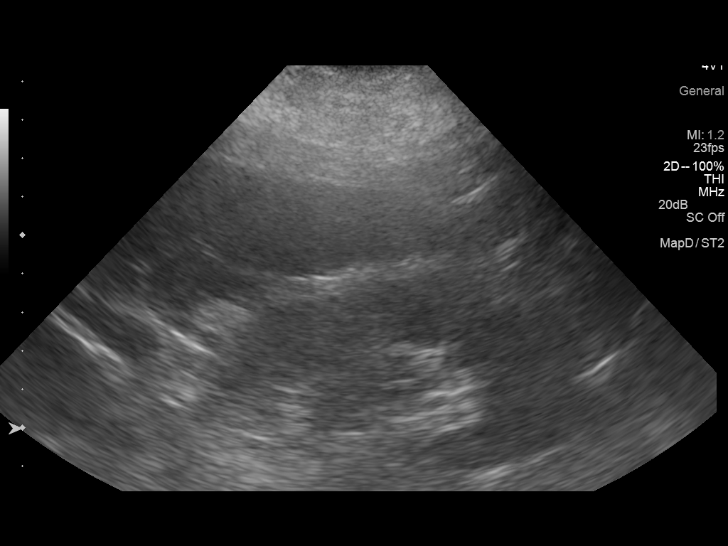
[im 26/39]
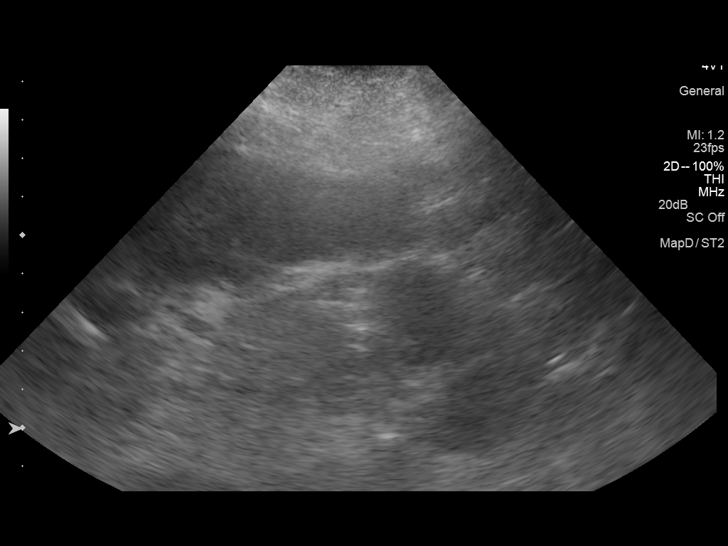
[im 29/39]
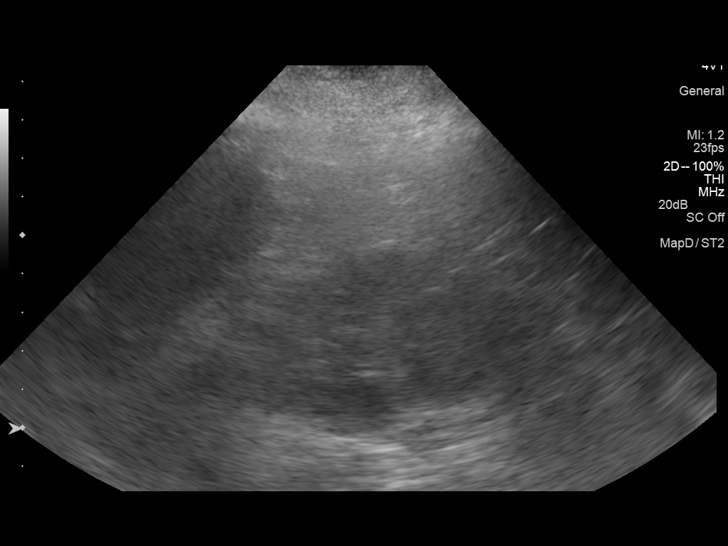
[im 32/39]
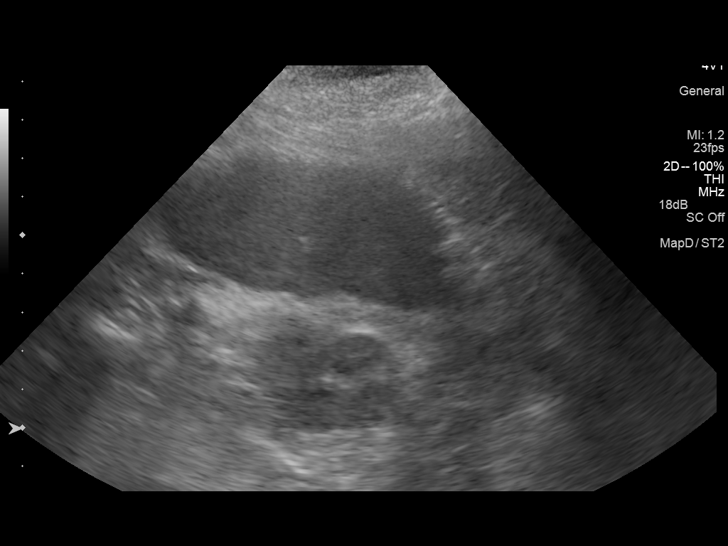
[im 35/39]
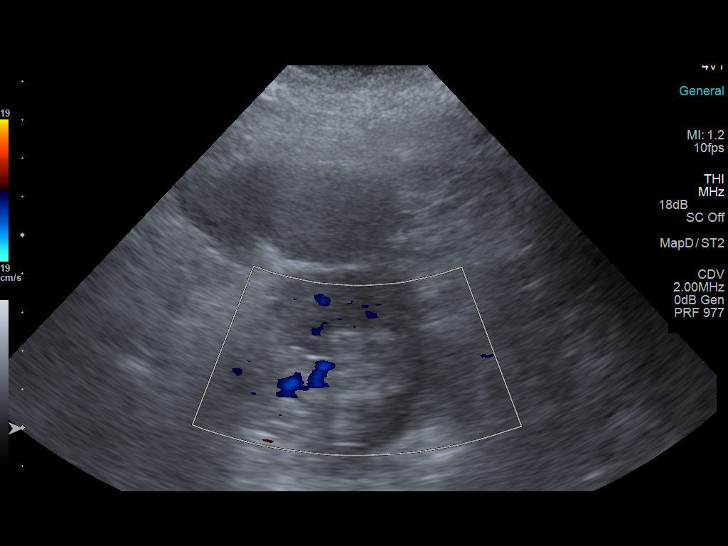
[im 39/39]
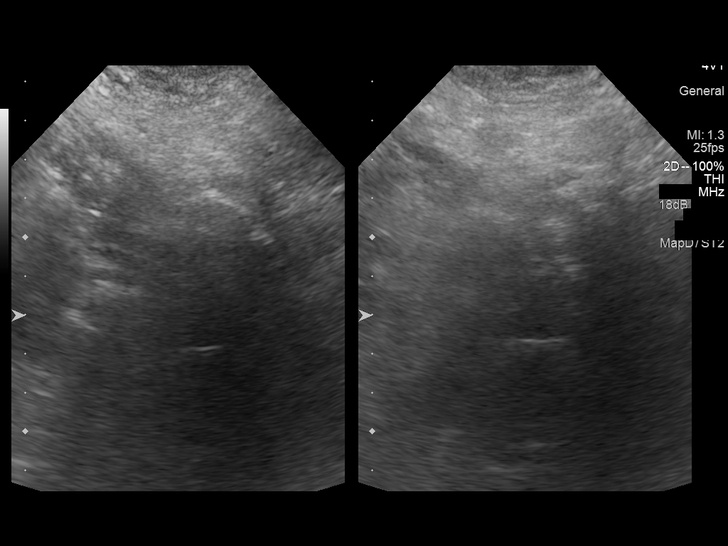

[14 of 25 positions shown; findings below may reference images not displayed]

FINDINGS: Right Kidney:

Length: 10.6 cm. Echogenicity is upper limits of normal. No mass or
hydronephrosis visualized.

Left Kidney:

Length: 10.5 cm. Echogenicity is upper limits of normal. No mass or
hydronephrosis visualized.

Bladder:

A Foley catheter is identified and a collapsed bladder.
IMPRESSION: Upper limits of normal renal echogenicity may represent medical
renal disease.

No evidence of hydronephrosis.
